# Patient Record
Sex: Female | Born: 1945 | Race: White | Hispanic: No | Marital: Single | Smoking: Never smoker
Health system: Southern US, Community
[De-identification: ages and names within clinical notes are randomized; demographics above are authoritative.]

## PROBLEM LIST (undated history)

## (undated) DIAGNOSIS — Z806 Family history of leukemia: Secondary | ICD-10-CM

## (undated) DIAGNOSIS — N83209 Unspecified ovarian cyst, unspecified side: Secondary | ICD-10-CM

## (undated) DIAGNOSIS — Z860100 Personal history of colon polyps, unspecified: Secondary | ICD-10-CM

## (undated) DIAGNOSIS — N952 Postmenopausal atrophic vaginitis: Secondary | ICD-10-CM

## (undated) DIAGNOSIS — K219 Gastro-esophageal reflux disease without esophagitis: Secondary | ICD-10-CM

## (undated) DIAGNOSIS — Z8719 Personal history of other diseases of the digestive system: Secondary | ICD-10-CM

## (undated) DIAGNOSIS — Z803 Family history of malignant neoplasm of breast: Secondary | ICD-10-CM

## (undated) DIAGNOSIS — M1990 Unspecified osteoarthritis, unspecified site: Secondary | ICD-10-CM

## (undated) DIAGNOSIS — C569 Malignant neoplasm of unspecified ovary: Secondary | ICD-10-CM

## (undated) DIAGNOSIS — I10 Essential (primary) hypertension: Secondary | ICD-10-CM

## (undated) DIAGNOSIS — K589 Irritable bowel syndrome without diarrhea: Secondary | ICD-10-CM

## (undated) DIAGNOSIS — F419 Anxiety disorder, unspecified: Secondary | ICD-10-CM

## (undated) DIAGNOSIS — K5792 Diverticulitis of intestine, part unspecified, without perforation or abscess without bleeding: Secondary | ICD-10-CM

## (undated) HISTORY — DX: Personal history of colonic polyps: Z86.010

## (undated) HISTORY — DX: Personal history of other diseases of the digestive system: Z87.19

## (undated) HISTORY — PX: ABDOMINAL HYSTERECTOMY: SHX81

## (undated) HISTORY — DX: Unspecified ovarian cyst, unspecified side: N83.209

## (undated) HISTORY — PX: LEEP: SHX91

## (undated) HISTORY — DX: Irritable bowel syndrome, unspecified: K58.9

## (undated) HISTORY — DX: Postmenopausal atrophic vaginitis: N95.2

## (undated) HISTORY — DX: Unspecified osteoarthritis, unspecified site: M19.90

## (undated) HISTORY — PX: OTHER SURGICAL HISTORY: SHX169

## (undated) HISTORY — DX: Family history of malignant neoplasm of breast: Z80.3

## (undated) HISTORY — PX: CHOLECYSTECTOMY: SHX55

## (undated) HISTORY — DX: Personal history of colon polyps, unspecified: Z86.0100

## (undated) HISTORY — PX: OOPHORECTOMY: SHX86

## (undated) HISTORY — DX: Family history of leukemia: Z80.6

## (undated) HISTORY — PX: BREAST BIOPSY: SHX20

## (undated) HISTORY — DX: Diverticulitis of intestine, part unspecified, without perforation or abscess without bleeding: K57.92

## (undated) HISTORY — PX: CERVICAL BIOPSY  W/ LOOP ELECTRODE EXCISION: SUR135

---

## 1997-07-23 HISTORY — PX: TUBAL LIGATION: SHX77

## 2015-08-16 DIAGNOSIS — Z1211 Encounter for screening for malignant neoplasm of colon: Secondary | ICD-10-CM | POA: Diagnosis not present

## 2015-08-16 DIAGNOSIS — Z1212 Encounter for screening for malignant neoplasm of rectum: Secondary | ICD-10-CM | POA: Diagnosis not present

## 2015-08-16 DIAGNOSIS — Z780 Asymptomatic menopausal state: Secondary | ICD-10-CM | POA: Diagnosis not present

## 2015-08-16 DIAGNOSIS — Z01419 Encounter for gynecological examination (general) (routine) without abnormal findings: Secondary | ICD-10-CM | POA: Diagnosis not present

## 2015-08-25 DIAGNOSIS — M8588 Other specified disorders of bone density and structure, other site: Secondary | ICD-10-CM | POA: Diagnosis not present

## 2015-08-25 DIAGNOSIS — Z780 Asymptomatic menopausal state: Secondary | ICD-10-CM | POA: Diagnosis not present

## 2015-08-25 DIAGNOSIS — M859 Disorder of bone density and structure, unspecified: Secondary | ICD-10-CM | POA: Diagnosis not present

## 2015-08-25 DIAGNOSIS — Z1231 Encounter for screening mammogram for malignant neoplasm of breast: Secondary | ICD-10-CM | POA: Diagnosis not present

## 2015-09-01 DIAGNOSIS — Z9889 Other specified postprocedural states: Secondary | ICD-10-CM | POA: Diagnosis not present

## 2015-09-01 DIAGNOSIS — R921 Mammographic calcification found on diagnostic imaging of breast: Secondary | ICD-10-CM | POA: Diagnosis not present

## 2015-09-01 DIAGNOSIS — N6489 Other specified disorders of breast: Secondary | ICD-10-CM | POA: Diagnosis not present

## 2015-11-23 DIAGNOSIS — R928 Other abnormal and inconclusive findings on diagnostic imaging of breast: Secondary | ICD-10-CM | POA: Diagnosis not present

## 2015-11-23 DIAGNOSIS — N6002 Solitary cyst of left breast: Secondary | ICD-10-CM | POA: Diagnosis not present

## 2015-11-23 DIAGNOSIS — N63 Unspecified lump in breast: Secondary | ICD-10-CM | POA: Diagnosis not present

## 2016-02-29 DIAGNOSIS — L309 Dermatitis, unspecified: Secondary | ICD-10-CM | POA: Diagnosis not present

## 2016-04-09 DIAGNOSIS — Z6833 Body mass index (BMI) 33.0-33.9, adult: Secondary | ICD-10-CM | POA: Diagnosis not present

## 2016-04-09 DIAGNOSIS — F411 Generalized anxiety disorder: Secondary | ICD-10-CM | POA: Diagnosis not present

## 2016-04-09 DIAGNOSIS — Z636 Dependent relative needing care at home: Secondary | ICD-10-CM | POA: Diagnosis not present

## 2016-04-09 DIAGNOSIS — I10 Essential (primary) hypertension: Secondary | ICD-10-CM | POA: Diagnosis not present

## 2016-04-09 DIAGNOSIS — E669 Obesity, unspecified: Secondary | ICD-10-CM | POA: Diagnosis not present

## 2016-05-01 DIAGNOSIS — Z23 Encounter for immunization: Secondary | ICD-10-CM | POA: Diagnosis not present

## 2016-06-27 DIAGNOSIS — Z1211 Encounter for screening for malignant neoplasm of colon: Secondary | ICD-10-CM | POA: Diagnosis not present

## 2016-06-27 DIAGNOSIS — N761 Subacute and chronic vaginitis: Secondary | ICD-10-CM | POA: Diagnosis not present

## 2016-06-27 DIAGNOSIS — N760 Acute vaginitis: Secondary | ICD-10-CM | POA: Diagnosis not present

## 2016-07-05 DIAGNOSIS — R8279 Other abnormal findings on microbiological examination of urine: Secondary | ICD-10-CM | POA: Diagnosis not present

## 2016-07-05 DIAGNOSIS — Z79899 Other long term (current) drug therapy: Secondary | ICD-10-CM | POA: Diagnosis not present

## 2016-07-05 DIAGNOSIS — B9629 Other Escherichia coli [E. coli] as the cause of diseases classified elsewhere: Secondary | ICD-10-CM | POA: Diagnosis not present

## 2016-07-05 DIAGNOSIS — N390 Urinary tract infection, site not specified: Secondary | ICD-10-CM | POA: Diagnosis not present

## 2016-07-05 DIAGNOSIS — R309 Painful micturition, unspecified: Secondary | ICD-10-CM | POA: Diagnosis not present

## 2016-08-24 DIAGNOSIS — N6002 Solitary cyst of left breast: Secondary | ICD-10-CM | POA: Diagnosis not present

## 2016-08-24 DIAGNOSIS — N6321 Unspecified lump in the left breast, upper outer quadrant: Secondary | ICD-10-CM | POA: Diagnosis not present

## 2016-08-24 DIAGNOSIS — R928 Other abnormal and inconclusive findings on diagnostic imaging of breast: Secondary | ICD-10-CM | POA: Diagnosis not present

## 2016-08-28 DIAGNOSIS — R300 Dysuria: Secondary | ICD-10-CM | POA: Diagnosis not present

## 2016-08-28 DIAGNOSIS — Z1212 Encounter for screening for malignant neoplasm of rectum: Secondary | ICD-10-CM | POA: Diagnosis not present

## 2016-08-28 DIAGNOSIS — Z01419 Encounter for gynecological examination (general) (routine) without abnormal findings: Secondary | ICD-10-CM | POA: Diagnosis not present

## 2016-08-28 DIAGNOSIS — Z1211 Encounter for screening for malignant neoplasm of colon: Secondary | ICD-10-CM | POA: Diagnosis not present

## 2016-10-15 DIAGNOSIS — K219 Gastro-esophageal reflux disease without esophagitis: Secondary | ICD-10-CM | POA: Diagnosis not present

## 2016-10-15 DIAGNOSIS — Z6833 Body mass index (BMI) 33.0-33.9, adult: Secondary | ICD-10-CM | POA: Diagnosis not present

## 2016-10-15 DIAGNOSIS — Z1159 Encounter for screening for other viral diseases: Secondary | ICD-10-CM | POA: Diagnosis not present

## 2016-10-15 DIAGNOSIS — I10 Essential (primary) hypertension: Secondary | ICD-10-CM | POA: Diagnosis not present

## 2016-10-15 DIAGNOSIS — E785 Hyperlipidemia, unspecified: Secondary | ICD-10-CM | POA: Diagnosis not present

## 2016-10-15 DIAGNOSIS — F411 Generalized anxiety disorder: Secondary | ICD-10-CM | POA: Diagnosis not present

## 2016-10-18 DIAGNOSIS — L304 Erythema intertrigo: Secondary | ICD-10-CM | POA: Diagnosis not present

## 2016-11-16 DIAGNOSIS — F411 Generalized anxiety disorder: Secondary | ICD-10-CM | POA: Diagnosis not present

## 2016-11-16 DIAGNOSIS — L309 Dermatitis, unspecified: Secondary | ICD-10-CM | POA: Diagnosis not present

## 2016-11-16 DIAGNOSIS — R21 Rash and other nonspecific skin eruption: Secondary | ICD-10-CM | POA: Diagnosis not present

## 2017-03-06 DIAGNOSIS — N9089 Other specified noninflammatory disorders of vulva and perineum: Secondary | ICD-10-CM | POA: Diagnosis not present

## 2017-03-21 DIAGNOSIS — K219 Gastro-esophageal reflux disease without esophagitis: Secondary | ICD-10-CM | POA: Diagnosis not present

## 2017-03-21 DIAGNOSIS — I10 Essential (primary) hypertension: Secondary | ICD-10-CM | POA: Diagnosis not present

## 2017-03-21 DIAGNOSIS — L309 Dermatitis, unspecified: Secondary | ICD-10-CM | POA: Diagnosis not present

## 2017-03-31 DIAGNOSIS — N390 Urinary tract infection, site not specified: Secondary | ICD-10-CM | POA: Diagnosis not present

## 2017-03-31 DIAGNOSIS — I10 Essential (primary) hypertension: Secondary | ICD-10-CM | POA: Diagnosis not present

## 2017-04-06 DIAGNOSIS — I10 Essential (primary) hypertension: Secondary | ICD-10-CM | POA: Diagnosis not present

## 2017-04-06 DIAGNOSIS — R309 Painful micturition, unspecified: Secondary | ICD-10-CM | POA: Diagnosis not present

## 2017-04-06 DIAGNOSIS — N390 Urinary tract infection, site not specified: Secondary | ICD-10-CM | POA: Diagnosis not present

## 2017-04-06 DIAGNOSIS — R109 Unspecified abdominal pain: Secondary | ICD-10-CM | POA: Diagnosis not present

## 2017-04-16 DIAGNOSIS — R3915 Urgency of urination: Secondary | ICD-10-CM | POA: Diagnosis not present

## 2017-04-16 DIAGNOSIS — R3911 Hesitancy of micturition: Secondary | ICD-10-CM | POA: Diagnosis not present

## 2017-04-16 DIAGNOSIS — N390 Urinary tract infection, site not specified: Secondary | ICD-10-CM | POA: Diagnosis not present

## 2017-06-11 ENCOUNTER — Encounter: Payer: Self-pay | Admitting: Emergency Medicine

## 2017-06-11 ENCOUNTER — Other Ambulatory Visit: Payer: Self-pay

## 2017-06-11 ENCOUNTER — Ambulatory Visit
Admission: EM | Admit: 2017-06-11 | Discharge: 2017-06-11 | Disposition: A | Discharge status: Home / Self Care | Payer: Medicare Other | Attending: Family Medicine | Admitting: Family Medicine

## 2017-06-11 DIAGNOSIS — R05 Cough: Secondary | ICD-10-CM | POA: Diagnosis not present

## 2017-06-11 DIAGNOSIS — J019 Acute sinusitis, unspecified: Secondary | ICD-10-CM | POA: Diagnosis not present

## 2017-06-11 HISTORY — DX: Essential (primary) hypertension: I10

## 2017-06-11 MED ORDER — DOXYCYCLINE HYCLATE 100 MG PO CAPS: 100.0000 mg | ORAL_CAPSULE | Freq: Two times a day (BID) | ORAL | 0 refills | Status: DC

## 2017-06-11 MED ORDER — HYDROCOD POLST-CPM POLST ER 10-8 MG/5ML PO SUER: 5.0000 mL | Freq: Two times a day (BID) | ORAL | 0 refills | Status: DC | PRN

## 2017-06-11 NOTE — ED Provider Notes (Signed)
[facility]    CSN: [id number] Arrival date & time: 06/11/17  1033     History   Chief Complaint Chief Complaint  Patient presents with  . Cough    congestion   HPI  71 year old female presents with the above complaints.  Patient states that she been seen for the past week.  She has had congestion, sinus pain and pressure and associated productive cough.  Her cough is severe.  She is taken several over-the-counter medications without resolution.  She states that her symptoms are worsening.  No associated fever.  No known exacerbating factors.  No other associated symptoms.  No other complaints at this time.  Past Medical History:  Diagnosis Date  . Hypertension   GERD.  Past Surgical History:  Procedure Laterality Date  . CHOLECYSTECTOMY    . LEEP    . TUBAL LIGATION  1999   OB History    No data available     Home Medications    Prior to Admission medications   Medication Sig Start Date End Date Taking? Authorizing Provider  ALPRAZolam Duanne Moron) 0.5 MG tablet Take 0.5 mg by mouth 2 (two) times daily as needed for anxiety.   Yes [provider]  cetirizine (ZYRTEC) 10 MG tablet Take 10 mg by mouth at bedtime.   Yes [provider]  diltiazem (CARDIZEM CD) 180 MG 24 hr capsule Take 180 mg by mouth daily.   Yes [provider]  nabumetone (RELAFEN) 500 MG tablet Take 500 mg by mouth 2 (two) times daily.   Yes [provider]  omeprazole (PRILOSEC) 40 MG capsule Take 40 mg by mouth daily as needed.   Yes [provider]  chlorpheniramine-HYDROcodone (TUSSIONEX PENNKINETIC ER) 10-8 MG/5ML SUER Take 5 mLs by mouth every 12 (twelve) hours as needed. 06/11/17   Coral Spikes, DO  ciclopirox (LOPROX) 0.77 % SUSP Apply 1 Applicatorful topically 2 (two) times daily.    [provider]  doxycycline (VIBRAMYCIN) 100 MG capsule Take 1 capsule (100 mg total) by mouth 2 (two) times daily. 06/11/17   Coral Spikes, DO    Family History Family History  Problem Relation Age of Onset  . Heart failure Mother   . Atrial fibrillation Mother   . Hypertension Mother   . Diabetes Father   . Heart attack Father    Social History Social History   Tobacco Use  . Smoking status: Never Smoker  . Smokeless tobacco: Never Used  Substance Use Topics  . Alcohol use: Yes    Frequency: Never    Comment: rarely  . Drug use: No   Allergies   Ivp dye [iodinated diagnostic agents]; Meloxicam; Amlodipine; Ampicillin; Cefoxitin; Estradiol; Estropipate; Olmesartan medoxomil-hctz; Sertraline; Sulfa antibiotics; and Sulfamethoxazole   Review of Systems Review of Systems  Constitutional: Negative for fever.  HENT: Positive for congestion, sinus pressure and sinus pain.   Respiratory: Positive for cough. Negative for shortness of breath.   All other systems reviewed and are negative.  Physical Exam Triage Vital Signs ED Triage Vitals [06/11/17 1049]  Enc Vitals Group     BP (!) 146/66     Pulse Rate 76     Resp 16     Temp 98.6 F (37 C)     Temp Source Oral     SpO2 100 %     Weight 192 lb (87.1 kg)     Height 5\' 3"  (1.6 m)     Head Circumference  Peak Flow      Pain Score 0     Pain Loc      Pain Edu?      Excl. in [place]?    No data found.  Updated Vital Signs BP (!) 146/66 (BP Location: Right Arm)   Pulse 76   Temp 98.6 F (37 C) (Oral)   Resp 16   Ht 5\' 3"  (1.6 m)   Wt 192 lb (87.1 kg)   SpO2 100%   BMI 34.01 kg/m   Physical Exam  Constitutional: She is oriented to person, place, and time. She appears well-developed. No distress.  HENT:  Head: Normocephalic and atraumatic.  Mild oropharyngeal erythema. Mild left maxillary sinus tenderness to palpation.  Eyes: Conjunctivae are normal. Right eye exhibits no discharge. Left eye exhibits no discharge.  Neck: Neck supple.  Cardiovascular: Normal rate and regular rhythm.  No murmur heard. Pulmonary/Chest: Effort normal and breath  sounds normal. No respiratory distress. She has no wheezes. She has no rales.  Abdominal: Soft. She exhibits no distension. There is no tenderness.  Musculoskeletal: Normal range of motion.  Lymphadenopathy:    She has no cervical adenopathy.  Neurological: She is alert and oriented to person, place, and time. She exhibits normal muscle tone.  Skin: Skin is warm. No rash noted.  Psychiatric: She has a normal mood and affect. Her behavior is normal.  Vitals reviewed.  UC Treatments / Results  Labs (all labs ordered are listed, but only abnormal results are displayed) Labs Reviewed - No data to display  EKG  EKG Interpretation None      Radiology No results found.  Procedures Procedures (including critical care time)  Medications Ordered in UC Medications - No data to display  Initial Impression / Assessment and Plan / UC Course  I have reviewed the triage vital signs and the nursing notes.  Pertinent labs & imaging results that were available during my care of the patient were reviewed by me and considered in my medical decision making (see chart for details).    71 year old female presents with rash to infection.  Appears to have sinusitis.  Also experiencing severe cough.  Treating with doxycycline and tussionex.  Final Clinical Impressions(s) / UC Diagnoses   Final diagnoses:  Acute non-recurrent sinusitis, unspecified location   ED Discharge Orders        Ordered    doxycycline (VIBRAMYCIN) 100 MG capsule  2 times daily     06/11/17 1127    chlorpheniramine-HYDROcodone (TUSSIONEX PENNKINETIC ER) 10-8 MG/5ML SUER  Every 12 hours PRN     06/11/17 1127     Controlled Substance Prescriptions [place] Controlled Substance Registry consulted? Not Applicable   Coral Spikes, DO 06/11/17 1148

## 2017-06-11 NOTE — ED Triage Notes (Signed)
Patient in today c/o 1 week history of productive cough and head congestion. Patient denies fever. Patient has tried OTC Tylenol severe sinus, Tussin cough syrup and Robitussin DM all without relief. Patient states cough is just getting worse.

## 2017-07-04 DIAGNOSIS — Z23 Encounter for immunization: Secondary | ICD-10-CM | POA: Diagnosis not present

## 2017-09-02 ENCOUNTER — Other Ambulatory Visit
Admission: RE | Admit: 2017-09-02 | Discharge: 2017-09-02 | Disposition: A | Discharge status: Home / Self Care | Payer: Medicare Other | Source: Ambulatory Visit | Attending: Family Medicine | Admitting: Family Medicine

## 2017-09-02 ENCOUNTER — Ambulatory Visit (INDEPENDENT_AMBULATORY_CARE_PROVIDER_SITE_OTHER): Payer: Medicare Other | Admitting: Family Medicine

## 2017-09-02 ENCOUNTER — Encounter: Payer: Self-pay | Admitting: Family Medicine

## 2017-09-02 VITALS — BP 134/82 | HR 77 | Resp 16 | Ht 63.0 in | Wt 175.8 lb

## 2017-09-02 DIAGNOSIS — K219 Gastro-esophageal reflux disease without esophagitis: Secondary | ICD-10-CM | POA: Diagnosis not present

## 2017-09-02 DIAGNOSIS — Z8719 Personal history of other diseases of the digestive system: Secondary | ICD-10-CM | POA: Diagnosis not present

## 2017-09-02 DIAGNOSIS — I10 Essential (primary) hypertension: Secondary | ICD-10-CM | POA: Insufficient documentation

## 2017-09-02 DIAGNOSIS — D229 Melanocytic nevi, unspecified: Secondary | ICD-10-CM | POA: Diagnosis not present

## 2017-09-02 DIAGNOSIS — M5134 Other intervertebral disc degeneration, thoracic region: Secondary | ICD-10-CM

## 2017-09-02 DIAGNOSIS — N952 Postmenopausal atrophic vaginitis: Secondary | ICD-10-CM

## 2017-09-02 DIAGNOSIS — E66811 Obesity, class 1: Secondary | ICD-10-CM

## 2017-09-02 DIAGNOSIS — K589 Irritable bowel syndrome without diarrhea: Secondary | ICD-10-CM | POA: Insufficient documentation

## 2017-09-02 DIAGNOSIS — K582 Mixed irritable bowel syndrome: Secondary | ICD-10-CM

## 2017-09-02 DIAGNOSIS — F419 Anxiety disorder, unspecified: Secondary | ICD-10-CM | POA: Insufficient documentation

## 2017-09-02 DIAGNOSIS — Z23 Encounter for immunization: Secondary | ICD-10-CM

## 2017-09-02 DIAGNOSIS — Z833 Family history of diabetes mellitus: Secondary | ICD-10-CM

## 2017-09-02 DIAGNOSIS — L501 Idiopathic urticaria: Secondary | ICD-10-CM

## 2017-09-02 DIAGNOSIS — E669 Obesity, unspecified: Secondary | ICD-10-CM | POA: Diagnosis not present

## 2017-09-02 HISTORY — DX: Personal history of other diseases of the digestive system: Z87.19

## 2017-09-02 LAB — CBC
HCT: 41.4 % (ref 35.0–47.0)
Hemoglobin: 13.7 g/dL (ref 12.0–16.0)
MCH: 27.6 pg (ref 26.0–34.0)
MCHC: 33.1 g/dL (ref 32.0–36.0)
MCV: 83.3 fL (ref 80.0–100.0)
PLATELETS: 250 10*3/uL (ref 150–440)
RBC: 4.97 MIL/uL (ref 3.80–5.20)
RDW: 16.4 % — ABNORMAL HIGH (ref 11.5–14.5)
WBC: 3.7 10*3/uL (ref 3.6–11.0)

## 2017-09-02 LAB — COMPREHENSIVE METABOLIC PANEL
ALK PHOS: 88 U/L (ref 38–126)
ALT: 19 U/L (ref 14–54)
AST: 20 U/L (ref 15–41)
Albumin: 4 g/dL (ref 3.5–5.0)
Anion gap: 7 (ref 5–15)
BILIRUBIN TOTAL: 0.9 mg/dL (ref 0.3–1.2)
BUN: 13 mg/dL (ref 6–20)
CALCIUM: 10.2 mg/dL (ref 8.9–10.3)
CO2: 26 mmol/L (ref 22–32)
CREATININE: 0.68 mg/dL (ref 0.44–1.00)
Chloride: 105 mmol/L (ref 101–111)
GFR calc non Af Amer: >60 mL/min (ref >60)
Glucose, Bld: 96 mg/dL (ref 65–99)
Potassium: 4.1 mmol/L (ref 3.5–5.1)
Sodium: 138 mmol/L (ref 135–145)
TOTAL PROTEIN: 7.2 g/dL (ref 6.5–8.1)

## 2017-09-02 LAB — LIPID PANEL
CHOLESTEROL: 188 mg/dL (ref 0–200)
HDL: 53 mg/dL (ref >40)
LDL Cholesterol: 109 mg/dL — ABNORMAL HIGH (ref 0–99)
Total CHOL/HDL Ratio: 3.5 RATIO
Triglycerides: 131 mg/dL (ref <150)
VLDL: 26 mg/dL (ref 0–40)

## 2017-09-02 LAB — TSH: TSH: 0.856 u[IU]/mL (ref 0.350–4.500)

## 2017-09-02 MED ORDER — CITALOPRAM HYDROBROMIDE 20 MG PO TABS: 20.0000 mg | ORAL_TABLET | Freq: Every day | ORAL | 3 refills | Status: DC

## 2017-09-02 MED ORDER — TETANUS-DIPHTH-ACELL PERTUSSIS 5-2.5-18.5 LF-MCG/0.5 IM SUSP: 0.5000 mL | Freq: Once | INTRAMUSCULAR | 0 refills | Status: AC

## 2017-09-02 NOTE — Progress Notes (Addendum)
Date:  09/02/2017   Name:  Carrie Roberson   DOB:  1946/06/22   MRN:  [id number]  PCP:  Adline Potter, MD    Chief Complaint: Establish Care (From [facility] ); Vaginal Atrophy (needs referral to [place] if possible ); and Diarrhea (Has bad diet as she has not cared for herself due to caregiving for dying mother. She admits that she has not eaten well. Having loose stools that are odd shapes and color. She said it is different.Denies abdominal pain and denies Nausea or Vomiting. Does have some constipation that leads to softer stools at the end. This change started back 3 months ago worsening in past 3 weeks. Denies blood or black stools. )   History of Present Illness:  This is a 72 y.o. female seen for initial visit. Moved here few months ago from [place]. Hx ischemic colitis and IBS, previously on Fibercon bid but off lately, more stress re: family illness, having more diarrhea and elongated stools lately. Had colonoscopy 2007/2008/2010, last showed small benign polyp only. EGD in 2000 showed GERD, on Prilosec since, now taking only about weekly. Hx thoracic DDD, on Relafen in past, off since Nov and doing ok, takes Tylenol prn. HTN on Cardizem CD in past, off since Nov., had lost 15# past year. Takes Zyrtec daily for itching, well controlled. Takes Xanax 0.25 mg every 2-3 days for anxiety, willing to try maintenance med, intolerant Zoloft in past. Hx atrophic vaginitis, intolerant estrogen cream in past. Lesion L wrist getting bigger. Father died 82 MI/DM, mother died 97 CHF, brother died 1 drowning, sister with OAHTN. Tet status unknown, has had pneumo imms x 2 and zoster imm 5 yrs ago.   Review of Systems:  Review of Systems  Constitutional: Negative for chills and fever.  HENT: Negative for ear pain, sinus pain and trouble swallowing.   Eyes: Negative for pain.  Respiratory: Negative for cough and shortness of breath.   Cardiovascular: Negative for chest pain and leg swelling.   Gastrointestinal: Negative for abdominal pain and blood in stool.  Genitourinary: Negative for difficulty urinating and pelvic pain.  Musculoskeletal: Negative for joint swelling.  Neurological: Negative for syncope and light-headedness.    Patient Active Problem List   Diagnosis Date Noted  . Hypertension 09/02/2017  . Anxiety 09/02/2017  . 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 FH: diabetes mellitus 09/02/2017  . 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 Atypical nevus 09/02/2017  . History of ischemic colitis 09/02/2017  . IBS (irritable bowel syndrome) 09/02/2017  . Atrophic vaginitis 09/02/2017    Prior to Admission medications   Medication Sig Start Date End Date Taking? Authorizing Provider  ALPRAZolam Duanne Moron) 0.5 MG tablet Take 0.5 mg by mouth 2 (two) times daily as needed for anxiety.   Yes [provider]  cetirizine (ZYRTEC) 10 MG tablet Take 10 mg by mouth at bedtime.   Yes [provider]  omeprazole (PRILOSEC) 40 MG capsule Take 40 mg by mouth daily as needed.   Yes [provider]  citalopram (CELEXA) 20 MG tablet Take 1 tablet (20 mg total) by mouth daily. 09/02/17   Carrie Roberson, Carrie Saxon, MD  Tdap Durwin Reges) 5-2.5-18.5 LF-MCG/0.5 injection Inject 0.5 mLs into the muscle once for 1 dose. 09/02/17 09/02/17  Adline Potter, MD    Allergies  Allergen Reactions  . Ivp Dye [Iodinated Diagnostic Agents] Swelling    hives  . Meloxicam     unknown  . Amlodipine  Rash  . Ampicillin Rash  . Cefoxitin Rash  . Estradiol Rash  . Estropipate Rash  . Olmesartan Medoxomil-Hctz Rash  . Sertraline Rash       . Sulfa Antibiotics Rash  . Sulfamethoxazole Rash    Past Surgical History:  Procedure Laterality Date  . CHOLECYSTECTOMY    . LEEP    . TUBAL LIGATION  1999    Social History   Tobacco Use  . Smoking status: Never Smoker  . Smokeless tobacco: Never Used  Substance Use Topics  . Alcohol use: Yes     Frequency: Never    Comment: rarely  . Drug use: No    Family History  Problem Relation Age of Onset  . Heart failure Mother   . Atrial fibrillation Mother   . Hypertension Mother   . Diabetes Father   . Heart attack Father     Medication list has been reviewed and updated.  Physical Examination: BP 134/82   Pulse 77   Resp 16   Ht 5\' 3"  (1.6 m)   Wt 175 lb 12.8 oz (79.7 kg)   SpO2 98%   BMI 31.14 kg/m   Physical Exam  Constitutional: She is oriented to person, place, and time. She appears well-developed and well-nourished.  HENT:  Head: Normocephalic and atraumatic.  Right Ear: External ear normal.  Left Ear: External ear normal.  Nose: Nose normal.  Mouth/Throat: Oropharynx is clear and moist.  TMs clear  Eyes: Conjunctivae and EOM are normal. Pupils are equal, round, and reactive to light.  Neck: Neck supple. No thyromegaly present.  Cardiovascular: Normal rate, regular rhythm and normal heart sounds.  Pulmonary/Chest: Effort normal and breath sounds normal.  Abdominal: Soft. She exhibits no distension and no mass. There is no tenderness.  Musculoskeletal: She exhibits no edema.  Romberg negative, gait normal  Lymphadenopathy:    She has no cervical adenopathy.  Neurological: She is alert and oriented to person, place, and time. Coordination normal.  Skin: Skin is warm and dry.  Irregular heterogenous nevus L dorsal wrist  Psychiatric: She has a normal mood and affect. Her behavior is normal.  Nursing note and vitals reviewed.   Assessment and Plan:  1. Essential hypertension Adequate control off Cardizem CD likely due to weight loss, monitor - Comprehensive Metabolic Panel (CMET) - CBC - Lipid Profile  2. Anxiety Begin Celexa 20 mg daily, use Xanax sparingly for now - TSH  3. Irritable bowel syndrome with both constipation and diarrhea Worse lately and due for colonoscopy - Ambulatory referral to Gastroenterology  4. DDD (degenerative disc  disease), thoracic Continue Tylenol prn  5. Obesity, Class I, BMI 30.0-34.9 (see actual BMI) Weight down 15# past year, exercise/further weight loss discussed  6. Atrophic vaginitis - Ambulatory referral to Gynecology  7. Atypical nevus left wrist - Ambulatory referral to Dermatology  8. Chronic idiopathic urticaria Cont daily Zyrtec  9. Gastroesophageal reflux disease, esophagitis presence not specified Improved with weight loss, cont Prilosec prn   10. History of ischemic colitis  11. FH: diabetes mellitus  12. Need for diphtheria-tetanus-pertussis (Tdap) vaccine - Tdap (BOOSTRIX) 5-2.5-18.5 LF-MCG/0.5 injection; Inject 0.5 mLs into the muscle once for 1 dose.  Dispense: 0.5 mL; Refill: 0  Return in about 4 weeks (around 09/30/2017).   One hour spent with patient over half in counseling.  Satira Anis. [facility]  09/02/2017

## 2017-09-09 ENCOUNTER — Encounter: Payer: Self-pay | Admitting: Gastroenterology

## 2017-09-09 ENCOUNTER — Other Ambulatory Visit: Payer: Self-pay

## 2017-09-09 ENCOUNTER — Ambulatory Visit (INDEPENDENT_AMBULATORY_CARE_PROVIDER_SITE_OTHER): Payer: Medicare Other | Admitting: Gastroenterology

## 2017-09-09 VITALS — BP 144/95 | HR 85 | Temp 97.7°F | Ht 63.0 in | Wt 174.8 lb

## 2017-09-09 DIAGNOSIS — K580 Irritable bowel syndrome with diarrhea: Secondary | ICD-10-CM

## 2017-09-09 MED ORDER — AMITRIPTYLINE HCL 10 MG PO TABS: 10.0000 mg | ORAL_TABLET | Freq: Every day | ORAL | 1 refills | Status: DC

## 2017-09-09 MED ORDER — DICYCLOMINE HCL 10 MG PO CAPS: 10.0000 mg | ORAL_CAPSULE | Freq: Three times a day (TID) | ORAL | 0 refills | Status: DC

## 2017-09-09 NOTE — Patient Instructions (Signed)
1. Avoid Sodas 2. Avoid artificial sweeteners, processed meats, red meat, greasy foods  Please call our office to speak with my nurse Orvilla Fus at [phone] during business hours from 8am to 4pm if you have any questions/concerns. During after hours, you will be redirected to on call GI physician. For any emergency please call 911 or go the nearest emergency room.    [name], MD [address]  [place]  [place], [place] [address]  Main: [phone]  Fax: [phone]

## 2017-09-09 NOTE — Progress Notes (Signed)
[name], MD [address]  [place]  [place], [place] [address]  Main: [phone]  Fax: [phone]    Gastroenterology Consultation  Referring Provider:     Adline Potter, MD Primary Care Physician:  Adline Potter, MD Primary Gastroenterologist:  Dr. [name] Reason for Consultation:   Altered bowel habits        HPI:   Carrie Roberson is a 72 y.o. female referred by Dr. Adline Potter, MD  for consultation & management of alternating episodes of constipation and diarrhea, variable stool caliber from formed to loose/pasty, non bloody. No BM for 2-3 days followed by 2-3 times/day. Abdominal cramps during episodes of loose stools which get better after BM. She denies n/v/bloating/abdominal pain. Her weight has been stable. Symptoms ongoing past 2 years, was taking care of 38 y/o mother, she passed away recently. Moved with her sister to [facility] from [facility] about 3 months ago. Her symptoms gotten worse since then. She attributes most of her symptoms are secondary to diet, she reports that she hasn't been eating quite good, not having 3 regular meals per day. She drinks sodas up to 2 cans per day. She in fact had to go back to [place] for about a month as she has been having hard time adjusting in a new place with her sister. She doesn't sleep well either. She denies any recent use of antibiotics.  Ischemic colitis 2007, unsure of what segment, Had repeat colonoscopy a year later, reportedly normal Last colonoscopy in 2010-2011, a small polyp was removed and she was told she is not due until 10years   NSAIDs: none  Antiplts/Anticoagulants/Anti thrombotics: none  GI Procedures: In [place], reports not available Ischemic colitis 2007 based on a colonoscopy, unsure of what segment, Had repeat colonoscopy a year later, reportedly normal Last colonoscopy in 2010-2011, a small polyp was removed and she was told she is not due until 10years  Past Medical History:    Diagnosis Date  . Hypertension   . Ovarian cyst   . Vaginal atrophy     Past Surgical History:  Procedure Laterality Date  . CHOLECYSTECTOMY    . LEEP    . TUBAL LIGATION  1999    Prior to Admission medications   Medication Sig Start Date End Date Taking? Authorizing Provider  omeprazole (PRILOSEC) 40 MG capsule Take 40 mg by mouth daily as needed.   Yes [provider]  acetaminophen (TYLENOL) 500 MG tablet Take 1,000 mg by mouth every 8 (eight) hours as needed.    [provider]  ALPRAZolam Duanne Moron) 0.5 MG tablet Take 0.5 mg by mouth 2 (two) times daily as needed for anxiety.    [provider]  cetirizine (ZYRTEC) 10 MG tablet Take 10 mg by mouth at bedtime.    [provider]  citalopram (CELEXA) 20 MG tablet Take 1 tablet (20 mg total) by mouth daily. Patient not taking: Reported on 09/09/2017 09/02/17   Adline Potter, MD    Family History  Problem Relation Age of Onset  . Heart failure Mother   . Atrial fibrillation Mother   . Hypertension Mother   . Diabetes Father   . Heart attack Father      Social History   Tobacco Use  . Smoking status: Never Smoker  . Smokeless tobacco: Never Used  Substance Use Topics  . Alcohol use: Yes    Frequency: Never    Comment: rarely  . Drug use: No  Allergies as of 09/09/2017 - Review Complete 09/09/2017  Allergen Reaction Noted  . Ivp dye [iodinated diagnostic agents] Swelling 06/11/2017  . Meloxicam  06/11/2017  . Amlodipine Rash 06/11/2017  . Ampicillin Rash 06/11/2017  . Cefoxitin Rash 06/11/2017  . Estradiol Rash 06/11/2017  . Estropipate Rash 06/11/2017  . Olmesartan medoxomil-hctz Rash 06/11/2017  . Sertraline Rash 06/11/2017  . Sulfa antibiotics Rash 06/11/2017  . Sulfamethoxazole Rash 06/11/2017    Review of Systems:    All systems reviewed and negative except where noted in HPI.   Physical Exam:  BP (!) 144/95   Pulse 85   Temp 97.7 F (36.5 C) (Oral)   Ht 5\' 3"   (1.6 m)   Wt 174 lb 12.8 oz (79.3 kg)   BMI 30.96 kg/m  No LMP recorded. Patient is postmenopausal.  General:   Alert,  Well-developed, well-nourished, pleasant and cooperative in NAD Head:  Normocephalic and atraumatic. Eyes:  Sclera clear, no icterus.   Conjunctiva pink. Ears:  Normal auditory acuity. Nose:  No deformity, discharge, or lesions. Mouth:  No deformity or lesions,oropharynx pink & moist. Neck:  Supple; no masses or thyromegaly. Lungs:  Respirations even and unlabored.  Clear throughout to auscultation.   No wheezes, crackles, or rhonchi. No acute distress. Heart:  Regular rate and rhythm; no murmurs, clicks, rubs, or gallops. Abdomen:  Normal bowel sounds. Soft, non-tender and non-distended without masses, hepatosplenomegaly or hernias noted.  No guarding or rebound tenderness.   Rectal: Not performed Msk:  Symmetrical without gross deformities. Good, equal movement & strength bilaterally. Pulses:  Normal pulses noted. Extremities:  No clubbing or edema.  No cyanosis. Neurologic:  Alert and oriented x3;  grossly normal neurologically. Skin:  Intact without significant lesions or rashes. No jaundice. Psych:  Alert and cooperative. Normal mood and affect.  Imaging Studies: No abdominal imaging done  Assessment and Plan:   Carrie Roberson is a 72 y.o. Caucasian female with no significant past medical history, presents with 2 years history of altered bowel habits, predominantly increased bowel frequency with abdominal cramps, and 3 month history of worsening of the symptoms. Patient does not have alarm signs or symptoms. Her CBC, CMP and TSH are normal. Her history is highly consistent with irritable bowel syndrome, diarrhea predominant in the setting of stress/adjustment to new place. History of ischemic colitis is a risk factor for irritable bowel syndrome. She had 3 colonoscopies reportedly the last 15 years which is reassuring and it is less likely that we are dealing with  malignancy here. Other differentials include microscopic colitis or inflammatory bowel disease or chronic parasitic infections.  - Stool studies to rule out C. Difficile and her last GI pathogens - Check CRP, stool lactoferrin, TTG IgA - Start amitriptyline 10 mg at bedtime, increase to 20 mg in 1-2 weeks if able to tolerate - dicyclomine 10 mg every 8 hours as needed - Trial of IB guard - Discussed with her about low FODMAPs - Avoid carbonated beverages and artificial sweeteners - if her symptoms persist despite the above or if her labs come back abnormal concerning for inflammation, I will perform colonoscopy with biopsies   Follow up in 4 weeks   [name], MD

## 2017-09-16 ENCOUNTER — Encounter: Payer: Self-pay | Admitting: Obstetrics and Gynecology

## 2017-09-20 ENCOUNTER — Other Ambulatory Visit: Payer: Self-pay | Admitting: Gastroenterology

## 2017-09-20 DIAGNOSIS — Q828 Other specified congenital malformations of skin: Secondary | ICD-10-CM | POA: Diagnosis not present

## 2017-09-20 DIAGNOSIS — K580 Irritable bowel syndrome with diarrhea: Secondary | ICD-10-CM | POA: Diagnosis not present

## 2017-09-20 DIAGNOSIS — D485 Neoplasm of uncertain behavior of skin: Secondary | ICD-10-CM | POA: Diagnosis not present

## 2017-09-22 LAB — C-REACTIVE PROTEIN: CRP: 2.5 mg/L (ref 0.0–4.9)

## 2017-09-22 LAB — TISSUE TRANSGLUTAMINASE, IGA

## 2017-09-23 ENCOUNTER — Encounter: Payer: Self-pay | Admitting: Obstetrics and Gynecology

## 2017-09-23 ENCOUNTER — Ambulatory Visit (INDEPENDENT_AMBULATORY_CARE_PROVIDER_SITE_OTHER): Payer: Medicare Other | Admitting: Obstetrics and Gynecology

## 2017-09-23 VITALS — BP 142/88 | HR 101 | Ht 63.0 in | Wt 177.0 lb

## 2017-09-23 DIAGNOSIS — B373 Candidiasis of vulva and vagina: Secondary | ICD-10-CM | POA: Diagnosis not present

## 2017-09-23 DIAGNOSIS — N83209 Unspecified ovarian cyst, unspecified side: Secondary | ICD-10-CM

## 2017-09-23 DIAGNOSIS — N895 Stricture and atresia of vagina: Secondary | ICD-10-CM | POA: Diagnosis not present

## 2017-09-23 DIAGNOSIS — Z1231 Encounter for screening mammogram for malignant neoplasm of breast: Secondary | ICD-10-CM | POA: Diagnosis not present

## 2017-09-23 DIAGNOSIS — B354 Tinea corporis: Secondary | ICD-10-CM | POA: Diagnosis not present

## 2017-09-23 DIAGNOSIS — N952 Postmenopausal atrophic vaginitis: Secondary | ICD-10-CM

## 2017-09-23 DIAGNOSIS — Z1239 Encounter for other screening for malignant neoplasm of breast: Secondary | ICD-10-CM

## 2017-09-23 DIAGNOSIS — B3731 Acute candidiasis of vulva and vagina: Secondary | ICD-10-CM

## 2017-09-23 MED ORDER — PRASTERONE 6.5 MG VA INST: 6.5000 mg | VAGINAL_INSERT | Freq: Every day | VAGINAL | 11 refills | Status: DC

## 2017-09-23 MED ORDER — NYSTATIN 100000 UNIT/GM EX CREA: 1.0000 "application " | TOPICAL_CREAM | Freq: Two times a day (BID) | CUTANEOUS | 1 refills | Status: DC

## 2017-09-23 MED ORDER — FLUCONAZOLE 150 MG PO TABS: 150.0000 mg | ORAL_TABLET | Freq: Once | ORAL | 0 refills | Status: AC

## 2017-09-23 NOTE — Patient Instructions (Signed)

## 2017-09-23 NOTE — Progress Notes (Signed)
Obstetrics & Gynecology Office Visit   Chief Complaint:  Chief Complaint  Patient presents with  . Gynecologic Exam  . Vaginal Atrophy    History of Present Illness: Ms. Carrie Roberson is a 72 y.o. No obstetric history on file. who LMP was No LMP recorded. Patient is postmenopausal., presents today for a problem visit.   Patient presents with a prior diagnosis of postmenopausal hypoestrogenic vulvovaginal atrophy. She had previously tried premarin cream but discontinued secondary to allergic reactions (application site burning).  She has also had problems with recurrent vaginal candida.  The patient is not sexually active.  Most recently she was prescribed clobetasol for her symptoms but she has not started this as of today.  Vaginal symptoms include local irritation.   Other associated symptoms: local irritation.Menstrual pattern: She is postmenopausal, no postmenopausal bleeding.  She denies recent antibiotic exposure, denies changes in soaps, detergents coinciding with the onset of her symptoms.    The patient did undergo CT scan in [place] and was told that she had an ovarian cyst and to mention this next time she went to her OB-GYN.  CT was obtained in October 2018.  No mention was made of the size or appearance of the cyst or laterality.  Review of Systems: Review of Systems  Constitutional: Negative for chills, fever and weight loss.  Gastrointestinal: Negative for abdominal pain.  Genitourinary: Negative for dysuria, frequency and urgency.  Skin: Positive for itching. Negative for rash.     Past Medical History:  Past Medical History:  Diagnosis Date  . Hypertension   . IBS (irritable bowel syndrome)   . Ovarian cyst   . Vaginal atrophy     Past Surgical History:  Past Surgical History:  Procedure Laterality Date  . CERVICAL BIOPSY  W/ LOOP ELECTRODE EXCISION    . CHOLECYSTECTOMY    . Colonoscopoy  2007, 2008, 2011  . LEEP    . TUBAL LIGATION  1999     Gynecologic History: No LMP recorded. Patient is postmenopausal.  Family History:  Family History  Problem Relation Age of Onset  . Heart failure Mother   . Atrial fibrillation Mother   . Hypertension Mother   . Diabetes Father   . Heart attack Father     Social History:  Social History   Socioeconomic History  . Marital status: Single    Spouse name: Not on file  . Number of children: Not on file  . Years of education: Not on file  . Highest education level: Not on file  Social Needs  . Financial resource strain: Patient refused  . Food insecurity - worry: Patient refused  . Food insecurity - inability: Patient refused  . Transportation needs - medical: Patient refused  . Transportation needs - non-medical: Patient refused  Occupational History  . Not on file  Tobacco Use  . Smoking status: Never Smoker  . Smokeless tobacco: Never Used  Substance and Sexual Activity  . Alcohol use: Yes    Frequency: Never    Comment: rarely  . Drug use: No  . Sexual activity: No    Birth control/protection: Post-menopausal  Other Topics Concern  . Not on file  Social History Narrative  . Not on file    Allergies:  Allergies  Allergen Reactions  . Ivp Dye [Iodinated Diagnostic Agents] Swelling    hives  . Meloxicam     unknown  . Amlodipine Rash  . Ampicillin Rash  . Cefoxitin Rash  .  Estradiol Rash  . Estropipate Rash  . Olmesartan Medoxomil-Hctz Rash  . Sertraline Rash       . Sulfa Antibiotics Rash  . Sulfamethoxazole Rash    Medications: Prior to Admission medications   Medication Sig Start Date End Date Taking? Authorizing Provider  acetaminophen (TYLENOL) 500 MG tablet Take 1,000 mg by mouth every 8 (eight) hours as needed.    [provider]  ALPRAZolam Duanne Moron) 0.5 MG tablet Take 0.5 mg by mouth 2 (two) times daily as needed for anxiety.    [provider]  amitriptyline (ELAVIL) 10 MG tablet Take 1 tablet (10 mg total) by mouth at  bedtime. 09/09/17 10/09/17  Lin Landsman, MD  cetirizine (ZYRTEC) 10 MG tablet Take 10 mg by mouth at bedtime.    [provider]  citalopram (CELEXA) 20 MG tablet Take 1 tablet (20 mg total) by mouth daily. Patient not taking: Reported on 09/09/2017 09/02/17   Adline Potter, MD  dicyclomine (BENTYL) 10 MG capsule Take 1 capsule (10 mg total) by mouth 3 (three) times daily before meals for 30 doses. 09/09/17 09/19/17  Lin Landsman, MD  omeprazole (PRILOSEC) 40 MG capsule Take 40 mg by mouth daily as needed.    [provider]    Physical Exam Blood pressure (!) 142/88, pulse (!) 101, height 5\' 3"  (1.6 m), weight 177 lb (80.3 kg).  No LMP recorded. Patient is postmenopausal.  General: NAD HEENT: normocephalic, anicteric Pulmonary: No increased work of breathing Genitourinary:  External: Normal external female genitalia.  Normal urethral meatus, normal Bartholin's and Skene's glands.    Vagina: atrophic vaginal mucosa, no evidence of prolapse, significant stenosis of the introitus (reports prior exams had to be done with pediatric speculum)    Adnexa: ovaries non-enlarged, no adnexal masses  Rectal: deferred  Lymphatic: no evidence of inguinal lymphadenopathy Extremities: no edema, erythema, or tenderness Neurologic: Grossly intact Psychiatric: mood appropriate, affect full  Female chaperone present for pelvic portions of the physical exam  Wet Prep: PH:  N/A Clue Cells: Negative Fungal elements: Positive Trichomonas: Negative  No results found for this or any previous visit (from the past 58 hour(s)).  Assessment: 72 y.o. with vulvovaginal atrophy as well as vaginal candida  Plan: Problem List Items Addressed This Visit      Genitourinary   Atrophic vaginitis - Primary    Other Visit Diagnoses    Vaginal candida       Relevant Medications   nystatin cream (MYCOSTATIN)   Tinea corporis       Relevant Medications   nystatin cream (MYCOSTATIN)     Cyst of ovary, unspecified laterality       Relevant Orders   US PELVIS (TRANSABDOMINAL ONLY)   Vaginal stenosis       Breast screening       Relevant Orders   MM DIGITAL SCREENING BILATERAL     1) Risk factors for bacterial vaginosis and candida infections discussed.  We discussed normal vaginal flora/microbiome.  Any factors that may alter the microbiome increase the risk of these opportunistic infections.  These include changes in pH, antibiotic exposures, diabetes, wet bathing suits etc.  We discussed that treatment is aimed at eradicating abnormal bacterial overgrowth and or yeast.  There may be some role for vaginal probiotics in restoring normal vaginal flora.   - Rx diflucan - nystatin cream for external symptoms  2) Vulvovaginal atrophy - was intolerant to Premarin cream secondary to local application site burning.  Will  trial of Intarosa.  We discussed osphenia as an alternative as well.  Will have the patient follow up in 6-8 week to assess treatment response  3) Ovarian cyst - release signed to obtain prior records.  Will obtain utlrasound at time of follow up to assess if lesion still visible and determine if smaller, stable, or increased in size.  4) A total of 30 minutes were spent in face-to-face contact with the patient during this encounter with over half of that time devoted to counseling and coordination of care.  5) Due for mammogram which was ordered at todays visit.  6) ASCCP guidelines patient over 65 does not require any further pap smears.  7)  Return in about 8 weeks (around 11/18/2017) for Medication follow up and ultrasound (sign release for [place]).    [name], MD, [facility], [facility] 09/24/2017, 5:11 PM

## 2017-09-26 ENCOUNTER — Telehealth: Payer: Self-pay

## 2017-09-26 ENCOUNTER — Other Ambulatory Visit: Payer: Self-pay

## 2017-09-26 MED ORDER — CIPROFLOXACIN HCL 500 MG PO TABS: 500.0000 mg | ORAL_TABLET | Freq: Two times a day (BID) | ORAL | 0 refills | Status: AC

## 2017-09-26 MED ORDER — FLUCONAZOLE 150 MG PO TABS: ORAL_TABLET | ORAL | 0 refills | Status: DC

## 2017-09-26 NOTE — Progress Notes (Signed)
Tati will contact patient to reschedule. Thanks [employer]

## 2017-09-26 NOTE — Telephone Encounter (Signed)
Patient has been notified of results.  rxs called to pharmacy. She will see you tomorrow for her appt.  Thanks [employer]

## 2017-09-26 NOTE — Telephone Encounter (Signed)
Tatti rescheduling appt now.  Thanks Mavrick Mcquigg

## 2017-09-27 ENCOUNTER — Ambulatory Visit: Payer: Medicare Other | Admitting: Gastroenterology

## 2017-09-30 ENCOUNTER — Ambulatory Visit (INDEPENDENT_AMBULATORY_CARE_PROVIDER_SITE_OTHER): Payer: Medicare Other | Admitting: Family Medicine

## 2017-09-30 ENCOUNTER — Encounter: Payer: Self-pay | Admitting: Family Medicine

## 2017-09-30 VITALS — BP 122/80 | HR 78 | Resp 16 | Ht 63.0 in | Wt 172.5 lb

## 2017-09-30 DIAGNOSIS — L501 Idiopathic urticaria: Secondary | ICD-10-CM | POA: Diagnosis not present

## 2017-09-30 DIAGNOSIS — F419 Anxiety disorder, unspecified: Secondary | ICD-10-CM

## 2017-09-30 DIAGNOSIS — K219 Gastro-esophageal reflux disease without esophagitis: Secondary | ICD-10-CM | POA: Diagnosis not present

## 2017-09-30 DIAGNOSIS — N952 Postmenopausal atrophic vaginitis: Secondary | ICD-10-CM

## 2017-09-30 DIAGNOSIS — E669 Obesity, unspecified: Secondary | ICD-10-CM

## 2017-09-30 DIAGNOSIS — M5134 Other intervertebral disc degeneration, thoracic region: Secondary | ICD-10-CM

## 2017-09-30 DIAGNOSIS — D229 Melanocytic nevi, unspecified: Secondary | ICD-10-CM

## 2017-09-30 DIAGNOSIS — K582 Mixed irritable bowel syndrome: Secondary | ICD-10-CM

## 2017-09-30 LAB — GI PROFILE, STOOL, PCR
Adenovirus F 40/41: NOT DETECTED
Astrovirus: NOT DETECTED
C DIFFICILE TOXIN A/B: NOT DETECTED
CAMPYLOBACTER: NOT DETECTED
Cryptosporidium: NOT DETECTED
Cyclospora cayetanensis: NOT DETECTED
ENTEROTOXIGENIC E COLI: NOT DETECTED
Entamoeba histolytica: NOT DETECTED
Enteroaggregative E coli: NOT DETECTED
Enteropathogenic E coli: DETECTED — AB
Giardia lamblia: NOT DETECTED
NOROVIRUS GI/GII: NOT DETECTED
Plesiomonas shigelloides: NOT DETECTED
ROTAVIRUS A: NOT DETECTED
SAPOVIRUS: NOT DETECTED
Salmonella: NOT DETECTED
Shiga-toxin-producing E coli: NOT DETECTED
Shigella/Enteroinvasive E coli: NOT DETECTED
Vibrio cholerae: NOT DETECTED
Vibrio: NOT DETECTED
Yersinia enterocolitica: NOT DETECTED

## 2017-09-30 LAB — C DIFFICILE, CYTOTOXIN B

## 2017-09-30 LAB — FECAL LACTOFERRIN, QUANT: LACTOFERRIN, FECAL, QUANT.: 1.96 ug/mL (ref 0.00–7.24)

## 2017-09-30 LAB — C DIFFICILE TOXINS A+B W/RFLX: C DIFFICILE TOXINS A+B, EIA: NEGATIVE

## 2017-09-30 MED ORDER — NORTRIPTYLINE HCL 10 MG PO CAPS: 10.0000 mg | ORAL_CAPSULE | Freq: Every day | ORAL | 2 refills | Status: DC

## 2017-09-30 NOTE — Patient Instructions (Signed)
Change amitriptyline to nortriptyline.

## 2017-09-30 NOTE — Progress Notes (Signed)
Date:  09/30/2017   Name:  Carrie Roberson   DOB:  1946/05/09   MRN:  [id number]  PCP:  Adline Potter, MD    Chief Complaint: Hypertension   History of Present Illness:  This is a 72 y.o. female seen for one month f/u from initial visit. Intolerant Celexa (caused n/v) but has not needed Xanax since last visit. Saw GI for IBS, started on Elavil qhs, stool studies showed enteropathic E. Coli, on Cipro, f/u next month. Saw GYN for atrophic vaginitis, mammo ordered. DDD ok on prn Tylenol, urticaria ok on daily Zyrtec, GERD on Prilosec prn, uses about every 2 weeks.  Review of Systems:  Review of Systems  Constitutional: Negative for chills and fever.  Respiratory: Negative for cough and shortness of breath.   Cardiovascular: Negative for chest pain and leg swelling.  Genitourinary: Negative for difficulty urinating.  Neurological: Negative for syncope and light-headedness.    Patient Active Problem List   Diagnosis Date Noted  . Hypertension 09/02/2017  . Anxiety 09/02/2017  . 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 FH: diabetes mellitus 09/02/2017  . 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 Atypical nevus 09/02/2017  . History of ischemic colitis 09/02/2017  . IBS (irritable bowel syndrome) 09/02/2017  . Atrophic vaginitis 09/02/2017  . Chronic idiopathic urticaria 09/02/2017  . GERD (gastroesophageal reflux disease) 09/02/2017    Prior to Admission medications   Medication Sig Start Date End Date Taking? Authorizing Provider  acetaminophen (TYLENOL) 500 MG tablet Take 1,000 mg by mouth every 8 (eight) hours as needed.   Yes [provider]  ALPRAZolam Duanne Moron) 0.5 MG tablet Take 0.5 mg by mouth 2 (two) times daily as needed for anxiety.   Yes [provider]  cetirizine (ZYRTEC) 10 MG tablet Take 10 mg by mouth at bedtime.   Yes [provider]  ciprofloxacin (CIPRO) 500 MG tablet Take 1 tablet (500 mg total) by mouth  2 (two) times daily for 7 days. 09/26/17 10/03/17 Yes Vanga, Tally Due, MD  fluconazole (DIFLUCAN) 150 MG tablet Take one now, then one 3 days later 09/26/17  Yes Vanga, Tally Due, MD  nystatin cream (MYCOSTATIN) Apply 1 application topically 2 (two) times daily. 09/23/17  Yes [name], MD  omeprazole (PRILOSEC) 40 MG capsule Take 40 mg by mouth daily as needed.   Yes [provider]  Prasterone (INTRAROSA) 6.5 MG INST Place 6.5 mg vaginally at bedtime. 09/23/17  Yes [name], MD  dicyclomine (BENTYL) 10 MG capsule Take 1 capsule (10 mg total) by mouth 3 (three) times daily before meals for 30 doses. 09/09/17 09/19/17  Lin Landsman, MD  nortriptyline (PAMELOR) 10 MG capsule Take 1 capsule (10 mg total) by mouth at bedtime. 09/30/17   Adline Potter, MD    Allergies  Allergen Reactions  . Citalopram Nausea And Vomiting  . Ivp Dye [Iodinated Diagnostic Agents] Swelling    hives  . Meloxicam     unknown  . Amlodipine Rash  . Ampicillin Rash  . Cefoxitin Rash  . Estradiol Rash  . Estropipate Rash  . Olmesartan Medoxomil-Hctz Rash  . Sertraline Rash       . Sulfa Antibiotics Rash  . Sulfamethoxazole Rash    Past Surgical History:  Procedure Laterality Date  . CERVICAL BIOPSY  W/ LOOP ELECTRODE EXCISION    . CHOLECYSTECTOMY    . Colonoscopoy  2007, 2008, 2011  . LEEP    . TUBAL LIGATION  1999    Social History   Tobacco Use  . Smoking status: Never Smoker  . Smokeless tobacco: Never Used  Substance Use Topics  . Alcohol use: Yes    Frequency: Never    Comment: rarely  . Drug use: No    Family History  Problem Relation Age of Onset  . Heart failure Mother   . Atrial fibrillation Mother   . Hypertension Mother   . Diabetes Father   . Heart attack Father     Medication list has been reviewed and updated.  Physical Examination: BP 122/80   Pulse 78   Resp 16   Ht 5\' 3"  (1.6 m)   Wt 175 lb (79.4 kg)   SpO2 98%   BMI 31.00 kg/m    Physical Exam  Constitutional: She appears well-developed and well-nourished.  Cardiovascular: Normal rate, regular rhythm and normal heart sounds.  Pulmonary/Chest: Effort normal and breath sounds normal.  Musculoskeletal: She exhibits no edema.  Neurological: She is alert.  Skin: Skin is warm and dry.  Psychiatric: She has a normal mood and affect. Her behavior is normal.  Nursing note and vitals reviewed.   Assessment and Plan:  1. Irritable bowel syndrome with both constipation and diarrhea Change Elavil to Pamelor to limit side effects over age 33, GI following  2. Gastroesophageal reflux disease, esophagitis presence not specified On Prilosec prn, consider change to H2B next visit  3. DDD (degenerative disc disease), thoracic Well controlled on Tylenol prn  4. Atypical nevus Bx by derm negative  5. Chronic idiopathic urticaria Well controlled on daily Zyrtec  6. Anxiety Intolerant SSRI, may improve on Pamelor, use Xanax sparingly  7. Atrophic vaginitis GYN following  8. Obesity, Class I, BMI 30.0-34.9 (see actual BMI) Weight stable, exercise/weight loss discussed  Return in about 3 months (around 12/31/2017).  Satira Anis. [facility]  09/30/2017

## 2017-10-02 ENCOUNTER — Telehealth: Payer: Self-pay

## 2017-10-02 NOTE — Telephone Encounter (Signed)
Called pt to sched AWV w/ [facility]. Pt is currently walking the dog and requested to call back to schedule.

## 2017-10-11 ENCOUNTER — Ambulatory Visit: Payer: Medicare Other | Admitting: Gastroenterology

## 2017-10-22 ENCOUNTER — Encounter: Payer: Self-pay | Admitting: Obstetrics and Gynecology

## 2017-10-30 ENCOUNTER — Encounter: Payer: Self-pay | Admitting: Gastroenterology

## 2017-10-30 ENCOUNTER — Ambulatory Visit (INDEPENDENT_AMBULATORY_CARE_PROVIDER_SITE_OTHER): Payer: Medicare Other | Admitting: Gastroenterology

## 2017-10-30 VITALS — BP 138/85 | HR 80 | Resp 16 | Ht 63.0 in | Wt 175.6 lb

## 2017-10-30 DIAGNOSIS — K580 Irritable bowel syndrome with diarrhea: Secondary | ICD-10-CM

## 2017-10-30 MED ORDER — NORTRIPTYLINE HCL 10 MG PO CAPS: 10.0000 mg | ORAL_CAPSULE | Freq: Every day | ORAL | 1 refills | Status: DC

## 2017-10-30 NOTE — Progress Notes (Signed)
Carrie Darby, MD [address]  [place]  [place], [place] [address]  Main: [phone]  Fax: [phone]    Gastroenterology Consultation  Referring Provider:     Adline Potter, MD Primary Care Physician:  Adline Potter, MD Primary Gastroenterologist:  Dr. Cephas Roberson Reason for Consultation:  IBS diarrhea        HPI:   Carrie Roberson is a 72 y.o. female referred by Dr. Adline Potter, MD  for consultation & management of alternating episodes of constipation and diarrhea, variable stool caliber from formed to loose/pasty, non bloody. No BM for 2-3 days followed by 2-3 times/day. Abdominal cramps during episodes of loose stools which get better after BM. She denies n/v/bloating/abdominal pain. Her weight has been stable. Symptoms ongoing past 2 years, was taking care of 96 y/o mother, she passed away recently. Moved with her sister to [facility] from [facility] about 3 months ago. Her symptoms gotten worse since then. She attributes most of her symptoms are secondary to diet, she reports that she hasn't been eating quite good, not having 3 regular meals per day. She drinks sodas up to 2 cans per day. She in fact had to go back to [place] for about a month as she has been having hard time adjusting in a new place with her sister. She doesn't sleep well either. She denies any recent use of antibiotics.  Ischemic colitis 2007, unsure of what segment, Had repeat colonoscopy a year later, reportedly normal Last colonoscopy in 2010-2011, a small polyp was removed and she was told she is not due until 10years  Follow-up visit 10/30/2017 Patient reports feeling significantly better with regards to her GI symptoms. Her workup revealed that she had Escherichia coli which was treated with Cipro for 5 days. I also started her on nortriptyline 10 mg at bedtime. She tried IB guard with no significant benefit. She currently reports that her symptoms of IBS have significantly improved. Currently having  bowel movements once every 1-2 days without diarrhea or constipation. She reports that her quality of life has significantly improved.    NSAIDs: none  Antiplts/Anticoagulants/Anti thrombotics: none  GI Procedures: In [place], reports not available Ischemic colitis 2007 based on a colonoscopy, unsure of what segment, Had repeat colonoscopy a year later, reportedly normal Last colonoscopy in 2010-2011, a small polyp was removed and she was told she is not due until 10years  Past Medical History:  Diagnosis Date  . Hypertension   . IBS (irritable bowel syndrome)   . Ovarian cyst   . Vaginal atrophy     Past Surgical History:  Procedure Laterality Date  . CERVICAL BIOPSY  W/ LOOP ELECTRODE EXCISION    . CHOLECYSTECTOMY    . Colonoscopoy  2007, 2008, 2011  . LEEP    . TUBAL LIGATION  1999    Prior to Admission medications   Medication Sig Start Date End Date Taking? Authorizing Provider  omeprazole (PRILOSEC) 40 MG capsule Take 40 mg by mouth daily as needed.   Yes [provider]  acetaminophen (TYLENOL) 500 MG tablet Take 1,000 mg by mouth every 8 (eight) hours as needed.    [provider]  ALPRAZolam Duanne Moron) 0.5 MG tablet Take 0.5 mg by mouth 2 (two) times daily as needed for anxiety.    [provider]  cetirizine (ZYRTEC) 10 MG tablet Take 10 mg by mouth at bedtime.    [provider]  citalopram (CELEXA) 20 MG tablet Take 1  tablet (20 mg total) by mouth daily. Patient not taking: Reported on 09/09/2017 09/02/17   Adline Potter, MD    Family History  Problem Relation Age of Onset  . Heart failure Mother   . Atrial fibrillation Mother   . Hypertension Mother   . Diabetes Father   . Heart attack Father      Social History   Tobacco Use  . Smoking status: Never Smoker  . Smokeless tobacco: Never Used  Substance Use Topics  . Alcohol use: Yes    Frequency: Never    Comment: rarely  . Drug use: No    Allergies as of  10/30/2017 - Review Complete 10/30/2017  Allergen Reaction Noted  . Citalopram Nausea And Vomiting 09/30/2017  . Ivp dye [iodinated diagnostic agents] Swelling 06/11/2017  . Meloxicam  06/11/2017  . Amlodipine Rash 06/11/2017  . Ampicillin Rash 06/11/2017  . Cefoxitin Rash 06/11/2017  . Estradiol Rash 06/11/2017  . Estropipate Rash 06/11/2017  . Olmesartan medoxomil-hctz Rash 06/11/2017  . Sertraline Rash 06/11/2017  . Sulfa antibiotics Rash 06/11/2017  . Sulfamethoxazole Rash 06/11/2017    Review of Systems:    All systems reviewed and negative except where noted in HPI.   Physical Exam:  BP 138/85   Pulse 80   Resp 16   Ht 5\' 3"  (1.6 m)   Wt 175 lb 9.6 oz (79.7 kg)   BMI 31.11 kg/m  No LMP recorded. Patient is postmenopausal.  General:   Alert,  Well-developed, well-nourished, pleasant and cooperative in NAD Head:  Normocephalic and atraumatic. Eyes:  Sclera clear, no icterus.   Conjunctiva pink. Ears:  Normal auditory acuity. Nose:  No deformity, discharge, or lesions. Mouth:  No deformity or lesions,oropharynx pink & moist. Neck:  Supple; no masses or thyromegaly. Lungs:  Respirations even and unlabored.  Clear throughout to auscultation.   No wheezes, crackles, or rhonchi. No acute distress. Heart:  Regular rate and rhythm; no murmurs, clicks, rubs, or gallops. Abdomen:  Normal bowel sounds. Soft, non-tender and non-distended without masses, hepatosplenomegaly or hernias noted.  No guarding or rebound tenderness.   Rectal: Not performed Msk:  Symmetrical without gross deformities. Good, equal movement & strength bilaterally. Pulses:  Normal pulses noted. Extremities:  No clubbing or edema.  No cyanosis. Neurologic:  Alert and oriented x3;  grossly normal neurologically. Skin:  Intact without significant lesions or rashes. No jaundice. Psych:  Alert and cooperative. Normal mood and affect.  Imaging Studies: No abdominal imaging done  Assessment and Plan:   Carrie Roberson is a 72 y.o. Caucasian female with no significant past medical history, presents with 2 years history of altered bowel habits, predominantly increased bowel frequency with abdominal cramps, and 3 month history of worsening of the symptoms. Patient does not have alarm signs or symptoms. Her CBC, CMP and TSH are normal. Her history is highly consistent with irritable bowel syndrome, diarrhea predominant in the setting of stress/adjustment to new place. History of ischemic colitis is a risk factor for irritable bowel syndrome. She had 3 colonoscopies in last 15 years which is reassuring and it is less likely that we are dealing with malignancy here. Workup revealed Escherichia coli infection and underwent treatment with ciprofloxacin. Fecal lactoferrin negative TTG IgA normal, CRP normal IBS symptoms have currently resolved  - continue nortriptyline 10 mg at bedtime, prescriptions filled - dicyclomine 10 mg every 8 hours as needed - continue to avoid carbonated beverages and artificial sweeteners - defer endoscopic evaluation at this time  Follow up in 3 months  Carrie Darby, MD

## 2017-11-03 ENCOUNTER — Other Ambulatory Visit: Payer: Self-pay | Admitting: Gastroenterology

## 2017-11-03 DIAGNOSIS — K580 Irritable bowel syndrome with diarrhea: Secondary | ICD-10-CM

## 2017-11-11 ENCOUNTER — Ambulatory Visit
Admission: RE | Admit: 2017-11-11 | Discharge: 2017-11-11 | Disposition: A | Discharge status: Home / Self Care | Payer: Medicare Other | Source: Ambulatory Visit | Attending: Obstetrics and Gynecology | Admitting: Obstetrics and Gynecology

## 2017-11-11 ENCOUNTER — Other Ambulatory Visit: Payer: Self-pay | Admitting: Obstetrics and Gynecology

## 2017-11-11 DIAGNOSIS — Z1239 Encounter for other screening for malignant neoplasm of breast: Secondary | ICD-10-CM

## 2017-11-11 DIAGNOSIS — Z1231 Encounter for screening mammogram for malignant neoplasm of breast: Secondary | ICD-10-CM | POA: Insufficient documentation

## 2017-11-18 ENCOUNTER — Ambulatory Visit (INDEPENDENT_AMBULATORY_CARE_PROVIDER_SITE_OTHER): Payer: Medicare Other

## 2017-11-18 ENCOUNTER — Ambulatory Visit (INDEPENDENT_AMBULATORY_CARE_PROVIDER_SITE_OTHER): Payer: Medicare Other | Admitting: Obstetrics and Gynecology

## 2017-11-18 ENCOUNTER — Encounter: Payer: Self-pay | Admitting: Obstetrics and Gynecology

## 2017-11-18 ENCOUNTER — Inpatient Hospital Stay
Admission: RE | Admit: 2017-11-18 | Discharge: 2017-11-18 | Disposition: A | Discharge status: Home / Self Care | Payer: Self-pay | Source: Ambulatory Visit | Attending: *Deleted | Admitting: *Deleted

## 2017-11-18 ENCOUNTER — Other Ambulatory Visit: Payer: Self-pay | Admitting: *Deleted

## 2017-11-18 VITALS — BP 160/86 | HR 69 | Ht 63.0 in | Wt 176.0 lb

## 2017-11-18 DIAGNOSIS — N83209 Unspecified ovarian cyst, unspecified side: Secondary | ICD-10-CM | POA: Diagnosis not present

## 2017-11-18 DIAGNOSIS — Z9289 Personal history of other medical treatment: Secondary | ICD-10-CM

## 2017-11-18 DIAGNOSIS — N83202 Unspecified ovarian cyst, left side: Secondary | ICD-10-CM

## 2017-11-18 MED ORDER — ESTROGENS, CONJUGATED 0.625 MG/GM VA CREA: 1.0000 | TOPICAL_CREAM | VAGINAL | 3 refills | Status: DC

## 2017-11-18 NOTE — Progress Notes (Signed)
Gynecology Ultrasound Follow Up  Chief Complaint:  Chief Complaint  Patient presents with  . Medication follow up  . GYN U/S     History of Present Illness: Patient is a 72 y.o. female who presents today for ultrasound evaluation of ovarian cyst noted at outside imaging per patient .  Ultrasound demonstrates the following findgins Adnexa: cyst seen 1.53cm on left ovary Uterus: non-enlarged with thin endometrial stripe <74mm with no bleeding concerns Additional: no free fluid  Review of Systems: Review of Systems  Constitutional: Negative.   HENT: Negative.   Eyes: Negative.   Respiratory: Negative.   Cardiovascular: Negative.   Gastrointestinal: Positive for constipation.       IBS symptoms  Genitourinary: Negative.   Musculoskeletal: Negative.   Skin: Negative.   Neurological: Negative.   Endo/Heme/Allergies: Negative.   Psychiatric/Behavioral: Negative.     Past Medical History:  Past Medical History:  Diagnosis Date  . Hypertension   . IBS (irritable bowel syndrome)   . Ovarian cyst   . Vaginal atrophy     Past Surgical History:  Past Surgical History:  Procedure Laterality Date  . BREAST BIOPSY Left    needle bx/clip-neg  . CERVICAL BIOPSY  W/ LOOP ELECTRODE EXCISION    . CHOLECYSTECTOMY    . Colonoscopoy  2007, 2008, 2011  . LEEP    . TUBAL LIGATION  1999    Gynecologic History:  No LMP recorded. Patient is postmenopausal. Contraception: post menopausal status Last Pap: 07/07/2016  Family History:  Family History  Problem Relation Age of Onset  . Heart failure Mother   . Atrial fibrillation Mother   . Hypertension Mother   . Diabetes Father   . Heart attack Father   . Breast cancer Maternal Aunt 70  . Breast cancer Maternal Grandmother 80  . Breast cancer Cousin        2 mat cousins    Social History:  Social History   Socioeconomic History  . Marital status: Single    Spouse name: Not on file  . Number of children: Not on file    . Years of education: Not on file  . Highest education level: Not on file  Occupational History  . Not on file  Social Needs  . Financial resource strain: Patient refused  . Food insecurity:    Worry: Patient refused    Inability: Patient refused  . Transportation needs:    Medical: Patient refused    Non-medical: Patient refused  Tobacco Use  . Smoking status: Never Smoker  . Smokeless tobacco: Never Used  Substance and Sexual Activity  . Alcohol use: Yes    Frequency: Never    Comment: rarely  . Drug use: No  . Sexual activity: Never    Birth control/protection: Post-menopausal  Lifestyle  . Physical activity:    Days per week: Patient refused    Minutes per session: Patient refused  . Stress: Not on file  Relationships  . Social connections:    Talks on phone: Patient refused    Gets together: Patient refused    Attends religious service: Patient refused    Active member of club or organization: Patient refused    Attends meetings of clubs or organizations: Patient refused    Relationship status: Patient refused  . Intimate partner violence:    Fear of current or ex partner: Patient refused    Emotionally abused: Patient refused    Physically abused: Patient refused    Forced sexual  activity: Patient refused  Other Topics Concern  . Not on file  Social History Narrative  . Not on file    Allergies:  Allergies  Allergen Reactions  . Citalopram Nausea And Vomiting  . Ivp Dye [Iodinated Diagnostic Agents] Swelling    hives  . Meloxicam     unknown  . Amlodipine Rash  . Ampicillin Rash  . Cefoxitin Rash  . Estradiol Rash  . Estropipate Rash  . Olmesartan Medoxomil-Hctz Rash  . Sertraline Rash       . Sulfa Antibiotics Rash  . Sulfamethoxazole Rash    Medications: Prior to Admission medications   Medication Sig Start Date End Date Taking? Authorizing Provider  acetaminophen (TYLENOL) 500 MG tablet Take 1,000 mg by mouth every 8 (eight) hours as  needed.   Yes [provider]  ALPRAZolam Duanne Moron) 0.5 MG tablet Take 0.5 mg by mouth 2 (two) times daily as needed for anxiety.   Yes [provider]  amitriptyline (ELAVIL) 10 MG tablet TAKE 1 TABLET BY MOUTH EVERYDAY AT BEDTIME 11/04/17  Yes Vanga, Tally Due, MD  cetirizine (ZYRTEC) 10 MG tablet Take 10 mg by mouth at bedtime.   Yes [provider]  nortriptyline (PAMELOR) 10 MG capsule Take 1 capsule (10 mg total) by mouth at bedtime. 10/30/17 04/28/18 Yes Vanga, Tally Due, MD  nystatin cream (MYCOSTATIN) Apply 1 application topically 2 (two) times daily. 09/23/17  Yes [name], MD  omeprazole (PRILOSEC) 40 MG capsule Take 40 mg by mouth daily as needed.   Yes [provider]  Prasterone (INTRAROSA) 6.5 MG INST Place 6.5 mg vaginally at bedtime. 09/23/17  Yes [name], MD  conjugated estrogens (PREMARIN) vaginal cream Place 1 Applicatorful vaginally 2 (two) times a week. 11/18/17   [name], MD  dicyclomine (BENTYL) 10 MG capsule Take 1 capsule (10 mg total) by mouth 3 (three) times daily before meals for 30 doses. 09/09/17 09/19/17  Lin Landsman, MD    Physical Exam Vitals: Blood pressure (!) 160/86, pulse 69, height 5\' 3"  (1.6 m), weight 176 lb (79.8 kg).  General: NAD HEENT: normocephalic, anicteric Pulmonary: No increased work of breathing Extremities: no edema, erythema, or tenderness Neurologic: Grossly intact, normal gait Psychiatric: mood appropriate, affect full   Assessment: 72 y.o. G0P0000 No problem-specific Assessment & Plan notes found for this encounter.   Plan: Problem List Items Addressed This Visit    None    Visit Diagnoses    Left ovarian cyst    -  Primary   Relevant Orders   US Transvaginal Non-OB      1) Simple left ovarian cyst - discussed that while not a normal finding per say, it is a common finding even in patient of postmenopausal age.  The majority of simple cysts imaged in  postmenopausal women will be cystadenomas.  Given relatively small size, low doppler flow, no ascites, septations, or excrescences the risk of malignancy is low.  Given that prior imaging is not available for review will obtain follow up ultrasound in 5 months to document stability of lesion.    2) LR1 caluclator risk of malignancy 1%  3) Mammogram done 11/11/2017 not read as of today will send message to radiology regarding read.  4) A total of 15 minutes were spent in face-to-face contact with the patient during this encounter with over half of that time devoted to counseling and coordination of care.  5) Return in about 6 months (around 05/20/2018) for f/u ultrasound.  [name], MD, [facility], [facility] 11/18/2017, 5:38 PM          Check mammogram results

## 2017-11-19 ENCOUNTER — Other Ambulatory Visit: Payer: Self-pay

## 2017-11-21 ENCOUNTER — Encounter: Payer: Self-pay | Admitting: Gastroenterology

## 2017-11-21 ENCOUNTER — Encounter: Payer: Self-pay | Admitting: Obstetrics and Gynecology

## 2017-11-21 ENCOUNTER — Other Ambulatory Visit: Payer: Self-pay | Admitting: Obstetrics and Gynecology

## 2017-11-21 MED ORDER — PRASTERONE 6.5 MG VA INST: 6.5000 mg | VAGINAL_INSERT | Freq: Every day | VAGINAL | 11 refills | Status: DC

## 2017-11-21 NOTE — Telephone Encounter (Signed)
Please advise 

## 2018-01-03 ENCOUNTER — Encounter: Payer: Self-pay | Admitting: Family Medicine

## 2018-01-03 ENCOUNTER — Ambulatory Visit: Payer: Medicare Other | Admitting: Family Medicine

## 2018-01-03 ENCOUNTER — Ambulatory Visit (INDEPENDENT_AMBULATORY_CARE_PROVIDER_SITE_OTHER): Payer: Medicare Other | Admitting: Family Medicine

## 2018-01-03 VITALS — BP 138/80 | HR 80 | Ht 63.0 in | Wt 177.0 lb

## 2018-01-03 DIAGNOSIS — I10 Essential (primary) hypertension: Secondary | ICD-10-CM | POA: Diagnosis not present

## 2018-01-03 DIAGNOSIS — M1990 Unspecified osteoarthritis, unspecified site: Secondary | ICD-10-CM | POA: Diagnosis not present

## 2018-01-03 DIAGNOSIS — K589 Irritable bowel syndrome without diarrhea: Secondary | ICD-10-CM

## 2018-01-03 NOTE — Progress Notes (Signed)
Name: Carrie Roberson   MRN: [id number]    DOB: 10/10/1945   Date:01/03/2018       Progress Note  Subjective  Chief Complaint  Chief Complaint  Patient presents with  . Irritable Bowel Syndrome    refill nortriptyline  . Arthritis    wants something for arthritis- Plonk took her off Nabumetone and told her to take Tylenol    Arthritis  Presents for follow-up visit. She reports no pain, stiffness, joint swelling or joint warmth. Affected locations include the neck (rash). Her pain is at a severity of 3/10. Pertinent negatives include no diarrhea, dry eyes, dry mouth, dysuria, fatigue, fever, pain at night, pain while resting, rash, Raynaud's syndrome, uveitis or weight loss. Compliance problems: potential.     Hypertension Currently controlled by sodiun limitation.  IBS (irritable bowel syndrome) Controlled by nortryptyline   Past Medical History:  Diagnosis Date  . Hypertension   . IBS (irritable bowel syndrome)   . Ovarian cyst   . Vaginal atrophy     Past Surgical History:  Procedure Laterality Date  . BREAST BIOPSY Left    needle bx/clip-neg  . CERVICAL BIOPSY  W/ LOOP ELECTRODE EXCISION    . CHOLECYSTECTOMY    . Colonoscopoy  2007, 2008, 2011  . LEEP    . TUBAL LIGATION  1999    Family History  Problem Relation Age of Onset  . Heart failure Mother   . Atrial fibrillation Mother   . Hypertension Mother   . Diabetes Father   . Heart attack Father   . Breast cancer Maternal Aunt 70  . Breast cancer Maternal Grandmother 80  . Breast cancer Cousin        2 mat cousins    Social History   Socioeconomic History  . Marital status: Single    Spouse name: Not on file  . Number of children: Not on file  . Years of education: Not on file  . Highest education level: Not on file  Occupational History  . Not on file  Social Needs  . Financial resource strain: Patient refused  . Food insecurity:    Worry: Patient refused    Inability: Patient refused  .  Transportation needs:    Medical: Patient refused    Non-medical: Patient refused  Tobacco Use  . Smoking status: Never Smoker  . Smokeless tobacco: Never Used  Substance and Sexual Activity  . Alcohol use: Yes    Frequency: Never    Comment: rarely  . Drug use: No  . Sexual activity: Never    Birth control/protection: Post-menopausal  Lifestyle  . Physical activity:    Days per week: Patient refused    Minutes per session: Patient refused  . Stress: Not on file  Relationships  . Social connections:    Talks on phone: Patient refused    Gets together: Patient refused    Attends religious service: Patient refused    Active member of club or organization: Patient refused    Attends meetings of clubs or organizations: Patient refused    Relationship status: Patient refused  . Intimate partner violence:    Fear of current or ex partner: Patient refused    Emotionally abused: Patient refused    Physically abused: Patient refused    Forced sexual activity: Patient refused  Other Topics Concern  . Not on file  Social History Narrative  . Not on file    Allergies  Allergen Reactions  . Citalopram Nausea And Vomiting  .  Ivp Dye [Iodinated Diagnostic Agents] Swelling    hives  . Meloxicam     unknown  . Amlodipine Rash  . Ampicillin Rash  . Cefoxitin Rash  . Estradiol Rash  . Estropipate Rash  . Olmesartan Medoxomil-Hctz Rash  . Sertraline Rash       . Sulfa Antibiotics Rash  . Sulfamethoxazole Rash    Outpatient Medications Prior to Visit  Medication Sig Dispense Refill  . acetaminophen (TYLENOL) 500 MG tablet Take 1,000 mg by mouth every 8 (eight) hours as needed.    . cetirizine (ZYRTEC) 10 MG tablet Take 10 mg by mouth at bedtime.    . conjugated estrogens (PREMARIN) vaginal cream Place 1 Applicatorful vaginally 2 (two) times a week. [place]    . nortriptyline (PAMELOR) 10 MG capsule Take 1 capsule (10 mg total) by mouth at bedtime. (Patient taking  differently: Take 10 mg by mouth at bedtime. Dr Vashti Hey) 90 capsule 1  . nystatin cream (MYCOSTATIN) Apply 1 application topically 2 (two) times daily. 30 g 1  . omeprazole (PRILOSEC) 40 MG capsule Take 40 mg by mouth daily as needed.    . ALPRAZolam (XANAX) 0.5 MG tablet Take 0.5 mg by mouth 2 (two) times daily as needed for anxiety.    Marland Kitchen amitriptyline (ELAVIL) 10 MG tablet TAKE 1 TABLET BY MOUTH EVERYDAY AT BEDTIME 30 tablet 1  . dicyclomine (BENTYL) 10 MG capsule Take 1 capsule (10 mg total) by mouth 3 (three) times daily before meals for 30 doses. 30 capsule 0  . Prasterone (INTRAROSA) 6.5 MG INST Place 6.5 mg vaginally at bedtime. 30 each 11   No facility-administered medications prior to visit.     Review of Systems  Constitutional: Negative for chills, fatigue, fever, malaise/fatigue and weight loss.  HENT: Negative for ear discharge, ear pain and sore throat.   Eyes: Negative for blurred vision.  Respiratory: Negative for cough, sputum production, shortness of breath and wheezing.   Cardiovascular: Negative for chest pain, palpitations and leg swelling.  Gastrointestinal: Negative for abdominal pain, blood in stool, constipation, diarrhea, heartburn, melena and nausea.  Genitourinary: Negative for dysuria, frequency, hematuria and urgency.  Musculoskeletal: Positive for arthritis. Negative for back pain, joint pain, joint swelling, myalgias, neck pain and stiffness.  Skin: Negative for rash.  Neurological: Negative for dizziness, tingling, sensory change, focal weakness and headaches.  Endo/Heme/Allergies: Negative for environmental allergies and polydipsia. Does not bruise/bleed easily.  Psychiatric/Behavioral: Negative for depression and suicidal ideas. The patient is not nervous/anxious and does not have insomnia.      Objective  Vitals:   01/03/18 1052  BP: 138/80  Pulse: 80  Weight: 177 lb (80.3 kg)  Height: 5\' 3"  (1.6 m)    Physical Exam  Constitutional: No  distress.  HENT:  Head: Normocephalic and atraumatic.  Right Ear: External ear normal.  Left Ear: External ear normal.  Nose: Nose normal.  Mouth/Throat: Oropharynx is clear and moist.  Eyes: Pupils are equal, round, and reactive to light. Conjunctivae and EOM are normal. Right eye exhibits no discharge. Left eye exhibits no discharge.  Neck: Normal range of motion. Neck supple. No JVD present. No thyromegaly present.  Cardiovascular: Normal rate, regular rhythm, normal heart sounds and intact distal pulses. Exam reveals no gallop and no friction rub.  No murmur heard. Pulmonary/Chest: Effort normal and breath sounds normal.  Abdominal: Soft. Bowel sounds are normal. She exhibits no mass. There is no tenderness. There is no guarding.  Musculoskeletal: Normal range of motion.  She exhibits no edema.  Lymphadenopathy:    She has no cervical adenopathy.  Neurological: She is alert. She has normal reflexes.  Skin: Skin is warm and dry. She is not diaphoretic.  Nursing note and vitals reviewed.     Assessment & Plan  Problem List Items Addressed This Visit      Cardiovascular and Mediastinum   RESOLVED: Hypertension    Currently controlled by sodiun limitation.        Digestive   IBS (irritable bowel syndrome)    Controlled by nortryptyline       Other Visit Diagnoses    Arthritis    -  Primary   contolled somewhat by tylenol      No orders of the defined types were placed in this encounter.     Dr. [facility]  01/03/18

## 2018-01-03 NOTE — Assessment & Plan Note (Signed)
Controlled by nortryptyline

## 2018-01-03 NOTE — Assessment & Plan Note (Signed)
Currently controlled by sodiun limitation.

## 2018-01-06 ENCOUNTER — Ambulatory Visit (INDEPENDENT_AMBULATORY_CARE_PROVIDER_SITE_OTHER): Payer: Medicare Other

## 2018-01-06 VITALS — BP 136/70 | HR 70 | Temp 98.4°F | Resp 12 | Ht 63.0 in | Wt 176.4 lb

## 2018-01-06 DIAGNOSIS — Z0000 Encounter for general adult medical examination without abnormal findings: Secondary | ICD-10-CM

## 2018-01-06 NOTE — Progress Notes (Signed)
Subjective:   Carrie Roberson is a 72 y.o. female who presents for an Initial Medicare Annual Wellness Visit.  Review of Systems    N/A  Cardiac Risk Factors include: advanced age (>27men, >43 women);hypertension;obesity (BMI >30kg/m2);sedentary lifestyle;family history of premature cardiovascular disease     Objective:    Today's Vitals   01/06/18 1026  BP: 136/70  Pulse: 70  Resp: 12  Temp: 98.4 F (36.9 C)  TempSrc: Oral  SpO2: 96%  Weight: 176 lb 6.4 oz (80 kg)  Height: 5\' 3"  (1.6 m)   Body mass index is 31.25 kg/m.  Advanced Directives 01/06/2018  Does Patient Have a Medical Advance Directive? No  Would patient like information on creating a medical advance directive? Yes (MAU/Ambulatory/Procedural Areas - Information given)    Current Medications (verified) Outpatient Encounter Medications as of 01/06/2018  Medication Sig  . acetaminophen (TYLENOL) 500 MG tablet Take 1,000 mg by mouth every 8 (eight) hours as needed.  . cetirizine (ZYRTEC) 10 MG tablet Take 10 mg by mouth at bedtime.  . conjugated estrogens (PREMARIN) vaginal cream Place 1 Applicatorful vaginally 2 (two) times a week. [place]  . nortriptyline (PAMELOR) 10 MG capsule Take 1 capsule (10 mg total) by mouth at bedtime. (Patient taking differently: Take 10 mg by mouth at bedtime. Dr Vashti Hey)  . nystatin cream (MYCOSTATIN) Apply 1 application topically 2 (two) times daily.  Marland Kitchen omeprazole (PRILOSEC) 40 MG capsule Take 40 mg by mouth daily as needed.   No facility-administered encounter medications on file as of 01/06/2018.     Allergies (verified) Citalopram; Ivp dye [iodinated diagnostic agents]; Meloxicam; Amlodipine; Ampicillin; Cefoxitin; Estradiol; Estropipate; Olmesartan medoxomil-hctz; Sertraline; Sulfa antibiotics; and Sulfamethoxazole   History: Past Medical History:  Diagnosis Date  . Hypertension   . IBS (irritable bowel syndrome)   . Ovarian cyst   . Vaginal atrophy    Past Surgical  History:  Procedure Laterality Date  . BREAST BIOPSY Left    needle bx/clip-neg  . CERVICAL BIOPSY  W/ LOOP ELECTRODE EXCISION    . CHOLECYSTECTOMY    . Colonoscopoy  2007, 2008, 2011  . LEEP    . TUBAL LIGATION  1999   Family History  Problem Relation Age of Onset  . Heart failure Mother   . Atrial fibrillation Mother   . Hypertension Mother   . Diabetes Father   . Heart attack Father   . Breast cancer Maternal Aunt 70  . Breast cancer Maternal Grandmother 80  . Breast cancer Cousin        2 mat cousins  . Hypertension Sister   . Hypertension Sister   . Arthritis Sister    Social History   Socioeconomic History  . Marital status: Single    Spouse name: Not on file  . Number of children: 0  . Years of education: Not on file  . Highest education level: Associate degree: academic program  Occupational History  . Occupation: Retired  Scientific laboratory technician  . Financial resource strain: Not hard at all  . Food insecurity:    Worry: Never true    Inability: Never true  . Transportation needs:    Medical: No    Non-medical: No  Tobacco Use  . Smoking status: Never Smoker  . Smokeless tobacco: Never Used  . Tobacco comment: smoking cessation materials not required  Substance and Sexual Activity  . Alcohol use: Yes    Frequency: Never    Comment: rarely  . Drug use: No  .  Sexual activity: Never    Birth control/protection: Post-menopausal  Lifestyle  . Physical activity:    Days per week: 0 days    Minutes per session: 0 min  . Stress: Not at all  Relationships  . Social connections:    Talks on phone: Patient refused    Gets together: Patient refused    Attends religious service: Patient refused    Active member of club or organization: Patient refused    Attends meetings of clubs or organizations: Patient refused    Relationship status: Patient refused  Other Topics Concern  . Not on file  Social History Narrative  . Not on file    Tobacco  Counseling Counseling given: No Comment: smoking cessation materials not required  Clinical Intake:  Pre-visit preparation completed: Yes  Pain : No/denies pain   BMI - recorded: 31.25 Nutritional Status: BMI > 30  Obese Nutritional Risks: None Diabetes: No  How often do you need to have someone help you when you read instructions, pamphlets, or other written materials from your doctor or pharmacy?: 1 - Never  Interpreter Needed?: No  Information entered by :: AEversole, LPN   Activities of Daily Living In your present state of health, do you have any difficulty performing the following activities: 01/06/2018 09/02/2017  Hearing? N N  Comment denies hearing aids -  Vision? N N  Comment wears eyeglasses; B cataracts -  Difficulty concentrating or making decisions? N N  Walking or climbing stairs? Y N  Comment L knee pain -  Dressing or bathing? N N  Doing errands, shopping? N N  Preparing Food and eating ? N -  Comment denies dentures -  Using the Toilet? N -  In the past six months, have you accidently leaked urine? N -  Do you have problems with loss of bowel control? Y -  Comment IBS -  Managing your Medications? N -  Managing your Finances? N -  Housekeeping or managing your Housekeeping? N -  Some recent data might be hidden     Immunizations and Health Maintenance Immunization History  Administered Date(s) Administered  . Influenza-Unspecified 06/22/2017  . Pneumococcal Conjugate-13 06/22/2017  . Pneumococcal-Unspecified 07/23/2013   Health Maintenance Due  Topic Date Due  . COLONOSCOPY  07/24/2015    Patient Care Team: Juline Patch, MD as PCP - General (Family Medicine) Colvin Caroli, MD as Consulting Physician (Family Medicine) Lin Landsman, MD as Consulting Physician (Gastroenterology)  Indicate any recent Medical Services you may have received from other than [facility] providers in the past year (date may be approximate).      Assessment:   This is a routine wellness examination for [facility].  Hearing/Vision screen Vision Screening Comments: Not established with a provider for annual eye exams. Declined to be referred to a provider at this time  Dietary issues and exercise activities discussed: Current Exercise Habits: The patient does not participate in regular exercise at present, Exercise limited by: None identified  Goals    . DIET - INCREASE WATER INTAKE     Recommend to drink at least 6-8 8oz glasses of water per day.      Depression Screen PHQ 2/9 Scores 01/06/2018 01/03/2018 09/02/2017  PHQ - 2 Score 0 1 2  PHQ- 9 Score 0 3 4    Fall Risk Fall Risk  01/06/2018 09/02/2017  Falls in the past year? No No  Risk for fall due to : Impaired vision;Impaired mobility -   FALL RISK  PREVENTION PERTAINING TO HOME: Is your home free of loose throw rugs in walkways, pet beds, electrical cords, etc? Yes Is there adequate lighting in your home to reduce risk of falls?  Yes Are there stairs in or around your home WITH handrails? Yes  ASSISTIVE DEVICES UTILIZED TO PREVENT FALLS: Use of a cane, walker or w/c? No Grab bars in the bathroom? No  Shower chair or a place to sit while bathing? No An elevated toilet seat or a handicapped toilet? No  Timed Get Up and Go Performed: Yes. Pt ambulated 10 feet within 10 sec. Gait stead-fast and without the use of an assistive device. No intervention required at this time. Fall risk prevention has been discussed.  Community Resource Referral:  Pt declined my offer to send [employer] Referral to Care Guide for installation of grab bars in the shower, shower chair or an elevated toilet seat.  Cognitive Function:     6CIT Screen 01/06/2018  What Year? 0 points  What month? 0 points  What time? 0 points  Count back from 20 0 points  Months in reverse 0 points  Repeat phrase 0 points  Total Score 0    Screening Tests Health Maintenance  Topic Date Due  .  COLONOSCOPY  07/24/2015  . TETANUS/TDAP  09/02/2018 (Originally 02/07/1965)  . INFLUENZA VACCINE  02/20/2018  . MAMMOGRAM  11/12/2018  . DEXA SCAN  Completed  . Hepatitis C Screening  Completed  . PNA vac Low Risk Adult  Completed    Qualifies for Shingles Vaccine? Yes. Due for Shingrix. Education has been provided regarding the importance of this vaccine. Pt has been advised to call her insurance company to determine her out of pocket expense. Advised she may also receive this vaccine at her local pharmacy or Health Dept. Verbalized acceptance and understanding.  Due for Tdap vaccine. Education has been provided regarding the importance of this vaccine. Pt has been advised she may receive this vaccine at her local pharmacy or Health Dept. Also advised to provide a copy of her vaccination record if she chooses to receive this vaccine at her local pharmacy. Verbalized acceptance and understanding.  Cancer Screenings: Lung: Low Dose CT Chest recommended if Age 70-80 years, 30 pack-year currently smoking OR have quit w/in 15years. Patient does not qualify. Breast: Up to date on Mammogram? Yes. Completed 11/11/17. Repeat every year.   Up to date of Bone Density/Dexa? Yes. Completed 08/25/15. Osteoporotic screenings no longer required Colorectal: Completed 07/23/05. Repeat every 10 years. States she is scheduled to be seen by Dr. Vashti Hey 02/2018 to further discuss need for repeat colonoscopy.  Additional Screenings: Hepatitis C Screening: Declined   Plan:  I have personally reviewed and addressed the Medicare Annual Wellness questionnaire and have noted the following in the patient's chart:  A. Medical and social history B. Use of alcohol, tobacco or illicit drugs  C. Current medications and supplements D. Functional ability and status E.  Nutritional status F.  Physical activity G. Advance directives H. List of other physicians I.  Hospitalizations, surgeries, and ER visits in previous 12  months J.  [place] such as hearing and vision if needed, cognitive and depression L. Referrals and appointments  In addition, I have reviewed and discussed with patient certain preventive protocols, quality metrics, and best practice recommendations. A written personalized care plan for preventive services as well as general preventive health recommendations were provided to patient.  Signed,  Aleatha Borer, LPN Nurse Health Advisor  MD Recommendations: Due for Shingrix. Education has been provided regarding the importance of this vaccine. Pt has been advised to call her insurance company to determine her out of pocket expense. Advised she may also receive this vaccine at her local pharmacy or Health Dept. Verbalized acceptance and understanding.  Due for Tdap vaccine. Education has been provided regarding the importance of this vaccine. Pt has been advised she may receive this vaccine at her local pharmacy or Health Dept. Also advised to provide a copy of her vaccination record if she chooses to receive this vaccine at her local pharmacy. Verbalized acceptance and understanding.

## 2018-01-06 NOTE — Patient Instructions (Signed)
Ms. Carrie Roberson , Thank you for taking time to come for your Medicare Wellness Visit. I appreciate your ongoing commitment to your health goals. Please review the following plan we discussed and let me know if I can assist you in the future.   Screening recommendations/referrals: Colorectal Screening: Please keep your appointment as scheduled with Dr. Vashti Hey Mammogram: Up to date Bone Density: Up to date  Vision and Dental Exams: Recommended annual ophthalmology exams for early detection of glaucoma and other disorders of the eye Recommended annual dental exams for proper oral hygiene  Vaccinations: Influenza vaccine: Up to date Pneumococcal vaccine: Up to date Tdap vaccine: Declined. Please call your insurance company to determine your out of pocket expense. You may also receive this vaccine at your local pharmacy or Health Dept. Shingles vaccine: Please call your insurance company to determine your out of pocket expense for the Shingrix vaccine. You may also receive this vaccine at your local pharmacy or Health Dept.  Advanced directives: Advance directive discussed with you today. I have provided a copy for you to complete at home and have notarized. Once this is complete please bring a copy in to our office so we can scan it into your chart.  Goals: Recommend to drink at least 6-8 8oz glasses of water per day.  Next appointment: Please schedule your Annual Wellness Visit with your Nurse Health Advisor in one year.  Preventive Care 74 Years and Older, Female Preventive care refers to lifestyle choices and visits with your health care provider that can promote health and wellness. What does preventive care include?  A yearly physical exam. This is also called an annual well check.  Dental exams once or twice a year.  Routine eye exams. Ask your health care provider how often you should have your eyes checked.  Personal lifestyle choices, including:  Daily care of your teeth and  gums.  Regular physical activity.  Eating a healthy diet.  Avoiding tobacco and drug use.  Limiting alcohol use.  Practicing safe sex.  Taking low-dose aspirin every day.  Taking vitamin and mineral supplements as recommended by your health care provider. What happens during an annual well check? The services and screenings done by your health care provider during your annual well check will depend on your age, overall health, lifestyle risk factors, and family history of disease. Counseling  Your health care provider may ask you questions about your:  Alcohol use.  Tobacco use.  Drug use.  Emotional well-being.  Home and relationship well-being.  Sexual activity.  Eating habits.  History of falls.  Memory and ability to understand (cognition).  Work and work Statistician.  Reproductive health. Screening  You may have the following tests or measurements:  Height, weight, and BMI.  Blood pressure.  Lipid and cholesterol levels. These may be checked every 5 years, or more frequently if you are over 26 years old.  Skin check.  Lung cancer screening. You may have this screening every year starting at age 74 if you have a 30-pack-year history of smoking and currently smoke or have quit within the past 15 years.  Fecal occult blood test (FOBT) of the stool. You may have this test every year starting at age 64.  Flexible sigmoidoscopy or colonoscopy. You may have a sigmoidoscopy every 5 years or a colonoscopy every 10 years starting at age 67.  Hepatitis C blood test.  Hepatitis B blood test.  Sexually transmitted disease (STD) testing.  Diabetes screening. This is done by  checking your blood sugar (glucose) after you have not eaten for a while (fasting). You may have this done every 1-3 years.  Bone density scan. This is done to screen for osteoporosis. You may have this done starting at age 68.  Mammogram. This may be done every 1-2 years. Talk to your  health care provider about how often you should have regular mammograms. Talk with your health care provider about your test results, treatment options, and if necessary, the need for more tests. Vaccines  Your health care provider may recommend certain vaccines, such as:  Influenza vaccine. This is recommended every year.  Tetanus, diphtheria, and acellular pertussis (Tdap, Td) vaccine. You may need a Td booster every 10 years.  Zoster vaccine. You may need this after age 51.  Pneumococcal 13-valent conjugate (PCV13) vaccine. One dose is recommended after age 28.  Pneumococcal polysaccharide (PPSV23) vaccine. One dose is recommended after age 65. Talk to your health care provider about which screenings and vaccines you need and how often you need them. This information is not intended to replace advice given to you by your health care provider. Make sure you discuss any questions you have with your health care provider. Document Released: 08/05/2015 Document Revised: 03/28/2016 Document Reviewed: 05/10/2015 Elsevier Interactive Patient Education  2017 [place] Prevention in the Home Falls can cause injuries. They can happen to people of all ages. There are many things you can do to make your home safe and to help prevent falls. What can I do on the outside of my home?  Regularly fix the edges of walkways and driveways and fix any cracks.  Remove anything that might make you trip as you walk through a door, such as a raised step or threshold.  Trim any bushes or trees on the path to your home.  Use bright outdoor lighting.  Clear any walking paths of anything that might make someone trip, such as rocks or tools.  Regularly check to see if handrails are loose or broken. Make sure that both sides of any steps have handrails.  Any raised decks and porches should have guardrails on the edges.  Have any leaves, snow, or ice cleared regularly.  Use sand or salt on walking  paths during winter.  Clean up any spills in your garage right away. This includes oil or grease spills. What can I do in the bathroom?  Use night lights.  Install grab bars by the toilet and in the tub and shower. Do not use towel bars as grab bars.  Use non-skid mats or decals in the tub or shower.  If you need to sit down in the shower, use a plastic, non-slip stool.  Keep the floor dry. Clean up any water that spills on the floor as soon as it happens.  Remove soap buildup in the tub or shower regularly.  Attach bath mats securely with double-sided non-slip rug tape.  Do not have throw rugs and other things on the floor that can make you trip. What can I do in the bedroom?  Use night lights.  Make sure that you have a light by your bed that is easy to reach.  Do not use any sheets or blankets that are too big for your bed. They should not hang down onto the floor.  Have a firm chair that has side arms. You can use this for support while you get dressed.  Do not have throw rugs and other things on the  floor that can make you trip. What can I do in the kitchen?  Clean up any spills right away.  Avoid walking on wet floors.  Keep items that you use a lot in easy-to-reach places.  If you need to reach something above you, use a strong step stool that has a grab bar.  Keep electrical cords out of the way.  Do not use floor polish or wax that makes floors slippery. If you must use wax, use non-skid floor wax.  Do not have throw rugs and other things on the floor that can make you trip. What can I do with my stairs?  Do not leave any items on the stairs.  Make sure that there are handrails on both sides of the stairs and use them. Fix handrails that are broken or loose. Make sure that handrails are as long as the stairways.  Check any carpeting to make sure that it is firmly attached to the stairs. Fix any carpet that is loose or worn.  Avoid having throw rugs at  the top or bottom of the stairs. If you do have throw rugs, attach them to the floor with carpet tape.  Make sure that you have a light switch at the top of the stairs and the bottom of the stairs. If you do not have them, ask someone to add them for you. What else can I do to help prevent falls?  Wear shoes that:  Do not have high heels.  Have rubber bottoms.  Are comfortable and fit you well.  Are closed at the toe. Do not wear sandals.  If you use a stepladder:  Make sure that it is fully opened. Do not climb a closed stepladder.  Make sure that both sides of the stepladder are locked into place.  Ask someone to hold it for you, if possible.  Clearly mark and make sure that you can see:  Any grab bars or handrails.  First and last steps.  Where the edge of each step is.  Use tools that help you move around (mobility aids) if they are needed. These include:  Canes.  Walkers.  Scooters.  Crutches.  Turn on the lights when you go into a dark area. Replace any light bulbs as soon as they burn out.  Set up your furniture so you have a clear path. Avoid moving your furniture around.  If any of your floors are uneven, fix them.  If there are any pets around you, be aware of where they are.  Review your medicines with your doctor. Some medicines can make you feel dizzy. This can increase your chance of falling. Ask your doctor what other things that you can do to help prevent falls. This information is not intended to replace advice given to you by your health care provider. Make sure you discuss any questions you have with your health care provider. Document Released: 05/05/2009 Document Revised: 12/15/2015 Document Reviewed: 08/13/2014 Elsevier Interactive Patient Education  2017 [employer].

## 2018-01-13 ENCOUNTER — Encounter: Payer: Self-pay | Admitting: Obstetrics and Gynecology

## 2018-01-14 ENCOUNTER — Other Ambulatory Visit: Payer: Self-pay | Admitting: Obstetrics and Gynecology

## 2018-01-14 MED ORDER — PRASTERONE 6.5 MG VA INST: 6.5000 mg | VAGINAL_INSERT | Freq: Every day | VAGINAL | 11 refills | Status: DC

## 2018-01-14 NOTE — Telephone Encounter (Signed)
Patient requested in the intrarosa do we have any saving's cards by chance its a continuation I believe

## 2018-01-14 NOTE — Telephone Encounter (Signed)
Advise

## 2018-03-03 ENCOUNTER — Encounter: Payer: Self-pay | Admitting: Gastroenterology

## 2018-03-03 ENCOUNTER — Ambulatory Visit (INDEPENDENT_AMBULATORY_CARE_PROVIDER_SITE_OTHER): Payer: Medicare Other | Admitting: Gastroenterology

## 2018-03-03 VITALS — BP 137/82 | HR 76 | Resp 17 | Ht 63.0 in | Wt 183.8 lb

## 2018-03-03 DIAGNOSIS — K582 Mixed irritable bowel syndrome: Secondary | ICD-10-CM

## 2018-03-03 NOTE — Progress Notes (Signed)
[name], MD [address]  [place]  [place], [place] [address]  Main: [phone]  Fax: [phone]    Gastroenterology Consultation  Referring Provider:     Adline Potter, MD Primary Care Physician:  Juline Patch, MD Primary Gastroenterologist:  Dr. [name] Reason for Consultation:  IBS diarrhea        HPI:   Carrie Roberson is a 72 y.o. female referred by Dr. Juline Patch, MD  for consultation & management of alternating episodes of constipation and diarrhea, variable stool caliber from formed to loose/pasty, non bloody. No BM for 2-3 days followed by 2-3 times/day. Abdominal cramps during episodes of loose stools which get better after BM. She denies n/v/bloating/abdominal pain. Her weight has been stable. Symptoms ongoing past 2 years, was taking care of 89 y/o mother, she passed away recently. Moved with her sister to [facility] from [facility] about 3 months ago. Her symptoms gotten worse since then. She attributes most of her symptoms are secondary to diet, she reports that she hasn't been eating quite good, not having 3 regular meals per day. She drinks sodas up to 2 cans per day. She in fact had to go back to [place] for about a month as she has been having hard time adjusting in a new place with her sister. She doesn't sleep well either. She denies any recent use of antibiotics.  Ischemic colitis 2007, unsure of what segment, Had repeat colonoscopy a year later, reportedly normal Last colonoscopy in 2010-2011, a small polyp was removed and she was told she is not due until 10years  Follow-up visit 10/30/2017 Patient reports feeling significantly better with regards to her GI symptoms. Her workup revealed that she had Escherichia coli which was treated with Cipro for 5 days. I also started her on nortriptyline 10 mg at bedtime. She tried IB guard with no significant benefit. She currently reports that her symptoms of IBS have significantly improved. Currently having  bowel movements once every 1-2 days without diarrhea or constipation. She reports that her quality of life has significantly improved.  Follow up visit 03/03/18 She has been dealing with alternating episodes of diarrhea and constipation, influenced mostly by her diet. High carb, high lactose. She gained few pounds this summer. She wants to know if she can get a colonoscopy as she is worried if she is at high risk for colon cancer due to IBS.She is taking elavil 10mg  at bedtime    NSAIDs: none  Antiplts/Anticoagulants/Anti thrombotics: none  GI Procedures: In [place], reports not available Ischemic colitis 2007 based on a colonoscopy, unsure of what segment, Had repeat colonoscopy a year later, reportedly normal Last colonoscopy in 2010-2011, a small polyp was removed and she was told she is not due until 10years  Past Medical History:  Diagnosis Date  . Hypertension   . IBS (irritable bowel syndrome)   . Ovarian cyst   . Vaginal atrophy     Past Surgical History:  Procedure Laterality Date  . BREAST BIOPSY Left    needle bx/clip-neg  . CERVICAL BIOPSY  W/ LOOP ELECTRODE EXCISION    . CHOLECYSTECTOMY    . Colonoscopoy  2007, 2008, 2011  . LEEP    . TUBAL LIGATION  1999    Current Outpatient Medications:  .  acetaminophen (TYLENOL) 500 MG tablet, Take 1,000 mg by mouth every 8 (eight) hours as needed., Disp: , Rfl:  .  cetirizine (ZYRTEC) 10 MG tablet, Take 10  mg by mouth at bedtime., Disp: , Rfl:  .  nortriptyline (PAMELOR) 10 MG capsule, Take 1 capsule (10 mg total) by mouth at bedtime. (Patient taking differently: Take 10 mg by mouth at bedtime. Dr Vashti Hey), Disp: 90 capsule, Rfl: 1 .  nystatin cream (MYCOSTATIN), Apply 1 application topically 2 (two) times daily., Disp: 30 g, Rfl: 1 .  omeprazole (PRILOSEC) 40 MG capsule, Take 40 mg by mouth daily as needed., Disp: , Rfl:  .  Prasterone (INTRAROSA) 6.5 MG INST, Place 6.5 mg vaginally at bedtime., Disp: 30 each, Rfl:  11    Family History  Problem Relation Age of Onset  . Heart failure Mother   . Atrial fibrillation Mother   . Hypertension Mother   . Diabetes Father   . Heart attack Father   . Breast cancer Maternal Aunt 70  . Breast cancer Maternal Grandmother 80  . Breast cancer Cousin        2 mat cousins  . Hypertension Sister   . Hypertension Sister   . Arthritis Sister      Social History   Tobacco Use  . Smoking status: Never Smoker  . Smokeless tobacco: Never Used  . Tobacco comment: smoking cessation materials not required  Substance Use Topics  . Alcohol use: Yes    Frequency: Never    Comment: rarely  . Drug use: No    Allergies as of 03/03/2018 - Review Complete 03/03/2018  Allergen Reaction Noted  . Citalopram Nausea And Vomiting 09/30/2017  . Ivp dye [iodinated diagnostic agents] Swelling 06/11/2017  . Meloxicam  06/11/2017  . Amlodipine Rash 06/11/2017  . Ampicillin Rash 06/11/2017  . Cefoxitin Rash 06/11/2017  . Estradiol Rash 06/11/2017  . Estropipate Rash 06/11/2017  . Olmesartan medoxomil-hctz Rash 06/11/2017  . Sertraline Rash 06/11/2017  . Sulfa antibiotics Rash 06/11/2017  . Sulfamethoxazole Rash 06/11/2017    Review of Systems:    All systems reviewed and negative except where noted in HPI.   Physical Exam:  BP 137/82 (BP Location: Left Arm, Patient Position: Sitting, Cuff Size: Large)   Pulse 76   Resp 17   Ht 5\' 3"  (1.6 m)   Wt 183 lb 12.8 oz (83.4 kg)   BMI 32.56 kg/m  No LMP recorded. Patient is postmenopausal.  General:   Alert,  Well-developed, well-nourished, pleasant and cooperative in NAD Head:  Normocephalic and atraumatic. Eyes:  Sclera clear, no icterus.   Conjunctiva pink. Ears:  Normal auditory acuity. Nose:  No deformity, discharge, or lesions. Mouth:  No deformity or lesions,oropharynx pink & moist. Neck:  Supple; no masses or thyromegaly. Lungs:  Respirations even and unlabored.  Clear throughout to auscultation.   No  wheezes, crackles, or rhonchi. No acute distress. Heart:  Regular rate and rhythm; no murmurs, clicks, rubs, or gallops. Abdomen:  Normal bowel sounds. Soft, non-tender and non-distended without masses, hepatosplenomegaly or hernias noted.  No guarding or rebound tenderness.   Rectal: Not performed Msk:  Symmetrical without gross deformities. Good, equal movement & strength bilaterally. Pulses:  Normal pulses noted. Extremities:  No clubbing or edema.  No cyanosis. Neurologic:  Alert and oriented x3;  grossly normal neurologically. Skin:  Intact without significant lesions or rashes. No jaundice. Psych:  Alert and cooperative. Normal mood and affect.  Imaging Studies: No abdominal imaging done  Assessment and Plan:   Novaleigh Kohlman is a 72 y.o. Caucasian female with no significant past medical history, presents with 2 years history of altered bowel habits, predominantly  increased bowel frequency with abdominal cramps, and 3 month history of worsening of the symptoms. Patient does not have alarm signs or symptoms. Her CBC, CMP and TSH are normal. Her history is highly consistent with irritable bowel syndrome, diarrhea predominant in the setting of stress/adjustment to new place. History of ischemic colitis is a risk factor for irritable bowel syndrome. She had 3 colonoscopies in last 15 years which is reassuring and it is less likely that we are dealing with malignancy here. Workup revealed Escherichia coli infection and underwent treatment with ciprofloxacin. Fecal lactoferrin negative TTG IgA normal, CRP normal IBS symptoms are mixed type at present, mostly influenced by her diet and symptoms are manageable with diet adjustment   - continue nortriptyline 10 mg at bedtime - continue to avoid carbonated beverages and artificial sweeteners - Trial of IB guard  Colon cancer surveillance: - Reassured her that IBS does not increase the risk for colon cancer or polyps - Will try to obtain her last  colonoscopy report - Tentatively plan for colonoscopy end of 2019   Follow up in 3 months  [name], MD

## 2018-03-25 ENCOUNTER — Other Ambulatory Visit: Payer: Self-pay | Admitting: Obstetrics and Gynecology

## 2018-03-25 NOTE — Telephone Encounter (Signed)
Pt has Medicare, please advise

## 2018-04-08 ENCOUNTER — Other Ambulatory Visit: Payer: Self-pay

## 2018-04-08 ENCOUNTER — Other Ambulatory Visit: Payer: Self-pay | Admitting: Obstetrics and Gynecology

## 2018-04-08 ENCOUNTER — Encounter: Payer: Self-pay | Admitting: Gastroenterology

## 2018-04-08 DIAGNOSIS — Z86010 Personal history of colonic polyps: Secondary | ICD-10-CM

## 2018-04-08 MED ORDER — FLUCONAZOLE 150 MG PO TABS: 150.0000 mg | ORAL_TABLET | Freq: Once | ORAL | 0 refills | Status: AC

## 2018-05-05 ENCOUNTER — Ambulatory Visit: Payer: Medicare Other | Admitting: Anesthesiology

## 2018-05-05 ENCOUNTER — Encounter: Payer: Self-pay | Admitting: Gastroenterology

## 2018-05-05 ENCOUNTER — Ambulatory Visit
Admission: RE | Admit: 2018-05-05 | Discharge: 2018-05-05 | Disposition: A | Discharge status: Home / Self Care | Payer: Medicare Other | Source: Ambulatory Visit | Attending: Gastroenterology | Admitting: Gastroenterology

## 2018-05-05 ENCOUNTER — Other Ambulatory Visit: Payer: Self-pay

## 2018-05-05 ENCOUNTER — Encounter
Admission: RE | Disposition: A | Discharge status: Home / Self Care | Payer: Self-pay | Source: Ambulatory Visit | Attending: Gastroenterology

## 2018-05-05 DIAGNOSIS — D124 Benign neoplasm of descending colon: Secondary | ICD-10-CM | POA: Diagnosis not present

## 2018-05-05 DIAGNOSIS — Z79899 Other long term (current) drug therapy: Secondary | ICD-10-CM | POA: Diagnosis not present

## 2018-05-05 DIAGNOSIS — K589 Irritable bowel syndrome without diarrhea: Secondary | ICD-10-CM | POA: Insufficient documentation

## 2018-05-05 DIAGNOSIS — D120 Benign neoplasm of cecum: Secondary | ICD-10-CM | POA: Diagnosis not present

## 2018-05-05 DIAGNOSIS — K219 Gastro-esophageal reflux disease without esophagitis: Secondary | ICD-10-CM | POA: Diagnosis not present

## 2018-05-05 DIAGNOSIS — D122 Benign neoplasm of ascending colon: Secondary | ICD-10-CM | POA: Diagnosis not present

## 2018-05-05 DIAGNOSIS — I10 Essential (primary) hypertension: Secondary | ICD-10-CM | POA: Diagnosis not present

## 2018-05-05 DIAGNOSIS — K635 Polyp of colon: Secondary | ICD-10-CM | POA: Diagnosis not present

## 2018-05-05 DIAGNOSIS — Z1211 Encounter for screening for malignant neoplasm of colon: Secondary | ICD-10-CM | POA: Diagnosis not present

## 2018-05-05 DIAGNOSIS — Z86010 Personal history of colonic polyps: Secondary | ICD-10-CM

## 2018-05-05 HISTORY — PX: COLONOSCOPY WITH PROPOFOL: SHX5780

## 2018-05-05 SURGERY — COLONOSCOPY WITH PROPOFOL
Anesthesia: General

## 2018-05-05 MED ORDER — LIDOCAINE HCL (PF) 2 % IJ SOLN
INTRAMUSCULAR | Status: AC
  Filled 2018-05-05: qty 10

## 2018-05-05 MED ORDER — PROPOFOL 500 MG/50ML IV EMUL
INTRAVENOUS | Status: DC | PRN
  Administered 2018-05-05: 120 ug/kg/min via INTRAVENOUS

## 2018-05-05 MED ORDER — SODIUM CHLORIDE 0.9 % IV SOLN
INTRAVENOUS | Status: DC
  Administered 2018-05-05: 08:00:00 via INTRAVENOUS

## 2018-05-05 MED ORDER — PROPOFOL 10 MG/ML IV BOLUS
INTRAVENOUS | Status: DC | PRN
  Administered 2018-05-05 (×3): 41.5 mg via INTRAVENOUS

## 2018-05-05 MED ORDER — PROPOFOL 500 MG/50ML IV EMUL
INTRAVENOUS | Status: AC
  Filled 2018-05-05: qty 50

## 2018-05-05 NOTE — H&P (Signed)
[name], MD [address]  [place]  [place], [place] [address]  Main: [phone]  Fax: [phone] Pager: [phone]  Primary Care Physician:  Juline Patch, MD Primary Gastroenterologist:  Dr. [name]  Pre-Procedure History & Physical: HPI:  Carrie Roberson is a 72 y.o. female is here for an colonoscopy.   Past Medical History:  Diagnosis Date  . Hypertension   . IBS (irritable bowel syndrome)   . Ovarian cyst   . Vaginal atrophy     Past Surgical History:  Procedure Laterality Date  . BREAST BIOPSY Left    needle bx/clip-neg  . CERVICAL BIOPSY  W/ LOOP ELECTRODE EXCISION    . CHOLECYSTECTOMY    . Colonoscopoy  2007, 2008, 2011  . LEEP    . TUBAL LIGATION  1999    Prior to Admission medications   Medication Sig Start Date End Date Taking? Authorizing Provider  acetaminophen (TYLENOL) 500 MG tablet Take 1,000 mg by mouth every 8 (eight) hours as needed.   Yes [provider]  cetirizine (ZYRTEC) 10 MG tablet Take 10 mg by mouth at bedtime.   Yes [provider]  nystatin cream (MYCOSTATIN) Apply 1 application topically 2 (two) times daily. 09/23/17  Yes [name], MD  omeprazole (PRILOSEC) 40 MG capsule Take 40 mg by mouth daily as needed.   Yes [provider]  Prasterone (INTRAROSA) 6.5 MG INST Place 6.5 mg vaginally at bedtime. 01/14/18  Yes [name], MD  nortriptyline (PAMELOR) 10 MG capsule Take 1 capsule (10 mg total) by mouth at bedtime. Patient taking differently: Take 10 mg by mouth at bedtime. Dr Vashti Hey 10/30/17 04/28/18  Lin Landsman, MD    Allergies as of 04/08/2018 - Review Complete 03/03/2018  Allergen Reaction Noted  . Citalopram Nausea And Vomiting 09/30/2017  . Ivp dye [iodinated diagnostic agents] Swelling 06/11/2017  . Meloxicam  06/11/2017  . Amlodipine Rash 06/11/2017  . Ampicillin Rash 06/11/2017  . Cefoxitin Rash 06/11/2017  . Estradiol Rash 06/11/2017  . Estropipate Rash  06/11/2017  . Olmesartan medoxomil-hctz Rash 06/11/2017  . Sertraline Rash 06/11/2017  . Sulfa antibiotics Rash 06/11/2017  . Sulfamethoxazole Rash 06/11/2017    Family History  Problem Relation Age of Onset  . Heart failure Mother   . Atrial fibrillation Mother   . Hypertension Mother   . Diabetes Father   . Heart attack Father   . Breast cancer Maternal Aunt 70  . Breast cancer Maternal Grandmother 80  . Breast cancer Cousin        2 mat cousins  . Hypertension Sister   . Hypertension Sister   . Arthritis Sister     Social History   Socioeconomic History  . Marital status: Single    Spouse name: Not on file  . Number of children: 0  . Years of education: Not on file  . Highest education level: Associate degree: academic program  Occupational History  . Occupation: Retired  Scientific laboratory technician  . Financial resource strain: Not hard at all  . Food insecurity:    Worry: Never true    Inability: Never true  . Transportation needs:    Medical: No    Non-medical: No  Tobacco Use  . Smoking status: Never Smoker  . Smokeless tobacco: Never Used  . Tobacco comment: smoking cessation materials not required  Substance and Sexual Activity  . Alcohol use: Yes    Frequency: Never    Comment: rarely  . Drug  use: No  . Sexual activity: Never    Birth control/protection: Post-menopausal  Lifestyle  . Physical activity:    Days per week: 0 days    Minutes per session: 0 min  . Stress: Not at all  Relationships  . Social connections:    Talks on phone: Patient refused    Gets together: Patient refused    Attends religious service: Patient refused    Active member of club or organization: Patient refused    Attends meetings of clubs or organizations: Patient refused    Relationship status: Patient refused  . Intimate partner violence:    Fear of current or ex partner: No    Emotionally abused: No    Physically abused: No    Forced sexual activity: No  Other Topics Concern   . Not on file  Social History Narrative  . Not on file    Review of Systems: See HPI, otherwise negative ROS  Physical Exam: BP (!) 163/81   Pulse 82   Temp 98.5 F (36.9 C) (Tympanic)   Resp 16   Ht 5\' 3"  (1.6 m)   Wt 83 kg   SpO2 97%   BMI 32.42 kg/m  General:   Alert,  pleasant and cooperative in NAD Head:  Normocephalic and atraumatic. Neck:  Supple; no masses or thyromegaly. Lungs:  Clear throughout to auscultation.    Heart:  Regular rate and rhythm. Abdomen:  Soft, nontender and nondistended. Normal bowel sounds, without guarding, and without rebound.   Neurologic:  Alert and  oriented x4;  grossly normal neurologically.  Impression/Plan: Carrie Roberson is here for an colonoscopy to be performed for colon cancer surveillance   Risks, benefits, limitations, and alternatives regarding  colonoscopy have been reviewed with the patient.  Questions have been answered.  All parties agreeable.   Sherri Sear, MD  05/05/2018, 8:33 AM

## 2018-05-05 NOTE — Anesthesia Post-op Follow-up Note (Signed)
Anesthesia QCDR form completed.        

## 2018-05-05 NOTE — Op Note (Signed)
[facility] Gastroenterology Patient Name: Carrie Roberson Procedure Date: 05/05/2018 7:14 AM MRN: [id number] Account #: [id number] Date of Birth: 1945/09/07 Admit Type: Outpatient Age: 72 Room: [facility] ENDO ROOM 2 Gender: Female Note Status: Finalized Procedure:            Colonoscopy Indications:          Screening for colorectal malignant neoplasm Providers:            Lin Landsman MD, MD Referring MD:         Juline Patch, MD (Referring MD) Medicines:            Monitored Anesthesia Care Complications:        No immediate complications. Estimated blood loss:                        Minimal. Procedure:            Pre-Anesthesia Assessment:                       - Prior to the procedure, a History and Physical was                        performed, and patient medications and allergies were                        reviewed. The patient is competent. The risks and                        benefits of the procedure and the sedation options and                        risks were discussed with the patient. All questions                        were answered and informed consent was obtained.                        Patient identification and proposed procedure were                        verified by the physician, the nurse, the                        anesthesiologist, the anesthetist and the technician in                        the pre-procedure area in the procedure room in the                        endoscopy suite. Mental Status Examination: alert and                        oriented. Airway Examination: normal oropharyngeal                        airway and neck mobility. Respiratory Examination:                        clear to auscultation. CV Examination: normal.  Prophylactic Antibiotics: The patient does not require                        prophylactic antibiotics. Prior Anticoagulants: The                        patient has taken no previous  anticoagulant or                        antiplatelet agents. ASA Grade Assessment: II - A                        patient with mild systemic disease. After reviewing the                        risks and benefits, the patient was deemed in                        satisfactory condition to undergo the procedure. The                        anesthesia plan was to use monitored anesthesia care                        (MAC). Immediately prior to administration of                        medications, the patient was re-assessed for adequacy                        to receive sedatives. The heart rate, respiratory rate,                        oxygen saturations, blood pressure, adequacy of                        pulmonary ventilation, and response to care were                        monitored throughout the procedure. The physical status                        of the patient was re-assessed after the procedure.                       After obtaining informed consent, the colonoscope was                        passed under direct vision. Throughout the procedure,                        the patient's blood pressure, pulse, and oxygen                        saturations were monitored continuously. The                        Colonoscope was introduced through the anus and  advanced to the the cecum, identified by appendiceal                        orifice and ileocecal valve. The colonoscopy was                        performed without difficulty. The patient tolerated the                        procedure well. The quality of the bowel preparation                        was evaluated using the BBPS [facility] Bowel Preparation                        Scale) with scores of: Right Colon = 3, Transverse                        Colon = 3 and Left Colon = 3 (entire mucosa seen well                        with no residual staining, small fragments of stool or                        opaque liquid).  The total BBPS score equals 9. Findings:      The perianal and digital rectal examinations were normal. Pertinent       negatives include normal sphincter tone and no palpable rectal lesions.      Four sessile polyps were found in the descending colon, ascending colon       and cecum. The polyps were 3 to 5 mm in size. These polyps were removed       with a cold snare. Resection and retrieval were complete.      Normal mucosa was found in the entire colon. Biopsies for histology were       taken with a cold forceps from the entire colon for evaluation of       microscopic colitis.      The retroflexed view of the distal rectum and anal verge was normal and       showed no anal or rectal abnormalities. Impression:           - Four 3 to 5 mm polyps in the descending colon, in the                        ascending colon and in the cecum, removed with a cold                        snare. Resected and retrieved.                       - Normal mucosa in the entire examined colon. Biopsied.                       - The distal rectum and anal verge are normal on                        retroflexion view. Recommendation:       - Discharge patient to  home (with escort).                       - Resume previous diet today.                       - Continue present medications.                       - Await pathology results.                       - Repeat colonoscopy in 3 - 5 years for surveillance of                        multiple polyps.                       - Return to my office as previously scheduled. Procedure Code(s):    --- Professional ---                       [phone], Colonoscopy, flexible; with removal of tumor(s),                        polyp(s), or other lesion(s) by snare technique                       [id number], 95, Colonoscopy, flexible; with biopsy, single                        or multiple Diagnosis Code(s):    --- Professional ---                       Z12.11, Encounter for screening  for malignant neoplasm                        of colon                       D12.4, Benign neoplasm of descending colon                       D12.2, Benign neoplasm of ascending colon                       D12.0, Benign neoplasm of cecum CPT copyright 2018 American Medical Association. All rights reserved. The codes documented in this report are preliminary and upon coder review may  be revised to meet current compliance requirements. Dr. Ulyess Mort Lin Landsman MD, MD 05/05/2018 9:09:07 AM This report has been signed electronically. Number of Addenda: 0 Note Initiated On: 05/05/2018 7:14 AM Scope Withdrawal Time: 0 hours 16 minutes 22 seconds  Total Procedure Duration: 0 hours 21 minutes 35 seconds       [facility]

## 2018-05-05 NOTE — Anesthesia Preprocedure Evaluation (Addendum)
Anesthesia Evaluation  Patient identified by MRN, date of birth, ID band Patient awake    Reviewed: Allergy & Precautions, NPO status , Patient's Chart, lab work & pertinent test results  History of Anesthesia Complications Negative for: history of anesthetic complications  Airway Mallampati: III       Dental   Pulmonary neg sleep apnea, neg COPD,           Cardiovascular (-) hypertension (previous hx off meds now)(-) Past MI and (-) CHF (-) dysrhythmias (-) Valvular Problems/Murmurs     Neuro/Psych neg Seizures Anxiety    GI/Hepatic Neg liver ROS, GERD  Medicated and Controlled,  Endo/Other  neg diabetes  Renal/GU negative Renal ROS     Musculoskeletal   Abdominal   Peds  Hematology   Anesthesia Other Findings   Reproductive/Obstetrics                            Anesthesia Physical Anesthesia Plan  ASA: II  Anesthesia Plan: General   Post-op Pain Management:    Induction:   PONV Risk Score and Plan: 3 and TIVA, Propofol infusion and Treatment may vary due to age or medical condition  Airway Management Planned: Nasal Cannula  Additional Equipment:   Intra-op Plan:   Post-operative Plan:   Informed Consent: I have reviewed the patients History and Physical, chart, labs and discussed the procedure including the risks, benefits and alternatives for the proposed anesthesia with the patient or authorized representative who has indicated his/her understanding and acceptance.     Plan Discussed with:   Anesthesia Plan Comments:         Anesthesia Quick Evaluation

## 2018-05-05 NOTE — Transfer of Care (Signed)
Immediate Anesthesia Transfer of Care Note  Patient: [name]  Procedure(s) Performed: COLONOSCOPY WITH PROPOFOL (N/A )  Patient Location: PACU and Endoscopy Unit  Anesthesia Type:General  Level of Consciousness: sedated  Airway & Oxygen Therapy: Patient Spontanous Breathing  Post-op Assessment: Report given to RN and Post -op Vital signs reviewed and stable  Post vital signs: stable  Last Vitals:  Vitals Value Taken Time  BP    Temp    Pulse    Resp    SpO2      Last Pain:  Vitals:   05/05/18 0748  TempSrc: Tympanic  PainSc: 0-No pain         Complications: No apparent anesthesia complications

## 2018-05-05 NOTE — Anesthesia Postprocedure Evaluation (Signed)
Anesthesia Post Note  Patient: Carrie Roberson  Procedure(s) Performed: COLONOSCOPY WITH PROPOFOL (N/A )  Patient location during evaluation: Endoscopy Anesthesia Type: General Level of consciousness: awake and alert Pain management: pain level controlled Vital Signs Assessment: post-procedure vital signs reviewed and stable Respiratory status: spontaneous breathing and respiratory function stable Cardiovascular status: stable Anesthetic complications: no     Last Vitals:  Vitals:   05/05/18 0748 05/05/18 0912  BP: (!) 163/81 123/89  Pulse: 82 79  Resp: 16 18  Temp: 36.9 C (!) 36.1 C  SpO2: 97% 94%    Last Pain:  Vitals:   05/05/18 0912  TempSrc: Tympanic  PainSc: 0-No pain                 Lacole Komorowski K

## 2018-05-06 LAB — SURGICAL PATHOLOGY

## 2018-05-08 ENCOUNTER — Encounter: Payer: Self-pay | Admitting: Gastroenterology

## 2018-05-12 DIAGNOSIS — H2513 Age-related nuclear cataract, bilateral: Secondary | ICD-10-CM | POA: Diagnosis not present

## 2018-05-12 DIAGNOSIS — H2589 Other age-related cataract: Secondary | ICD-10-CM | POA: Diagnosis not present

## 2018-05-23 ENCOUNTER — Ambulatory Visit: Payer: Self-pay | Admitting: Obstetrics and Gynecology

## 2018-05-23 ENCOUNTER — Other Ambulatory Visit: Payer: Medicare Other

## 2018-05-25 DIAGNOSIS — Z23 Encounter for immunization: Secondary | ICD-10-CM | POA: Diagnosis not present

## 2018-06-02 ENCOUNTER — Ambulatory Visit: Payer: Medicare Other | Admitting: Gastroenterology

## 2018-06-03 ENCOUNTER — Ambulatory Visit: Payer: Medicare Other | Admitting: Gastroenterology

## 2018-06-03 ENCOUNTER — Other Ambulatory Visit: Payer: Self-pay

## 2018-06-03 ENCOUNTER — Ambulatory Visit (INDEPENDENT_AMBULATORY_CARE_PROVIDER_SITE_OTHER): Payer: Medicare Other | Admitting: Gastroenterology

## 2018-06-03 ENCOUNTER — Encounter: Payer: Self-pay | Admitting: Gastroenterology

## 2018-06-03 VITALS — BP 166/77 | HR 74 | Resp 16 | Ht 63.0 in | Wt 185.2 lb

## 2018-06-03 DIAGNOSIS — K580 Irritable bowel syndrome with diarrhea: Secondary | ICD-10-CM

## 2018-06-03 DIAGNOSIS — K582 Mixed irritable bowel syndrome: Secondary | ICD-10-CM

## 2018-06-03 MED ORDER — AMITRIPTYLINE HCL 25 MG PO TABS: 25.0000 mg | ORAL_TABLET | Freq: Every day | ORAL | 0 refills | Status: DC

## 2018-06-03 NOTE — Progress Notes (Signed)
[name], MD [address]  [place], [place] [address]  Main: [phone]  Fax: [phone]    Gastroenterology Consultation  Referring Provider:     Juline Patch, MD Primary Care Physician:  Juline Patch, MD Primary Gastroenterologist:  Dr. [name] Reason for Consultation:  IBS diarrhea        HPI:   Carrie Roberson is a 72 y.o. female referred by Dr. Juline Patch, MD  for consultation & management of alternating episodes of constipation and diarrhea, variable stool caliber from formed to loose/pasty, non bloody. No BM for 2-3 days followed by 2-3 times/day. Abdominal cramps during episodes of loose stools which get better after BM. She denies n/v/bloating/abdominal pain. Her weight has been stable. Symptoms ongoing past 2 years, was taking care of 38 y/o mother, she passed away recently. Moved with her sister to [facility] from [facility] about 3 months ago. Her symptoms gotten worse since then. She attributes most of her symptoms are secondary to diet, she reports that she hasn't been eating quite good, not having 3 regular meals per day. She drinks sodas up to 2 cans per day. She in fact had to go back to [place] for about a month as she has been having hard time adjusting in a new place with her sister. She doesn't sleep well either. She denies any recent use of antibiotics.  Ischemic colitis 2007, unsure of what segment, Had repeat colonoscopy a year later, reportedly normal Last colonoscopy in 2010-2011, a small polyp was removed and she was told she is not due until 10years  Follow-up visit 10/30/2017 Patient reports feeling significantly better with regards to her GI symptoms. Her workup revealed that she had Escherichia coli which was treated with Cipro for 5 days. I also started her on nortriptyline 10 mg at bedtime. She tried IB guard with no significant benefit. She currently reports that her symptoms of IBS have significantly improved. Currently  having bowel movements once every 1-2 days without diarrhea or constipation. She reports that her quality of life has significantly improved.  Follow up visit 03/03/18 She has been dealing with alternating episodes of diarrhea and constipation, influenced mostly by her diet. High carb, high lactose. She gained few pounds this summer. She wants to know if she can get a colonoscopy as she is worried if she is at high risk for colon cancer due to IBS.She is taking elavil 10mg  at bedtime  Follow-up visit 06/03/2018 Patient reports that her IBS symptoms are fairly under control.  She denies mixed episodes of diarrhea and constipation.  Currently, having bowel movements every 2 days with complete emptying.  Her days are normal in between these.  She is not taking nortriptyline and asking if she could switch to amitriptyline.  She has been gaining weight. NSAIDs: none history of Antiplts/Anticoagulants/Anti thrombotics: none  GI Procedures: In [place], reports not available Ischemic colitis 2007 based on a colonoscopy, unsure of what segment, Had repeat colonoscopy a year later, reportedly normal Last colonoscopy in 2010-2011, a small polyp was removed and she was told she is not due until 10years Colonoscopy 05/05/2018 - Four 3 to 5 mm polyps in the descending colon, in the ascending colon and in the cecum, removed with a cold snare. Resected and retrieved. - Normal mucosa in the entire examined colon. Biopsied. -Normal retroflexion in rectum DIAGNOSIS:  A. COLON POLYP, CECUM; COLD SNARE:  - TUBULAR ADENOMA, 2 FRAGMENTS.  - NEGATIVE  FOR HIGH-GRADE DYSPLASIA AND MALIGNANCY.   B. COLON POLYP, ASCENDING; COLD SNARE:  - TUBULAR ADENOMA.  - NEGATIVE FOR HIGH-GRADE DYSPLASIA AND MALIGNANCY.   C. COLON; RANDOM COLD BIOPSY:  - COLONIC MUCOSA WITH INTACT CRYPT ARCHITECTURE.  - NEGATIVE FOR MICROSCOPIC COLITIS AND DYSPLASIA.   D. COLON POLYP X 2, DESCENDING; COLD SNARE:  - TUBULAR ADENOMAS,  2 FRAGMENTS.  - NEGATIVE FOR HIGH-GRADE DYSPLASIA AND MALIGNANCY.   Past Medical History:  Diagnosis Date  . Hypertension   . IBS (irritable bowel syndrome)   . Ovarian cyst   . Vaginal atrophy     Past Surgical History:  Procedure Laterality Date  . BREAST BIOPSY Left    needle bx/clip-neg  . CERVICAL BIOPSY  W/ LOOP ELECTRODE EXCISION    . CHOLECYSTECTOMY    . Colonoscopoy  2007, 2008, 2011  . COLONOSCOPY WITH PROPOFOL N/A 05/05/2018   Procedure: COLONOSCOPY WITH PROPOFOL;  Surgeon: Lin Landsman, MD;  Location: [facility] ENDOSCOPY;  Service: Gastroenterology;  Laterality: N/A;  . LEEP    . TUBAL LIGATION  1999    Current Outpatient Medications:  .  acetaminophen (TYLENOL) 500 MG tablet, Take 1,000 mg by mouth every 8 (eight) hours as needed., Disp: , Rfl:  .  cetirizine (ZYRTEC) 10 MG tablet, Take 10 mg by mouth at bedtime., Disp: , Rfl:  .  nystatin cream (MYCOSTATIN), Apply 1 application topically 2 (two) times daily., Disp: 30 g, Rfl: 1 .  omeprazole (PRILOSEC) 40 MG capsule, Take 40 mg by mouth daily as needed., Disp: , Rfl:  .  amitriptyline (ELAVIL) 25 MG tablet, Take 1 tablet (25 mg total) by mouth at bedtime., Disp: 30 tablet, Rfl: 0 .  Prasterone (INTRAROSA) 6.5 MG INST, Place 6.5 mg vaginally at bedtime. (Patient not taking: Reported on 06/03/2018), Disp: 30 each, Rfl: 11    Family History  Problem Relation Age of Onset  . Heart failure Mother   . Atrial fibrillation Mother   . Hypertension Mother   . Diabetes Father   . Heart attack Father   . Breast cancer Maternal Aunt 70  . Breast cancer Maternal Grandmother 80  . Breast cancer Cousin        2 mat cousins  . Hypertension Sister   . Hypertension Sister   . Arthritis Sister      Social History   Tobacco Use  . Smoking status: Never Smoker  . Smokeless tobacco: Never Used  . Tobacco comment: smoking cessation materials not required  Substance Use Topics  . Alcohol use: Yes    Frequency: Never      Comment: rarely  . Drug use: No    Allergies as of 06/03/2018 - Review Complete 06/03/2018  Allergen Reaction Noted  . Citalopram Nausea And Vomiting 09/30/2017  . Ivp dye [iodinated diagnostic agents] Swelling 06/11/2017  . Meloxicam  06/11/2017  . Amlodipine Rash 06/11/2017  . Ampicillin Rash 06/11/2017  . Cefoxitin Rash 06/11/2017  . Estradiol Rash 06/11/2017  . Estropipate Rash 06/11/2017  . Olmesartan medoxomil-hctz Rash 06/11/2017  . Sertraline Rash 06/11/2017  . Sulfa antibiotics Rash 06/11/2017  . Sulfamethoxazole Rash 06/11/2017    Review of Systems:    All systems reviewed and negative except where noted in HPI.   Physical Exam:  BP (!) 166/77 (BP Location: Left Arm, Patient Position: Sitting, Cuff Size: Large)   Pulse 74   Resp 16   Ht 5\' 3"  (1.6 m)   Wt 185 lb 3.2 oz (84 kg)  BMI 32.81 kg/m  No LMP recorded. Patient is postmenopausal.  General:   Alert,  Well-developed, well-nourished, pleasant and cooperative in NAD Head:  Normocephalic and atraumatic. Eyes:  Sclera clear, no icterus.   Conjunctiva pink. Ears:  Normal auditory acuity. Nose:  No deformity, discharge, or lesions. Mouth:  No deformity or lesions,oropharynx pink & moist. Neck:  Supple; no masses or thyromegaly. Lungs:  Respirations even and unlabored.  Clear throughout to auscultation.   No wheezes, crackles, or rhonchi. No acute distress. Heart:  Regular rate and rhythm; no murmurs, clicks, rubs, or gallops. Abdomen:  Normal bowel sounds. Soft, non-tender and non-distended without masses, hepatosplenomegaly or hernias noted.  No guarding or rebound tenderness.   Rectal: Not performed Msk:  Symmetrical without gross deformities. Good, equal movement & strength bilaterally. Pulses:  Normal pulses noted. Extremities:  No clubbing or edema.  No cyanosis. Neurologic:  Alert and oriented x3;  grossly normal neurologically. Skin:  Intact without significant lesions or rashes. No  jaundice. Psych:  Alert and cooperative. Normal mood and affect.  Imaging Studies: No abdominal imaging done  Assessment and Plan:   Carrie Roberson is a 72 y.o. Caucasian female with no significant past medical history, presents with 2 years history of altered bowel habits, predominantly increased bowel frequency with abdominal cramps, and 3 month history of worsening of the symptoms. Patient does not have alarm signs or symptoms. Her CBC, CMP and TSH are normal. Her history is highly consistent with irritable bowel syndrome, diarrhea predominant in the setting of stress/adjustment to new place. History of ischemic colitis is a risk factor for irritable bowel syndrome. She had 3 colonoscopies in last 15 years which is reassuring and it is less likely that we are dealing with malignancy here. Workup revealed Escherichia coli infection and underwent treatment with ciprofloxacin. Fecal lactoferrin negative TTG IgA normal, CRP normal.  There was no evidence of microscopic colitis IBS symptoms are mixed type at present, mostly influenced by her diet and symptoms are manageable with diet adjustment  IBS, mixed type -Switch to amitriptyline 25 mg at bedtime, increase to 50 mg if able to tolerate -Continue to avoid carbonated beverages and artificial sweeteners  Colon cancer surveillance: - Reassured her that IBS does not increase the risk for colon cancer or polyps -Colonoscopy 04/2013 revealed multiple tubular adenomas, less than 1 cm.  Recommend surveillance colonoscopy in 04/2021   Follow up in 4-6 months  [name], MD

## 2018-06-06 ENCOUNTER — Encounter: Payer: Self-pay | Admitting: Obstetrics and Gynecology

## 2018-06-06 ENCOUNTER — Ambulatory Visit (INDEPENDENT_AMBULATORY_CARE_PROVIDER_SITE_OTHER): Payer: Medicare Other | Admitting: Obstetrics and Gynecology

## 2018-06-06 ENCOUNTER — Ambulatory Visit (INDEPENDENT_AMBULATORY_CARE_PROVIDER_SITE_OTHER): Payer: Medicare Other

## 2018-06-06 VITALS — BP 150/90 | HR 90 | Wt 186.0 lb

## 2018-06-06 DIAGNOSIS — N83202 Unspecified ovarian cyst, left side: Secondary | ICD-10-CM | POA: Diagnosis not present

## 2018-06-06 DIAGNOSIS — N895 Stricture and atresia of vagina: Secondary | ICD-10-CM

## 2018-06-06 DIAGNOSIS — N952 Postmenopausal atrophic vaginitis: Secondary | ICD-10-CM | POA: Diagnosis not present

## 2018-06-06 NOTE — Progress Notes (Signed)
Gynecology Ultrasound Follow Up  Chief Complaint:  Chief Complaint  Patient presents with  . Follow-up    GYN Ultrasound     History of Present Illness: Patient is a 72 y.o. female who presents today for ultrasound evaluation of left adnexal cyst.  Ultrasound demonstrates the following findgins Adnexa: Stable simple left adnexal cyst Uterus: Non-enlarged, normal endometrial strip Additional: No free fluid  Has had some minimal improvement in vaginal symptoms attributed to vulvovaginal atrophy following use of Intarosa.  She is not sexually active.  States majority of her symptoms are actually external.  Review of Systems: Review of Systems  Constitutional: Negative.   Gastrointestinal: Negative.   Genitourinary: Positive for dysuria. Negative for frequency, hematuria and urgency.  Neurological: Positive for weakness.    Past Medical History:  Past Medical History:  Diagnosis Date  . History of ischemic colitis 09/02/2017  . Hx of colonic polyp   . Hypertension   . IBS (irritable bowel syndrome)   . Ovarian cyst   . Vaginal atrophy     Past Surgical History:  Past Surgical History:  Procedure Laterality Date  . BREAST BIOPSY Left    needle bx/clip-neg  . CERVICAL BIOPSY  W/ LOOP ELECTRODE EXCISION    . CHOLECYSTECTOMY    . Colonoscopoy  2007, 2008, 2011  . COLONOSCOPY WITH PROPOFOL N/A 05/05/2018   Procedure: COLONOSCOPY WITH PROPOFOL;  Surgeon: Lin Landsman, MD;  Location: [facility] ENDOSCOPY;  Service: Gastroenterology;  Laterality: N/A;  . LEEP    . TUBAL LIGATION  1999    Gynecologic History:  No LMP recorded. Patient is postmenopausal.  Family History:  Family History  Problem Relation Age of Onset  . Heart failure Mother   . Atrial fibrillation Mother   . Hypertension Mother   . Diabetes Father   . Heart attack Father   . Breast cancer Maternal Aunt 70  . Breast cancer Maternal Grandmother 80  . Breast cancer Cousin        2 mat cousins  .  Hypertension Sister   . Hypertension Sister   . Arthritis Sister     Social History:  Social History   Socioeconomic History  . Marital status: Single    Spouse name: Not on file  . Number of children: 0  . Years of education: Not on file  . Highest education level: Associate degree: academic program  Occupational History  . Occupation: Retired  Scientific laboratory technician  . Financial resource strain: Not hard at all  . Food insecurity:    Worry: Never true    Inability: Never true  . Transportation needs:    Medical: No    Non-medical: No  Tobacco Use  . Smoking status: Never Smoker  . Smokeless tobacco: Never Used  . Tobacco comment: smoking cessation materials not required  Substance and Sexual Activity  . Alcohol use: Yes    Frequency: Never    Comment: rarely  . Drug use: No  . Sexual activity: Never    Birth control/protection: Post-menopausal  Lifestyle  . Physical activity:    Days per week: 0 days    Minutes per session: 0 min  . Stress: Not at all  Relationships  . Social connections:    Talks on phone: Patient refused    Gets together: Patient refused    Attends religious service: Patient refused    Active member of club or organization: Patient refused    Attends meetings of clubs or organizations: Patient refused  Relationship status: Patient refused  . Intimate partner violence:    Fear of current or ex partner: No    Emotionally abused: No    Physically abused: No    Forced sexual activity: No  Other Topics Concern  . Not on file  Social History Narrative  . Not on file    Allergies:  Allergies  Allergen Reactions  . Citalopram Nausea And Vomiting  . Ivp Dye [Iodinated Diagnostic Agents] Swelling    hives  . Meloxicam     unknown  . Amlodipine Rash  . Ampicillin Rash  . Cefoxitin Rash  . Estradiol Rash  . Estropipate Rash  . Olmesartan Medoxomil-Hctz Rash  . Sertraline Rash       . Sulfa Antibiotics Rash  . Sulfamethoxazole Rash     Medications: Prior to Admission medications   Medication Sig Start Date End Date Taking? Authorizing Provider  acetaminophen (TYLENOL) 500 MG tablet Take 1,000 mg by mouth every 8 (eight) hours as needed.   Yes [provider]  cetirizine (ZYRTEC) 10 MG tablet Take 10 mg by mouth at bedtime.   Yes [provider]  nystatin cream (MYCOSTATIN) Apply 1 application topically 2 (two) times daily. 09/23/17  Yes [name], MD  omeprazole (PRILOSEC) 40 MG capsule Take 40 mg by mouth daily as needed.   Yes [provider]  Prasterone (INTRAROSA) 6.5 MG INST Place 6.5 mg vaginally at bedtime. Patient not taking: Reported on 06/03/2018 01/14/18   [name], MD    Physical Exam Vitals: Blood pressure (!) 150/90, pulse 90, weight 186 lb (84.4 kg).  General: NAD HEENT: normocephalic, anicteric Pulmonary: No increased work of breathing Extremities: no edema, erythema, or tenderness Neurologic: Grossly intact, normal gait Psychiatric: mood appropriate, affect full   Assessment: 72 y.o. G0P0000 No problem-specific Assessment & Plan notes found for this encounter.   Plan: Problem List Items Addressed This Visit    None      1) Left ovarian cyst - stability demonstrated on imaging today with no concerning changes in overall appearance.  Significant discomfort with TVUS consider abdominal ultrasound should future imaging be warranted.   1. Cysts ?1 cm: Are clinically inconsequential; at the discretion of the interpreting physician whether or not to describe them in the imaging report; do not need follow-up. 2. Cysts >1 and ?7 cm: Should be described in the imaging report with statement that they are almost certainly benign; yearly follow-up, at least initially, with [place] recommended. Some practices may opt to increase the lower size threshold for follow-up from 1 cm to as high as 3 cm. One may opt to continue follow-up annually or to decrease the frequency of  follow-up once stability or decrease in size has been confirmed. Cysts in the larger end of this range should still generally be followed on a regular basis. 3. Cysts >7 cm: Since these may be difficult to assess completely with [place], further imaging with MR or surgical evaluation should be considered.  Gordy Levan et al. Management of Asymptomatic Ovarian and Other Adnexal Cysts Imaged at [place]: Society of Radiologists in [place] Statement 2010. Radiology 256 (Sept 2010): 673-419.).  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  4) Return in about 8 years (around 06/06/2026) for medication follow up.   [name],  MD, [facility], [place]

## 2018-06-08 ENCOUNTER — Ambulatory Visit
Admission: EM | Admit: 2018-06-08 | Discharge: 2018-06-08 | Disposition: A | Discharge status: Home / Self Care | Payer: Medicare Other | Attending: Family Medicine | Admitting: Family Medicine

## 2018-06-08 ENCOUNTER — Other Ambulatory Visit: Payer: Self-pay

## 2018-06-08 DIAGNOSIS — N3001 Acute cystitis with hematuria: Secondary | ICD-10-CM

## 2018-06-08 DIAGNOSIS — Z888 Allergy status to other drugs, medicaments and biological substances status: Secondary | ICD-10-CM | POA: Insufficient documentation

## 2018-06-08 DIAGNOSIS — Z79899 Other long term (current) drug therapy: Secondary | ICD-10-CM | POA: Diagnosis not present

## 2018-06-08 DIAGNOSIS — I10 Essential (primary) hypertension: Secondary | ICD-10-CM | POA: Diagnosis not present

## 2018-06-08 DIAGNOSIS — R319 Hematuria, unspecified: Secondary | ICD-10-CM | POA: Diagnosis present

## 2018-06-08 DIAGNOSIS — F419 Anxiety disorder, unspecified: Secondary | ICD-10-CM | POA: Diagnosis not present

## 2018-06-08 DIAGNOSIS — Z880 Allergy status to penicillin: Secondary | ICD-10-CM | POA: Insufficient documentation

## 2018-06-08 DIAGNOSIS — Z882 Allergy status to sulfonamides status: Secondary | ICD-10-CM | POA: Insufficient documentation

## 2018-06-08 DIAGNOSIS — R300 Dysuria: Secondary | ICD-10-CM | POA: Diagnosis present

## 2018-06-08 LAB — URINALYSIS, COMPLETE (UACMP) WITH MICROSCOPIC
Bilirubin Urine: NEGATIVE
Glucose, UA: NEGATIVE mg/dL
Nitrite: POSITIVE — AB
PROTEIN: 30 mg/dL — AB
RBC / HPF: >50 RBC/hpf (ref 0–5)
Specific Gravity, Urine: 1.02 (ref 1.005–1.030)
pH: 5.5 (ref 5.0–8.0)

## 2018-06-08 MED ORDER — NITROFURANTOIN MONOHYD MACRO 100 MG PO CAPS: 100.0000 mg | ORAL_CAPSULE | Freq: Two times a day (BID) | ORAL | 0 refills | Status: AC

## 2018-06-08 NOTE — ED Provider Notes (Signed)
[facility]    CSN: [id number] Arrival date & time: 06/08/18  1336  History   Chief Complaint Chief Complaint  Patient presents with  . Hematuria  . Dysuria   HPI  72 year old female presents with concerns for UTI.  Patient reports that she has had symptoms for the past few days.  Patient states that it started after she had a transvaginal ultrasound on Friday.  She reports blood in her urine.  She is unsure whether this is coming from her vagina or with her to strictly hematuria.  Urinary urgency.  Has history of UTIs.  Burns with urination.  No abdominal pain.  No fever.  No chills.  Patient has a known history of atrophic vaginitis which makes her prone to UTIs.  No medications or interventions tried.   PMH, Surgical Hx, Family Hx, Social History reviewed and updated as below. Past Medical History:  Diagnosis Date  . History of ischemic colitis 09/02/2017  . Hx of colonic polyp   . Hypertension   . IBS (irritable bowel syndrome)   . Ovarian cyst   . Vaginal atrophy     Patient Active Problem List   Diagnosis Date Noted  . Anxiety 09/02/2017  . 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 FH: diabetes mellitus 09/02/2017  . 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 Atypical nevus 09/02/2017  . IBS (irritable bowel syndrome) 09/02/2017  . Atrophic vaginitis 09/02/2017  . Chronic idiopathic urticaria 09/02/2017  . GERD (gastroesophageal reflux disease) 09/02/2017    Past Surgical History:  Procedure Laterality Date  . BREAST BIOPSY Left    needle bx/clip-neg  . CERVICAL BIOPSY  W/ LOOP ELECTRODE EXCISION    . CHOLECYSTECTOMY    . Colonoscopoy  2007, 2008, 2011  . COLONOSCOPY WITH PROPOFOL N/A 05/05/2018   Procedure: COLONOSCOPY WITH PROPOFOL;  Surgeon: Lin Landsman, MD;  Location: [facility] ENDOSCOPY;  Service: Gastroenterology;  Laterality: N/A;  . LEEP    . TUBAL LIGATION  1999    OB History    Gravida  0   Para  0   Term  0   Preterm  0   AB  0   Living  0     SAB  0   TAB  0   Ectopic  0   Multiple  0   Live Births  0            Home Medications    Prior to Admission medications   Medication Sig Start Date End Date Taking? Authorizing Provider  acetaminophen (TYLENOL) 500 MG tablet Take 1,000 mg by mouth every 8 (eight) hours as needed.    [provider]  cetirizine (ZYRTEC) 10 MG tablet Take 10 mg by mouth at bedtime.    [provider]  nitrofurantoin, macrocrystal-monohydrate, (MACROBID) 100 MG capsule Take 1 capsule (100 mg total) by mouth 2 (two) times daily for 7 days. 06/08/18 06/15/18  Coral Spikes, DO  nystatin cream (MYCOSTATIN) Apply 1 application topically 2 (two) times daily. 09/23/17   [name], MD  omeprazole (PRILOSEC) 40 MG capsule Take 40 mg by mouth daily as needed.    [provider]    Family History Family History  Problem Relation Age of Onset  . Heart failure Mother   . Atrial fibrillation Mother   . Hypertension Mother   . Diabetes Father   . Heart attack Father   . Breast cancer Maternal Aunt 70  .  Breast cancer Maternal Grandmother 80  . Breast cancer Cousin        2 mat cousins  . Hypertension Sister   . Hypertension Sister   . Arthritis Sister     Social History Social History   Tobacco Use  . Smoking status: Never Smoker  . Smokeless tobacco: Never Used  . Tobacco comment: smoking cessation materials not required  Substance Use Topics  . Alcohol use: Yes    Frequency: Never    Comment: rarely  . Drug use: No     Allergies   Citalopram; Ivp dye [iodinated diagnostic agents]; Meloxicam; Amlodipine; Ampicillin; Cefoxitin; Estradiol; Estropipate; Olmesartan medoxomil-hctz; Sertraline; Sulfa antibiotics; and Sulfamethoxazole   Review of Systems Review of Systems  Constitutional: Negative for fever.  Genitourinary: Positive for hematuria and urgency.   Physical Exam Triage Vital  Signs ED Triage Vitals  Enc Vitals Group     BP 06/08/18 1352 (!) 190/88     Pulse Rate 06/08/18 1352 89     Resp 06/08/18 1352 16     Temp 06/08/18 1352 98.3 F (36.8 C)     Temp Source 06/08/18 1352 Oral     SpO2 06/08/18 1352 100 %     Weight 06/08/18 1355 182 lb (82.6 kg)     Height 06/08/18 1355 5\' 3"  (1.6 m)     Head Circumference --      Peak Flow --      Pain Score 06/08/18 1355 4     Pain Loc --      Pain Edu? --      Excl. in [place]? --    No data found.  Updated Vital Signs BP (!) 190/88 (BP Location: Right Arm)   Pulse 89   Temp 98.3 F (36.8 C) (Oral)   Resp 16   Ht 5\' 3"  (1.6 m)   Wt 82.6 kg   SpO2 100%   BMI 32.24 kg/m   Visual Acuity Right Eye Distance:   Left Eye Distance:   Bilateral Distance:    Right Eye Near:   Left Eye Near:    Bilateral Near:     Physical Exam  Constitutional: She appears well-developed. No distress.  HENT:  Head: Normocephalic and atraumatic.  Cardiovascular: Normal rate and regular rhythm.  Pulmonary/Chest: Effort normal and breath sounds normal. She has no wheezes. She has no rales.  Abdominal: Soft. She exhibits no distension. There is no tenderness.  Neurological: She is alert.  Psychiatric: She has a normal mood and affect. Her behavior is normal.  Nursing note and vitals reviewed.  UC Treatments / Results  Labs (all labs ordered are listed, but only abnormal results are displayed) Labs Reviewed  URINALYSIS, COMPLETE (UACMP) WITH MICROSCOPIC - Abnormal; Notable for the following components:      Result Value   APPearance CLOUDY (*)    Hgb urine dipstick LARGE (*)    Ketones, ur TRACE (*)    Protein, ur 30 (*)    Nitrite POSITIVE (*)    Leukocytes, UA LARGE (*)    Bacteria, UA MANY (*)    All other components within normal limits  URINE CULTURE    EKG None  Radiology No results found.  Procedures Procedures (including critical care time)  Medications Ordered in UC Medications - No data to  display  Initial Impression / Assessment and Plan / UC Course  I have reviewed the triage vital signs and the nursing notes.  Pertinent labs & imaging results that were  available during my care of the patient were reviewed by me and considered in my medical decision making (see chart for details).    72 year old female presents with UTI.  Send culture.  Patient is allergic to penicillins, cephalosporins, Bactrim.  I have chosen to put her on Macrobid as I feel that this is a better option then placing her on ciprofloxacin.   Final Clinical Impressions(s) / UC Diagnoses   Final diagnoses:  Acute cystitis with hematuria   Discharge Instructions   None    ED Prescriptions    Medication Sig Dispense Auth. Provider   nitrofurantoin, macrocrystal-monohydrate, (MACROBID) 100 MG capsule Take 1 capsule (100 mg total) by mouth 2 (two) times daily for 7 days. 14 capsule Coral Spikes, DO     Controlled Substance Prescriptions [place] Controlled Substance Registry consulted? Not Applicable   Coral Spikes, DO 06/08/18 1655

## 2018-06-08 NOTE — ED Triage Notes (Signed)
Pt had transvaginal ultrasound on Friday. Today she had bleeding when she urinated but unsure if it's coming from urine or vagina. Has urinary urgency. Burning with urination

## 2018-06-11 ENCOUNTER — Telehealth (HOSPITAL_COMMUNITY): Payer: Self-pay

## 2018-06-11 LAB — URINE CULTURE

## 2018-06-11 NOTE — Telephone Encounter (Signed)
urine culture positive for e.coli. This was treated with Macrobid at ucc visit. Pt called and made aware.

## 2018-06-13 ENCOUNTER — Other Ambulatory Visit: Payer: Self-pay | Admitting: Advanced Practice Midwife

## 2018-06-13 DIAGNOSIS — B379 Candidiasis, unspecified: Secondary | ICD-10-CM

## 2018-06-13 MED ORDER — FLUCONAZOLE 150 MG PO TABS: 150.0000 mg | ORAL_TABLET | Freq: Once | ORAL | 1 refills | Status: AC

## 2018-06-13 NOTE — Progress Notes (Signed)
Rx diflucan sent to patient pharmacy

## 2018-06-30 ENCOUNTER — Other Ambulatory Visit: Payer: Self-pay

## 2018-06-30 ENCOUNTER — Encounter: Payer: Self-pay | Admitting: Emergency Medicine

## 2018-06-30 ENCOUNTER — Ambulatory Visit
Admission: EM | Admit: 2018-06-30 | Discharge: 2018-06-30 | Disposition: A | Discharge status: Home / Self Care | Payer: Medicare Other | Attending: Family Medicine | Admitting: Family Medicine

## 2018-06-30 DIAGNOSIS — B9789 Other viral agents as the cause of diseases classified elsewhere: Secondary | ICD-10-CM | POA: Diagnosis not present

## 2018-06-30 DIAGNOSIS — J069 Acute upper respiratory infection, unspecified: Secondary | ICD-10-CM

## 2018-06-30 MED ORDER — FLUCONAZOLE 150 MG PO TABS: ORAL_TABLET | ORAL | 0 refills | Status: DC

## 2018-06-30 MED ORDER — FLUTICASONE PROPIONATE 50 MCG/ACT NA SUSP: 2.0000 | Freq: Every day | NASAL | 0 refills | Status: DC

## 2018-06-30 MED ORDER — BENZONATATE 200 MG PO CAPS: ORAL_CAPSULE | ORAL | 0 refills | Status: DC

## 2018-06-30 MED ORDER — AZITHROMYCIN 250 MG PO TABS: 250.0000 mg | ORAL_TABLET | Freq: Every day | ORAL | 0 refills | Status: DC

## 2018-06-30 MED ORDER — HYDROCOD POLST-CPM POLST ER 10-8 MG/5ML PO SUER: 5.0000 mL | Freq: Two times a day (BID) | ORAL | 0 refills | Status: DC

## 2018-06-30 NOTE — Discharge Instructions (Addendum)
Plenty of fluids and rest.

## 2018-06-30 NOTE — ED Provider Notes (Signed)
[facility]    CSN: [id number] Arrival date & time: 06/30/18  1029     History   Chief Complaint Chief Complaint  Patient presents with  . Cough    HPI Carrie Roberson is a 72 y.o. female.   HPI  72 year old female presents with cough nasal congestion postnasal drip and drainage.  States that it started about 2 weeks ago.  Cough is especially worse at night and keeping her awake.  She is able to sleep only 2 hours a night.  Her sister with whom she lives with also has had this same symptoms for over 6 weeks.  Patient does not complain of fever or chills.  O2 sats are 96% on room air she is afebrile today.         Past Medical History:  Diagnosis Date  . History of ischemic colitis 09/02/2017  . Hx of colonic polyp   . Hypertension   . IBS (irritable bowel syndrome)   . Ovarian cyst   . Vaginal atrophy     Patient Active Problem List   Diagnosis Date Noted  . Anxiety 09/02/2017  . 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 FH: diabetes mellitus 09/02/2017  . 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 Atypical nevus 09/02/2017  . IBS (irritable bowel syndrome) 09/02/2017  . Atrophic vaginitis 09/02/2017  . Chronic idiopathic urticaria 09/02/2017  . GERD (gastroesophageal reflux disease) 09/02/2017    Past Surgical History:  Procedure Laterality Date  . BREAST BIOPSY Left    needle bx/clip-neg  . CERVICAL BIOPSY  W/ LOOP ELECTRODE EXCISION    . CHOLECYSTECTOMY    . Colonoscopoy  2007, 2008, 2011  . COLONOSCOPY WITH PROPOFOL N/A 05/05/2018   Procedure: COLONOSCOPY WITH PROPOFOL;  Surgeon: Lin Landsman, MD;  Location: [facility] ENDOSCOPY;  Service: Gastroenterology;  Laterality: N/A;  . LEEP    . TUBAL LIGATION  1999    OB History    Gravida  0   Para  0   Term  0   Preterm  0   AB  0   Living  0     SAB  0   TAB  0   Ectopic  0   Multiple  0   Live Births  0            Home Medications     Prior to Admission medications   Medication Sig Start Date End Date Taking? Authorizing Provider  acetaminophen (TYLENOL) 500 MG tablet Take 1,000 mg by mouth every 8 (eight) hours as needed.   Yes [provider]  amitriptyline (ELAVIL) 25 MG tablet Take 25 mg by mouth at bedtime.   Yes [provider]  cetirizine (ZYRTEC) 10 MG tablet Take 10 mg by mouth at bedtime.   Yes [provider]  omeprazole (PRILOSEC) 40 MG capsule Take 40 mg by mouth daily as needed.   Yes [provider]  azithromycin (ZITHROMAX) 250 MG tablet Take 1 tablet (250 mg total) by mouth daily. Take first 2 tablets together, then 1 every day until finished. 06/30/18   Lorin Picket, PA-C  benzonatate (TESSALON) 200 MG capsule Take one cap TID PRN cough 06/30/18   Lorin Picket, PA-C  chlorpheniramine-HYDROcodone [facility]) 10-8 MG/5ML SUER Take 5 mLs by mouth 2 (two) times daily. 06/30/18   Lorin Picket, PA-C  fluconazole (DIFLUCAN) 150 MG tablet Take one tab for symptoms of yeast infection. Repeat  x 1 in 72 hours. 06/30/18   Lorin Picket, PA-C  fluticasone (FLONASE) 50 MCG/ACT nasal spray Place 2 sprays into both nostrils daily. 06/30/18   Lorin Picket, PA-C    Family History Family History  Problem Relation Age of Onset  . Heart failure Mother   . Atrial fibrillation Mother   . Hypertension Mother   . Diabetes Father   . Heart attack Father   . Breast cancer Maternal Aunt 70  . Breast cancer Maternal Grandmother 80  . Breast cancer Cousin        2 mat cousins  . Hypertension Sister   . Hypertension Sister   . Arthritis Sister     Social History Social History   Tobacco Use  . Smoking status: Never Smoker  . Smokeless tobacco: Never Used  . Tobacco comment: smoking cessation materials not required  Substance Use Topics  . Alcohol use: Yes    Frequency: Never    Comment: rarely  . Drug use: No     Allergies   Citalopram; Ivp  dye [iodinated diagnostic agents]; Meloxicam; Amlodipine; Ampicillin; Cefoxitin; Estradiol; Estropipate; Olmesartan medoxomil-hctz; Sertraline; Sulfa antibiotics; and Sulfamethoxazole   Review of Systems Review of Systems  Constitutional: Positive for activity change. Negative for appetite change, chills, fatigue and fever.  HENT: Positive for congestion, postnasal drip, sinus pressure and sinus pain.   Respiratory: Positive for cough.   All other systems reviewed and are negative.    Physical Exam Triage Vital Signs ED Triage Vitals  Enc Vitals Group     BP 06/30/18 1050 (!) 161/84     Pulse Rate 06/30/18 1050 70     Resp 06/30/18 1050 18     Temp 06/30/18 1050 98.2 F (36.8 C)     Temp Source 06/30/18 1050 Oral     SpO2 06/30/18 1050 96 %     Weight 06/30/18 1046 182 lb (82.6 kg)     Height 06/30/18 1046 5\' 3"  (1.6 m)     Head Circumference --      Peak Flow --      Pain Score 06/30/18 1046 0     Pain Loc --      Pain Edu? --      Excl. in [place]? --    No data found.  Updated Vital Signs BP (!) 161/84 (BP Location: Left Arm)   Pulse 70   Temp 98.2 F (36.8 C) (Oral)   Resp 18   Ht 5\' 3"  (1.6 m)   Wt 182 lb (82.6 kg)   SpO2 96%   BMI 32.24 kg/m   Visual Acuity Right Eye Distance:   Left Eye Distance:   Bilateral Distance:    Right Eye Near:   Left Eye Near:    Bilateral Near:     Physical Exam  Constitutional: She is oriented to person, place, and time. She appears well-developed and well-nourished. No distress.  HENT:  Head: Normocephalic.  Right Ear: External ear normal.  Left Ear: External ear normal.  Nose: Nose normal.  Mouth/Throat: Oropharynx is clear and moist. No oropharyngeal exudate.  Eyes: Pupils are equal, round, and reactive to light. Right eye exhibits no discharge. Left eye exhibits no discharge.  Neck: Normal range of motion.  Pulmonary/Chest: Effort normal and breath sounds normal.  Musculoskeletal: Normal range of motion.    Lymphadenopathy:    She has no cervical adenopathy.  Neurological: She is alert and oriented to person, place, and time.  Skin: Skin  is warm and dry. She is not diaphoretic.  Psychiatric: She has a normal mood and affect. Her behavior is normal. Judgment and thought content normal.  Nursing note and vitals reviewed.    UC Treatments / Results  Labs (all labs ordered are listed, but only abnormal results are displayed) Labs Reviewed - No data to display  EKG None  Radiology No results found.  Procedures Procedures (including critical care time)  Medications Ordered in UC Medications - No data to display  Initial Impression / Assessment and Plan / UC Course  I have reviewed the triage vital signs and the nursing notes.  Pertinent labs & imaging results that were available during my care of the patient were reviewed by me and considered in my medical decision making (see chart for details).   She has had 2 weeks of upper respiratory symptoms which have not been responding sufficiently at home with over-the-counter medications. Place Her on azithromycin.  We will give her cough suppressants with appropriate precautions.  She has asked for Diflucan since starting antibiotics always seems to cause her vaginal yeast infection.  If she has high fevers persistent coughing that is not improving or worsening she should go to the emergency room, return to our clinic or see her primary care physician   Final Clinical Impressions(s) / UC Diagnoses   Final diagnoses:  Upper respiratory tract infection, unspecified type     Discharge Instructions     Plenty of fluids and rest.    ED Prescriptions    Medication Sig Dispense Auth. Provider   azithromycin (ZITHROMAX) 250 MG tablet Take 1 tablet (250 mg total) by mouth daily. Take first 2 tablets together, then 1 every day until finished. 6 tablet Lorin Picket, PA-C   benzonatate (TESSALON) 200 MG capsule Take one cap TID PRN  cough 30 capsule Lorin Picket, PA-C   chlorpheniramine-HYDROcodone (TUSSIONEX PENNKINETIC ER) 10-8 MG/5ML SUER Take 5 mLs by mouth 2 (two) times daily. 115 mL Crecencio Mc P, PA-C   fluticasone (FLONASE) 50 MCG/ACT nasal spray Place 2 sprays into both nostrils daily. 16 g Crecencio Mc P, PA-C   fluconazole (DIFLUCAN) 150 MG tablet Take one tab for symptoms of yeast infection. Repeat x 1 in 72 hours. 2 tablet Lorin Picket, PA-C     Controlled Substance Prescriptions [place] Controlled Substance Registry consulted? Not Applicable   Lorin Picket, PA-C 06/30/18 1201

## 2018-06-30 NOTE — ED Triage Notes (Signed)
Pt c/o cough, nasal congestion, post nasal and drainage. Started about 2 weeks ago. Cough is keeping her up at night.

## 2018-07-05 ENCOUNTER — Other Ambulatory Visit: Payer: Self-pay | Admitting: Gastroenterology

## 2018-08-04 ENCOUNTER — Ambulatory Visit: Payer: Medicare Other | Admitting: Obstetrics and Gynecology

## 2018-08-07 ENCOUNTER — Encounter: Payer: Self-pay | Admitting: Gastroenterology

## 2018-08-07 ENCOUNTER — Other Ambulatory Visit: Payer: Self-pay | Admitting: Gastroenterology

## 2018-08-07 DIAGNOSIS — K580 Irritable bowel syndrome with diarrhea: Secondary | ICD-10-CM

## 2018-08-07 MED ORDER — AMITRIPTYLINE HCL 25 MG PO TABS: 50.0000 mg | ORAL_TABLET | Freq: Every day | ORAL | 0 refills | Status: DC

## 2018-10-02 DIAGNOSIS — Z23 Encounter for immunization: Secondary | ICD-10-CM | POA: Diagnosis not present

## 2018-12-06 ENCOUNTER — Other Ambulatory Visit: Payer: Self-pay | Admitting: Gastroenterology

## 2018-12-06 DIAGNOSIS — K580 Irritable bowel syndrome with diarrhea: Secondary | ICD-10-CM

## 2019-01-07 ENCOUNTER — Ambulatory Visit: Payer: Medicare Other

## 2019-02-20 ENCOUNTER — Other Ambulatory Visit: Payer: Self-pay

## 2019-03-21 ENCOUNTER — Telehealth: Payer: Medicare Other | Admitting: Family

## 2019-03-21 DIAGNOSIS — R300 Dysuria: Secondary | ICD-10-CM

## 2019-03-21 MED ORDER — NITROFURANTOIN MONOHYD MACRO 100 MG PO CAPS: 100.0000 mg | ORAL_CAPSULE | Freq: Two times a day (BID) | ORAL | 0 refills | Status: DC

## 2019-03-21 MED ORDER — FLUCONAZOLE 150 MG PO TABS: 150.0000 mg | ORAL_TABLET | Freq: Once | ORAL | 0 refills | Status: AC

## 2019-03-21 NOTE — Progress Notes (Signed)
We are sorry that you are not feeling well.  Here is how we plan to help!  Based on what you shared with me it looks like you most likely have a simple urinary tract infection.  A UTI (Urinary Tract Infection) is a bacterial infection of the bladder.  Most cases of urinary tract infections are simple to treat but a key part of your care is to encourage you to drink plenty of fluids and watch your symptoms carefully.  I have prescribed MacroBid 100 mg twice a day for 5 days.  Your symptoms should gradually improve. Call us if the burning in your urine worsens, you develop worsening fever, back pain or pelvic pain or if your symptoms do not resolve after completing the antibiotic. Diflucan sent as well.  Urinary tract infections can be prevented by drinking plenty of water to keep your body hydrated.  Also be sure when you wipe, wipe from front to back and don't hold it in!  If possible, empty your bladder every 4 hours.  Your e-visit answers were reviewed by a board certified advanced clinical practitioner to complete your personal care plan.  Depending on the condition, your plan could have included both over the counter or prescription medications.  If there is a problem please reply  once you have received a response from your provider.  Your safety is important to [place].  If you have drug allergies check your prescription carefully.    You can use MyChart to ask questions about today's visit, request a non-urgent call back, or ask for a work or school excuse for 24 hours related to this e-Visit. If it has been greater than 24 hours you will need to follow up with your provider, or enter a new e-Visit to address those concerns.   You will get an e-mail in the next two days asking about your experience.  I hope that your e-visit has been valuable and will speed your recovery. Thank you for using e-visits.   Greater than 5 minutes, yet less than 10 minutes of time have been spent researching,  coordinating, and implementing care for this patient today.  Thank you for the details you included in the comment boxes. Those details are very helpful in determining the best course of treatment for you and help [place] to provide the best care.

## 2019-03-23 ENCOUNTER — Ambulatory Visit (INDEPENDENT_AMBULATORY_CARE_PROVIDER_SITE_OTHER): Payer: Medicare Other | Admitting: Family Medicine

## 2019-03-23 ENCOUNTER — Other Ambulatory Visit: Payer: Self-pay

## 2019-03-23 ENCOUNTER — Encounter: Payer: Self-pay | Admitting: Family Medicine

## 2019-03-23 VITALS — BP 120/60 | HR 80 | Ht 63.0 in | Wt 191.0 lb

## 2019-03-23 DIAGNOSIS — K589 Irritable bowel syndrome without diarrhea: Secondary | ICD-10-CM

## 2019-03-23 DIAGNOSIS — N3000 Acute cystitis without hematuria: Secondary | ICD-10-CM | POA: Diagnosis not present

## 2019-03-23 MED ORDER — AMITRIPTYLINE HCL 25 MG PO TABS: 25.0000 mg | ORAL_TABLET | Freq: Every day | ORAL | 1 refills | Status: DC

## 2019-03-23 MED ORDER — NITROFURANTOIN MACROCRYSTAL 50 MG PO CAPS: 50.0000 mg | ORAL_CAPSULE | Freq: Four times a day (QID) | ORAL | 0 refills | Status: DC

## 2019-03-23 NOTE — Progress Notes (Signed)
Date:  03/23/2019   Name:  Carrie Roberson   DOB:  [birth year]   MRN:  [id number]   Chief Complaint: Urinary Tract Infection (started with frequency, burning and pressure- started macrobid and vomitted. Going to give her cephalexin- but allergic to penicillin. ) and Depression (PHQ9=9 and GAD7=4)  Urinary Tract Infection  This is a recurrent problem. The current episode started in the past 7 days. The problem occurs every urination. The problem has been gradually improving. The quality of the pain is described as burning. The pain is mild. There has been no fever. Associated symptoms include frequency and urgency. Pertinent negatives include no chills, discharge, flank pain, hematuria, hesitancy, nausea, sweats or vomiting. The treatment provided mild relief.  Depression        This is a new problem.  The current episode started more than 1 year ago.   The onset quality is sudden.   The problem has been resolved since onset.  Associated symptoms include helplessness and insomnia.  Associated symptoms include no decreased concentration, no fatigue, no hopelessness, not irritable, no restlessness, no decreased interest, no appetite change, no myalgias, no headaches and not sad.( Panic attack)  Past treatments include SNRIs - Serotonin and norepinephrine reuptake inhibitors.  Compliance with treatment is variable.   Review of Systems  Constitutional: Negative.  Negative for appetite change, chills, fatigue, fever and unexpected weight change.  HENT: Negative for congestion, ear discharge, ear pain, rhinorrhea, sinus pressure, sneezing and sore throat.   Eyes: Negative for photophobia, pain, discharge, redness and itching.  Respiratory: Negative for cough, shortness of breath, wheezing and stridor.   Gastrointestinal: Negative for abdominal pain, blood in stool, constipation, diarrhea, nausea and vomiting.  Endocrine: Negative for cold intolerance, heat intolerance, polydipsia, polyphagia and polyuria.   Genitourinary: Positive for frequency and urgency. Negative for dysuria, flank pain, hematuria, hesitancy, menstrual problem, pelvic pain, vaginal bleeding and vaginal discharge.  Musculoskeletal: Negative for arthralgias, back pain and myalgias.  Skin: Negative for rash.  Allergic/Immunologic: Negative for environmental allergies and food allergies.  Neurological: Negative for dizziness, weakness, light-headedness, numbness and headaches.  Hematological: Negative for adenopathy. Does not bruise/bleed easily.  Psychiatric/Behavioral: Positive for depression. Negative for decreased concentration and dysphoric mood. The patient is nervous/anxious and has insomnia.     Patient Active Problem List   Diagnosis Date Noted  . Anxiety 09/02/2017  . 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 FH: diabetes mellitus 09/02/2017  . 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 Atypical nevus 09/02/2017  . IBS (irritable bowel syndrome) 09/02/2017  . Atrophic vaginitis 09/02/2017  . Chronic idiopathic urticaria 09/02/2017  . GERD (gastroesophageal reflux disease) 09/02/2017    Allergies  Allergen Reactions  . Citalopram Nausea And Vomiting  . Ivp Dye [Iodinated Diagnostic Agents] Swelling    hives  . Meloxicam     unknown  . Amlodipine Rash  . Ampicillin Rash  . Cefoxitin Rash  . Estradiol Rash  . Estropipate Rash  . Olmesartan Medoxomil-Hctz Rash  . Sertraline Rash       . Sulfa Antibiotics Rash  . Sulfamethoxazole Rash    Past Surgical History:  Procedure Laterality Date  . BREAST BIOPSY Left    needle bx/clip-neg  . CERVICAL BIOPSY  W/ LOOP ELECTRODE EXCISION    . CHOLECYSTECTOMY    . Colonoscopoy  2007, 2008, 2011  . COLONOSCOPY WITH PROPOFOL N/A 05/05/2018   Procedure: COLONOSCOPY WITH PROPOFOL;  Surgeon: Lin Landsman, MD;  Location: [facility] ENDOSCOPY;  Service: Gastroenterology;  Laterality: N/A;  . LEEP    . TUBAL LIGATION  1999     Social History   Tobacco Use  . Smoking status: Never Smoker  . Smokeless tobacco: Never Used  . Tobacco comment: smoking cessation materials not required  Substance Use Topics  . Alcohol use: Yes    Frequency: Never    Comment: rarely  . Drug use: No     Medication list has been reviewed and updated.  Current Meds  Medication Sig  . acetaminophen (TYLENOL) 500 MG tablet Take 1,000 mg by mouth every 8 (eight) hours as needed.  . cetirizine (ZYRTEC) 10 MG tablet Take 10 mg by mouth at bedtime.  . fluticasone (FLONASE) 50 MCG/ACT nasal spray Place 2 sprays into both nostrils daily.  Marland Kitchen omeprazole (PRILOSEC) 40 MG capsule Take 40 mg by mouth daily as needed.    PHQ 2/9 Scores 03/23/2019 01/06/2018 01/03/2018 09/02/2017  PHQ - 2 Score 3 0 1 2  PHQ- 9 Score 9 0 3 4    BP Readings from Last 3 Encounters:  03/23/19 120/60  06/30/18 (!) 161/84  06/08/18 (!) 190/88    Physical Exam Vitals signs and nursing note reviewed.  Constitutional:      General: She is not irritable.She is not in acute distress.    Appearance: She is not diaphoretic.  HENT:     Head: Normocephalic and atraumatic.     Right Ear: Tympanic membrane, ear canal and external ear normal.     Left Ear: Tympanic membrane, ear canal and external ear normal.     Nose: Nose normal. No congestion or rhinorrhea.     Mouth/Throat:     Mouth: Mucous membranes are moist.     Pharynx: Oropharynx is clear.  Eyes:     General:        Right eye: No discharge.        Left eye: No discharge.     Conjunctiva/sclera: Conjunctivae normal.     Pupils: Pupils are equal, round, and reactive to light.  Neck:     Musculoskeletal: Normal range of motion and neck supple.     Thyroid: No thyromegaly.     Vascular: No JVD.  Cardiovascular:     Rate and Rhythm: Normal rate and regular rhythm.     Pulses: Normal pulses.     Heart sounds: Normal heart sounds. No murmur. No friction rub. No gallop.   Pulmonary:     Effort: Pulmonary  effort is normal.     Breath sounds: Normal breath sounds. No wheezing, rhonchi or rales.  Chest:     Chest wall: No tenderness.  Abdominal:     General: Bowel sounds are normal.     Palpations: Abdomen is soft. There is no mass.     Tenderness: There is no abdominal tenderness. There is no guarding.     Hernia: No hernia is present.  Musculoskeletal: Normal range of motion.  Lymphadenopathy:     Cervical: No cervical adenopathy.  Skin:    General: Skin is warm and dry.     Capillary Refill: Capillary refill takes less than 2 seconds.  Neurological:     Mental Status: She is alert.     Deep Tendon Reflexes: Reflexes are normal and symmetric.     Wt Readings from Last 3 Encounters:  03/23/19 191 lb (86.6 kg)  06/30/18 182 lb (82.6 kg)  06/08/18 182 lb (82.6 kg)    BP  120/60   Pulse 80   Ht 5\' 3"  (1.6 m)   Wt 191 lb (86.6 kg)   BMI 33.83 kg/m   Assessment and Plan: 1. Acute cystitis without hematuria This is partially treated and unable to evaluate due to the patient's use of Azo.  On recheck of a urine culture earlier in the year noted that the organism was sensitive to almost everything however patient is allergic to several medications.  We would like to avoid Cipro at this time and we will reinstate nitrofurantoin at a smaller dosing of 50 mg 3-4 times a day over a 3-day.  Hopefully patient will be able to tolerate this and will call if this is unable to do so - nitrofurantoin (MACRODANTIN) 50 MG capsule; Take 1 capsule (50 mg total) by mouth 4 (four) times daily.  Dispense: 12 capsule; Refill: 0  2. Irritable bowel syndrome, unspecified type Patient has an irritable bowel syndrome that is followed by Dr. Nigel Bridgeman.  She was started on amitriptyline 50 mg but is been unable to take this at the current dosing we will resume at 25 mg at bedtime to see how to tolerate because the patient was able to tolerate at the tapering dosing. - amitriptyline (ELAVIL) 25 MG tablet; Take 1  tablet (25 mg total) by mouth at bedtime.  Dispense: 30 tablet; Refill: 1

## 2019-03-23 NOTE — Progress Notes (Signed)
Consulting regarding pt ca

## 2019-03-23 NOTE — Progress Notes (Signed)
As per our phone converstaion,  I recommend that you have a face to face evaluation of your symptoms to rule out complicated infections. Records reviewed show that you were treated for UTI 9 month ago with Nitrofurantoin, which you stated that you tolerated well with no side effects. Concern is that nausea and vomiting are due to complicated UTI. Also you stated that you have a history of vaginal atrophy and were told by your doctor in the past that you are more susceptible to kidney infection.

## 2019-04-17 ENCOUNTER — Telehealth: Payer: Self-pay | Admitting: Family Medicine

## 2019-04-17 ENCOUNTER — Other Ambulatory Visit: Payer: Self-pay

## 2019-04-17 DIAGNOSIS — N3000 Acute cystitis without hematuria: Secondary | ICD-10-CM

## 2019-04-17 NOTE — Telephone Encounter (Signed)
Patient was seen on 8/31 for uti, she finished her antibiotic  but still having some urgency.

## 2019-04-17 NOTE — Progress Notes (Unsigned)
Referral to urology

## 2019-04-17 NOTE — Telephone Encounter (Signed)
Due to pt being allergic to most medications and this being a reoccuring problem- we will be sending her to urology. I will put in the referral now. She may need to go to the urgent care to get through the weekend.

## 2019-04-21 ENCOUNTER — Ambulatory Visit
Admission: EM | Admit: 2019-04-21 | Discharge: 2019-04-21 | Disposition: A | Discharge status: Home / Self Care | Payer: Medicare Other | Attending: Urgent Care | Admitting: Urgent Care

## 2019-04-21 ENCOUNTER — Encounter: Payer: Self-pay | Admitting: Emergency Medicine

## 2019-04-21 ENCOUNTER — Other Ambulatory Visit: Payer: Self-pay

## 2019-04-21 DIAGNOSIS — N3000 Acute cystitis without hematuria: Secondary | ICD-10-CM | POA: Insufficient documentation

## 2019-04-21 DIAGNOSIS — N390 Urinary tract infection, site not specified: Secondary | ICD-10-CM | POA: Insufficient documentation

## 2019-04-21 LAB — URINALYSIS, COMPLETE (UACMP) WITH MICROSCOPIC
Bacteria, UA: NONE SEEN
Bilirubin Urine: NEGATIVE
Glucose, UA: NEGATIVE mg/dL
Ketones, ur: NEGATIVE mg/dL
Nitrite: NEGATIVE
Protein, ur: NEGATIVE mg/dL
Specific Gravity, Urine: 1.015 (ref 1.005–1.030)
pH: 6.5 (ref 5.0–8.0)

## 2019-04-21 MED ORDER — NITROFURANTOIN MACROCRYSTAL 50 MG PO CAPS: 50.0000 mg | ORAL_CAPSULE | Freq: Four times a day (QID) | ORAL | 0 refills | Status: DC

## 2019-04-21 MED ORDER — FLUCONAZOLE 150 MG PO TABS: ORAL_TABLET | ORAL | 0 refills | Status: DC

## 2019-04-21 NOTE — Discharge Instructions (Addendum)
It was very nice seeing you today in clinic. Thank you for entrusting me with your care.   As discussed, your urine is POSITIVE for infection. Will approach treatment as follows:  Prescription has been sent to your pharmacy for antibiotics.  Please pick up and take as directed. FINISH the entire course of medication even if you are feeling better.  A culture will be sent on your provided sample. If it comes back resistant to what I have prescribed you, someone will call you and let you know that we will need to change antibiotics. Increase fluid intake as much as possible to flush your urinary tract.  Water is always the best.  Avoid caffeine until your infection clears up, as it can contribute to painful bladder spasms.  May use Tylenol and/or Ibuprofen as needed for pain/fever. May use Azo products (over the counter) to help with pain/discomfort.   Make arrangements to follow up with your regular doctor in 1 week for re-evaluation. If your symptoms/condition worsens, please seek follow up care either here or in the ER. Please remember, our [place] providers are "right here with you" when you need us.   Again, it was my pleasure to take care of you today. Thank you for choosing our clinic. I hope that you start to feel better quickly.   Lezley Bedgood, MSN, APRN, FNP-C, CEN Advanced Practice Provider  [facility] 

## 2019-04-21 NOTE — ED Triage Notes (Signed)
Patient c/o urinary urgency that started 1 week ago. Denies other symptoms. Was just treated for a UTI about 1 month ago.

## 2019-04-21 NOTE — ED Provider Notes (Signed)
[place], [place]   Name: Carrie Roberson DOB: [birth year] MRN: [id number] CSN: [id number] PCP: Juline Patch, MD  Arrival date and time:  04/21/19 1019  Chief Complaint:  Urinary Urgency   NOTE: Prior to seeing the patient today, I have reviewed the triage nursing documentation and vital signs. Clinical staff has updated patient's PMH/PSHx, current medication list, and drug allergies/intolerances to ensure comprehensive history available to assist in medical decision making.   History:   HPI: Carrie Roberson is a 73 y.o. female who presents today with complaints of urinary symptoms that began with approximately 1 week ago. She complains of frequency, urgency, and mild dysuria. She has not appreciated any gross hematuria, nor has she noticed her urine being malodorous. Patient denies any associated nausea, vomiting, fever, and chills. She has not experienced any pain in her lower back or flank area. She notes suprapubic tenderness and pressure. She states, "it feels like it always does when I have a urinary tract infection. I get them a lot so I know what they feel like". 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 Patient was diagnosed with a UTI on 08/29/20220 via a tele-health visit. She was started on nitrofurantoin 100 mg BID, however she was unable to tolerate the dose. She was seen by her PCP Ronnald Ramp, MD) on 03/23/2019, at which time her antibiotic dose was reduced to 50 mg QID, which she advises that she tolerated well with no recurrent nausea or vomiting. Patient has been referred to urology. She will be seeing Dr. Diamantina Providence next week.   Past Medical History:  Diagnosis Date  . History of ischemic colitis 09/02/2017  . Hx of colonic polyp   . Hypertension   . IBS (irritable bowel syndrome)   . Ovarian cyst   . Vaginal atrophy     Past Surgical History:  Procedure Laterality Date  . BREAST  BIOPSY Left    needle bx/clip-neg  . CERVICAL BIOPSY  W/ LOOP ELECTRODE EXCISION    . CHOLECYSTECTOMY    . Colonoscopoy  2007, 2008, 2011  . COLONOSCOPY WITH PROPOFOL N/A 05/05/2018   Procedure: COLONOSCOPY WITH PROPOFOL;  Surgeon: Lin Landsman, MD;  Location: [facility] END[facility];  Service: Gastroenterology;  Laterality: N/A;  . LEEP    . TUBAL LIGATION  1999    Family History  Problem Relation Age of Onset  . Heart failure Mother   . Atrial fibrillation Mother   . Hypertension Mother   . Diabetes Father   . Heart attack Father   . Breast cancer Maternal Aunt 70  . Breast cancer Maternal Grandmother 80  . Breast cancer Cousin        2 mat cousins  . Hypertension Sister   . Hypertension Sister   . Arthritis Sister     Social History   Tobacco Use  . Smoking status: Never Smoker  . Smokeless tobacco: Never Used  . Tobacco comment: smoking cessation materials not required  Substance Use Topics  . Alcohol use: Yes    Frequency: Never    Comment: rarely  . Drug use: No    Patient Active Problem List   Diagnosis Date Noted  . Anxiety 09/02/2017  . 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 FH: diabetes mellitus 09/02/2017  . 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 Atypical nevus 09/02/2017  . IBS (irritable bowel syndrome) 09/02/2017  .  Atrophic vaginitis 09/02/2017  . Chronic idiopathic urticaria 09/02/2017  . GERD (gastroesophageal reflux disease) 09/02/2017    Home Medications:    Current Meds  Medication Sig  . acetaminophen (TYLENOL) 500 MG tablet Take 1,000 mg by mouth every 8 (eight) hours as needed.  Marland Kitchen amitriptyline (ELAVIL) 25 MG tablet Take 1 tablet (25 mg total) by mouth at bedtime.  . cetirizine (ZYRTEC) 10 MG tablet Take 10 mg by mouth at bedtime.  . fluticasone (FLONASE) 50 MCG/ACT nasal spray Place 2 sprays into both nostrils daily.  Marland Kitchen omeprazole (PRILOSEC) 40 MG capsule Take 40 mg by mouth daily as needed.     Allergies:   Citalopram, Ivp dye [iodinated diagnostic agents], Meloxicam, Amlodipine, Ampicillin, Cefoxitin, Estradiol, Estropipate, Olmesartan medoxomil-hctz, Sertraline, Sulfa antibiotics, and Sulfamethoxazole  Review of Systems (ROS): Review of Systems  Constitutional: Negative for chills and fever.  Respiratory: Negative for cough and shortness of breath.   Cardiovascular: Negative for chest pain and palpitations.  Gastrointestinal: Positive for abdominal pain (pressure). Negative for nausea and vomiting.  Genitourinary: Positive for dysuria (mild), frequency and urgency. Negative for hematuria.  Musculoskeletal: Negative for back pain.  Neurological: Negative for dizziness, weakness and headaches.  All other systems reviewed and are negative.    Vital Signs: Today's Vitals   04/21/19 1046 04/21/19 1049 04/21/19 1137  BP:  (!) 164/97   Pulse:  (!) 101   Resp:  18   Temp:  98.4 F (36.9 C)   TempSrc:  Oral   SpO2:  100%   Weight: 190 lb (86.2 kg)    Height: 5\' 3"  (1.6 m)    PainSc: 0-No pain  0-No pain    Physical Exam: Physical Exam  Constitutional: She is oriented to person, place, and time and well-developed, well-nourished, and in no distress.  HENT:  Head: Normocephalic and atraumatic.  Mouth/Throat: Mucous membranes are normal.  Eyes: Pupils are equal, round, and reactive to light. EOM are normal.  Neck: Normal range of motion. Neck supple.  Cardiovascular: Normal rate, regular rhythm, normal heart sounds and intact distal pulses. Exam reveals no gallop and no friction rub.  No murmur heard. Pulmonary/Chest: Effort normal and breath sounds normal. No respiratory distress. She has no wheezes. She has no rales.  Abdominal: Soft. Normal appearance and bowel sounds are normal. There is abdominal tenderness in the suprapubic area. There is no CVA tenderness.  Neurological: She is alert and oriented to person, place, and time. Gait normal.  Skin: Skin is warm and  dry. No rash noted.  Psychiatric: Mood, memory, affect and judgment normal.  Nursing note and vitals reviewed.   Urgent Care Treatments / Results:   LABS: PLEASE NOTE: all labs that were ordered this encounter are listed, however only abnormal results are displayed. Labs Reviewed  URINALYSIS, COMPLETE (UACMP) WITH MICROSCOPIC - Abnormal; Notable for the following components:      Result Value   Color, Urine STRAW (*)    Hgb urine dipstick SMALL (*)    Leukocytes,Ua SMALL (*)    All other components within normal limits  URINE CULTURE    EKG: -None  RADIOLOGY: No results found.  PROCEDURES: Procedures  MEDICATIONS RECEIVED THIS VISIT: Medications - No data to display  PERTINENT CLINICAL COURSE NOTES/UPDATES:   Initial Impression / Assessment and Plan / Urgent Care Course:  Pertinent labs & imaging results that were available during my care of the patient were personally reviewed by me and considered in my medical decision making (see lab/imaging  section of note for values and interpretations).  Doniesha Rhines is a 73 y.o. female who presents to [facility] today with complaints of urinary symptoms.   Patient is well appearing overall in clinic today. She does not appear to be in any acute distress. Presenting symptoms (see HPI) and exam as documented above. UA was weakly (+) for infection; reflex culture sent. Given her symptoms and history of recurrent infection, will proceed with treatment with a 5 day course of nitrofurantoin (50 mg QID). Patient encouraged to complete the entire course of antibiotics even if she begins to feel better. She was advised that if culture demonstrates resistance to the prescribed antibiotic, she will be contacted and advised of the need to change the antibiotic being used to treat her infection. Patient encouraged to increase her fluid intake as much as possible. Discussed that water is always best to flush the urinary tract. She was advised to  avoid caffeine containing fluids until her infections clears, as caffeine can cause her to experience painful bladder spasms. May use Tylenol and/or Ibuprofen as needed for pain/fever. May also use over the counter phenazopyridine to help relieve her current urinary pain. Patient reports that she frequently gets vulvovaginal candidiasis when on antibiotics. Will send in prophylactic fluconazole for PRN use.   Discussed follow up with urology Diamantina Providence, MD) as already scheduled next week. I have reviewed the follow up and strict return precautions for any new or worsening symptoms. Patient is aware of symptoms that would be deemed urgent/emergent, and would thus require further evaluation either here or in the emergency department. At the time of discharge, she verbalized understanding and consent with the discharge plan as it was reviewed with her. All questions were fielded by provider and/or clinic staff prior to patient discharge.    Final Clinical Impressions / Urgent Care Diagnoses:   Final diagnoses:  Urinary tract infection without hematuria, site unspecified    New Prescriptions:  [place] Controlled Substance Registry consulted? Not Applicable  Meds ordered this encounter  Medications  . nitrofurantoin (MACRODANTIN) 50 MG capsule    Sig: Take 1 capsule (50 mg total) by mouth 4 (four) times daily.    Dispense:  20 capsule    Refill:  0  . fluconazole (DIFLUCAN) 150 MG tablet    Sig: Take 1 tablet (150 mg) PO x 1 dose. May repeat 150 mg dose in 3 days if still symptomatic.    Dispense:  2 tablet    Refill:  0    Recommended Follow up Care:  Patient encouraged to follow up with the following provider within the specified time frame, or sooner as dictated by the severity of her symptoms. As always, she was instructed that for any urgent/emergent care needs, she should seek care either here or in the emergency department for more immediate evaluation.  Follow-up Information    Billey Co, MD.   Specialty: Urology Why: As already scheduled for evaluation of recurrent UTI. Contact information: [place] [address] [phone]         NOTE: This note was prepared using Dragon dictation software along with smaller phrase technology. Despite my best ability to proofread, there is the potential that transcriptional errors may still occur from this process, and are completely unintentional.    Karen Kitchens, NP 04/21/19 2246

## 2019-04-23 LAB — URINE CULTURE: Culture: 50000 — AB

## 2019-04-24 ENCOUNTER — Telehealth (HOSPITAL_COMMUNITY): Payer: Self-pay | Admitting: Emergency Medicine

## 2019-04-24 NOTE — Telephone Encounter (Signed)
Urine culture was positive for e coli and was given nitrofuratoin  at urgent care visit. Pt contacted and made aware, educated on completing antibiotic and to follow up if symptoms are persistent. Verbalized understanding.  She is following up with urology

## 2019-04-27 ENCOUNTER — Other Ambulatory Visit: Payer: Self-pay | Admitting: Family Medicine

## 2019-04-27 DIAGNOSIS — N3000 Acute cystitis without hematuria: Secondary | ICD-10-CM

## 2019-04-28 ENCOUNTER — Ambulatory Visit (INDEPENDENT_AMBULATORY_CARE_PROVIDER_SITE_OTHER): Payer: Medicare Other | Admitting: Urology

## 2019-04-28 ENCOUNTER — Other Ambulatory Visit: Payer: Self-pay

## 2019-04-28 ENCOUNTER — Encounter: Payer: Self-pay | Admitting: Urology

## 2019-04-28 ENCOUNTER — Other Ambulatory Visit
Admission: RE | Admit: 2019-04-28 | Discharge: 2019-04-28 | Disposition: A | Discharge status: Home / Self Care | Payer: Medicare Other | Attending: Urology | Admitting: Urology

## 2019-04-28 VITALS — BP 150/79 | HR 101 | Ht 63.0 in | Wt 190.0 lb

## 2019-04-28 DIAGNOSIS — N390 Urinary tract infection, site not specified: Secondary | ICD-10-CM | POA: Diagnosis not present

## 2019-04-28 DIAGNOSIS — N3000 Acute cystitis without hematuria: Secondary | ICD-10-CM | POA: Diagnosis not present

## 2019-04-28 LAB — URINALYSIS, COMPLETE (UACMP) WITH MICROSCOPIC
Bilirubin Urine: NEGATIVE
Glucose, UA: NEGATIVE mg/dL
Ketones, ur: NEGATIVE mg/dL
Leukocytes,Ua: NEGATIVE
Nitrite: NEGATIVE
Protein, ur: NEGATIVE mg/dL
Specific Gravity, Urine: 1.01 (ref 1.005–1.030)
pH: 6 (ref 5.0–8.0)

## 2019-04-28 NOTE — Patient Instructions (Addendum)
1. Start cranberry tablets twice daily  Urinary Tract Infection, Adult  A urinary tract infection (UTI) is an infection of any part of the urinary tract. The urinary tract includes the kidneys, ureters, bladder, and urethra. These organs make, store, and get rid of urine in the body. Your health care provider may use other names to describe the infection. An upper UTI affects the ureters and kidneys (pyelonephritis). A lower UTI affects the bladder (cystitis) and urethra (urethritis). What are the causes? Most urinary tract infections are caused by bacteria in your genital area, around the entrance to your urinary tract (urethra). These bacteria grow and cause inflammation of your urinary tract. What increases the risk? You are more likely to develop this condition if:  You have a urinary catheter that stays in place (indwelling).  You are not able to control when you urinate or have a bowel movement (you have incontinence).  You are female and you: ? Use a spermicide or diaphragm for birth control. ? Have low estrogen levels. ? Are pregnant.  You have certain genes that increase your risk (genetics).  You are sexually active.  You take antibiotic medicines.  You have a condition that causes your flow of urine to slow down, such as: ? An enlarged prostate, if you are female. ? Blockage in your urethra (stricture). ? A kidney stone. ? A nerve condition that affects your bladder control (neurogenic bladder). ? Not getting enough to drink, or not urinating often.  You have certain medical conditions, such as: ? Diabetes. ? A weak disease-fighting system (immunesystem). ? Sickle cell disease. ? Gout. ? Spinal cord injury. What are the signs or symptoms? Symptoms of this condition include:  Needing to urinate right away (urgently).  Frequent urination or passing small amounts of urine frequently.  Pain or burning with urination.  Blood in the urine.  Urine that smells bad  or unusual.  Trouble urinating.  Cloudy urine.  Vaginal discharge, if you are female.  Pain in the abdomen or the lower back. You may also have:  Vomiting or a decreased appetite.  Confusion.  Irritability or tiredness.  A fever.  Diarrhea. The first symptom in older adults may be confusion. In some cases, they may not have any symptoms until the infection has worsened. How is this diagnosed? This condition is diagnosed based on your medical history and a physical exam. You may also have other tests, including:  Urine tests.  Blood tests.  Tests for sexually transmitted infections (STIs). If you have had more than one UTI, a cystoscopy or imaging studies may be done to determine the cause of the infections. How is this treated? Treatment for this condition includes:  Antibiotic medicine.  Over-the-counter medicines to treat discomfort.  Drinking enough water to stay hydrated. If you have frequent infections or have other conditions such as a kidney stone, you may need to see a health care provider who specializes in the urinary tract (urologist). In rare cases, urinary tract infections can cause sepsis. Sepsis is a life-threatening condition that occurs when the body responds to an infection. Sepsis is treated in the hospital with IV antibiotics, fluids, and other medicines. Follow these instructions at home:  Medicines  Take over-the-counter and prescription medicines only as told by your health care provider.  If you were prescribed an antibiotic medicine, take it as told by your health care provider. Do not stop using the antibiotic even if you start to feel better. General instructions  Make sure  you: ? Empty your bladder often and completely. Do not hold urine for long periods of time. ? Empty your bladder after sex. ? Wipe from front to back after a bowel movement if you are female. Use each tissue one time when you wipe.  Drink enough fluid to keep your  urine pale yellow.  Keep all follow-up visits as told by your health care provider. This is important. Contact a health care provider if:  Your symptoms do not get better after 1-2 days.  Your symptoms go away and then return. Get help right away if you have:  Severe pain in your back or your lower abdomen.  A fever.  Nausea or vomiting. Summary  A urinary tract infection (UTI) is an infection of any part of the urinary tract, which includes the kidneys, ureters, bladder, and urethra.  Most urinary tract infections are caused by bacteria in your genital area, around the entrance to your urinary tract (urethra).  Treatment for this condition often includes antibiotic medicines.  If you were prescribed an antibiotic medicine, take it as told by your health care provider. Do not stop using the antibiotic even if you start to feel better.  Keep all follow-up visits as told by your health care provider. This is important. This information is not intended to replace advice given to you by your health care provider. Make sure you discuss any questions you have with your health care provider. Document Released: 04/18/2005 Document Revised: 06/26/2018 Document Reviewed: 01/16/2018 Elsevier Patient Education  2020 [employer].

## 2019-04-28 NOTE — Progress Notes (Signed)
04/28/19 1:35 PM   Carrie Roberson [birth year] [id number]  Referring provider: Juline Patch, MD [address] [place],  [place] [address]  CC: rUTIs  HPI: I saw Carrie Roberson in neurology clinic in consultation for urinary tract infections from Dr. Ronnald Ramp.  She is a 73 year old female with past medical history notable for IBS who has had 3 UTIs over the last 3 years.  She denies any UTIs prior to this.  She is not sexually active.  Her symptoms of UTI are typically dysuria, urgency, frequency, and pelvic pain.  Her most recent UTI was E. coli on 04/21/2019 which was treated with culture appropriate nitrofurantoin.  She has a number of allergies to antibiotics.  She is still having some mild pelvic pressure, however her urinalysis is benign today with 6-10 squamous cells, nitrite negative, 0 WBCs, 0 RBCs, and few bacteria.  She has known vaginal atrophy, however has an allergy to topical estrogen.  She denies any gross hematuria, pneumaturia, or urinary incontinence.  She denies any flank pain or history of nephrolithiasis.  She denies any current constipation, but is having some problems with diarrhea from her IBS at this time.  There are no aggravating or alleviating factors.  Severity is mild.   PMH: Past Medical History:  Diagnosis Date  . History of ischemic colitis 09/02/2017  . Hx of colonic polyp   . Hypertension   . IBS (irritable bowel syndrome)   . Ovarian cyst   . Vaginal atrophy     Surgical History: Past Surgical History:  Procedure Laterality Date  . BREAST BIOPSY Left    needle bx/clip-neg  . CERVICAL BIOPSY  W/ LOOP ELECTRODE EXCISION    . CHOLECYSTECTOMY    . Colonoscopoy  2007, 2008, 2011  . COLONOSCOPY WITH PROPOFOL N/A 05/05/2018   Procedure: COLONOSCOPY WITH PROPOFOL;  Surgeon: Lin Landsman, MD;  Location: [facility] ENDOSCOPY;  Service: Gastroenterology;  Laterality: N/A;  . LEEP    . TUBAL LIGATION  1999    Allergies:  Allergies  Allergen Reactions   . Citalopram Nausea And Vomiting  . Ivp Dye [Iodinated Diagnostic Agents] Swelling    hives  . Meloxicam     unknown  . Amlodipine Rash  . Ampicillin Rash  . Cefoxitin Rash  . Estradiol Rash  . Estropipate Rash  . Olmesartan Medoxomil-Hctz Rash  . Sertraline Rash       . Sulfa Antibiotics Rash  . Sulfamethoxazole Rash    Family History: Family History  Problem Relation Age of Onset  . Heart failure Mother   . Atrial fibrillation Mother   . Hypertension Mother   . Diabetes Father   . Heart attack Father   . Breast cancer Maternal Aunt 70  . Breast cancer Maternal Grandmother 80  . Breast cancer Cousin        2 mat cousins  . Hypertension Sister   . Hypertension Sister   . Arthritis Sister     Social History:  reports that she has never smoked. She has never used smokeless tobacco. She reports current alcohol use. She reports that she does not use drugs.  ROS: Please see flowsheet from today's date for complete review of systems.  Physical Exam: BP (!) 150/79   Pulse (!) 101   Ht 5\' 3"  (1.6 m)   Wt 190 lb (86.2 kg)   BMI 33.66 kg/m    Constitutional:  Alert and oriented, No acute distress. Cardiovascular: No clubbing, cyanosis, or edema. Respiratory:  Normal respiratory effort, no increased work of breathing. GI: Abdomen is soft, nontender, nondistended, no abdominal masses Lymph: No cervical or inguinal lymphadenopathy. Skin: No rashes, bruises or suspicious lesions. Neurologic: Grossly intact, no focal deficits, moving all 4 extremities. Psychiatric: Normal mood and affect.  Laboratory Data: E Coli UTIs 04/21/19, 06/08/18  Pertinent Imaging: None to review  Assessment & Plan:   In summary, the patient is a 73 year old female with 3 UTIs over the last year, including 2 E. coli UTIs in the last 11 months.  We discussed the evaluation and treatment of patients with recurrent UTIs at length.  We specifically discussed the differences between asymptomatic  bacteriuria and true urinary tract infection.  We discussed the AUA definition of recurrent UTI of at least 2 culture proven symptomatic acute cystitis episodes in a 36-month period, or 3 within a 1 year period.  We discussed the importance of culture directed antibiotic treatment, and antibiotic stewardship.  First-line therapy includes nitrofurantoin(5 days), Bactrim(3 days), or fosfomycin(3 g single dose).  Possible etiologies of recurrent infection include periurethral tissue atrophy in postmenopausal woman, constipation, sexual activity, incomplete emptying, anatomic abnormalities, and even genetic predisposition.  Finally, we discussed the role of perineal hygiene, timed voiding, adequate hydration, topical vaginal estrogen, cranberry prophylaxis, and low-dose antibiotic prophylaxis.  -Start cranberry tablet prophylaxis twice daily -Would recommend Cipro for antibiotic for any UTI in the future as has much better tissue penetration compared to nitrofurantoin -Follow-up as needed  Billey Co, MD  [place] [address] [place], [place] [address] [phone]

## 2019-05-07 ENCOUNTER — Telehealth: Payer: Self-pay | Admitting: Urology

## 2019-05-07 NOTE — Telephone Encounter (Signed)
Pt saw [facility] in [place] on 10/6.  She has been taking Aleve to relieve pain and pressure.  She said she's still in pain and thinks she needs a follow up w/Sn[facility] to do something different.

## 2019-05-07 NOTE — Telephone Encounter (Signed)
Agree, thanks  Nickolas Madrid, MD 05/07/2019

## 2019-05-07 NOTE — Progress Notes (Signed)
05/09/19 1:28 PM   Carrie Roberson 05/24/1946 [id number]  Referring provider: Juline Patch, MD [address] [place],  [place] [address]  CC: rUTIs  HPI: Carrie Roberson is a 73 year old female with rUTI's who presents today for complaints of urinary pressure and pain.    Background history She is a 73 year old female with past medical history notable for IBS who has had 3 UTIs over the last 3 years.  She denies any UTIs prior to this.  She is not sexually active.  Her symptoms of UTI are typically dysuria, urgency, frequency, and pelvic pain.  Her most recent UTI was E. coli on 04/21/2019 which was treated with culture appropriate nitrofurantoin.  She has a number of allergies to antibiotics.  She has known vaginal atrophy, however has an allergy to topical estrogen.  She denies any gross hematuria, pneumaturia, or urinary incontinence.  She denies any flank pain or history of nephrolithiasis.  She denies any current constipation, but is having some problems with diarrhea from her IBS at this time.  There are no aggravating or alleviating factors.  Severity is mild.  Today, she is experiencing frequency, urgency and getting up at night to urinate.  She has increased pelvic pressure.  She does not have incontinence.  Patient denies any gross hematuria, dysuria or suprapubic/flank pain.  Patient denies any fevers, chills, nausea or vomiting.  Her UA noted many bacteria, but otherwise benign.  Her PVR was 219.    PMH: Past Medical History:  Diagnosis Date  . History of ischemic colitis 09/02/2017  . Hx of colonic polyp   . Hypertension   . IBS (irritable bowel syndrome)   . Ovarian cyst   . Vaginal atrophy     Surgical History: Past Surgical History:  Procedure Laterality Date  . BREAST BIOPSY Left    needle bx/clip-neg  . CERVICAL BIOPSY  W/ LOOP ELECTRODE EXCISION    . CHOLECYSTECTOMY    . Colonoscopoy  2007, 2008, 2011  . COLONOSCOPY WITH PROPOFOL N/A 05/05/2018   Procedure:  COLONOSCOPY WITH PROPOFOL;  Surgeon: Lin Landsman, MD;  Location: [facility] ENDOSCOPY;  Service: Gastroenterology;  Laterality: N/A;  . LEEP    . TUBAL LIGATION  1999    Allergies:  Allergies  Allergen Reactions  . Citalopram Nausea And Vomiting  . Ivp Dye [Iodinated Diagnostic Agents] Swelling    hives  . Meloxicam     unknown  . Amlodipine Rash  . Ampicillin Rash  . Cefoxitin Rash  . Estradiol Rash  . Estropipate Rash  . Olmesartan Medoxomil-Hctz Rash  . Sertraline Rash       . Sulfa Antibiotics Rash  . Sulfamethoxazole Rash    Family History: Family History  Problem Relation Age of Onset  . Heart failure Mother   . Atrial fibrillation Mother   . Hypertension Mother   . Diabetes Father   . Heart attack Father   . Breast cancer Maternal Aunt 70  . Breast cancer Maternal Grandmother 80  . Breast cancer Cousin        2 mat cousins  . Hypertension Sister   . Hypertension Sister   . Arthritis Sister   . Prostate cancer Neg Hx   . Kidney cancer Neg Hx     Social History:  reports that she has never smoked. She has never used smokeless tobacco. She reports current alcohol use. She reports that she does not use drugs.  ROS: Please see flowsheet from today's  date for complete review of systems.  Physical Exam: BP (!) 153/80   Pulse (!) 114   Ht 5\' 3"  (1.6 m)   Wt 191 lb (86.6 kg)   BMI 33.83 kg/m   Constitutional:  Well nourished. Alert and oriented, No acute distress. HEENT: [place] AT, moist mucus membranes.  Trachea midline, no masses. Cardiovascular: No clubbing, cyanosis, or edema. Respiratory: Normal respiratory effort, no increased work of breathing. GI: Abdomen is soft, non tender, non distended, no abdominal masses. Liver and spleen not palpable.  No hernias appreciated.  Stool sample for occult testing is not indicated.   GU: No CVA tenderness.  No bladder fullness or masses.  Atrophic external genitalia, sparse pubic hair distribution, no lesions.   Normal urethral meatus, no lesions, no prolapse, no discharge.   No urethral masses, tenderness and/or tenderness. No bladder fullness, tenderness or masses. pale vagina mucosa, poor estrogen effect, no discharge, no lesions, fair pelvic support, no cystocele and no rectocele noted.  No cervical motion tenderness.  Uterus is freely mobile and non-fixed.  No adnexal/parametria masses or tenderness noted.  Anus and perineum are without rashes or lesions.   Pelvic exam was very uncomfortable for her.  Skin: No rashes, bruises or suspicious lesions. Lymph: No inguinal adenopathy. Neurologic: Grossly intact, no focal deficits, moving all 4 extremities. Psychiatric: Normal mood and affect.   Laboratory Data: E Coli UTIs 04/21/19, 06/08/18  Pertinent Imaging: None to review  Assessment & Plan:    1. Pelvic pressure UA many bacteria - will send for culture to rule out indolent infection   2. Incomplete emptying Will start Flomax 0.4 mg daily She will likely not be able to self cath due to her vaginal atrophy and discomfort during pelvic exam RTC in 2 weeks for symptoms recheck and PVR  Zara Council, PA-C  [facility] [address] [place], [place] [address] [phone]

## 2019-05-07 NOTE — Telephone Encounter (Signed)
Spoke with patient and offered an appointment with La[facility] for tomorrow for further evaluation per Dr. Diamantina Providence

## 2019-05-08 ENCOUNTER — Other Ambulatory Visit: Payer: Self-pay

## 2019-05-08 ENCOUNTER — Ambulatory Visit (INDEPENDENT_AMBULATORY_CARE_PROVIDER_SITE_OTHER): Payer: Medicare Other | Admitting: Urology

## 2019-05-08 ENCOUNTER — Encounter: Payer: Self-pay | Admitting: Urology

## 2019-05-08 VITALS — BP 153/80 | HR 114 | Ht 63.0 in | Wt 191.0 lb

## 2019-05-08 DIAGNOSIS — R102 Pelvic and perineal pain: Secondary | ICD-10-CM | POA: Diagnosis not present

## 2019-05-08 DIAGNOSIS — R309 Painful micturition, unspecified: Secondary | ICD-10-CM | POA: Diagnosis not present

## 2019-05-08 DIAGNOSIS — R339 Retention of urine, unspecified: Secondary | ICD-10-CM | POA: Diagnosis not present

## 2019-05-08 LAB — URINALYSIS, COMPLETE
Bilirubin, UA: NEGATIVE
Glucose, UA: NEGATIVE
Ketones, UA: NEGATIVE
Nitrite, UA: NEGATIVE
Specific Gravity, UA: 1.02 (ref 1.005–1.030)
Urobilinogen, Ur: 0.2 mg/dL (ref 0.2–1.0)
pH, UA: 6 (ref 5.0–7.5)

## 2019-05-08 LAB — MICROSCOPIC EXAMINATION
Epithelial Cells (non renal): >10 /hpf — AB (ref 0–10)
RBC, Urine: NONE SEEN /hpf (ref 0–2)

## 2019-05-08 LAB — BLADDER SCAN AMB NON-IMAGING

## 2019-05-08 MED ORDER — TAMSULOSIN HCL 0.4 MG PO CAPS: 0.4000 mg | ORAL_CAPSULE | Freq: Every day | ORAL | 3 refills | Status: DC

## 2019-05-11 LAB — CULTURE, URINE COMPREHENSIVE

## 2019-05-15 ENCOUNTER — Other Ambulatory Visit: Payer: Self-pay | Admitting: Family Medicine

## 2019-05-15 DIAGNOSIS — K589 Irritable bowel syndrome without diarrhea: Secondary | ICD-10-CM

## 2019-05-19 ENCOUNTER — Telehealth: Payer: Self-pay | Admitting: Urology

## 2019-05-19 NOTE — Telephone Encounter (Signed)
Patient cx her app and wanted you to know that she is no longer having the problem and it cleared up without taking the medication    Carrie Roberson

## 2019-05-22 ENCOUNTER — Ambulatory Visit: Payer: Medicare Other | Admitting: Urology

## 2019-06-02 ENCOUNTER — Encounter: Payer: Self-pay | Admitting: Emergency Medicine

## 2019-06-02 ENCOUNTER — Other Ambulatory Visit: Payer: Self-pay

## 2019-06-02 ENCOUNTER — Ambulatory Visit
Admission: EM | Admit: 2019-06-02 | Discharge: 2019-06-02 | Disposition: A | Discharge status: Home / Self Care | Payer: Medicare Other | Attending: Emergency Medicine | Admitting: Emergency Medicine

## 2019-06-02 DIAGNOSIS — R319 Hematuria, unspecified: Secondary | ICD-10-CM

## 2019-06-02 DIAGNOSIS — N760 Acute vaginitis: Secondary | ICD-10-CM | POA: Insufficient documentation

## 2019-06-02 DIAGNOSIS — N390 Urinary tract infection, site not specified: Secondary | ICD-10-CM | POA: Insufficient documentation

## 2019-06-02 LAB — URINALYSIS, COMPLETE (UACMP) WITH MICROSCOPIC
Bilirubin Urine: NEGATIVE
Glucose, UA: NEGATIVE mg/dL
Ketones, ur: NEGATIVE mg/dL
Nitrite: POSITIVE — AB
Specific Gravity, Urine: 1.02 (ref 1.005–1.030)
WBC, UA: >50 WBC/hpf (ref 0–5)
pH: 6 (ref 5.0–8.0)

## 2019-06-02 MED ORDER — FLUCONAZOLE 150 MG PO TABS: 150.0000 mg | ORAL_TABLET | Freq: Every day | ORAL | 0 refills | Status: DC

## 2019-06-02 MED ORDER — CIPROFLOXACIN HCL 500 MG PO TABS: 500.0000 mg | ORAL_TABLET | Freq: Two times a day (BID) | ORAL | 0 refills | Status: AC

## 2019-06-02 NOTE — ED Triage Notes (Signed)
Pt c/o urinary frequency, pressure in the bladder area. Started about 2 days ago. She has h/o UTI's.

## 2019-06-02 NOTE — Discharge Instructions (Addendum)
Take medication as prescribed. Rest. Drink plenty of fluids.   Follow up with your urologist.   Follow up with your primary care physician this week as needed. Return to Urgent care for new or worsening concerns.

## 2019-06-02 NOTE — ED Provider Notes (Signed)
[facility] ____________________________________________  Time seen: Approximately 10:16 AM  I have reviewed the triage vital signs and the nursing notes.   HISTORY  Chief Complaint Urinary Frequency   HPI Aveanna Orrick is a 73 y.o. female presenting for evaluation of urinary frequency, urgency and pressure present for last 2 days.  States pressure is low midline.  States this feels consistent with her previous urinary infections.  Denies other abdominal discomfort, back pain, fevers, vomiting or diarrhea.  Continues to eat and drink well.  Denies recent cough, sickness or fevers.  Denies aggravating or alleviating factors.  Also reports occasional vaginal irritation consistent with previous yeast infections.  Denies vaginal discharge or pain.  Juline Patch, MD: PCP   Past Medical History:  Diagnosis Date  . History of ischemic colitis 09/02/2017  . Hx of colonic polyp   . Hypertension   . IBS (irritable bowel syndrome)   . Ovarian cyst   . Vaginal atrophy     Patient Active Problem List   Diagnosis Date Noted  . Anxiety 09/02/2017  . 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 FH: diabetes mellitus 09/02/2017  . 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 Atypical nevus 09/02/2017  . IBS (irritable bowel syndrome) 09/02/2017  . Atrophic vaginitis 09/02/2017  . Chronic idiopathic urticaria 09/02/2017  . GERD (gastroesophageal reflux disease) 09/02/2017    Past Surgical History:  Procedure Laterality Date  . BREAST BIOPSY Left    needle bx/clip-neg  . CERVICAL BIOPSY  W/ LOOP ELECTRODE EXCISION    . CHOLECYSTECTOMY    . Colonoscopoy  2007, 2008, 2011  . COLONOSCOPY WITH PROPOFOL N/A 05/05/2018   Procedure: COLONOSCOPY WITH PROPOFOL;  Surgeon: Lin Landsman, MD;  Location: [facility] ENDOSCOPY;  Service: Gastroenterology;  Laterality: N/A;  . LEEP    . TUBAL LIGATION  1999     No current facility-administered  medications for this encounter.   Current Outpatient Medications:  .  acetaminophen (TYLENOL) 500 MG tablet, Take 1,000 mg by mouth every 8 (eight) hours as needed., Disp: , Rfl:  .  amitriptyline (ELAVIL) 25 MG tablet, TAKE 1 TABLET BY MOUTH EVERYDAY AT BEDTIME, Disp: 30 tablet, Rfl: 1 .  cetirizine (ZYRTEC) 10 MG tablet, Take 10 mg by mouth at bedtime., Disp: , Rfl:  .  fluticasone (FLONASE) 50 MCG/ACT nasal spray, Place 2 sprays into both nostrils daily., Disp: 16 g, Rfl: 0 .  omeprazole (PRILOSEC) 40 MG capsule, Take 40 mg by mouth daily as needed., Disp: , Rfl:  .  ciprofloxacin (CIPRO) 500 MG tablet, Take 1 tablet (500 mg total) by mouth every 12 (twelve) hours for 7 days., Disp: 14 tablet, Rfl: 0 .  fluconazole (DIFLUCAN) 150 MG tablet, Take 1 tablet (150 mg total) by mouth daily. Take one pill orally, then Repeat in one week as needed., Disp: 2 tablet, Rfl: 0 .  tamsulosin (FLOMAX) 0.4 MG CAPS capsule, Take 1 capsule (0.4 mg total) by mouth daily., Disp: 30 capsule, Rfl: 3  Allergies Citalopram, Ivp dye [iodinated diagnostic agents], Meloxicam, Amlodipine, Ampicillin, Cefoxitin, Estradiol, Estropipate, Olmesartan medoxomil-hctz, Sertraline, Sulfa antibiotics, and Sulfamethoxazole  Family History  Problem Relation Age of Onset  . Heart failure Mother   . Atrial fibrillation Mother   . Hypertension Mother   . Diabetes Father   . Heart attack Father   . Breast cancer Maternal Aunt 70  . Breast cancer Maternal Grandmother 80  . Breast cancer Cousin  2 mat cousins  . Hypertension Sister   . Hypertension Sister   . Arthritis Sister   . Prostate cancer Neg Hx   . Kidney cancer Neg Hx     Social History Social History   Tobacco Use  . Smoking status: Never Smoker  . Smokeless tobacco: Never Used  . Tobacco comment: smoking cessation materials not required  Substance Use Topics  . Alcohol use: Yes    Frequency: Never    Comment: rarely  . Drug use: No    Review of  Systems Constitutional: No fever Cardiovascular: Denies chest pain. Respiratory: Denies shortness of breath. Gastrointestinal: No abdominal pain.  No nausea, no vomiting. Genitourinary: Positive for dysuria. Musculoskeletal: Negative for back pain. Skin: Negative for rash.  ____________________________________________   PHYSICAL EXAM:  VITAL SIGNS: ED Triage Vitals  Enc Vitals Group     BP 06/02/19 0957 (!) 153/95     Pulse Rate 06/02/19 0957 83     Resp 06/02/19 0957 18     Temp 06/02/19 0957 98.1 F (36.7 C)     Temp Source 06/02/19 0957 Oral     SpO2 06/02/19 0957 98 %     Weight 06/02/19 0954 190 lb (86.2 kg)     Height 06/02/19 0954 5\' 3"  (1.6 m)     Head Circumference --      Peak Flow --      Pain Score 06/02/19 0954 7     Pain Loc --      Pain Edu? --      Excl. in [place]? --     Constitutional: Alert and oriented. Well appearing and in no acute distress. Eyes: Conjunctivae are normal.  ENT      Head: Normocephalic and atraumatic. Cardiovascular: Normal rate, regular rhythm. Grossly normal heart sounds.  Good peripheral circulation. Respiratory: Normal respiratory effort without tachypnea nor retractions. Breath sounds are clear and equal bilaterally. No wheezes, rales, rhonchi. GastrointestinalNo CVA tenderness.  Mild midline suprapubic pressure, abdomen otherwise soft and nontender. Musculoskeletal:  Steady gait.  Neurologic:  Normal speech and language. Speech is normal. Skin:  Skin is warm, dry and intact. No rash noted. Psychiatric: Mood and affect are normal. Speech and behavior are normal. Patient exhibits appropriate insight and judgment   ___________________________________________   LABS (all labs ordered are listed, but only abnormal results are displayed)  Labs Reviewed  URINALYSIS, COMPLETE (UACMP) WITH MICROSCOPIC - Abnormal; Notable for the following components:      Result Value   APPearance CLOUDY (*)    Hgb urine dipstick MODERATE (*)     Protein, ur TRACE (*)    Nitrite POSITIVE (*)    Leukocytes,Ua LARGE (*)    Bacteria, UA MANY (*)    All other components within normal limits  URINE CULTURE     PROCEDURES Procedures    INITIAL IMPRESSION / ASSESSMENT AND PLAN / ED COURSE  Pertinent labs & imaging results that were available during my care of the patient were reviewed by me and considered in my medical decision making (see chart for details).  Well-appearing patient.  Presenting for evaluation of dysuria.  Of note patient has had recurrent UTIs with multiple antibiotic allergies.  Patient previously treated with outpatient Macrobid with full resolution of symptoms and sensitivity on cultures.  Reviewed most recent urological note stating recommended Cipro moving forward for urinary infections.  Last antibiotic use was macrobid, will treat with 7-day course of oral Cipro.  Diflucan sent.  Encourage rest, fluids,  supportive care, follow-up with her urologist.Discussed indication, risks and benefits of medications with patient including labeling with Cipro.  Discussed follow up with Primary care physician this week. Discussed follow up and return parameters including no resolution or any worsening concerns. Patient verbalized understanding and agreed to plan.   ____________________________________________   FINAL CLINICAL IMPRESSION(S) / ED DIAGNOSES  Final diagnoses:  Urinary tract infection with hematuria, site unspecified  Acute vaginitis     ED Discharge Orders         Ordered    ciprofloxacin (CIPRO) 500 MG tablet  Every 12 hours     06/02/19 1046    fluconazole (DIFLUCAN) 150 MG tablet  Daily     06/02/19 1046           Note: This dictation was prepared with Dragon dictation along with smaller phrase technology. Any transcriptional errors that result from this process are unintentional.         Marylene Land, NP 06/02/19 1105

## 2019-06-05 ENCOUNTER — Telehealth: Payer: Self-pay | Admitting: Urology

## 2019-06-05 ENCOUNTER — Telehealth: Payer: Self-pay | Admitting: Emergency Medicine

## 2019-06-05 LAB — URINE CULTURE: Culture: >=100000 — AB

## 2019-06-05 NOTE — Telephone Encounter (Signed)
Patient called today and now wants to know if she should start taking to flomax? She went to the urgent care because she has a UTI and they gave her cipro. Now she is thinking she should start taking the flomax? She had decided not to start it when she last saw you because she stopped having symptoms? Now because of the UTI she is rethinking this. Please advise    Sharyn Lull

## 2019-06-05 NOTE — Telephone Encounter (Signed)
Patient notified and appointment has been scheduled 

## 2019-06-05 NOTE — Telephone Encounter (Signed)
That's fine to start the Flomax, but I would like to see her in 2 weeks for PVR and office visit

## 2019-06-05 NOTE — Telephone Encounter (Signed)
Urine culture was positive for  ESCHERICHIA COLI  and was given cipro  at urgent care visit. Pt contacted and made aware, educated on completing antibiotic and to follow up if symptoms are persistent. Verbalized understanding.

## 2019-06-13 ENCOUNTER — Other Ambulatory Visit: Payer: Self-pay | Admitting: Family Medicine

## 2019-06-13 DIAGNOSIS — K589 Irritable bowel syndrome without diarrhea: Secondary | ICD-10-CM

## 2019-06-16 ENCOUNTER — Encounter: Payer: Self-pay | Admitting: Emergency Medicine

## 2019-06-16 ENCOUNTER — Ambulatory Visit
Admission: EM | Admit: 2019-06-16 | Discharge: 2019-06-16 | Disposition: A | Discharge status: Home / Self Care | Payer: Medicare Other | Attending: Family Medicine | Admitting: Family Medicine

## 2019-06-16 ENCOUNTER — Telehealth: Payer: Self-pay | Admitting: Urology

## 2019-06-16 ENCOUNTER — Other Ambulatory Visit: Payer: Self-pay

## 2019-06-16 DIAGNOSIS — N952 Postmenopausal atrophic vaginitis: Secondary | ICD-10-CM

## 2019-06-16 DIAGNOSIS — R350 Frequency of micturition: Secondary | ICD-10-CM

## 2019-06-16 DIAGNOSIS — R319 Hematuria, unspecified: Secondary | ICD-10-CM | POA: Insufficient documentation

## 2019-06-16 LAB — URINALYSIS, COMPLETE (UACMP) WITH MICROSCOPIC
Bilirubin Urine: NEGATIVE
Glucose, UA: NEGATIVE mg/dL
Ketones, ur: NEGATIVE mg/dL
Leukocytes,Ua: NEGATIVE
Nitrite: NEGATIVE
Protein, ur: NEGATIVE mg/dL
Specific Gravity, Urine: 1.02 (ref 1.005–1.030)
WBC, UA: NONE SEEN WBC/hpf (ref 0–5)
pH: 5.5 (ref 5.0–8.0)

## 2019-06-16 LAB — WET PREP, GENITAL
Clue Cells Wet Prep HPF POC: NONE SEEN
Sperm: NONE SEEN
Trich, Wet Prep: NONE SEEN
WBC, Wet Prep HPF POC: NONE SEEN
Yeast Wet Prep HPF POC: NONE SEEN

## 2019-06-16 MED ORDER — FLUCONAZOLE 150 MG PO TABS: ORAL_TABLET | ORAL | 0 refills | Status: DC

## 2019-06-16 MED ORDER — NITROFURANTOIN MONOHYD MACRO 100 MG PO CAPS: 100.0000 mg | ORAL_CAPSULE | Freq: Two times a day (BID) | ORAL | 0 refills | Status: AC

## 2019-06-16 NOTE — Discharge Instructions (Addendum)
URINARY SYMPTOMS: Your urinalysis shows only trace blood. There is no clear sign of infection, but we will send the urine for culture at this time. For now, rest, increase fluids and take Tylenol for discomfort. If you begin to have pain or worsening symptoms,  begin the antibiotic I have sent to the pharmacy for you. I will also send diflucan in case you need it if you begin the antibiotics. Urine has been sent for culture. We will call if treatment needs to be started or amended. Keep follow up appointment with urologist. Return or go to ER for fever, back pain, worsening urinary pain, discharge, increased blood in urine.

## 2019-06-16 NOTE — ED Triage Notes (Addendum)
Pt c/o urinary urgency, urinary frequency. Started yesterday. She was seen on 06/02/19 for a UTI. She took all the antibiotics and felt better. She made an appt with her urologist but can not get in until December 3rd. She is unsure if her symptoms are from Flomax or from a UTI. She also states that she has vaginal itching and burning. She took the diflucan when she was here before but did not resolve.

## 2019-06-16 NOTE — Telephone Encounter (Signed)
Unable to reach patient no voice mail

## 2019-06-16 NOTE — Telephone Encounter (Signed)
Pt called and would like a call back to discuss side effects of Flomax.

## 2019-06-16 NOTE — ED Provider Notes (Signed)
[facility]    CSN: [id number] Arrival date & time: 06/16/19  0830      History   Chief Complaint Chief Complaint  Patient presents with  . Urinary Urgency    HPI Carrie Roberson is a 73 y.o. female who presents with complaints of urinary frequency, urgency and bladder pressure x 2 days. She says she is taking Flomax for bladder obstruction off and on as prescribed by her urologist and is unsure if she has another UTI or the symptoms are normal when taking Flomax. She denies pain with urination. Denies fever, fatigue, chills/sweats, weakness, back pain, abdominal pain, abnormal vaginal bleeding, hematuria. Patient has a follow up with her urologist on 06/25/19, but says she wants to be checked for another possible UTI at this time. Last UTI was 2 weeks ago. She took the full course of Cipro for 7 days. She says symptoms did resolve. She took Macrobid for the UTI prior to that. She has had 2-3 UTIs in the past 6 months. Patient also report vaginal irritation and itching. She says she is very prone to yeast infections and she started to have these symptoms a few days after finishing the Cipro medication despite taking Diflucan twice while on the medication. Denies vaginal discharge or pain. She has no other concerns/complaints today.  HPI  Past Medical History:  Diagnosis Date  . History of ischemic colitis 09/02/2017  . Hx of colonic polyp   . Hypertension   . IBS (irritable bowel syndrome)   . Ovarian cyst   . Vaginal atrophy     Patient Active Problem List   Diagnosis Date Noted  . Anxiety 09/02/2017  . 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 FH: diabetes mellitus 09/02/2017  . 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 Atypical nevus 09/02/2017  . IBS (irritable bowel syndrome) 09/02/2017  . Atrophic vaginitis 09/02/2017  . Chronic idiopathic urticaria 09/02/2017  . GERD (gastroesophageal reflux disease) 09/02/2017    Past  Surgical History:  Procedure Laterality Date  . BREAST BIOPSY Left    needle bx/clip-neg  . CERVICAL BIOPSY  W/ LOOP ELECTRODE EXCISION    . CHOLECYSTECTOMY    . Colonoscopoy  2007, 2008, 2011  . COLONOSCOPY WITH PROPOFOL N/A 05/05/2018   Procedure: COLONOSCOPY WITH PROPOFOL;  Surgeon: Lin Landsman, MD;  Location: [facility] ENDOSCOPY;  Service: Gastroenterology;  Laterality: N/A;  . LEEP    . TUBAL LIGATION  1999    OB History    Gravida  0   Para  0   Term  0   Preterm  0   AB  0   Living  0     SAB  0   TAB  0   Ectopic  0   Multiple  0   Live Births  0            Home Medications    Prior to Admission medications   Medication Sig Start Date End Date Taking? Authorizing Provider  acetaminophen (TYLENOL) 500 MG tablet Take 1,000 mg by mouth every 8 (eight) hours as needed.   Yes [provider]  amitriptyline (ELAVIL) 25 MG tablet TAKE 1 TABLET BY MOUTH EVERYDAY AT BEDTIME 06/15/19  Yes Juline Patch, MD  cetirizine (ZYRTEC) 10 MG tablet Take 10 mg by mouth at bedtime.   Yes [provider]  fluticasone (FLONASE) 50 MCG/ACT nasal spray Place 2 sprays into both nostrils daily. 06/30/18  Yes Lynwood Dawley,  Malachy Chamber, PA-C  omeprazole (PRILOSEC) 40 MG capsule Take 40 mg by mouth daily as needed.   Yes [provider]  tamsulosin (FLOMAX) 0.4 MG CAPS capsule Take 1 capsule (0.4 mg total) by mouth daily. 05/08/19  Yes McGowan, Larene Beach A, PA-C  fluconazole (DIFLUCAN) 150 MG tablet Take 1 tab PO q72 hr as needed for yeast infection 06/16/19   Danton Clap, PA-C  nitrofurantoin, macrocrystal-monohydrate, (MACROBID) 100 MG capsule Take 1 capsule (100 mg total) by mouth 2 (two) times daily for 5 days. 06/16/19 06/21/19  Danton Clap, PA-C    Family History Family History  Problem Relation Age of Onset  . Heart failure Mother   . Atrial fibrillation Mother   . Hypertension Mother   . Diabetes Father   . Heart attack Father   . Breast  cancer Maternal Aunt 70  . Breast cancer Maternal Grandmother 80  . Breast cancer Cousin        2 mat cousins  . Hypertension Sister   . Hypertension Sister   . Arthritis Sister   . Prostate cancer Neg Hx   . Kidney cancer Neg Hx     Social History Social History   Tobacco Use  . Smoking status: Never Smoker  . Smokeless tobacco: Never Used  . Tobacco comment: smoking cessation materials not required  Substance Use Topics  . Alcohol use: Yes    Frequency: Never    Comment: rarely  . Drug use: No     Allergies   Citalopram, Ivp dye [iodinated diagnostic agents], Meloxicam, Amlodipine, Ampicillin, Cefoxitin, Estradiol, Estropipate, Olmesartan medoxomil-hctz, Sertraline, Sulfa antibiotics, and Sulfamethoxazole   Review of Systems Review of Systems  Constitutional: Negative for fatigue and fever.  Gastrointestinal: Negative for abdominal pain, nausea and vomiting.  Endocrine: Positive for polyuria.  Genitourinary: Positive for frequency and urgency. Negative for difficulty urinating, dysuria, flank pain, hematuria, pelvic pain, vaginal discharge and vaginal pain.  Musculoskeletal: Negative for back pain.  Skin: Negative for rash.  Neurological: Negative for weakness.     Physical Exam Triage Vital Signs ED Triage Vitals  Enc Vitals Group     BP 06/16/19 0851 (!) 165/95     Pulse Rate 06/16/19 0851 99     Resp 06/16/19 0851 18     Temp 06/16/19 0851 98 F (36.7 C)     Temp Source 06/16/19 0851 Oral     SpO2 06/16/19 0851 97 %     Weight 06/16/19 0849 190 lb (86.2 kg)     Height 06/16/19 0849 5\' 3"  (1.6 m)     Head Circumference --      Peak Flow --      Pain Score 06/16/19 0849 0     Pain Loc --      Pain Edu? --      Excl. in [place]? --    No data found.  Updated Vital Signs BP (!) 165/95 (BP Location: Left Arm)   Pulse 99   Temp 98 F (36.7 C) (Oral)   Resp 18   Ht 5\' 3"  (1.6 m)   Wt 190 lb (86.2 kg)   SpO2 97%   BMI 33.66 kg/m       Physical Exam  Vitals signs and nursing note reviewed.  Constitutional:      General: She is not in acute distress.    Appearance: She is normal weight. She is not ill-appearing or toxic-appearing.  HENT:     Head: Normocephalic and atraumatic.  Eyes:     General: No scleral icterus.       Right eye: No discharge.        Left eye: No discharge.     Conjunctiva/sclera: Conjunctivae normal.  Cardiovascular:     Rate and Rhythm: Normal rate and regular rhythm.     Heart sounds: Normal heart sounds. No murmur.  Pulmonary:     Effort: Pulmonary effort is normal. No respiratory distress.     Breath sounds: Normal breath sounds.  Abdominal:     Palpations: Abdomen is soft.     Tenderness: There is no abdominal tenderness. There is no right CVA tenderness, left CVA tenderness, guarding or rebound.  Skin:    General: Skin is warm and dry.     Findings: No rash.  Neurological:     General: No focal deficit present.     Mental Status: She is alert. Mental status is at baseline.     Motor: No weakness.     Gait: Gait normal.  Psychiatric:        Mood and Affect: Mood normal.        Behavior: Behavior normal.        Thought Content: Thought content normal.      UC Treatments / Results  Labs (all labs ordered are listed, but only abnormal results are displayed) Labs Reviewed  URINALYSIS, COMPLETE (UACMP) WITH MICROSCOPIC - Abnormal; Notable for the following components:      Result Value   Hgb urine dipstick TRACE (*)    Bacteria, UA FEW (*)    All other components within normal limits  WET PREP, GENITAL  URINE CULTURE    EKG   Radiology No results found.  Procedures Procedures (including critical care time)  Medications Ordered in UC Medications - No data to display  Initial Impression / Assessment and Plan / UC Course  I have reviewed the triage vital signs and the nursing notes.  Pertinent labs & imaging results that were available during my care of the patient were reviewed by  me and considered in my medical decision making (see chart for details).   Discussed results of urinalysis with patient. No clear sign of UTI, but given history I have sent Macrobid for her in case she develops pain over holidays or culture is positive. Patient reports she cannot take Bactrim DS or PCN type drugs. Advised her to keep follow up with urologist. Vaginal itching likely due to her underlying atrophic vaginitis. Follow up with gynecologist about this if needed.   Final Clinical Impressions(s) / UC Diagnoses   Final diagnoses:  Urinary frequency  Hematuria, unspecified type  Atrophic vaginitis     Discharge Instructions     URINARY SYMPTOMS: Your urinalysis shows only trace blood. There is no clear sign of infection, but we will send the urine for culture at this time. For now, rest, increase fluids and take Tylenol for discomfort. If you begin to have pain or worsening symptoms,  begin the antibiotic I have sent to the pharmacy for you. I will also send diflucan in case you need it if you begin the antibiotics. Urine has been sent for culture. We will call if treatment needs to be started or amended. Keep follow up appointment with urologist. Return or go to ER for fever, back pain, worsening urinary pain, discharge, increased blood in urine.       ED Prescriptions    Medication Sig Dispense Auth. Provider   nitrofurantoin, macrocrystal-monohydrate, (MACROBID) 100  MG capsule Take 1 capsule (100 mg total) by mouth 2 (two) times daily for 5 days. 10 capsule Laurene Footman B, PA-C   fluconazole (DIFLUCAN) 150 MG tablet Take 1 tab PO q72 hr as needed for yeast infection 3 tablet Danton Clap, PA-C     PDMP not reviewed this encounter.   Danton Clap, PA-C 06/16/19 [phone]

## 2019-06-18 LAB — URINE CULTURE: Culture: 40000 — AB

## 2019-06-22 ENCOUNTER — Telehealth (HOSPITAL_COMMUNITY): Payer: Self-pay | Admitting: Emergency Medicine

## 2019-06-22 NOTE — Telephone Encounter (Signed)
2nd attempt unable to leave message, no voicemail set up.

## 2019-06-22 NOTE — Telephone Encounter (Signed)
Urine culture was positive for STAPHYLOCOCCUS EPIDERMIDIS and was given amcrobid  at urgent care visit. Pt contacted and made aware, educated on completing antibiotic and to follow up if symptoms are persistent. Verbalized understanding. She has follow up with urology this week.

## 2019-06-24 NOTE — Progress Notes (Signed)
06/25/19 4:59 PM   [name] [birth year] [id number]  Referring provider: Juline Patch, MD [address] [place],  [place] [address]  CC: rUTIs  HPI: Mrs. Rambert is a 73 year old female with rUTI's who presents today for complaints of urinary pressure and pain.    Background history She is a 73 year old female with past medical history notable for IBS who has had 3 UTIs over the last 3 years.  She denies any UTIs prior to this.  She is not sexually active.  Her symptoms of UTI are typically dysuria, urgency, frequency, and pelvic pain.  Her most recent UTI was E. coli on 04/21/2019 which was treated with culture appropriate nitrofurantoin.  She has a number of allergies to antibiotics.  She has known vaginal atrophy, however has an allergy to topical estrogen.  She denies any gross hematuria, pneumaturia, or urinary incontinence.  She denies any flank pain or history of nephrolithiasis.  She denies any current constipation, but is having some problems with diarrhea from her IBS at this time.  There are no aggravating or alleviating factors.  Severity is mild.  At her last visit, she is experiencing frequency, urgency and getting up at night to urinate.  She has increased pelvic pressure.  She does not have incontinence.  Patient denies any gross hematuria, dysuria or suprapubic/flank pain.  Patient denies any fevers, chills, nausea or vomiting.  Her UA noted many bacteria, but otherwise benign.  Her PVR was 219.    Today, she states she voiding well all night with a good flow.  She has not urinated much today.  Patient denies any gross hematuria, dysuria or suprapubic/flank pain.  Patient denies any fevers, chills, nausea or vomiting.    PVR is 428 mL     PMH: Past Medical History:  Diagnosis Date  . History of ischemic colitis 09/02/2017  . Hx of colonic polyp   . Hypertension   . IBS (irritable bowel syndrome)   . Ovarian cyst   . Vaginal atrophy     Surgical History:  Past Surgical History:  Procedure Laterality Date  . BREAST BIOPSY Left    needle bx/clip-neg  . CERVICAL BIOPSY  W/ LOOP ELECTRODE EXCISION    . CHOLECYSTECTOMY    . Colonoscopoy  2007, 2008, 2011  . COLONOSCOPY WITH PROPOFOL N/A 05/05/2018   Procedure: COLONOSCOPY WITH PROPOFOL;  Surgeon: Lin Landsman, MD;  Location: [facility] ENDOSCOPY;  Service: Gastroenterology;  Laterality: N/A;  . LEEP    . TUBAL LIGATION  1999    Allergies:  Allergies  Allergen Reactions  . Citalopram Nausea And Vomiting  . Ivp Dye [Iodinated Diagnostic Agents] Swelling    hives  . Meloxicam     unknown  . Amlodipine Rash  . Ampicillin Rash  . Cefoxitin Rash  . Estradiol Rash  . Estropipate Rash  . Olmesartan Medoxomil-Hctz Rash  . Sertraline Rash       . Sulfa Antibiotics Rash  . Sulfamethoxazole Rash    Family History: Family History  Problem Relation Age of Onset  . Heart failure Mother   . Atrial fibrillation Mother   . Hypertension Mother   . Diabetes Father   . Heart attack Father   . Breast cancer Maternal Aunt 70  . Breast cancer Maternal Grandmother 80  . Breast cancer Cousin        2 mat cousins  . Hypertension Sister   . Hypertension Sister   . Arthritis Sister   .  Prostate cancer Neg Hx   . Kidney cancer Neg Hx     Social History:  reports that she has never smoked. She has never used smokeless tobacco. She reports current alcohol use. She reports that she does not use drugs.  ROS: Please see flowsheet from today's date for complete review of systems.  Physical Exam: BP (!) 148/83   Pulse (!) 105   Ht 5\' 3"  (1.6 m)   Wt 190 lb (86.2 kg)   BMI 33.66 kg/m   Constitutional:  Well nourished. Alert and oriented, No acute distress. HEENT: [place] AT, moist mucus membranes.  Trachea midline, no masses. Cardiovascular: No clubbing, cyanosis, or edema. Respiratory: Normal respiratory effort, no increased work of breathing. Neurologic: Grossly intact, no focal deficits,  moving all 4 extremities. Psychiatric: Normal mood and affect.  Laboratory Data: E Coli UTIs 04/21/19, 06/08/18  Pertinent Imaging: Results for HANORA, SCRUTON (MRN [id number]) as of 06/25/2019 16:53  Ref. Range 06/25/2019 15:31  Scan Result Unknown 428    Simple Catheter Placement Due to urinary retention patient is present today for a foley cath placement.  Patient was cleaned and prepped in a sterile fashion with betadine and lidocaine jelly 2% was instilled into the urethra.  A 16 FR foley catheter was inserted, urine return was noted   30 ml, urine was yellow in color.  The catheter was irrigated easily.  The balloon was drained of the 10cc of sterile water and catheter removed.   Performed by: Zara Council, PA-C and Elberta Leatherwood, CMA  Assessment & Plan:    1. ? Ascites Patient will return in the morning so that we can order a STAT abdominal/pelvic US for further evaluation as the bladder scanners in our office noted a reading of 428 mL and placing a Foley noted no residual    Zara Council, PA-C  [place] [address] [place], [place] [address] [phone]

## 2019-06-25 ENCOUNTER — Encounter: Payer: Self-pay | Admitting: Urology

## 2019-06-25 ENCOUNTER — Other Ambulatory Visit: Payer: Self-pay

## 2019-06-25 ENCOUNTER — Ambulatory Visit (INDEPENDENT_AMBULATORY_CARE_PROVIDER_SITE_OTHER): Payer: Medicare Other | Admitting: Urology

## 2019-06-25 VITALS — BP 148/83 | HR 105 | Ht 63.0 in | Wt 190.0 lb

## 2019-06-25 DIAGNOSIS — R339 Retention of urine, unspecified: Secondary | ICD-10-CM | POA: Diagnosis not present

## 2019-06-25 LAB — BLADDER SCAN AMB NON-IMAGING: Scan Result: 428

## 2019-06-25 NOTE — Telephone Encounter (Signed)
Called 3 times to discuss Flomax. Unable to leave message no voicemail set up.

## 2019-06-26 ENCOUNTER — Other Ambulatory Visit: Payer: Self-pay

## 2019-06-26 ENCOUNTER — Other Ambulatory Visit: Payer: Self-pay | Admitting: Urology

## 2019-06-26 ENCOUNTER — Ambulatory Visit (INDEPENDENT_AMBULATORY_CARE_PROVIDER_SITE_OTHER): Payer: Medicare Other | Admitting: Urology

## 2019-06-26 ENCOUNTER — Encounter: Payer: Self-pay | Admitting: Urology

## 2019-06-26 ENCOUNTER — Ambulatory Visit
Admission: RE | Admit: 2019-06-26 | Discharge: 2019-06-26 | Disposition: A | Discharge status: Home / Self Care | Payer: Medicare Other | Source: Ambulatory Visit | Attending: Urology | Admitting: Urology

## 2019-06-26 VITALS — BP 125/85 | HR 99 | Ht 63.0 in | Wt 190.0 lb

## 2019-06-26 DIAGNOSIS — R188 Other ascites: Secondary | ICD-10-CM

## 2019-06-26 DIAGNOSIS — N83209 Unspecified ovarian cyst, unspecified side: Secondary | ICD-10-CM | POA: Diagnosis not present

## 2019-06-26 NOTE — Progress Notes (Signed)
06/26/19 12:05 PM   [name] 1946/01/18 [id number]  Referring provider: Juline Patch, MD [address] [place],  [place] [address]  CC: rUTIs  HPI: [name] is a 73 year old female with rUTI's who presents today for complaints of urinary pressure and pain.    Background history She is a 73 year old female with past medical history notable for IBS who has had 3 UTIs over the last 3 years.  She denies any UTIs prior to this.  She is not sexually active.  Her symptoms of UTI are typically dysuria, urgency, frequency, and pelvic pain.  Her most recent UTI was E. coli on 04/21/2019 which was treated with culture appropriate nitrofurantoin.  She has a number of allergies to antibiotics.  She has known vaginal atrophy, however has an allergy to topical estrogen.  She denies any gross hematuria, pneumaturia, or urinary incontinence.  She denies any flank pain or history of nephrolithiasis.  She denies any current constipation, but is having some problems with diarrhea from her IBS at this time.  There are no aggravating or alleviating factors.  Severity is mild.  At her last visit, she is experiencing frequency, urgency and getting up at night to urinate.  She has increased pelvic pressure.  She does not have incontinence.  Patient denies any gross hematuria, dysuria or suprapubic/flank pain.  Patient denies any fevers, chills, nausea or vomiting.  Her UA noted many bacteria, but otherwise benign.  Her PVR was 219.    Yesterday, she states she voiding well all night with a good flow.  She has not urinated much today.  Patient denies any gross hematuria, dysuria or suprapubic/flank pain.  Patient denies any fevers, chills, nausea or vomiting.    PVR is 428 mL.  Today, she states that she has been voiding well at night.  She does has a little irritation and soreness which is likely due to the catheterization yesterday.   In office ultrasound notes a large fluid collection.  She is  straight cathed with minimal return of urine.   PMH: Past Medical History:  Diagnosis Date  . History of ischemic colitis 09/02/2017  . Hx of colonic polyp   . Hypertension   . IBS (irritable bowel syndrome)   . Ovarian cyst   . Vaginal atrophy     Surgical History: Past Surgical History:  Procedure Laterality Date  . BREAST BIOPSY Left    needle bx/clip-neg  . CERVICAL BIOPSY  W/ LOOP ELECTRODE EXCISION    . CHOLECYSTECTOMY    . Colonoscopoy  2007, 2008, 2011  . COLONOSCOPY WITH PROPOFOL N/A 05/05/2018   Procedure: COLONOSCOPY WITH PROPOFOL;  Surgeon: Lin Landsman, MD;  [facility] ENDOSCOPY;  Service: Gastroenterology;  Laterality: N/A;  . LEEP    . TUBAL LIGATION  1999    Allergies:  Allergies  Allergen Reactions  . Citalopram Nausea And Vomiting  . Ivp Dye [Iodinated Diagnostic Agents] Swelling    hives  . Meloxicam     unknown  . Amlodipine Rash  . Ampicillin Rash  . Cefoxitin Rash  . Estradiol Rash  . Estropipate Rash  . Olmesartan Medoxomil-Hctz Rash  . Sertraline Rash       . Sulfa Antibiotics Rash  . Sulfamethoxazole Rash    Family History: Family History  Problem Relation Age of Onset  . Heart failure Mother   . Atrial fibrillation Mother   . Hypertension Mother   . Diabetes Father   . Heart  attack Father   . Breast cancer Maternal Aunt 70  . Breast cancer Maternal Grandmother 80  . Breast cancer Cousin        2 mat cousins  . Hypertension Sister   . Hypertension Sister   . Arthritis Sister   . Prostate cancer Neg Hx   . Kidney cancer Neg Hx     Social History:  reports that she has never smoked. She has never used smokeless tobacco. She reports current alcohol use. She reports that she does not use drugs.  ROS: Please see flowsheet from today's date for complete review of systems.  Physical Exam: BP 125/85   Pulse 99   Ht 5\' 3"  (1.6 m)   Wt 190 lb (86.2 kg)   BMI 33.66 kg/m   Constitutional:  Well nourished. Alert and  oriented, No acute distress. HEENT: [place] AT, moist mucus membranes.  Trachea midline, no masses. Cardiovascular: No clubbing, cyanosis, or edema. Respiratory: Normal respiratory effort, no increased work of breathing. GI: Abdomen is soft, non tender, non distended, no abdominal masses. Liver and spleen not palpable.  No hernias appreciated.  Stool sample for occult testing is not indicated.   GU: No CVA tenderness.  No bladder fullness or masses.  Atrophic external genitalia, Neurologic: Grossly intact, no focal deficits, moving all 4 extremities. Psychiatric: Normal mood and affect.   Laboratory Data: E Coli UTIs 04/21/19, 06/08/18  Pertinent Imaging: CLINICAL DATA:  Ascites.  EXAM: LIMITED ABDOMEN ULTRASOUND FOR ASCITES  TECHNIQUE: Limited ultrasound survey for ascites was performed in all four abdominal quadrants.  COMPARISON:  Office pelvic ultrasounds dated 06/06/2018 and 11/18/2017.  FINDINGS: No ascites is demonstrated. There is a cystic midline pelvic mass which lies superior to the bladder, measuring 10.2 x 9.2 x 6.3 cm.  IMPRESSION: 1. No ascites. 2. Large cystic mass in the pelvis, not demonstrated on previous ultrasounds. This is suspicious for ovarian cystic neoplasm. Further evaluation recommended with dedicated pelvic ultrasound and/or abdominopelvic CT. 3. These results will be called to the ordering clinician or representative by the Radiologist Assistant, and communication documented in the PACS or zVision Dashboard.   Electronically Signed   By: Richardean Sale M.D.   On: 06/26/2019 13:43          In and Out Catheterization Patient is present today for a I & O catheterization due to rule out retention. Patient was cleaned and prepped in a sterile fashion with betadine and Lidocaine 2% jelly was instilled into the urethra.  A 69 FR female straight cath was inserted no complications were noted , 30 ml of urine return was noted, urine was yellow  in color.   Performed by: Zara Council, PA-C   Assessment & Plan:    1. ? Ascites STAT abdominal ultrasound noted a large cystic mass in the pelvis concerning for an ovarian neoplasm She will be seeing Dr. Georgianne Fick on Monday, 06/29/2019 for further evaluation of this finding   Zara Council, [place] [address] [place], [place] [address] [phone]

## 2019-06-29 ENCOUNTER — Other Ambulatory Visit: Payer: Self-pay

## 2019-06-29 ENCOUNTER — Encounter: Payer: Self-pay | Admitting: Obstetrics and Gynecology

## 2019-06-29 ENCOUNTER — Ambulatory Visit (INDEPENDENT_AMBULATORY_CARE_PROVIDER_SITE_OTHER): Payer: Medicare Other | Admitting: Obstetrics and Gynecology

## 2019-06-29 VITALS — BP 180/100 | HR 106 | Ht 63.0 in | Wt 189.0 lb

## 2019-06-29 DIAGNOSIS — R1900 Intra-abdominal and pelvic swelling, mass and lump, unspecified site: Secondary | ICD-10-CM

## 2019-06-29 DIAGNOSIS — N949 Unspecified condition associated with female genital organs and menstrual cycle: Secondary | ICD-10-CM | POA: Diagnosis not present

## 2019-06-29 NOTE — Progress Notes (Signed)
Obstetrics & Gynecology Office Visit   Chief Complaint:  Chief Complaint  Patient presents with  . Follow-up    Right ovarian cyst    History of Present Illness: The patient is a 73 y.o. female presenting for consultation at the request of Zara Council, PA at [place] concerning a recently imaged midline pelvic mass.  Initial presentation was prompted by frequent urination and bladder pessure. PVR was checked and noted to be normal but bladder scan showed residual fluid collection.  TVUS was ordered and revealed a 10.2 x 9.2 x 6.3 cm midline cystic pelvic mass. Symptoms initially started in August of 2020. She had previously been followed asymptomatic 1.42 x 1.58cm simple left ovarian cyst on 11/18/2017.    Appearance on 06/26/2019 ultrasound was notable simple cyst, normal doppler flow and no free fluid. The patient endorses associated symptoms of pelvic pressure.  The patient denies associated symptoms of  early satiety, weight gain, weight loss, night sweats, vaginal bleeding and nausea.  There is not a notable family history of ovarian cancer, uterine cancer, breast cancer, or colon cancer.  Review of Systems: 10 point review of systems negative unless otherwise noted in HPI  Past Medical History:  Past Medical History:  Diagnosis Date  . History of ischemic colitis 09/02/2017  . Hx of colonic polyp   . Hypertension   . IBS (irritable bowel syndrome)   . Ovarian cyst   . Vaginal atrophy     Past Surgical History:  Past Surgical History:  Procedure Laterality Date  . BREAST BIOPSY Left    needle bx/clip-neg  . CERVICAL BIOPSY  W/ LOOP ELECTRODE EXCISION    . CHOLECYSTECTOMY    . Colonoscopoy  2007, 2008, 2011  . COLONOSCOPY WITH PROPOFOL N/A 05/05/2018   Procedure: COLONOSCOPY WITH PROPOFOL;  Surgeon: Lin Landsman, MD;  Location: [facility] ENDOSCOPY;  Service: Gastroenterology;  Laterality: N/A;  . LEEP    . TUBAL LIGATION  1999    Gynecologic History:  No LMP recorded. Patient is postmenopausal.  Obstetric History: G0P0000  Family History:  Family History  Problem Relation Age of Onset  . Heart failure Mother   . Atrial fibrillation Mother   . Hypertension Mother   . Diabetes Father   . Heart attack Father   . Breast cancer Maternal Aunt 70  . Breast cancer Maternal Grandmother 80  . Breast cancer Cousin        2 mat cousins  . Hypertension Sister   . Hypertension Sister   . Arthritis Sister   . Prostate cancer Neg Hx   . Kidney cancer Neg Hx     Social History:  Social History   Socioeconomic History  . Marital status: Single    Spouse name: Not on file  . Number of children: 0  . Years of education: Not on file  . Highest education level: Associate degree: academic program  Occupational History  . Occupation: Retired  Scientific laboratory technician  . Financial resource strain: Not hard at all  . Food insecurity    Worry: Never true    Inability: Never true  . Transportation needs    Medical: No    Non-medical: No  Tobacco Use  . Smoking status: Never Smoker  . Smokeless tobacco: Never Used  . Tobacco comment: smoking cessation materials not required  Substance and Sexual Activity  . Alcohol use: Yes    Frequency: Never    Comment: rarely  . Drug use: No  . Sexual  activity: Never    Birth control/protection: Post-menopausal  Lifestyle  . Physical activity    Days per week: 0 days    Minutes per session: 0 min  . Stress: Not at all  Relationships  . Social Herbalist on phone: Patient refused    Gets together: Patient refused    Attends religious service: Patient refused    Active member of club or organization: Patient refused    Attends meetings of clubs or organizations: Patient refused    Relationship status: Patient refused  . Intimate partner violence    Fear of current or ex partner: No    Emotionally abused: No    Physically abused: No    Forced sexual activity: No  Other Topics Concern  .  Not on file  Social History Narrative  . Not on file    Allergies:  Allergies  Allergen Reactions  . Citalopram Nausea And Vomiting  . Ivp Dye [Iodinated Diagnostic Agents] Swelling    hives  . Meloxicam     unknown  . Amlodipine Rash  . Ampicillin Rash  . Cefoxitin Rash  . Estradiol Rash  . Estropipate Rash  . Olmesartan Medoxomil-Hctz Rash  . Sertraline Rash       . Sulfa Antibiotics Rash  . Sulfamethoxazole Rash    Medications: Prior to Admission medications   Medication Sig Start Date End Date Taking? Authorizing Provider  acetaminophen (TYLENOL) 500 MG tablet Take 1,000 mg by mouth every 8 (eight) hours as needed.   Yes [provider]  amitriptyline (ELAVIL) 25 MG tablet TAKE 1 TABLET BY MOUTH EVERYDAY AT BEDTIME 06/15/19  Yes Juline Patch, MD  cetirizine (ZYRTEC) 10 MG tablet Take 10 mg by mouth at bedtime.   Yes [provider]  fluconazole (DIFLUCAN) 150 MG tablet Take 1 tab PO q72 hr as needed for yeast infection 06/16/19  Yes Laurene Footman B, PA-C  fluticasone (FLONASE) 50 MCG/ACT nasal spray Place 2 sprays into both nostrils daily. 06/30/18  Yes Lorin Picket, PA-C  omeprazole (PRILOSEC) 40 MG capsule Take 40 mg by mouth daily as needed.   Yes [provider]  tamsulosin (FLOMAX) 0.4 MG CAPS capsule Take 1 capsule (0.4 mg total) by mouth daily. 05/08/19  Yes McGowan, Hunt Oris, PA-C    Physical Exam Vitals:  Vitals:   06/29/19 1318  BP: (!) 180/100  Pulse: (!) 106   No LMP recorded. Patient is postmenopausal.  General: NAD HEENT: normocephalic, anicteric Thyroid: no enlargement, no palpable nodules Pulmonary: No increased work of breathing Cardiovascular: RRR, distal pulses 2+ Abdomen: NABS, soft, non-tender, non-distended.  Umbilicus without lesions.  No hepatomegaly, splenomegaly or masses palpable. No evidence of hernia  Genitourinary:  External: Normal external female genitalia.  Normal urethral meatus, normal  Bartholin's and Skene's glands.    Vagina: Normal vaginal mucosa, no evidence of prolapse.    Cervix: Grossly normal in appearance, no bleeding  Uterus: Non-enlarged, mobile, normal contour.  No CMT  Adnexa: ovaries non-enlarged, no adnexal masses  Rectal: deferred  Lymphatic: no evidence of inguinal lymphadenopathy Extremities: no edema, erythema, or tenderness Neurologic: Grossly intact Psychiatric: mood appropriate, affect full  Female chaperone present for pelvic and breast  portions of the physical exam  US Abdomen Limited  Result Date: 06/26/2019 CLINICAL DATA:  Ascites. EXAM: LIMITED ABDOMEN ULTRASOUND FOR ASCITES TECHNIQUE: Limited ultrasound survey for ascites was performed in all four abdominal quadrants. COMPARISON:  Office pelvic ultrasounds dated 06/06/2018 and 11/18/2017. FINDINGS:  No ascites is demonstrated. There is a cystic midline pelvic mass which lies superior to the bladder, measuring 10.2 x 9.2 x 6.3 cm. IMPRESSION: 1. No ascites. 2. Large cystic mass in the pelvis, not demonstrated on previous ultrasounds. This is suspicious for ovarian cystic neoplasm. Further evaluation recommended with dedicated pelvic ultrasound and/or abdominopelvic CT. 3. These results will be called to the ordering clinician or representative by the Radiologist Assistant, and communication documented in the PACS or zVision Dashboard. Electronically Signed   By: Richardean Sale M.D.   On: 06/26/2019 13:43    Images reviewed independently: simple cyst measuring 9.22cm in greatest dimension, no excrescences, septations, or solid components visualized.  Normal doppler flow  Assessment: 73 y.o. G0P0000 presenting for pelvic cyst  Plan: Problem List Items Addressed This Visit    None    Visit Diagnoses    Pelvic mass    -  Primary   Relevant Orders   MR PELVIS W WO CONTRAST   Ovarian Malignancy Risk-ROMA   Unspecified condition associated with female genital organs and menstrual cycle        Relevant Orders   MR ABDOMEN WWO CONTRAST   MR PELVIS W WO CONTRAST   Ovarian Malignancy Risk-ROMA      1)  The incidence and implication of adnexal masses and ovarian cysts were discussed with the patient in detail.  Prior imaging if available was reviewed at today's visit.  While adnexal masses and cysts are a less common imaging finding in postmenopausal women as compared to premenopausal women, the vast majority of these lesions are still benign.  Follow up imaging to determine stability in size and appearance is reasonable in order to provide additional reassurance.  In some cases symptoms, family history, or indeterminate or concerning findings may warrant surgical evaluation and referral to a Gynecology-Oncologist., or serum tumor markers.    2) ROMA tumor markers were ordered  3) IOTA LR2 score showing a LR of 6.2%  4) Favor benign ovarian neoplasm, but given symptoms and size would recommend removal.  Will obtain MRI abdomen and pelvis to further characterize.    5) A total of 15 minutes were spent in face-to-face contact with the patient during this encounter with over half of that time devoted to counseling and coordination of care. Prior imaging independently reviewed  6)  No follow-ups on file.   [name], MD, [facility], [facility] 06/29/2019, 2:31 PM

## 2019-06-30 ENCOUNTER — Ambulatory Visit: Payer: Medicare Other | Admitting: Urology

## 2019-06-30 LAB — OVARIAN MALIGNANCY RISK-ROMA
Cancer Antigen (CA) 125: 10.3 U/mL (ref 0.0–38.1)
HE4: 68.8 pmol/L (ref 0.0–96.9)
Postmenopausal ROMA: 1.21
Premenopausal ROMA: 1.44 — ABNORMAL HIGH

## 2019-06-30 LAB — POSTMENOPAUSAL INTERP: LOW

## 2019-06-30 LAB — PREMENOPAUSAL INTERP: HIGH

## 2019-07-01 ENCOUNTER — Other Ambulatory Visit: Payer: Self-pay | Admitting: Obstetrics and Gynecology

## 2019-07-01 ENCOUNTER — Ambulatory Visit
Admission: RE | Admit: 2019-07-01 | Discharge: 2019-07-01 | Disposition: A | Discharge status: Home / Self Care | Payer: Medicare Other | Source: Ambulatory Visit | Attending: Obstetrics and Gynecology | Admitting: Obstetrics and Gynecology

## 2019-07-01 ENCOUNTER — Other Ambulatory Visit: Payer: Self-pay

## 2019-07-01 DIAGNOSIS — R1900 Intra-abdominal and pelvic swelling, mass and lump, unspecified site: Secondary | ICD-10-CM

## 2019-07-01 DIAGNOSIS — N949 Unspecified condition associated with female genital organs and menstrual cycle: Secondary | ICD-10-CM

## 2019-07-01 DIAGNOSIS — N838 Other noninflammatory disorders of ovary, fallopian tube and broad ligament: Secondary | ICD-10-CM

## 2019-07-01 MED ORDER — GADOBUTROL 1 MMOL/ML IV SOLN
9.0000 mL | Freq: Once | INTRAVENOUS | Status: AC | PRN
  Administered 2019-07-01: 9 mL via INTRAVENOUS

## 2019-07-08 ENCOUNTER — Ambulatory Visit: Payer: Medicare Other | Admitting: Obstetrics and Gynecology

## 2019-07-13 ENCOUNTER — Ambulatory Visit: Payer: Medicare Other

## 2019-07-13 ENCOUNTER — Other Ambulatory Visit: Payer: Medicare Other

## 2019-07-14 ENCOUNTER — Other Ambulatory Visit: Payer: Self-pay

## 2019-07-14 NOTE — Progress Notes (Signed)
Gynecologic Oncology Consult Visit   Referring Provider:  Dr. [name]   Chief Concern: cystic pelvic mass  Subjective:  Carrie Roberson is a 73 y.o. G0P0 female who is seen in consultation from Dr. Georgianne Fick for symptomatic cystic pelvic mass.  [place] concerning a recently imaged midline pelvic mass.  Initial presentation was prompted by frequent urination and bladder pessure. PVR was checked and noted to be normal but bladder scan showed residual fluid collection.  TVUS was ordered and revealed a 10.2 x 9.2 x 6.3 cm midline cystic pelvic mass. Symptoms initially started in August of 2020. She had previously been followed asymptomatic 1.42 x 1.58cm simple left ovarian cyst on 11/18/2017.    Appearance on 06/26/2019 ultrasound was notable simple cyst, normal doppler flow and no free fluid. The patient endorses associated symptoms of pelvic pressure.  The patient denies associated symptoms of  early satiety, weight gain, weight loss, night sweats, vaginal bleeding and nausea.  There is not a notable family history of ovarian cancer, uterine cancer, breast cancer, or colon cancer.   06/07/2018 Pelvic US Findings:  The uterus is anteverted and measures 6.8x3x2.7cm. Echo texture is homogenous without evidence of focal masses. The Endometrium measures 4.19 mm.  Right Ovary measures 4.7 cm3. It is normal in appearance. Left Ovary measures 5.2 cm3. It has a hypoechoic cyst = 1.4x1.85mm Survey of the adnexa demonstrates no adnexal masses. There is no free fluid in the cul de sac.  Impression: 1. Left Ovary measures 5.2 cm3. It has a hypoechoic cyst = 1.4x1.71mm (possibility of hemorrhagic cyst vs others) 2. Cervical canal appears irregular d/t LEEP procedure   06/26/2019 Abdominal US  FINDINGS: No ascites is demonstrated. There is a cystic midline pelvic mass which lies superior to the bladder, measuring 10.2 x 9.2 x 6.3 cm.  IMPRESSION: 1. No ascites. 2. Large cystic  mass in the pelvis, not demonstrated on previous ultrasounds. This is suspicious for ovarian cystic neoplasm. Further evaluation recommended with dedicated pelvic ultrasound and/or abdominopelvic CT.  07/01/2019 MRI Reproductive:  Uterus: Measures 4.3 x 2.0 by 5.9 cm. Scattered nabothian cysts identified. No mass.  Right ovary measures the 10.5 x 6.8 by 9.0 cm (volume = 340 cm^3). There is a large cystic mass arising from the right ovary which has a maximum dimension of 10.8 cm. An eccentric, enhancing mural nodule is identified measuring 2.6 x 1.2 cm, image 50/10. No internal septation.  Left ovary measures 2.9 x 2.1 by 1.8 cm. (Volume = 5.7 cm^3). This contains several small cyst which measure up to 1.4 cm.  06/29/2019 labs  Cancer Antigen (CA) 125 0.0 - 38.1 U/mL 10.3   Comment: [employer] Electrochemiluminescence Immunoassay (ECLIA)  Values obtained with different assay methods or kits cannot be  used interchangeably. Results cannot be interpreted as absolute  evidence of the presence or absence of malignant disease.   HE4 0.0 - 96.9 pmol/L 68.8   Comment: [employer] Electrochemiluminescence Immunoassay (ECLIA)  Values obtained with different assay methods or kits cannot be  used interchangeably. Results cannot be interpreted as absolute  evidence of the presence or absence of malignant disease.       Postmenopausal ROMA See below 1.21    ROMA calculator 12%  Of note prior LEEP 2002 (negative pathology per patient) last pap 07/26/2014 NILM.   She presents today for evaluation.   Problem List: Patient Active Problem List   Diagnosis Date Noted  . Cyst of ovary 07/15/2019  . Anxiety 09/02/2017  .  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 FH: diabetes mellitus 09/02/2017  . 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 Atypical nevus 09/02/2017  . IBS (irritable bowel syndrome) 09/02/2017  . Atrophic vaginitis  09/02/2017  . Chronic idiopathic urticaria 09/02/2017  . GERD (gastroesophageal reflux disease) 09/02/2017    Past Medical History:  Past Medical History:  Diagnosis Date  . Arthritis   . History of ischemic colitis 09/02/2017  . Hx of colonic polyp   . Hypertension   . IBS (irritable bowel syndrome)   . Ovarian cyst   . Vaginal atrophy     Past Surgical History: Past Surgical History:  Procedure Laterality Date  . BREAST BIOPSY Left    needle bx/clip-neg  . CERVICAL BIOPSY  W/ LOOP ELECTRODE EXCISION    . CHOLECYSTECTOMY    . Colonoscopoy  2007, 2008, 2011  . COLONOSCOPY WITH PROPOFOL N/A 05/05/2018   Procedure: COLONOSCOPY WITH PROPOFOL;  Surgeon: Lin Landsman, MD;  Location: [facility] ENDOSCOPY;  Service: Gastroenterology;  Laterality: N/A;  . LEEP    . TUBAL LIGATION  1999    Past Gynecologic History:  Menarche: 9 Last Menstrual Period: menopausal History of Abnormal pap: No Contraception: bilateral tubal ligation  OB History:  OB History  Gravida Para Term Preterm AB Living  0 0 0 0 0 0  SAB TAB Ectopic Multiple Live Births  0 0 0 0 0    Family History: Family History  Problem Relation Age of Onset  . Heart failure Mother   . Atrial fibrillation Mother   . Hypertension Mother   . Diabetes Father   . Heart attack Father   . Breast cancer Maternal Aunt 70  . Breast cancer Maternal Grandmother 80  . Breast cancer Cousin        2 mat cousins  . Hypertension Sister   . Hypertension Sister   . Arthritis Sister   . Leukemia Paternal Aunt   . Leukemia Paternal Uncle   . Brain cancer Paternal Aunt   . Prostate cancer Neg Hx   . Kidney cancer Neg Hx     Social History: Retired  Social History   Socioeconomic History  . Marital status: Single    Spouse name: Not on file  . Number of children: 0  . Years of education: Not on file  . Highest education level: Associate degree: academic program  Occupational History  . Occupation: Retired  Tobacco  Use  . Smoking status: Never Smoker  . Smokeless tobacco: Never Used  . Tobacco comment: smoking cessation materials not required  Substance and Sexual Activity  . Alcohol use: Yes    Comment: rarely  . Drug use: No  . Sexual activity: Never    Birth control/protection: Post-menopausal  Other Topics Concern  . Not on file  Social History Narrative  . Not on file   Social Determinants of Health   Financial Resource Strain:   . Difficulty of Paying Living Expenses: Not on file  Food Insecurity:   . Worried About Charity fundraiser in the Last Year: Not on file  . Ran Out of Food in the Last Year: Not on file  Transportation Needs:   . Lack of Transportation (Medical): Not on file  . Lack of Transportation (Non-Medical): Not on file  Physical Activity:   . Days of Exercise per Week: Not on file  . Minutes of Exercise per Session: Not on file  Stress:   .  Feeling of Stress : Not on file  Social Connections:   . Frequency of Communication with Friends and Family: Not on file  . Frequency of Social Gatherings with Friends and Family: Not on file  . Attends Religious Services: Not on file  . Active Member of Clubs or Organizations: Not on file  . Attends Archivist Meetings: Not on file  . Marital Status: Not on file  Intimate Partner Violence:   . Fear of Current or Ex-Partner: Not on file  . Emotionally Abused: Not on file  . Physically Abused: Not on file  . Sexually Abused: Not on file    Allergies: Allergies  Allergen Reactions  . Citalopram Nausea And Vomiting  . Ivp Dye [Iodinated Diagnostic Agents] Swelling    hives  . Meloxicam     unknown  . Amlodipine Rash  . Ampicillin Rash  . Cefoxitin Rash  . Estradiol Rash  . Estropipate Rash  . Olmesartan Medoxomil-Hctz Rash  . Sertraline Rash       . Sulfa Antibiotics Rash  . Sulfamethoxazole Rash    Current Medications: Current Outpatient Medications  Medication Sig Dispense Refill  .  acetaminophen (TYLENOL) 500 MG tablet Take 1,000 mg by mouth every 8 (eight) hours as needed.    Marland Kitchen amitriptyline (ELAVIL) 25 MG tablet TAKE 1 TABLET BY MOUTH EVERYDAY AT BEDTIME 90 tablet 0  . cetirizine (ZYRTEC) 10 MG tablet Take 10 mg by mouth at bedtime.    . fluticasone (FLONASE) 50 MCG/ACT nasal spray Place 2 sprays into both nostrils daily. 16 g 0  . omeprazole (PRILOSEC) 40 MG capsule Take 40 mg by mouth daily as needed.    . fluconazole (DIFLUCAN) 150 MG tablet Take 1 tab PO q72 hr as needed for yeast infection (Patient not taking: Reported on 07/15/2019) 3 tablet 0   No current facility-administered medications for this visit.    Review of Systems General: negative for fevers, changes in weight or night sweats Skin: negative for changes in moles or sores or rash Eyes: negative for changes in vision HEENT: negative for change in hearing, tinnitus, voice changes Pulmonary: negative for dyspnea, orthopnea, productive cough, wheezing Cardiac: negative for palpitations, pain Gastrointestinal: alternatingconstipation, diarrhea due to IBS; negative for nausea, vomiting, hematemesis, hematochezia Genitourinary/Sexual: urinary frequency; negative for dysuria, retention, hematuria, incontinence Ob/Gyn:  Pelvic pressure but negative for abnormal bleeding, or pain Musculoskeletal: negative for pain, joint pain, back pain Hematology: negative for easy bruising, abnormal bleeding Neurologic/Psych: negative for headaches, seizures, paralysis, weakness, numbness  Objective:  Physical Examination:  BP (!) 154/99 (BP Location: Right Arm, Patient Position: Sitting)   Pulse 76   Temp 98.1 F (36.7 C) (Tympanic)   Resp 16   Ht 5\' 3"  (1.6 m)   Wt 188 lb 8 oz (85.5 kg)   BMI 33.39 kg/m    Repeat BP 154/99  ECOG Performance Status: 1 - Symptomatic but completely ambulatory  General appearance: alert, cooperative and appears stated age HEENT:PERRLA, extra ocular movement intact and sclera  clear, anicteric Lymph node survey: non-palpable, axillary, inguinal, supraclavicular Cardiovascular: regular rate and rhythm Respiratory: normal air entry, lungs clear to auscultation Abdomen: soft, non-tender, without masses or organomegaly, distended, no hepatosplenomegaly, no hernias and well healed incision. No ascites Back: inspection of back is normal Extremities: extremities normal, atraumatic, no cyanosis or edema Skin exam - normal coloration and turgor, no rashes, no suspicious skin lesions noted. Neurological exam reveals alert, oriented, normal speech, no focal findings or movement disorder  noted.  Pelvic: exam chaperoned by nurse;  Vulva: normal appearing vulva with no masses, tenderness or lesions; Vagina: normal vagina other than atrophy; narrow caliber; Adnexa:unable to exam due to patient discomfort; Uterus: unable to determine size but nontender; Cervix: no lesions; Rectal: not indicated and normal rectal, no masses    Lab Review Labs on site today: n/a  Radiologic Imaging: MRI/ultrasound images personally reviewed     Assessment:  Carrie Roberson is a 73 y.o. female diagnosed with symptomatic complex adnexal mass with normal tumor markers and low risk ROMA score. Differential diagnosis still includes malignancy (50% early stage ovarian cancers may have normal CA125, but normal tumor markers and low risk ROMA are reassuring), benign ovarian cyst, or LMP.   History of LEEP with subsequent normal Pap.   HTN - asymptomatic  Medical co-morbidities complicating care: HTN, obesity and prior abdominal surgery (BTL and cholecystectomy). Body mass index is 33.39 kg/m.   Plan:   Problem List Items Addressed This Visit      Endocrine   Cyst of ovary - Primary      We discussed options for management and based on symptoms and size/appearance of ovarian cyst I recommend definitive surgical evaluation. She can have her surgery under the direction of Dr. Georgianne Fick. I defer to  Dr. Georgianne Fick the extent of the surgery but I recommended at least BSO. I did discuss minimally invasive approach versus laparotomy and need to drainage of the cyst in order for removal. Drainage could result in spillage of cancer cells if malignancy and then chemotherapy would be needed If intraoperative pathology is consistent with malignancy even if she has early stage disease. After this discussion she wished to have the most conservative approach performed which would be laparotomy and understands that the recovery time will be increased.  I have consented her for staging with peritoneal biopsies, omentectomy, and pelvic/para-aortic node dissection.   Risks for surgical staging were discussed in detail. These include infection, anesthesia, bleeding, transfusion, wound separation, vaginal cuff dehiscence if she has a hysterectomy, medical issues (blood clots, stroke, heart attack, fluid in the lungs, pneumonia, abnormal heart rhythm, death), possible exploratory surgery with larger incision, lymphedema, lymphocyst, allergic reaction, injury to adjacent organs (bowel, bladder, blood vessels, nerves, ureters, uterus).   We contacted Dr. Danielle Rankin office and left a message for them to contact us so we can share our recommendations and they can follow up with the patient.   HTN precautions given. She has no symptoms of high blood pressure and I provided information regarding symptoms and signs to watch for such as headache, blurry vision, chest pain/pressure, pain radiating to left arm and s/s of stroke. If she has these symptoms to contact 911. Per her report she often has high BP at physicians' offices so this may be "white coat" hypertension. She understands the concerns.   Suggested return to clinic in  2-3 weeks if she has malignancy.    The patient's diagnosis, an outline of the further diagnostic and laboratory studies which will be required, the recommendation, and alternatives were discussed.  All  questions were answered to the patient's satisfaction.  A total of 80 minutes were spent with the patient/family today; >50% was spent in education, counseling and coordination of care for symptomatic complex adnexal mass.   Dari Carpenito Gaetana Michaelis, MD    CC:  [name], MD [address] [place],  [place] [address] [phone]

## 2019-07-15 ENCOUNTER — Encounter: Payer: Self-pay | Admitting: Obstetrics and Gynecology

## 2019-07-15 ENCOUNTER — Other Ambulatory Visit: Payer: Self-pay

## 2019-07-15 ENCOUNTER — Inpatient Hospital Stay: Payer: Medicare Other | Attending: Obstetrics and Gynecology | Admitting: Obstetrics and Gynecology

## 2019-07-15 VITALS — BP 154/99 | HR 76 | Temp 98.1°F | Resp 16 | Ht 63.0 in | Wt 188.5 lb

## 2019-07-15 DIAGNOSIS — F419 Anxiety disorder, unspecified: Secondary | ICD-10-CM | POA: Insufficient documentation

## 2019-07-15 DIAGNOSIS — E669 Obesity, unspecified: Secondary | ICD-10-CM | POA: Insufficient documentation

## 2019-07-15 DIAGNOSIS — K219 Gastro-esophageal reflux disease without esophagitis: Secondary | ICD-10-CM | POA: Insufficient documentation

## 2019-07-15 DIAGNOSIS — N83209 Unspecified ovarian cyst, unspecified side: Secondary | ICD-10-CM | POA: Insufficient documentation

## 2019-07-15 DIAGNOSIS — Z9049 Acquired absence of other specified parts of digestive tract: Secondary | ICD-10-CM | POA: Insufficient documentation

## 2019-07-15 DIAGNOSIS — M5134 Other intervertebral disc degeneration, thoracic region: Secondary | ICD-10-CM | POA: Insufficient documentation

## 2019-07-15 DIAGNOSIS — K589 Irritable bowel syndrome without diarrhea: Secondary | ICD-10-CM | POA: Insufficient documentation

## 2019-07-15 DIAGNOSIS — Z6833 Body mass index (BMI) 33.0-33.9, adult: Secondary | ICD-10-CM | POA: Insufficient documentation

## 2019-07-15 DIAGNOSIS — I10 Essential (primary) hypertension: Secondary | ICD-10-CM | POA: Insufficient documentation

## 2019-07-15 DIAGNOSIS — Z833 Family history of diabetes mellitus: Secondary | ICD-10-CM | POA: Insufficient documentation

## 2019-07-15 NOTE — Progress Notes (Signed)
Pt anxious, she has had previous UTI and then through time it was deicided that she  Has ovarian mass and that is why she is here today.

## 2019-07-16 ENCOUNTER — Telehealth: Payer: Self-pay

## 2019-07-16 NOTE — Telephone Encounter (Signed)
I have spoken with Carrie Roberson at Dr. Danielle Rankin office. He will return on Monday. Copy of consent sent to HIM for scanning. They will contact Ms. [place] regarding surgery date. I have called and updated Ms. [place] regarding plan.

## 2019-07-22 ENCOUNTER — Other Ambulatory Visit: Payer: Self-pay | Admitting: Obstetrics and Gynecology

## 2019-07-22 DIAGNOSIS — N83201 Unspecified ovarian cyst, right side: Secondary | ICD-10-CM

## 2019-07-24 DIAGNOSIS — Z9221 Personal history of antineoplastic chemotherapy: Secondary | ICD-10-CM

## 2019-07-24 HISTORY — DX: Personal history of antineoplastic chemotherapy: Z92.21

## 2019-07-30 ENCOUNTER — Other Ambulatory Visit: Payer: Self-pay | Admitting: Urology

## 2019-07-30 DIAGNOSIS — R339 Retention of urine, unspecified: Secondary | ICD-10-CM

## 2019-08-03 ENCOUNTER — Other Ambulatory Visit: Payer: Self-pay

## 2019-08-03 ENCOUNTER — Ambulatory Visit (INDEPENDENT_AMBULATORY_CARE_PROVIDER_SITE_OTHER): Payer: Medicare Other

## 2019-08-03 ENCOUNTER — Ambulatory Visit (INDEPENDENT_AMBULATORY_CARE_PROVIDER_SITE_OTHER): Payer: Medicare Other | Admitting: Obstetrics and Gynecology

## 2019-08-03 ENCOUNTER — Other Ambulatory Visit: Payer: Self-pay | Admitting: Obstetrics and Gynecology

## 2019-08-03 ENCOUNTER — Encounter: Payer: Self-pay | Admitting: Obstetrics and Gynecology

## 2019-08-03 VITALS — BP 178/100 | HR 99 | Wt 191.0 lb

## 2019-08-03 DIAGNOSIS — N83201 Unspecified ovarian cyst, right side: Secondary | ICD-10-CM | POA: Diagnosis not present

## 2019-08-03 DIAGNOSIS — N83202 Unspecified ovarian cyst, left side: Secondary | ICD-10-CM | POA: Diagnosis not present

## 2019-08-03 NOTE — Progress Notes (Signed)
Gynecology Ultrasound Follow Up  Chief Complaint:  Chief Complaint  Patient presents with  . Follow-up    GYN Ultrasound     History of Present Illness: Patient is a 74 y.o. female who presents today for ultrasound evaluation of right ovarian cyst.  Ultrasound demonstrates the following findgins Adnexa: Stable 10cm complex right ovarian cyst Uterus: Non-enlarged  Additional: no free fluid  Review of Systems: Review of Systems  Constitutional: Negative.   Gastrointestinal: Negative.   Genitourinary: Positive for frequency. Negative for dysuria and urgency.    Past Medical History:  Past Medical History:  Diagnosis Date  . Arthritis   . History of ischemic colitis 09/02/2017  . Hx of colonic polyp   . Hypertension   . IBS (irritable bowel syndrome)   . Ovarian cyst   . Vaginal atrophy     Past Surgical History:  Past Surgical History:  Procedure Laterality Date  . BREAST BIOPSY Left    needle bx/clip-neg  . CERVICAL BIOPSY  W/ LOOP ELECTRODE EXCISION    . CHOLECYSTECTOMY    . Colonoscopoy  2007, 2008, 2011  . COLONOSCOPY WITH PROPOFOL N/A 05/05/2018   Procedure: COLONOSCOPY WITH PROPOFOL;  Surgeon: Lin Landsman, MD;  Location: [facility] ENDOSCOPY;  Service: Gastroenterology;  Laterality: N/A;  . LEEP    . TUBAL LIGATION  1999    Gynecologic History:  No LMP recorded. Patient is postmenopausal.  Family History:  Family History  Problem Relation Age of Onset  . Heart failure Mother   . Atrial fibrillation Mother   . Hypertension Mother   . Diabetes Father   . Heart attack Father   . Breast cancer Maternal Aunt 70  . Breast cancer Maternal Grandmother 80  . Breast cancer Cousin        2 mat cousins  . Hypertension Sister   . Hypertension Sister   . Arthritis Sister   . Leukemia Paternal Aunt   . Leukemia Paternal Uncle   . Brain cancer Paternal Aunt   . Prostate cancer Neg Hx   . Kidney cancer Neg Hx     Social History:  Social History    Socioeconomic History  . Marital status: Single    Spouse name: Not on file  . Number of children: 0  . Years of education: Not on file  . Highest education level: Associate degree: academic program  Occupational History  . Occupation: Retired  Tobacco Use  . Smoking status: Never Smoker  . Smokeless tobacco: Never Used  . Tobacco comment: smoking cessation materials not required  Substance and Sexual Activity  . Alcohol use: Yes    Comment: rarely  . Drug use: No  . Sexual activity: Never    Birth control/protection: Post-menopausal  Other Topics Concern  . Not on file  Social History Narrative  . Not on file   Social Determinants of Health   Financial Resource Strain:   . Difficulty of Paying Living Expenses: Not on file  Food Insecurity:   . Worried About Charity fundraiser in the Last Year: Not on file  . Ran Out of Food in the Last Year: Not on file  Transportation Needs:   . Lack of Transportation (Medical): Not on file  . Lack of Transportation (Non-Medical): Not on file  Physical Activity:   . Days of Exercise per Week: Not on file  . Minutes of Exercise per Session: Not on file  Stress:   . Feeling of Stress : Not  on file  Social Connections:   . Frequency of Communication with Friends and Family: Not on file  . Frequency of Social Gatherings with Friends and Family: Not on file  . Attends Religious Services: Not on file  . Active Member of Clubs or Organizations: Not on file  . Attends Archivist Meetings: Not on file  . Marital Status: Not on file  Intimate Partner Violence:   . Fear of Current or Ex-Partner: Not on file  . Emotionally Abused: Not on file  . Physically Abused: Not on file  . Sexually Abused: Not on file    Allergies:  Allergies  Allergen Reactions  . Citalopram Nausea And Vomiting  . Ivp Dye [Iodinated Diagnostic Agents] Swelling    hives  . Meloxicam     unknown  . Amlodipine Rash  . Ampicillin Rash  . Cefoxitin  Rash  . Estradiol Rash  . Estropipate Rash  . Olmesartan Medoxomil-Hctz Rash  . Sertraline Rash       . Sulfa Antibiotics Rash  . Sulfamethoxazole Rash    Medications: Prior to Admission medications   Medication Sig Start Date End Date Taking? Authorizing Provider  acetaminophen (TYLENOL) 500 MG tablet Take 1,000 mg by mouth every 8 (eight) hours as needed.   Yes [provider]  amitriptyline (ELAVIL) 25 MG tablet TAKE 1 TABLET BY MOUTH EVERYDAY AT BEDTIME 06/15/19  Yes Juline Patch, MD  cetirizine (ZYRTEC) 10 MG tablet Take 10 mg by mouth at bedtime.   Yes [provider]  fluconazole (DIFLUCAN) 150 MG tablet Take 1 tab PO q72 hr as needed for yeast infection 06/16/19  Yes Laurene Footman B, PA-C  fluticasone (FLONASE) 50 MCG/ACT nasal spray Place 2 sprays into both nostrils daily. 06/30/18  Yes Lorin Picket, PA-C  omeprazole (PRILOSEC) 40 MG capsule Take 40 mg by mouth daily as needed.   Yes [provider]    Physical Exam Vitals: Blood pressure (!) 178/100, pulse 99, weight 191 lb (86.6 kg).  General: NAD HEENT: normocephalic, anicteric Pulmonary: No increased work of breathing Extremities: no edema, erythema, or tenderness Neurologic: Grossly intact, normal gait Psychiatric: mood appropriate, affect full   Assessment: 74 y.o. G0P0000 follow up right ovarian cyst Plan: Problem List Items Addressed This Visit    None    Visit Diagnoses    Right ovarian cyst    -  Primary      1) Stable size and appearance on ultrasound, will plan on combined case with gyn onc  20 - 27th of January.  Given negative tumor markers reassured, as well as stability.  However, malignancy can not be entirely ruled out given size and complexity.  Patient has met with gyn/onc and desires laparotomy for removal to minimize risk of spill should this be an actual early malignancy.  Will try and get approval for inpatient stay. - consents signed today for BSO via  laparotomy with possible TAH and staging  2) A total of 15 minutes were spent in face-to-face contact with the patient during this encounter with over half of that time devoted to counseling and coordination of care.  3) Return if symptoms worsen or fail to improve.    [name], MD, [facility], [facility] 08/03/2019, 9:13 AM

## 2019-08-14 ENCOUNTER — Encounter
Admission: RE | Admit: 2019-08-14 | Discharge: 2019-08-14 | Disposition: A | Discharge status: Home / Self Care | Payer: Medicare Other | Source: Ambulatory Visit | Attending: Obstetrics and Gynecology | Admitting: Obstetrics and Gynecology

## 2019-08-14 ENCOUNTER — Telehealth: Payer: Self-pay | Admitting: Obstetrics and Gynecology

## 2019-08-14 DIAGNOSIS — Z01818 Encounter for other preprocedural examination: Secondary | ICD-10-CM | POA: Insufficient documentation

## 2019-08-14 HISTORY — DX: Gastro-esophageal reflux disease without esophagitis: K21.9

## 2019-08-14 HISTORY — DX: Anxiety disorder, unspecified: F41.9

## 2019-08-14 NOTE — Patient Instructions (Signed)
Your procedure is scheduled on: 08-19-19 [facility] Report to Same Day Surgery 2nd floor medical mall [facility] Entrance-take elevator on left to 2nd floor.  Check in with surgery information desk.) To find out your arrival time please call [phone] between 1PM - 3PM on 08-18-19 TUESDAY  Remember: Instructions that are not followed completely may result in serious medical risk, up to and including death, or upon the discretion of your surgeon and anesthesiologist your surgery may need to be rescheduled.    _x___ 1. Do not eat food after midnight the night before your procedure. NO GUM OR CANDY AFTER MIDNIGHT. You may drink clear liquids up to 2 hours before you are scheduled to arrive at the hospital for your procedure.  Do not drink clear liquids within 2 hours of your scheduled arrival to the hospital.  Clear liquids include  --Water or Apple juice without pulp  --Gatorade  --Black Coffee or Clear Tea (No milk, no creamers, do not add anything to the coffee or Tea   ____Ensure clear carbohydrate drink on the way to the hospital for bariatric patients  ____Ensure clear carbohydrate drink 3 hours before surgery.    __x__ 2. No Alcohol for 24 hours before or after surgery.   __x__3. No Smoking or e-cigarettes for 24 prior to surgery.  Do not use any chewable tobacco products for at least 6 hour prior to surgery   ____  4. Bring all medications with you on the day of surgery if instructed.    __x__ 5. Notify your doctor if there is any change in your medical condition     (cold, fever, infections).    x___6. On the morning of surgery brush your teeth with toothpaste and water.  You may rinse your mouth with mouth wash if you wish.  Do not swallow any toothpaste or mouthwash.   Do not wear jewelry, make-up, hairpins, clips or nail polish.  Do not wear lotions, powders, or perfumes  Do not shave 48 hours prior to surgery. Men may shave face and neck.  Do not bring valuables to  the hospital.    [facility] is not responsible for any belongings or valuables.               Contacts, dentures or bridgework may not be worn into surgery.  Leave your suitcase in the car. After surgery it may be brought to your room.  For patients admitted to the hospital, discharge time is determined by your  treatment team.  _  Patients discharged the day of surgery will not be allowed to drive home.  You will need someone to drive you home and stay with you the night of your procedure.    Please read over the following fact sheets that you were given:   [facility] Preparing for Surgery   _x___ TAKE THE FOLLOWING MEDICATION THE MORNING OF YOUR SURGERY WITH SMALL SIP OF WATER. These include:  1. PRILOSEC (OMEPRAZOLE)  2. TAKE A PRILOSEC THE NIGHT BEFORE YOUR SURGERY  3.  4.  5.  6.  ____Fleets enema or Magnesium Citrate as directed.   _x___ Use CHG Soap or sage wipes as directed on instruction sheet   ____ Use inhalers on the day of surgery and bring to hospital day of surgery  ____ Stop Metformin and Janumet 2 days prior to surgery.    ____ Take 1/2 of usual insulin dose the night before surgery and none on the morning surgery.   ____  Follow recommendations from Cardiologist, Pulmonologist or PCP regarding stopping Aspirin, Coumadin, Plavix ,Eliquis, Effient, or Pradaxa, and Pletal.  X____Stop Anti-inflammatories such as Advil, Aleve, Ibuprofen, Motrin, Naproxen, Naprosyn, Goodies powders or aspirin products NOW-OK to take Tylenol    ____ Stop supplements until after surgery   ____ Bring C-Pap to the hospital.

## 2019-08-14 NOTE — Telephone Encounter (Signed)
Patient is aware of 08/19/19 OR date w/ Dr. Georgianne Fick and Gyn Onc. Patient is aware of Pre-admit Testing phone interview this morning, and COVID testing Monday morning. Directions were given. Patient said her sister will drive her to appt. Patient has received both notifications on MyChart. Patient has already received a call from the [facility] and is aware she may receive a call from the [facility]. Patient said her understanding is the procedure would not be laparoscopic, and to let her know if that has changed.

## 2019-08-14 NOTE — Telephone Encounter (Signed)
-----   Message from [name], MD sent at 08/03/2019  8:49 AM EST ----- Regarding: Surgery Surgery Date: 08/10/2018 or 08/18/2018  LOS: surgery admit  Surgery Booking Request Patient Full Name: Carrie Roberson MRN: [id number]  DOB: 1945/08/11  Surgeon: [name], MD  Requested Surgery Date and Time: 08/10/2018 or 08/18/2018 Combined case gyn oncology Primary Diagnosis and Code: Right ovarian cyst N83.201 Secondary Diagnosis and Code:  Surgical Procedure: Bilateral salpingo-oophorectomy for complex right ovarian cyst, possible hysterectomy and staging L&D Notification:no Admission Status: surgery admit Length of Surgery: 2hrs Special Case Needs: none H&P: Done today (date) Phone Interview or Office Pre-Admit: pre-admit Interpreter: No Language: English Medical Clearance: No Special Scheduling Instructions: Combined case gyn onc  Diabetes, sleep apnea, latex allergy, defibrillator/pacemaker?: No Acuity: P2   (P1 highest, P2 delay may cause harm, P3 low, elective gyn, P4 lowest) At least some concern for possible ovarian malignancy

## 2019-08-17 ENCOUNTER — Other Ambulatory Visit: Payer: Medicare Other

## 2019-08-17 ENCOUNTER — Ambulatory Visit
Admission: RE | Admit: 2019-08-17 | Discharge: 2019-08-17 | Disposition: A | Discharge status: Home / Self Care | Payer: Medicare Other | Source: Ambulatory Visit | Attending: Obstetrics and Gynecology | Admitting: Obstetrics and Gynecology

## 2019-08-17 ENCOUNTER — Other Ambulatory Visit: Payer: Self-pay

## 2019-08-17 DIAGNOSIS — Z01810 Encounter for preprocedural cardiovascular examination: Secondary | ICD-10-CM | POA: Diagnosis not present

## 2019-08-17 LAB — CBC
HCT: 42.8 % (ref 36.0–46.0)
Hemoglobin: 13.8 g/dL (ref 12.0–15.0)
MCH: 27.4 pg (ref 26.0–34.0)
MCHC: 32.2 g/dL (ref 30.0–36.0)
MCV: 84.9 fL (ref 80.0–100.0)
Platelets: 295 10*3/uL (ref 150–400)
RBC: 5.04 MIL/uL (ref 3.87–5.11)
RDW: 14.4 % (ref 11.5–15.5)
WBC: 3.5 10*3/uL — ABNORMAL LOW (ref 4.0–10.5)
nRBC: 0 % (ref 0.0–0.2)

## 2019-08-17 LAB — TYPE AND SCREEN
ABO/RH(D): A POS
Antibody Screen: NEGATIVE

## 2019-08-17 LAB — SARS CORONAVIRUS 2 (TAT 6-24 HRS): SARS Coronavirus 2: NEGATIVE

## 2019-08-18 MED ORDER — CLINDAMYCIN PHOSPHATE 900 MG/50ML IV SOLN
900.0000 mg | INTRAVENOUS | Status: AC
  Administered 2019-08-19: 900 mg via INTRAVENOUS

## 2019-08-18 MED ORDER — GENTAMICIN SULFATE 40 MG/ML IJ SOLN
5.0000 mg/kg | INTRAVENOUS | Status: DC
  Filled 2019-08-18: qty 10.75

## 2019-08-19 ENCOUNTER — Inpatient Hospital Stay: Payer: Medicare Other | Admitting: Registered Nurse

## 2019-08-19 ENCOUNTER — Inpatient Hospital Stay
Admission: RE | Admit: 2019-08-19 | Discharge: 2019-08-24 | DRG: 737 | Disposition: A | Discharge status: Home / Self Care | Payer: Medicare Other | Attending: Obstetrics and Gynecology | Admitting: Obstetrics and Gynecology

## 2019-08-19 ENCOUNTER — Encounter: Payer: Self-pay | Admitting: Obstetrics and Gynecology

## 2019-08-19 ENCOUNTER — Encounter
Admission: RE | Disposition: A | Discharge status: Home / Self Care | Payer: Self-pay | Source: Home / Self Care | Attending: Obstetrics and Gynecology

## 2019-08-19 ENCOUNTER — Other Ambulatory Visit: Payer: Self-pay

## 2019-08-19 DIAGNOSIS — N83291 Other ovarian cyst, right side: Secondary | ICD-10-CM | POA: Diagnosis not present

## 2019-08-19 DIAGNOSIS — Z8543 Personal history of malignant neoplasm of ovary: Secondary | ICD-10-CM

## 2019-08-19 DIAGNOSIS — D251 Intramural leiomyoma of uterus: Secondary | ICD-10-CM | POA: Diagnosis not present

## 2019-08-19 DIAGNOSIS — C561 Malignant neoplasm of right ovary: Principal | ICD-10-CM | POA: Diagnosis present

## 2019-08-19 DIAGNOSIS — D62 Acute posthemorrhagic anemia: Secondary | ICD-10-CM | POA: Diagnosis not present

## 2019-08-19 DIAGNOSIS — K5900 Constipation, unspecified: Secondary | ICD-10-CM | POA: Diagnosis not present

## 2019-08-19 DIAGNOSIS — Z90710 Acquired absence of both cervix and uterus: Secondary | ICD-10-CM

## 2019-08-19 DIAGNOSIS — I973 Postprocedural hypertension: Secondary | ICD-10-CM | POA: Diagnosis not present

## 2019-08-19 DIAGNOSIS — N800 Endometriosis of uterus: Secondary | ICD-10-CM | POA: Diagnosis not present

## 2019-08-19 DIAGNOSIS — N838 Other noninflammatory disorders of ovary, fallopian tube and broad ligament: Secondary | ICD-10-CM | POA: Diagnosis present

## 2019-08-19 DIAGNOSIS — Z20822 Contact with and (suspected) exposure to covid-19: Secondary | ICD-10-CM | POA: Diagnosis present

## 2019-08-19 DIAGNOSIS — N83201 Unspecified ovarian cyst, right side: Secondary | ICD-10-CM | POA: Diagnosis not present

## 2019-08-19 DIAGNOSIS — N840 Polyp of corpus uteri: Secondary | ICD-10-CM | POA: Diagnosis not present

## 2019-08-19 HISTORY — PX: OMENTECTOMY: SHX5985

## 2019-08-19 HISTORY — PX: HYSTERECTOMY ABDOMINAL WITH SALPINGO-OOPHORECTOMY: SHX6792

## 2019-08-19 LAB — ABO/RH: ABO/RH(D): A POS

## 2019-08-19 SURGERY — HYSTERECTOMY, ABDOMINAL, WITH SALPINGO-OOPHORECTOMY
Anesthesia: General | Site: Abdomen

## 2019-08-19 MED ORDER — KETOROLAC TROMETHAMINE 30 MG/ML IJ SOLN: 30.0000 mg | Freq: Once | INTRAMUSCULAR | Status: DC

## 2019-08-19 MED ORDER — KETOROLAC TROMETHAMINE 15 MG/ML IJ SOLN
15.0000 mg | Freq: Once | INTRAMUSCULAR | Status: AC
  Administered 2019-08-19: 15 mg via INTRAVENOUS

## 2019-08-19 MED ORDER — METHYLENE BLUE 0.5 % INJ SOLN
INTRAVENOUS | Status: AC
  Filled 2019-08-19: qty 10

## 2019-08-19 MED ORDER — PROPOFOL 10 MG/ML IV BOLUS
INTRAVENOUS | Status: AC
  Filled 2019-08-19: qty 20

## 2019-08-19 MED ORDER — SUGAMMADEX SODIUM 200 MG/2ML IV SOLN
INTRAVENOUS | Status: DC | PRN
  Administered 2019-08-19: 173.2 mg via INTRAVENOUS

## 2019-08-19 MED ORDER — FENTANYL CITRATE (PF) 100 MCG/2ML IJ SOLN
INTRAMUSCULAR | Status: DC | PRN
  Administered 2019-08-19 (×2): 25 ug via INTRAVENOUS
  Administered 2019-08-19 (×2): 50 ug via INTRAVENOUS
  Administered 2019-08-19: 25 ug via INTRAVENOUS

## 2019-08-19 MED ORDER — FENTANYL CITRATE (PF) 100 MCG/2ML IJ SOLN
INTRAMUSCULAR | Status: AC
  Administered 2019-08-19: 25 ug via INTRAVENOUS
  Filled 2019-08-19: qty 2

## 2019-08-19 MED ORDER — PHENYLEPHRINE HCL (PRESSORS) 10 MG/ML IV SOLN
INTRAVENOUS | Status: AC
  Filled 2019-08-19: qty 1

## 2019-08-19 MED ORDER — ROCURONIUM BROMIDE 50 MG/5ML IV SOLN
INTRAVENOUS | Status: AC
  Filled 2019-08-19: qty 1

## 2019-08-19 MED ORDER — FENTANYL CITRATE (PF) 100 MCG/2ML IJ SOLN
25.0000 ug | INTRAMUSCULAR | Status: AC | PRN
  Administered 2019-08-19 (×6): 25 ug via INTRAVENOUS

## 2019-08-19 MED ORDER — ACETAMINOPHEN 10 MG/ML IV SOLN
INTRAVENOUS | Status: AC
  Filled 2019-08-19: qty 100

## 2019-08-19 MED ORDER — SENNOSIDES-DOCUSATE SODIUM 8.6-50 MG PO TABS
1.0000 | ORAL_TABLET | Freq: Every evening | ORAL | Status: DC | PRN
  Administered 2019-08-22: 1 via ORAL
  Filled 2019-08-19: qty 1

## 2019-08-19 MED ORDER — KETOROLAC TROMETHAMINE 30 MG/ML IJ SOLN
INTRAMUSCULAR | Status: AC
  Filled 2019-08-19: qty 1

## 2019-08-19 MED ORDER — LACTATED RINGERS IV SOLN: INTRAVENOUS | Status: DC

## 2019-08-19 MED ORDER — SUGAMMADEX SODIUM 500 MG/5ML IV SOLN: INTRAVENOUS | Status: DC | PRN

## 2019-08-19 MED ORDER — CLINDAMYCIN PHOSPHATE 900 MG/50ML IV SOLN
INTRAVENOUS | Status: AC
  Filled 2019-08-19: qty 50

## 2019-08-19 MED ORDER — MIDAZOLAM HCL 2 MG/2ML IJ SOLN
INTRAMUSCULAR | Status: AC
  Filled 2019-08-19: qty 2

## 2019-08-19 MED ORDER — SIMETHICONE 80 MG PO CHEW
80.0000 mg | CHEWABLE_TABLET | Freq: Four times a day (QID) | ORAL | Status: DC | PRN
  Administered 2019-08-19 – 2019-08-22 (×5): 80 mg via ORAL
  Filled 2019-08-19 (×6): qty 1

## 2019-08-19 MED ORDER — PROPOFOL 10 MG/ML IV BOLUS
INTRAVENOUS | Status: DC | PRN
  Administered 2019-08-19: 150 mg via INTRAVENOUS

## 2019-08-19 MED ORDER — MIDAZOLAM HCL 2 MG/2ML IJ SOLN
INTRAMUSCULAR | Status: DC | PRN
  Administered 2019-08-19 (×2): 1 mg via INTRAVENOUS

## 2019-08-19 MED ORDER — FENTANYL CITRATE (PF) 100 MCG/2ML IJ SOLN
INTRAMUSCULAR | Status: AC
  Filled 2019-08-19: qty 2

## 2019-08-19 MED ORDER — ONDANSETRON HCL 4 MG/2ML IJ SOLN
INTRAMUSCULAR | Status: AC
  Filled 2019-08-19: qty 2

## 2019-08-19 MED ORDER — ONDANSETRON HCL 4 MG/2ML IJ SOLN
INTRAMUSCULAR | Status: DC | PRN
  Administered 2019-08-19: 4 mg via INTRAVENOUS

## 2019-08-19 MED ORDER — ONDANSETRON HCL 4 MG/2ML IJ SOLN
4.0000 mg | Freq: Four times a day (QID) | INTRAMUSCULAR | Status: DC | PRN
  Administered 2019-08-21: 4 mg via INTRAVENOUS
  Filled 2019-08-19: qty 2

## 2019-08-19 MED ORDER — AMITRIPTYLINE HCL 25 MG PO TABS
25.0000 mg | ORAL_TABLET | Freq: Every day | ORAL | Status: DC
  Administered 2019-08-22 – 2019-08-23 (×2): 25 mg via ORAL
  Filled 2019-08-19 (×6): qty 1

## 2019-08-19 MED ORDER — MENTHOL 3 MG MT LOZG
1.0000 | LOZENGE | OROMUCOSAL | Status: DC | PRN
  Filled 2019-08-19: qty 9

## 2019-08-19 MED ORDER — DEXTROSE-NACL 5-0.45 % IV SOLN: INTRAVENOUS | Status: DC

## 2019-08-19 MED ORDER — KETOROLAC TROMETHAMINE 30 MG/ML IJ SOLN
INTRAMUSCULAR | Status: DC | PRN
  Administered 2019-08-19: 15 mg via INTRAVENOUS

## 2019-08-19 MED ORDER — GENTAMICIN SULFATE 40 MG/ML IJ SOLN
INTRAVENOUS | Status: DC | PRN
  Administered 2019-08-19: 430 mg via INTRAVENOUS

## 2019-08-19 MED ORDER — ONDANSETRON HCL 4 MG/2ML IJ SOLN: 4.0000 mg | Freq: Once | INTRAMUSCULAR | Status: DC | PRN

## 2019-08-19 MED ORDER — MORPHINE SULFATE (PF) 2 MG/ML IV SOLN
1.0000 mg | INTRAVENOUS | Status: DC | PRN
  Administered 2019-08-19 – 2019-08-22 (×5): 2 mg via INTRAVENOUS
  Filled 2019-08-19 (×5): qty 1

## 2019-08-19 MED ORDER — ONDANSETRON HCL 4 MG PO TABS: 4.0000 mg | ORAL_TABLET | Freq: Four times a day (QID) | ORAL | Status: DC | PRN

## 2019-08-19 MED ORDER — DEXAMETHASONE SODIUM PHOSPHATE 10 MG/ML IJ SOLN
INTRAMUSCULAR | Status: DC | PRN
  Administered 2019-08-19: 10 mg via INTRAVENOUS

## 2019-08-19 MED ORDER — PANTOPRAZOLE SODIUM 40 MG PO TBEC
40.0000 mg | DELAYED_RELEASE_TABLET | Freq: Every day | ORAL | Status: DC
  Administered 2019-08-20 – 2019-08-24 (×5): 40 mg via ORAL
  Filled 2019-08-19 (×5): qty 1

## 2019-08-19 MED ORDER — ACETAMINOPHEN 10 MG/ML IV SOLN
INTRAVENOUS | Status: DC | PRN
  Administered 2019-08-19: 1000 mg via INTRAVENOUS

## 2019-08-19 MED ORDER — KETOROLAC TROMETHAMINE 15 MG/ML IJ SOLN
INTRAMUSCULAR | Status: AC
  Filled 2019-08-19: qty 1

## 2019-08-19 MED ORDER — DEXAMETHASONE SODIUM PHOSPHATE 10 MG/ML IJ SOLN
INTRAMUSCULAR | Status: AC
  Filled 2019-08-19: qty 1

## 2019-08-19 MED ORDER — SUGAMMADEX SODIUM 200 MG/2ML IV SOLN
INTRAVENOUS | Status: AC
  Filled 2019-08-19: qty 2

## 2019-08-19 MED ORDER — ROCURONIUM BROMIDE 100 MG/10ML IV SOLN
INTRAVENOUS | Status: DC | PRN
  Administered 2019-08-19: 50 mg via INTRAVENOUS
  Administered 2019-08-19: 10 mg via INTRAVENOUS

## 2019-08-19 MED ORDER — LIDOCAINE HCL (PF) 2 % IJ SOLN
INTRAMUSCULAR | Status: AC
  Filled 2019-08-19: qty 5

## 2019-08-19 MED ORDER — OXYCODONE-ACETAMINOPHEN 5-325 MG PO TABS
1.0000 | ORAL_TABLET | ORAL | Status: DC | PRN
  Administered 2019-08-19 – 2019-08-21 (×8): 2 via ORAL
  Administered 2019-08-21 (×2): 1 via ORAL
  Administered 2019-08-21 – 2019-08-22 (×2): 2 via ORAL
  Administered 2019-08-24 (×2): 1 via ORAL
  Filled 2019-08-19 (×3): qty 2
  Filled 2019-08-19: qty 1
  Filled 2019-08-19 (×3): qty 2
  Filled 2019-08-19 (×2): qty 1
  Filled 2019-08-19: qty 2
  Filled 2019-08-19: qty 1
  Filled 2019-08-19 (×5): qty 2

## 2019-08-19 SURGICAL SUPPLY — 51 items
CANISTER SUCT 1200ML W/VALVE (MISCELLANEOUS) ×3 IMPLANT
COVER WAND RF STERILE (DRAPES) ×3 IMPLANT
DRAPE 3/4 80X56 (DRAPES) ×3 IMPLANT
DRAPE LAPAROTOMY 100X77 ABD (DRAPES) ×3 IMPLANT
DRAPE LAPAROTOMY TRNSV 106X77 (MISCELLANEOUS) IMPLANT
DRSG OPSITE POSTOP 4X14 (GAUZE/BANDAGES/DRESSINGS) ×3 IMPLANT
DRSG TELFA 3X8 NADH (GAUZE/BANDAGES/DRESSINGS) ×3 IMPLANT
ELECT BLADE 6 FLAT ULTRCLN (ELECTRODE) ×3 IMPLANT
ELECT CAUTERY BLADE 6.4 (BLADE) ×3 IMPLANT
ELECT REM PT RETURN 9FT ADLT (ELECTROSURGICAL) ×3
ELECTRODE REM PT RTRN 9FT ADLT (ELECTROSURGICAL) ×2 IMPLANT
GAUZE SPONGE 4X4 12PLY STRL (GAUZE/BANDAGES/DRESSINGS) ×3 IMPLANT
GLOVE BIO SURGEON STRL SZ7 (GLOVE) ×3 IMPLANT
GLOVE BIO SURGEON STRL SZ8 (GLOVE) ×6 IMPLANT
GLOVE INDICATOR 7.5 STRL GRN (GLOVE) ×3 IMPLANT
GLOVE INDICATOR 8.0 STRL GRN (GLOVE) ×6 IMPLANT
GOWN STRL REUS W/ TWL LRG LVL3 (GOWN DISPOSABLE) ×4 IMPLANT
GOWN STRL REUS W/ TWL XL LVL3 (GOWN DISPOSABLE) ×2 IMPLANT
GOWN STRL REUS W/TWL LRG LVL3 (GOWN DISPOSABLE) ×2
GOWN STRL REUS W/TWL XL LVL3 (GOWN DISPOSABLE) ×1
HOLDER FOLEY CATH W/STRAP (MISCELLANEOUS) ×3 IMPLANT
LIGASURE IMPACT 36 18CM CVD LR (INSTRUMENTS) ×3 IMPLANT
NDL HPO THNWL 1X22GA REG BVL (NEEDLE) ×2 IMPLANT
NEEDLE SAFETY 22GX1 (NEEDLE) ×1
NS IRRIG 1000ML POUR BTL (IV SOLUTION) ×3 IMPLANT
PACK BASIN MAJOR ARMC (MISCELLANEOUS) ×3 IMPLANT
PAD OB MATERNITY 4.3X12.25 (PERSONAL CARE ITEMS) ×3 IMPLANT
PENCIL ELECTRO HAND CTR (MISCELLANEOUS) ×3 IMPLANT
SET CYSTO W/LG BORE CLAMP LF (SET/KITS/TRAYS/PACK) ×3 IMPLANT
SPONGE LAP 18X18 RF (DISPOSABLE) ×9 IMPLANT
STAPLER SKIN PROX 35W (STAPLE) ×3 IMPLANT
STRAP SAFETY 5IN WIDE (MISCELLANEOUS) ×3 IMPLANT
STRIP CLOSURE SKIN 1/2X4 (GAUZE/BANDAGES/DRESSINGS) ×3 IMPLANT
SUT PDS AB 1 TP1 96 (SUTURE) ×6 IMPLANT
SUT PLAIN 2 0 XLH (SUTURE) ×9 IMPLANT
SUT VIC AB 0 CT1 27 (SUTURE) ×3
SUT VIC AB 0 CT1 27XCR 8 STRN (SUTURE) ×6 IMPLANT
SUT VIC AB 0 CT1 36 (SUTURE) ×6 IMPLANT
SUT VIC AB 0 CT2 27 (SUTURE) ×6 IMPLANT
SUT VIC AB 2-0 SH 27 (SUTURE) ×6
SUT VIC AB 2-0 SH 27XBRD (SUTURE) ×12 IMPLANT
SUT VIC AB 4-0 PS2 18 (SUTURE) ×3 IMPLANT
SUT VICRYL 0 27 CT2 27 ABS (SUTURE) ×9 IMPLANT
SUT VICRYL PLUS ABS 0 54 (SUTURE) ×6 IMPLANT
SYR BULB IRRIG 60ML STRL (SYRINGE) ×6 IMPLANT
SYR CONTROL 10ML LL (SYRINGE) ×3 IMPLANT
SYR TOOMEY IRRIG 70ML (MISCELLANEOUS) ×9
SYRINGE TOOMEY IRRIG 70ML (MISCELLANEOUS) ×6 IMPLANT
TOWEL OR 17X26 4PK STRL BLUE (TOWEL DISPOSABLE) ×3 IMPLANT
TRAY FOLEY MTR SLVR 16FR STAT (SET/KITS/TRAYS/PACK) ×3 IMPLANT
TRAY PREP VAG/GEN (MISCELLANEOUS) ×3 IMPLANT

## 2019-08-19 NOTE — Transfer of Care (Signed)
Immediate Anesthesia Transfer of Care Note  Patient: [name]  Procedure(s) Performed: HYSTERECTOMY ABDOMINAL WITH SALPINGO-OOPHORECTOMY WITH PELVIC WASHINGS PERITONEAL BIOPSIES AND PARA AORTIC LYMPH NODE DISSECTION (N/A Abdomen) OMENTECTOMY (N/A Abdomen)  Patient Location: PACU  Anesthesia Type:General  Level of Consciousness: drowsy  Airway & Oxygen Therapy: Patient Spontanous Breathing and Patient connected to face mask oxygen  Post-op Assessment: Report given to RN and Post -op Vital signs reviewed and stable  Post vital signs: Reviewed and stable  Last Vitals:  Vitals Value Taken Time  BP 137/62 08/19/19 1107  Temp    Pulse 79 08/19/19 1109  Resp 35 08/19/19 1109  SpO2 98 % 08/19/19 1109  Vitals shown include unvalidated device data.  Last Pain:  Vitals:   08/19/19 0634  TempSrc: Tympanic  PainSc: 0-No pain         Complications: No apparent anesthesia complications

## 2019-08-19 NOTE — Anesthesia Preprocedure Evaluation (Addendum)
Anesthesia Evaluation  Patient identified by MRN, date of birth, ID band Patient awake    Reviewed: Allergy & Precautions, NPO status , Patient's Chart, lab work & pertinent test results  History of Anesthesia Complications Negative for: history of anesthetic complications  Airway Mallampati: III       Dental  (+) Chipped, Dental Advisory Given,  Missing crown couple days ago:   Pulmonary neg pulmonary ROS, neg sleep apnea, neg COPD, Not current smoker,    Pulmonary exam normal        Cardiovascular hypertension, (-) Past MI and (-) CHF Normal cardiovascular exam(-) dysrhythmias (-) Valvular Problems/Murmurs     Neuro/Psych neg Seizures Anxiety negative neurological ROS     GI/Hepatic Neg liver ROS, GERD  Medicated and Controlled,  Endo/Other  negative endocrine ROSneg diabetes  Renal/GU negative Renal ROS  Female GU complaint     Musculoskeletal  (+) Arthritis ,   Abdominal Normal abdominal exam  (+)   Peds negative pediatric ROS (+)  Hematology   Anesthesia Other Findings Past Medical History: No date: Anxiety No date: Arthritis No date: GERD (gastroesophageal reflux disease) 09/02/2017: History of ischemic colitis No date: Hx of colonic polyp No date: Hypertension     Comment:  H/O-PT TAKEN OFF BY PCP AS OF 2019 No date: IBS (irritable bowel syndrome) No date: Ovarian cyst No date: Vaginal atrophy  Reproductive/Obstetrics                            Anesthesia Physical  Anesthesia Plan  ASA: II  Anesthesia Plan: General   Post-op Pain Management:    Induction: Intravenous  PONV Risk Score and Plan: 3 and Treatment may vary due to age or medical condition  Airway Management Planned: Oral ETT  Additional Equipment:   Intra-op Plan:   Post-operative Plan: Extubation in OR  Informed Consent: I have reviewed the patients History and Physical, chart, labs and discussed  the procedure including the risks, benefits and alternatives for the proposed anesthesia with the patient or authorized representative who has indicated his/her understanding and acceptance.     Dental advisory given  Plan Discussed with: CRNA and Surgeon  Anesthesia Plan Comments:         Anesthesia Quick Evaluation

## 2019-08-19 NOTE — Op Note (Addendum)
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  CONDITION: stable  INDICATIONS: 10 cm right ovarian mass  FINDINGS: Thin walled cyst on the surface of the right ovary.  Left adnexa, uterus and remained of abdominal structures normal.  The uterus was very small.  Frozen section on ovarian mas showed high grade adenocarcinoma.   PROCEDURE IN DETAIL: After informed consent was obtained, the patient was taken to the operating room where general endotracheal anesthesia was performed without difficulty. The patient was positioned in the supine position and the usual precautions taken with her arms in arm boards bilaterally. She was prepped and draped in normal sterile fashion. Time-out was performed and a Foley catheter was placed into the bladder.  A vertical midline lower abdominal incision was made in the lower abdomen and carried down to the underlying fascia. The fascia was incised in the midline, and the peritoneum was then entered. After the incision was extended the operative findings noted above were identified and washings performed. The bowel was freed up and placed up into the upper abdomen, packed away and a Bookwalter abdominal retractor was then placed. The right IP ligament was skeletonized after  identification of the ureter and then clamped, transected and ligated with a 2-0 Vicryl tie and suture.  The adnexa was sent for frozen section. The same procedure was then done on the left.  When the frozen section came back showing cancer the incision was extended to allow for further dissection including hysterectomy and staging.    Round ligaments were then divided on each side with electrocautery and the retroperitoneal space was opened bilaterally. The ureters were identified and preserved. The bladder flap was created with electrocautery and the bladder was dissected down off the lower uterine segment and cervix. The uterine arteries were then skeletonized bilaterally and transected with the Ligasure impact.  The cardinal ligaments were then clamped cut and divided and suture ligated with 2-0 Vicryl in several bites as well. The vaginal angles were then clamped with hysterectomy clamps, cut and suture ligated and tagged. The specimen was then amputated from the vagina using Mayo scissors.  The bladder was fairly sticky anteriorly and further sharp dissection was done to expose the anterior  vaginal wall. The vaginal cuff was then closed with 0 Vicryl suture in a figure-of-eight fashion with careful attention to include the vaginal cuff angles and the vaginal mucosa within the closure and avoid the bladder. A small amount of blood was seen in the urine, so the bladder was backfilled with 300 ml of sailne and was noted to be intact.   At this point attention was paid to the omentum were an infracolic omentectomy was performed using cautery to skeletonise the vascular pedicels and then transected with the Ligasure impact.  All the pedicles were hemostatic and the omentum was sent to pathology for permanent section. Peritoneal  biopsies were done of the right gutter and anterior bladder flap peritoneum.    A right pelvic and para-aortic lymph node sampling was performed after further developing the  perirectal and paravesicle spaces and identifying the ureter.  Visible ymph nodes were removed from the external iliac, internal iliac, common iliac and para-aortic area.    All pedicles were felt to be hemostatic so the laparotomy packs were removed and the fascia was then reapproximated in a running mass abdominal wall closure using 1 loop PDS. The subcutaneous space was then irrigated, and then closed with interrupted deep 3-0 plain sutures and then subdermal 2-0 Vicryls, followed by staples. The patient tolerated the procedure well. Sponge, lap, needle and instrument counts were correct x2 at the end of the procedure.   Antibiotics: Gentamycin and Clindamycin  VTE prophylaxis: was ordered perioperatively with IPCs and will start 28 days of Lovonox since cancer found.  Mellody Drown, MD

## 2019-08-19 NOTE — H&P (Addendum)
Date of Initial H&P: 08/03/2019  Update physical exam Respiratory CTAB, no increased work of breathing CV: RRR  History reviewed, patient examined, no change in status, stable for surgery.   [name], MD, [facility], [facility] 08/19/2019, 7:24 AM

## 2019-08-19 NOTE — Anesthesia Procedure Notes (Signed)
Procedure Name: Intubation Performed by: Alvin Critchley, MD Pre-anesthesia Checklist: Patient identified, Emergency Drugs available, Suction available, Patient being monitored and Timeout performed Patient Re-evaluated:Patient Re-evaluated prior to induction Oxygen Delivery Method: Circle system utilized Preoxygenation: Pre-oxygenation with 100% oxygen Induction Type: IV induction Ventilation: Mask ventilation without difficulty Laryngoscope Size: McGraph Grade View: Grade II Tube type: Oral Tube size: 7.0 mm Number of attempts: 1 Airway Equipment and Method: Video-laryngoscopy Placement Confirmation: ETT inserted through vocal cords under direct vision and positive ETCO2 Secured at: 21 cm Dental Injury: Teeth and Oropharynx as per pre-operative assessment

## 2019-08-20 LAB — BASIC METABOLIC PANEL
Anion gap: 9 (ref 5–15)
BUN: 9 mg/dL (ref 8–23)
CO2: 22 mmol/L (ref 22–32)
Calcium: 9.2 mg/dL (ref 8.9–10.3)
Chloride: 102 mmol/L (ref 98–111)
Creatinine, Ser: 0.66 mg/dL (ref 0.44–1.00)
GFR calc Af Amer: >60 mL/min (ref >60)
GFR calc non Af Amer: >60 mL/min (ref >60)
Glucose, Bld: 148 mg/dL — ABNORMAL HIGH (ref 70–99)
Potassium: 3.8 mmol/L (ref 3.5–5.1)
Sodium: 133 mmol/L — ABNORMAL LOW (ref 135–145)

## 2019-08-20 LAB — CBC
HCT: 33.3 % — ABNORMAL LOW (ref 36.0–46.0)
Hemoglobin: 10.9 g/dL — ABNORMAL LOW (ref 12.0–15.0)
MCH: 27.9 pg (ref 26.0–34.0)
MCHC: 32.7 g/dL (ref 30.0–36.0)
MCV: 85.2 fL (ref 80.0–100.0)
Platelets: 236 10*3/uL (ref 150–400)
RBC: 3.91 MIL/uL (ref 3.87–5.11)
RDW: 14.8 % (ref 11.5–15.5)
WBC: 10.3 10*3/uL (ref 4.0–10.5)
nRBC: 0 % (ref 0.0–0.2)

## 2019-08-20 MED ORDER — ENOXAPARIN SODIUM 40 MG/0.4ML ~~LOC~~ SOLN
40.0000 mg | SUBCUTANEOUS | Status: DC
  Administered 2019-08-20 – 2019-08-23 (×4): 40 mg via SUBCUTANEOUS
  Filled 2019-08-20 (×5): qty 0.4

## 2019-08-20 MED ORDER — IBUPROFEN 600 MG PO TABS
600.0000 mg | ORAL_TABLET | Freq: Four times a day (QID) | ORAL | Status: DC | PRN
  Administered 2019-08-20 – 2019-08-24 (×12): 600 mg via ORAL
  Filled 2019-08-20 (×15): qty 1

## 2019-08-20 NOTE — Anesthesia Postprocedure Evaluation (Signed)
Anesthesia Post Note  Patient: Carrie Roberson  Procedure(s) Performed: HYSTERECTOMY ABDOMINAL WITH SALPINGO-OOPHORECTOMY WITH PELVIC WASHINGS PERITONEAL BIOPSIES AND PARA AORTIC LYMPH NODE DISSECTION (N/A Abdomen) OMENTECTOMY (N/A Abdomen)  Patient location during evaluation: PACU Anesthesia Type: General Level of consciousness: awake and alert and oriented Pain management: pain level controlled Vital Signs Assessment: post-procedure vital signs reviewed and stable Respiratory status: spontaneous breathing Cardiovascular status: blood pressure returned to baseline Anesthetic complications: no     Last Vitals:  Vitals:   08/20/19 0324 08/20/19 0827  BP: 135/70 123/68  Pulse: 82 74  Resp: 20 18  Temp: 36.9 C 36.7 C  SpO2: 93% 96%    Last Pain:  Vitals:   08/20/19 0827  TempSrc: Oral  PainSc:                  Carrie Roberson

## 2019-08-20 NOTE — Plan of Care (Signed)
Vs stable; tolerating liquids and ready to try regular diet; taking morphine and percocet for pain control; did ambulate in hallway last night with RN; foley removed at 0630

## 2019-08-20 NOTE — Progress Notes (Signed)
   Subjective: Patient reports incisional pain.  Pain is well-controlled on current  analgesic regimen..  Tolerating po: Yes, cracker and liquids.  Has not passed flatus.  She did ambulate some last night  Objective: Vital signs in last 24 hours: Temp:  [98 F (36.7 C)-98.9 F (37.2 C)] 98.5 F (36.9 C) (01/28 0324) Pulse Rate:  [79-105] 82 (01/28 0324) Resp:  [13-26] 20 (01/28 0324) BP: (108-150)/(53-90) 135/70 (01/28 0324) SpO2:  [91 %-99 %] 93 % (01/28 0324)    Intake/Output from previous day: 01/27 0701 - 01/28 0700 In: 2389.9 [I.V.:2289.9; IV Piggyback:100] Out: 2520 [Urine:2420; Blood:100]  Physical Examination: Gen: NAD Pulm: no increased work of breathing Abdomen: hypoactive BS, soft, mildly distended and tympanic to percussion, appropriately tender around incision site.  Incision D/C/I staple line Ext: no edema, SCD's in place  Labs: Results for orders placed or performed during the hospital encounter of 08/19/19 (from the past 72 hour(s))  ABO/Rh     Status: None   Collection Time: 08/19/19  6:45 AM  Result Value Ref Range   ABO/RH(D)      A POS Performed at [facility], [address] [place], [place] [address]   CBC     Status: Abnormal   Collection Time: 08/20/19  6:20 AM  Result Value Ref Range   WBC 10.3 4.0 - 10.5 K/uL   RBC 3.91 3.87 - 5.11 MIL/uL   Hemoglobin 10.9 (L) 12.0 - 15.0 g/dL   HCT 33.3 (L) 36.0 - 46.0 %   MCV 85.2 80.0 - 100.0 fL   MCH 27.9 26.0 - 34.0 pg   MCHC 32.7 30.0 - 36.0 g/dL   RDW 14.8 11.5 - 15.5 %   Platelets 236 150 - 400 K/uL   nRBC 0.0 0.0 - 0.2 %    Comment: Performed at [facility], [address] [place], [place] [id number]  Basic metabolic panel     Status: Abnormal   Collection Time: 08/20/19  6:20 AM  Result Value Ref Range   Sodium 133 (L) 135 - 145 mmol/L   Potassium 3.8 3.5 - 5.1 mmol/L   Chloride 102 98 - 111 mmol/L   CO2 22 22 - 32 mmol/L   Glucose, Bld 148 (H) 70 - 99 mg/dL   BUN 9 8  - 23 mg/dL   Creatinine, Ser 0.66 0.44 - 1.00 mg/dL   Calcium 9.2 8.9 - 10.3 mg/dL   GFR calc non Af Amer >60 >60 mL/min   GFR calc Af Amer >60 >60 mL/min   Anion gap 9 5 - 15    Comment: Performed at [facility], [address], [place], [place] [address]     Assessment:  74 y.o. s/p 1 Day Post-Op Procedure(s) (LRB): HYSTERECTOMY ABDOMINAL WITH SALPINGO-OOPHORECTOMY WITH PELVIC WASHINGS PERITONEAL BIOPSIES AND PARA AORTIC LYMPH NODE DISSECTION (N/A) OMENTECTOMY (N/A) : stable  Plan: 1) Pain: continue percocet po, add ibuprofen.  Will trial abdominal binder  2) Heme: acute blood loss anemia: appropriate for procedure, hemodynamically stable  3) FEN: Advance diet, continue IV fluid, decrease to 56mL/hr as po intake increases, discontinue foley catheter  4) Prophylaxis: intermittent pneumatic compression boots.  Will start lovenox 40mg  SQ today, continue for 28 days  5) Disposition: The patient is to be discharged to pending return of bowl function.  [name], MD, [facility], [facility] 08/20/2019, 7:26 AM

## 2019-08-21 MED ORDER — SODIUM CHLORIDE 0.9 % IV SOLN: INTRAVENOUS | Status: DC

## 2019-08-21 NOTE — Plan of Care (Signed)
Vs stable; up with assistance but moving really well; voiding well; taking percocet and motrin for pain control

## 2019-08-21 NOTE — Progress Notes (Signed)
   Subjective: Patient reports incisional pain.  Pain is well-controlled on current  analgesic regimen..  Tolerating po: Yes.  No flatus  Objective: Vital signs in last 24 hours: Temp:  [97.8 F (36.6 C)-98.5 F (36.9 C)] 98.1 F (36.7 C) (01/29 0846) Pulse Rate:  [71-86] 71 (01/29 0846) Resp:  [18-20] 18 (01/29 0846) BP: (95-141)/(59-67) 129/59 (01/29 0846) SpO2:  [95 %-99 %] 98 % (01/29 0846)    Intake/Output from previous day: 01/28 0701 - 01/29 0700 In: 2177.6 [I.V.:2177.6] Out: 2675 [Urine:2675]  Physical Examination: General: NAD Pulmonary: no increased work of breathing Abdomen: BS remain hypoactive but increased from yesterday, soft, non-tender, non-distended, incision D/C/I Extremities: no edema Neurologic: normal gait   Labs: Results for orders placed or performed during the hospital encounter of 08/19/19 (from the past 72 hour(s))  ABO/Rh     Status: None   Collection Time: 08/19/19  6:45 AM  Result Value Ref Range   ABO/RH(D)      A POS Performed at [facility], [address] [place], [place] [address]   CBC     Status: Abnormal   Collection Time: 08/20/19  6:20 AM  Result Value Ref Range   WBC 10.3 4.0 - 10.5 K/uL   RBC 3.91 3.87 - 5.11 MIL/uL   Hemoglobin 10.9 (L) 12.0 - 15.0 g/dL   HCT 33.3 (L) 36.0 - 46.0 %   MCV 85.2 80.0 - 100.0 fL   MCH 27.9 26.0 - 34.0 pg   MCHC 32.7 30.0 - 36.0 g/dL   RDW 14.8 11.5 - 15.5 %   Platelets 236 150 - 400 K/uL   nRBC 0.0 0.0 - 0.2 %    Comment: Performed at [facility], [address] [place], [place] [id number]  Basic metabolic panel     Status: Abnormal   Collection Time: 08/20/19  6:20 AM  Result Value Ref Range   Sodium 133 (L) 135 - 145 mmol/L   Potassium 3.8 3.5 - 5.1 mmol/L   Chloride 102 98 - 111 mmol/L   CO2 22 22 - 32 mmol/L   Glucose, Bld 148 (H) 70 - 99 mg/dL   BUN 9 8 - 23 mg/dL   Creatinine, Ser 0.66 0.44 - 1.00 mg/dL   Calcium 9.2 8.9 - 10.3 mg/dL   GFR calc non Af  Amer >60 >60 mL/min   GFR calc Af Amer >60 >60 mL/min   Anion gap 9 5 - 15    Comment: Performed at [facility], [address] [place], [place] [address]     Assessment:  74 y.o. s/p 2 Days Post-Op Procedure(s) (LRB): HYSTERECTOMY ABDOMINAL WITH SALPINGO-OOPHORECTOMY WITH PELVIC WASHINGS PERITONEAL BIOPSIES AND PARA AORTIC LYMPH NODE DISSECTION (N/A) OMENTECTOMY (N/A) : stable  Plan: 1) Pain: well controlled on current regime  2) Heme: hemodynamically stable  3) FEN: general diet, switch from D5 1/2 NS at 67mL/hr to NS KVO  4) Prophylaxis: intermittent pneumatic compression boots and lovenox 40mg  SQ daily.  5) Disposition: pending return of bowl function  [name], MD, [place], [facility] 08/21/2019, 12:54 PM

## 2019-08-21 NOTE — Progress Notes (Signed)
At 2130 RN called into room by pt. Pt stated she felt really nauseous and felt like she had to have a BM. Helped pt ambulate to restroom.  Pt had x1 small emesis and per patient, "it was the soup I ate for lunch". Pt sat on toilet for about 40 minutes and passed lots of gas. No BM.   Nurse gave dose of zofran IV at this time.

## 2019-08-21 NOTE — Care Management Important Message (Signed)
Important Message  Patient Details  Name: Carrie Roberson MRN: [id number] Date of Birth: August 24, 1945   Medicare Important Message Given:  N/A - LOS <3 / Initial given by admissions     Dannette Barbara 08/21/2019, 8:46 AM

## 2019-08-22 LAB — COMPREHENSIVE METABOLIC PANEL
ALT: 140 U/L — ABNORMAL HIGH (ref 0–44)
AST: 59 U/L — ABNORMAL HIGH (ref 15–41)
Albumin: 3 g/dL — ABNORMAL LOW (ref 3.5–5.0)
Alkaline Phosphatase: 141 U/L — ABNORMAL HIGH (ref 38–126)
Anion gap: 9 (ref 5–15)
BUN: 7 mg/dL — ABNORMAL LOW (ref 8–23)
CO2: 23 mmol/L (ref 22–32)
Calcium: 9.5 mg/dL (ref 8.9–10.3)
Chloride: 105 mmol/L (ref 98–111)
Creatinine, Ser: 0.5 mg/dL (ref 0.44–1.00)
GFR calc Af Amer: >60 mL/min (ref >60)
GFR calc non Af Amer: >60 mL/min (ref >60)
Glucose, Bld: 92 mg/dL (ref 70–99)
Potassium: 4 mmol/L (ref 3.5–5.1)
Sodium: 137 mmol/L (ref 135–145)
Total Bilirubin: 1.1 mg/dL (ref 0.3–1.2)
Total Protein: 6.1 g/dL — ABNORMAL LOW (ref 6.5–8.1)

## 2019-08-22 LAB — CBC
HCT: 32.7 % — ABNORMAL LOW (ref 36.0–46.0)
Hemoglobin: 10.6 g/dL — ABNORMAL LOW (ref 12.0–15.0)
MCH: 28 pg (ref 26.0–34.0)
MCHC: 32.4 g/dL (ref 30.0–36.0)
MCV: 86.5 fL (ref 80.0–100.0)
Platelets: 227 10*3/uL (ref 150–400)
RBC: 3.78 MIL/uL — ABNORMAL LOW (ref 3.87–5.11)
RDW: 14.6 % (ref 11.5–15.5)
WBC: 5 10*3/uL (ref 4.0–10.5)
nRBC: 0 % (ref 0.0–0.2)

## 2019-08-22 LAB — MAGNESIUM: Magnesium: 1.8 mg/dL (ref 1.7–2.4)

## 2019-08-22 MED ORDER — DOCUSATE SODIUM 100 MG PO CAPS
100.0000 mg | ORAL_CAPSULE | Freq: Two times a day (BID) | ORAL | Status: DC
  Administered 2019-08-22 – 2019-08-24 (×5): 100 mg via ORAL
  Filled 2019-08-22 (×5): qty 1

## 2019-08-22 MED ORDER — SODIUM CHLORIDE 0.9 % IV SOLN: 8.0000 mg | Freq: Three times a day (TID) | INTRAVENOUS | Status: DC

## 2019-08-22 MED ORDER — ONDANSETRON HCL 4 MG/2ML IJ SOLN
4.0000 mg | INTRAMUSCULAR | Status: AC
  Administered 2019-08-22 (×2): 4 mg via INTRAVENOUS
  Filled 2019-08-22 (×2): qty 2

## 2019-08-22 MED ORDER — ONDANSETRON HCL 4 MG PO TABS
4.0000 mg | ORAL_TABLET | Freq: Four times a day (QID) | ORAL | Status: DC | PRN
  Administered 2019-08-22 – 2019-08-24 (×6): 4 mg via ORAL
  Filled 2019-08-22 (×6): qty 1

## 2019-08-22 MED ORDER — BISACODYL 10 MG RE SUPP
10.0000 mg | Freq: Every day | RECTAL | Status: DC | PRN
  Administered 2019-08-22: 10 mg via RECTAL
  Filled 2019-08-22: qty 1

## 2019-08-22 MED ORDER — HYDROCHLOROTHIAZIDE 25 MG PO TABS
25.0000 mg | ORAL_TABLET | Freq: Every day | ORAL | Status: DC
  Administered 2019-08-22 – 2019-08-24 (×3): 25 mg via ORAL
  Filled 2019-08-22 (×4): qty 1

## 2019-08-22 MED ORDER — SODIUM CHLORIDE 0.9 % IV SOLN
8.0000 mg | Freq: Four times a day (QID) | INTRAVENOUS | Status: DC | PRN
  Filled 2019-08-22 (×2): qty 4

## 2019-08-22 NOTE — Progress Notes (Signed)
Subjective: Patient reports  incisional pain, tolerating PO and + flatus, one episode of emesis yesterday evening.  Pain is well-controlled on current  analgesic regimen..  Tolerating po: Yes   Objective: Vital signs in last 24 hours: Temp:  [97.8 F (36.6 C)-98.3 F (36.8 C)] 98.3 F (36.8 C) (01/30 0749) Pulse Rate:  [79-94] 92 (01/30 0749) Resp:  [18-19] 18 (01/30 0749) BP: (134-175)/(59-81) 156/81 (01/30 0749) SpO2:  [96 %-99 %] 96 % (01/30 0749)    Intake/Output from previous day: 01/29 0701 - 01/30 0700 In: 240 [P.O.:240] Out: 900 [Urine:900]  Physical Examination: General: NAD Pulmonary: no increased work of breathing Abdomen: NABS, soft, non-tender, non-distended, incision D/C/I Extremities: no edema Neurologic: normal gait   Labs: Results for orders placed or performed during the hospital encounter of 08/19/19 (from the past 72 hour(s))  CBC     Status: Abnormal   Collection Time: 08/20/19  6:20 AM  Result Value Ref Range   WBC 10.3 4.0 - 10.5 K/uL   RBC 3.91 3.87 - 5.11 MIL/uL   Hemoglobin 10.9 (L) 12.0 - 15.0 g/dL   HCT 33.3 (L) 36.0 - 46.0 %   MCV 85.2 80.0 - 100.0 fL   MCH 27.9 26.0 - 34.0 pg   MCHC 32.7 30.0 - 36.0 g/dL   RDW 14.8 11.5 - 15.5 %   Platelets 236 150 - 400 K/uL   nRBC 0.0 0.0 - 0.2 %    Comment: Performed at [facility], [address] [place], [place] [id number]  Basic metabolic panel     Status: Abnormal   Collection Time: 08/20/19  6:20 AM  Result Value Ref Range   Sodium 133 (L) 135 - 145 mmol/L   Potassium 3.8 3.5 - 5.1 mmol/L   Chloride 102 98 - 111 mmol/L   CO2 22 22 - 32 mmol/L   Glucose, Bld 148 (H) 70 - 99 mg/dL   BUN 9 8 - 23 mg/dL   Creatinine, Ser 0.66 0.44 - 1.00 mg/dL   Calcium 9.2 8.9 - 10.3 mg/dL   GFR calc non Af Amer >60 >60 mL/min   GFR calc Af Amer >60 >60 mL/min   Anion gap 9 5 - 15    Comment: Performed at [facility], [address] [place], [place] [address]  Comprehensive  metabolic panel     Status: Abnormal   Collection Time: 08/22/19  6:59 AM  Result Value Ref Range   Sodium 137 135 - 145 mmol/L   Potassium 4.0 3.5 - 5.1 mmol/L   Chloride 105 98 - 111 mmol/L   CO2 23 22 - 32 mmol/L   Glucose, Bld 92 70 - 99 mg/dL   BUN 7 (L) 8 - 23 mg/dL   Creatinine, Ser 0.50 0.44 - 1.00 mg/dL   Calcium 9.5 8.9 - 10.3 mg/dL   Total Protein 6.1 (L) 6.5 - 8.1 g/dL   Albumin 3.0 (L) 3.5 - 5.0 g/dL   AST 59 (H) 15 - 41 U/L   ALT 140 (H) 0 - 44 U/L   Alkaline Phosphatase 141 (H) 38 - 126 U/L   Total Bilirubin 1.1 0.3 - 1.2 mg/dL   GFR calc non Af Amer >60 >60 mL/min   GFR calc Af Amer >60 >60 mL/min   Anion gap 9 5 - 15    Comment: Performed at [facility], [address], [place], [place] [address]  CBC     Status: Abnormal   Collection Time: 08/22/19  6:59 AM  Result Value Ref Range   WBC 5.0 4.0 - 10.5 K/uL   RBC 3.78 (L) 3.87 - 5.11 MIL/uL   Hemoglobin 10.6 (L) 12.0 - 15.0 g/dL   HCT 32.7 (L) 36.0 - 46.0 %   MCV 86.5 80.0 - 100.0 fL   MCH 28.0 26.0 - 34.0 pg   MCHC 32.4 30.0 - 36.0 g/dL   RDW 14.6 11.5 - 15.5 %   Platelets 227 150 - 400 K/uL   nRBC 0.0 0.0 - 0.2 %    Comment: Performed at [facility], [address], [place], [place] [address]  Magnesium     Status: None   Collection Time: 08/22/19  6:59 AM  Result Value Ref Range   Magnesium 1.8 1.7 - 2.4 mg/dL    Comment: Performed at [facility], [address] [place], [place] [address]     Assessment:  74 y.o. s/p 3 Days Post-Op Procedure(s) (LRB): HYSTERECTOMY ABDOMINAL WITH SALPINGO-OOPHORECTOMY WITH PELVIC WASHINGS PERITONEAL BIOPSIES AND PARA AORTIC LYMPH NODE DISSECTION (N/A) OMENTECTOMY (N/A) : stable  Plan: 1) Pain: continue percocet, ibuprofen  2) Heme: hemodynamically stable  3) FEN: Discontinue IV fluids  4) Prophylaxis: pharmacologic prophylaxis (with any of the following: enoxaparin (Lovenox) 40mg  SQ 2 hours prior to surgery then every  day).  5) Disposition: The patient is to be discharged to home likely tomorrow.  [name], MD, [facility], [facility] 08/22/2019, 9:08 AM

## 2019-08-22 NOTE — Progress Notes (Signed)
Pt gave own Levonox injection with good technique.  Will be able to manage at home.

## 2019-08-22 NOTE — Progress Notes (Signed)
Vital signs have remained stable throughout the night, despite an inaccurate reading for BP around midnight with her sweater sleeve.   Pt started feeling bad (nauseous and a lot of pressure to have a BM) around 2130 last night. The uncomfortable feeling has remained through the morning.   Pain has been controlled with motrin, percocet, and morphine (only when she could not take the PO meds, she requested this).   Pt only vomited once. Nurse gave Zofran IV at time nauseous feeling started and gave again at next time it was due.   Pt is burping and passing gas frequently now. Still no BM. Still feeling under the weather due to the episode last night but pt did get some rest for about 3 hours early this morning.   Will continue to monitor.

## 2019-08-22 NOTE — Progress Notes (Signed)
Dr. Georgianne Fick notified of Pt. B/P of 161/65 and Pt. C/o right medial thigh having intermittent "stinging" Pain. Pedal pulses equal and strong and cap. Refill < 3 seconds. No swelling or warmth noted in legs. Dr. Georgianne Fick stated pain most likely due to right side Pelvic surgery. Dr. Georgianne Fick also stated he will order B/P medication.

## 2019-08-23 MED ORDER — FLEET ENEMA 7-19 GM/118ML RE ENEM
1.0000 | ENEMA | Freq: Once | RECTAL | Status: AC
  Administered 2019-08-23: 1 via RECTAL

## 2019-08-23 NOTE — Progress Notes (Signed)
Subjective: Patient reports incisional pain, + flatus, + BM and no problems voiding.  Had two small bowl movement yesterday but did not feel these were very large and is still feeling constipated.  Pain is well-controlled on current  analgesic regimen..  Tolerating po: Yes  She has also noted some discomfort and burning sensation medial thigh.  Objective: Vital signs in last 24 hours: Temp:  [98 F (36.7 C)-98.4 F (36.9 C)] 98 F (36.7 C) (01/31 0926) Pulse Rate:  [77-86] 77 (01/31 0926) Resp:  [16-20] 18 (01/31 0926) BP: (117-173)/(59-77) 152/77 (01/31 0926) SpO2:  [93 %-99 %] 98 % (01/31 0926) Last BM Date: 08/19/19(Per Pt.)  Intake/Output from previous day: 01/30 0701 - 01/31 0700 In: 600 [P.O.:600] Out: 1 [Stool:1]  Physical Examination: General: NAD Pulmonary: no increased work of breathing Abdomen: NABS, soft, non-tender, non-distended, incision D/C/I Extremities: no edema Neurologic: normal gait   Labs: Results for orders placed or performed during the hospital encounter of 08/19/19 (from the past 72 hour(s))  Comprehensive metabolic panel     Status: Abnormal   Collection Time: 08/22/19  6:59 AM  Result Value Ref Range   Sodium 137 135 - 145 mmol/L   Potassium 4.0 3.5 - 5.1 mmol/L   Chloride 105 98 - 111 mmol/L   CO2 23 22 - 32 mmol/L   Glucose, Bld 92 70 - 99 mg/dL   BUN 7 (L) 8 - 23 mg/dL   Creatinine, Ser 0.50 0.44 - 1.00 mg/dL   Calcium 9.5 8.9 - 10.3 mg/dL   Total Protein 6.1 (L) 6.5 - 8.1 g/dL   Albumin 3.0 (L) 3.5 - 5.0 g/dL   AST 59 (H) 15 - 41 U/L   ALT 140 (H) 0 - 44 U/L   Alkaline Phosphatase 141 (H) 38 - 126 U/L   Total Bilirubin 1.1 0.3 - 1.2 mg/dL   GFR calc non Af Amer >60 >60 mL/min   GFR calc Af Amer >60 >60 mL/min   Anion gap 9 5 - 15    Comment: Performed at [facility], [address] [place], [place] [address]  CBC     Status: Abnormal   Collection Time: 08/22/19  6:59 AM  Result Value Ref Range   WBC 5.0 4.0 - 10.5  K/uL   RBC 3.78 (L) 3.87 - 5.11 MIL/uL   Hemoglobin 10.6 (L) 12.0 - 15.0 g/dL   HCT 32.7 (L) 36.0 - 46.0 %   MCV 86.5 80.0 - 100.0 fL   MCH 28.0 26.0 - 34.0 pg   MCHC 32.4 30.0 - 36.0 g/dL   RDW 14.6 11.5 - 15.5 %   Platelets 227 150 - 400 K/uL   nRBC 0.0 0.0 - 0.2 %    Comment: Performed at [facility], [address] [place], [place] [address]  Magnesium     Status: None   Collection Time: 08/22/19  6:59 AM  Result Value Ref Range   Magnesium 1.8 1.7 - 2.4 mg/dL    Comment: Performed at [facility], [address] [place], [place] [address]     Assessment:  74 y.o. s/p 4 Days Post-Op Procedure(s) (LRB): HYSTERECTOMY ABDOMINAL WITH SALPINGO-OOPHORECTOMY WITH PELVIC WASHINGS PERITONEAL BIOPSIES AND PARA AORTIC LYMPH NODE DISSECTION (N/A) OMENTECTOMY (N/A) : stable  Plan: 1) Pain: continue ibuprofen and percocet  2) Heme/CV: - acute blood loss anemia, hemodynamically stable and asymptomatic, no po iron until regulation in bowl movements - started on HCTZ 25mg  yesterday for HTN  3) FEN: continue general diet, IV discontinued.  Still reports poor intake despite passing flatus and good bowl sound.  Problems with constipation at baseline so concerned about that getting worse.  Will add fleet enema today  4) Prophylaxis: SCD and Lovenox 40mg  SQ daily.  5) Paresthesia - suspect transient inflammation aroudn nerve root to thigh from lymph node dissection as patient was not in stirups during case  6Disposition: Anticipate discharge tomorrow.  [name], MD, [facility], [facility] 08/23/2019, 10:01 AM

## 2019-08-23 NOTE — Progress Notes (Addendum)
Pt complains of "feeling like I need to go"but has not produced a BM this shift. Previous shift reported 2 occurances, one small one normal amt BM. Pt denies that she is having adequate BM's, requested intervention during rounds this AM. MD ordered Fleets enema, given at 1600 with no results seen at this point. Pt reports going to BR and trying but nothing is produced. Pt is voiding normally, fluids encouraged and PO intake is minimal, pt only ate 25% of fruit plate (grapes, pineapple) around noon, with 2 graham crackers and 50% of yogurt cup. Reports feeling bloated. Passing gas and burping frequently, has ambulated several laps around nurses station on 3 separate occurances. BP's elevated but stable, within parameters. Pt complains of mild cough, non-productive, lung sounds clear, encouraged use of incentive spirometer. Pt reports MD told her not to use it. Advised pt it is recommended post-op to decrease risk of PNA. Pt agreeable. Oncoming shift updated, will continue to monitor.

## 2019-08-23 NOTE — Progress Notes (Signed)
Patient states she "only had 3/4 cup of water all day". PO fluids and ambulation encouraged; patient verbalized understanding of same. Patient states she has "not had a bowel movement since I got here" on 08/19/2019. Patient states "nothing" related to results from fleets enema given around 1600 today.

## 2019-08-23 NOTE — Progress Notes (Signed)
Alert and oriented with pleasant affect. Color good, skin w&d. BBS clear. Abdomen soft and non-tender to mild palpation. Positive Bowel sounds all four quadrants. Mid-line incision with Honeycomb dressing in place;minimum amount of dried drainage. C/O pain at 0411 and was medicated with Ibuprofen as per PRN order; relief as evidenced by sleep. Dulcolax Suppository at 2021 and had small, loose brown BM at 0400. Passing a large amount of Flatus and Burping frequently. Zofran for c/o Nausea at 2020 and 0411. B/P prior to Hydralazine was 161/65-157/68. Hydralazine was administered at 2207 as per order. B/P at 2353 was 155/77. B/P was taken immediatly after ambulation to BR at 0407 and B/P was 176/62. B/P repeated at 0409 and was 150/66. She has abulated in hall with slow, steady gait and tolerated well. She sat up in the chair for appx. 30 minutes after ambulation. She tolerated cracker and Ginger Ale at 0410. Pt. Appears to have rested well and appears comfortable and in NAD.

## 2019-08-23 NOTE — Progress Notes (Addendum)
Dr Kenton Kingfisher paged and called back. Nurse updated MD on patient concerned about not having a bowel movement since 08/19/2019.MD notified of patient given a suppository last night and patient reported results as "only tiny amount and mostly mucus". MD notified of patient given fleets enema this afternoon around 1600 and patient states "no results". Dr. Kenton Kingfisher states "Dr. Star Age is her attending and she can talk with him about this in the morning".

## 2019-08-24 LAB — SURGICAL PATHOLOGY

## 2019-08-24 LAB — CYTOLOGY - NON PAP

## 2019-08-24 MED ORDER — HYDROCHLOROTHIAZIDE 25 MG PO TABS: 25.0000 mg | ORAL_TABLET | Freq: Every day | ORAL | 0 refills | Status: DC

## 2019-08-24 MED ORDER — OXYCODONE-ACETAMINOPHEN 5-325 MG PO TABS: 1.0000 | ORAL_TABLET | ORAL | 0 refills | Status: DC | PRN

## 2019-08-24 MED ORDER — IBUPROFEN 600 MG PO TABS: 600.0000 mg | ORAL_TABLET | Freq: Four times a day (QID) | ORAL | 0 refills | Status: DC | PRN

## 2019-08-24 MED ORDER — DOCUSATE SODIUM 100 MG PO CAPS: 100.0000 mg | ORAL_CAPSULE | Freq: Two times a day (BID) | ORAL | 0 refills | Status: DC

## 2019-08-24 MED ORDER — ENOXAPARIN SODIUM 40 MG/0.4ML ~~LOC~~ SOLN: 40.0000 mg | SUBCUTANEOUS | 0 refills | Status: DC

## 2019-08-24 NOTE — Care Management Important Message (Signed)
Important Message  Patient Details  Name: Carrie Roberson MRN: [id number] Date of Birth: 1945/11/13   Medicare Important Message Given:  Yes     Dannette Barbara 08/24/2019, 11:41 AM

## 2019-08-24 NOTE — Discharge Summary (Signed)
Physician Discharge Summary  Patient ID: Carrie Roberson MRN: [id number] DOB/AGE: August 30, 1945 74 y.o.  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  The patient postoperative course was benign.  She had a slow return of bowl function but was meeting all postoperative goals by postoperative day 5 including remaining hemodynamically stable, tolerating po, passing flatus, voiding spontaneously, ambulating, and reporting good pain control on current po analgesic regimen.  Shewas noted to be slightly hypertensive postoperatively and was started on HCTZ 25mg  po daily.  Final pathology pending additional stains.  Plan is for follow up with gyn-onc in 2 weeks myself in 10 days for staple removal.  She will be maintained on ppx Lovenox for a total of 28 days postop.  Consults: None  Significant Diagnostic Studies:  Results for orders placed or performed during the hospital encounter of 08/19/19 (from the past 72 hour(s))  Comprehensive metabolic panel     Status: Abnormal   Collection Time: 08/22/19  6:59 AM  Result Value Ref Range   Sodium 137 135 - 145 mmol/L   Potassium 4.0 3.5 - 5.1 mmol/L   Chloride 105 98 - 111 mmol/L   CO2 23 22 - 32 mmol/L   Glucose, Bld 92 70 - 99 mg/dL   BUN 7 (L) 8 - 23 mg/dL   Creatinine, Ser 0.50 0.44 - 1.00 mg/dL   Calcium 9.5 8.9 - 10.3 mg/dL   Total Protein 6.1 (L) 6.5 - 8.1 g/dL   Albumin 3.0 (L) 3.5 - 5.0 g/dL   AST 59 (H) 15 - 41 U/L   ALT 140 (H) 0 - 44  U/L   Alkaline Phosphatase 141 (H) 38 - 126 U/L   Total Bilirubin 1.1 0.3 - 1.2 mg/dL   GFR calc non Af Amer >60 >60 mL/min   GFR calc Af Amer >60 >60 mL/min   Anion gap 9 5 - 15    Comment: Performed at [facility], [address] [place], [place] [address]  CBC     Status: Abnormal   Collection Time: 08/22/19  6:59 AM  Result Value Ref Range   WBC 5.0 4.0 - 10.5 K/uL   RBC 3.78 (L) 3.87 - 5.11 MIL/uL   Hemoglobin 10.6 (L) 12.0 - 15.0 g/dL   HCT 32.7 (L) 36.0 - 46.0 %   MCV 86.5 80.0 - 100.0 fL   MCH 28.0 26.0 - 34.0 pg   MCHC 32.4 30.0 - 36.0 g/dL   RDW 14.6 11.5 - 15.5 %   Platelets 227 150 - 400 K/uL   nRBC 0.0 0.0 - 0.2 %    Comment: Performed at [facility], [address] [place], [place] [address]  Magnesium     Status: None   Collection Time: 08/22/19  6:59 AM  Result Value Ref Range   Magnesium 1.8 1.7 - 2.4 mg/dL    Comment: Performed at [facility], [address] [place], [place] [address]     Treatments: IV hydration and surgery: TAH, BSO, omentectomy, right pelvic and paraortic lymph node dissection  Discharge Exam: Blood pressure 126/65, pulse 72, temperature 97.8 F (36.6 C), temperature source Oral, resp. rate 18, height 5\' 3"  (1.6 m), weight 86.6 kg, SpO2 93 %. General appearance: alert, appears stated age and no distress Resp: no increased work of breathing GI: soft, non-tender; bowel sounds normal; no masses,  no organomegaly Extremities: extremities normal, atraumatic, no cyanosis or edema  Incision: D/C/I staple line  Disposition: Discharge disposition: 01-Home or Self Care       Discharge Instructions    Call MD for:   Complete by: As directed    Heavy vaginal bleeding greater than 1 pad an hour   Call MD for:  difficulty breathing, headache or visual disturbances   Complete by: As directed    Call MD for:  extreme fatigue   Complete by: As directed    Call MD for:  hives   Complete by: As directed     Call MD for:  persistant dizziness or light-headedness   Complete by: As directed    Call MD for:  persistant nausea and vomiting   Complete by: As directed    Call MD for:  redness, tenderness, or signs of infection (pain, swelling, redness, odor or green/yellow discharge around incision site)   Complete by: As directed    Call MD for:  severe uncontrolled pain   Complete by: As directed    Call MD for:  temperature >100.4   Complete by: As directed    Diet general   Complete by: As directed    Discharge wound care:   Complete by: As directed    You may apply a light dressing for minor discharge from the incision or to keep waistbands of clothing from rubbing.  You may also have been discharge with a clear dressing in which case this will be removed at your postoperative clinic visit.  You may shower, use soap on your incision.  Avoid baths or soaking the incision in the first 6 weeks following your surgery..   Driving restriction   Complete by: As directed    Avoid driving for at least 2 weeks or while taking prescription narcotics.   Lifting restrictions   Complete by: As directed    Weight restriction of 10lbs for 6 weeks.     Allergies as of 08/24/2019      Reactions   Citalopram Nausea And Vomiting   Ivp Dye [iodinated Diagnostic Agents] Hives, Swelling   hives   Meloxicam    unknown   Adhesive [tape] Rash   Amlodipine Rash   Ampicillin Rash   Did it involve swelling of the face/tongue/throat, SOB, or low BP? No Did it involve sudden or severe rash/hives, skin peeling, or any reaction on the inside of your mouth or nose? Yes Did you need to seek medical attention at a hospital or doctor's office? Yes When did it last happen? 10 + years If all above answers are "NO", may proceed with cephalosporin use.   Cefoxitin Rash   Estradiol Rash   Estropipate Rash   Olmesartan Medoxomil-hctz Rash   Sertraline Rash      Sulfa Antibiotics Rash      Medication List     STOP taking these medications   acetaminophen 500 MG tablet Commonly known as: TYLENOL   fluconazole 150 MG tablet Commonly known as: Diflucan     TAKE these medications   amitriptyline 25 MG tablet Commonly known as: ELAVIL TAKE 1 TABLET BY MOUTH EVERYDAY AT BEDTIME What changed: See  the new instructions.   benzocaine-resorcinol 5-2 % vaginal cream Commonly known as: VAGISIL Place 1 application vaginally daily as needed for itching or irritation.   cetirizine 10 MG tablet Commonly known as: ZYRTEC Take 10 mg by mouth at bedtime.   diphenhydrAMINE 25 MG tablet Commonly known as: BENADRYL Take 25 mg by mouth daily as needed for itching or allergies.   docusate sodium 100 MG capsule Commonly known as: COLACE Take 1 capsule (100 mg total) by mouth 2 (two) times daily.   enoxaparin 40 MG/0.4ML injection Commonly known as: LOVENOX Inject 0.4 mLs (40 mg total) into the skin daily.   fluticasone 50 MCG/ACT nasal spray Commonly known as: FLONASE Place 2 sprays into both nostrils daily. What changed: when to take this   guaiFENesin 600 MG 12 hr tablet Commonly known as: MUCINEX Take 600 mg by mouth at bedtime as needed (congestion).   hydrochlorothiazide 25 MG tablet Commonly known as: HYDRODIURIL Take 1 tablet (25 mg total) by mouth daily.   hydrocortisone cream 1 % Apply 1 application topically daily as needed for itching.   ibuprofen 600 MG tablet Commonly known as: ADVIL Take 1 tablet (600 mg total) by mouth every 6 (six) hours as needed for fever or headache.   omeprazole 20 MG capsule Commonly known as: PRILOSEC Take 20 mg by mouth daily as needed (acid reflux).   oxyCODONE-acetaminophen 5-325 MG tablet Commonly known as: PERCOCET/ROXICET Take 1-2 tablets by mouth every 4 (four) hours as needed for moderate pain.            Discharge Care Instructions  (From admission, onward)         Start     Ordered   08/24/19 0000  Discharge wound care:     Comments: You may apply a light dressing for minor discharge from the incision or to keep waistbands of clothing from rubbing.  You may also have been discharge with a clear dressing in which case this will be removed at your postoperative clinic visit.  You may shower, use soap on your incision.  Avoid baths or soaking the incision in the first 6 weeks following your surgery.Marland Kitchen   08/24/19 0754         Follow-up Information    [name], MD Follow up on 08/31/2019.   Specialty: Obstetrics and Gynecology Why: wound check staple removal Contact information: [address] [place] [address] [phone]           Signed: [name] 08/24/2019, 7:55 AM

## 2019-08-24 NOTE — Discharge Instructions (Signed)
  COMPLAINT                                        MEDICINE                                                 Constipation Add fiber supplements such as Metamucil, Fibercon, or Citrucel.  Increasing fiber intake via bran cereals, oatmeal, leafy greens, and prunes is another alternative.  Increase your fluid intake.  You may also add Colace 100mg  by mouth twice daily or Senakot.  Fleets enema, glycerin suppositories, and Miralax are also options

## 2019-08-24 NOTE — Progress Notes (Signed)
Pt discharged home. Discharge instructions, prescriptions, and follow up appointments given to and reviewed with pt. Pt verbalized understanding. To be escorted by axillary.  

## 2019-08-26 ENCOUNTER — Telehealth: Payer: Self-pay

## 2019-08-26 NOTE — Telephone Encounter (Signed)
Please advise 

## 2019-08-26 NOTE — Telephone Encounter (Signed)
Called and notified Carrie Roberson with her post op appointment details for Dr. Fransisca Connors. Cancelled appointment that was made at [facility] with Dr. Fransisca Connors.

## 2019-08-26 NOTE — Telephone Encounter (Signed)
Sch appt AMS Thurs

## 2019-08-26 NOTE — Telephone Encounter (Signed)
Pt's niece calling; pt is very constipated; has tried everything the instructions from the hops suggested; has decreased pain medication hoping that would help; is not eating much d/t nausea; is miserable.  [phone], [phone], or [phone]

## 2019-08-26 NOTE — Telephone Encounter (Signed)
Patient is schedule for 08/27/19 with AMS at 3;30

## 2019-08-27 ENCOUNTER — Other Ambulatory Visit: Payer: Self-pay

## 2019-08-27 ENCOUNTER — Encounter: Payer: Self-pay | Admitting: Obstetrics and Gynecology

## 2019-08-27 ENCOUNTER — Ambulatory Visit (INDEPENDENT_AMBULATORY_CARE_PROVIDER_SITE_OTHER): Payer: Medicare Other | Admitting: Obstetrics and Gynecology

## 2019-08-27 VITALS — BP 125/65 | HR 88 | Wt 185.0 lb

## 2019-08-27 DIAGNOSIS — Z4889 Encounter for other specified surgical aftercare: Secondary | ICD-10-CM

## 2019-08-27 NOTE — Progress Notes (Signed)
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  Subjective: Patient reports some improvement in her preop symptoms. Eating a regular diet with difficulty, still poor apetite. well controlled on current analgesic regimen  She is passing flatus.  Has not had a bowl movement which greatly concerns her as she has had problems with constipation in the past  Objective: Blood pressure 125/65, pulse 88, weight 185 lb (83.9 kg).  General: NAD Pulmonary: no increased work of breathing Abdomen: NABS, soft, non-tender, non-distended, incision D/C/I with intact staple line Extremities: no edema Neurologic: normal gait    Admission on 08/19/2019, Discharged on 08/24/2019  Component Date Value Ref Range Status  . ABO/RH(D) 08/19/2019    Final                   Value:A POS [facility] [address], [place]  [address]  . SURGICAL PATHOLOGY 08/19/2019    Final-Edited                   Value:SURGICAL PATHOLOGY CASE: [id number] PATIENT: Carrie CourseSurgical Pathology Report     Specimen Submitted: A. Fallopian tube and ovary, right B. Fallopian tube and ovary, left C. Peritoneal stone D. Uterus with cervix E. Right gutter F. Bladder flap peritoneum G. Lymph node, right common iliac H. Lymph node, right pelvic I. Omentum J. Lymph node, right aortic  Clinical History: Right ovarian cyst N83.201      DIAGNOSIS: A.  FALLOPIAN TUBE AND OVARY, RIGHT; RIGHT SALPINGO-OOPHORECTOMY: - HIGH-GRADE SEROUS CARCINOMA OF THE OVARY. - ANGIOLYMPHATIC INVASION IS PRESENT. - SEE CANCER SUMMARY BELOW. - FALLOPIAN TUBE WITH BENIGN PARATUBAL CYSTS.  B.  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 - FALLOPIAN TUBE WITH BENIGN PARATUBAL CYSTS. - NEGATIVE FOR ATYPIA AND MALIGNANCY.  C.  PERITONEAL STONE; EXCISION: - STONE, 1.7 CM (GROSS ONLY).  D. UTERUS WITH CERVIX; TOTA                         L HYSTERECTOMY: - CERVIX WITH ENDOCERVICAL POLYP MEASURING 0.5 CM, NABOTHIAN CYSTS, AND TUNNEL CLUSTERS. - INACTIVE ENDOMETRIUM WITH SMALL ENDOMETRIAL POLYP. 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 - NEGATIVE FOR ATYPIA AND MALIGNANCY.  E.  RIGHT GUTTER; BIOPSY: - BENIGN MESOTHELIAL LINED FIBROADIPOSE TISSUE. - NEGATIVE FOR MALIGNANCY.  F.  BLADDER FLAP PERITONEUM; BIOPSY: - BENIGN FIBROADIPOSE TISSUE. - NEGATIVE FOR MALIGNANCY.  G.  LYMPH NODE, RIGHT COMMON ILIAC; EXCISION: - THREE LYMPH NODES NEGATIVE FOR MALIGNANCY (0/3).  H.  LYMPH NODE, RIGHT PELVIC; EXCISION: - ONE LYMPH NODE NEGATIVE FOR MALIGNANCY (0/1).  I.  OMENTUM; OMENTECTOMY: - BENIGN FIBROADIPOSE TISSUE. - NEGATIVE FOR MALIGNANCY.  J.  LYMPH NODE, RIGHT AORTIC; EXCISION: - ONE LYMPH NODE NEGATIVE FOR MALIGNANCY (0/1).  See concurrent case [id number].  CANCER CASE SUMMARY: OVARY or FALLOPIAN TUBE or PRIMARY PERITONEUM Procedure: Total hysterectomy, bilateral sal                         pingo-oophorectomy, omentectomy, peritoneal biopsies, and lymph node sampling Specimen Integrity: Capsule intact Tumor Site: Right ovary Ovarian Surface Involvement: Present, right ovary Fallopian Tube Surface Involvement: Absent Tumor Size: Greatest  dimension 2.5 cm Histologic Type: Serous carcinoma Histologic Grade: High-grade Implants: Not applicable Other Tissue/ Organ Involvement: Not identified Peritoneal/Ascitic Fluid: Negative for Malignancy ([id number]) Treatment Effect: No known presurgical therapy Regional Lymph Nodes: All lymph nodes negative for tumor cells      Number of Lymph Nodes Examined: 5 Pathologic Stage Classification (pTNM, AJCC 8th Edition): pT1c2 pN0 / FIGO  1C2  Comment: A limited panel of immunohistochemical stains was performed.  The high-grade ovarian carcinoma is positive for PAX 8 and WT-1.  p53 demonstrates an aberrant pattern of immunoreactivity with complete lack of nuclear staining in the carcinoma (null phenotype). This pattern of                          immunoreactivity in conjunction with the morphologic findings supports the diagnosis of high-grade serous carcinoma of the ovary.  There is sufficient material for ancillary molecular testing.  Preliminary findings were communicated to Dr. Georgianne Fick on 08/20/2019 via [facility] secure text.  IHC slides were prepared by [facility], [place], [place]. All controls stained appropriately.  This test was developed and its performance characteristics determined by [facility]. It has not been cleared or approved by the [place] Food and Drug Administration. The FDA does not require this test to go through premarket FDA review. This test is used for clinical purposes. It should not be regarded as investigational or for research. This laboratory is certified under the Clinical Laboratory Improvement Amendments (CLIA) as qualified to perform high complexity clinical laboratory testing.  GROSS DESCRIPTION: A.  Intraoperative Consultation:     Labeled: Right fall                         opian tube and ovary     Received: Fresh at 8:33 AM on 08/19/2019     Specimen: Right side fallopian tube and ovarian mass     Pathologic evaluation performed: 1 frozen section and 2 slides     Diagnosis: Right ovary: Representative section solid area. High-grade carcinoma favor Mullerian origin     Communicated to: Dr. Georgianne Fick by Dr. Reuel Derby at 8:48 AM on 08/19/2019     Tissue submitted: Cassette A1-frozen section  Fallopian tube:    Integrity: Intact, complete with fimbriated end    Size: 2.5 cm in length and 0.6 cm in diameter    Serosa: Pink-tan smooth and shiny.  There is a single  peritubular cyst 0.8 cm in greatest dimension which contains clear fluid    On sections: Grossly unremarkable lumen present    Perforation: Not present Ovary: Integrity: Intact Size: 2.5 x 2.0 x 1.5 cm Weight: 17 g (after opening cyst) Outer surface: Smooth and shiny On sectioning: White solid cut surface. Additional abnormalities: The ovary adjacent with large cyst measuring 7 x 5                          x 5 cm with smooth and shiny outer and inner surfaces.  Cystic wall is 0.1 cm in thickness.  Cyst contain clear serous fluid. Additional tissue: Not present Summary of Sections: 1-frozen section 2-entirely submitted fallopian tube 3-10-multiple representative 11-cyst wall  B. Labeled: Left fallopian tube and ovary. Received: In formalin Fallopian tube:    Integrity: Intact, complete with fimbriated end    Size: 3.0 cm in length and 0.7 cm in diameter    Serosa: Pink-tan smooth  and shiny.  There is a single peritubular cyst measuring up to 1.0 cm in greatest dimension which contain clear fluid    On sections: Grossly unremarkable lumen present    Perforation: Not present Ovary: Integrity: Intact Size: 2.0 x 1.3 x 1.0 cm Weight: 2.5 g Outer surface: Partially granular On sectioning: Nodular cut surface with single empty cyst measuring up to 1.5 cm in greatest dimension Additional abnormalities: Not identified Additional tissue: Not present Summary of Sections:                          1-6-entirely submitted specimen   C. Labeled: Peritoneal stone Received: In formalin Tissue fragment(s): 1 Size: 1.7 x 1.4 x 1.2 cm Description: [employer] Shark firm stone Specimen is photographed The specimen is for gross description only.  D. Labeled: Uterus with cervix Received: In formalin Weight: 40 grams Dimensions:      Fundus -4.5 x 3.5 x 3.3 cm      Cervix -2.0 x 1.8 cm Serosa: Pink smooth and shiny Cervix: Slightly eroded and focally hemorrhagic Endocervix: Endocervical canal  is dilated up to 0.7 cm in diameter and contains white mucoid material.  Next to the cervical os there is a soft polypoid lesion measuring 0.5 x 0.4 x 0.3 cm. Endometrial cavity:      Dimensions -2.0 cm in length and 1.7 cm in transverse      Thickness -less than 0.1 cm      Other findings -endometrial surface is slightly hemorrhagic covered by mucoid material Myometrium:     Thickness -1.2 cm in maximal thickness     Other findings -serial sections demonstrate fibrotic cut surface w                         ith single fibrotic nodule measuring 0.7 cm in greatest dimension  Block summary: 1-anterior cervix 2-posterior cervix with endocervical polypoid lesion 3-anterior lower uterine segment (green ink indicates cervical site) 4-posterior lower uterine segment (green ink indicates cervical site) 5-6-entirely submitted anterior endometrium 7-9-entirely submitted posterior endometrium 10-right parametrium 11-left parametrium  E. Labeled: Right gutter Received: In formalin Tissue fragment(s): 1 Size: 3.5 x 3.0 x 0.8 cm Description: Irregular unoriented entire fatty tissue fragment partially covered by smooth shiny capsule.  There is no visible or palpable gross abnormality. Entirely submitted in 5 cassettes.  F. Labeled: Bladder flap peritoneal Received: In formalin Tissue fragment(s): 1 Size: 1.0 x 0.8 x 0.3 cm Description: Unoriented fibrofatty soft tissue fragment Entirely submitted in 1 cassette.  G labeled: Right common iliac Received: In formalin Tissue frag                         ment(s): 3 Size: Ranging from 0.8-1.2 cm in greatest dimension Description: three candidates for lymph nodes Entirely submitted in 1 cassette.  H. Labeled: Right pelvic lymph node Received: In formalin Size: 1.2 x 1.0 x 0.4 cm Description: Single rubbery soft lymph node surrounded by fatty tissue Entirely submitted in 1 cassette bisected lymph node.  I. Labeled: Omentum Received: In  formalin Tissue fragment(s): 2 Size: 20 x 20 x 1.5 cm in aggregate Description: Omental tissue with focal areas of hemorrhage.  Serial sections demonstrate highly vascular lobulated soft cut surface without visible or palpable abnormalities Representatively submitted in 6 cassettes.  J. Labeled: Right aortic lymph node Received: In formalin Tissue fragment(s): 1 Size: 0.8 x 0.4 x 0.4 cm  Description: Single soft lymph node Entirely submitted in 1 cassette bisected lymph node.    Final Diagnosis performed by Quay Burow, MD.   Electronically signed 08/24/2019 3:25:51PM The electr                         onic signature indicates that the named Attending Pathologist has evaluated the specimen Technical component performed at [facility], [address] [place], [place] [address] Lab: [phone] Dir: Rush Farmer, MD, MMM  Professional component performed at [facility], [facility], [place], [place], [place] [address] Lab: [phone] Dir: Dellia Nims. Rubinas, MD   . CYTOLOGY - NON GYN 08/19/2019    Final-Edited                   Value:CYTOLOGY - NON PAP CASE: [id number] PATIENT: Carrie Roberson Non-Gynecological Cytology Report     Specimen Submitted: A. Pelvic washings  Clinical History: Right ovarian cyst N83.201      DIAGNOSIS: A. PELVIC WASHINGS: - NEGATIVE FOR MALIGNANCY. - ERYTHROCYTES AND REACTIVE MESOTHELIAL CELLS.  See concurrent case [id number].  GROSS DESCRIPTION: A. Labeled: Pelvic washings Received: Fresh Volume: 50 mL Description of fluid and container in which it is received: Hemorrhagic fluid Cytospin slide(s) received: Yes #1  Specimen material submitted for: Cell block and ThinPrep   Final Diagnosis performed by Quay Burow, MD.   Electronically signed 08/24/2019 3:12:31PM The electronic signature indicates that the named Attending Pathologist has evaluated the specimen Technical component performed at [facility], [address], [place], [place] [address] Lab: [phone] Dir: Rush Farmer, MD, MMM  Professional component performed at [facility], [facility], [place], [place], [place] [address] Lab: [phone] Dir: Dellia Nims. Rubinas, MD   . WBC 08/20/2019 10.3  4.0 - 10.5 K/uL Final  . RBC 08/20/2019 3.91  3.87 - 5.11 MIL/uL Final  . Hemoglobin 08/20/2019 10.9* 12.0 - 15.0 g/dL Final  . HCT 08/20/2019 33.3* 36.0 - 46.0 % Final  . MCV 08/20/2019 85.2  80.0 - 100.0 fL Final  . MCH 08/20/2019 27.9  26.0 - 34.0 pg Final  . MCHC 08/20/2019 32.7  30.0 - 36.0 g/dL Final  . RDW 08/20/2019 14.8  11.5 - 15.5 % Final  . Platelets 08/20/2019 236  150 - 400 K/uL Final  . nRBC 08/20/2019 0.0  0.0 - 0.2 % Final   Performed at [facility], [address], [place], [place] [address]  . Sodium 08/20/2019 133* 135 - 145 mmol/L Final  . Potassium 08/20/2019 3.8  3.5 - 5.1 mmol/L Final  . Chloride 08/20/2019 102  98 - 111 mmol/L Final  . CO2 08/20/2019 22  22 - 32 mmol/L Final  . Glucose, Bld 08/20/2019 148* 70 - 99 mg/dL Final  . BUN 08/20/2019 9  8 - 23 mg/dL Final  . Creatinine, Ser 08/20/2019 0.66  0.44 - 1.00 mg/dL Final  . Calcium 08/20/2019 9.2  8.9 - 10.3 mg/dL Final  . GFR calc non Af Amer 08/20/2019 >60  >60 mL/min Final  . GFR calc Af Amer 08/20/2019 >60  >60 mL/min Final  . Anion gap 08/20/2019 9  5 - 15 Final   Performed at [facility], [address], [place],  [address]  . Sodium 08/22/2019 137  135 - 145 mmol/L Final  . Potassium  08/22/2019 4.0  3.5 - 5.1 mmol/L Final  . Chloride 08/22/2019 105  98 - 111 mmol/L Final  . CO2 08/22/2019 23  22 - 32 mmol/L Final  . Glucose, Bld 08/22/2019 92  70 - 99 mg/dL Final  . BUN 08/22/2019 7* 8 - 23 mg/dL Final  . Creatinine, Ser 08/22/2019 0.50  0.44 - 1.00 mg/dL Final  . Calcium 08/22/2019 9.5  8.9 - 10.3 mg/dL Final  . Total Protein 08/22/2019 6.1* 6.5 - 8.1 g/dL Final  . Albumin  08/22/2019 3.0* 3.5 - 5.0 g/dL Final  . AST 08/22/2019 59* 15 - 41 U/L Final  . ALT 08/22/2019 140* 0 - 44 U/L Final  . Alkaline Phosphatase 08/22/2019 141* 38 - 126 U/L Final  . Total Bilirubin 08/22/2019 1.1  0.3 - 1.2 mg/dL Final  . GFR calc non Af Amer 08/22/2019 >60  >60 mL/min Final  . GFR calc Af Amer 08/22/2019 >60  >60 mL/min Final  . Anion gap 08/22/2019 9  5 - 15 Final   Performed at [facility], [address], [place], [place] [address]  . WBC 08/22/2019 5.0  4.0 - 10.5 K/uL Final  . RBC 08/22/2019 3.78* 3.87 - 5.11 MIL/uL Final  . Hemoglobin 08/22/2019 10.6* 12.0 - 15.0 g/dL Final  . HCT 08/22/2019 32.7* 36.0 - 46.0 % Final  . MCV 08/22/2019 86.5  80.0 - 100.0 fL Final  . MCH 08/22/2019 28.0  26.0 - 34.0 pg Final  . MCHC 08/22/2019 32.4  30.0 - 36.0 g/dL Final  . RDW 08/22/2019 14.6  11.5 - 15.5 % Final  . Platelets 08/22/2019 227  150 - 400 K/uL Final  . nRBC 08/22/2019 0.0  0.0 - 0.2 % Final   Performed at [facility], [address] [place], [place] [address]  . Magnesium 08/22/2019 1.8  1.7 - 2.4 mg/dL Final   Performed at [facility], [address] [place], [place] [address]    Assessment: 74 y.o. s/p TAH, BSO, omentectomy, right pelvic and paraaortic lymph node disection stable  Plan: Patient has done well after surgery with no apparent complications.  I have discussed the post-operative Roberson to date, and the expected progress moving forward.  The patient understands what complications to be concerned about.  I will see the patient in routine follow up, or sooner if needed.    Activity plan: No heavy lifting.  Return in about 4 days (around 08/31/2019) for 2/8 or 2/9 for staple removal.    [name], MD, [place], [place]

## 2019-08-28 ENCOUNTER — Other Ambulatory Visit: Payer: Self-pay

## 2019-08-31 ENCOUNTER — Encounter: Payer: Self-pay | Admitting: Obstetrics and Gynecology

## 2019-08-31 ENCOUNTER — Ambulatory Visit: Payer: Medicare Other | Admitting: Obstetrics and Gynecology

## 2019-08-31 ENCOUNTER — Other Ambulatory Visit: Payer: Self-pay

## 2019-08-31 ENCOUNTER — Ambulatory Visit (INDEPENDENT_AMBULATORY_CARE_PROVIDER_SITE_OTHER): Payer: Medicare Other | Admitting: Obstetrics and Gynecology

## 2019-08-31 VITALS — BP 108/58 | HR 68 | Wt 183.0 lb

## 2019-08-31 DIAGNOSIS — Z48816 Encounter for surgical aftercare following surgery on the genitourinary system: Secondary | ICD-10-CM

## 2019-08-31 DIAGNOSIS — C561 Malignant neoplasm of right ovary: Secondary | ICD-10-CM

## 2019-08-31 DIAGNOSIS — D251 Intramural leiomyoma of uterus: Secondary | ICD-10-CM

## 2019-08-31 DIAGNOSIS — Z4889 Encounter for other specified surgical aftercare: Secondary | ICD-10-CM

## 2019-08-31 NOTE — Progress Notes (Addendum)
Postoperative Follow-up Patient presents post op from TAH, BSO, omentectomy, right paraaortic and pelvic lymph node dissection 2weeks ago for ovarian cancer.  Subjective: Patient reports some improvement in her preop symptoms. Eating a regular diet without difficulty. Pain is controlled without any medications.  Activity: normal activities of daily living.  Still has not had a bowl movement  Objective: Blood pressure (!) 108/58, pulse 68, weight 183 lb (83 kg).  General: NAD Pulmonary: no increased work of breathing Abdomen: soft, non-tender, non-distended, incision D/C/I.  Staples removed, reinforced with steri strips Extremities: no edema Neurologic: normal gait    Admission on 08/19/2019, Discharged on 08/24/2019  Component Date Value Ref Range Status  . ABO/RH(D) 08/19/2019    Final                   Value:A POS Performed at [facility], [address], [place], [place] [address]   . SURGICAL PATHOLOGY 08/19/2019    Final-Edited                   Value:SURGICAL PATHOLOGY CASE: [id number] PATIENT: [name] Surgical Pathology Report     Specimen Submitted: A. Fallopian tube and ovary, right B. Fallopian tube and ovary, left C. Peritoneal stone D. Uterus with cervix E. Right gutter F. Bladder flap peritoneum G. Lymph node, right common iliac H. Lymph node, right pelvic I. Omentum J. Lymph node, right aortic  Clinical History: Right ovarian cyst N83.201      DIAGNOSIS: A.  FALLOPIAN TUBE AND OVARY, RIGHT; RIGHT SALPINGO-OOPHORECTOMY: - HIGH-GRADE SEROUS CARCINOMA OF THE OVARY. - ANGIOLYMPHATIC INVASION IS PRESENT. - SEE CANCER SUMMARY BELOW. - FALLOPIAN TUBE WITH BENIGN PARATUBAL CYSTS.  B.  FALLOPIAN TUBE AND OVARY, LEFT; LEFT SALPINGO-OOPHORECTOMY: - OVARY WITH SEROUS CYSTADENOMA MEASURING 1.5 CM AND CYSTIC FOLLICLES. - FALLOPIAN TUBE WITH BENIGN PARATUBAL CYSTS. - NEGATIVE FOR ATYPIA AND MALIGNANCY.  C.  PERITONEAL STONE;  EXCISION: - STONE, 1.7 CM (GROSS ONLY).  D. UTERUS WITH CERVIX; TOTA                         L HYSTERECTOMY: - CERVIX WITH ENDOCERVICAL POLYP MEASURING 0.5 CM, NABOTHIAN CYSTS, AND TUNNEL CLUSTERS. - INACTIVE ENDOMETRIUM WITH SMALL ENDOMETRIAL POLYP. 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 - NEGATIVE FOR ATYPIA AND MALIGNANCY.  E.  RIGHT GUTTER; BIOPSY: - BENIGN MESOTHELIAL LINED FIBROADIPOSE TISSUE. - NEGATIVE FOR MALIGNANCY.  F.  BLADDER FLAP PERITONEUM; BIOPSY: - BENIGN FIBROADIPOSE TISSUE. - NEGATIVE FOR MALIGNANCY.  G.  LYMPH NODE, RIGHT COMMON ILIAC; EXCISION: - THREE LYMPH NODES NEGATIVE FOR MALIGNANCY (0/3).  H.  LYMPH NODE, RIGHT PELVIC; EXCISION: - ONE LYMPH NODE NEGATIVE FOR MALIGNANCY (0/1).  I.  OMENTUM; OMENTECTOMY: - BENIGN FIBROADIPOSE TISSUE. - NEGATIVE FOR MALIGNANCY.  J.  LYMPH NODE, RIGHT AORTIC; EXCISION: - ONE LYMPH NODE NEGATIVE FOR MALIGNANCY (0/1).  See concurrent case [id number].  CANCER CASE SUMMARY: OVARY or FALLOPIAN TUBE or PRIMARY PERITONEUM Procedure: Total hysterectomy, bilateral sal                         pingo-oophorectomy, omentectomy, peritoneal biopsies, and lymph node sampling Specimen Integrity: Capsule intact Tumor Site: Right ovary Ovarian Surface Involvement: Present, right ovary Fallopian Tube Surface Involvement: Absent Tumor Size: Greatest dimension 2.5 cm Histologic Type: Serous carcinoma Histologic Grade: High-grade Implants: Not applicable Other Tissue/ Organ Involvement: Not identified Peritoneal/Ascitic Fluid: Negative for  Malignancy ([id number]) Treatment Effect: No known presurgical therapy Regional Lymph Nodes: All lymph nodes negative for tumor cells      Number of Lymph Nodes Examined: 5 Pathologic Stage Classification (pTNM, AJCC 8th Edition): pT1c2 pN0 / FIGO 1C2  Comment: A limited panel of immunohistochemical stains was performed.  The high-grade ovarian carcinoma is  positive for PAX 8 and WT-1.  p53 demonstrates an aberrant pattern of immunoreactivity with complete lack of nuclear staining in the carcinoma (null phenotype). This pattern of                          immunoreactivity in conjunction with the morphologic findings supports the diagnosis of high-grade serous carcinoma of the ovary.  There is sufficient material for ancillary molecular testing.  Preliminary findings were communicated to Dr. Georgianne Fick on 08/20/2019 via [facility] secure text.  IHC slides were prepared by [facility], [place], [place]. All controls stained appropriately.  This test was developed and its performance characteristics determined by [facility]. It has not been cleared or approved by the [place] Food and Drug Administration. The FDA does not require this test to go through premarket FDA review. This test is used for clinical purposes. It should not be regarded as investigational or for research. This laboratory is certified under the Clinical Laboratory Improvement Amendments (CLIA) as qualified to perform high complexity clinical laboratory testing.  GROSS DESCRIPTION: A.  Intraoperative Consultation:     Labeled: Right fall                         opian tube and ovary     Received: Fresh at 8:33 AM on 08/19/2019     Specimen: Right side fallopian tube and ovarian mass     Pathologic evaluation performed: 1 frozen section and 2 slides     Diagnosis: Right ovary: Representative section solid area. High-grade carcinoma favor Mullerian origin     Communicated to: Dr. Georgianne Fick by Dr. Reuel Derby at 8:48 AM on 08/19/2019     Tissue submitted: Cassette A1-frozen section  Fallopian tube:    Integrity: Intact, complete with fimbriated end    Size: 2.5 cm in length and 0.6 cm in diameter    Serosa: Pink-tan smooth and shiny.  There is a single peritubular cyst 0.8 cm in greatest dimension which contains clear fluid    On sections: Grossly unremarkable  lumen present    Perforation: Not present Ovary: Integrity: Intact Size: 2.5 x 2.0 x 1.5 cm Weight: 17 g (after opening cyst) Outer surface: Smooth and shiny On sectioning: White solid cut surface. Additional abnormalities: The ovary adjacent with large cyst measuring 7 x 5                          x 5 cm with smooth and shiny outer and inner surfaces.  Cystic wall is 0.1 cm in thickness.  Cyst contain clear serous fluid. Additional tissue: Not present Summary of Sections: 1-frozen section 2-entirely submitted fallopian tube 3-10-multiple representative 11-cyst wall  B. Labeled: Left fallopian tube and ovary. Received: In formalin Fallopian tube:    Integrity: Intact, complete with fimbriated end    Size: 3.0 cm in length and 0.7 cm in diameter    Serosa: Pink-tan smooth and shiny.  There is a single peritubular cyst measuring up to 1.0 cm in greatest dimension which contain clear fluid  On sections: Grossly unremarkable lumen present    Perforation: Not present Ovary: Integrity: Intact Size: 2.0 x 1.3 x 1.0 cm Weight: 2.5 g Outer surface: Partially granular On sectioning: Nodular cut surface with single empty cyst measuring up to 1.5 cm in greatest dimension Additional abnormalities: Not identified Additional tissue: Not present Summary of Sections:                          1-6-entirely submitted specimen   C. Labeled: Peritoneal stone Received: In formalin Tissue fragment(s): 1 Size: 1.7 x 1.4 x 1.2 cm Description: [employer] Shark firm stone Specimen is photographed The specimen is for gross description only.  D. Labeled: Uterus with cervix Received: In formalin Weight: 40 grams Dimensions:      Fundus -4.5 x 3.5 x 3.3 cm      Cervix -2.0 x 1.8 cm Serosa: Pink smooth and shiny Cervix: Slightly eroded and focally hemorrhagic Endocervix: Endocervical canal is dilated up to 0.7 cm in diameter and contains white mucoid material.  Next to the cervical os there is a  soft polypoid lesion measuring 0.5 x 0.4 x 0.3 cm. Endometrial cavity:      Dimensions -2.0 cm in length and 1.7 cm in transverse      Thickness -less than 0.1 cm      Other findings -endometrial surface is slightly hemorrhagic covered by mucoid material Myometrium:     Thickness -1.2 cm in maximal thickness     Other findings -serial sections demonstrate fibrotic cut surface w                         ith single fibrotic nodule measuring 0.7 cm in greatest dimension  Block summary: 1-anterior cervix 2-posterior cervix with endocervical polypoid lesion 3-anterior lower uterine segment (green ink indicates cervical site) 4-posterior lower uterine segment (green ink indicates cervical site) 5-6-entirely submitted anterior endometrium 7-9-entirely submitted posterior endometrium 10-right parametrium 11-left parametrium  E. Labeled: Right gutter Received: In formalin Tissue fragment(s): 1 Size: 3.5 x 3.0 x 0.8 cm Description: Irregular unoriented entire fatty tissue fragment partially covered by smooth shiny capsule.  There is no visible or palpable gross abnormality. Entirely submitted in 5 cassettes.  F. Labeled: Bladder flap peritoneal Received: In formalin Tissue fragment(s): 1 Size: 1.0 x 0.8 x 0.3 cm Description: Unoriented fibrofatty soft tissue fragment Entirely submitted in 1 cassette.  G labeled: Right common iliac Received: In formalin Tissue frag                         ment(s): 3 Size: Ranging from 0.8-1.2 cm in greatest dimension Description: three candidates for lymph nodes Entirely submitted in 1 cassette.  H. Labeled: Right pelvic lymph node Received: In formalin Size: 1.2 x 1.0 x 0.4 cm Description: Single rubbery soft lymph node surrounded by fatty tissue Entirely submitted in 1 cassette bisected lymph node.  I. Labeled: Omentum Received: In formalin Tissue fragment(s): 2 Size: 20 x 20 x 1.5 cm in aggregate Description: Omental tissue with focal  areas of hemorrhage.  Serial sections demonstrate highly vascular lobulated soft cut surface without visible or palpable abnormalities Representatively submitted in 6 cassettes.  J. Labeled: Right aortic lymph node Received: In formalin Tissue fragment(s): 1 Size: 0.8 x 0.4 x 0.4 cm Description: Single soft lymph node Entirely submitted in 1 cassette bisected lymph node.    Final Diagnosis performed by Quay Burow, MD.  Electronically signed 08/24/2019 3:25:51PM The electr                         onic signature indicates that the named Attending Pathologist has evaluated the specimen Technical component performed at [facility], [address], [place], [place] [address] Lab: [phone] Dir: Rush Farmer, MD, MMM  Professional component performed at [facility], [facility], [place], [place], [place] [address] Lab: [phone] Dir: Dellia Nims. Rubinas, MD   . CYTOLOGY - NON GYN 08/19/2019    Final-Edited                   Value:CYTOLOGY - NON PAP CASE: [id number] PATIENT: [name] Non-Gynecological Cytology Report     Specimen Submitted: A. Pelvic washings  Clinical History: Right ovarian cyst N83.201      DIAGNOSIS: A. PELVIC WASHINGS: - NEGATIVE FOR MALIGNANCY. - ERYTHROCYTES AND REACTIVE MESOTHELIAL CELLS.  See concurrent case [id number].  GROSS DESCRIPTION: A. Labeled: Pelvic washings Received: Fresh Volume: 50 mL Description of fluid and container in which it is received: Hemorrhagic fluid Cytospin slide(s) received: Yes #1  Specimen material submitted for: Cell block and ThinPrep   Final Diagnosis performed by Quay Burow, MD.   Electronically signed 08/24/2019 3:12:31PM The electronic signature indicates that the named Attending Pathologist has evaluated the specimen Technical component performed at [facility], [address], [place], [place] [address] Lab: [phone] Dir: Rush Farmer, MD, MMM  Professional component  performed at [facility], [facility], [place], [place], [place] [address] Lab: [phone] Dir: Dellia Nims. Rubinas, MD   . WBC 08/20/2019 10.3  4.0 - 10.5 K/uL Final  . RBC 08/20/2019 3.91  3.87 - 5.11 MIL/uL Final  . Hemoglobin 08/20/2019 10.9* 12.0 - 15.0 g/dL Final  . HCT 08/20/2019 33.3* 36.0 - 46.0 % Final  . MCV 08/20/2019 85.2  80.0 - 100.0 fL Final  . MCH 08/20/2019 27.9  26.0 - 34.0 pg Final  . MCHC 08/20/2019 32.7  30.0 - 36.0 g/dL Final  . RDW 08/20/2019 14.8  11.5 - 15.5 % Final  . Platelets 08/20/2019 236  150 - 400 K/uL Final  . nRBC 08/20/2019 0.0  0.0 - 0.2 % Final   Performed at [facility], [address], [place], [place] [address]  . Sodium 08/20/2019 133* 135 - 145 mmol/L Final  . Potassium 08/20/2019 3.8  3.5 - 5.1 mmol/L Final  . Chloride 08/20/2019 102  98 - 111 mmol/L Final  . CO2 08/20/2019 22  22 - 32 mmol/L Final  . Glucose, Bld 08/20/2019 148* 70 - 99 mg/dL Final  . BUN 08/20/2019 9  8 - 23 mg/dL Final  . Creatinine, Ser 08/20/2019 0.66  0.44 - 1.00 mg/dL Final  . Calcium 08/20/2019 9.2  8.9 - 10.3 mg/dL Final  . GFR calc non Af Amer 08/20/2019 >60  >60 mL/min Final  . GFR calc Af Amer 08/20/2019 >60  >60 mL/min Final  . Anion gap 08/20/2019 9  5 - 15 Final   Performed at [facility], [address], [place], [place] [address]  . Sodium 08/22/2019 137  135 - 145 mmol/L Final  . Potassium 08/22/2019 4.0  3.5 - 5.1 mmol/L Final  . Chloride 08/22/2019 105  98 - 111 mmol/L Final  . CO2 08/22/2019 23  22 - 32 mmol/L Final  . Glucose, Bld 08/22/2019 92  70 - 99 mg/dL Final  . BUN 08/22/2019 7* 8 - 23 mg/dL Final  . Creatinine, Ser 08/22/2019 0.50  0.44 - 1.00 mg/dL Final  . Calcium 08/22/2019 9.5  8.9 - 10.3 mg/dL Final  . Total Protein 08/22/2019 6.1* 6.5 - 8.1 g/dL Final  . Albumin 08/22/2019 3.0* 3.5 - 5.0 g/dL Final  . AST 08/22/2019 59* 15 - 41 U/L Final  . ALT 08/22/2019 140* 0 -  44 U/L Final  . Alkaline Phosphatase 08/22/2019 141* 38 - 126 U/L Final  . Total Bilirubin 08/22/2019 1.1  0.3 - 1.2 mg/dL Final  . GFR calc non Af Amer 08/22/2019 >60  >60 mL/min Final  . GFR calc Af Amer 08/22/2019 >60  >60 mL/min Final  . Anion gap 08/22/2019 9  5 - 15 Final   Performed at [facility], [address], [place], [place] [address]  . WBC 08/22/2019 5.0  4.0 - 10.5 K/uL Final  . RBC 08/22/2019 3.78* 3.87 - 5.11 MIL/uL Final  . Hemoglobin 08/22/2019 10.6* 12.0 - 15.0 g/dL Final  . HCT 08/22/2019 32.7* 36.0 - 46.0 % Final  . MCV 08/22/2019 86.5  80.0 - 100.0 fL Final  . MCH 08/22/2019 28.0  26.0 - 34.0 pg Final  . MCHC 08/22/2019 32.4  30.0 - 36.0 g/dL Final  . RDW 08/22/2019 14.6  11.5 - 15.5 % Final  . Platelets 08/22/2019 227  150 - 400 K/uL Final  . nRBC 08/22/2019 0.0  0.0 - 0.2 % Final   Performed at [facility], [address], [place], [place] [address]  . Magnesium 08/22/2019 1.8  1.7 - 2.4 mg/dL Final   Performed at [facility], [address] [place], [place] [address]    Assessment: 74 y.o. s/p TAH, BSO, omentectomy, right paraaortic and pelvic lymph node dissection stable  Plan: Patient has done well after surgery with no apparent complications.  I have discussed the post-operative course to date, and the expected progress moving forward.  The patient understands what complications to be concerned about.  I will see the patient in routine follow up, or sooner if needed.    Activity plan: No heavy lifting.  Constipation - has tried miralax, stool softeners, fleets.  Discussed trial of magnesium citrate.  This is a pre-existing problem for her  Continue Lovenox for full 28 day course  HTN - actually slightly hypotensive today.  Continue HCTZ 61m as significantly hypertensive preop and in postop period  Has received 2nd dose of COVID vaccine last week   AMalachy Mood MD, [facility] 08/31/2019, 1:16 PM

## 2019-09-02 ENCOUNTER — Inpatient Hospital Stay: Payer: Medicare Other | Attending: Obstetrics and Gynecology | Admitting: Obstetrics and Gynecology

## 2019-09-02 ENCOUNTER — Other Ambulatory Visit: Payer: Self-pay

## 2019-09-02 ENCOUNTER — Other Ambulatory Visit: Payer: Medicare Other

## 2019-09-02 VITALS — BP 127/52 | HR 61 | Temp 96.7°F | Resp 18 | Wt 183.5 lb

## 2019-09-02 DIAGNOSIS — Z90722 Acquired absence of ovaries, bilateral: Secondary | ICD-10-CM | POA: Insufficient documentation

## 2019-09-02 DIAGNOSIS — Z7901 Long term (current) use of anticoagulants: Secondary | ICD-10-CM | POA: Insufficient documentation

## 2019-09-02 DIAGNOSIS — D649 Anemia, unspecified: Secondary | ICD-10-CM | POA: Insufficient documentation

## 2019-09-02 DIAGNOSIS — F419 Anxiety disorder, unspecified: Secondary | ICD-10-CM | POA: Insufficient documentation

## 2019-09-02 DIAGNOSIS — M1990 Unspecified osteoarthritis, unspecified site: Secondary | ICD-10-CM | POA: Insufficient documentation

## 2019-09-02 DIAGNOSIS — I10 Essential (primary) hypertension: Secondary | ICD-10-CM | POA: Insufficient documentation

## 2019-09-02 DIAGNOSIS — Z90710 Acquired absence of both cervix and uterus: Secondary | ICD-10-CM

## 2019-09-02 DIAGNOSIS — Z79899 Other long term (current) drug therapy: Secondary | ICD-10-CM | POA: Insufficient documentation

## 2019-09-02 DIAGNOSIS — K589 Irritable bowel syndrome without diarrhea: Secondary | ICD-10-CM | POA: Insufficient documentation

## 2019-09-02 DIAGNOSIS — K219 Gastro-esophageal reflux disease without esophagitis: Secondary | ICD-10-CM | POA: Insufficient documentation

## 2019-09-02 DIAGNOSIS — Z803 Family history of malignant neoplasm of breast: Secondary | ICD-10-CM | POA: Insufficient documentation

## 2019-09-02 DIAGNOSIS — C561 Malignant neoplasm of right ovary: Secondary | ICD-10-CM

## 2019-09-02 NOTE — Progress Notes (Signed)
Pt 2 weeks post surgery, reports having some pain in right side of abdomen and front of right leg.  Reports some spotting this morning, light pink in color.,

## 2019-09-02 NOTE — Progress Notes (Signed)
Gynecologic Oncology Interval Visit   Referring Provider:  Dr. 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 G0P0 female, initially seen in consultation from Dr. Georgianne Fick, for right ovarian cyst returns to clinic s/p TAH-BSO for discussion of pathology results and treatment planning.   On 08/19/19 she under TAH-BSO with with peritoneal biopsies, pelvic/aortic lymph node dissection, pelvic washings, and omentectomy with Dr. Fransisca Connors and Dr. Georgianne Fick at [facility].  Uneventful recovery.    Pathology:  DIAGNOSIS:  A. FALLOPIAN TUBE AND OVARY, RIGHT; RIGHT SALPINGO-OOPHORECTOMY:  - HIGH-GRADE SEROUS CARCINOMA OF THE OVARY.  - ANGIOLYMPHATIC INVASION IS PRESENT.  - SEE CANCER SUMMARY BELOW.  - FALLOPIAN TUBE WITH BENIGN PARATUBAL CYSTS.   B. FALLOPIAN TUBE AND OVARY, LEFT; LEFT SALPINGO-OOPHORECTOMY:  - OVARY WITH SEROUS CYSTADENOMA MEASURING 1.5 CM AND CYSTIC FOLLICLES.  - FALLOPIAN TUBE WITH BENIGN PARATUBAL CYSTS.  - NEGATIVE FOR ATYPIA AND MALIGNANCY.   C. PERITONEAL STONE; EXCISION:  - STONE, 1.7 CM (GROSS ONLY).   D. UTERUS WITH CERVIX; TOTAL HYSTERECTOMY:  - CERVIX WITH ENDOCERVICAL POLYP MEASURING 0.5 CM, NABOTHIAN CYSTS, AND  TUNNEL CLUSTERS.  - INACTIVE ENDOMETRIUM WITH SMALL ENDOMETRIAL POLYP.  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  - NEGATIVE FOR ATYPIA AND MALIGNANCY.   E. RIGHT GUTTER; BIOPSY:  - BENIGN MESOTHELIAL LINED FIBROADIPOSE TISSUE.  - NEGATIVE FOR MALIGNANCY.   F. BLADDER FLAP PERITONEUM; BIOPSY:  - BENIGN FIBROADIPOSE TISSUE.  - NEGATIVE FOR MALIGNANCY.   G. LYMPH NODE, RIGHT COMMON ILIAC; EXCISION:  - THREE LYMPH NODES NEGATIVE FOR MALIGNANCY (0/3).   H. LYMPH NODE, RIGHT PELVIC; EXCISION:  - ONE LYMPH NODE NEGATIVE FOR MALIGNANCY (0/1).   I. OMENTUM; OMENTECTOMY:  - BENIGN FIBROADIPOSE TISSUE.  - NEGATIVE FOR MALIGNANCY.   J. LYMPH  NODE, RIGHT AORTIC; EXCISION:  - ONE LYMPH NODE NEGATIVE FOR MALIGNANCY (0/1).  DIAGNOSIS:  A. PELVIC WASHINGS:  - NEGATIVE FOR MALIGNANCY.  - ERYTHROCYTES AND REACTIVE MESOTHELIAL CELLS  Pathologic Staging: FIGO 1C2  She has seen Dr. Georgianne Fick for post-op evaluation. Post op course has been uncomplicated. She has received her second covid 19 vaccine. She received lovenox for 28 day course. Today she reports some slight bleeding this morning described as 'pink'.    Gynecologic Oncology History:   Urological concerning a midline pelvic mass.  Initial presentation was prompted by frequent urination and bladder pessure. PVR was checked and noted to be normal but bladder scan showed residual fluid collection.  TVUS was ordered and revealed a 10.2 x 9.2 x 6.3 cm midline cystic pelvic mass. Symptoms initially started in August of 2020. She had previously been followed asymptomatic 1.42 x 1.58cm simple left ovarian cyst on 11/18/2017.    Appearance on 06/26/2019 ultrasound was notable simple cyst, normal doppler flow and no free fluid. The patient endorses associated symptoms of pelvic pressure.  The patient denies associated symptoms of  early satiety, weight gain, weight loss, night sweats, vaginal bleeding and nausea.  There is not a notable family history of ovarian cancer, uterine cancer, breast cancer, or colon cancer.  06/07/2018 Pelvic US Findings:  The uterus is anteverted and measures 6.8x3x2.7cm. Echo texture is homogenous without evidence of focal masses. The Endometrium measures 4.19 mm.  Right Ovary measures 4.7 cm3. It is normal in appearance. Left Ovary measures 5.2 cm3. It has a hypoechoic cyst = 1.4x1.22mm Survey of the adnexa demonstrates no adnexal masses.  There is no free fluid in the cul de sac.  Impression: 1. Left Ovary measures 5.2 cm3. It has a hypoechoic cyst = 1.4x1.54mm (possibility of hemorrhagic cyst vs others) 2. Cervical canal appears irregular d/t  LEEP procedure  06/26/2019 Abdominal US  FINDINGS: No ascites is demonstrated. There is a cystic midline pelvic mass which lies superior to the bladder, measuring 10.2 x 9.2 x 6.3 cm.  IMPRESSION: 1. No ascites. 2. Large cystic mass in the pelvis, not demonstrated on previous ultrasounds. This is suspicious for ovarian cystic neoplasm. Further evaluation recommended with dedicated pelvic ultrasound and/or abdominopelvic CT.  07/01/2019 MRI Reproductive:  Uterus: Measures 4.3 x 2.0 by 5.9 cm. Scattered nabothian cysts identified. No mass.  Right ovary measures the 10.5 x 6.8 by 9.0 cm (volume = 340 cm^3). There is a large cystic mass arising from the right ovary which has a maximum dimension of 10.8 cm. An eccentric, enhancing mural nodule is identified measuring 2.6 x 1.2 cm, image 50/10. No internal septation.  Left ovary measures 2.9 x 2.1 by 1.8 cm. (Volume = 5.7 cm^3). This contains several small cyst which measure up to 1.4 cm.  06/29/2019 labs  Cancer Antigen (CA) 125 0.0 - 38.1 U/mL 10.3   Comment: [employer] Electrochemiluminescence Immunoassay (ECLIA)  Values obtained with different assay methods or kits cannot be  used interchangeably. Results cannot be interpreted as absolute  evidence of the presence or absence of malignant disease.   HE4 0.0 - 96.9 pmol/L 68.8   Comment: [employer] Electrochemiluminescence Immunoassay (ECLIA)  Values obtained with different assay methods or kits cannot be  used interchangeably. Results cannot be interpreted as absolute  evidence of the presence or absence of malignant disease.       Postmenopausal ROMA See below 1.21    ROMA calculator 12%  Of note prior LEEP 2002 (negative pathology per patient) last pap 07/26/2014 NILM.   She presents today for evaluation.   Problem List: Patient Active Problem List   Diagnosis Date Noted  . Ovarian mass 08/19/2019  . Status post total abdominal hysterectomy and bilateral  salpingo-oophorectomy (TAH-BSO) 08/19/2019  . 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[place])   . Cyst of ovary 07/15/2019  . Anxiety 09/02/2017  . 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 FH: diabetes mellitus 09/02/2017  . 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 Atypical nevus 09/02/2017  . IBS (irritable bowel syndrome) 09/02/2017  . Atrophic vaginitis 09/02/2017  . Chronic idiopathic urticaria 09/02/2017  . GERD (gastroesophageal reflux disease) 09/02/2017    Past Medical History:  Past Medical History:  Diagnosis Date  . Anxiety   . Arthritis   . GERD (gastroesophageal reflux disease)   . History of ischemic colitis 09/02/2017  . Hx of colonic polyp   . Hypertension    H/O-PT TAKEN OFF BY PCP AS OF 2019  . IBS (irritable bowel syndrome)   . Ovarian cyst   . Vaginal atrophy     Past Surgical History: Past Surgical History:  Procedure Laterality Date  . BREAST BIOPSY Left    needle bx/clip-neg  . CERVICAL BIOPSY  W/ LOOP ELECTRODE EXCISION    . CHOLECYSTECTOMY    . Colonoscopoy  2007, 2008, 2011  . COLONOSCOPY WITH PROPOFOL N/A 05/05/2018   Procedure: COLONOSCOPY WITH PROPOFOL;  Surgeon: Lin Landsman, MD;  Location: [facility] ENDOSCOPY;  Service: Gastroenterology;  Laterality: N/A;  . HYSTERECTOMY ABDOMINAL WITH SALPINGO-OOPHORECTOMY N/A 08/19/2019   Procedure: HYSTERECTOMY ABDOMINAL  WITH SALPINGO-OOPHORECTOMY WITH PELVIC WASHINGS PERITONEAL BIOPSIES AND PARA AORTIC LYMPH NODE DISSECTION;  Surgeon: [name], MD;  Location: [facility] ORS;  Service: Gynecology;  Laterality: N/A;  . LEEP    . OMENTECTOMY N/A 08/19/2019   Procedure: OMENTECTOMY;  Surgeon: [name], MD;  Location: [facility] ORS;  Service: Gynecology;  Laterality: N/A;  . TUBAL LIGATION  1999    Past Gynecologic History:  Menarche: 9 Last Menstrual Period: menopausal History of Abnormal pap: No Contraception: bilateral tubal ligation  OB History:  OB History   Gravida Para Term Preterm AB Living  0 0 0 0 0 0  SAB TAB Ectopic Multiple Live Births  0 0 0 0 0    Family History: Family History  Problem Relation Age of Onset  . Heart failure Mother   . Atrial fibrillation Mother   . Hypertension Mother   . Diabetes Father   . Heart attack Father   . Breast cancer Maternal Aunt 70  . Breast cancer Maternal Grandmother 80  . Breast cancer Cousin        2 mat cousins  . Hypertension Sister   . Hypertension Sister   . Arthritis Sister   . Leukemia Paternal Aunt   . Leukemia Paternal Uncle   . Brain cancer Paternal Aunt   . Prostate cancer Neg Hx   . Kidney cancer Neg Hx     Social History: Retired  Social History   Socioeconomic History  . Marital status: Single    Spouse name: Not on file  . Number of children: 0  . Years of education: Not on file  . Highest education level: Associate degree: academic program  Occupational History  . Occupation: Retired  Tobacco Use  . Smoking status: Never Smoker  . Smokeless tobacco: Never Used  . Tobacco comment: smoking cessation materials not required  Substance and Sexual Activity  . Alcohol use: Yes    Comment: rarely  . Drug use: No  . Sexual activity: Never    Birth control/protection: Post-menopausal  Other Topics Concern  . Not on file  Social History Narrative  . Not on file   Social Determinants of Health   Financial Resource Strain:   . Difficulty of Paying Living Expenses: Not on file  Food Insecurity:   . Worried About Charity fundraiser in the Last Year: Not on file  . Ran Out of Food in the Last Year: Not on file  Transportation Needs:   . Lack of Transportation (Medical): Not on file  . Lack of Transportation (Non-Medical): Not on file  Physical Activity:   . Days of Exercise per Week: Not on file  . Minutes of Exercise per Session: Not on file  Stress:   . Feeling of Stress : Not on file  Social Connections:   . Frequency of Communication with Friends and  Family: Not on file  . Frequency of Social Gatherings with Friends and Family: Not on file  . Attends Religious Services: Not on file  . Active Member of Clubs or Organizations: Not on file  . Attends Archivist Meetings: Not on file  . Marital Status: Not on file  Intimate Partner Violence:   . Fear of Current or Ex-Partner: Not on file  . Emotionally Abused: Not on file  . Physically Abused: Not on file  . Sexually Abused: Not on file    Allergies: Allergies  Allergen Reactions  . Citalopram Nausea And Vomiting  . Ivp Dye [Iodinated [employer]] Hives and Swelling    hives  . Meloxicam     unknown  . Adhesive [Tape] Rash  . Amlodipine Rash  . Ampicillin Rash    Did it involve swelling of the face/tongue/throat, SOB, or low BP? No Did it involve sudden or severe rash/hives, skin peeling, or any reaction on the inside of your mouth or nose? Yes Did you need to seek medical attention at a hospital or doctor's office? Yes When did it last happen? 10 + years If all above answers are "NO", may proceed with cephalosporin use.  . Cefoxitin Rash  . Estradiol Rash  . Estropipate Rash  . Olmesartan Medoxomil-Hctz Rash  . Sertraline Rash       . Sulfa Antibiotics Rash    Current Medications: Current Outpatient Medications  Medication Sig Dispense Refill  . benzocaine-resorcinol (VAGISIL) 5-2 % vaginal cream Place 1 application vaginally daily as needed for itching or irritation.    . cetirizine (ZYRTEC) 10 MG tablet Take 10 mg by mouth at bedtime.    . diphenhydrAMINE (BENADRYL) 25 MG tablet Take 25 mg by mouth daily as needed for itching or allergies.    Marland Kitchen docusate sodium (COLACE) 100 MG capsule Take 1 capsule (100 mg total) by mouth 2 (two) times daily. 10 capsule 0  . enoxaparin (LOVENOX) 40 MG/0.4ML injection Inject 0.4 mLs (40 mg total) into the skin daily. 8.8 mL 0  . hydrochlorothiazide (HYDRODIURIL) 25 MG tablet Take 1 tablet (25 mg total) by mouth  daily. 30 tablet 0  . hydrocortisone cream 1 % Apply 1 application topically daily as needed for itching.    Marland Kitchen ibuprofen (ADVIL) 600 MG tablet Take 1 tablet (600 mg total) by mouth every 6 (six) hours as needed for fever or headache. 30 tablet 0  . omeprazole (PRILOSEC) 20 MG capsule Take 20 mg by mouth daily as needed (acid reflux).    Marland Kitchen oxyCODONE-acetaminophen (PERCOCET/ROXICET) 5-325 MG tablet Take 1-2 tablets by mouth every 4 (four) hours as needed for moderate pain. 40 tablet 0  . amitriptyline (ELAVIL) 25 MG tablet TAKE 1 TABLET BY MOUTH EVERYDAY AT BEDTIME (Patient not taking: No sig reported) 90 tablet 0  . fluticasone (FLONASE) 50 MCG/ACT nasal spray Place 2 sprays into both nostrils daily. (Patient not taking: Reported on 09/02/2019) 16 g 0  . guaiFENesin (MUCINEX) 600 MG 12 hr tablet Take 600 mg by mouth at bedtime as needed (congestion).     No current facility-administered medications for this visit.   Review of Systems General:  no complaints Skin: no complaints Eyes: no complaints HEENT: no complaints Breasts: no complaints Pulmonary: no complaints Cardiac: no complaints Gastrointestinal: no complaints Genitourinary/Sexual: no complaints Ob/Gyn: no complaints Musculoskeletal: no complaints Hematology: no complaints Neurologic/Psych: no complaints  Objective:  Physical Examination:  BP (!) 127/52 (BP Location: Left Arm, Patient Position: Sitting)   Pulse 61   Temp (!) 96.7 F (35.9 C) (Tympanic)   Resp 18   Wt 183 lb 8 oz (83.2 kg)   BMI 32.51 kg/m    ECOG Performance Status: 1 - Symptomatic but completely ambulatory  GENERAL: Patient is a well appearing female in no acute distress HEENT:  Sclera clear. Anicteric NODES:  Negative axillary, supraclavicular, inguinal lymph node survery LUNGS:  Clear to auscultation bilaterally.   HEART:  Regular rate and rhythm.  ABDOMEN:  Soft, nontender.  No hernias, incision healing well and intact. No masses or  ascites EXTREMITIES:  No peripheral edema. Atraumatic. No cyanosis SKIN:  Clear with no obvious rashes or skin changes.  NEURO:  Nonfocal. Well oriented.  Appropriate affect.  Pelvic: exam chaperoned by nurse;  Vulva: normal appearing vulva with no masses, tenderness or lesions; Vagina: normal vagina healing well. Bimanual: normal  Lab Review Labs on site today.  Radiologic Imaging: No imaging on site today.     Assessment:  Carrie Roberson is a 74 y.o. female with stage IC2 high grade serous right ovarian cancer and had TAH, BSO and negative surgical staging including pelvic and PA node sampling and omentectomy 08/19/19.      Normal post op exam.   Medical co-morbidities complicating care: HTN, obesity and prior abdominal surgery (BTL and cholecystectomy). There is no height or weight on file to calculate BMI.   Plan:   Problem List Items Addressed This Visit    None    Visit Diagnoses    Ovarian cancer on right [facility])    -  Primary     Discussed the recommendation for 6 cycles of carboplatin/taxol chemotherapy in view of propensity of high grade serous ovarian cancer to recur after surgery.  Estimated risk of recurrence 20-30%.  She is healthy and should do well with chemotherapy.    She needs germline genetic testing for ovarian cancer susceptibility mutations.  Does not need somatic tumor testing, as PARP inhibitor maintenance not approved for stage I disease.   Suggested return to clinic after completing chemotherapy with Medical Oncology.      The patient's diagnosis, an outline of the further diagnostic and laboratory studies which will be required, the recommendation, and alternatives were discussed.  All questions were answered to the patient's satisfaction.  Mellody Drown, MD  CC:  [name], MD [address] [place],  [place] [address] [phone]

## 2019-09-03 NOTE — Progress Notes (Signed)
Tumor Board Documentation  Carrie Roberson was presented by Mariea Clonts RN for high grade serous carcinoma, ovary, at our Tumor Board on 09/02/2019, which included representatives from medical oncology, radiation oncology, surgical oncology, pathology, navigation.  Carrie Roberson currently presents as a current patient with history of the following treatments: surgical intervention(s).  Additionally, we reviewed previous medical and familial history, history of present illness, and recent lab results along with all available histopathologic and imaging studies. The tumor board considered available treatment options and made the following recommendations: Adjuvant chemotherapy Discussed the recommendation for 6 cycles of carboplatin/taxol chemotherapy in view of propensity of high grade serous ovarian cancer to recur after surgery.  Estimated risk of recurrence 20-30%.   She needs germline genetic testing for ovarian cancer susceptibility mutations.  Does not need somatic tumor testing, as PARP inhibitor maintenance not approved for stage I disease.  The following procedures/referrals were also placed: No orders of the defined types were placed in this encounter.   Clinical Trial Status: not discussed   Staging used: Pathologic Stage AJCC Staging: T: 1c2 N: 0    FIGO 1C2  National site-specific guidelines ASCO, NCCN were discussed with respect to the case.  Tumor board is a meeting of clinicians from various specialty areas who evaluate and discuss patients for whom a multidisciplinary approach is being considered. Final determinations in the plan of care are those of the provider(s). The responsibility for follow up of recommendations given during tumor board is that of the provider.   Today's extended care, comprehensive team conference, Carrie Roberson was not present for the discussion and was not examined.   Multidisciplinary Tumor Board is a multidisciplinary case peer review process.  Decisions discussed  in the Multidisciplinary Tumor Board reflect the opinions of the specialists present at the conference without having examined the patient.  Ultimately, treatment and diagnostic decisions rest with the primary provider(s) and the patient.

## 2019-09-06 ENCOUNTER — Other Ambulatory Visit: Payer: Self-pay | Admitting: Family Medicine

## 2019-09-06 DIAGNOSIS — K589 Irritable bowel syndrome without diarrhea: Secondary | ICD-10-CM

## 2019-09-07 ENCOUNTER — Inpatient Hospital Stay (HOSPITAL_BASED_OUTPATIENT_CLINIC_OR_DEPARTMENT_OTHER): Payer: Medicare Other | Admitting: Oncology

## 2019-09-07 ENCOUNTER — Other Ambulatory Visit: Payer: Self-pay

## 2019-09-07 ENCOUNTER — Telehealth: Payer: Self-pay

## 2019-09-07 ENCOUNTER — Telehealth (INDEPENDENT_AMBULATORY_CARE_PROVIDER_SITE_OTHER): Payer: Self-pay

## 2019-09-07 ENCOUNTER — Other Ambulatory Visit: Payer: Self-pay | Admitting: *Deleted

## 2019-09-07 ENCOUNTER — Encounter: Payer: Self-pay | Admitting: Oncology

## 2019-09-07 VITALS — BP 116/72 | HR 71 | Temp 97.7°F | Ht 63.0 in | Wt 183.0 lb

## 2019-09-07 DIAGNOSIS — C561 Malignant neoplasm of right ovary: Secondary | ICD-10-CM

## 2019-09-07 DIAGNOSIS — M1990 Unspecified osteoarthritis, unspecified site: Secondary | ICD-10-CM | POA: Diagnosis not present

## 2019-09-07 DIAGNOSIS — Z90710 Acquired absence of both cervix and uterus: Secondary | ICD-10-CM | POA: Diagnosis not present

## 2019-09-07 DIAGNOSIS — D649 Anemia, unspecified: Secondary | ICD-10-CM

## 2019-09-07 DIAGNOSIS — Z90722 Acquired absence of ovaries, bilateral: Secondary | ICD-10-CM | POA: Diagnosis not present

## 2019-09-07 DIAGNOSIS — K219 Gastro-esophageal reflux disease without esophagitis: Secondary | ICD-10-CM | POA: Diagnosis not present

## 2019-09-07 DIAGNOSIS — Z7901 Long term (current) use of anticoagulants: Secondary | ICD-10-CM | POA: Diagnosis not present

## 2019-09-07 DIAGNOSIS — I10 Essential (primary) hypertension: Secondary | ICD-10-CM | POA: Diagnosis not present

## 2019-09-07 DIAGNOSIS — Z7189 Other specified counseling: Secondary | ICD-10-CM | POA: Diagnosis not present

## 2019-09-07 DIAGNOSIS — K589 Irritable bowel syndrome without diarrhea: Secondary | ICD-10-CM | POA: Diagnosis not present

## 2019-09-07 DIAGNOSIS — F419 Anxiety disorder, unspecified: Secondary | ICD-10-CM | POA: Diagnosis not present

## 2019-09-07 DIAGNOSIS — Z803 Family history of malignant neoplasm of breast: Secondary | ICD-10-CM | POA: Diagnosis not present

## 2019-09-07 DIAGNOSIS — Z79899 Other long term (current) drug therapy: Secondary | ICD-10-CM | POA: Diagnosis not present

## 2019-09-07 MED ORDER — LORAZEPAM 0.5 MG PO TABS: 0.5000 mg | ORAL_TABLET | Freq: Four times a day (QID) | ORAL | 0 refills | Status: DC | PRN

## 2019-09-07 MED ORDER — ONDANSETRON HCL 8 MG PO TABS: 8.0000 mg | ORAL_TABLET | Freq: Two times a day (BID) | ORAL | 1 refills | Status: DC | PRN

## 2019-09-07 MED ORDER — LIDOCAINE-PRILOCAINE 2.5-2.5 % EX CREA: TOPICAL_CREAM | CUTANEOUS | 3 refills | Status: DC

## 2019-09-07 MED ORDER — PROCHLORPERAZINE MALEATE 10 MG PO TABS: 10.0000 mg | ORAL_TABLET | Freq: Four times a day (QID) | ORAL | 1 refills | Status: DC | PRN

## 2019-09-07 MED ORDER — DEXAMETHASONE 4 MG PO TABS: 8.0000 mg | ORAL_TABLET | Freq: Every day | ORAL | 1 refills | Status: DC

## 2019-09-07 NOTE — Patient Instructions (Signed)
Paclitaxel injection What is this medicine? PACLITAXEL (PAK li TAX el) is a chemotherapy drug. It targets fast dividing cells, like cancer cells, and causes these cells to die. This medicine is used to treat ovarian cancer, breast cancer, lung cancer, Kaposi's sarcoma, and other cancers. This medicine may be used for other purposes; ask your health care provider or pharmacist if you have questions. COMMON BRAND NAME(S): Onxol, Taxol What should I tell my health care provider before I take this medicine? They need to know if you have any of these conditions:  history of irregular heartbeat  liver disease  low blood counts, like low white cell, platelet, or red cell counts  lung or breathing disease, like asthma  tingling of the fingers or toes, or other nerve disorder  an unusual or allergic reaction to paclitaxel, alcohol, polyoxyethylated castor oil, other chemotherapy, other medicines, foods, dyes, or preservatives  pregnant or trying to get pregnant  breast-feeding How should I use this medicine? This drug is given as an infusion into a vein. It is administered in a hospital or clinic by a specially trained health care professional. Talk to your pediatrician regarding the use of this medicine in children. Special care may be needed. Overdosage: If you think you have taken too much of this medicine contact a poison control center or emergency room at once. NOTE: This medicine is only for you. Do not share this medicine with others. What if I miss a dose? It is important not to miss your dose. Call your doctor or health care professional if you are unable to keep an appointment. What may interact with this medicine? Do not take this medicine with any of the following medications:  disulfiram  metronidazole This medicine may also interact with the following medications:  antiviral medicines for hepatitis, HIV or AIDS  certain antibiotics like erythromycin and  clarithromycin  certain medicines for fungal infections like ketoconazole and itraconazole  certain medicines for seizures like carbamazepine, phenobarbital, phenytoin  gemfibrozil  nefazodone  rifampin  St. John's wort This list may not describe all possible interactions. Give your health care provider a list of all the medicines, herbs, non-prescription drugs, or dietary supplements you use. Also tell them if you smoke, drink alcohol, or use illegal drugs. Some items may interact with your medicine. What should I watch for while using this medicine? Your condition will be monitored carefully while you are receiving this medicine. You will need important blood work done while you are taking this medicine. This medicine can cause serious allergic reactions. To reduce your risk you will need to take other medicine(s) before treatment with this medicine. If you experience allergic reactions like skin rash, itching or hives, swelling of the face, lips, or tongue, tell your doctor or health care professional right away. In some cases, you may be given additional medicines to help with side effects. Follow all directions for their use. This drug may make you feel generally unwell. This is not uncommon, as chemotherapy can affect healthy cells as well as cancer cells. Report any side effects. Continue your course of treatment even though you feel ill unless your doctor tells you to stop. Call your doctor or health care professional for advice if you get a fever, chills or sore throat, or other symptoms of a cold or flu. Do not treat yourself. This drug decreases your body's ability to fight infections. Try to avoid being around people who are sick. This medicine may increase your risk to bruise   or bleed. Call your doctor or health care professional if you notice any unusual bleeding. Be careful brushing and flossing your teeth or using a toothpick because you may get an infection or bleed more easily.  If you have any dental work done, tell your dentist you are receiving this medicine. Avoid taking products that contain aspirin, acetaminophen, ibuprofen, naproxen, or ketoprofen unless instructed by your doctor. These medicines may hide a fever. Do not become pregnant while taking this medicine. Women should inform their doctor if they wish to become pregnant or think they might be pregnant. There is a potential for serious side effects to an unborn child. Talk to your health care professional or pharmacist for more information. Do not breast-feed an infant while taking this medicine. Men are advised not to father a child while receiving this medicine. This product may contain alcohol. Ask your pharmacist or healthcare provider if this medicine contains alcohol. Be sure to tell all healthcare providers you are taking this medicine. Certain medicines, like metronidazole and disulfiram, can cause an unpleasant reaction when taken with alcohol. The reaction includes flushing, headache, nausea, vomiting, sweating, and increased thirst. The reaction can last from 30 minutes to several hours. What side effects may I notice from receiving this medicine? Side effects that you should report to your doctor or health care professional as soon as possible:  allergic reactions like skin rash, itching or hives, swelling of the face, lips, or tongue  breathing problems  changes in vision  fast, irregular heartbeat  high or low blood pressure  mouth sores  pain, tingling, numbness in the hands or feet  signs of decreased platelets or bleeding - bruising, pinpoint red spots on the skin, black, tarry stools, blood in the urine  signs of decreased red blood cells - unusually weak or tired, feeling faint or lightheaded, falls  signs of infection - fever or chills, cough, sore throat, pain or difficulty passing urine  signs and symptoms of liver injury like dark yellow or brown urine; general ill feeling or  flu-like symptoms; light-colored stools; loss of appetite; nausea; right upper belly pain; unusually weak or tired; yellowing of the eyes or skin  swelling of the ankles, feet, hands  unusually slow heartbeat Side effects that usually do not require medical attention (report to your doctor or health care professional if they continue or are bothersome):  diarrhea  hair loss  loss of appetite  muscle or joint pain  nausea, vomiting  pain, redness, or irritation at site where injected  tiredness This list may not describe all possible side effects. Call your doctor for medical advice about side effects. You may report side effects to FDA at 1-800-FDA-1088. Where should I keep my medicine? This drug is given in a hospital or clinic and will not be stored at home. NOTE: This sheet is a summary. It may not cover all possible information. If you have questions about this medicine, talk to your doctor, pharmacist, or health care provider.  2020 Elsevier/Gold Standard (2017-03-12 13:14:55) Carboplatin injection What is this medicine? CARBOPLATIN (KAR boe pla tin) is a chemotherapy drug. It targets fast dividing cells, like cancer cells, and causes these cells to die. This medicine is used to treat ovarian cancer and many other cancers. This medicine may be used for other purposes; ask your health care provider or pharmacist if you have questions. COMMON BRAND NAME(S): Paraplatin What should I tell my health care provider before I take this medicine? They need to   know if you have any of these conditions:  blood disorders  hearing problems  kidney disease  recent or ongoing radiation therapy  an unusual or allergic reaction to carboplatin, cisplatin, other chemotherapy, other medicines, foods, dyes, or preservatives  pregnant or trying to get pregnant  breast-feeding How should I use this medicine? This drug is usually given as an infusion into a vein. It is administered in a  hospital or clinic by a specially trained health care professional. Talk to your pediatrician regarding the use of this medicine in children. Special care may be needed. Overdosage: If you think you have taken too much of this medicine contact a poison control center or emergency room at once. NOTE: This medicine is only for you. Do not share this medicine with others. What if I miss a dose? It is important not to miss a dose. Call your doctor or health care professional if you are unable to keep an appointment. What may interact with this medicine?  medicines for seizures  medicines to increase blood counts like filgrastim, pegfilgrastim, sargramostim  some antibiotics like amikacin, gentamicin, neomycin, streptomycin, tobramycin  vaccines Talk to your doctor or health care professional before taking any of these medicines:  acetaminophen  aspirin  ibuprofen  ketoprofen  naproxen This list may not describe all possible interactions. Give your health care provider a list of all the medicines, herbs, non-prescription drugs, or dietary supplements you use. Also tell them if you smoke, drink alcohol, or use illegal drugs. Some items may interact with your medicine. What should I watch for while using this medicine? Your condition will be monitored carefully while you are receiving this medicine. You will need important blood work done while you are taking this medicine. This drug may make you feel generally unwell. This is not uncommon, as chemotherapy can affect healthy cells as well as cancer cells. Report any side effects. Continue your course of treatment even though you feel ill unless your doctor tells you to stop. In some cases, you may be given additional medicines to help with side effects. Follow all directions for their use. Call your doctor or health care professional for advice if you get a fever, chills or sore throat, or other symptoms of a cold or flu. Do not treat  yourself. This drug decreases your body's ability to fight infections. Try to avoid being around people who are sick. This medicine may increase your risk to bruise or bleed. Call your doctor or health care professional if you notice any unusual bleeding. Be careful brushing and flossing your teeth or using a toothpick because you may get an infection or bleed more easily. If you have any dental work done, tell your dentist you are receiving this medicine. Avoid taking products that contain aspirin, acetaminophen, ibuprofen, naproxen, or ketoprofen unless instructed by your doctor. These medicines may hide a fever. Do not become pregnant while taking this medicine. Women should inform their doctor if they wish to become pregnant or think they might be pregnant. There is a potential for serious side effects to an unborn child. Talk to your health care professional or pharmacist for more information. Do not breast-feed an infant while taking this medicine. What side effects may I notice from receiving this medicine? Side effects that you should report to your doctor or health care professional as soon as possible:  allergic reactions like skin rash, itching or hives, swelling of the face, lips, or tongue  signs of infection - fever or   chills, cough, sore throat, pain or difficulty passing urine  signs of decreased platelets or bleeding - bruising, pinpoint red spots on the skin, black, tarry stools, nosebleeds  signs of decreased red blood cells - unusually weak or tired, fainting spells, lightheadedness  breathing problems  changes in hearing  changes in vision  chest pain  high blood pressure  low blood counts - This drug may decrease the number of white blood cells, red blood cells and platelets. You may be at increased risk for infections and bleeding.  nausea and vomiting  pain, swelling, redness or irritation at the injection site  pain, tingling, numbness in the hands or  feet  problems with balance, talking, walking  trouble passing urine or change in the amount of urine Side effects that usually do not require medical attention (report to your doctor or health care professional if they continue or are bothersome):  hair loss  loss of appetite  metallic taste in the mouth or changes in taste This list may not describe all possible side effects. Call your doctor for medical advice about side effects. You may report side effects to FDA at 1-800-FDA-1088. Where should I keep my medicine? This drug is given in a hospital or clinic and will not be stored at home. NOTE: This sheet is a summary. It may not cover all possible information. If you have questions about this medicine, talk to your doctor, pharmacist, or health care provider.  2020 Elsevier/Gold Standard (2007-10-14 14:38:05) Pegfilgrastim injection What is this medicine? PEGFILGRASTIM (PEG fil gra stim) is a long-acting granulocyte colony-stimulating factor that stimulates the growth of neutrophils, a type of white blood cell important in the body's fight against infection. It is used to reduce the incidence of fever and infection in patients with certain types of cancer who are receiving chemotherapy that affects the bone marrow, and to increase survival after being exposed to high doses of radiation. This medicine may be used for other purposes; ask your health care provider or pharmacist if you have questions. COMMON BRAND NAME(S): Fulphila, Neulasta, UDENYCA, Ziextenzo What should I tell my health care provider before I take this medicine? They need to know if you have any of these conditions:  kidney disease  latex allergy  ongoing radiation therapy  sickle cell disease  skin reactions to acrylic adhesives (On-Body Injector only)  an unusual or allergic reaction to pegfilgrastim, filgrastim, other medicines, foods, dyes, or preservatives  pregnant or trying to get  pregnant  breast-feeding How should I use this medicine? This medicine is for injection under the skin. If you get this medicine at home, you will be taught how to prepare and give the pre-filled syringe or how to use the On-body Injector. Refer to the patient Instructions for Use for detailed instructions. Use exactly as directed. Tell your healthcare provider immediately if you suspect that the On-body Injector may not have performed as intended or if you suspect the use of the On-body Injector resulted in a missed or partial dose. It is important that you put your used needles and syringes in a special sharps container. Do not put them in a trash can. If you do not have a sharps container, call your pharmacist or healthcare provider to get one. Talk to your pediatrician regarding the use of this medicine in children. While this drug may be prescribed for selected conditions, precautions do apply. Overdosage: If you think you have taken too much of this medicine contact a poison control center   or emergency room at once. NOTE: This medicine is only for you. Do not share this medicine with others. What if I miss a dose? It is important not to miss your dose. Call your doctor or health care professional if you miss your dose. If you miss a dose due to an On-body Injector failure or leakage, a new dose should be administered as soon as possible using a single prefilled syringe for manual use. What may interact with this medicine? Interactions have not been studied. Give your health care provider a list of all the medicines, herbs, non-prescription drugs, or dietary supplements you use. Also tell them if you smoke, drink alcohol, or use illegal drugs. Some items may interact with your medicine. This list may not describe all possible interactions. Give your health care provider a list of all the medicines, herbs, non-prescription drugs, or dietary supplements you use. Also tell them if you smoke, drink  alcohol, or use illegal drugs. Some items may interact with your medicine. What should I watch for while using this medicine? You may need blood work done while you are taking this medicine. If you are going to need a MRI, CT scan, or other procedure, tell your doctor that you are using this medicine (On-Body Injector only). What side effects may I notice from receiving this medicine? Side effects that you should report to your doctor or health care professional as soon as possible:  allergic reactions like skin rash, itching or hives, swelling of the face, lips, or tongue  back pain  dizziness  fever  pain, redness, or irritation at site where injected  pinpoint red spots on the skin  red or dark-brown urine  shortness of breath or breathing problems  stomach or side pain, or pain at the shoulder  swelling  tiredness  trouble passing urine or change in the amount of urine Side effects that usually do not require medical attention (report to your doctor or health care professional if they continue or are bothersome):  bone pain  muscle pain This list may not describe all possible side effects. Call your doctor for medical advice about side effects. You may report side effects to FDA at 1-800-FDA-1088. Where should I keep my medicine? Keep out of the reach of children. If you are using this medicine at home, you will be instructed on how to store it. Throw away any unused medicine after the expiration date on the label. NOTE: This sheet is a summary. It may not cover all possible information. If you have questions about this medicine, talk to your doctor, pharmacist, or health care provider.  2020 Elsevier/Gold Standard (2017-10-14 16:57:08)  

## 2019-09-07 NOTE — Progress Notes (Signed)
Met with Carrie Roberson alongside Dr. Janese Banks. At the end of her consult we reviewed all of her upcoming appointments. Provided all of these appointments in her AVS. She will await call regarding port placement. She is able to see her dentist this afternoon at 1500 to evaluate replacement of crown prior to chemo start. She was encouraged to call for any future needs.

## 2019-09-07 NOTE — Progress Notes (Signed)
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

## 2019-09-07 NOTE — Telephone Encounter (Signed)
Called patient and informed her to let her know about her port-a-cath insertion that will be at the [facility] on 09/11/19 by Dr. Ronalee Belts and needed to be fasting 8 hours prior and and that was able to take her medications with a sip of water. Patient was also informed that she needed to go to the [facility] on 09/09/2019 between 12:30-2:30 PM. Patient agreed and had no further questions.

## 2019-09-07 NOTE — Progress Notes (Signed)
Hematology/Oncology Consult note [facility] Telephone:([phone] [phone]  Patient Care Team: Juline Patch, MD as PCP - General (Family Medicine) Colvin Caroli, MD as Consulting Physician (Family Medicine) Lin Landsman, MD as Consulting Physician (Gastroenterology) Clent Jacks, RN as Oncology Nurse Navigator   Name of the patient: Carrie Roberson  [id number]  1946-05-13    Reason for referral-new diagnosis of ovarian carcinoma   Referring physician-Dr. Fransisca Connors  Date of visit: 09/07/19   History of presenting illness- Patient is a 74 year old female who presented with symptoms of pelvic pressure and recurrent urinary tract infection.  This was followed by an MR pelvis with and without contrast in December 2020 which showed a large cystic mass arising from the right ovary measuring 1.8 cm.  This was followed by TAH/BSO with peritoneal biopsies with pelvic/aortic lymph node dissection pelvic washings and omentectomy with Dr. Fransisca Connors and Dr. Georgianne Fick on 08/19/2019.  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  Angiolymphatic invasion is present.  Bilateral fallopian tubes were negative for atypia and malignancy.  Peritoneal stone excision negative for malignancy.  Uterus with cervix negative for atypia and malignancy.  Bladder, right gutter negative for malignancy.  5 lymph nodes were sampled and were negative for malignancy.  Pelvic washings were also negative for malignancy.  Pathologic staging FIGO 1 C2  Patient was seen by Dr. Fransisca Connors postoperatively and recommendation was to proceed with adjuvant chemotherapy carbotaxol for 6 cycles.  At baseline patient lives with her sister and is independent of her ADLs and IADLs.  She currently reports some burning lower abdominal pain which occasionally radiates to her right thigh.  She has been using oxycodone at night for the same family history significant  for breast cancer in her mother as well as 2 maternal first cousins  ECOG PS- 1  Pain scale- 3   Review of systems- Review of Systems  Constitutional: Positive for malaise/fatigue. Negative for chills, fever and weight loss.  HENT: Negative for congestion, ear discharge and nosebleeds.   Eyes: Negative for blurred vision.  Respiratory: Negative for cough, hemoptysis, sputum production, shortness of breath and wheezing.   Cardiovascular: Negative for chest pain, palpitations, orthopnea and claudication.  Gastrointestinal: Positive for abdominal pain. Negative for blood in stool, constipation, diarrhea, heartburn, melena, nausea and vomiting.  Genitourinary: Negative for dysuria, flank pain, frequency, hematuria and urgency.  Musculoskeletal: Negative for back pain, joint pain and myalgias.  Skin: Negative for rash.  Neurological: Negative for dizziness, tingling, focal weakness, seizures, weakness and headaches.  Endo/Heme/Allergies: Does not bruise/bleed easily.  Psychiatric/Behavioral: Negative for depression and suicidal ideas. The patient does not have insomnia.     Allergies  Allergen Reactions  . Citalopram Nausea And Vomiting  . Ivp Dye [Iodinated Diagnostic Agents] Hives and Swelling    hives  . Meloxicam     unknown  . Adhesive [Tape] Rash  . Amlodipine Rash  . Ampicillin Rash    Did it involve swelling of the face/tongue/throat, SOB, or low BP? No Did it involve sudden or severe rash/hives, skin peeling, or any reaction on the inside of your mouth or nose? Yes Did you need to seek medical attention at a hospital or doctor's office? Yes When did it last happen? 10 + years If all above answers are "NO", may proceed with cephalosporin use.  . Cefoxitin Rash  . Estradiol Rash  . Estropipate Rash  . Olmesartan Medoxomil-Hctz Rash  . Sertraline Rash       .  Sulfa Antibiotics Rash    Patient Active Problem List   Diagnosis Date Noted  . Ovarian mass 08/19/2019   . 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) 08/19/2019  . 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[place])   . Cyst of ovary 07/15/2019  . Anxiety 09/02/2017  . 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 FH: diabetes mellitus 09/02/2017  . 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 Atypical nevus 09/02/2017  . IBS (irritable bowel syndrome) 09/02/2017  . Atrophic vaginitis 09/02/2017  . Chronic idiopathic urticaria 09/02/2017  . GERD (gastroesophageal reflux disease) 09/02/2017     Past Medical History:  Diagnosis Date  . Anxiety   . Arthritis   . GERD (gastroesophageal reflux disease)   . History of ischemic colitis 09/02/2017  . Hx of colonic polyp   . Hypertension    H/O-PT TAKEN OFF BY PCP AS OF 2019  . IBS (irritable bowel syndrome)   . Ovarian cyst   . Vaginal atrophy      Past Surgical History:  Procedure Laterality Date  . BREAST BIOPSY Left    needle bx/clip-neg  . CERVICAL BIOPSY  W/ LOOP ELECTRODE EXCISION    . CHOLECYSTECTOMY    . Colonoscopoy  2007, 2008, 2011  . COLONOSCOPY WITH PROPOFOL N/A 05/05/2018   Procedure: COLONOSCOPY WITH PROPOFOL;  Surgeon: Lin Landsman, MD;  Location: [facility] ENDOSCOPY;  Service: Gastroenterology;  Laterality: N/A;  . HYSTERECTOMY ABDOMINAL WITH SALPINGO-OOPHORECTOMY N/A 08/19/2019   Procedure: HYSTERECTOMY ABDOMINAL WITH SALPINGO-OOPHORECTOMY WITH PELVIC WASHINGS PERITONEAL BIOPSIES AND PARA AORTIC LYMPH NODE DISSECTION;  Surgeon: [name], MD;  Location: [facility] ORS;  Service: Gynecology;  Laterality: N/A;  . LEEP    . OMENTECTOMY N/A 08/19/2019   Procedure: OMENTECTOMY;  Surgeon: [name], MD;  Location: [facility] ORS;  Service: Gynecology;  Laterality: N/A;  . TUBAL LIGATION  1999    Social History   Socioeconomic History  . Marital status: Single    Spouse name: Not on file  . Number of children: 0  . Years of education: Not on file  .  Highest education level: Associate degree: academic program  Occupational History  . Occupation: Retired  Tobacco Use  . Smoking status: Never Smoker  . Smokeless tobacco: Never Used  . Tobacco comment: smoking cessation materials not required  Substance and Sexual Activity  . Alcohol use: Yes    Comment: rarely  . Drug use: No  . Sexual activity: Never    Birth control/protection: Post-menopausal  Other Topics Concern  . Not on file  Social History Narrative  . Not on file   Social Determinants of Health   Financial Resource Strain:   . Difficulty of Paying Living Expenses: Not on file  Food Insecurity:   . Worried About Charity fundraiser in the Last Year: Not on file  . Ran Out of Food in the Last Year: Not on file  Transportation Needs:   . Lack of Transportation (Medical): Not on file  . Lack of Transportation (Non-Medical): Not on file  Physical Activity:   . Days of Exercise per Week: Not on file  . Minutes of Exercise per Session: Not on file  Stress:   . Feeling of Stress : Not on file  Social Connections:   . Frequency of Communication with Friends and Family: Not on file  . Frequency of Social Gatherings with Friends and Family: Not on file  . Attends Religious Services: Not on  file  . Active Member of Clubs or Organizations: Not on file  . Attends Archivist Meetings: Not on file  . Marital Status: Not on file  Intimate Partner Violence:   . Fear of Current or Ex-Partner: Not on file  . Emotionally Abused: Not on file  . Physically Abused: Not on file  . Sexually Abused: Not on file     Family History  Problem Relation Age of Onset  . Heart failure Mother   . Atrial fibrillation Mother   . Hypertension Mother   . Diabetes Father   . Heart attack Father   . Breast cancer Maternal Aunt 70  . Breast cancer Maternal Grandmother 80  . Breast cancer Cousin        2 mat cousins  . Hypertension Sister   . Hypertension Sister   . Arthritis  Sister   . Leukemia Paternal Aunt   . Leukemia Paternal Uncle   . Brain cancer Paternal Aunt   . Prostate cancer Neg Hx   . Kidney cancer Neg Hx      Current Outpatient Medications:  .  benzocaine-resorcinol (VAGISIL) 5-2 % vaginal cream, Place 1 application vaginally daily as needed for itching or irritation., Disp: , Rfl:  .  enoxaparin (LOVENOX) 40 MG/0.4ML injection, Inject 0.4 mLs (40 mg total) into the skin daily., Disp: 8.8 mL, Rfl: 0 .  hydrochlorothiazide (HYDRODIURIL) 25 MG tablet, Take 1 tablet (25 mg total) by mouth daily., Disp: 30 tablet, Rfl: 0 .  hydrocortisone cream 1 %, Apply 1 application topically daily as needed for itching., Disp: , Rfl:  .  omeprazole (PRILOSEC) 20 MG capsule, Take 20 mg by mouth daily as needed (acid reflux)., Disp: , Rfl:  .  oxyCODONE-acetaminophen (PERCOCET/ROXICET) 5-325 MG tablet, Take 1-2 tablets by mouth every 4 (four) hours as needed for moderate pain., Disp: 40 tablet, Rfl: 0 .  amitriptyline (ELAVIL) 25 MG tablet, TAKE 1 TABLET BY MOUTH EVERYDAY AT BEDTIME (Patient not taking: Reported on 09/07/2019), Disp: 90 tablet, Rfl: 0 .  cetirizine (ZYRTEC) 10 MG tablet, Take 10 mg by mouth at bedtime., Disp: , Rfl:  .  diphenhydrAMINE (BENADRYL) 25 MG tablet, Take 25 mg by mouth daily as needed for itching or allergies., Disp: , Rfl:  .  docusate sodium (COLACE) 100 MG capsule, Take 1 capsule (100 mg total) by mouth 2 (two) times daily. (Patient not taking: Reported on 09/07/2019), Disp: 10 capsule, Rfl: 0 .  fluticasone (FLONASE) 50 MCG/ACT nasal spray, Place 2 sprays into both nostrils daily. (Patient not taking: Reported on 09/02/2019), Disp: 16 g, Rfl: 0 .  guaiFENesin (MUCINEX) 600 MG 12 hr tablet, Take 600 mg by mouth at bedtime as needed (congestion)., Disp: , Rfl:  .  ibuprofen (ADVIL) 600 MG tablet, Take 1 tablet (600 mg total) by mouth every 6 (six) hours as needed for fever or headache. (Patient not taking: Reported on 09/07/2019), Disp: 30  tablet, Rfl: 0   Physical exam:  Vitals:   09/07/19 0944  BP: 116/72  Pulse: 71  Temp: 97.7 F (36.5 C)  TempSrc: Tympanic  Weight: 183 lb (83 kg)  Height: 5\' 3"  (1.6 m)   Physical Exam HENT:     Head: Normocephalic and atraumatic.  Eyes:     Pupils: Pupils are equal, round, and reactive to light.  Cardiovascular:     Rate and Rhythm: Normal rate and regular rhythm.     Heart sounds: Normal heart sounds.  Pulmonary:  Effort: Pulmonary effort is normal.     Breath sounds: Normal breath sounds.  Abdominal:     General: Bowel sounds are normal.     Palpations: Abdomen is soft.     Comments: Midline surgical wound from recent surgery is healing well.  No concerning signs of infection  Musculoskeletal:     Cervical back: Normal range of motion.  Skin:    General: Skin is warm and dry.  Neurological:     Mental Status: She is alert and oriented to person, place, and time.        CMP Latest Ref Rng & Units 08/22/2019  Glucose 70 - 99 mg/dL 92  BUN 8 - 23 mg/dL 7(L)  Creatinine 0.44 - 1.00 mg/dL 0.50  Sodium 135 - 145 mmol/L 137  Potassium 3.5 - 5.1 mmol/L 4.0  Chloride 98 - 111 mmol/L 105  CO2 22 - 32 mmol/L 23  Calcium 8.9 - 10.3 mg/dL 9.5  Total Protein 6.5 - 8.1 g/dL 6.1(L)  Total Bilirubin 0.3 - 1.2 mg/dL 1.1  Alkaline Phos 38 - 126 U/L 141(H)  AST 15 - 41 U/L 59(H)  ALT 0 - 44 U/L 140(H)   CBC Latest Ref Rng & Units 08/22/2019  WBC 4.0 - 10.5 K/uL 5.0  Hemoglobin 12.0 - 15.0 g/dL 10.6(L)  Hematocrit 36.0 - 46.0 % 32.7(L)  Platelets 150 - 400 K/uL 227     Assessment and plan- Patient is a 74 y.o. 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  Discussed the results of final pathology in detail which shows patient has stage I C2 high-grade serous carcinoma of the ovary.  This has a recurrence risk of close to 20% after surgery alone and patient would benefit  from adjuvant chemotherapy.I would recommend minimum 3 but ideally 6 treatments of carboplatin AUC 5 along with Taxol 175 mg per metered square IV every 3 weeks.  Discussed risks and benefits of chemotherapy including all but not limited to nausea, vomiting, low blood counts, risk of infections and hospitalizations.  Risk of peripheral neuropathy and infusion reaction associated with Taxol.  Treatment will be given with a curative intent.  Patient understands and agrees to proceed as planned.  She is already received 2 doses of her Covid vaccine.  There have been 2 meta-analysis done in the setting of early-stage ovarian cancer which have shown improvement in both disease-free survival as well as overall survival in patients receiving chemotherapy.  I would like to give her 6 cycles if she can tolerate it as there was a lower risk of recurrence seen for 6 versus 3 cycles in patients with serous ovarian carcinoma but no significant improvement in overall survival.  I will also plan to give her growth factor support with ongoing Neulasta support with each cycle.  2.  I will obtain baseline CT chest abdomen and pelvis with contrast to complete her staging work-up.  She did have MRI pelvis with and without contrast which did not show any disease elsewhere.  3.  Plan for chemo teach and port placement this week.  Patient also needs to see her dentist to get some outstanding dental issues taken care of.  I will tentatively plan to start first cycle of chemotherapy next week.  4.  Given significant family history of breast cancer and personal history of ovarian cancer I would like to refer her to genetic counseling.  We will  send out [employer] testing next week.  She does not have any children of her own but does have other siblings.  She also needs to schedule her screening mammogram as she has not had one since 2019  5.  She will continue nightly oxycodone which she takes for her abdominal pain.  6.  Patient  noted to have some baseline normocytic anemia And her hemoglobin was 10.9 which I suspect is postsurgical.  Prior to that her hemoglobin was 13.8.  I will add iron studies B12 and folate to her next set of labs in 1 week  Cancer Staging Malignant neoplasm of right ovary [facility]) Staging form: Ovary, Fallopian Tube, and Primary Peritoneal Carcinoma, AJCC 8th Edition - Pathologic stage from 09/07/2019: FIGO Stage IC2, calculated as Stage IC (pT1c2, pN0, cM0) - Signed by Sindy Guadeloupe, MD on 09/07/2019   Thank you for this kind referral and the opportunity to participate in the care of this patient   Visit Diagnosis 1. Ovarian cancer on right ([place])   2. Goals of care, counseling/discussion   3. 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[facility])   4. Normocytic anemia     Dr. Randa Evens, MD, MPH [facility] at [facility] [id number] 09/07/2019 4:39 PM

## 2019-09-07 NOTE — Telephone Encounter (Signed)
Wayna Chalet, CMA,  Hello. Patient is needing a port insertion soon since she will start chemo on 09/13/2019. Can you please work your magic! :-)  Me: HI, how about 09/11/19 with Dr. Delana Meyer with a 1:00 pm arrival time to the [facility]. NPO for 8 hours, take all meds with small sips of water, can have one person with her. She needs to do covid testing between 12:30-2:30 pm today at the [facility]. Sorry she will do covid testing on 09/09/19 between 12:30-2:30 pm.   Wayna Chalet, CMA No worries. I totally understand. I will call pt and let her know. Thank you so much!!!

## 2019-09-07 NOTE — Progress Notes (Signed)
Patient is here today to establish care for ovarian cancer. Patient had a complete hysterectomy on 08/19/2019 by Dr. Georgianne Fick. Patient has some pelvic pain radiating to her right leg.

## 2019-09-09 ENCOUNTER — Inpatient Hospital Stay (HOSPITAL_BASED_OUTPATIENT_CLINIC_OR_DEPARTMENT_OTHER): Payer: Medicare Other | Admitting: Oncology

## 2019-09-09 ENCOUNTER — Inpatient Hospital Stay: Payer: Medicare Other

## 2019-09-09 ENCOUNTER — Other Ambulatory Visit
Admission: RE | Admit: 2019-09-09 | Discharge: 2019-09-09 | Disposition: A | Discharge status: Home / Self Care | Payer: Medicare Other | Source: Ambulatory Visit | Attending: Vascular Surgery | Admitting: Vascular Surgery

## 2019-09-09 ENCOUNTER — Other Ambulatory Visit (INDEPENDENT_AMBULATORY_CARE_PROVIDER_SITE_OTHER): Payer: Self-pay | Admitting: Nurse Practitioner

## 2019-09-09 ENCOUNTER — Other Ambulatory Visit: Payer: Self-pay

## 2019-09-09 ENCOUNTER — Encounter: Payer: Self-pay | Admitting: Oncology

## 2019-09-09 VITALS — BP 127/64 | HR 62 | Temp 97.4°F | Resp 24

## 2019-09-09 DIAGNOSIS — C561 Malignant neoplasm of right ovary: Secondary | ICD-10-CM

## 2019-09-09 DIAGNOSIS — Z20822 Contact with and (suspected) exposure to covid-19: Secondary | ICD-10-CM | POA: Diagnosis not present

## 2019-09-09 DIAGNOSIS — D649 Anemia, unspecified: Secondary | ICD-10-CM | POA: Diagnosis not present

## 2019-09-09 DIAGNOSIS — E860 Dehydration: Secondary | ICD-10-CM

## 2019-09-09 DIAGNOSIS — R110 Nausea: Secondary | ICD-10-CM

## 2019-09-09 DIAGNOSIS — Z01812 Encounter for preprocedural laboratory examination: Secondary | ICD-10-CM | POA: Diagnosis not present

## 2019-09-09 DIAGNOSIS — Z90710 Acquired absence of both cervix and uterus: Secondary | ICD-10-CM | POA: Diagnosis not present

## 2019-09-09 DIAGNOSIS — Z803 Family history of malignant neoplasm of breast: Secondary | ICD-10-CM | POA: Diagnosis not present

## 2019-09-09 DIAGNOSIS — K5900 Constipation, unspecified: Secondary | ICD-10-CM

## 2019-09-09 DIAGNOSIS — Z90722 Acquired absence of ovaries, bilateral: Secondary | ICD-10-CM | POA: Diagnosis not present

## 2019-09-09 DIAGNOSIS — F419 Anxiety disorder, unspecified: Secondary | ICD-10-CM | POA: Diagnosis not present

## 2019-09-09 MED ORDER — SODIUM CHLORIDE 0.9 % IV SOLN
Freq: Once | INTRAVENOUS | Status: AC
  Filled 2019-09-09: qty 250

## 2019-09-09 MED ORDER — TRAMADOL HCL 50 MG PO TABS: 50.0000 mg | ORAL_TABLET | Freq: Four times a day (QID) | ORAL | 0 refills | Status: DC | PRN

## 2019-09-09 MED ORDER — ONDANSETRON HCL 4 MG/2ML IJ SOLN
8.0000 mg | Freq: Once | INTRAMUSCULAR | Status: AC
  Administered 2019-09-09: 8 mg via INTRAVENOUS
  Filled 2019-09-09: qty 4

## 2019-09-09 NOTE — H&P (Signed)
[facility] History & Physical Update  The patient was interviewed and re-examined.  The patient's previous History and Physical has been reviewed and is unchanged.  There is no change in the plan of care. We plan to proceed with the scheduled procedure.  Patient has biopsy-proven ovarian carcinoma and requires parenteral chemotherapy for treatment.  Consequently she requires appropriate IV access.  Infuse-a-Port will be placed as has been described to the patient.  Hortencia Pilar, MD  09/09/2019, 10:23 AM

## 2019-09-09 NOTE — Progress Notes (Signed)
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[place])  08/19/2019 Initial Diagnosis   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[place])   09/07/2019 Cancer Staging   Staging form: Ovary, Fallopian Tube, and Primary Peritoneal Carcinoma, AJCC 8th Edition - Pathologic stage from 09/07/2019: FIGO Stage IC2, calculated as Stage IC (pT1c2, pN0, cM0) - Signed by Sindy Guadeloupe, MD on 09/07/2019   09/15/2019 -  Chemotherapy   The patient had palonosetron (ALOXI) injection 0.25 mg, 0.25 mg, Intravenous,  Once, 0 of 6 cycles pegfilgrastim (NEULASTA ONPRO KIT) injection 6 mg, 6 mg, Subcutaneous, Once, 0 of 6 cycles CARBOplatin (PARAPLATIN) 440 mg in sodium chloride 0.9 % 250 mL chemo infusion, 440 mg (original dose ), Intravenous,  Once, 0 of 6 cycles Dose modification:   (Cycle 1) fosaprepitant (EMEND) 150 mg in sodium chloride 0.9 % 145 mL IVPB, 150 mg, Intravenous,  Once, 0 of 6 cycles PACLitaxel (TAXOL) 336 mg in sodium chloride 0.9 % 500 mL chemo infusion (> 42m/m2), 175 mg/m2, Intravenous,  Once, 0 of 6 cycles  for chemotherapy treatment.      Interval history-patient presents to symptom management for complaints of nausea and constipation.  Symptoms have been present for about 2 days.  Patient had a good bowel movement 2 days ago and then has developed some bloating and abdominal discomfort.  She has significant nausea without vomiting.   She denies any pain.  She was started on Percocet for right flank plain that was thought to be due to sciatica but specifically denies abdominal pain.  She mainly takes Percocet at bedtime to help her rest and "take the edge off".  Her appetite has been stable and she has been drinking fluids.  She has not been taking anything for her bowels on a regular basis.  She denies any fevers or recent illness.   She is status post TAH/BSO, laminectomy and right periaortic and pelvic lymph node dissection about 1 month ago.  At follow-up with Dr. SGeorgianne Fickon 08/31/2019 she complained of constipation and was advised to try magnesium citrate.  She also had follow-up with Dr. BFransisca Connorson 09/02/2019 to discuss adjuvant chemotherapy and germline genetic testing.   She is scheduled to begin treatment for ovarian cancer next week and have her port placed on Friday.  Today, she was scheduled for chemo education but unfortunately felt too nauseated to attend.  Her niece is attending in her place.  ECOG FS:1 - Symptomatic but completely ambulatory  Review of systems- Review of Systems  Constitutional: Positive for malaise/fatigue. Negative for chills, fever and weight loss.  HENT: Negative for congestion, ear pain and tinnitus.   Eyes: Negative.  Negative for blurred vision and double vision.  Respiratory: Negative.  Negative for cough, sputum production and shortness of breath.   Cardiovascular: Negative.  Negative for chest pain, palpitations and leg swelling.  Gastrointestinal: Positive for constipation and nausea. Negative for abdominal pain, diarrhea and vomiting.  Genitourinary: Negative for dysuria, frequency and urgency.  Musculoskeletal: Positive for back pain. Negative for falls.  Skin: Negative.  Negative for rash.  Neurological: Negative.  Negative for weakness and headaches.  Endo/Heme/Allergies: Negative.  Does not bruise/bleed easily.  Psychiatric/Behavioral: Negative for depression. The patient is  nervous/anxious and has insomnia.     Current treatment-scheduled to begin carbo/Taxol on 09/15/2019.  Allergies  Allergen Reactions  . Citalopram Nausea And Vomiting  . Ivp Dye [Iodinated Diagnostic Agents] Hives and Swelling    hives  . Meloxicam     unknown  . Adhesive [Tape] Rash  . Amlodipine Rash  . Ampicillin Rash    Did it involve swelling of the face/tongue/throat, SOB, or low BP? No Did it involve sudden or severe rash/hives, skin peeling, or any reaction on the inside of your mouth or nose? Yes Did you need to seek medical attention at a hospital or doctor's office? Yes When did it last happen? 10 + years If all above answers are "NO", may proceed with cephalosporin use.  . Cefoxitin Rash  . Estradiol Rash  . Estropipate Rash  . Olmesartan Medoxomil-Hctz Rash  . Sertraline Rash       . Sulfa Antibiotics Rash     Past Medical History:  Diagnosis Date  . Anxiety   . Arthritis   . GERD (gastroesophageal reflux disease)   . History of ischemic colitis 09/02/2017  . Hx of colonic polyp   . Hypertension    H/O-PT TAKEN OFF BY PCP AS OF 2019  . IBS (irritable bowel syndrome)   . Ovarian cyst   . Vaginal atrophy      Past Surgical History:  Procedure Laterality Date  . BREAST BIOPSY Left    needle bx/clip-neg  . CERVICAL BIOPSY  W/ LOOP ELECTRODE EXCISION    . CHOLECYSTECTOMY    . Colonoscopoy  2007, 2008, 2011  . COLONOSCOPY WITH PROPOFOL N/A 05/05/2018   Procedure: COLONOSCOPY WITH PROPOFOL;  Surgeon: Lin Landsman, MD;  Location: [facility];  Service: Gastroenterology;  Laterality: N/A;  . HYSTERECTOMY ABDOMINAL WITH SALPINGO-OOPHORECTOMY N/A 08/19/2019   Procedure: HYSTERECTOMY ABDOMINAL WITH SALPINGO-OOPHORECTOMY WITH PELVIC WASHINGS PERITONEAL BIOPSIES AND PARA AORTIC LYMPH NODE DISSECTION;  Surgeon: [name], MD;  Location: [facility] ORS;  Service: Gynecology;  Laterality: N/A;  . LEEP    . OMENTECTOMY N/A 08/19/2019   Procedure:  OMENTECTOMY;  Surgeon: [name], MD;  Location: [facility] ORS;  Service: Gynecology;  Laterality: N/A;  . TUBAL LIGATION  1999    Social History   Socioeconomic History  . Marital status: Single    Spouse name: Not on file  . Number of children: 0  . Years of education: Not on file  . Highest education level: Associate degree: academic program  Occupational History  . Occupation: Retired  Tobacco Use  . Smoking status: Never Smoker  . Smokeless tobacco: Never Used  . Tobacco comment: smoking cessation materials not required  Substance and Sexual Activity  . Alcohol use: Yes    Comment: rarely  . Drug use: No  . Sexual activity: Never    Birth control/protection: Post-menopausal  Other Topics Concern  . Not on file  Social History Narrative  . Not on file   Social Determinants of Health   Financial Resource Strain:   . Difficulty of Paying Living Expenses: Not on file  Food Insecurity:   . Worried About Charity fundraiser in the Last Year: Not on file  . Ran Out of Food in the Last Year: Not on  file  Transportation Needs:   . Film/video editor (Medical): Not on file  . Lack of Transportation (Non-Medical): Not on file  Physical Activity:   . Days of Exercise per Week: Not on file  . Minutes of Exercise per Session: Not on file  Stress:   . Feeling of Stress : Not on file  Social Connections:   . Frequency of Communication with Friends and Family: Not on file  . Frequency of Social Gatherings with Friends and Family: Not on file  . Attends Religious Services: Not on file  . Active Member of Clubs or Organizations: Not on file  . Attends Archivist Meetings: Not on file  . Marital Status: Not on file  Intimate Partner Violence:   . Fear of Current or Ex-Partner: Not on file  . Emotionally Abused: Not on file  . Physically Abused: Not on file  . Sexually Abused: Not on file    Family History  Problem Relation Age of Onset  . Heart failure  Mother   . Atrial fibrillation Mother   . Hypertension Mother   . Diabetes Father   . Heart attack Father   . Breast cancer Maternal Aunt 70  . Breast cancer Maternal Grandmother 80  . Breast cancer Cousin        2 mat cousins  . Hypertension Sister   . Hypertension Sister   . Arthritis Sister   . Leukemia Paternal Aunt   . Leukemia Paternal Uncle   . Brain cancer Paternal Aunt   . Prostate cancer Neg Hx   . Kidney cancer Neg Hx      Current Outpatient Medications:  .  enoxaparin (LOVENOX) 40 MG/0.4ML injection, Inject 0.4 mLs (40 mg total) into the skin daily. (Patient taking differently: Inject 40 mg into the skin daily at 12 noon. ), Disp: 8.8 mL, Rfl: 0 .  omeprazole (PRILOSEC) 20 MG capsule, Take 20 mg by mouth daily as needed (acid reflux)., Disp: , Rfl:  .  oxyCODONE-acetaminophen (PERCOCET/ROXICET) 5-325 MG tablet, Take 1-2 tablets by mouth every 4 (four) hours as needed for moderate pain., Disp: 40 tablet, Rfl: 0 .  acetaminophen (TYLENOL) 500 MG tablet, Take 1,000 mg by mouth every 6 (six) hours as needed (for pain.)., Disp: , Rfl:  .  amitriptyline (ELAVIL) 25 MG tablet, TAKE 1 TABLET BY MOUTH EVERYDAY AT BEDTIME (Patient taking differently: Take 25 mg by mouth at bedtime. ), Disp: 90 tablet, Rfl: 0 .  benzocaine-resorcinol (VAGISIL) 5-2 % vaginal cream, Place 1 application vaginally daily as needed for itching or irritation., Disp: , Rfl:  .  cetirizine (ZYRTEC) 10 MG tablet, Take 10 mg by mouth daily as needed (allergies.). , Disp: , Rfl:  .  dexamethasone (DECADRON) 4 MG tablet, Take 2 tablets (8 mg total) by mouth daily. Start the day after carboplatin chemotherapy for 3 days., Disp: 30 tablet, Rfl: 1 .  diphenhydrAMINE (BENADRYL) 25 MG tablet, Take 25 mg by mouth daily as needed for itching or allergies., Disp: , Rfl:  .  docusate sodium (COLACE) 100 MG capsule, Take 1 capsule (100 mg total) by mouth 2 (two) times daily. (Patient not taking: Reported on 09/07/2019), Disp:  10 capsule, Rfl: 0 .  fluticasone (FLONASE) 50 MCG/ACT nasal spray, Place 2 sprays into both nostrils daily. (Patient taking differently: Place 2 sprays into both nostrils daily as needed (allergies.). ), Disp: 16 g, Rfl: 0 .  guaiFENesin (MUCINEX) 600 MG 12 hr tablet, Take 600 mg by mouth  at bedtime as needed (congestion)., Disp: , Rfl:  .  hydrochlorothiazide (HYDRODIURIL) 25 MG tablet, Take 1 tablet (25 mg total) by mouth daily. (Patient taking differently: Take 25 mg by mouth daily at 12 noon. ), Disp: 30 tablet, Rfl: 0 .  hydrocortisone cream 1 %, Apply 1 application topically daily as needed for itching., Disp: , Rfl:  .  ibuprofen (ADVIL) 600 MG tablet, Take 1 tablet (600 mg total) by mouth every 6 (six) hours as needed for fever or headache. (Patient not taking: Reported on 09/07/2019), Disp: 30 tablet, Rfl: 0 .  lidocaine-prilocaine (EMLA) cream, Apply to affected area once (Patient taking differently: Apply 1 application topically daily as needed (prior to port being accessed.). ), Disp: 30 g, Rfl: 3 .  LORazepam (ATIVAN) 0.5 MG tablet, Take 1 tablet (0.5 mg total) by mouth every 6 (six) hours as needed (Nausea or vomiting). (Patient taking differently: Take 0.5 mg by mouth every 6 (six) hours as needed (Nausea or vomiting). Nausea/vomiting), Disp: 30 tablet, Rfl: 0 .  ondansetron (ZOFRAN) 8 MG tablet, Take 1 tablet (8 mg total) by mouth 2 (two) times daily as needed for refractory nausea / vomiting. Start on day 3 after carboplatin chemo., Disp: 30 tablet, Rfl: 1 .  prochlorperazine (COMPAZINE) 10 MG tablet, Take 1 tablet (10 mg total) by mouth every 6 (six) hours as needed (Nausea or vomiting). (Patient taking differently: Take 10 mg by mouth every 6 (six) hours as needed (Nausea or vomiting). Nausea/vomiting), Disp: 30 tablet, Rfl: 1 .  psyllium (METAMUCIL) 0.52 g capsule, Take 0.52 g by mouth in the morning and at bedtime., Disp: , Rfl:  .  senna (SENOKOT) 8.6 MG tablet, Take 1 tablet by  mouth daily., Disp: , Rfl:  .  traMADol (ULTRAM) 50 MG tablet, Take 1 tablet (50 mg total) by mouth every 6 (six) hours as needed. (Patient taking differently: Take 50 mg by mouth every 6 (six) hours as needed (for pain.). ), Disp: 60 tablet, Rfl: 0 No current facility-administered medications for this visit.  Facility-Administered Medications Ordered in Other Visits:  .  clindamycin (CLEOCIN) IVPB 300 mg, 300 mg, Intravenous, Once, Kris Hartmann, NP  Physical exam:  Vitals:   09/09/19 0923  BP: 127/64  Pulse: 62  Resp: (!) 24  Temp: (!) 97.4 F (36.3 C)  TempSrc: Tympanic  SpO2: 100%   Physical Exam Vitals reviewed.  Constitutional:      Appearance: Normal appearance.  HENT:     Head: Normocephalic and atraumatic.  Eyes:     Pupils: Pupils are equal, round, and reactive to light.  Cardiovascular:     Rate and Rhythm: Normal rate and regular rhythm.     Heart sounds: Normal heart sounds. No murmur.  Pulmonary:     Effort: Pulmonary effort is normal.     Breath sounds: Normal breath sounds. No wheezing.  Abdominal:     General: Bowel sounds are normal. There is distension.     Palpations: Abdomen is soft.     Tenderness: There is no abdominal tenderness.  Musculoskeletal:        General: Tenderness (Right flank pain) present. Normal range of motion.     Cervical back: Normal range of motion.  Skin:    General: Skin is warm and dry.     Findings: No rash.  Neurological:     Mental Status: She is alert and oriented to person, place, and time.  Psychiatric:  Judgment: Judgment normal.      CMP Latest Ref Rng & Units 08/22/2019  Glucose 70 - 99 mg/dL 92  BUN 8 - 23 mg/dL 7(L)  Creatinine 0.44 - 1.00 mg/dL 0.50  Sodium 135 - 145 mmol/L 137  Potassium 3.5 - 5.1 mmol/L 4.0  Chloride 98 - 111 mmol/L 105  CO2 22 - 32 mmol/L 23  Calcium 8.9 - 10.3 mg/dL 9.5  Total Protein 6.5 - 8.1 g/dL 6.1(L)  Total Bilirubin 0.3 - 1.2 mg/dL 1.1  Alkaline Phos 38 - 126 U/L  141(H)  AST 15 - 41 U/L 59(H)  ALT 0 - 44 U/L 140(H)   CBC Latest Ref Rng & Units 08/22/2019  WBC 4.0 - 10.5 K/uL 5.0  Hemoglobin 12.0 - 15.0 g/dL 10.6(L)  Hematocrit 36.0 - 46.0 % 32.7(L)  Platelets 150 - 400 K/uL 227    No images are attached to the encounter.  No results found.  Assessment and plan- Patient is a 74 y.o. female who presents to symptom management for complaints of constipation with nausea for approximately 2 days.  She was recently diagnosed with ovarian cancer and scheduled to attend chemo education today.  Unfortunately, she feels too ill to attend.  Ovarian cancer: Patient initially presented with pelvic pressure and recurrent UTI which prompted MRI of pelvis in December 2020 which showed a large cystic mass arising from the right ovary measuring 1.8 cm.  This was followed by a TA H/BSO with peritoneal biopsies with pelvic/aortic lymph node dissection pelvic washings and omentectomy with Dr. Fransisca Connors and Dr. Georgianne Fick on 08/19/2019.  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  Angiolymphatic invasion is present.  Bilateral fallopian tubes were negative for a atypia and malignancy.  Peritoneal stone incision negative with malignancy.  Uterus and cervix negative for atypia and malignancy.  5 lymph nodes were sampled and were negative for malignancy.  She was evaluated by Dr. Fransisca Connors postoperatively and his recommendation was to proceed with adjuvant chemotherapy carbo Taxol for 6 cycles.  She is scheduled to begin next week.  Constipation: Appears to be a chronic problem. She was recently started on Percocet 11/23/2023 1 to 2 tablets by mouth every 4 hours as needed for pain and she unfortunately did not start on bowel prophylaxis at that time.  Her hydration status is not great.  Her abdomen is soft although she feels bloated.  During assessment, I auscultated bowel sounds.  This is unlikely an obstruction and will hydrate her today and start  her on a daily bowel regimen.  Plan: 1 L NaCl. 8 mg Zofran. Start MiraLAX twice daily. Begin taking Senokot once in the morning. Can try magnesium citrate this afternoon to stimulate a bowel movement. I have asked that she pick up her antinausea medicines that were called in by Dr. Janese Banks.  She should have 3 that include Compazine, Zofran and Ativan.  We talked at length about when to take which medication. She would like to stop taking her Percocet and try something milder. Rx tramadol 50 mg every 6 hours as needed for pain was prescribed. Instructed her to stay hydrated. Drink plenty of water.   Disposition: RTC on Friday for port placement. RTC on 09/15/2019 for cycle 1 carbo/Taxol. I will touch base with patient on Thursday or Friday to see how she is feeling and if she has had a bowel movement.  Patient to call clinic with any questions or concerns.  Addendum: Spoke to patient on  09/11/2019 to follow-up.  Patient states that she had to call on-call clinic on Wednesday evening because she developed projectile vomiting after starting her magnesium citrate.  She states she was able to keep down the bottle for approximately 1-1/2 hours and then she had several episodes of projectile vomiting.  She did have a large loose bowel movement that evening.  She was instructed by on-call physician to began new bowel regimen starting yesterday.  Patient took 2 doses of MiraLAX and 1 dose of Senokot and has had 2 additional loose bowel movements.  I recommend she cut back and only take MiraLAX and Senokot once daily to see if this can keep her regular and is tolerable.  She agreed.  She also has tried the tramadol and she states it "does the job but is not as good as the other medication".  She is agreeable to continue tramadol.   Visit Diagnosis 1. Ovarian cancer on right [facility])   2. Dehydration     Patient expressed understanding and was in agreement with this plan. She also understands that She can call  clinic at any time with any questions, concerns, or complaints.   Greater than 50% was spent in counseling and coordination of care with this patient including but not limited to discussion of the relevant topics above (See A&P) including, but not limited to diagnosis and management of acute and chronic medical conditions.   Thank you for allowing me to participate in the care of this very pleasant patient.    Jacquelin Hawking, NP [place] [facility] Cell - [phone] Pager- [phone] 09/11/2019 11:02 AM

## 2019-09-09 NOTE — Progress Notes (Signed)
Met with Ms. Winstanley and her niece following chemo class/[facility] visit. Went over CT scan CAP appointment details as well as Dietitian virtual visit. Provided these in AVS.

## 2019-09-09 NOTE — Patient Instructions (Signed)
I am so sorry you are not feeling well today.  Today while you are in our clinic we gave you 1 L of normal saline to help keep you hydrated and hopefully produce a good bowel movement.  We also gave you some nausea medicine.  The name of it is Zofran.  Dr. Janese Banks has called you in several prescriptions including Zofran, Compazine and Ativan.  The Zofran and Compazine are great medications to help control your nausea.  Ativan can be used for nausea and also for anxiety.  I believe a lot of your constipation is due to the pain medicine that you are taking.  We will start you on a bowel regimen to try to produce normal daily bowel movements.  I know it is tough with your history of IBS and recent surgery but we will play with the medications to see what is best for you.  I would recommend starting with MiraLAX and Senokot every morning.  I would also recommend a MiraLAX at bedtime.  You may not get immediate results so you can try magnesium citrate again to get immediate results.  Of course at any time, back off on medications if you develop diarrhea.  If you do develop diarrhea stop the Senokot first and continue the MiraLAX.   For nausea, you can take the Zofran or Compazine after you pick it up at the pharmacy today.  You can use the Ativan at bedtime to help you sleep and also with anxiety.  Stop taking your current pain medication.  That would be your oxycodone or Percocet.  We can switch you to tramadol which is better for your bowels.  I have sent that prescription to your pharmacy as well.  Make sure that you stay adequately hydrated.  This also can be helpful to produce bowel movements.  I hope you are feeling better.  None of this should affect your port placement on Friday.  Please let me know if you develop any pain in your abdomen or have persistent or worsening nausea and vomiting.  Constipation, Adult Constipation is when a person:  Poops (has a bowel movement) fewer times in a week than  normal.  Has a hard time pooping.  Has poop that is dry, hard, or bigger than normal. Follow these instructions at home: Eating and drinking   Eat foods that have a lot of fiber, such as: ? Fresh fruits and vegetables. ? Whole grains. ? Beans.  Eat less of foods that are high in fat, low in fiber, or overly processed, such as: ? [place] fries. ? Hamburgers. ? Cookies. ? Candy. ? Soda.  Drink enough fluid to keep your pee (urine) clear or pale yellow. General instructions  Exercise regularly or as told by your doctor.  Go to the restroom when you feel like you need to poop. Do not hold it in.  Take over-the-counter and prescription medicines only as told by your doctor. These include any fiber supplements.  Do pelvic floor retraining exercises, such as: ? Doing deep breathing while relaxing your lower belly (abdomen). ? Relaxing your pelvic floor while pooping.  Watch your condition for any changes.  Keep all follow-up visits as told by your doctor. This is important. Contact a doctor if:  You have pain that gets worse.  You have a fever.  You have not pooped for 4 days.  You throw up (vomit).  You are not hungry.  You lose weight.  You are bleeding from the anus.  You have thin, pencil-like poop (stool). Get help right away if:  You have a fever, and your symptoms suddenly get worse.  You leak poop or have blood in your poop.  Your belly feels hard or bigger than normal (is bloated).  You have very bad belly pain.  You feel dizzy or you faint. This information is not intended to replace advice given to you by your health care provider. Make sure you discuss any questions you have with your health care provider. Document Revised: 06/21/2017 Document Reviewed: 12/28/2015 Elsevier Patient Education  2020 [place] can call us at [phone].  Faythe Casa, NP 09/09/2019 11:05 AM

## 2019-09-10 LAB — SARS CORONAVIRUS 2 (TAT 6-24 HRS): SARS Coronavirus 2: NEGATIVE

## 2019-09-10 MED ORDER — CLINDAMYCIN PHOSPHATE 300 MG/50ML IV SOLN: 300.0000 mg | Freq: Once | INTRAVENOUS | Status: AC

## 2019-09-11 ENCOUNTER — Ambulatory Visit
Admission: RE | Admit: 2019-09-11 | Discharge: 2019-09-11 | Disposition: A | Discharge status: Home / Self Care | Payer: Medicare Other | Attending: Vascular Surgery | Admitting: Vascular Surgery

## 2019-09-11 ENCOUNTER — Ambulatory Visit: Payer: Medicare Other | Admitting: Obstetrics and Gynecology

## 2019-09-11 ENCOUNTER — Inpatient Hospital Stay (HOSPITAL_BASED_OUTPATIENT_CLINIC_OR_DEPARTMENT_OTHER): Payer: Medicare Other | Admitting: Oncology

## 2019-09-11 ENCOUNTER — Other Ambulatory Visit: Payer: Self-pay

## 2019-09-11 ENCOUNTER — Encounter: Payer: Self-pay | Admitting: Vascular Surgery

## 2019-09-11 ENCOUNTER — Encounter
Admission: RE | Disposition: A | Discharge status: Home / Self Care | Payer: Self-pay | Source: Home / Self Care | Attending: Vascular Surgery

## 2019-09-11 DIAGNOSIS — Z79899 Other long term (current) drug therapy: Secondary | ICD-10-CM | POA: Diagnosis not present

## 2019-09-11 DIAGNOSIS — I10 Essential (primary) hypertension: Secondary | ICD-10-CM | POA: Diagnosis not present

## 2019-09-11 DIAGNOSIS — C561 Malignant neoplasm of right ovary: Secondary | ICD-10-CM

## 2019-09-11 DIAGNOSIS — K219 Gastro-esophageal reflux disease without esophagitis: Secondary | ICD-10-CM | POA: Insufficient documentation

## 2019-09-11 DIAGNOSIS — E860 Dehydration: Secondary | ICD-10-CM | POA: Insufficient documentation

## 2019-09-11 DIAGNOSIS — K589 Irritable bowel syndrome without diarrhea: Secondary | ICD-10-CM | POA: Diagnosis not present

## 2019-09-11 DIAGNOSIS — C569 Malignant neoplasm of unspecified ovary: Secondary | ICD-10-CM

## 2019-09-11 DIAGNOSIS — D4959 Neoplasm of unspecified behavior of other genitourinary organ: Secondary | ICD-10-CM | POA: Diagnosis not present

## 2019-09-11 DIAGNOSIS — F419 Anxiety disorder, unspecified: Secondary | ICD-10-CM | POA: Diagnosis not present

## 2019-09-11 HISTORY — PX: PORTA CATH INSERTION: CATH118285

## 2019-09-11 HISTORY — DX: Malignant neoplasm of unspecified ovary: C56.9

## 2019-09-11 SURGERY — PORTA CATH INSERTION
Anesthesia: Moderate Sedation

## 2019-09-11 MED ORDER — FENTANYL CITRATE (PF) 100 MCG/2ML IJ SOLN
INTRAMUSCULAR | Status: AC
  Filled 2019-09-11: qty 2

## 2019-09-11 MED ORDER — METHYLPREDNISOLONE SODIUM SUCC 125 MG IJ SOLR: 125.0000 mg | Freq: Once | INTRAMUSCULAR | Status: DC | PRN

## 2019-09-11 MED ORDER — ONDANSETRON HCL 4 MG/2ML IJ SOLN
4.0000 mg | Freq: Four times a day (QID) | INTRAMUSCULAR | Status: DC | PRN
  Administered 2019-09-11: 14:00:00 4 mg via INTRAVENOUS

## 2019-09-11 MED ORDER — CLINDAMYCIN PHOSPHATE 300 MG/50ML IV SOLN
INTRAVENOUS | Status: AC
  Administered 2019-09-11: 300 mg via INTRAVENOUS
  Filled 2019-09-11: qty 50

## 2019-09-11 MED ORDER — FENTANYL CITRATE (PF) 100 MCG/2ML IJ SOLN
INTRAMUSCULAR | Status: DC | PRN
  Administered 2019-09-11: 50 ug via INTRAVENOUS

## 2019-09-11 MED ORDER — SODIUM CHLORIDE 0.9 % IV SOLN
Freq: Once | INTRAVENOUS | Status: DC
  Filled 2019-09-11 (×2): qty 2

## 2019-09-11 MED ORDER — SODIUM CHLORIDE 0.9 % IV SOLN: INTRAVENOUS | Status: DC

## 2019-09-11 MED ORDER — FAMOTIDINE 20 MG PO TABS
ORAL_TABLET | ORAL | Status: AC
  Filled 2019-09-11: qty 2

## 2019-09-11 MED ORDER — MIDAZOLAM HCL 2 MG/2ML IJ SOLN
INTRAMUSCULAR | Status: DC | PRN
  Administered 2019-09-11: 2 mg via INTRAVENOUS

## 2019-09-11 MED ORDER — DIPHENHYDRAMINE HCL 50 MG/ML IJ SOLN
INTRAMUSCULAR | Status: AC
  Filled 2019-09-11: qty 1

## 2019-09-11 MED ORDER — DIPHENHYDRAMINE HCL 50 MG/ML IJ SOLN: 50.0000 mg | Freq: Once | INTRAMUSCULAR | Status: DC | PRN

## 2019-09-11 MED ORDER — ONDANSETRON HCL 4 MG/2ML IJ SOLN
INTRAMUSCULAR | Status: AC
  Filled 2019-09-11: qty 2

## 2019-09-11 MED ORDER — HYDROMORPHONE HCL 1 MG/ML IJ SOLN: 1.0000 mg | Freq: Once | INTRAMUSCULAR | Status: DC | PRN

## 2019-09-11 MED ORDER — LIDOCAINE-EPINEPHRINE (PF) 1 %-1:200000 IJ SOLN
INTRAMUSCULAR | Status: AC
  Filled 2019-09-11: qty 20

## 2019-09-11 MED ORDER — MIDAZOLAM HCL 2 MG/2ML IJ SOLN
INTRAMUSCULAR | Status: AC
  Filled 2019-09-11: qty 2

## 2019-09-11 MED ORDER — FAMOTIDINE 20 MG PO TABS: 40.0000 mg | ORAL_TABLET | Freq: Once | ORAL | Status: DC | PRN

## 2019-09-11 MED ORDER — METHYLPREDNISOLONE SODIUM SUCC 125 MG IJ SOLR
INTRAMUSCULAR | Status: AC
  Filled 2019-09-11: qty 2

## 2019-09-11 MED ORDER — MIDAZOLAM HCL 2 MG/ML PO SYRP: 8.0000 mg | ORAL_SOLUTION | Freq: Once | ORAL | Status: DC | PRN

## 2019-09-11 SURGICAL SUPPLY — 14 items
"PENCIL ELECTRO HAND CTR " (MISCELLANEOUS) ×1 IMPLANT
CANNULA 5F STIFF (CANNULA) ×1 IMPLANT
DERMABOND ADVANCED (GAUZE/BANDAGES/DRESSINGS) ×1
DERMABOND ADVANCED .7 DNX12 (GAUZE/BANDAGES/DRESSINGS) IMPLANT
ELECT REM PT RETURN 9FT ADLT (ELECTROSURGICAL) ×2
ELECTRODE REM PT RTRN 9FT ADLT (ELECTROSURGICAL) IMPLANT
KIT PORT POWER 8FR ISP CVUE (Port) ×1 IMPLANT
PACK ANGIOGRAPHY (CUSTOM PROCEDURE TRAY) ×2 IMPLANT
PENCIL ELECTRO HAND CTR (MISCELLANEOUS) ×4 IMPLANT
SPONGE XRAY 4X4 16PLY STRL (MISCELLANEOUS) ×1 IMPLANT
SUT MNCRL AB 4-0 PS2 18 (SUTURE) ×2 IMPLANT
SUT PROLENE 0 CT 1 30 (SUTURE) ×2 IMPLANT
SUT VIC AB 3-0 SH 27 (SUTURE) ×1
SUT VIC AB 3-0 SH 27X BRD (SUTURE) IMPLANT

## 2019-09-11 NOTE — Op Note (Signed)
      [facility]       Operative Note  Date: 09/11/2019  Preoperative diagnosis:  1. Ovarian cancer  Postoperative diagnosis:  Same as above  Procedures: #1. Ultrasound guidance for vascular access to the right internal jugular vein. #2. Fluoroscopic guidance for placement of catheter. #3. Placement of CT compatible Port-A-Cath, right internal jugular vein.  Surgeon: Leotis Pain, MD.   Anesthesia: Local with moderate conscious sedation for approximately 15  minutes using 2 mg of Versed and 50 mcg of Fentanyl  Fluoroscopy time: less than 1 minute  Contrast used: 0  Estimated blood loss: 3 cc  Indication for the procedure:  The patient is a 74 y.o.female with ovarian cancer.  The patient needs a Port-A-Cath for durable venous access, chemotherapy, lab draws, and CT scans. We are asked to place this. Risks and benefits were discussed and informed consent was obtained.  Description of procedure: The patient was brought to the vascular and interventional radiology suite.  Moderate conscious sedation was administered throughout the procedure during a face to face encounter with the patient with my supervision of the RN administering medicines and monitoring the patient's vital signs, pulse oximetry, telemetry and mental status throughout from the start of the procedure until the patient was taken to the recovery room. The right neck chest and shoulder were sterilely prepped and draped, and a sterile surgical field was created. Ultrasound was used to help visualize a patent right internal jugular vein. This was then accessed under direct ultrasound guidance without difficulty with the Seldinger needle and a permanent image was recorded. A J-wire was placed. After skin nick and dilatation, the peel-away sheath was then placed over the wire. I then anesthetized an area under the clavicle approximately 1-2 fingerbreadths. A transverse incision was created and an inferior pocket  was created with electrocautery and blunt dissection. The port was then brought onto the field, placed into the pocket and secured to the chest wall with 2 Prolene sutures. The catheter was connected to the port and tunneled from the subclavicular incision to the access site. Fluoroscopic guidance was then used to cut the catheter to an appropriate length. The catheter was then placed through the peel-away sheath and the peel-away sheath was removed. The catheter tip was parked in excellent location under fluorocoscopic guidance in the cavoatrial junction. The pocket was then irrigated with antibiotic impregnated saline and the wound was closed with a running 3-0 Vicryl and a 4-0 Monocryl. The access incision was closed with a single 4-0 Monocryl. The Huber needle was used to withdraw blood and flush the port with heparinized saline. Dermabond was then placed as a dressing. The patient tolerated the procedure well and was taken to the recovery room in stable condition.   Leotis Pain 09/11/2019 4:00 PM   This note was created with Dragon Medical transcription system. Any errors in dictation are purely unintentional.

## 2019-09-11 NOTE — H&P (Signed)
[facility] History & Physical Update  The patient was interviewed and re-examined.  The patient's previous History and Physical has been reviewed and is unchanged.  There is no change in the plan of care. We plan to proceed with the scheduled procedure.  Leotis Pain, MD  09/11/2019, 4:00 PM

## 2019-09-11 NOTE — Progress Notes (Addendum)
[facility]  [phone] [phone]  Patient Care Team: Juline Patch, MD as PCP - General (Family Medicine) Colvin Caroli, MD as Consulting Physician (Family Medicine) Lin Landsman, MD as Consulting Physician (Gastroenterology) Clent Jacks, RN as Oncology Nurse Navigator   Name of the patient: Carrie Roberson  [id number]  07/25/1945   Date of visit: 09/11/19  Diagnosis- Ovarian cancer   Chief complaint/Reason for visit- Initial Meeting for [facility], preparing for starting chemotherapy  I connected with [name] on 09/11/19 at 10:30 AM EST by telephone visit and verified that I am speaking with the correct person using two identifiers.   I discussed the limitations, risks, security and privacy concerns of performing an evaluation and management service by telemedicine and the availability of in-person appointments. I also discussed with the patient that there may be a patient responsible charge related to this service. The patient expressed understanding and agreed to proceed.   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[place])  08/19/2019 Initial Diagnosis   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[place])   09/07/2019 Cancer Staging   Staging form: Ovary, Fallopian Tube, and Primary Peritoneal Carcinoma, AJCC 8th Edition - Pathologic stage from 09/07/2019: FIGO Stage IC2, calculated as Stage IC (pT1c2, pN0, cM0) - Signed by Sindy Guadeloupe, MD on 09/07/2019   09/15/2019 -  Chemotherapy   The patient had palonosetron (ALOXI) injection 0.25 mg, 0.25 mg, Intravenous,  Once, 0 of 6 cycles pegfilgrastim (NEULASTA ONPRO KIT) injection 6 mg, 6 mg, Subcutaneous, Once, 0 of 6 cycles CARBOplatin (PARAPLATIN) 440 mg in sodium chloride 0.9 %  250 mL chemo infusion, 440 mg (original dose ), Intravenous,  Once, 0 of 6 cycles Dose modification:   (Cycle 1) fosaprepitant (EMEND) 150 mg in sodium chloride 0.9 % 145 mL IVPB, 150 mg, Intravenous,  Once, 0 of 6 cycles PACLitaxel (TAXOL) 336 mg in sodium chloride 0.9 % 500 mL chemo infusion (> 2m/m2), 175 mg/m2, Intravenous,  Once, 0 of 6 cycles  for chemotherapy treatment.      Interval history-Carrie NSalsberryis a 74year old female who presents to chemo care clinic today for initial meeting in preparation for starting chemotherapy. I introduced the chemo care clinic and we discussed that the role of the clinic is to assist those who are at an increased risk of emergency room visits and/or complications during the course of chemotherapy treatment. We discussed that the increased risk takes into account factors such as age, performance status, and co-morbidities. We also discussed that for some, this might include barriers to care such as not having a primary care provider, lack of insurance/transportation, or not being able to afford medications. We discussed that the goal of the program is to help prevent unplanned ER visits and help reduce complications during chemotherapy. We do this by discussing specific risk factors to each individual and identifying ways that we can help improve these risk factors and reduce barriers to care.   ECOG FS:0 - Asymptomatic  Review of systems- Review of Systems  Constitutional: Negative.  Negative for chills, fever, malaise/fatigue and weight loss.  HENT: Negative for congestion, ear pain and tinnitus.   Eyes: Negative.  Negative for blurred vision and double vision.  Respiratory: Negative.  Negative for cough, sputum production and shortness of breath.  Cardiovascular: Negative.  Negative for chest pain, palpitations and leg swelling.  Gastrointestinal: Negative.  Negative for abdominal pain, constipation, diarrhea, nausea and vomiting.  Genitourinary:  Negative for dysuria, frequency and urgency.  Musculoskeletal: Positive for back pain. Negative for falls.  Skin: Negative.  Negative for rash.  Neurological: Negative.  Negative for weakness and headaches.  Endo/Heme/Allergies: Negative.  Does not bruise/bleed easily.  Psychiatric/Behavioral: Negative for depression. The patient is nervous/anxious. The patient does not have insomnia.      Current treatment-beginning treatment next week   Allergies  Allergen Reactions   Citalopram Nausea And Vomiting   Ivp Dye [Iodinated Diagnostic Agents] Hives and Swelling    hives   Meloxicam     unknown   Adhesive [Tape] Rash   Amlodipine Rash   Ampicillin Rash    Did it involve swelling of the face/tongue/throat, SOB, or low BP? No Did it involve sudden or severe rash/hives, skin peeling, or any reaction on the inside of your mouth or nose? Yes Did you need to seek medical attention at a hospital or doctor's office? Yes When did it last happen? 10 + years If all above answers are NO, may proceed with cephalosporin use.   Cefoxitin Rash   Estradiol Rash   Estropipate Rash   Olmesartan Medoxomil-Hctz Rash   Sertraline Rash        Sulfa Antibiotics Rash    Past Medical History:  Diagnosis Date   Anxiety    Arthritis    GERD (gastroesophageal reflux disease)    History of ischemic colitis 09/02/2017   Hx of colonic polyp    Hypertension    H/O-PT TAKEN OFF BY PCP AS OF 2019   IBS (irritable bowel syndrome)    Ovarian cancer ([place])    Ovarian cyst    Vaginal atrophy     Past Surgical History:  Procedure Laterality Date   BREAST BIOPSY Left    needle bx/clip-neg   CERVICAL BIOPSY  W/ LOOP ELECTRODE EXCISION     CHOLECYSTECTOMY     Colonoscopoy  2007, 2008, 2011   COLONOSCOPY WITH PROPOFOL N/A 05/05/2018   Procedure: COLONOSCOPY WITH PROPOFOL;  Surgeon: Lin Landsman, MD;  Location: [facility] ENDOSCOPY;  Service: Gastroenterology;  Laterality:  N/A;   HYSTERECTOMY ABDOMINAL WITH SALPINGO-OOPHORECTOMY N/A 08/19/2019   Procedure: HYSTERECTOMY ABDOMINAL WITH SALPINGO-OOPHORECTOMY WITH PELVIC WASHINGS PERITONEAL BIOPSIES AND PARA AORTIC LYMPH NODE DISSECTION;  Surgeon: [name], MD;  Location: [facility] ORS;  Service: Gynecology;  Laterality: N/A;   LEEP     OMENTECTOMY N/A 08/19/2019   Procedure: OMENTECTOMY;  Surgeon: [name], MD;  Location: [facility] ORS;  Service: Gynecology;  Laterality: N/A;   TUBAL LIGATION  1999    Social History   Socioeconomic History   Marital status: Single    Spouse name: Not on file   Number of children: 0   Years of education: Not on file   Highest education level: Associate degree: academic program  Occupational History   Occupation: Retired  Tobacco Use   Smoking status: Never Smoker   Smokeless tobacco: Never Used   Tobacco comment: smoking cessation materials not required  Substance and Sexual Activity   Alcohol use: Yes    Comment: rarely   Drug use: No   Sexual activity: Never    Birth control/protection: Post-menopausal  Other Topics Concern   Not on file  Social History Narrative   Not on file   Social Determinants of Health   Financial Resource Strain:  Difficulty of Paying Living Expenses: Not on file  Food Insecurity:    Worried About [place] in the Last Year: Not on file   Ran Out of Food in the Last Year: Not on file  Transportation Needs:    Lack of Transportation (Medical): Not on file   Lack of Transportation (Non-Medical): Not on file  Physical Activity:    Days of Exercise per Week: Not on file   Minutes of Exercise per Session: Not on file  Stress:    Feeling of Stress : Not on file  Social Connections:    Frequency of Communication with Friends and Family: Not on file   Frequency of Social Gatherings with Friends and Family: Not on file   Attends Religious Services: Not on file   Active Member of Clubs or  Organizations: Not on file   Attends Archivist Meetings: Not on file   Marital Status: Not on file  Intimate Partner Violence:    Fear of Current or Ex-Partner: Not on file   Emotionally Abused: Not on file   Physically Abused: Not on file   Sexually Abused: Not on file    Family History  Problem Relation Age of Onset   Heart failure Mother    Atrial fibrillation Mother    Hypertension Mother    Diabetes Father    Heart attack Father    Breast cancer Maternal Aunt 35   Breast cancer Maternal Grandmother 65   Breast cancer Cousin        2 mat cousins   Hypertension Sister    Hypertension Sister    Arthritis Sister    Leukemia Paternal Aunt    Leukemia Paternal Uncle    Brain cancer Paternal Aunt    Prostate cancer Neg Hx    Kidney cancer Neg Hx     No current facility-administered medications for this visit. No current outpatient medications on file.  Facility-Administered Medications Ordered in Other Visits:    0.9 %  sodium chloride infusion, , Intravenous, Continuous, Kris Hartmann, NP, Last Rate: 75 mL/hr at 09/11/19 1352, New Bag at 09/11/19 1352   clindamycin (CLEOCIN) 300 MG/50ML IVPB, , , ,    clindamycin (CLEOCIN) IVPB 300 mg, 300 mg, Intravenous, Once, Kris Hartmann, NP   diphenhydrAMINE (BENADRYL) injection 50 mg, 50 mg, Intravenous, Once PRN, Kris Hartmann, NP   famotidine (PEPCID) tablet 40 mg, 40 mg, Oral, Once PRN, Kris Hartmann, NP   HYDROmorphone (DILAUDID) injection 1 mg, 1 mg, Intravenous, Once PRN, Kris Hartmann, NP   methylPREDNISolone sodium succinate (SOLU-MEDROL) 125 mg/2 mL injection 125 mg, 125 mg, Intravenous, Once PRN, Kris Hartmann, NP   midazolam (VERSED) 2 MG/ML syrup 8 mg, 8 mg, Oral, Once PRN, Kris Hartmann, NP   ondansetron [facility]) 4 MG/2ML injection, , , ,    ondansetron (ZOFRAN) injection 4 mg, 4 mg, Intravenous, Q6H PRN, Kris Hartmann, NP, 4 mg at 09/11/19 1354  Physical exam:  There were no vitals filed for this visit. Limited due to virtual setting  CMP Latest Ref Rng & Units 08/22/2019  Glucose 70 - 99 mg/dL 92  BUN 8 - 23 mg/dL 7(L)  Creatinine 0.44 - 1.00 mg/dL 0.50  Sodium 135 - 145 mmol/L 137  Potassium 3.5 - 5.1 mmol/L 4.0  Chloride 98 - 111 mmol/L 105  CO2 22 - 32 mmol/L 23  Calcium 8.9 - 10.3 mg/dL 9.5  Total Protein 6.5 - 8.1 g/dL  6.1(L)  Total Bilirubin 0.3 - 1.2 mg/dL 1.1  Alkaline Phos 38 - 126 U/L 141(H)  AST 15 - 41 U/L 59(H)  ALT 0 - 44 U/L 140(H)   CBC Latest Ref Rng & Units 08/22/2019  WBC 4.0 - 10.5 K/uL 5.0  Hemoglobin 12.0 - 15.0 g/dL 10.6(L)  Hematocrit 36.0 - 46.0 % 32.7(L)  Platelets 150 - 400 K/uL 227    No images are attached to the encounter.  No results found. Assessment and plan- Patient is a 74 y.o. female who presents to [facility] for initial meeting in preparation for starting chemotherapy for the treatment of ovarian cancer.    1. HPI: Patient initially presented with pelvic pressure and recurrent UTIs which prompted MRI of pelvis in December 2020 which showed a large cystic mass arising from the right ovary.  This was followed by a TAH/BSO with peritoneal biopsy and pelvic aortic lymph node dissection, pelvic washings and omentectomy with Dr. Fransisca Connors.  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  Angiolymphatic invasion is present.  Bilateral fallopian tubes, peritoneal, uterus and cervix were all negative for atypia or malignancy.  5 lymph nodes were sampled and were all negative for malignancy.   She is scheduled to begin treatment next week with cycle 1 carbo Taxol and have her port placed today.  She was recently evaluated in symptom management clinic for constipation and nausea.  She unfortunately has chronic bowel issues but has been able to have a few bowel movements since her visit.  She was started on a prophylactic bowel regimen including MiraLAX and Senokot daily.  Symptoms are  improving.  2. Chemo Care Clinic/High Risk for ER/Hospitalization during chemotherapy- We discussed the role of the chemo care clinic and identified patient specific risk factors. I discussed that patient was identified as high risk primarily based on stage of disease.   Patient has past medical history positive for: Past Medical History:  Diagnosis Date   Anxiety    Arthritis    GERD (gastroesophageal reflux disease)    History of ischemic colitis 09/02/2017   Hx of colonic polyp    Hypertension    H/O-PT TAKEN OFF BY PCP AS OF 2019   IBS (irritable bowel syndrome)    Ovarian cancer ([place])    Ovarian cyst    Vaginal atrophy     Patient has past surgical history positive for: Past Surgical History:  Procedure Laterality Date   BREAST BIOPSY Left    needle bx/clip-neg   CERVICAL BIOPSY  W/ LOOP ELECTRODE EXCISION     CHOLECYSTECTOMY     Colonoscopoy  2007, 2008, 2011   COLONOSCOPY WITH PROPOFOL N/A 05/05/2018   Procedure: COLONOSCOPY WITH PROPOFOL;  Surgeon: Lin Landsman, MD;  Location: [facility] END[facility];  Service: Gastroenterology;  Laterality: N/A;   HYSTERECTOMY ABDOMINAL WITH SALPINGO-OOPHORECTOMY N/A 08/19/2019   Procedure: HYSTERECTOMY ABDOMINAL WITH SALPINGO-OOPHORECTOMY WITH PELVIC WASHINGS PERITONEAL BIOPSIES AND PARA AORTIC LYMPH NODE DISSECTION;  Surgeon: [name], MD;  Location: [facility] ORS;  Service: Gynecology;  Laterality: N/A;   LEEP     OMENTECTOMY N/A 08/19/2019   Procedure: OMENTECTOMY;  Surgeon: [name], MD;  Location: [facility] ORS;  Service: Gynecology;  Laterality: N/A;   TUBAL LIGATION  1999    Based on our high risk symptom management report; this patient has a high risk of ED utilization.  The percentage below indicates how "at risk "  this patient based on the factors in this table within one  year.   General Risk Score: 7  Values used to calculate this score:   Points  Metrics      1        Age: 13      3         Hospital Admissions: 3      3        ED Visits: 3      0        Has Chronic Obstructive Pulmonary Disease: No      0        Has Diabetes: No      0        Has Congestive Heart Failure: No      0        Has liver disease: No      0        Has Depression: No      0        Current PCP: Juline Patch, MD      0        Has Medicaid: No  At risk score- 73%   3. We discussed that social determinants of health may have significant impacts on health and outcomes for cancer patients.  Today we discussed specific social determinants of performance status, alcohol use, depression, financial needs, food insecurity, housing, interpersonal violence, social connections, stress, tobacco use, and transportation.    After lengthy discussion the following were identified as areas of need: None at this time.   Outpatient services: We discussed options including home based and outpatient services, DME and care program. We discusssed that patients who participate in regular physical activity report fewer negative impacts of cancer and treatments and report less fatigue.   Financial Concerns: We discussed that living with cancer can create tremendous financial burden.  We discussed options for assistance. I asked that if assistance is needed in affording medications or paying bills to please let us know so that we can provide assistance. We discussed options for food including social services.  We will also notify [facility] to see if cancer center can provide support.  Referral to Social work: Introduced Education officer, museum Elease Etienne and the services he can provide such as support with [employer], cell phone and gas vouchers.   Support groups: We discussed options for support groups at the cancer center. If interested, please notify nurse navigator to enroll. We discussed options for managing stress including healthy eating, exercise as well as participating in no charge counseling services at the cancer center and  support groups.  If these are of interest, patient can notify either myself or primary nursing team.We discussed options for management including medications and referral to quit Smart program  Transportation: We discussed options for transportation including acta, paratransit, bus routes, link transit, taxi/uber/lyft, and cancer center [place].  I have notified primary oncology team who will help assist with arranging Lucianne Lei transportation for appointments when/if needed. We also discussed options for transportation on short notice/acute visits.   Symptom Management Clinic: Discussed role of [facility] for acute issues such as nausea, vomiting, pain or diarrhea.  We also discussed method of contact either via telephone or my chart. She denies needing specific assistance at this time and She will be followed by Mariea Clonts, RN (Nurse Navigator).   Visit Diagnosis 1. Ovarian cancer on right [facility])     Patient expressed understanding and was in agreement with this plan. She also understands that She can call clinic  at any time with any questions, concerns, or complaints.   I provided 15 minutes of non face-to-face telephone visit time during this encounter, and > 50% was spent counseling as documented under my assessment & plan.  [place] [facility] [place]  CC: Dr. Janese Banks

## 2019-09-14 ENCOUNTER — Other Ambulatory Visit: Payer: Self-pay

## 2019-09-14 ENCOUNTER — Encounter: Payer: Self-pay | Admitting: Cardiology

## 2019-09-14 ENCOUNTER — Telehealth: Payer: Self-pay | Admitting: *Deleted

## 2019-09-14 NOTE — Progress Notes (Signed)
Patient stated that she had been doing well and that her port-a-cath is doing well. Patient is feeling anxious and nervous since she will start chemotherapy.

## 2019-09-14 NOTE — Telephone Encounter (Signed)
T/C made to patient Carrie Roberson this morning at Dr. Elroy Channel request to discuss the SWOG [phone] clinical trial she will be eligible for. Patient is scheduled to receive Carbo/Taxol infusions beginning tomorrow for her newly diagnosed Ovarian cancer, and if interested, she will need to consent for and be enrolled on the protocol tomorrow morning. After confirming her interest, a brief overview of he study was provided to [name]. She was informed of the potential risks and benefits as well as the voluntary nature of the study. Patient agreed to review the consent form today and a copy of the ICF was emailed to her. Plans made to contact her by phone later today to determine further interest and prepare for consent in the morning if the patient is interested. Yolande Jolly, BSN, MHA, OCN 09/14/2019 11:02 AM   T/C made back to [name] this afternoon to determine her interest in the SWOG S1714 neuropathy study. Patient declined participation at this time stating that, although she realizes it will probably benefit others someday, it is just too much for her right now. Patient thanked for considering clinical trial participation. Will approach for the DCP-001 questionnaire in about 3 weeks. Yolande Jolly, BSN, MHA, OCN 09/14/2019 3:21 PM

## 2019-09-15 ENCOUNTER — Other Ambulatory Visit: Payer: Self-pay | Admitting: Obstetrics and Gynecology

## 2019-09-15 ENCOUNTER — Inpatient Hospital Stay (HOSPITAL_BASED_OUTPATIENT_CLINIC_OR_DEPARTMENT_OTHER): Payer: Medicare Other | Admitting: Oncology

## 2019-09-15 ENCOUNTER — Other Ambulatory Visit: Payer: Self-pay

## 2019-09-15 ENCOUNTER — Other Ambulatory Visit: Payer: Self-pay | Admitting: *Deleted

## 2019-09-15 ENCOUNTER — Inpatient Hospital Stay: Payer: Medicare Other

## 2019-09-15 VITALS — BP 130/73 | HR 69 | Temp 98.4°F | Resp 18

## 2019-09-15 VITALS — BP 154/89 | HR 74 | Temp 97.2°F | Wt 179.3 lb

## 2019-09-15 DIAGNOSIS — D649 Anemia, unspecified: Secondary | ICD-10-CM | POA: Diagnosis not present

## 2019-09-15 DIAGNOSIS — Z95828 Presence of other vascular implants and grafts: Secondary | ICD-10-CM

## 2019-09-15 DIAGNOSIS — E538 Deficiency of other specified B group vitamins: Secondary | ICD-10-CM

## 2019-09-15 DIAGNOSIS — Z803 Family history of malignant neoplasm of breast: Secondary | ICD-10-CM | POA: Diagnosis not present

## 2019-09-15 DIAGNOSIS — Z5111 Encounter for antineoplastic chemotherapy: Secondary | ICD-10-CM | POA: Diagnosis not present

## 2019-09-15 DIAGNOSIS — E876 Hypokalemia: Secondary | ICD-10-CM | POA: Diagnosis not present

## 2019-09-15 DIAGNOSIS — E871 Hypo-osmolality and hyponatremia: Secondary | ICD-10-CM | POA: Diagnosis not present

## 2019-09-15 DIAGNOSIS — C561 Malignant neoplasm of right ovary: Secondary | ICD-10-CM

## 2019-09-15 DIAGNOSIS — Z7189 Other specified counseling: Secondary | ICD-10-CM

## 2019-09-15 DIAGNOSIS — F419 Anxiety disorder, unspecified: Secondary | ICD-10-CM | POA: Diagnosis not present

## 2019-09-15 DIAGNOSIS — D709 Neutropenia, unspecified: Secondary | ICD-10-CM

## 2019-09-15 DIAGNOSIS — Z90710 Acquired absence of both cervix and uterus: Secondary | ICD-10-CM | POA: Diagnosis not present

## 2019-09-15 DIAGNOSIS — Z90722 Acquired absence of ovaries, bilateral: Secondary | ICD-10-CM | POA: Diagnosis not present

## 2019-09-15 LAB — MAGNESIUM: Magnesium: 2 mg/dL (ref 1.7–2.4)

## 2019-09-15 LAB — CBC WITH DIFFERENTIAL/PLATELET
Abs Immature Granulocytes: 0.01 10*3/uL (ref 0.00–0.07)
Basophils Absolute: 0 10*3/uL (ref 0.0–0.1)
Basophils Relative: 1 %
Eosinophils Absolute: 0.2 10*3/uL (ref 0.0–0.5)
Eosinophils Relative: 5 %
HCT: 37.9 % (ref 36.0–46.0)
Hemoglobin: 12.2 g/dL (ref 12.0–15.0)
Immature Granulocytes: 0 %
Lymphocytes Relative: 48 %
Lymphs Abs: 1.6 10*3/uL (ref 0.7–4.0)
MCH: 27.5 pg (ref 26.0–34.0)
MCHC: 32.2 g/dL (ref 30.0–36.0)
MCV: 85.4 fL (ref 80.0–100.0)
Monocytes Absolute: 0.6 10*3/uL (ref 0.1–1.0)
Monocytes Relative: 16 %
Neutro Abs: 1 10*3/uL — ABNORMAL LOW (ref 1.7–7.7)
Neutrophils Relative %: 30 %
Platelets: 264 10*3/uL (ref 150–400)
RBC: 4.44 MIL/uL (ref 3.87–5.11)
RDW: 13.2 % (ref 11.5–15.5)
WBC: 3.4 10*3/uL — ABNORMAL LOW (ref 4.0–10.5)
nRBC: 0 % (ref 0.0–0.2)

## 2019-09-15 LAB — COMPREHENSIVE METABOLIC PANEL
ALT: 22 U/L (ref 0–44)
AST: 18 U/L (ref 15–41)
Albumin: 3.8 g/dL (ref 3.5–5.0)
Alkaline Phosphatase: 81 U/L (ref 38–126)
Anion gap: 11 (ref 5–15)
BUN: 10 mg/dL (ref 8–23)
CO2: 26 mmol/L (ref 22–32)
Calcium: 10.4 mg/dL — ABNORMAL HIGH (ref 8.9–10.3)
Chloride: 92 mmol/L — ABNORMAL LOW (ref 98–111)
Creatinine, Ser: 0.86 mg/dL (ref 0.44–1.00)
GFR calc Af Amer: >60 mL/min (ref >60)
GFR calc non Af Amer: >60 mL/min (ref >60)
Glucose, Bld: 112 mg/dL — ABNORMAL HIGH (ref 70–99)
Potassium: 3.2 mmol/L — ABNORMAL LOW (ref 3.5–5.1)
Sodium: 129 mmol/L — ABNORMAL LOW (ref 135–145)
Total Bilirubin: 0.5 mg/dL (ref 0.3–1.2)
Total Protein: 7.1 g/dL (ref 6.5–8.1)

## 2019-09-15 LAB — IRON AND TIBC
Iron: 33 ug/dL (ref 28–170)
Saturation Ratios: 8 % — ABNORMAL LOW (ref 10.4–31.8)
TIBC: 396 ug/dL (ref 250–450)
UIBC: 363 ug/dL

## 2019-09-15 LAB — FOLATE: Folate: 9.5 ng/mL (ref >5.9)

## 2019-09-15 LAB — FERRITIN: Ferritin: 20 ng/mL (ref 11–307)

## 2019-09-15 LAB — VITAMIN B12: Vitamin B-12: 176 pg/mL — ABNORMAL LOW (ref 180–914)

## 2019-09-15 MED ORDER — SODIUM CHLORIDE 0.9% FLUSH
10.0000 mL | Freq: Once | INTRAVENOUS | Status: AC
  Administered 2019-09-15: 09:00:00 10 mL via INTRAVENOUS
  Filled 2019-09-15: qty 10

## 2019-09-15 MED ORDER — SODIUM CHLORIDE 0.9 % IV SOLN
10.0000 mg | Freq: Once | INTRAVENOUS | Status: AC
  Administered 2019-09-15: 10 mg via INTRAVENOUS
  Filled 2019-09-15: qty 10

## 2019-09-15 MED ORDER — SODIUM CHLORIDE 0.9 % IV SOLN
175.0000 mg/m2 | Freq: Once | INTRAVENOUS | Status: AC
  Administered 2019-09-15: 336 mg via INTRAVENOUS
  Filled 2019-09-15: qty 56

## 2019-09-15 MED ORDER — FAMOTIDINE IN NACL 20-0.9 MG/50ML-% IV SOLN
20.0000 mg | Freq: Once | INTRAVENOUS | Status: AC
  Administered 2019-09-15: 12:00:00 20 mg via INTRAVENOUS
  Filled 2019-09-15: qty 50

## 2019-09-15 MED ORDER — PALONOSETRON HCL INJECTION 0.25 MG/5ML
0.2500 mg | Freq: Once | INTRAVENOUS | Status: AC
  Administered 2019-09-15: 0.25 mg via INTRAVENOUS
  Filled 2019-09-15: qty 5

## 2019-09-15 MED ORDER — SODIUM CHLORIDE 0.9 % IV SOLN
Freq: Once | INTRAVENOUS | Status: AC
  Filled 2019-09-15: qty 1000

## 2019-09-15 MED ORDER — PEGFILGRASTIM 6 MG/0.6ML ~~LOC~~ PSKT
6.0000 mg | PREFILLED_SYRINGE | Freq: Once | SUBCUTANEOUS | Status: AC
  Administered 2019-09-15: 6 mg via SUBCUTANEOUS
  Filled 2019-09-15: qty 0.6

## 2019-09-15 MED ORDER — SODIUM CHLORIDE 0.9 % IV SOLN
150.0000 mg | Freq: Once | INTRAVENOUS | Status: AC
  Administered 2019-09-15: 150 mg via INTRAVENOUS
  Filled 2019-09-15: qty 5

## 2019-09-15 MED ORDER — SODIUM CHLORIDE 0.9 % IV SOLN
354.8000 mg | Freq: Once | INTRAVENOUS | Status: AC
  Administered 2019-09-15: 350 mg via INTRAVENOUS
  Filled 2019-09-15: qty 35

## 2019-09-15 MED ORDER — DIPHENHYDRAMINE HCL 50 MG/ML IJ SOLN
50.0000 mg | Freq: Once | INTRAMUSCULAR | Status: AC
  Administered 2019-09-15: 50 mg via INTRAVENOUS
  Filled 2019-09-15: qty 1

## 2019-09-15 MED ORDER — KETOROLAC TROMETHAMINE 15 MG/ML IJ SOLN
15.0000 mg | Freq: Once | INTRAMUSCULAR | Status: AC
  Administered 2019-09-15: 15 mg via INTRAVENOUS
  Filled 2019-09-15: qty 1

## 2019-09-15 MED ORDER — SODIUM CHLORIDE 0.9 % IV SOLN
Freq: Once | INTRAVENOUS | Status: AC
  Filled 2019-09-15: qty 250

## 2019-09-15 MED ORDER — HEPARIN SOD (PORK) LOCK FLUSH 100 UNIT/ML IV SOLN
INTRAVENOUS | Status: AC
  Filled 2019-09-15: qty 5

## 2019-09-15 MED ORDER — HEPARIN SOD (PORK) LOCK FLUSH 100 UNIT/ML IV SOLN
500.0000 [IU] | Freq: Once | INTRAVENOUS | Status: AC | PRN
  Administered 2019-09-15: 500 [IU]
  Filled 2019-09-15: qty 5

## 2019-09-15 NOTE — Progress Notes (Signed)
Pt new chemo today. She states that where the port was threaded in at neck that it was hurting last night and she took a tramadol, 2 tylenol and a benadryl and it is not hurting now. It does have bruising at the insertion of line in her neck.

## 2019-09-15 NOTE — Telephone Encounter (Signed)
Advise

## 2019-09-15 NOTE — Progress Notes (Signed)
ANC 1, ok to proceed per md

## 2019-09-15 NOTE — Progress Notes (Signed)
@   0930 Labs reviewed with MD and treatment team. Per MD to proceed with treatment.   @1230  pt expressed to RN that "her right leg feels heavy, very uncomfortable, and it feels like her sciatic nerve pain is flaring up." RN gave Benadryl 50 mg IV at 1050 per order prior to chemotherapy infusion. Per pt her pain is a 6 our of 10. VSS. MD notified. MD at chairside at 1240 to assess pt. Verbal order to give pt Toradol 15 mg IV once at this time. Toradol 15 mg IV given to pt at 1252 per verbal order. Pain reassessed at 1311, per pt her pain is "much better." RN will continue to monitor pt closely.   [employer]

## 2019-09-16 ENCOUNTER — Telehealth: Payer: Self-pay

## 2019-09-16 ENCOUNTER — Encounter: Payer: Self-pay | Admitting: Genetic Counselor

## 2019-09-16 ENCOUNTER — Inpatient Hospital Stay: Payer: Medicare Other | Attending: Genetic Counselor | Admitting: Genetic Counselor

## 2019-09-16 DIAGNOSIS — Z803 Family history of malignant neoplasm of breast: Secondary | ICD-10-CM | POA: Insufficient documentation

## 2019-09-16 DIAGNOSIS — Z806 Family history of leukemia: Secondary | ICD-10-CM

## 2019-09-16 DIAGNOSIS — Z1379 Encounter for other screening for genetic and chromosomal anomalies: Secondary | ICD-10-CM

## 2019-09-16 DIAGNOSIS — C561 Malignant neoplasm of right ovary: Secondary | ICD-10-CM

## 2019-09-16 LAB — CA 125: Cancer Antigen (CA) 125: 18.1 U/mL (ref 0.0–38.1)

## 2019-09-16 NOTE — Telephone Encounter (Signed)
Telephone call to pt for follow up after receiving first infusion yesterday.  Pt sister answered the phone and states pt not at home.   Sister states pt did really well with her chemo.  States eating good and drinking plenty of fluids.  Encrouage sister to call for any questions or concerns.

## 2019-09-16 NOTE — Progress Notes (Signed)
REFERRING PROVIDER: Sindy Guadeloupe, MD [place],  [place] [address]  PRIMARY PROVIDER:  Juline Patch, MD  PRIMARY REASON FOR VISIT:  1. 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 Family history of breast cancer   3. Family history of leukemia      HISTORY OF PRESENT ILLNESS:  I connected with  Carrie Roberson on 09/16/2019 at 10 AM EDT by [place] video conference and verified that I am speaking with the correct person using two identifiers.   Patient location: Home Provider location: [facility]  Carrie Roberson, a 74 y.o. female, was seen for a [facility] [place] cancer genetics consultation at the request of Dr. Janese Banks due to a personal and family history of cancer.  Carrie Roberson presents to clinic today to discuss the possibility of a hereditary predisposition to cancer, genetic testing, and to further clarify her future cancer risks, as well as potential cancer risks for family members.   In January 2021, at the age of 44, Carrie Roberson was diagnosed with cancer of the right ovary. The treatment plan included bilateral salpingo-oophorectomy and chemotherapy.     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[place])   09/07/2019 Cancer Staging   Staging form: Ovary, Fallopian Tube, and Primary Peritoneal Carcinoma, AJCC 8th Edition - Pathologic stage from 09/07/2019: FIGO Stage IC2, calculated as Stage IC (pT1c2, pN0, cM0) - Signed by Sindy Guadeloupe, MD on 09/07/2019   09/15/2019 -  Chemotherapy   The patient had palonosetron (ALOXI) injection 0.25 mg, 0.25 mg, Intravenous,  Once, 1 of 6 cycles Administration: 0.25 mg (09/15/2019) pegfilgrastim (NEULASTA ONPRO KIT) injection 6 mg, 6 mg, Subcutaneous, Once, 1 of 6 cycles CARBOplatin (PARAPLATIN) 350 mg in sodium chloride 0.9 % 250 mL chemo infusion, 350 mg (100 % of original dose 354.8 mg), Intravenous,  Once, 1 of 6 cycles Dose modification:    (original dose 354.8 mg, Cycle 1) fosaprepitant (EMEND) 150 mg in sodium chloride 0.9 % 145 mL IVPB, 150 mg, Intravenous,  Once, 1 of 6 cycles Administration: 150 mg (09/15/2019) PACLitaxel (TAXOL) 336 mg in sodium chloride 0.9 % 500 mL chemo infusion (> 16m/m2), 175 mg/m2 = 336 mg, Intravenous,  Once, 1 of 6 cycles Administration: 336 mg (09/15/2019)  for chemotherapy treatment.       RISK FACTORS:  Menarche was at age 74  First live birth at age N/A.  OCP use for approximately 0 years.  Ovaries intact: no.  Hysterectomy: yes.  Menopausal status: postmenopausal.  HRT use: 7 years. Colonoscopy: yes; 6-9 polyps. Mammogram within the last year: yes. Number of breast biopsies: 0. Up to date with pelvic exams: no. Any excessive radiation exposure in the past: yes  Past Medical History:  Diagnosis Date  . Anxiety   . Arthritis   . Family history of breast cancer   . Family history of leukemia   . GERD (gastroesophageal reflux disease)   . History of ischemic colitis 09/02/2017  . Hx of colonic polyp   . Hypertension    H/O-PT TAKEN OFF BY PCP AS OF 2019  . IBS (irritable bowel syndrome)   . Ovarian cancer ([place]   . Ovarian cyst   . Vaginal atrophy     Past Surgical History:  Procedure Laterality Date  . BREAST BIOPSY Left    needle bx/clip-neg  . CERVICAL BIOPSY  W/ LOOP ELECTRODE EXCISION    .  CHOLECYSTECTOMY    . Colonoscopoy  2007, 2008, 2011  . COLONOSCOPY WITH PROPOFOL N/A 05/05/2018   Procedure: COLONOSCOPY WITH PROPOFOL;  Surgeon: Lin Landsman, MD;  Location: [facility];  Service: Gastroenterology;  Laterality: N/A;  . HYSTERECTOMY ABDOMINAL WITH SALPINGO-OOPHORECTOMY N/A 08/19/2019   Procedure: HYSTERECTOMY ABDOMINAL WITH SALPINGO-OOPHORECTOMY WITH PELVIC WASHINGS PERITONEAL BIOPSIES AND PARA AORTIC LYMPH NODE DISSECTION;  Surgeon: [name], MD;  Location: [facility];  Service: Gynecology;  Laterality: N/A;  . LEEP    . OMENTECTOMY N/A 08/19/2019    Procedure: OMENTECTOMY;  Surgeon: [name], MD;  Location: [facility];  Service: Gynecology;  Laterality: N/A;  . PORTA CATH INSERTION N/A 09/11/2019   Procedure: PORTA CATH INSERTION;  Surgeon: Algernon Huxley, MD;  Location: [facility];  Service: Cardiovascular;  Laterality: N/A;  . TUBAL LIGATION  1999    Social History   Socioeconomic History  . Marital status: Single    Spouse name: Not on file  . Number of children: 0  . Years of education: Not on file  . Highest education level: Associate degree: academic program  Occupational History  . Occupation: Retired  Tobacco Use  . Smoking status: Never Smoker  . Smokeless tobacco: Never Used  . Tobacco comment: smoking cessation materials not required  Substance and Sexual Activity  . Alcohol use: Yes    Comment: rarely  . Drug use: No  . Sexual activity: Never    Birth control/protection: Post-menopausal  Other Topics Concern  . Not on file  Social History Narrative  . Not on file   Social Determinants of Health   Financial Resource Strain:   . Difficulty of Paying Living Expenses: Not on file  Food Insecurity:   . Worried About Charity fundraiser in the Last Year: Not on file  . Ran Out of Food in the Last Year: Not on file  Transportation Needs:   . Lack of Transportation (Medical): Not on file  . Lack of Transportation (Non-Medical): Not on file  Physical Activity:   . Days of Exercise per Week: Not on file  . Minutes of Exercise per Session: Not on file  Stress:   . Feeling of Stress : Not on file  Social Connections:   . Frequency of Communication with Friends and Family: Not on file  . Frequency of Social Gatherings with Friends and Family: Not on file  . Attends Religious Services: Not on file  . Active Member of Clubs or Organizations: Not on file  . Attends Archivist Meetings: Not on file  . Marital Status: Not on file     FAMILY HISTORY:  We obtained a detailed,  4-generation family history.  Significant diagnoses are listed below: Family History  Problem Relation Age of Onset  . Heart failure Mother   . Atrial fibrillation Mother   . Hypertension Mother   . Diabetes Father   . Heart attack Father   . Breast cancer Maternal Aunt 70  . Breast cancer Maternal Grandmother 80  . Breast cancer Cousin        2 mat cousins  . Hypertension Sister   . Hypertension Sister   . Arthritis Sister   . Leukemia Paternal Aunt   . Leukemia Paternal Uncle   . Brain cancer Paternal Aunt   . Other Brother 13       Drowning accident  . Liver cancer Maternal Uncle   . Alcohol abuse Maternal Uncle   . Prostate cancer  Neg Hx   . Kidney cancer Neg Hx     The patient does not have children.  She has a brother and two sisters, none who had cancer.  Her parents are deceased from non-cancer related issues.    The patient's mother had a brother and sister.  Her brother was an alcoholic and died of liver cancer..  He had two daughters who had breast cancer - they both reportedly had genetic testing that was negative.  Her sister had breast cancer.  The patient's maternal grandparents are deceased.  The grandmother had breast cancer.  The patient's father had two brothers and two sisters.  One brother and one sister had leukemia and the sister had a son with leukemia.  His other sister had a brain cancer.  Ms. Giesler is aware of previous family history of genetic testing for hereditary cancer risks. Patient's maternal ancestors are of [place] and [place] descent, and paternal ancestors are of [place] descent. There is no reported Ashkenazi Jewish ancestry. There is no known consanguinity.  GENETIC COUNSELING ASSESSMENT: Ms. Blanchette is a 74 y.o. female with a personal and family history of cancer which is somewhat suggestive of a hereditary breast and ovarian cancer and predisposition to cancer given her diagnosis of ovarian cancer and a family history of breast cancer. We,  therefore, discussed and recommended the following at today's visit.   DISCUSSION: We discussed that 15 - 20% of ovarian cancer is hereditary, with most cases associated with BRCA mutations.  There are other genes that can be associated with hereditary ovarian cancer syndromes.  These include BRIP1, RAD51C, RAD51D and the Lynch syndrome genes.  We discussed that testing is beneficial for several reasons including knowing how to follow individuals after completing their treatment, identifying whether potential treatment options such as PARP inhibitors would be beneficial, and understand if other family members could be at risk for cancer and allow them to undergo genetic testing.   We reviewed the characteristics, features and inheritance patterns of hereditary cancer syndromes. We also discussed genetic testing, including the appropriate family members to test, the process of testing, insurance coverage and turn-around-time for results. We discussed the implications of a negative, positive, carrier and/or variant of uncertain significant result. We recommended Ms. Prezioso pursue genetic testing for the TumorNext-HRD+CancerNext gene panel. The CancerNext gene panel offered by [employer] includes sequencing and rearrangement analysis for the following 34 genes:   APC, ATM, BARD1, BMPR1A, BRCA1, BRCA2, BRIP1, CDH1, CDK4, CDKN2A, CHEK2, DICER1, HOXB13, EPCAM, GREM1, MLH1, MRE11A, MSH2, MSH6, MUTYH, NBN, NF1, PALB2, PMS2, POLD1, POLE, PTEN, RAD50, RAD51C, RAD51D, SMAD4, SMARCA4, STK11, and TP53.    Based on Ms. Ferreri's personal and family history of cancer, she meets medical criteria for genetic testing. Despite that she meets criteria, she may still have an out of pocket cost. We discussed that if her out of pocket cost for testing is over $100, the laboratory will call and confirm whether she wants to proceed with testing.  If the out of pocket cost of testing is less than $100 she will be billed by the genetic  testing laboratory.   PLAN: After considering the risks, benefits, and limitations, Ms. Tinner provided informed consent to pursue genetic testing and the blood sample was sent to [facility] for analysis of the TumorNext-HRD+CancerNext. Results should be available within approximately 2-3 weeks' time, at which point they will be disclosed by telephone to Ms. Whitelaw, as will any additional recommendations warranted by these results. Ms. Klasen  will receive a summary of her genetic counseling visit and a copy of her results once available. This information will also be available in Epic.   Lastly, we encouraged Ms. Honold to remain in contact with cancer genetics annually so that we can continuously update the family history and inform her of any changes in cancer genetics and testing that may be of benefit for this family.   Ms. Dimitrov questions were answered to her satisfaction today. Our contact information was provided should additional questions or concerns arise. Thank you for the referral and allowing [place] to share in the care of your patient.   Macyn Shropshire P. Florene Glen, [place], [facility] Licensed, Insurance risk surveyor [email or web address] phone: [phone]  The patient was seen for a total of 49 minutes in face-to-face genetic counseling.  This patient was discussed with Drs. Magrinat, Lindi Adie and/or Burr Medico who agrees with the above.    _______________________________________________________________________ For Office Staff:  Number of people involved in session: 1 Was an Intern/ student involved with case: no

## 2019-09-18 ENCOUNTER — Telehealth: Payer: Self-pay | Admitting: *Deleted

## 2019-09-18 ENCOUNTER — Other Ambulatory Visit: Payer: Self-pay | Admitting: *Deleted

## 2019-09-18 DIAGNOSIS — E538 Deficiency of other specified B group vitamins: Secondary | ICD-10-CM | POA: Insufficient documentation

## 2019-09-18 MED ORDER — OXYCODONE-ACETAMINOPHEN 5-325 MG PO TABS: 1.0000 | ORAL_TABLET | ORAL | 0 refills | Status: DC | PRN

## 2019-09-18 MED ORDER — POTASSIUM CHLORIDE CRYS ER 20 MEQ PO TBCR: 20.0000 meq | EXTENDED_RELEASE_TABLET | Freq: Every day | ORAL | 0 refills | Status: DC

## 2019-09-18 NOTE — Telephone Encounter (Signed)
Advised patient to take her Oxycodone, she is trying to find her old bottle and will let us know if she needs a refill of this.

## 2019-09-18 NOTE — Telephone Encounter (Signed)
She can try oxycodone that she has

## 2019-09-18 NOTE — Progress Notes (Signed)
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  This was followed by an MR pelvis with and without contrast in December 2020 which showed a large cystic mass arising from the right ovary measuring 1.8 cm.  This was followed by TAH/BSO with peritoneal biopsies with pelvic/aortic lymph node dissection pelvic washings and omentectomy with Dr. Fransisca Connors and Dr. Georgianne Fick on 08/19/2019.  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  Angiolymphatic invasion is present.  Bilateral fallopian tubes were negative for atypia and malignancy.  Peritoneal stone excision negative for malignancy.  Uterus with cervix negative for atypia and malignancy.  Bladder, right gutter negative for malignancy.  5 lymph nodes were sampled and were negative for malignancy.  Pelvic washings were also negative for malignancy.  Pathologic staging FIGO 1 C2  Patient was seen by Dr. Fransisca Connors postoperatively and recommendation was to proceed with adjuvant chemotherapy carbotaxol for 6 cycles.   Interval history-patient feels anxious about starting chemotherapy today but otherwise feels well.  She reports some soreness at the site of her port.  She has chronic right  hip pain.  ECOG PS- 1 Pain scale- 2 Opioid associated constipation- no  Review of systems- Review of Systems  Constitutional: Negative for chills, fever, malaise/fatigue and weight loss.  HENT: Negative for congestion, ear discharge and nosebleeds.   Eyes: Negative for blurred vision.  Respiratory: Negative for cough, hemoptysis, sputum production, shortness of breath and wheezing.   Cardiovascular: Negative for chest pain, palpitations, orthopnea and claudication.  Gastrointestinal: Negative for abdominal pain, blood in stool, constipation, diarrhea, heartburn, melena, nausea and vomiting.  Genitourinary: Negative for dysuria, flank pain, frequency, hematuria and urgency.  Musculoskeletal: Positive for joint pain. Negative for back pain and myalgias.  Skin: Negative for rash.  Neurological: Negative for dizziness, tingling, focal weakness, seizures, weakness and headaches.  Endo/Heme/Allergies: Does not bruise/bleed easily.  Psychiatric/Behavioral: Negative for depression and suicidal ideas. The patient is nervous/anxious. The patient does not have insomnia.        Allergies  Allergen Reactions  . Citalopram Nausea And Vomiting  . Ivp Dye [Iodinated Diagnostic Agents] Hives and Swelling    hives  . Meloxicam     unknown  . Adhesive [Tape] Rash  . Amlodipine Rash  . Ampicillin Rash    Did it involve swelling of the face/tongue/throat, SOB, or low BP? No Did it involve sudden or severe rash/hives, skin peeling, or any reaction on the inside of your mouth or nose? Yes Did you need to seek medical attention at a hospital or doctor's office? Yes When did it last happen? 10 + years If all above answers are "NO", may proceed with cephalosporin use.  . Cefoxitin Rash  . Estradiol Rash  . Estropipate Rash  . Olmesartan Medoxomil-Hctz Rash  . Sertraline Rash       .  Sulfa Antibiotics Rash     Past Medical History:  Diagnosis Date  . Anxiety   . Arthritis   . Family  history of breast cancer   . Family history of leukemia   . GERD (gastroesophageal reflux disease)   . History of ischemic colitis 09/02/2017  . Hx of colonic polyp   . Hypertension    H/O-PT TAKEN OFF BY PCP AS OF 2019  . IBS (irritable bowel syndrome)   . Ovarian cancer ([place])   . Ovarian cyst   . Vaginal atrophy      Past Surgical History:  Procedure Laterality Date  . BREAST BIOPSY Left    needle bx/clip-neg  . CERVICAL BIOPSY  W/ LOOP ELECTRODE EXCISION    . CHOLECYSTECTOMY    . Colonoscopoy  2007, 2008, 2011  . COLONOSCOPY WITH PROPOFOL N/A 05/05/2018   Procedure: COLONOSCOPY WITH PROPOFOL;  Surgeon: Lin Landsman, MD;  Location: [facility] ENDOSCOPY;  Service: Gastroenterology;  Laterality: N/A;  . HYSTERECTOMY ABDOMINAL WITH SALPINGO-OOPHORECTOMY N/A 08/19/2019   Procedure: HYSTERECTOMY ABDOMINAL WITH SALPINGO-OOPHORECTOMY WITH PELVIC WASHINGS PERITONEAL BIOPSIES AND PARA AORTIC LYMPH NODE DISSECTION;  Surgeon: [name], MD;  Location: [facility] ORS;  Service: Gynecology;  Laterality: N/A;  . LEEP    . OMENTECTOMY N/A 08/19/2019   Procedure: OMENTECTOMY;  Surgeon: [name], MD;  Location: [facility] ORS;  Service: Gynecology;  Laterality: N/A;  . PORTA CATH INSERTION N/A 09/11/2019   Procedure: PORTA CATH INSERTION;  Surgeon: Algernon Huxley, MD;  Location: [facility];  Service: Cardiovascular;  Laterality: N/A;  . TUBAL LIGATION  1999    Social History   Socioeconomic History  . Marital status: Single    Spouse name: Not on file  . Number of children: 0  . Years of education: Not on file  . Highest education level: Associate degree: academic program  Occupational History  . Occupation: Retired  Tobacco Use  . Smoking status: Never Smoker  . Smokeless tobacco: Never Used  . Tobacco comment: smoking cessation materials not required  Substance and Sexual Activity  . Alcohol use: Yes    Comment: rarely  . Drug use: No  . Sexual activity: Never     Birth control/protection: Post-menopausal  Other Topics Concern  . Not on file  Social History Narrative  . Not on file   Social Determinants of Health   Financial Resource Strain:   . Difficulty of Paying Living Expenses: Not on file  Food Insecurity:   . Worried About Charity fundraiser in the Last Year: Not on file  . Ran Out of Food in the Last Year: Not on file  Transportation Needs:   . Lack of Transportation (Medical): Not on file  . Lack of Transportation (Non-Medical): Not on file  Physical Activity:   . Days of Exercise per Week: Not on file  . Minutes of Exercise per Session: Not on file  Stress:   . Feeling of Stress : Not on file  Social Connections:   . Frequency of Communication with Friends and Family: Not on file  . Frequency of Social Gatherings with Friends and Family: Not on file  . Attends Religious Services: Not on file  . Active Member of Clubs or Organizations: Not on file  . Attends Archivist Meetings: Not on file  . Marital Status: Not on file  Intimate Partner Violence:   . Fear of Current or Ex-Partner: Not on file  . Emotionally Abused: Not on file  .  Physically Abused: Not on file  . Sexually Abused: Not on file    Family History  Problem Relation Age of Onset  . Heart failure Mother   . Atrial fibrillation Mother   . Hypertension Mother   . Diabetes Father   . Heart attack Father   . Breast cancer Maternal Aunt 70  . Breast cancer Maternal Grandmother 80  . Breast cancer Cousin        2 mat cousins  . Hypertension Sister   . Hypertension Sister   . Arthritis Sister   . Leukemia Paternal Aunt   . Leukemia Paternal Uncle   . Brain cancer Paternal Aunt   . Other Brother 13       Drowning accident  . Liver cancer Maternal Uncle   . Alcohol abuse Maternal Uncle   . Prostate cancer Neg Hx   . Kidney cancer Neg Hx      Current Outpatient Medications:  .  acetaminophen (TYLENOL) 500 MG tablet, Take 1,000 mg by mouth  every 6 (six) hours as needed (for pain.)., Disp: , Rfl:  .  cetirizine (ZYRTEC) 10 MG tablet, Take 10 mg by mouth daily as needed (allergies.). , Disp: , Rfl:  .  dexamethasone (DECADRON) 4 MG tablet, Take 2 tablets (8 mg total) by mouth daily. Start the day after carboplatin chemotherapy for 3 days., Disp: 30 tablet, Rfl: 1 .  diphenhydrAMINE (BENADRYL) 25 MG tablet, Take 25 mg by mouth daily as needed for itching or allergies., Disp: , Rfl:  .  docusate sodium (COLACE) 100 MG capsule, Take 1 capsule (100 mg total) by mouth 2 (two) times daily., Disp: 10 capsule, Rfl: 0 .  enoxaparin (LOVENOX) 40 MG/0.4ML injection, Inject 0.4 mLs (40 mg total) into the skin daily. (Patient taking differently: Inject 40 mg into the skin daily at 12 noon. ), Disp: 8.8 mL, Rfl: 0 .  fluticasone (FLONASE) 50 MCG/ACT nasal spray, Place 2 sprays into both nostrils daily. (Patient taking differently: Place 2 sprays into both nostrils daily as needed (allergies.). ), Disp: 16 g, Rfl: 0 .  hydrocortisone cream 1 %, Apply 1 application topically daily as needed for itching., Disp: , Rfl:  .  ibuprofen (ADVIL) 600 MG tablet, Take 1 tablet (600 mg total) by mouth every 6 (six) hours as needed for fever or headache., Disp: 30 tablet, Rfl: 0 .  lidocaine-prilocaine (EMLA) cream, Apply to affected area once (Patient taking differently: Apply 1 application topically daily as needed (prior to port being accessed.). ), Disp: 30 g, Rfl: 3 .  LORazepam (ATIVAN) 0.5 MG tablet, Take 1 tablet (0.5 mg total) by mouth every 6 (six) hours as needed (Nausea or vomiting). (Patient taking differently: Take 0.5 mg by mouth every 6 (six) hours as needed (Nausea or vomiting). Nausea/vomiting), Disp: 30 tablet, Rfl: 0 .  omeprazole (PRILOSEC) 20 MG capsule, Take 20 mg by mouth daily as needed (acid reflux)., Disp: , Rfl:  .  ondansetron (ZOFRAN) 8 MG tablet, Take 1 tablet (8 mg total) by mouth 2 (two) times daily as needed for refractory nausea /  vomiting. Start on day 3 after carboplatin chemo., Disp: 30 tablet, Rfl: 1 .  prochlorperazine (COMPAZINE) 10 MG tablet, Take 1 tablet (10 mg total) by mouth every 6 (six) hours as needed (Nausea or vomiting)., Disp: 30 tablet, Rfl: 1 .  senna (SENOKOT) 8.6 MG tablet, Take 1 tablet by mouth daily., Disp: , Rfl:  .  traMADol (ULTRAM) 50 MG tablet, Take 1 tablet (  50 mg total) by mouth every 6 (six) hours as needed. (Patient taking differently: Take 50 mg by mouth every 6 (six) hours as needed (for pain.). ), Disp: 60 tablet, Rfl: 0 .  amitriptyline (ELAVIL) 25 MG tablet, TAKE 1 TABLET BY MOUTH EVERYDAY AT BEDTIME (Patient not taking: No sig reported), Disp: 90 tablet, Rfl: 0 .  benzocaine-resorcinol (VAGISIL) 5-2 % vaginal cream, Place 1 application vaginally daily as needed for itching or irritation., Disp: , Rfl:  .  guaiFENesin (MUCINEX) 600 MG 12 hr tablet, Take 600 mg by mouth at bedtime as needed (congestion)., Disp: , Rfl:  .  hydrochlorothiazide (HYDRODIURIL) 25 MG tablet, TAKE 1 TABLET BY MOUTH EVERY DAY, Disp: 30 tablet, Rfl: 0 .  oxyCODONE-acetaminophen (PERCOCET/ROXICET) 5-325 MG tablet, Take 1-2 tablets by mouth every 4 (four) hours as needed for moderate pain. (Patient not taking: Reported on 09/14/2019), Disp: 40 tablet, Rfl: 0 .  psyllium (METAMUCIL) 0.52 g capsule, Take 0.52 g by mouth in the morning and at bedtime., Disp: , Rfl:   Physical exam:  Vitals:   09/15/19 0852  BP: (!) 154/89  Pulse: 74  Temp: (!) 97.2 F (36.2 C)  TempSrc: Tympanic  Weight: 179 lb 4.8 oz (81.3 kg)   Physical Exam HENT:     Head: Normocephalic and atraumatic.  Eyes:     Pupils: Pupils are equal, round, and reactive to light.  Cardiovascular:     Rate and Rhythm: Normal rate and regular rhythm.     Heart sounds: Normal heart sounds.  Pulmonary:     Effort: Pulmonary effort is normal.     Breath sounds: Normal breath sounds.  Abdominal:     General: Bowel sounds are normal.     Palpations:  Abdomen is soft.  Musculoskeletal:     Cervical back: Normal range of motion.  Skin:    General: Skin is warm and dry.  Neurological:     Mental Status: She is alert and oriented to person, place, and time.      CMP Latest Ref Rng & Units 09/15/2019  Glucose 70 - 99 mg/dL 112(H)  BUN 8 - 23 mg/dL 10  Creatinine 0.44 - 1.00 mg/dL 0.86  Sodium 135 - 145 mmol/L 129(L)  Potassium 3.5 - 5.1 mmol/L 3.2(L)  Chloride 98 - 111 mmol/L 92(L)  CO2 22 - 32 mmol/L 26  Calcium 8.9 - 10.3 mg/dL 10.4(H)  Total Protein 6.5 - 8.1 g/dL 7.1  Total Bilirubin 0.3 - 1.2 mg/dL 0.5  Alkaline Phos 38 - 126 U/L 81  AST 15 - 41 U/L 18  ALT 0 - 44 U/L 22   CBC Latest Ref Rng & Units 09/15/2019  WBC 4.0 - 10.5 K/uL 3.4(L)  Hemoglobin 12.0 - 15.0 g/dL 12.2  Hematocrit 36.0 - 46.0 % 37.9  Platelets 150 - 400 K/uL 264    No images are attached to the encounter.  PERIPHERAL VASCULAR CATHETERIZATION  Result Date: 09/11/2019 See op note    Assessment and plan- Patient is a 74 y.o. 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  She is here for on treatment assessment prior to cycle 1 of adjuvant carbotaxol chemotherapy  Today her white cell count is 3.4 with an Carlyss of 1.  Looking back at her prior counts she has had a white cell count between 3.5-3.7 in the past although differential was not done.  The cause of baseline neutropenia is unclear.  I will proceed  with chemotherapy today but lower her carboplatin dose to AUC 4 given her neutropenia.  She will also be receiving on pro-Neulasta today.  Advised her to take as needed Tylenol or Claritin.  She has Neulasta associated bone pain.  Call us if she has any signs and symptoms of infection.  I will see her back in 1 week to see how she tolerated her chemotherapy and check her CBC and BMP on that day.  Again discussed risks and benefits of chemotherapy including all but not limited to nausea, vomiting, low blood counts,  risk of infections and hospitalizations.  Risk of infusion reaction and peripheral neuropathy associated with Taxol.  Patient understands and agrees to proceed.  Treatment is being given with a curative intent  Hypokalemia: She will start taking 20 mEq of oral potassium daily for the next 2 weeks.  Hyponatremia: Encouraged patient to take plenty of oral fluids over the next few days.  Patient is due for staging CT chest abdomen and pelvis which she will get next week prior to her appointment with me.  B12 deficiency: We will add B12 shots with each visit   Visit Diagnosis 1. Ovarian cancer on right ([place])   2. Neutropenia, unspecified type ([place])   3. Encounter for antineoplastic chemotherapy   4. Hyponatremia   5. Hypokalemia   6. Goals of care, counseling/discussion      Dr. Randa Evens, MD, MPH [facility] [id number] 09/18/2019 7:42 AM

## 2019-09-18 NOTE — Telephone Encounter (Signed)
Patient called reporting that sinc ehjer chemotherapy 2 days ago, she has had RLS and has tried Tylenol and Tramadol for it and is asking what can be taken as she only got 2 hours of sleep last night. Pleas advise

## 2019-09-18 NOTE — Telephone Encounter (Signed)
Patient called reporting that she is out of Oxycodone and needs a refill sent in

## 2019-09-21 ENCOUNTER — Other Ambulatory Visit: Payer: Self-pay | Admitting: Oncology

## 2019-09-21 ENCOUNTER — Telehealth: Payer: Self-pay | Admitting: *Deleted

## 2019-09-21 ENCOUNTER — Ambulatory Visit: Admission: RE | Admit: 2019-09-21 | Payer: Medicare Other | Source: Ambulatory Visit

## 2019-09-21 NOTE — Telephone Encounter (Signed)
Can you please call her and clarify her issues?

## 2019-09-21 NOTE — Telephone Encounter (Signed)
Patient called reporting that she has had a horrible weekend with side effects of her medications and she had to cancel her CT this morning due to nausea. She requests a return call to discuss her medications [phone]

## 2019-09-21 NOTE — Telephone Encounter (Signed)
Spoke to patient. She experienced significant restless leg over the weekend and took oxycodone for symptoms which resulted in constipation and nausea. Advised her to hold off on oxycodone and discussed bowel protocol with senna and miralax. Discussed RLS w/ Dr. Janese Banks who advised ferritin had been low and recommended Feraheme x 2 doses. Discussed with patient who is in agreement. I advised her that in the interim she can try lorazepam which she has on hand for refractory nausea which may help with mild RLS. Asked her to notify clinic if symptoms of constipation don't resolve with bowel medications or if nausea persists.

## 2019-09-22 ENCOUNTER — Ambulatory Visit: Payer: Medicare Other | Admitting: Oncology

## 2019-09-22 ENCOUNTER — Ambulatory Visit: Payer: Medicare Other

## 2019-09-22 ENCOUNTER — Other Ambulatory Visit: Payer: Medicare Other

## 2019-09-23 ENCOUNTER — Encounter: Payer: Self-pay | Admitting: Oncology

## 2019-09-23 ENCOUNTER — Other Ambulatory Visit: Payer: Self-pay

## 2019-09-23 NOTE — Progress Notes (Unsigned)
Patient stated that she had not been able to sleep at nighttime due to her restless leg syndrome. Patient stated that she would like something to help her with her legs and to help her sleep. Patient also wanted to talk about her "iron being low".

## 2019-09-24 ENCOUNTER — Ambulatory Visit: Payer: Medicare Other | Attending: Internal Medicine

## 2019-09-24 ENCOUNTER — Ambulatory Visit: Payer: Medicare Other

## 2019-09-24 ENCOUNTER — Inpatient Hospital Stay: Payer: Medicare Other

## 2019-09-24 ENCOUNTER — Other Ambulatory Visit: Payer: Medicare Other

## 2019-09-24 ENCOUNTER — Ambulatory Visit: Payer: Medicare Other | Admitting: Oncology

## 2019-09-24 ENCOUNTER — Telehealth: Payer: Self-pay | Admitting: Obstetrics and Gynecology

## 2019-09-24 ENCOUNTER — Telehealth: Payer: Self-pay | Admitting: *Deleted

## 2019-09-24 ENCOUNTER — Inpatient Hospital Stay: Payer: Medicare Other | Admitting: Oncology

## 2019-09-24 DIAGNOSIS — Z20822 Contact with and (suspected) exposure to covid-19: Secondary | ICD-10-CM

## 2019-09-24 NOTE — Telephone Encounter (Signed)
I spoke to pt, she is being tested today. Her cancer doc advised she not come to anymore appointments until all involved have been negative. Pt aware the 3/8 appointment will be a phone visit.

## 2019-09-24 NOTE — Telephone Encounter (Signed)
Patient called reporting that several of her family members have been exposed to [place] and she has been around them all week. Patient states she has had both of her COVID vaccines. Per Dr Janese Banks, cancel appts this morning and have her get tested.  Patient informed and multiple questions answered about being around family members who were also exposed. Patient given number to schedule appointment for testing and advised that all family members be tested

## 2019-09-24 NOTE — Telephone Encounter (Signed)
Patient is calling to speak with Dr. Danielle Rankin nurse about her up coming appointment. Patient believes she may have been exposed to poss covid 19. Please call patient. Thank you

## 2019-09-25 ENCOUNTER — Other Ambulatory Visit: Payer: Self-pay | Admitting: *Deleted

## 2019-09-25 LAB — NOVEL CORONAVIRUS, NAA: SARS-CoV-2, NAA: NOT DETECTED

## 2019-09-25 NOTE — Addendum Note (Signed)
Addended by: Faythe Casa E on: 09/25/2019 01:16 PM   Modules accepted: Level of Service

## 2019-09-28 ENCOUNTER — Other Ambulatory Visit: Payer: Self-pay

## 2019-09-28 ENCOUNTER — Ambulatory Visit (INDEPENDENT_AMBULATORY_CARE_PROVIDER_SITE_OTHER): Payer: Medicare Other | Admitting: Obstetrics and Gynecology

## 2019-09-28 DIAGNOSIS — Z4889 Encounter for other specified surgical aftercare: Secondary | ICD-10-CM

## 2019-09-28 NOTE — Progress Notes (Signed)
    I connected with Carrie Roberson on 09/28/19 at 10:10 AM EST by telephone and verified that I am speaking with the correct person using two identifiers.   I discussed the limitations, risks, security and privacy concerns of performing an evaluation and management service by telephone and the availability of in person appointments. I also discussed with the patient that there may be a patient responsible charge related to this service. The patient expressed understanding and agreed to proceed.  The patient was at home I spoke with the patient from my workstation phone The names of people involved in this encounter were: Carrie Roberson , and Carrie Roberson   Postoperative Follow-up Patient presents post op from  TAH, BSO, omentectomy, LND  6weeks ago for ovarian cancer.  Subjective: Patient reports marked improvement in her preop symptoms. Eating a regular diet without difficulty. The patient is not having any pain.  Activity: normal activities of daily living.  Objective: There were no vitals taken for this visit.  No physical exam as this was a remote telephone visit to promote social distancing during the current COVID-19 Pandemic   Lab on 09/24/2019  Component Date Value Ref Range Status  . SARS-CoV-2, NAA 09/24/2019 Not Detected  Not Detected Final   Comment: This nucleic acid amplification test was developed and its performance characteristics determined by [employer]. Nucleic acid amplification tests include RT-PCR and TMA. This test has not been FDA cleared or approved. This test has been authorized by FDA under an Emergency Use Authorization (EUA). This test is only authorized for the duration of time the declaration that circumstances exist justifying the authorization of the emergency use of in vitro diagnostic tests for detection of SARS-CoV-2 virus and/or diagnosis of COVID-19 infection under section 564(b)(1) of the Act, 21 U.S.C. [id number]) (1), unless the  authorization is terminated or revoked sooner. When diagnostic testing is negative, the possibility of a false negative result should be considered in the context of a patient's recent exposures and the presence of clinical signs and symptoms consistent with COVID-19. An individual without symptoms of COVID-19 and who is not shedding SARS-CoV-2 virus wo                          uld expect to have a negative (not detected) result in this assay.     Assessment: 74 y.o. s/p TAH, BSO, omentectomy, LND stable   Plan: Patient has done well after surgery with no apparent complications.  I have discussed the post-operative Roberson to date, and the expected progress moving forward.  The patient understands what complications to be concerned about.  I will see the patient in routine follow up, or sooner if needed.    Activity plan: No restriction.   Carrie Mood, MD, [facility], [facility] 09/28/2019, 10:18 AM

## 2019-09-29 ENCOUNTER — Ambulatory Visit
Admission: RE | Admit: 2019-09-29 | Discharge: 2019-09-29 | Disposition: A | Discharge status: Home / Self Care | Payer: Medicare Other | Source: Ambulatory Visit | Attending: Oncology | Admitting: Oncology

## 2019-09-29 DIAGNOSIS — I700 Atherosclerosis of aorta: Secondary | ICD-10-CM | POA: Diagnosis not present

## 2019-09-29 DIAGNOSIS — R1030 Lower abdominal pain, unspecified: Secondary | ICD-10-CM | POA: Diagnosis not present

## 2019-09-29 DIAGNOSIS — C561 Malignant neoplasm of right ovary: Secondary | ICD-10-CM | POA: Diagnosis not present

## 2019-09-29 DIAGNOSIS — C569 Malignant neoplasm of unspecified ovary: Secondary | ICD-10-CM | POA: Diagnosis not present

## 2019-10-02 ENCOUNTER — Inpatient Hospital Stay (HOSPITAL_BASED_OUTPATIENT_CLINIC_OR_DEPARTMENT_OTHER): Payer: Medicare Other | Admitting: Nurse Practitioner

## 2019-10-02 ENCOUNTER — Other Ambulatory Visit: Payer: Self-pay

## 2019-10-02 ENCOUNTER — Encounter: Payer: Self-pay | Admitting: Nurse Practitioner

## 2019-10-02 ENCOUNTER — Inpatient Hospital Stay: Payer: Medicare Other | Attending: Oncology

## 2019-10-02 ENCOUNTER — Inpatient Hospital Stay: Payer: Medicare Other

## 2019-10-02 VITALS — BP 156/84 | HR 80 | Temp 97.9°F | Resp 18 | Wt 178.2 lb

## 2019-10-02 DIAGNOSIS — Z5189 Encounter for other specified aftercare: Secondary | ICD-10-CM | POA: Insufficient documentation

## 2019-10-02 DIAGNOSIS — D701 Agranulocytosis secondary to cancer chemotherapy: Secondary | ICD-10-CM | POA: Diagnosis not present

## 2019-10-02 DIAGNOSIS — E611 Iron deficiency: Secondary | ICD-10-CM | POA: Insufficient documentation

## 2019-10-02 DIAGNOSIS — E871 Hypo-osmolality and hyponatremia: Secondary | ICD-10-CM | POA: Insufficient documentation

## 2019-10-02 DIAGNOSIS — Z90710 Acquired absence of both cervix and uterus: Secondary | ICD-10-CM | POA: Diagnosis not present

## 2019-10-02 DIAGNOSIS — F418 Other specified anxiety disorders: Secondary | ICD-10-CM | POA: Diagnosis not present

## 2019-10-02 DIAGNOSIS — Z5111 Encounter for antineoplastic chemotherapy: Secondary | ICD-10-CM | POA: Insufficient documentation

## 2019-10-02 DIAGNOSIS — Z79899 Other long term (current) drug therapy: Secondary | ICD-10-CM | POA: Insufficient documentation

## 2019-10-02 DIAGNOSIS — E860 Dehydration: Secondary | ICD-10-CM | POA: Diagnosis not present

## 2019-10-02 DIAGNOSIS — I10 Essential (primary) hypertension: Secondary | ICD-10-CM | POA: Diagnosis not present

## 2019-10-02 DIAGNOSIS — Z7952 Long term (current) use of systemic steroids: Secondary | ICD-10-CM | POA: Insufficient documentation

## 2019-10-02 DIAGNOSIS — Z808 Family history of malignant neoplasm of other organs or systems: Secondary | ICD-10-CM | POA: Insufficient documentation

## 2019-10-02 DIAGNOSIS — Z90722 Acquired absence of ovaries, bilateral: Secondary | ICD-10-CM | POA: Insufficient documentation

## 2019-10-02 DIAGNOSIS — Z8249 Family history of ischemic heart disease and other diseases of the circulatory system: Secondary | ICD-10-CM | POA: Insufficient documentation

## 2019-10-02 DIAGNOSIS — Z9079 Acquired absence of other genital organ(s): Secondary | ICD-10-CM | POA: Insufficient documentation

## 2019-10-02 DIAGNOSIS — C561 Malignant neoplasm of right ovary: Secondary | ICD-10-CM | POA: Diagnosis not present

## 2019-10-02 DIAGNOSIS — C801 Malignant (primary) neoplasm, unspecified: Secondary | ICD-10-CM | POA: Insufficient documentation

## 2019-10-02 DIAGNOSIS — F419 Anxiety disorder, unspecified: Secondary | ICD-10-CM | POA: Insufficient documentation

## 2019-10-02 DIAGNOSIS — R110 Nausea: Secondary | ICD-10-CM | POA: Diagnosis not present

## 2019-10-02 DIAGNOSIS — Z7901 Long term (current) use of anticoagulants: Secondary | ICD-10-CM | POA: Insufficient documentation

## 2019-10-02 DIAGNOSIS — E538 Deficiency of other specified B group vitamins: Secondary | ICD-10-CM | POA: Insufficient documentation

## 2019-10-02 DIAGNOSIS — Z8261 Family history of arthritis: Secondary | ICD-10-CM | POA: Insufficient documentation

## 2019-10-02 DIAGNOSIS — Z833 Family history of diabetes mellitus: Secondary | ICD-10-CM | POA: Insufficient documentation

## 2019-10-02 DIAGNOSIS — D509 Iron deficiency anemia, unspecified: Secondary | ICD-10-CM | POA: Diagnosis not present

## 2019-10-02 DIAGNOSIS — T451X5A Adverse effect of antineoplastic and immunosuppressive drugs, initial encounter: Secondary | ICD-10-CM | POA: Insufficient documentation

## 2019-10-02 DIAGNOSIS — Z791 Long term (current) use of non-steroidal anti-inflammatories (NSAID): Secondary | ICD-10-CM | POA: Diagnosis not present

## 2019-10-02 DIAGNOSIS — Z95828 Presence of other vascular implants and grafts: Secondary | ICD-10-CM

## 2019-10-02 DIAGNOSIS — Z806 Family history of leukemia: Secondary | ICD-10-CM | POA: Insufficient documentation

## 2019-10-02 DIAGNOSIS — G2581 Restless legs syndrome: Secondary | ICD-10-CM | POA: Diagnosis not present

## 2019-10-02 DIAGNOSIS — E876 Hypokalemia: Secondary | ICD-10-CM | POA: Insufficient documentation

## 2019-10-02 DIAGNOSIS — E041 Nontoxic single thyroid nodule: Secondary | ICD-10-CM

## 2019-10-02 DIAGNOSIS — K589 Irritable bowel syndrome without diarrhea: Secondary | ICD-10-CM | POA: Insufficient documentation

## 2019-10-02 DIAGNOSIS — F329 Major depressive disorder, single episode, unspecified: Secondary | ICD-10-CM | POA: Insufficient documentation

## 2019-10-02 DIAGNOSIS — Z803 Family history of malignant neoplasm of breast: Secondary | ICD-10-CM | POA: Insufficient documentation

## 2019-10-02 LAB — COMPREHENSIVE METABOLIC PANEL
ALT: 22 U/L (ref 0–44)
AST: 18 U/L (ref 15–41)
Albumin: 3.8 g/dL (ref 3.5–5.0)
Alkaline Phosphatase: 84 U/L (ref 38–126)
Anion gap: 9 (ref 5–15)
BUN: 12 mg/dL (ref 8–23)
CO2: 24 mmol/L (ref 22–32)
Calcium: 9.9 mg/dL (ref 8.9–10.3)
Chloride: 99 mmol/L (ref 98–111)
Creatinine, Ser: 0.62 mg/dL (ref 0.44–1.00)
GFR calc Af Amer: >60 mL/min (ref >60)
GFR calc non Af Amer: >60 mL/min (ref >60)
Glucose, Bld: 106 mg/dL — ABNORMAL HIGH (ref 70–99)
Potassium: 3.9 mmol/L (ref 3.5–5.1)
Sodium: 132 mmol/L — ABNORMAL LOW (ref 135–145)
Total Bilirubin: 0.5 mg/dL (ref 0.3–1.2)
Total Protein: 6.7 g/dL (ref 6.5–8.1)

## 2019-10-02 LAB — CBC WITH DIFFERENTIAL/PLATELET
Abs Immature Granulocytes: 0.02 10*3/uL (ref 0.00–0.07)
Basophils Absolute: 0 10*3/uL (ref 0.0–0.1)
Basophils Relative: 1 %
Eosinophils Absolute: 0 10*3/uL (ref 0.0–0.5)
Eosinophils Relative: 1 %
HCT: 36.2 % (ref 36.0–46.0)
Hemoglobin: 12 g/dL (ref 12.0–15.0)
Immature Granulocytes: 1 %
Lymphocytes Relative: 47 %
Lymphs Abs: 1.7 10*3/uL (ref 0.7–4.0)
MCH: 28.3 pg (ref 26.0–34.0)
MCHC: 33.1 g/dL (ref 30.0–36.0)
MCV: 85.4 fL (ref 80.0–100.0)
Monocytes Absolute: 0.6 10*3/uL (ref 0.1–1.0)
Monocytes Relative: 19 %
Neutro Abs: 1.1 10*3/uL — ABNORMAL LOW (ref 1.7–7.7)
Neutrophils Relative %: 31 %
Platelets: 224 10*3/uL (ref 150–400)
RBC: 4.24 MIL/uL (ref 3.87–5.11)
RDW: 14.8 % (ref 11.5–15.5)
WBC: 3.5 10*3/uL — ABNORMAL LOW (ref 4.0–10.5)
nRBC: 0 % (ref 0.0–0.2)

## 2019-10-02 MED ORDER — SODIUM CHLORIDE 0.9% FLUSH
10.0000 mL | Freq: Once | INTRAVENOUS | Status: AC
  Administered 2019-10-02: 10 mL via INTRAVENOUS
  Filled 2019-10-02: qty 10

## 2019-10-02 MED ORDER — SODIUM CHLORIDE 0.9 % IV SOLN
Freq: Once | INTRAVENOUS | Status: AC
  Filled 2019-10-02: qty 250

## 2019-10-02 MED ORDER — HEPARIN SOD (PORK) LOCK FLUSH 100 UNIT/ML IV SOLN
INTRAVENOUS | Status: AC
  Filled 2019-10-02: qty 5

## 2019-10-02 MED ORDER — SODIUM CHLORIDE 0.9 % IV SOLN
510.0000 mg | Freq: Once | INTRAVENOUS | Status: AC
  Administered 2019-10-02: 510 mg via INTRAVENOUS
  Filled 2019-10-02: qty 510

## 2019-10-02 MED ORDER — ONDANSETRON HCL 4 MG/2ML IJ SOLN
8.0000 mg | Freq: Once | INTRAMUSCULAR | Status: AC
  Administered 2019-10-02: 8 mg via INTRAVENOUS
  Filled 2019-10-02: qty 4

## 2019-10-02 MED ORDER — CYANOCOBALAMIN 1000 MCG/ML IJ SOLN
1000.0000 ug | INTRAMUSCULAR | Status: DC
  Administered 2019-10-02: 1000 ug via INTRAMUSCULAR

## 2019-10-02 MED ORDER — HEPARIN SOD (PORK) LOCK FLUSH 100 UNIT/ML IV SOLN
500.0000 [IU] | Freq: Once | INTRAVENOUS | Status: AC
  Administered 2019-10-02: 500 [IU] via INTRAVENOUS
  Filled 2019-10-02: qty 5

## 2019-10-02 MED ORDER — LORAZEPAM 0.5 MG PO TABS: 0.5000 mg | ORAL_TABLET | Freq: Four times a day (QID) | ORAL | 0 refills | Status: DC | PRN

## 2019-10-02 MED ORDER — CYANOCOBALAMIN 1000 MCG/ML IJ SOLN
1000.0000 ug | Freq: Once | INTRAMUSCULAR | Status: AC
  Filled 2019-10-02: qty 1

## 2019-10-02 NOTE — Progress Notes (Signed)
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  Chief complaint/ Reason for visit- restless leg, anxiety  Heme/Onc history:  Oncology History  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[place])  08/19/2019 Initial Diagnosis   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[place])   09/07/2019 Cancer Staging   Staging form: Ovary, Fallopian Tube, and Primary Peritoneal Carcinoma, AJCC 8th Edition - Pathologic stage from 09/07/2019: FIGO Stage IC2, calculated as Stage IC (pT1c2, pN0, cM0) - Signed by Sindy Guadeloupe, MD on 09/07/2019   09/15/2019 -  Chemotherapy   The patient had palonosetron (ALOXI) injection 0.25 mg, 0.25 mg, Intravenous,  Once, 1 of 6 cycles Administration: 0.25 mg (09/15/2019) pegfilgrastim (NEULASTA ONPRO KIT) injection 6 mg, 6 mg, Subcutaneous, Once, 1 of 6 cycles CARBOplatin (PARAPLATIN) 350 mg in sodium chloride 0.9 % 250 mL chemo infusion, 350 mg (100 % of original dose 354.8 mg), Intravenous,  Once, 1 of 6 cycles Dose modification:   (original dose 354.8 mg, Cycle 1) fosaprepitant (EMEND) 150 mg in sodium chloride 0.9 % 145 mL IVPB, 150 mg, Intravenous,  Once, 1 of 6 cycles Administration: 150 mg (09/15/2019) PACLitaxel (TAXOL) 336 mg in sodium chloride 0.9 % 500 mL chemo infusion (> 45m/m2), 175 mg/m2 = 336 mg, Intravenous,  Once, 1 of 6 cycles Administration: 336 mg (09/15/2019)  for chemotherapy treatment.      Interval history-Carrie Roberson 74year old female diagnosed with high-grade serous carcinoma of the ovary, currently receiving  adjuvant carbo-Taxol chemotherapy, presents to symptom management clinic for complaints of restless leg and anxiety. Symptoms started after her cycle of chemotherapy and have continued since that time. She hasn't had restless leg in the past. Symptoms interfere with sleep. She feels anxious and has been taking ativan which helps but she is out of her prescription. Also previously took amitriptylline but has been holding that medication. No vomiting. Ongoing vaginal symptoms previously diagnosed as atrophic vaginitis. She tolerated neulasta well without side effects. Has itching and skin dryness which is most bothersome at night. She has been taking benadryl for this with improvement. She has reviewed her ct scan and has questions regarding results. She previously had a possible covid exposure but at time of exposure she was > 2 weeks post second vaccine and had a negative test result. She has continued quarantine and remains asymptomatic as have those in her immediately family/household.   ECOG FS:1 - Symptomatic but completely ambulatory  Review of systems- Review of Systems  Constitutional: Negative for chills, fever, malaise/fatigue and weight loss.  HENT: Negative for hearing loss, nosebleeds, sore throat and tinnitus.        Hair loss  Eyes: Negative for blurred vision and double vision.  Respiratory: Negative for cough, hemoptysis, shortness of breath and wheezing.   Cardiovascular: Negative for chest pain, palpitations and leg swelling.  Gastrointestinal: Negative for abdominal pain, blood in stool, constipation, diarrhea, melena, nausea and vomiting.  Genitourinary: Negative for dysuria and urgency.  Musculoskeletal: Negative for back pain, falls, joint pain and myalgias.  Skin: Negative for itching and rash.  Neurological: Negative for dizziness, tingling, sensory change, loss  of consciousness, weakness and headaches.  Endo/Heme/Allergies: Negative for environmental allergies. Does not  bruise/bleed easily.  Psychiatric/Behavioral: Negative for depression. The patient is nervous/anxious. The patient does not have insomnia (stable with medications).      Current treatment- carbo-taxol chemotherapy s/p cycle 1 on 09/15/19  Allergies  Allergen Reactions  . Citalopram Nausea And Vomiting  . Ivp Dye [Iodinated Diagnostic Agents] Hives and Swelling    hives  . Meloxicam     unknown  . Adhesive [Tape] Rash  . Amlodipine Rash  . Ampicillin Rash    Did it involve swelling of the face/tongue/throat, SOB, or low BP? No Did it involve sudden or severe rash/hives, skin peeling, or any reaction on the inside of your mouth or nose? Yes Did you need to seek medical attention at a hospital or doctor's office? Yes When did it last happen? 10 + years If all above answers are "NO", may proceed with cephalosporin use.  . Cefoxitin Rash  . Estradiol Rash  . Estropipate Rash  . Olmesartan Medoxomil-Hctz Rash  . Sertraline Rash       . Sulfa Antibiotics Rash    Past Medical History:  Diagnosis Date  . Anxiety   . Arthritis   . Family history of breast cancer   . Family history of leukemia   . GERD (gastroesophageal reflux disease)   . History of ischemic colitis 09/02/2017  . Hx of colonic polyp   . Hypertension    H/O-PT TAKEN OFF BY PCP AS OF 2019  . IBS (irritable bowel syndrome)   . Ovarian cancer ([place])   . Ovarian cyst   . Vaginal atrophy     Past Surgical History:  Procedure Laterality Date  . BREAST BIOPSY Left    needle bx/clip-neg  . CERVICAL BIOPSY  W/ LOOP ELECTRODE EXCISION    . CHOLECYSTECTOMY    . Colonoscopoy  2007, 2008, 2011  . COLONOSCOPY WITH PROPOFOL N/A 05/05/2018   Procedure: COLONOSCOPY WITH PROPOFOL;  Surgeon: Lin Landsman, MD;  Location: [facility];  Service: Gastroenterology;  Laterality: N/A;  . HYSTERECTOMY ABDOMINAL WITH SALPINGO-OOPHORECTOMY N/A 08/19/2019   Procedure: HYSTERECTOMY ABDOMINAL WITH SALPINGO-OOPHORECTOMY  WITH PELVIC WASHINGS PERITONEAL BIOPSIES AND PARA AORTIC LYMPH NODE DISSECTION;  Surgeon: [name], MD;  Location: [facility] ORS;  Service: Gynecology;  Laterality: N/A;  . LEEP    . OMENTECTOMY N/A 08/19/2019   Procedure: OMENTECTOMY;  Surgeon: [name], MD;  Location: [facility] ORS;  Service: Gynecology;  Laterality: N/A;  . PORTA CATH INSERTION N/A 09/11/2019   Procedure: PORTA CATH INSERTION;  Surgeon: Algernon Huxley, MD;  Location: [facility];  Service: Cardiovascular;  Laterality: N/A;  . TUBAL LIGATION  1999    Social History   Socioeconomic History  . Marital status: Single    Spouse name: Not on file  . Number of children: 0  . Years of education: Not on file  . Highest education level: Associate degree: academic program  Occupational History  . Occupation: Retired  Tobacco Use  . Smoking status: Never Smoker  . Smokeless tobacco: Never Used  . Tobacco comment: smoking cessation materials not required  Substance and Sexual Activity  . Alcohol use: Yes    Comment: rarely  . Drug use: No  . Sexual activity: Never    Birth control/protection: Post-menopausal  Other Topics Concern  . Not on file  Social History Narrative  . Not on file   Social Determinants of Health   Financial Resource Strain:   .  Difficulty of Paying Living Expenses:   Food Insecurity:   . Worried About Charity fundraiser in the Last Year:   . Arboriculturist in the Last Year:   Transportation Needs:   . Film/video editor (Medical):   Marland Kitchen Lack of Transportation (Non-Medical):   Physical Activity:   . Days of Exercise per Week:   . Minutes of Exercise per Session:   Stress:   . Feeling of Stress :   Social Connections:   . Frequency of Communication with Friends and Family:   . Frequency of Social Gatherings with Friends and Family:   . Attends Religious Services:   . Active Member of Clubs or Organizations:   . Attends Archivist Meetings:   Marland Kitchen Marital Status:    Intimate Partner Violence:   . Fear of Current or Ex-Partner:   . Emotionally Abused:   Marland Kitchen Physically Abused:   . Sexually Abused:     Family History  Problem Relation Age of Onset  . Heart failure Mother   . Atrial fibrillation Mother   . Hypertension Mother   . Diabetes Father   . Heart attack Father   . Breast cancer Maternal Aunt 70  . Breast cancer Maternal Grandmother 80  . Breast cancer Cousin        2 mat cousins  . Hypertension Sister   . Hypertension Sister   . Arthritis Sister   . Leukemia Paternal Aunt   . Leukemia Paternal Uncle   . Brain cancer Paternal Aunt   . Other Brother 13       Drowning accident  . Liver cancer Maternal Uncle   . Alcohol abuse Maternal Uncle   . Prostate cancer Neg Hx   . Kidney cancer Neg Hx      Current Outpatient Medications:  .  acetaminophen (TYLENOL) 500 MG tablet, Take 1,000 mg by mouth every 6 (six) hours as needed (for pain.)., Disp: , Rfl:  .  amitriptyline (ELAVIL) 25 MG tablet, TAKE 1 TABLET BY MOUTH EVERYDAY AT BEDTIME, Disp: 90 tablet, Rfl: 0 .  benzocaine-resorcinol (VAGISIL) 5-2 % vaginal cream, Place 1 application vaginally daily as needed for itching or irritation., Disp: , Rfl:  .  cetirizine (ZYRTEC) 10 MG tablet, Take 10 mg by mouth daily as needed (allergies.). , Disp: , Rfl:  .  dexamethasone (DECADRON) 4 MG tablet, Take 2 tablets (8 mg total) by mouth daily. Start the day after carboplatin chemotherapy for 3 days. (Patient not taking: Reported on 09/23/2019), Disp: 30 tablet, Rfl: 1 .  diphenhydrAMINE (BENADRYL) 25 MG tablet, Take 25 mg by mouth daily as needed for itching or allergies., Disp: , Rfl:  .  docusate sodium (COLACE) 100 MG capsule, Take 1 capsule (100 mg total) by mouth 2 (two) times daily., Disp: 10 capsule, Rfl: 0 .  enoxaparin (LOVENOX) 40 MG/0.4ML injection, Inject 0.4 mLs (40 mg total) into the skin daily. (Patient not taking: Reported on 09/23/2019), Disp: 8.8 mL, Rfl: 0 .  fluticasone (FLONASE) 50  MCG/ACT nasal spray, Place 2 sprays into both nostrils daily. (Patient taking differently: Place 2 sprays into both nostrils daily as needed (allergies.). ), Disp: 16 g, Rfl: 0 .  guaiFENesin (MUCINEX) 600 MG 12 hr tablet, Take 600 mg by mouth at bedtime as needed (congestion)., Disp: , Rfl:  .  hydrochlorothiazide (HYDRODIURIL) 25 MG tablet, TAKE 1 TABLET BY MOUTH EVERY DAY, Disp: 30 tablet, Rfl: 0 .  hydrocortisone cream 1 %, Apply 1  application topically daily as needed for itching., Disp: , Rfl:  .  ibuprofen (ADVIL) 600 MG tablet, Take 1 tablet (600 mg total) by mouth every 6 (six) hours as needed for fever or headache., Disp: 30 tablet, Rfl: 0 .  lidocaine-prilocaine (EMLA) cream, Apply to affected area once (Patient taking differently: Apply 1 application topically daily as needed (prior to port being accessed.). ), Disp: 30 g, Rfl: 3 .  LORazepam (ATIVAN) 0.5 MG tablet, Take 1 tablet (0.5 mg total) by mouth every 6 (six) hours as needed (Nausea or vomiting). (Patient taking differently: Take 0.5 mg by mouth every 6 (six) hours as needed (Nausea or vomiting). Nausea/vomiting), Disp: 30 tablet, Rfl: 0 .  omeprazole (PRILOSEC) 20 MG capsule, Take 20 mg by mouth daily as needed (acid reflux)., Disp: , Rfl:  .  ondansetron (ZOFRAN) 8 MG tablet, Take 1 tablet (8 mg total) by mouth 2 (two) times daily as needed for refractory nausea / vomiting. Start on day 3 after carboplatin chemo., Disp: 30 tablet, Rfl: 1 .  oxyCODONE-acetaminophen (PERCOCET/ROXICET) 5-325 MG tablet, Take 1-2 tablets by mouth every 4 (four) hours as needed for moderate pain. (Patient not taking: Reported on 09/23/2019), Disp: 40 tablet, Rfl: 0 .  potassium chloride SA (KLOR-CON) 20 MEQ tablet, Take 1 tablet (20 mEq total) by mouth daily., Disp: 14 tablet, Rfl: 0 .  prochlorperazine (COMPAZINE) 10 MG tablet, Take 1 tablet (10 mg total) by mouth every 6 (six) hours as needed (Nausea or vomiting)., Disp: 30 tablet, Rfl: 1 .  psyllium  (METAMUCIL) 0.52 g capsule, Take 0.52 g by mouth in the morning and at bedtime., Disp: , Rfl:  .  senna (SENOKOT) 8.6 MG tablet, Take 1 tablet by mouth daily., Disp: , Rfl:  .  traMADol (ULTRAM) 50 MG tablet, Take 1 tablet (50 mg total) by mouth every 6 (six) hours as needed. (Patient not taking: Reported on 09/23/2019), Disp: 60 tablet, Rfl: 0  Physical exam:  Vitals:   10/02/19 1113  BP: (!) 156/84  Pulse: 80  Resp: 18  Temp: 97.9 F (36.6 C)  TempSrc: Tympanic  SpO2: 98%  Weight: 178 lb 3.2 oz (80.8 kg)   Physical Exam Constitutional:      General: She is not in acute distress.    Appearance: She is well-developed. She is not ill-appearing.     Comments: Accompanied by friend. Wearing masks.   HENT:     Head: Atraumatic.     Comments: Hair loss    Mouth/Throat:     Pharynx: No oropharyngeal exudate.  Eyes:     General: No scleral icterus.    Conjunctiva/sclera: Conjunctivae normal.  Pulmonary:     Effort: Pulmonary effort is normal. No respiratory distress.  Abdominal:     General: Bowel sounds are normal. There is no distension.     Palpations: Abdomen is soft.  Musculoskeletal:        General: No deformity.     Comments: Ambulates w/o aids. Not weak appearing.   Skin:    General: Skin is dry.     Coloration: Skin is not pale.  Neurological:     Mental Status: She is alert and oriented to person, place, and time.  Psychiatric:        Mood and Affect: Mood is anxious.        Cognition and Memory: Cognition normal.        Judgment: Judgment normal.      CMP Latest Ref Rng & Units  10/02/2019  Glucose 70 - 99 mg/dL 106(H)  BUN 8 - 23 mg/dL 12  Creatinine 0.44 - 1.00 mg/dL 0.62  Sodium 135 - 145 mmol/L 132(L)  Potassium 3.5 - 5.1 mmol/L 3.9  Chloride 98 - 111 mmol/L 99  CO2 22 - 32 mmol/L 24  Calcium 8.9 - 10.3 mg/dL 9.9  Total Protein 6.5 - 8.1 g/dL 6.7  Total Bilirubin 0.3 - 1.2 mg/dL 0.5  Alkaline Phos 38 - 126 U/L 84  AST 15 - 41 U/L 18  ALT 0 - 44 U/L 22    CBC Latest Ref Rng & Units 10/02/2019  WBC 4.0 - 10.5 K/uL 3.5(L)  Hemoglobin 12.0 - 15.0 g/dL 12.0  Hematocrit 36.0 - 46.0 % 36.2  Platelets 150 - 400 K/uL 224    No images are attached to the encounter.  CT ABDOMEN WO CONTRAST  Result Date: 09/29/2019 CLINICAL DATA:  Ovarian cancer restaging. Lower abdominal pain. EXAM: CT CHEST AND ABDOMEN WITHOUT CONTRAST TECHNIQUE: Multidetector CT imaging of the chest and abdomen was performed following the standard protocol without IV contrast. COMPARISON:  None. FINDINGS: CT CHEST FINDINGS Cardiovascular: Port-A-Cath tip: Lower SVC. Atherosclerotic calcification of the thoracic aorta and branch vessels. Mediastinum/Nodes: Contrast medium in the distal esophagus suggesting reflux or dysmotility. Oval-shaped structure posterior to the upper thoracic esophagus, probably a lymph node, 0.7 cm in short axis on image 8/2. Nodularity along the inferior margin of the thyroid isthmus, contiguous with the thyroid gland, measuring 2.1 by 1.9 by 1.9 cm. Anterior mediastinal nodule with roughly similar density to the thyroid gland but separate from the thyroid, measuring 2.5 by 1.5 by 3.4 cm, with punctate calcifications posteriorly. Right hilar node 1.0 cm in short axis on image 73/4. Lungs/Pleura: Biapical pleuroparenchymal scarring. 0.4 by 0.2 cm nodule in the left lower lobe on image 88/3 mild airway plugging in the left upper lobe on image 43/3. 2 mm subpleural nodule along the left major fissure inferiorly on image 124/3. Linear subsegmental atelectasis or scarring peripherally in the right upper lobe on image 51/3. Musculoskeletal: Incidental Schmorl's nodes in the midthoracic spine. CT ABDOMEN PELVIS FINDINGS Hepatobiliary: Cholecystectomy. Otherwise unremarkable. Pancreas: Unremarkable Spleen: Unremarkable Adrenals/Urinary Tract: Unremarkable Stomach/Bowel: Unremarkable Vascular/Lymphatic: Aortoiliac atherosclerotic vascular disease. No pathologic adenopathy in the  upper abdomen. Other: Prior laparotomy with some faint stranding along the anterior omentum in the immediate vicinity of the laparotomy site. Musculoskeletal: Multilevel lumbar degenerative disc disease. IMPRESSION: 1. Suspected thyroid nodule along the inferior isthmus margin of the thyroid gland along with a separate anterior mediastinal fairly high density mass with punctate calcifications. 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 Thyroid scintigraphy should be considered to further characterize these lesions; if negative on scintigraphy then further workup to exclude the possibility of lymphoma or thymic tumor would be suggested. 2. Contrast medium in the distal esophagus suggesting reflux or dysmotility. 3. Several tiny pulmonary nodules are present, 3 mm in average diameter or less. No follow-up needed if patient is low-risk (and has no known or suspected primary neoplasm). Non-contrast chest CT can be considered in 12 months if patient is high-risk. This recommendation follows the consensus statement: Guidelines for Management of Incidental Pulmonary Nodules Detected on CT Images: From the Fleischner Society 2017; Radiology 2017; 284:228-243. 4. Aortoiliac atherosclerotic vascular disease. 5. Multilevel lumbar degenerative disc disease. 6. Prior laparotomy with some faint stranding along the anterior omentum in the immediate vicinity of the laparotomy site, likely  from benign scarring. Aortic Atherosclerosis (ICD10-I70.0). Electronically Signed   By: Van Clines M.D.   On: 09/29/2019 13:08   CT CHEST WO CONTRAST  Result Date: 09/29/2019 CLINICAL DATA:  Ovarian cancer restaging. Lower abdominal pain. EXAM: CT CHEST AND ABDOMEN WITHOUT CONTRAST TECHNIQUE: Multidetector CT imaging of the chest and abdomen was performed following the standard protocol without IV contrast. COMPARISON:  None.  FINDINGS: CT CHEST FINDINGS Cardiovascular: Port-A-Cath tip: Lower SVC. Atherosclerotic calcification of the thoracic aorta and branch vessels. Mediastinum/Nodes: Contrast medium in the distal esophagus suggesting reflux or dysmotility. Oval-shaped structure posterior to the upper thoracic esophagus, probably a lymph node, 0.7 cm in short axis on image 8/2. Nodularity along the inferior margin of the thyroid isthmus, contiguous with the thyroid gland, measuring 2.1 by 1.9 by 1.9 cm. Anterior mediastinal nodule with roughly similar density to the thyroid gland but separate from the thyroid, measuring 2.5 by 1.5 by 3.4 cm, with punctate calcifications posteriorly. Right hilar node 1.0 cm in short axis on image 73/4. Lungs/Pleura: Biapical pleuroparenchymal scarring. 0.4 by 0.2 cm nodule in the left lower lobe on image 88/3 mild airway plugging in the left upper lobe on image 43/3. 2 mm subpleural nodule along the left major fissure inferiorly on image 124/3. Linear subsegmental atelectasis or scarring peripherally in the right upper lobe on image 51/3. Musculoskeletal: Incidental Schmorl's nodes in the midthoracic spine. CT ABDOMEN PELVIS FINDINGS Hepatobiliary: Cholecystectomy. Otherwise unremarkable. Pancreas: Unremarkable Spleen: Unremarkable Adrenals/Urinary Tract: Unremarkable Stomach/Bowel: Unremarkable Vascular/Lymphatic: Aortoiliac atherosclerotic vascular disease. No pathologic adenopathy in the upper abdomen. Other: Prior laparotomy with some faint stranding along the anterior omentum in the immediate vicinity of the laparotomy site. Musculoskeletal: Multilevel lumbar degenerative disc disease. IMPRESSION: 1. Suspected thyroid nodule along the inferior isthmus margin of the thyroid gland along with a separate anterior mediastinal fairly high density mass with punctate calcifications. 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 Thyroid scintigraphy should be considered to further characterize these lesions; if negative on scintigraphy then further workup to exclude the possibility of lymphoma or thymic tumor would be suggested. 2. Contrast medium in the distal esophagus suggesting reflux or dysmotility. 3. Several tiny pulmonary nodules are present, 3 mm in average diameter or less. No follow-up needed if patient is low-risk (and has no known or suspected primary neoplasm). Non-contrast chest CT can be considered in 12 months if patient is high-risk. This recommendation follows the consensus statement: Guidelines for Management of Incidental Pulmonary Nodules Detected on CT Images: From the Fleischner Society 2017; Radiology 2017; 284:228-243. 4. Aortoiliac atherosclerotic vascular disease. 5. Multilevel lumbar degenerative disc disease. 6. Prior laparotomy with some faint stranding along the anterior omentum in the immediate vicinity of the laparotomy site, likely from benign scarring. Aortic Atherosclerosis (ICD10-I70.0). Electronically Signed   By: Van Clines M.D.   On: 09/29/2019 13:08   PERIPHERAL VASCULAR CATHETERIZATION  Result Date: 09/11/2019 See op note   Assessment and plan- Patient is a 74 y.o. 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   1. Restless Leg- likely symptomatic of iron deficiency. Hemoglobin lower limit of normal today at 12. Hct 36.2. Ferritin 20, saturation 8, TIBC 396. Start Feraheme to assess for symptom improvement. Once resolution of iron deficiency, if symptoms persist, could consider dopamine agonist such as  pramipexole or ropinirole trial.   2. Anxiety- acutely worse secondary to cancer diagnosis and treatments. Ok to restart amitriptyline. Anxiety also likely contributing to nausea (see  below).   3. Chemotherapy induced nausea- likely anxiety also contributing. Will give zofran in clinic today and refill ativan 0.5 mg q8h prn.   4. b12 deficiency- b12 level was low at 176 on 09/15/19. Will plan to give b12 today.   5. Thyroid Nodule- incidental finding on imaging from 09/29/19. Will defer to Dr. Janese Banks for further discussion and management. Patient scheduled to f/u w/ Dr. Janese Banks on 3/16.   6. Hyponatremia- sodium 132 today. Improved from previous. Will give fluids with infusions today. Encouraged oral hydration.   Disposition:  IV fluids, Feraheme, B12, and zofran in clinic today. Follow up with Dr. Janese Banks as scheduled. RTC sooner if symptoms do not improve or worsen.    Visit Diagnosis 1. Iron deficiency   2. 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HCC)   3. Anxiety associated with cancer diagnosis   4. Chemotherapy-induced nausea   5. B12 deficiency   6. Thyroid nodule   7. Hyponatremia     Patient expressed understanding and was in agreement with this plan. She also understands that She can call clinic at any time with any questions, concerns, or complaints.   Thank you for allowing me to participate in the care of this very pleasant patient.   Beckey Rutter, DNP, AGNP-C [facility]  CC: Dr. Janese Banks

## 2019-10-06 ENCOUNTER — Encounter: Payer: Self-pay | Admitting: *Deleted

## 2019-10-06 ENCOUNTER — Inpatient Hospital Stay: Payer: Medicare Other

## 2019-10-06 ENCOUNTER — Encounter: Payer: Self-pay | Admitting: Oncology

## 2019-10-06 ENCOUNTER — Inpatient Hospital Stay (HOSPITAL_BASED_OUTPATIENT_CLINIC_OR_DEPARTMENT_OTHER): Payer: Medicare Other | Admitting: Oncology

## 2019-10-06 ENCOUNTER — Other Ambulatory Visit: Payer: Self-pay

## 2019-10-06 VITALS — BP 103/61 | HR 74 | Temp 97.5°F | Resp 16 | Wt 180.0 lb

## 2019-10-06 DIAGNOSIS — E538 Deficiency of other specified B group vitamins: Secondary | ICD-10-CM | POA: Diagnosis not present

## 2019-10-06 DIAGNOSIS — G2581 Restless legs syndrome: Secondary | ICD-10-CM

## 2019-10-06 DIAGNOSIS — D509 Iron deficiency anemia, unspecified: Secondary | ICD-10-CM | POA: Diagnosis not present

## 2019-10-06 DIAGNOSIS — Z5111 Encounter for antineoplastic chemotherapy: Secondary | ICD-10-CM | POA: Diagnosis not present

## 2019-10-06 DIAGNOSIS — D701 Agranulocytosis secondary to cancer chemotherapy: Secondary | ICD-10-CM | POA: Diagnosis not present

## 2019-10-06 DIAGNOSIS — C561 Malignant neoplasm of right ovary: Secondary | ICD-10-CM | POA: Diagnosis not present

## 2019-10-06 DIAGNOSIS — D709 Neutropenia, unspecified: Secondary | ICD-10-CM | POA: Diagnosis not present

## 2019-10-06 DIAGNOSIS — Z5189 Encounter for other specified aftercare: Secondary | ICD-10-CM | POA: Diagnosis not present

## 2019-10-06 LAB — CBC WITH DIFFERENTIAL/PLATELET
Abs Immature Granulocytes: 0.01 10*3/uL (ref 0.00–0.07)
Basophils Absolute: 0 10*3/uL (ref 0.0–0.1)
Basophils Relative: 0 %
Eosinophils Absolute: 0.1 10*3/uL (ref 0.0–0.5)
Eosinophils Relative: 1 %
HCT: 36.2 % (ref 36.0–46.0)
Hemoglobin: 12 g/dL (ref 12.0–15.0)
Immature Granulocytes: 0 %
Lymphocytes Relative: 42 %
Lymphs Abs: 2.1 10*3/uL (ref 0.7–4.0)
MCH: 28.3 pg (ref 26.0–34.0)
MCHC: 33.1 g/dL (ref 30.0–36.0)
MCV: 85.4 fL (ref 80.0–100.0)
Monocytes Absolute: 0.9 10*3/uL (ref 0.1–1.0)
Monocytes Relative: 18 %
Neutro Abs: 2 10*3/uL (ref 1.7–7.7)
Neutrophils Relative %: 39 %
Platelets: 295 10*3/uL (ref 150–400)
RBC: 4.24 MIL/uL (ref 3.87–5.11)
RDW: 15 % (ref 11.5–15.5)
WBC: 5.1 10*3/uL (ref 4.0–10.5)
nRBC: 0 % (ref 0.0–0.2)

## 2019-10-06 LAB — COMPREHENSIVE METABOLIC PANEL
ALT: 25 U/L (ref 0–44)
AST: 20 U/L (ref 15–41)
Albumin: 3.9 g/dL (ref 3.5–5.0)
Alkaline Phosphatase: 83 U/L (ref 38–126)
Anion gap: 9 (ref 5–15)
BUN: 20 mg/dL (ref 8–23)
CO2: 26 mmol/L (ref 22–32)
Calcium: 10.1 mg/dL (ref 8.9–10.3)
Chloride: 96 mmol/L — ABNORMAL LOW (ref 98–111)
Creatinine, Ser: 0.89 mg/dL (ref 0.44–1.00)
GFR calc Af Amer: >60 mL/min (ref >60)
GFR calc non Af Amer: >60 mL/min (ref >60)
Glucose, Bld: 108 mg/dL — ABNORMAL HIGH (ref 70–99)
Potassium: 3.5 mmol/L (ref 3.5–5.1)
Sodium: 131 mmol/L — ABNORMAL LOW (ref 135–145)
Total Bilirubin: 0.6 mg/dL (ref 0.3–1.2)
Total Protein: 6.5 g/dL (ref 6.5–8.1)

## 2019-10-06 MED ORDER — FAMOTIDINE IN NACL 20-0.9 MG/50ML-% IV SOLN
20.0000 mg | Freq: Once | INTRAVENOUS | Status: AC
  Administered 2019-10-06: 20 mg via INTRAVENOUS
  Filled 2019-10-06: qty 50

## 2019-10-06 MED ORDER — HEPARIN SOD (PORK) LOCK FLUSH 100 UNIT/ML IV SOLN
INTRAVENOUS | Status: AC
  Filled 2019-10-06: qty 5

## 2019-10-06 MED ORDER — SODIUM CHLORIDE 0.9 % IV SOLN
175.0000 mg/m2 | Freq: Once | INTRAVENOUS | Status: AC
  Administered 2019-10-06: 336 mg via INTRAVENOUS
  Filled 2019-10-06: qty 56

## 2019-10-06 MED ORDER — PALONOSETRON HCL INJECTION 0.25 MG/5ML
0.2500 mg | Freq: Once | INTRAVENOUS | Status: AC
  Administered 2019-10-06: 0.25 mg via INTRAVENOUS
  Filled 2019-10-06: qty 5

## 2019-10-06 MED ORDER — HEPARIN SOD (PORK) LOCK FLUSH 100 UNIT/ML IV SOLN
500.0000 [IU] | Freq: Once | INTRAVENOUS | Status: AC | PRN
  Administered 2019-10-06: 500 [IU]
  Filled 2019-10-06: qty 5

## 2019-10-06 MED ORDER — SODIUM CHLORIDE 0.9 % IV SOLN
10.0000 mg | Freq: Once | INTRAVENOUS | Status: AC
  Administered 2019-10-06: 10 mg via INTRAVENOUS
  Filled 2019-10-06: qty 10

## 2019-10-06 MED ORDER — SODIUM CHLORIDE 0.9 % IV SOLN
Freq: Once | INTRAVENOUS | Status: AC
  Filled 2019-10-06: qty 250

## 2019-10-06 MED ORDER — SODIUM CHLORIDE 0.9 % IV SOLN
150.0000 mg | Freq: Once | INTRAVENOUS | Status: AC
  Administered 2019-10-06: 150 mg via INTRAVENOUS
  Filled 2019-10-06: qty 150

## 2019-10-06 MED ORDER — SODIUM CHLORIDE 0.9 % IV SOLN
354.8000 mg | Freq: Once | INTRAVENOUS | Status: AC
  Administered 2019-10-06: 350 mg via INTRAVENOUS
  Filled 2019-10-06: qty 35

## 2019-10-06 MED ORDER — SODIUM CHLORIDE 0.9% FLUSH
10.0000 mL | Freq: Once | INTRAVENOUS | Status: AC
  Administered 2019-10-06: 10 mL via INTRAVENOUS
  Filled 2019-10-06: qty 10

## 2019-10-06 MED ORDER — HEPARIN SOD (PORK) LOCK FLUSH 100 UNIT/ML IV SOLN
500.0000 [IU] | Freq: Once | INTRAVENOUS | Status: DC
  Filled 2019-10-06: qty 5

## 2019-10-06 MED ORDER — PEGFILGRASTIM 6 MG/0.6ML ~~LOC~~ PSKT
6.0000 mg | PREFILLED_SYRINGE | Freq: Once | SUBCUTANEOUS | Status: AC
  Administered 2019-10-06: 6 mg via SUBCUTANEOUS
  Filled 2019-10-06: qty 0.6

## 2019-10-06 MED ORDER — DIPHENHYDRAMINE HCL 50 MG/ML IJ SOLN
50.0000 mg | Freq: Once | INTRAMUSCULAR | Status: AC
  Administered 2019-10-06: 50 mg via INTRAVENOUS
  Filled 2019-10-06: qty 1

## 2019-10-06 NOTE — Progress Notes (Signed)
Pt states that at her surgery area-her right groin is painful and itching at times today 5.

## 2019-10-06 NOTE — Research (Signed)
Patient Carrie Roberson was approached in clinic and greed to participate in the DCP-001 study related to her recent decline for participation in the [id number] [id number] clinical trial. Informed consent form for Protocol Version Date: 01/13/19 of the DCP-001 study was reviewed page-by-page with the patient. She was informed that participation is voluntary and that she can withdraw at any time and for any reason, that she will not be paid for participation, and discussed risks, benefits and how her personal information will be kept private. [name] was by herself during the consent discussion. She provided written consent for the DCP-001 Protocol Version Date: 01/13/19 and corresponding HIPAA consent dated 09/13/2014. Patient completed the DCP-001 worksheet while in clinic as well. Patient was thanked for participating in Oncology research. Yolande Jolly, BSN, MHA, OCN 10/06/2019 9:50 AM

## 2019-10-08 ENCOUNTER — Other Ambulatory Visit: Payer: Self-pay | Admitting: Obstetrics and Gynecology

## 2019-10-08 NOTE — Progress Notes (Signed)
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 This was followed by an MR pelvis with and without contrast in December 2020 which showed a large cystic mass arising from the right ovary measuring 1.8 cm. This was followed by TAH/BSO with peritoneal biopsies with pelvic/aortic lymph node dissection pelvic washings and omentectomy with Dr. Fransisca Connors and Dr. Georgianne Fick on 08/19/2019. 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 Angiolymphatic invasion is present. Bilateral fallopian tubes were negative for atypia and malignancy. Peritoneal stone excision negative for malignancy. Uterus with cervix negative for atypia and malignancy. Bladder, right gutter negative for malignancy. 5 lymph nodes were sampled and were negative for malignancy. Pelvic washings were also negative for malignancy. Pathologic staging FIGO1 C2  Patient was seen by Dr. Fransisca Connors postoperatively and recommendation was to proceed with adjuvant chemotherapy carbotaxol for 6 cycles.   Interval history-patient was exposed to family members who had Covid but patient herself was vaccinated and also had a negative Covid test result.  She reports that her  restless leg syndrome symptoms are somewhat better after receiving first dose of IV iron.  Denies any significant nausea or vomiting or tingling numbness in her extremities.  ECOG PS- 1 Pain scale- 0   Review of systems- Review of Systems  Constitutional: Positive for malaise/fatigue. Negative for chills, fever and weight loss.  HENT: Negative for congestion, ear discharge and nosebleeds.   Eyes: Negative for blurred vision.  Respiratory: Negative for cough, hemoptysis, sputum production, shortness of breath and wheezing.   Cardiovascular: Negative for chest pain, palpitations, orthopnea and claudication.  Gastrointestinal: Negative for abdominal pain, blood in stool, constipation, diarrhea, heartburn, melena, nausea and vomiting.  Genitourinary: Negative for dysuria, flank pain, frequency, hematuria and urgency.  Musculoskeletal: Negative for back pain, joint pain and myalgias.  Skin: Negative for rash.  Neurological: Negative for dizziness, tingling, focal weakness, seizures, weakness and headaches.  Endo/Heme/Allergies: Does not bruise/bleed easily.  Psychiatric/Behavioral: Negative for depression and suicidal ideas. The patient is nervous/anxious. The patient does not have insomnia.       Allergies  Allergen Reactions   Citalopram Nausea And Vomiting   Ivp Dye [Iodinated Diagnostic Agents] Hives and Swelling    hives   Meloxicam     unknown   Adhesive [Tape] Rash   Amlodipine Rash   Ampicillin Rash    Did it involve swelling of the face/tongue/throat, SOB, or low BP? No Did it involve sudden or severe rash/hives, skin peeling, or any reaction on the inside of your mouth or nose? Yes Did you need to seek medical attention at a hospital or doctor's office? Yes When did it last happen? 10 + years If all above answers are NO, may proceed with cephalosporin use.   Cefoxitin Rash   Estradiol Rash   Estropipate Rash  Olmesartan Medoxomil-Hctz Rash    Sertraline Rash        Sulfa Antibiotics Rash     Past Medical History:  Diagnosis Date   Anxiety    Arthritis    Family history of breast cancer    Family history of leukemia    GERD (gastroesophageal reflux disease)    History of ischemic colitis 09/02/2017   Hx of colonic polyp    Hypertension    H/O-PT TAKEN OFF BY PCP AS OF 2019   IBS (irritable bowel syndrome)    Ovarian cancer ([place])    Ovarian cyst    Vaginal atrophy      Past Surgical History:  Procedure Laterality Date   BREAST BIOPSY Left    needle bx/clip-neg   CERVICAL BIOPSY  W/ LOOP ELECTRODE EXCISION     CHOLECYSTECTOMY     Colonoscopoy  2007, 2008, 2011   COLONOSCOPY WITH PROPOFOL N/A 05/05/2018   Procedure: COLONOSCOPY WITH PROPOFOL;  Surgeon: Lin Landsman, MD;  Location: [facility];  Service: Gastroenterology;  Laterality: N/A;   HYSTERECTOMY ABDOMINAL WITH SALPINGO-OOPHORECTOMY N/A 08/19/2019   Procedure: HYSTERECTOMY ABDOMINAL WITH SALPINGO-OOPHORECTOMY WITH PELVIC WASHINGS PERITONEAL BIOPSIES AND PARA AORTIC LYMPH NODE DISSECTION;  Surgeon: [name], MD;  Location: [facility];  Service: Gynecology;  Laterality: N/A;   LEEP     OMENTECTOMY N/A 08/19/2019   Procedure: OMENTECTOMY;  Surgeon: [name], MD;  Location: [facility];  Service: Gynecology;  Laterality: N/A;   PORTA CATH INSERTION N/A 09/11/2019   Procedure: PORTA CATH INSERTION;  Surgeon: Algernon Huxley, MD;  Location: [facility];  Service: Cardiovascular;  Laterality: N/A;   TUBAL LIGATION  1999    Social History   Socioeconomic History   Marital status: Single    Spouse name: Not on file   Number of children: 0   Years of education: Not on file   Highest education level: Associate degree: academic program  Occupational History   Occupation: Retired  Tobacco Use   Smoking status: Never Smoker   Smokeless tobacco: Never Used   Tobacco comment: smoking cessation materials  not required  Substance and Sexual Activity   Alcohol use: Yes    Comment: rarely   Drug use: No   Sexual activity: Never    Birth control/protection: Post-menopausal  Other Topics Concern   Not on file  Social History Narrative   Not on file   Social Determinants of Health   Financial Resource Strain:    Difficulty of Paying Living Expenses:   Food Insecurity:    Worried About Charity fundraiser in the Last Year:    Arboriculturist in the Last Year:   Transportation Needs:    Film/video editor (Medical):    Lack of Transportation (Non-Medical):   Physical Activity:    Days of Exercise per Week:    Minutes of Exercise per Session:   Stress:    Feeling of Stress :   Social Connections:    Frequency of Communication with Friends and Family:    Frequency of Social Gatherings with Friends and Family:    Attends Religious Services:    Active Member of Clubs or Organizations:    Attends Music therapist:    Marital Status:   Intimate Partner Violence:    Fear of Current or Ex-Partner:    Emotionally Abused:    Physically Abused:    Sexually Abused:     Family History  Problem Relation  Age of Onset   Heart failure Mother    Atrial fibrillation Mother    Hypertension Mother    Diabetes Father    Heart attack Father    Breast cancer Maternal Aunt 65   Breast cancer Maternal Grandmother 109   Breast cancer Cousin        2 mat cousins   Hypertension Sister    Hypertension Sister    Arthritis Sister    Leukemia Paternal Aunt    Leukemia Paternal Uncle    Brain cancer Paternal Aunt    Other Brother 68       Drowning accident   Liver cancer Maternal Uncle    Alcohol abuse Maternal Uncle    Prostate cancer Neg Hx    Kidney cancer Neg Hx      Current Outpatient Medications:    acetaminophen (TYLENOL) 500 MG tablet, Take 1,000 mg by mouth every 6 (six) hours as needed (for pain.)., Disp: , Rfl:     amitriptyline (ELAVIL) 25 MG tablet, TAKE 1 TABLET BY MOUTH EVERYDAY AT BEDTIME, Disp: 90 tablet, Rfl: 0   benzocaine-resorcinol (VAGISIL) 5-2 % vaginal cream, Place 1 application vaginally daily as needed for itching or irritation., Disp: , Rfl:    cetirizine (ZYRTEC) 10 MG tablet, Take 10 mg by mouth daily as needed (allergies.). , Disp: , Rfl:    dexamethasone (DECADRON) 4 MG tablet, Take 2 tablets (8 mg total) by mouth daily. Start the day after carboplatin chemotherapy for 3 days., Disp: 30 tablet, Rfl: 1   diphenhydrAMINE (BENADRYL) 25 MG tablet, Take 25 mg by mouth daily as needed for itching or allergies., Disp: , Rfl:    fluticasone (FLONASE) 50 MCG/ACT nasal spray, Place 2 sprays into both nostrils daily. (Patient taking differently: Place 2 sprays into both nostrils daily as needed (allergies.). ), Disp: 16 g, Rfl: 0   guaiFENesin (MUCINEX) 600 MG 12 hr tablet, Take 600 mg by mouth at bedtime as needed (congestion)., Disp: , Rfl:    hydrochlorothiazide (HYDRODIURIL) 25 MG tablet, TAKE 1 TABLET BY MOUTH EVERY DAY, Disp: 30 tablet, Rfl: 0   hydrocortisone cream 1 %, Apply 1 application topically daily as needed for itching., Disp: , Rfl:    lidocaine-prilocaine (EMLA) cream, Apply to affected area once (Patient taking differently: Apply 1 application topically daily as needed (prior to port being accessed.). ), Disp: 30 g, Rfl: 3   LORazepam (ATIVAN) 0.5 MG tablet, Take 1 tablet (0.5 mg total) by mouth every 6 (six) hours as needed (Nausea/vomiting or anxiety)., Disp: 90 tablet, Rfl: 0   omeprazole (PRILOSEC) 20 MG capsule, Take 20 mg by mouth daily as needed (acid reflux)., Disp: , Rfl:    ondansetron (ZOFRAN) 8 MG tablet, Take 1 tablet (8 mg total) by mouth 2 (two) times daily as needed for refractory nausea / vomiting. Start on day 3 after carboplatin chemo., Disp: 30 tablet, Rfl: 1   polyethylene glycol (MIRALAX / GLYCOLAX) 17 g packet, Take 17 g by mouth 2 (two) times daily.,  Disp: , Rfl:    potassium chloride SA (KLOR-CON) 20 MEQ tablet, Take 1 tablet (20 mEq total) by mouth daily., Disp: 14 tablet, Rfl: 0   prochlorperazine (COMPAZINE) 10 MG tablet, Take 1 tablet (10 mg total) by mouth every 6 (six) hours as needed (Nausea or vomiting)., Disp: 30 tablet, Rfl: 1   senna (SENOKOT) 8.6 MG tablet, Take 1 tablet by mouth daily as needed. , Disp: , Rfl:    oxyCODONE-acetaminophen (PERCOCET/ROXICET) 5-325  MG tablet, Take 1-2 tablets by mouth every 4 (four) hours as needed for moderate pain. (Patient not taking: Reported on 09/23/2019), Disp: 40 tablet, Rfl: 0   psyllium (METAMUCIL) 0.52 g capsule, Take 0.52 g by mouth in the morning and at bedtime., Disp: , Rfl:    traMADol (ULTRAM) 50 MG tablet, Take 1 tablet (50 mg total) by mouth every 6 (six) hours as needed. (Patient not taking: Reported on 09/23/2019), Disp: 60 tablet, Rfl: 0  Physical exam:  Vitals:   10/06/19 0850 10/06/19 0852  BP: 103/61   Pulse: 74   Resp: 16   Temp:  (!) 97.5 F (36.4 C)  TempSrc:  Tympanic  Weight: 180 lb (81.6 kg)    Physical Exam Cardiovascular:     Rate and Rhythm: Normal rate and regular rhythm.     Heart sounds: Normal heart sounds.  Pulmonary:     Effort: Pulmonary effort is normal.     Breath sounds: Normal breath sounds.  Abdominal:     General: Bowel sounds are normal.     Palpations: Abdomen is soft.  Skin:    General: Skin is warm and dry.  Neurological:     Mental Status: She is alert and oriented to person, place, and time.      CMP Latest Ref Rng & Units 10/06/2019  Glucose 70 - 99 mg/dL 108(H)  BUN 8 - 23 mg/dL 20  Creatinine 0.44 - 1.00 mg/dL 0.89  Sodium 135 - 145 mmol/L 131(L)  Potassium 3.5 - 5.1 mmol/L 3.5  Chloride 98 - 111 mmol/L 96(L)  CO2 22 - 32 mmol/L 26  Calcium 8.9 - 10.3 mg/dL 10.1  Total Protein 6.5 - 8.1 g/dL 6.5  Total Bilirubin 0.3 - 1.2 mg/dL 0.6  Alkaline Phos 38 - 126 U/L 83  AST 15 - 41 U/L 20  ALT 0 - 44 U/L 25   CBC Latest  Ref Rng & Units 10/06/2019  WBC 4.0 - 10.5 K/uL 5.1  Hemoglobin 12.0 - 15.0 g/dL 12.0  Hematocrit 36.0 - 46.0 % 36.2  Platelets 150 - 400 K/uL 295    No images are attached to the encounter.  CT ABDOMEN WO CONTRAST  Result Date: 09/29/2019 CLINICAL DATA:  Ovarian cancer restaging. Lower abdominal pain. EXAM: CT CHEST AND ABDOMEN WITHOUT CONTRAST TECHNIQUE: Multidetector CT imaging of the chest and abdomen was performed following the standard protocol without IV contrast. COMPARISON:  None. FINDINGS: CT CHEST FINDINGS Cardiovascular: Port-A-Cath tip: Lower SVC. Atherosclerotic calcification of the thoracic aorta and branch vessels. Mediastinum/Nodes: Contrast medium in the distal esophagus suggesting reflux or dysmotility. Oval-shaped structure posterior to the upper thoracic esophagus, probably a lymph node, 0.7 cm in short axis on image 8/2. Nodularity along the inferior margin of the thyroid isthmus, contiguous with the thyroid gland, measuring 2.1 by 1.9 by 1.9 cm. Anterior mediastinal nodule with roughly similar density to the thyroid gland but separate from the thyroid, measuring 2.5 by 1.5 by 3.4 cm, with punctate calcifications posteriorly. Right hilar node 1.0 cm in short axis on image 73/4. Lungs/Pleura: Biapical pleuroparenchymal scarring. 0.4 by 0.2 cm nodule in the left lower lobe on image 88/3 mild airway plugging in the left upper lobe on image 43/3. 2 mm subpleural nodule along the left major fissure inferiorly on image 124/3. Linear subsegmental atelectasis or scarring peripherally in the right upper lobe on image 51/3. Musculoskeletal: Incidental Schmorl's nodes in the midthoracic spine. CT ABDOMEN PELVIS FINDINGS Hepatobiliary: Cholecystectomy. Otherwise unremarkable. Pancreas: Unremarkable Spleen: Unremarkable  Adrenals/Urinary Tract: Unremarkable Stomach/Bowel: Unremarkable Vascular/Lymphatic: Aortoiliac atherosclerotic vascular disease. No pathologic adenopathy in the upper abdomen.  Other: Prior laparotomy with some faint stranding along the anterior omentum in the immediate vicinity of the laparotomy site. Musculoskeletal: Multilevel lumbar degenerative disc disease. IMPRESSION: 1. Suspected thyroid nodule along the inferior isthmus margin of the thyroid gland along with a separate anterior mediastinal fairly high density mass with punctate calcifications. 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 Thyroid scintigraphy should be considered to further characterize these lesions; if negative on scintigraphy then further workup to exclude the possibility of lymphoma or thymic tumor would be suggested. 2. Contrast medium in the distal esophagus suggesting reflux or dysmotility. 3. Several tiny pulmonary nodules are present, 3 mm in average diameter or less. No follow-up needed if patient is low-risk (and has no known or suspected primary neoplasm). Non-contrast chest CT can be considered in 12 months if patient is high-risk. This recommendation follows the consensus statement: Guidelines for Management of Incidental Pulmonary Nodules Detected on CT Images: From the Fleischner Society 2017; Radiology 2017; 284:228-243. 4. Aortoiliac atherosclerotic vascular disease. 5. Multilevel lumbar degenerative disc disease. 6. Prior laparotomy with some faint stranding along the anterior omentum in the immediate vicinity of the laparotomy site, likely from benign scarring. Aortic Atherosclerosis (ICD10-I70.0). Electronically Signed   By: Van Clines M.D.   On: 09/29/2019 13:08   CT CHEST WO CONTRAST  Result Date: 09/29/2019 CLINICAL DATA:  Ovarian cancer restaging. Lower abdominal pain. EXAM: CT CHEST AND ABDOMEN WITHOUT CONTRAST TECHNIQUE: Multidetector CT imaging of the chest and abdomen was performed following the standard protocol without IV contrast. COMPARISON:  None. FINDINGS: CT CHEST  FINDINGS Cardiovascular: Port-A-Cath tip: Lower SVC. Atherosclerotic calcification of the thoracic aorta and branch vessels. Mediastinum/Nodes: Contrast medium in the distal esophagus suggesting reflux or dysmotility. Oval-shaped structure posterior to the upper thoracic esophagus, probably a lymph node, 0.7 cm in short axis on image 8/2. Nodularity along the inferior margin of the thyroid isthmus, contiguous with the thyroid gland, measuring 2.1 by 1.9 by 1.9 cm. Anterior mediastinal nodule with roughly similar density to the thyroid gland but separate from the thyroid, measuring 2.5 by 1.5 by 3.4 cm, with punctate calcifications posteriorly. Right hilar node 1.0 cm in short axis on image 73/4. Lungs/Pleura: Biapical pleuroparenchymal scarring. 0.4 by 0.2 cm nodule in the left lower lobe on image 88/3 mild airway plugging in the left upper lobe on image 43/3. 2 mm subpleural nodule along the left major fissure inferiorly on image 124/3. Linear subsegmental atelectasis or scarring peripherally in the right upper lobe on image 51/3. Musculoskeletal: Incidental Schmorl's nodes in the midthoracic spine. CT ABDOMEN PELVIS FINDINGS Hepatobiliary: Cholecystectomy. Otherwise unremarkable. Pancreas: Unremarkable Spleen: Unremarkable Adrenals/Urinary Tract: Unremarkable Stomach/Bowel: Unremarkable Vascular/Lymphatic: Aortoiliac atherosclerotic vascular disease. No pathologic adenopathy in the upper abdomen. Other: Prior laparotomy with some faint stranding along the anterior omentum in the immediate vicinity of the laparotomy site. Musculoskeletal: Multilevel lumbar degenerative disc disease. IMPRESSION: 1. Suspected thyroid nodule along the inferior isthmus margin of the thyroid gland along with a separate anterior mediastinal fairly high density mass with punctate calcifications. 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 Thyroid scintigraphy should be considered to further characterize these lesions; if negative on scintigraphy then further workup to exclude the possibility of lymphoma or thymic tumor would be suggested. 2. Contrast  medium in the distal esophagus suggesting reflux or dysmotility. 3. Several tiny pulmonary nodules are present, 3 mm in average diameter or less. No follow-up needed if patient is low-risk (and has no known or suspected primary neoplasm). Non-contrast chest CT can be considered in 12 months if patient is high-risk. This recommendation follows the consensus statement: Guidelines for Management of Incidental Pulmonary Nodules Detected on CT Images: From the Fleischner Society 2017; Radiology 2017; 284:228-243. 4. Aortoiliac atherosclerotic vascular disease. 5. Multilevel lumbar degenerative disc disease. 6. Prior laparotomy with some faint stranding along the anterior omentum in the immediate vicinity of the laparotomy site, likely from benign scarring. Aortic Atherosclerosis (ICD10-I70.0). Electronically Signed   By: Van Clines M.D.   On: 09/29/2019 13:08   PERIPHERAL VASCULAR CATHETERIZATION  Result Date: 09/11/2019 See op note    Assessment and plan- Patient is a 74 y.o. 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  She is here for on treatment assessment prior to cycle 2 of adjuvant carbotaxol chemotherapy  Patient's white cell count is 5.1 today with an [place] of 2.  Counts with appropriate to proceed with cycle 2 of carbotaxol chemotherapy today with on pro-Neulasta support.  She will continue to receive carboplatin at a reduced dose of AUC 4.  Restless leg syndrome: She will receive dose 2 Feraheme with next treatment.  I will see her back in 3 weeks time with CBC with differential, CMP for cycle 3 of carbotaxol chemotherapy with on pro-Neulasta support  Patient does have baseline neutropenia of unclear etiology which we  will continue to monitor.   Visit Diagnosis 1. Encounter for antineoplastic chemotherapy   2. Neutropenia, unspecified type ([place])   3. Restless leg syndrome   4. 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[place])      Dr. Randa Evens, MD, MPH [facility] at [facility] [id number] 10/08/2019 7:41 PM

## 2019-10-08 NOTE — Telephone Encounter (Signed)
Advise

## 2019-10-09 ENCOUNTER — Telehealth: Payer: Self-pay | Admitting: *Deleted

## 2019-10-09 MED ORDER — ROPINIROLE HCL 0.25 MG PO TABS: 0.2500 mg | ORAL_TABLET | Freq: Every day | ORAL | 0 refills | Status: DC

## 2019-10-09 NOTE — Telephone Encounter (Signed)
Called pt to ask about bumping up her appt 4/6 so we can add on the last iron treatment. Mover her appt up to 8:15 labs and then see md and start tx. She is agreeable to this. I asked about how chemo going and she said the restless legs is still bothering her. She states it keeps her from resting and she does not move around as good for 5-6 days. She is taking OTC pain relievers but not helping her. I called Janese Banks and she said to give requip .25 mg once day at night call 7 tablets in and if it works she can have more. I let pt know and sent rx in for her. Pt aware

## 2019-10-12 ENCOUNTER — Telehealth: Payer: Self-pay | Admitting: *Deleted

## 2019-10-12 NOTE — Telephone Encounter (Signed)
Spoke to patient. Feel she needs in person evaluation. Doni- please contact patient for labs, [facility], and possible fluids for tomorrow. Thanks!

## 2019-10-12 NOTE — Telephone Encounter (Signed)
Spoke to patient via telephone. She is coming in tomorrow at 9:00 for port labs/smc visit/possible fluids.

## 2019-10-12 NOTE — Telephone Encounter (Signed)
Patient called asking for an order for a walker as she feels very shaky the first week after her chemotherapy. Also reports that she is out of potassium and that she has a hard time swallowing the large tabs "it chokes me" and is asking for it in a different form. Please advise

## 2019-10-12 NOTE — Telephone Encounter (Signed)
Can you call her and see if she wants to come in for ivf? Also check with her pharmacy if liquid potassium will be covered.

## 2019-10-13 ENCOUNTER — Inpatient Hospital Stay (HOSPITAL_BASED_OUTPATIENT_CLINIC_OR_DEPARTMENT_OTHER): Payer: Medicare Other | Admitting: Nurse Practitioner

## 2019-10-13 ENCOUNTER — Inpatient Hospital Stay: Payer: Medicare Other

## 2019-10-13 ENCOUNTER — Encounter: Payer: Self-pay | Admitting: Nurse Practitioner

## 2019-10-13 ENCOUNTER — Other Ambulatory Visit: Payer: Self-pay

## 2019-10-13 VITALS — BP 144/80 | HR 83 | Temp 97.6°F | Wt 178.6 lb

## 2019-10-13 DIAGNOSIS — M791 Myalgia, unspecified site: Secondary | ICD-10-CM

## 2019-10-13 DIAGNOSIS — F418 Other specified anxiety disorders: Secondary | ICD-10-CM

## 2019-10-13 DIAGNOSIS — E871 Hypo-osmolality and hyponatremia: Secondary | ICD-10-CM | POA: Diagnosis not present

## 2019-10-13 DIAGNOSIS — E876 Hypokalemia: Secondary | ICD-10-CM

## 2019-10-13 DIAGNOSIS — K521 Toxic gastroenteritis and colitis: Secondary | ICD-10-CM

## 2019-10-13 DIAGNOSIS — I951 Orthostatic hypotension: Secondary | ICD-10-CM

## 2019-10-13 DIAGNOSIS — G479 Sleep disorder, unspecified: Secondary | ICD-10-CM | POA: Diagnosis not present

## 2019-10-13 DIAGNOSIS — M898X9 Other specified disorders of bone, unspecified site: Secondary | ICD-10-CM

## 2019-10-13 DIAGNOSIS — C561 Malignant neoplasm of right ovary: Secondary | ICD-10-CM

## 2019-10-13 DIAGNOSIS — D701 Agranulocytosis secondary to cancer chemotherapy: Secondary | ICD-10-CM | POA: Diagnosis not present

## 2019-10-13 DIAGNOSIS — C801 Malignant (primary) neoplasm, unspecified: Secondary | ICD-10-CM

## 2019-10-13 DIAGNOSIS — Z5189 Encounter for other specified aftercare: Secondary | ICD-10-CM | POA: Diagnosis not present

## 2019-10-13 DIAGNOSIS — Z5111 Encounter for antineoplastic chemotherapy: Secondary | ICD-10-CM | POA: Diagnosis not present

## 2019-10-13 DIAGNOSIS — F411 Generalized anxiety disorder: Secondary | ICD-10-CM

## 2019-10-13 DIAGNOSIS — T451X5A Adverse effect of antineoplastic and immunosuppressive drugs, initial encounter: Secondary | ICD-10-CM

## 2019-10-13 DIAGNOSIS — D509 Iron deficiency anemia, unspecified: Secondary | ICD-10-CM | POA: Diagnosis not present

## 2019-10-13 DIAGNOSIS — Z95828 Presence of other vascular implants and grafts: Secondary | ICD-10-CM

## 2019-10-13 DIAGNOSIS — G2581 Restless legs syndrome: Secondary | ICD-10-CM | POA: Diagnosis not present

## 2019-10-13 DIAGNOSIS — E538 Deficiency of other specified B group vitamins: Secondary | ICD-10-CM | POA: Diagnosis not present

## 2019-10-13 DIAGNOSIS — R197 Diarrhea, unspecified: Secondary | ICD-10-CM

## 2019-10-13 LAB — CBC WITH DIFFERENTIAL/PLATELET
Abs Immature Granulocytes: 0.18 10*3/uL — ABNORMAL HIGH (ref 0.00–0.07)
Basophils Absolute: 0.1 10*3/uL (ref 0.0–0.1)
Basophils Relative: 1 %
Eosinophils Absolute: 0.5 10*3/uL (ref 0.0–0.5)
Eosinophils Relative: 4 %
HCT: 34.2 % — ABNORMAL LOW (ref 36.0–46.0)
Hemoglobin: 11.8 g/dL — ABNORMAL LOW (ref 12.0–15.0)
Immature Granulocytes: 1 %
Lymphocytes Relative: 20 %
Lymphs Abs: 2.6 10*3/uL (ref 0.7–4.0)
MCH: 29.1 pg (ref 26.0–34.0)
MCHC: 34.5 g/dL (ref 30.0–36.0)
MCV: 84.4 fL (ref 80.0–100.0)
Monocytes Absolute: 0.7 10*3/uL (ref 0.1–1.0)
Monocytes Relative: 6 %
Neutro Abs: 9 10*3/uL — ABNORMAL HIGH (ref 1.7–7.7)
Neutrophils Relative %: 68 %
Platelets: 214 10*3/uL (ref 150–400)
RBC: 4.05 MIL/uL (ref 3.87–5.11)
RDW: 15.3 % (ref 11.5–15.5)
WBC Morphology: INCREASED
WBC: 13.1 10*3/uL — ABNORMAL HIGH (ref 4.0–10.5)
nRBC: 0 % (ref 0.0–0.2)

## 2019-10-13 LAB — COMPREHENSIVE METABOLIC PANEL
ALT: 28 U/L (ref 0–44)
AST: 15 U/L (ref 15–41)
Albumin: 3.9 g/dL (ref 3.5–5.0)
Alkaline Phosphatase: 93 U/L (ref 38–126)
Anion gap: 10 (ref 5–15)
BUN: 17 mg/dL (ref 8–23)
CO2: 27 mmol/L (ref 22–32)
Calcium: 10.1 mg/dL (ref 8.9–10.3)
Chloride: 91 mmol/L — ABNORMAL LOW (ref 98–111)
Creatinine, Ser: 0.64 mg/dL (ref 0.44–1.00)
GFR calc Af Amer: >60 mL/min (ref >60)
GFR calc non Af Amer: >60 mL/min (ref >60)
Glucose, Bld: 120 mg/dL — ABNORMAL HIGH (ref 70–99)
Potassium: 3 mmol/L — ABNORMAL LOW (ref 3.5–5.1)
Sodium: 128 mmol/L — ABNORMAL LOW (ref 135–145)
Total Bilirubin: 0.5 mg/dL (ref 0.3–1.2)
Total Protein: 6.2 g/dL — ABNORMAL LOW (ref 6.5–8.1)

## 2019-10-13 LAB — MAGNESIUM: Magnesium: 1.8 mg/dL (ref 1.7–2.4)

## 2019-10-13 MED ORDER — SODIUM CHLORIDE 0.9% FLUSH
10.0000 mL | Freq: Once | INTRAVENOUS | Status: AC
  Administered 2019-10-13: 10 mL via INTRAVENOUS
  Filled 2019-10-13: qty 10

## 2019-10-13 MED ORDER — ROPINIROLE HCL 0.5 MG PO TABS: ORAL_TABLET | ORAL | 0 refills | Status: DC

## 2019-10-13 MED ORDER — AMITRIPTYLINE HCL 25 MG PO TABS: 50.0000 mg | ORAL_TABLET | Freq: Every day | ORAL | 0 refills | Status: DC

## 2019-10-13 MED ORDER — HEPARIN SOD (PORK) LOCK FLUSH 100 UNIT/ML IV SOLN
500.0000 [IU] | Freq: Once | INTRAVENOUS | Status: AC
  Administered 2019-10-13: 500 [IU] via INTRAVENOUS
  Filled 2019-10-13: qty 5

## 2019-10-13 MED ORDER — SODIUM CHLORIDE 0.9 % IV SOLN
Freq: Once | INTRAVENOUS | Status: AC
  Filled 2019-10-13: qty 250

## 2019-10-13 MED ORDER — SLOW-MAG 71.5-119 MG PO TBEC: 1.0000 | DELAYED_RELEASE_TABLET | Freq: Every day | ORAL | 0 refills | Status: DC

## 2019-10-13 MED ORDER — POTASSIUM CHLORIDE 20 MEQ/100ML IV SOLN
20.0000 meq | Freq: Once | INTRAVENOUS | Status: AC
  Administered 2019-10-13: 20 meq via INTRAVENOUS

## 2019-10-13 MED ORDER — POTASSIUM CHLORIDE CRYS ER 10 MEQ PO TBCR: 10.0000 meq | EXTENDED_RELEASE_TABLET | Freq: Two times a day (BID) | ORAL | 0 refills | Status: DC

## 2019-10-13 NOTE — Progress Notes (Signed)
Symptom Management [place]  [phone] [phone]  Patient Care Team: Juline Patch, MD as PCP - General (Family Medicine) Colvin Caroli, MD as Consulting Physician (Family Medicine) Lin Landsman, MD as Consulting Physician (Gastroenterology) Clent Jacks, RN as Oncology Nurse Navigator   Name of the patient: Carrie Roberson  [id number]  [birth year]   Date of visit: 10/13/19  Diagnosis-high-grade serous ovarian cancer stage IC2   Chief complaint/ Reason for visit-diarrhea, aching legs, muscle weakness  Heme/Onc history:  Oncology History  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[place])  08/19/2019 Initial Diagnosis   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[place])   09/07/2019 Cancer Staging   Staging form: Ovary, Fallopian Tube, and Primary Peritoneal Carcinoma, AJCC 8th Edition - Pathologic stage from 09/07/2019: FIGO Stage IC2, calculated as Stage IC (pT1c2, pN0, cM0) - Signed by Sindy Guadeloupe, MD on 09/07/2019   09/15/2019 -  Chemotherapy   The patient had palonosetron (ALOXI) injection 0.25 mg, 0.25 mg, Intravenous,  Once, 2 of 6 cycles Administration: 0.25 mg (09/15/2019), 0.25 mg (10/06/2019) pegfilgrastim (NEULASTA ONPRO KIT) injection 6 mg, 6 mg, Subcutaneous, Once, 2 of 6 cycles Administration: 6 mg (09/15/2019), 6 mg (10/06/2019) CARBOplatin (PARAPLATIN) 350 mg in sodium chloride 0.9 % 250 mL chemo infusion, 350 mg (100 % of original dose 354.8 mg), Intravenous,  Once, 2 of 6 cycles Dose modification:   (original dose 354.8 mg, Cycle 1) Administration: 350 mg (09/15/2019), 350 mg (10/06/2019) fosaprepitant (EMEND) 150 mg in sodium chloride 0.9 % 145 mL IVPB, 150 mg, Intravenous,  Once, 2 of 6 cycles Administration: 150 mg (09/15/2019), 150 mg (10/06/2019) PACLitaxel (TAXOL) 336 mg in sodium chloride 0.9 % 500 mL chemo infusion (> 20m/m2), 175 mg/m2 = 336 mg, Intravenous,  Once, 2 of 6 cycles Administration: 336 mg  (09/15/2019), 336 mg (10/06/2019)  for chemotherapy treatment.      Interval history-Carrie Roberson 74 year old female, diagnosed with stage I C2 high-grade serous ovarian cancer s/p TAH-BSO and 2 cycles of adjuvant carbo-taxol chemotherapy, last on 10/06/19, presents to symptom management clinic for complaints of diarrhea, restless leg, and myalgias.  She had similar symptoms after cycle 1 which gradually improved prior to cycle 2 and then have recurred since that time.  She has a history of IBS and has been taking MiraLAX to help soften her bowel movements.  Describes multiple bowel movements earlier this week.  Did not take OTC medicines.  Has held MMead  Has been unable to swallow potassium tablets.  Reports poor sleep primarily due to restless leg.  Previously received iron infusion and was started on B12 by medical oncology.  Was also started on Requip but she has not noticed a difference.  Also complains of deep muscle and bone pain which she rates a 3 of 10 currently but can become more severe at times unrelieved by Tylenol.  Does not like to take narcotic pain medicines due to side effects.  His felt like her muscles were weak and her legs give out.  Ordered a walker for home use.  Denies falls.  Has been taking oral magnesium but requests refill.  Says she eats and drinks very little due to generalized malaise.  Denies fevers and chills.  Reports weight loss.  No chest pain or shortness of breath.  No nausea or vomiting.  Denies urinary complaints.  ECOG FS:2 - Symptomatic, <50% confined to bed  Review of systems- Review of Systems  Constitutional: Positive for malaise/fatigue and weight  loss. Negative for chills and fever.  HENT: Negative for hearing loss, nosebleeds, sore throat and tinnitus.   Eyes: Negative for blurred vision and double vision.  Respiratory: Negative for cough, hemoptysis, shortness of breath and wheezing.   Cardiovascular: Negative for chest pain, palpitations and leg  swelling.  Gastrointestinal: Positive for constipation and diarrhea. Negative for abdominal pain, blood in stool, melena, nausea and vomiting.  Genitourinary: Negative for dysuria, frequency and urgency.  Musculoskeletal: Positive for myalgias. Negative for back pain, falls and joint pain.  Skin: Negative for itching and rash.  Neurological: Positive for weakness. Negative for dizziness, tingling, sensory change, loss of consciousness and headaches.  Endo/Heme/Allergies: Negative for environmental allergies. Does not bruise/bleed easily.  Psychiatric/Behavioral: Positive for depression. The patient is nervous/anxious and has insomnia.      Current treatment-adjuvant carbo-Taxol chemotherapy.  Cycle 2 on 10/06/2019  Allergies  Allergen Reactions  . Citalopram Nausea And Vomiting  . Ivp Dye [Iodinated Diagnostic Agents] Hives and Swelling    hives  . Meloxicam     unknown  . Adhesive [Tape] Rash  . Amlodipine Rash  . Ampicillin Rash    Did it involve swelling of the face/tongue/throat, SOB, or low BP? No Did it involve sudden or severe rash/hives, skin peeling, or any reaction on the inside of your mouth or nose? Yes Did you need to seek medical attention at a hospital or doctor's office? Yes When did it last happen? 10 + years If all above answers are "NO", may proceed with cephalosporin use.  . Cefoxitin Rash  . Estradiol Rash  . Estropipate Rash  . Olmesartan Medoxomil-Hctz Rash  . Sertraline Rash       . Sulfa Antibiotics Rash    Past Medical History:  Diagnosis Date  . Anxiety   . Arthritis   . Family history of breast cancer   . Family history of leukemia   . GERD (gastroesophageal reflux disease)   . History of ischemic colitis 09/02/2017  . Hx of colonic polyp   . Hypertension    H/O-PT TAKEN OFF BY PCP AS OF 2019  . IBS (irritable bowel syndrome)   . Ovarian cancer ([place])   . Ovarian cyst   . Vaginal atrophy     Past Surgical History:  Procedure  Laterality Date  . BREAST BIOPSY Left    needle bx/clip-neg  . CERVICAL BIOPSY  W/ LOOP ELECTRODE EXCISION    . CHOLECYSTECTOMY    . Colonoscopoy  2007, 2008, 2011  . COLONOSCOPY WITH PROPOFOL N/A 05/05/2018   Procedure: COLONOSCOPY WITH PROPOFOL;  Surgeon: Lin Landsman, MD;  Location: [facility] ENDOSCOPY;  Service: Gastroenterology;  Laterality: N/A;  . HYSTERECTOMY ABDOMINAL WITH SALPINGO-OOPHORECTOMY N/A 08/19/2019   Procedure: HYSTERECTOMY ABDOMINAL WITH SALPINGO-OOPHORECTOMY WITH PELVIC WASHINGS PERITONEAL BIOPSIES AND PARA AORTIC LYMPH NODE DISSECTION;  Surgeon: [name], MD;  Location: [facility] ORS;  Service: Gynecology;  Laterality: N/A;  . LEEP    . OMENTECTOMY N/A 08/19/2019   Procedure: OMENTECTOMY;  Surgeon: [name], MD;  Location: [facility] ORS;  Service: Gynecology;  Laterality: N/A;  . PORTA CATH INSERTION N/A 09/11/2019   Procedure: PORTA CATH INSERTION;  Surgeon: Algernon Huxley, MD;  Location: [facility];  Service: Cardiovascular;  Laterality: N/A;  . TUBAL LIGATION  1999    Social History   Socioeconomic History  . Marital status: Single    Spouse name: Not on file  . Number of children: 0  . Years of education: Not on file  .  Highest education level: Associate degree: academic program  Occupational History  . Occupation: Retired  Tobacco Use  . Smoking status: Never Smoker  . Smokeless tobacco: Never Used  . Tobacco comment: smoking cessation materials not required  Substance and Sexual Activity  . Alcohol use: Yes    Comment: rarely  . Drug use: No  . Sexual activity: Never    Birth control/protection: Post-menopausal  Other Topics Concern  . Not on file  Social History Narrative  . Not on file   Social Determinants of Health   Financial Resource Strain:   . Difficulty of Paying Living Expenses:   Food Insecurity:   . Worried About Charity fundraiser in the Last Year:   . Arboriculturist in the Last Year:   Transportation Needs:    . Film/video editor (Medical):   Marland Kitchen Lack of Transportation (Non-Medical):   Physical Activity:   . Days of Exercise per Week:   . Minutes of Exercise per Session:   Stress:   . Feeling of Stress :   Social Connections:   . Frequency of Communication with Friends and Family:   . Frequency of Social Gatherings with Friends and Family:   . Attends Religious Services:   . Active Member of Clubs or Organizations:   . Attends Archivist Meetings:   Marland Kitchen Marital Status:   Intimate Partner Violence:   . Fear of Current or Ex-Partner:   . Emotionally Abused:   Marland Kitchen Physically Abused:   . Sexually Abused:     Family History  Problem Relation Age of Onset  . Heart failure Mother   . Atrial fibrillation Mother   . Hypertension Mother   . Diabetes Father   . Heart attack Father   . Breast cancer Maternal Aunt 70  . Breast cancer Maternal Grandmother 80  . Breast cancer Cousin        2 mat cousins  . Hypertension Sister   . Hypertension Sister   . Arthritis Sister   . Leukemia Paternal Aunt   . Leukemia Paternal Uncle   . Brain cancer Paternal Aunt   . Other Brother 13       Drowning accident  . Liver cancer Maternal Uncle   . Alcohol abuse Maternal Uncle   . Prostate cancer Neg Hx   . Kidney cancer Neg Hx      Current Outpatient Medications:  .  acetaminophen (TYLENOL) 500 MG tablet, Take 1,000 mg by mouth every 6 (six) hours as needed (for pain.)., Disp: , Rfl:  .  amitriptyline (ELAVIL) 25 MG tablet, TAKE 1 TABLET BY MOUTH EVERYDAY AT BEDTIME, Disp: 90 tablet, Rfl: 0 .  dexamethasone (DECADRON) 4 MG tablet, Take 2 tablets (8 mg total) by mouth daily. Start the day after carboplatin chemotherapy for 3 days., Disp: 30 tablet, Rfl: 1 .  hydrochlorothiazide (HYDRODIURIL) 25 MG tablet, TAKE 1 TABLET BY MOUTH EVERY DAY, Disp: 30 tablet, Rfl: 0 .  lidocaine-prilocaine (EMLA) cream, Apply to affected area once (Patient taking differently: Apply 1 application topically daily  as needed (prior to port being accessed.). ), Disp: 30 g, Rfl: 3 .  LORazepam (ATIVAN) 0.5 MG tablet, Take 1 tablet (0.5 mg total) by mouth every 6 (six) hours as needed (Nausea/vomiting or anxiety)., Disp: 90 tablet, Rfl: 0 .  ondansetron (ZOFRAN) 8 MG tablet, Take 1 tablet (8 mg total) by mouth 2 (two) times daily as needed for refractory nausea / vomiting. Start on day 3  after carboplatin chemo., Disp: 30 tablet, Rfl: 1 .  prochlorperazine (COMPAZINE) 10 MG tablet, Take 1 tablet (10 mg total) by mouth every 6 (six) hours as needed (Nausea or vomiting)., Disp: 30 tablet, Rfl: 1 .  rOPINIRole (REQUIP) 0.25 MG tablet, Take 1 tablet (0.25 mg total) by mouth at bedtime., Disp: 7 tablet, Rfl: 0 .  benzocaine-resorcinol (VAGISIL) 5-2 % vaginal cream, Place 1 application vaginally daily as needed for itching or irritation., Disp: , Rfl:  .  cetirizine (ZYRTEC) 10 MG tablet, Take 10 mg by mouth daily as needed (allergies.). , Disp: , Rfl:  .  diphenhydrAMINE (BENADRYL) 25 MG tablet, Take 25 mg by mouth daily as needed for itching or allergies., Disp: , Rfl:  .  fluticasone (FLONASE) 50 MCG/ACT nasal spray, Place 2 sprays into both nostrils daily. (Patient not taking: Reported on 10/13/2019), Disp: 16 g, Rfl: 0 .  guaiFENesin (MUCINEX) 600 MG 12 hr tablet, Take 600 mg by mouth at bedtime as needed (congestion)., Disp: , Rfl:  .  hydrocortisone cream 1 %, Apply 1 application topically daily as needed for itching., Disp: , Rfl:  .  omeprazole (PRILOSEC) 20 MG capsule, Take 20 mg by mouth daily as needed (acid reflux)., Disp: , Rfl:  .  oxyCODONE-acetaminophen (PERCOCET/ROXICET) 5-325 MG tablet, Take 1-2 tablets by mouth every 4 (four) hours as needed for moderate pain. (Patient not taking: Reported on 09/23/2019), Disp: 40 tablet, Rfl: 0 .  polyethylene glycol (MIRALAX / GLYCOLAX) 17 g packet, Take 17 g by mouth 2 (two) times daily., Disp: , Rfl:  .  potassium chloride SA (KLOR-CON) 20 MEQ tablet, Take 1 tablet  (20 mEq total) by mouth daily. (Patient not taking: Reported on 10/13/2019), Disp: 14 tablet, Rfl: 0 .  psyllium (METAMUCIL) 0.52 g capsule, Take 0.52 g by mouth in the morning and at bedtime., Disp: , Rfl:  .  senna (SENOKOT) 8.6 MG tablet, Take 1 tablet by mouth daily as needed. , Disp: , Rfl:  .  traMADol (ULTRAM) 50 MG tablet, Take 1 tablet (50 mg total) by mouth every 6 (six) hours as needed. (Patient not taking: Reported on 09/23/2019), Disp: 60 tablet, Rfl: 0  Physical exam:  Vitals:   10/13/19 0922  BP: (!) 144/80  Pulse: 83  Temp: 97.6 F (36.4 C)  TempSrc: Tympanic  Weight: 178 lb 9.6 oz (81 kg)   Physical Exam   CMP Latest Ref Rng & Units 10/13/2019  Glucose 70 - 99 mg/dL 120(H)  BUN 8 - 23 mg/dL 17  Creatinine 0.44 - 1.00 mg/dL 0.64  Sodium 135 - 145 mmol/L 128(L)  Potassium 3.5 - 5.1 mmol/L 3.0(L)  Chloride 98 - 111 mmol/L 91(L)  CO2 22 - 32 mmol/L 27  Calcium 8.9 - 10.3 mg/dL 10.1  Total Protein 6.5 - 8.1 g/dL 6.2(L)  Total Bilirubin 0.3 - 1.2 mg/dL 0.5  Alkaline Phos 38 - 126 U/L 93  AST 15 - 41 U/L 15  ALT 0 - 44 U/L 28   CBC Latest Ref Rng & Units 10/13/2019  WBC 4.0 - 10.5 K/uL 13.1(H)  Hemoglobin 12.0 - 15.0 g/dL 11.8(L)  Hematocrit 36.0 - 46.0 % 34.2(L)  Platelets 150 - 400 K/uL 214    No images are attached to the encounter.  CT ABDOMEN WO CONTRAST  Result Date: 09/29/2019 CLINICAL DATA:  Ovarian cancer restaging. Lower abdominal pain. EXAM: CT CHEST AND ABDOMEN WITHOUT CONTRAST TECHNIQUE: Multidetector CT imaging of the chest and abdomen was performed following the standard  protocol without IV contrast. COMPARISON:  None. FINDINGS: CT CHEST FINDINGS Cardiovascular: Port-A-Cath tip: Lower SVC. Atherosclerotic calcification of the thoracic aorta and branch vessels. Mediastinum/Nodes: Contrast medium in the distal esophagus suggesting reflux or dysmotility. Oval-shaped structure posterior to the upper thoracic esophagus, probably a lymph node, 0.7 cm in short  axis on image 8/2. Nodularity along the inferior margin of the thyroid isthmus, contiguous with the thyroid gland, measuring 2.1 by 1.9 by 1.9 cm. Anterior mediastinal nodule with roughly similar density to the thyroid gland but separate from the thyroid, measuring 2.5 by 1.5 by 3.4 cm, with punctate calcifications posteriorly. Right hilar node 1.0 cm in short axis on image 73/4. Lungs/Pleura: Biapical pleuroparenchymal scarring. 0.4 by 0.2 cm nodule in the left lower lobe on image 88/3 mild airway plugging in the left upper lobe on image 43/3. 2 mm subpleural nodule along the left major fissure inferiorly on image 124/3. Linear subsegmental atelectasis or scarring peripherally in the right upper lobe on image 51/3. Musculoskeletal: Incidental Schmorl's nodes in the midthoracic spine. CT ABDOMEN PELVIS FINDINGS Hepatobiliary: Cholecystectomy. Otherwise unremarkable. Pancreas: Unremarkable Spleen: Unremarkable Adrenals/Urinary Tract: Unremarkable Stomach/Bowel: Unremarkable Vascular/Lymphatic: Aortoiliac atherosclerotic vascular disease. No pathologic adenopathy in the upper abdomen. Other: Prior laparotomy with some faint stranding along the anterior omentum in the immediate vicinity of the laparotomy site. Musculoskeletal: Multilevel lumbar degenerative disc disease. IMPRESSION: 1. Suspected thyroid nodule along the inferior isthmus margin of the thyroid gland along with a separate anterior mediastinal fairly high density mass with punctate calcifications. 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 Thyroid scintigraphy should be considered to further characterize these lesions; if negative on scintigraphy then further workup to exclude the possibility of lymphoma or thymic tumor would be suggested. 2. Contrast medium in the distal esophagus suggesting reflux or dysmotility. 3. Several tiny pulmonary nodules are  present, 3 mm in average diameter or less. No follow-up needed if patient is low-risk (and has no known or suspected primary neoplasm). Non-contrast chest CT can be considered in 12 months if patient is high-risk. This recommendation follows the consensus statement: Guidelines for Management of Incidental Pulmonary Nodules Detected on CT Images: From the Fleischner Society 2017; Radiology 2017; 284:228-243. 4. Aortoiliac atherosclerotic vascular disease. 5. Multilevel lumbar degenerative disc disease. 6. Prior laparotomy with some faint stranding along the anterior omentum in the immediate vicinity of the laparotomy site, likely from benign scarring. Aortic Atherosclerosis (ICD10-I70.0). Electronically Signed   By: Van Clines M.D.   On: 09/29/2019 13:08   CT CHEST WO CONTRAST  Result Date: 09/29/2019 CLINICAL DATA:  Ovarian cancer restaging. Lower abdominal pain. EXAM: CT CHEST AND ABDOMEN WITHOUT CONTRAST TECHNIQUE: Multidetector CT imaging of the chest and abdomen was performed following the standard protocol without IV contrast. COMPARISON:  None. FINDINGS: CT CHEST FINDINGS Cardiovascular: Port-A-Cath tip: Lower SVC. Atherosclerotic calcification of the thoracic aorta and branch vessels. Mediastinum/Nodes: Contrast medium in the distal esophagus suggesting reflux or dysmotility. Oval-shaped structure posterior to the upper thoracic esophagus, probably a lymph node, 0.7 cm in short axis on image 8/2. Nodularity along the inferior margin of the thyroid isthmus, contiguous with the thyroid gland, measuring 2.1 by 1.9 by 1.9 cm. Anterior mediastinal nodule with roughly similar density to the thyroid gland but separate from the thyroid, measuring 2.5 by 1.5 by 3.4 cm, with punctate calcifications posteriorly. Right hilar node 1.0 cm in short axis on image 73/4. Lungs/Pleura: Biapical pleuroparenchymal scarring. 0.4 by  0.2 cm nodule in the left lower lobe on image 88/3 mild airway plugging in the left upper  lobe on image 43/3. 2 mm subpleural nodule along the left major fissure inferiorly on image 124/3. Linear subsegmental atelectasis or scarring peripherally in the right upper lobe on image 51/3. Musculoskeletal: Incidental Schmorl's nodes in the midthoracic spine. CT ABDOMEN PELVIS FINDINGS Hepatobiliary: Cholecystectomy. Otherwise unremarkable. Pancreas: Unremarkable Spleen: Unremarkable Adrenals/Urinary Tract: Unremarkable Stomach/Bowel: Unremarkable Vascular/Lymphatic: Aortoiliac atherosclerotic vascular disease. No pathologic adenopathy in the upper abdomen. Other: Prior laparotomy with some faint stranding along the anterior omentum in the immediate vicinity of the laparotomy site. Musculoskeletal: Multilevel lumbar degenerative disc disease. IMPRESSION: 1. Suspected thyroid nodule along the inferior isthmus margin of the thyroid gland along with a separate anterior mediastinal fairly high density mass with punctate calcifications. 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 Thyroid scintigraphy should be considered to further characterize these lesions; if negative on scintigraphy then further workup to exclude the possibility of lymphoma or thymic tumor would be suggested. 2. Contrast medium in the distal esophagus suggesting reflux or dysmotility. 3. Several tiny pulmonary nodules are present, 3 mm in average diameter or less. No follow-up needed if patient is low-risk (and has no known or suspected primary neoplasm). Non-contrast chest CT can be considered in 12 months if patient is high-risk. This recommendation follows the consensus statement: Guidelines for Management of Incidental Pulmonary Nodules Detected on CT Images: From the Fleischner Society 2017; Radiology 2017; 284:228-243. 4. Aortoiliac atherosclerotic vascular disease. 5. Multilevel lumbar degenerative disc disease. 6. Prior laparotomy  with some faint stranding along the anterior omentum in the immediate vicinity of the laparotomy site, likely from benign scarring. Aortic Atherosclerosis (ICD10-I70.0). Electronically Signed   By: Van Clines M.D.   On: 09/29/2019 13:08    Assessment and plan- Patient is a 74 y.o. female diagnosed with high grade serous ovarian cancer currently s/p cycle 2 of adjuvant carbo-taxol chemotherapy with neulasta support who presents to [facility]om Management Clinic for multiple concerns.   1. Diarrhea- hx of IBS. On fiber supplement and has been taking osmotic laxatives. Likely secondary to chemotherapy. Advised patient to continue fiber. Will increase amitriptyline to IBS dosing she previously took of 50 mg at bedtime per Dr. Marius Ditch. Hold miralax unless no BM in 24 hours then take 1 dose once to twice a day as needed to have BM. Ok to take imodium if > 3 stools in 24 hour period. May also consider stool studies/IBS labs to evaluate in the future.   2. Orthostasis- likely secondary to fluid volume deficit. Change positions slowly. Patient has walker at home. Will give fluids today in clinic. Encouraged oral hydration.   3. Hyponatremia- Na 128 today. Likely secondary to fluid deficit. 1 L NaCl in clinic today. Will plan to repeat labs later this week to re-evaluate.   4. Hypokalemia- K 3.0 today. Plan for 20 meq potassium chloride IV in clinic today. Unable to swallow 20 meq tablets. Will change to 10 meq tablets, ok to split in half, and adjust dosing to BID for total of 20 meq daily. Can consider liquid formulation if not tolerated.   5. Insomnia/Impaired sleep- likely secondary to myalgias, restless leg, and anxiety. Continue ativan at bedtime as needed for anxiety. See below regarding myalgias and RLS.   6. Iron Deficiency Anemia- scheduled for additional dose of feraheme in April per dr. Janese Banks along with b12 for b12  deficiency. Hmg slightly decreased but stable today.   7. Restless Leg- Persistent  symptoms despite feraheme. Increase requip to 0.5 mg at bedtime and if tolerating well but persistent symptoms, plan to increase to 55m at bedtime after 7 days. Can consider uptitrations but would want to re-evaluate symptoms.    8. Depression/Anxiety- on amitriptyline per pcp. Dose increased to 50 mg at bedtime with ativan for breakthrough anxiety.   9. Myalgias & Arthralgias- likely secondary to RLS and GCSF administration. Discussed mechanism of action of medication and rationale for claritin. Continue tylenol for pain. Side effects to percocet and tramadol- ok to hold.   10. Leukocytosis- wbc 13.1, ANC 9.0. Likely secondary to neulasta administration. Afebrile. Continue to monitor.   11. Weight loss- secondary to poor appetite and dehydration. Will send ref to JJennet Maduro RD for monitoring of weights and trends.   12. Hypomagnesemia- patient reports magnesium supplementation chronically. 1.8 today. Continue otc magnesium supplementation. Encouraged slow release formulation in setting of diarrhea.   Disposition:  RTC on 3/26 for labs, re-evaluation, and possible fluids. RTC sooner if symptoms do not improve or worsen in the interim.   Visit Diagnosis 1. Chemotherapy induced diarrhea   2. Orthostasis   3. Hyponatremia   4. Hypokalemia   5. Sleep difficulties   6. Restless leg   7. Anxiety associated with cancer diagnosis   8. 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   9. Myalgia    Patient expressed understanding and was in agreement with this plan. She also understands that She can call clinic at any time with any questions, concerns, or complaints.   Thank you for allowing me to participate in the care of this very pleasant patient.   A total of (50) minutes of face-to-face time was spent with this patient with greater than 50% of that time in counseling and care-coordination.  LBeckey Rutter DNP, AGNP-C CRam[facility]rez[facility] CC: Dr. RJanese Banks

## 2019-10-14 ENCOUNTER — Telehealth: Payer: Medicare Other | Admitting: Genetic Counselor

## 2019-10-14 ENCOUNTER — Telehealth: Payer: Self-pay | Admitting: Nurse Practitioner

## 2019-10-14 NOTE — Telephone Encounter (Signed)
Spoke to patient to f/u regarding her symptoms and treatment yesterday. She is feeling much better and slept better last night. We again reviewed medication changes. She'll call clinic if symptoms worsen in interim.

## 2019-10-16 ENCOUNTER — Encounter: Payer: Self-pay | Admitting: Nurse Practitioner

## 2019-10-16 ENCOUNTER — Inpatient Hospital Stay: Payer: Medicare Other

## 2019-10-16 ENCOUNTER — Inpatient Hospital Stay (HOSPITAL_BASED_OUTPATIENT_CLINIC_OR_DEPARTMENT_OTHER): Payer: Medicare Other | Admitting: Nurse Practitioner

## 2019-10-16 VITALS — BP 139/68 | HR 81 | Temp 97.9°F | Resp 20

## 2019-10-16 DIAGNOSIS — E876 Hypokalemia: Secondary | ICD-10-CM | POA: Diagnosis not present

## 2019-10-16 DIAGNOSIS — T451X5A Adverse effect of antineoplastic and immunosuppressive drugs, initial encounter: Secondary | ICD-10-CM | POA: Diagnosis not present

## 2019-10-16 DIAGNOSIS — E871 Hypo-osmolality and hyponatremia: Secondary | ICD-10-CM

## 2019-10-16 DIAGNOSIS — Z95828 Presence of other vascular implants and grafts: Secondary | ICD-10-CM

## 2019-10-16 DIAGNOSIS — Z5111 Encounter for antineoplastic chemotherapy: Secondary | ICD-10-CM | POA: Diagnosis not present

## 2019-10-16 DIAGNOSIS — D509 Iron deficiency anemia, unspecified: Secondary | ICD-10-CM | POA: Diagnosis not present

## 2019-10-16 DIAGNOSIS — I951 Orthostatic hypotension: Secondary | ICD-10-CM

## 2019-10-16 DIAGNOSIS — D701 Agranulocytosis secondary to cancer chemotherapy: Secondary | ICD-10-CM | POA: Diagnosis not present

## 2019-10-16 DIAGNOSIS — G2581 Restless legs syndrome: Secondary | ICD-10-CM

## 2019-10-16 DIAGNOSIS — C561 Malignant neoplasm of right ovary: Secondary | ICD-10-CM

## 2019-10-16 DIAGNOSIS — R634 Abnormal weight loss: Secondary | ICD-10-CM

## 2019-10-16 DIAGNOSIS — K521 Toxic gastroenteritis and colitis: Secondary | ICD-10-CM

## 2019-10-16 DIAGNOSIS — Z5189 Encounter for other specified aftercare: Secondary | ICD-10-CM | POA: Diagnosis not present

## 2019-10-16 DIAGNOSIS — E538 Deficiency of other specified B group vitamins: Secondary | ICD-10-CM | POA: Diagnosis not present

## 2019-10-16 LAB — COMPREHENSIVE METABOLIC PANEL
ALT: 21 U/L (ref 0–44)
AST: 17 U/L (ref 15–41)
Albumin: 3.7 g/dL (ref 3.5–5.0)
Alkaline Phosphatase: 91 U/L (ref 38–126)
Anion gap: 11 (ref 5–15)
BUN: 17 mg/dL (ref 8–23)
CO2: 24 mmol/L (ref 22–32)
Calcium: 9.9 mg/dL (ref 8.9–10.3)
Chloride: 94 mmol/L — ABNORMAL LOW (ref 98–111)
Creatinine, Ser: 0.88 mg/dL (ref 0.44–1.00)
GFR calc Af Amer: >60 mL/min (ref >60)
GFR calc non Af Amer: >60 mL/min (ref >60)
Glucose, Bld: 115 mg/dL — ABNORMAL HIGH (ref 70–99)
Potassium: 3.2 mmol/L — ABNORMAL LOW (ref 3.5–5.1)
Sodium: 129 mmol/L — ABNORMAL LOW (ref 135–145)
Total Bilirubin: 0.3 mg/dL (ref 0.3–1.2)
Total Protein: 6.1 g/dL — ABNORMAL LOW (ref 6.5–8.1)

## 2019-10-16 LAB — CBC WITH DIFFERENTIAL/PLATELET
Abs Immature Granulocytes: 0.75 10*3/uL — ABNORMAL HIGH (ref 0.00–0.07)
Basophils Absolute: 0.1 10*3/uL (ref 0.0–0.1)
Basophils Relative: 1 %
Eosinophils Absolute: 0.1 10*3/uL (ref 0.0–0.5)
Eosinophils Relative: 1 %
HCT: 35.1 % — ABNORMAL LOW (ref 36.0–46.0)
Hemoglobin: 12 g/dL (ref 12.0–15.0)
Immature Granulocytes: 7 %
Lymphocytes Relative: 21 %
Lymphs Abs: 2.2 10*3/uL (ref 0.7–4.0)
MCH: 29.2 pg (ref 26.0–34.0)
MCHC: 34.2 g/dL (ref 30.0–36.0)
MCV: 85.4 fL (ref 80.0–100.0)
Monocytes Absolute: 1.2 10*3/uL — ABNORMAL HIGH (ref 0.1–1.0)
Monocytes Relative: 12 %
Neutro Abs: 6.2 10*3/uL (ref 1.7–7.7)
Neutrophils Relative %: 58 %
Platelets: 256 10*3/uL (ref 150–400)
RBC: 4.11 MIL/uL (ref 3.87–5.11)
RDW: 16.3 % — ABNORMAL HIGH (ref 11.5–15.5)
WBC: 10.5 10*3/uL (ref 4.0–10.5)
nRBC: 0.3 % — ABNORMAL HIGH (ref 0.0–0.2)

## 2019-10-16 MED ORDER — SODIUM CHLORIDE 0.9 % IV SOLN
Freq: Once | INTRAVENOUS | Status: AC
  Filled 2019-10-16: qty 250

## 2019-10-16 MED ORDER — HEPARIN SOD (PORK) LOCK FLUSH 100 UNIT/ML IV SOLN
500.0000 [IU] | Freq: Once | INTRAVENOUS | Status: AC
  Administered 2019-10-16: 500 [IU] via INTRAVENOUS
  Filled 2019-10-16: qty 5

## 2019-10-16 MED ORDER — SODIUM CHLORIDE 0.9% FLUSH
10.0000 mL | Freq: Once | INTRAVENOUS | Status: AC
  Administered 2019-10-16: 10 mL via INTRAVENOUS
  Filled 2019-10-16: qty 10

## 2019-10-16 MED ORDER — POTASSIUM CHLORIDE 20 MEQ/100ML IV SOLN
20.0000 meq | Freq: Once | INTRAVENOUS | Status: AC
  Administered 2019-10-16: 20 meq via INTRAVENOUS

## 2019-10-16 NOTE — Progress Notes (Signed)
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[place])  08/19/2019 Initial Diagnosis   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[facility])   09/07/2019 Cancer Staging   Staging form: Ovary, Fallopian Tube, and Primary Peritoneal Carcinoma, AJCC 8th Edition - Pathologic stage from 09/07/2019: FIGO Stage IC2, calculated as Stage IC (pT1c2, pN0, cM0) - Signed by Sindy Guadeloupe, MD on 09/07/2019   09/15/2019 -  Chemotherapy   The patient had palonosetron (ALOXI) injection 0.25 mg, 0.25 mg, Intravenous,  Once, 2 of 6 cycles Administration: 0.25 mg (09/15/2019), 0.25 mg (10/06/2019) pegfilgrastim (NEULASTA ONPRO KIT) injection 6 mg, 6 mg, Subcutaneous, Once, 2 of 6 cycles Administration: 6 mg (09/15/2019), 6 mg (10/06/2019) CARBOplatin (PARAPLATIN) 350 mg in sodium chloride 0.9 % 250 mL chemo infusion, 350 mg (100 % of original dose 354.8 mg), Intravenous,  Once, 2 of 6 cycles Dose modification:   (original dose 354.8 mg, Cycle 1) Administration: 350 mg (09/15/2019), 350 mg (10/06/2019) fosaprepitant (EMEND) 150 mg in sodium chloride 0.9 % 145 mL IVPB, 150 mg, Intravenous,  Once, 2 of 6 cycles Administration: 150 mg (09/15/2019), 150 mg (10/06/2019) PACLitaxel (TAXOL) 336 mg in sodium chloride 0.9 % 500 mL chemo infusion (> 58m/m2), 175 mg/m2 = 336 mg, Intravenous,  Once, 2 of 6 cycles Administration: 336 mg (09/15/2019), 336 mg  (10/06/2019)  for chemotherapy treatment.      Interval history- MHolden Roberson 74year old female diagnosed with ovarian cancer, currently receiving adjuvant carbo-Taxol chemotherapy with Neulasta support, presents to symptom management clinic for reevaluation of multiple complaints including diarrhea, dehydration, electrolyte abnormalities, anxiety, poor sleep, anemia, restless leg.  She was seen in symptom management clinic for similar symptoms earlier this week and received IV fluids, IV potassium.  She had previously been able to swallow her potassium tablets.  Rating smaller tablets well with more frequent dosing.  Diarrhea has occurred once but controlled with Imodium.  Appetite reduced but tolerating food and fluids.  Weight downtrending.  Sleep is been somewhat improved.  Restless leg better.  Bone pain and myalgias have improved.  No falls.  Strength of leg seems better.  ECOG FS:1 - Symptomatic but completely ambulatory  Review of systems- Review of Systems  Constitutional: Positive for malaise/fatigue (Improved) and weight loss. Negative for chills and fever.  HENT: Negative for hearing loss, nosebleeds, sore throat and tinnitus.   Eyes: Negative for blurred vision and double vision.  Respiratory: Negative for cough, hemoptysis, shortness of breath and wheezing.   Cardiovascular: Negative for chest pain, palpitations and leg swelling.  Gastrointestinal: Negative for abdominal pain, blood in stool, constipation, diarrhea, melena, nausea and vomiting.  Genitourinary: Negative for dysuria and urgency.  Musculoskeletal: Negative for back pain, falls, joint pain and myalgias.  Skin: Negative for itching and rash.  Neurological: Negative for dizziness, tingling, sensory change, loss of consciousness, weakness and headaches.  Endo/Heme/Allergies: Negative for environmental allergies. Does not bruise/bleed easily.  Psychiatric/Behavioral: Negative for depression. The patient is not  nervous/anxious (improved) and does not have insomnia (  improved).      Current treatment-carbo-Taxol with Neulasta support  Allergies  Allergen Reactions  . Citalopram Nausea And Vomiting  . Ivp Dye [Iodinated Diagnostic Agents] Hives and Swelling    hives  . Meloxicam     unknown  . Adhesive [Tape] Rash  . Amlodipine Rash  . Ampicillin Rash    Did it involve swelling of the face/tongue/throat, SOB, or low BP? No Did it involve sudden or severe rash/hives, skin peeling, or any reaction on the inside of your mouth or nose? Yes Did you need to seek medical attention at a hospital or doctor's office? Yes When did it last happen? 10 + years If all above answers are "NO", may proceed with cephalosporin use.  . Cefoxitin Rash  . Estradiol Rash  . Estropipate Rash  . Olmesartan Medoxomil-Hctz Rash  . Sertraline Rash       . Sulfa Antibiotics Rash    Past Medical History:  Diagnosis Date  . Anxiety   . Arthritis   . Family history of breast cancer   . Family history of leukemia   . GERD (gastroesophageal reflux disease)   . History of ischemic colitis 09/02/2017  . Hx of colonic polyp   . Hypertension    H/O-PT TAKEN OFF BY PCP AS OF 2019  . IBS (irritable bowel syndrome)   . Ovarian cancer ([place])   . Ovarian cyst   . Vaginal atrophy     Past Surgical History:  Procedure Laterality Date  . BREAST BIOPSY Left    needle bx/clip-neg  . CERVICAL BIOPSY  W/ LOOP ELECTRODE EXCISION    . CHOLECYSTECTOMY    . Colonoscopoy  2007, 2008, 2011  . COLONOSCOPY WITH PROPOFOL N/A 05/05/2018   Procedure: COLONOSCOPY WITH PROPOFOL;  Surgeon: Lin Landsman, MD;  Location: [facility] ENDOSCOPY;  Service: Gastroenterology;  Laterality: N/A;  . HYSTERECTOMY ABDOMINAL WITH SALPINGO-OOPHORECTOMY N/A 08/19/2019   Procedure: HYSTERECTOMY ABDOMINAL WITH SALPINGO-OOPHORECTOMY WITH PELVIC WASHINGS PERITONEAL BIOPSIES AND PARA AORTIC LYMPH NODE DISSECTION;  Surgeon: [name], MD;   Location: [facility] ORS;  Service: Gynecology;  Laterality: N/A;  . LEEP    . OMENTECTOMY N/A 08/19/2019   Procedure: OMENTECTOMY;  Surgeon: [name], MD;  Location: [facility] ORS;  Service: Gynecology;  Laterality: N/A;  . PORTA CATH INSERTION N/A 09/11/2019   Procedure: PORTA CATH INSERTION;  Surgeon: Algernon Huxley, MD;  Location: [facility];  Service: Cardiovascular;  Laterality: N/A;  . TUBAL LIGATION  1999    Social History   Socioeconomic History  . Marital status: Single    Spouse name: Not on file  . Number of children: 0  . Years of education: Not on file  . Highest education level: Associate degree: academic program  Occupational History  . Occupation: Retired  Tobacco Use  . Smoking status: Never Smoker  . Smokeless tobacco: Never Used  . Tobacco comment: smoking cessation materials not required  Substance and Sexual Activity  . Alcohol use: Yes    Comment: rarely  . Drug use: No  . Sexual activity: Never    Birth control/protection: Post-menopausal  Other Topics Concern  . Not on file  Social History Narrative  . Not on file   Social Determinants of Health   Financial Resource Strain:   . Difficulty of Paying Living Expenses:   Food Insecurity:   . Worried About Charity fundraiser in the Last Year:   . [place] in the Last Year:  Transportation Needs:   . Film/video editor (Medical):   Marland Kitchen Lack of Transportation (Non-Medical):   Physical Activity:   . Days of Exercise per Week:   . Minutes of Exercise per Session:   Stress:   . Feeling of Stress :   Social Connections:   . Frequency of Communication with Friends and Family:   . Frequency of Social Gatherings with Friends and Family:   . Attends Religious Services:   . Active Member of Clubs or Organizations:   . Attends Archivist Meetings:   Marland Kitchen Marital Status:   Intimate Partner Violence:   . Fear of Current or Ex-Partner:   . Emotionally Abused:   Marland Kitchen Physically Abused:    . Sexually Abused:     Family History  Problem Relation Age of Onset  . Heart failure Mother   . Atrial fibrillation Mother   . Hypertension Mother   . Diabetes Father   . Heart attack Father   . Breast cancer Maternal Aunt 70  . Breast cancer Maternal Grandmother 80  . Breast cancer Cousin        2 mat cousins  . Hypertension Sister   . Hypertension Sister   . Arthritis Sister   . Leukemia Paternal Aunt   . Leukemia Paternal Uncle   . Brain cancer Paternal Aunt   . Other Brother 13       Drowning accident  . Liver cancer Maternal Uncle   . Alcohol abuse Maternal Uncle   . Prostate cancer Neg Hx   . Kidney cancer Neg Hx      Current Outpatient Medications:  .  acetaminophen (TYLENOL) 500 MG tablet, Take 1,000 mg by mouth every 6 (six) hours as needed (for pain.)., Disp: , Rfl:  .  amitriptyline (ELAVIL) 25 MG tablet, Take 2 tablets (50 mg total) by mouth at bedtime., Disp: 90 tablet, Rfl: 0 .  benzocaine-resorcinol (VAGISIL) 5-2 % vaginal cream, Place 1 application vaginally daily as needed for itching or irritation., Disp: , Rfl:  .  cetirizine (ZYRTEC) 10 MG tablet, Take 10 mg by mouth daily as needed (allergies.). , Disp: , Rfl:  .  dexamethasone (DECADRON) 4 MG tablet, Take 2 tablets (8 mg total) by mouth daily. Start the day after carboplatin chemotherapy for 3 days., Disp: 30 tablet, Rfl: 1 .  diphenhydrAMINE (BENADRYL) 25 MG tablet, Take 25 mg by mouth daily as needed for itching or allergies., Disp: , Rfl:  .  guaiFENesin (MUCINEX) 600 MG 12 hr tablet, Take 600 mg by mouth at bedtime as needed (congestion)., Disp: , Rfl:  .  hydrochlorothiazide (HYDRODIURIL) 25 MG tablet, TAKE 1 TABLET BY MOUTH EVERY DAY, Disp: 30 tablet, Rfl: 0 .  hydrocortisone cream 1 %, Apply 1 application topically daily as needed for itching., Disp: , Rfl:  .  lidocaine-prilocaine (EMLA) cream, Apply to affected area once (Patient taking differently: Apply 1 application topically daily as  needed (prior to port being accessed.). ), Disp: 30 g, Rfl: 3 .  LORazepam (ATIVAN) 0.5 MG tablet, Take 1 tablet (0.5 mg total) by mouth every 6 (six) hours as needed (Nausea/vomiting or anxiety)., Disp: 90 tablet, Rfl: 0 .  magnesium chloride (SLOW-MAG) 64 MG TBEC SR tablet, Take 1 tablet (64 mg total) by mouth daily., Disp: 60 tablet, Rfl: 0 .  omeprazole (PRILOSEC) 20 MG capsule, Take 20 mg by mouth daily as needed (acid reflux)., Disp: , Rfl:  .  ondansetron (ZOFRAN) 8 MG tablet, Take 1 tablet (  8 mg total) by mouth 2 (two) times daily as needed for refractory nausea / vomiting. Start on day 3 after carboplatin chemo., Disp: 30 tablet, Rfl: 1 .  polyethylene glycol (MIRALAX / GLYCOLAX) 17 g packet, Take 17 g by mouth 2 (two) times daily., Disp: , Rfl:  .  potassium chloride SA (KLOR-CON) 10 MEQ tablet, Take 1 tablet (10 mEq total) by mouth 2 (two) times daily., Disp: 60 tablet, Rfl: 0 .  prochlorperazine (COMPAZINE) 10 MG tablet, Take 1 tablet (10 mg total) by mouth every 6 (six) hours as needed (Nausea or vomiting)., Disp: 30 tablet, Rfl: 1 .  psyllium (METAMUCIL) 0.52 g capsule, Take 0.52 g by mouth in the morning and at bedtime., Disp: , Rfl:  .  rOPINIRole (REQUIP) 0.5 MG tablet, Take 1 tablet (0.5 mg total) by mouth at bedtime for 7 days, THEN 2 tablets (1 mg total) at bedtime for 7 days., Disp: 21 tablet, Rfl: 0 .  senna (SENOKOT) 8.6 MG tablet, Take 1 tablet by mouth daily as needed. , Disp: , Rfl:  .  fluticasone (FLONASE) 50 MCG/ACT nasal spray, Place 2 sprays into both nostrils daily. (Patient not taking: Reported on 10/13/2019), Disp: 16 g, Rfl: 0 .  oxyCODONE-acetaminophen (PERCOCET/ROXICET) 5-325 MG tablet, Take 1-2 tablets by mouth every 4 (four) hours as needed for moderate pain. (Patient not taking: Reported on 09/23/2019), Disp: 40 tablet, Rfl: 0 No current facility-administered medications for this visit.  Facility-Administered Medications Ordered in Other Visits:  .  potassium  chloride 20 mEq in 100 mL IVPB, 20 mEq, Intravenous, Once, Verlon Au, NP, Last Rate: 100 mL/hr at 10/16/19 0958, 20 mEq at 10/16/19 0958  Physical exam:  Vitals:   10/16/19 0918  BP: 139/68  Pulse: 81  Resp: 20  Temp: 97.9 F (36.6 C)  TempSrc: Tympanic   Physical Exam Constitutional:      General: She is not in acute distress.    Appearance: She is well-developed.     Comments: Unaccompanied. Wearing mask.   HENT:     Head: Atraumatic.     Nose: Nose normal.     Mouth/Throat:     Pharynx: No oropharyngeal exudate.  Eyes:     General: No scleral icterus.    Conjunctiva/sclera: Conjunctivae normal.  Pulmonary:     Effort: Pulmonary effort is normal. No respiratory distress.  Abdominal:     General: There is no distension.     Tenderness: There is no guarding.  Musculoskeletal:        General: No deformity. Normal range of motion.     Cervical back: Neck supple.  Skin:    General: Skin is warm and dry.     Coloration: Skin is not pale.  Neurological:     Mental Status: She is alert and oriented to person, place, and time.     Motor: No weakness.  Psychiatric:        Mood and Affect: Mood normal.        Behavior: Behavior normal.      CMP Latest Ref Rng & Units 10/16/2019  Glucose 70 - 99 mg/dL 115(H)  BUN 8 - 23 mg/dL 17  Creatinine 0.44 - 1.00 mg/dL 0.88  Sodium 135 - 145 mmol/L 129(L)  Potassium 3.5 - 5.1 mmol/L 3.2(L)  Chloride 98 - 111 mmol/L 94(L)  CO2 22 - 32 mmol/L 24  Calcium 8.9 - 10.3 mg/dL 9.9  Total Protein 6.5 - 8.1 g/dL 6.1(L)  Total Bilirubin 0.3 -  1.2 mg/dL 0.3  Alkaline Phos 38 - 126 U/L 91  AST 15 - 41 U/L 17  ALT 0 - 44 U/L 21   CBC Latest Ref Rng & Units 10/16/2019  WBC 4.0 - 10.5 K/uL 10.5  Hemoglobin 12.0 - 15.0 g/dL 12.0  Hematocrit 36.0 - 46.0 % 35.1(L)  Platelets 150 - 400 K/uL 256    No images are attached to the encounter.  CT ABDOMEN WO CONTRAST  Result Date: 09/29/2019 CLINICAL DATA:  Ovarian cancer restaging. Lower  abdominal pain. EXAM: CT CHEST AND ABDOMEN WITHOUT CONTRAST TECHNIQUE: Multidetector CT imaging of the chest and abdomen was performed following the standard protocol without IV contrast. COMPARISON:  None. FINDINGS: CT CHEST FINDINGS Cardiovascular: Port-A-Cath tip: Lower SVC. Atherosclerotic calcification of the thoracic aorta and branch vessels. Mediastinum/Nodes: Contrast medium in the distal esophagus suggesting reflux or dysmotility. Oval-shaped structure posterior to the upper thoracic esophagus, probably a lymph node, 0.7 cm in short axis on image 8/2. Nodularity along the inferior margin of the thyroid isthmus, contiguous with the thyroid gland, measuring 2.1 by 1.9 by 1.9 cm. Anterior mediastinal nodule with roughly similar density to the thyroid gland but separate from the thyroid, measuring 2.5 by 1.5 by 3.4 cm, with punctate calcifications posteriorly. Right hilar node 1.0 cm in short axis on image 73/4. Lungs/Pleura: Biapical pleuroparenchymal scarring. 0.4 by 0.2 cm nodule in the left lower lobe on image 88/3 mild airway plugging in the left upper lobe on image 43/3. 2 mm subpleural nodule along the left major fissure inferiorly on image 124/3. Linear subsegmental atelectasis or scarring peripherally in the right upper lobe on image 51/3. Musculoskeletal: Incidental Schmorl's nodes in the midthoracic spine. CT ABDOMEN PELVIS FINDINGS Hepatobiliary: Cholecystectomy. Otherwise unremarkable. Pancreas: Unremarkable Spleen: Unremarkable Adrenals/Urinary Tract: Unremarkable Stomach/Bowel: Unremarkable Vascular/Lymphatic: Aortoiliac atherosclerotic vascular disease. No pathologic adenopathy in the upper abdomen. Other: Prior laparotomy with some faint stranding along the anterior omentum in the immediate vicinity of the laparotomy site. Musculoskeletal: Multilevel lumbar degenerative disc disease. IMPRESSION: 1. Suspected thyroid nodule along the inferior isthmus margin of the thyroid gland along with a  separate anterior mediastinal fairly high density mass with punctate calcifications. 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 Thyroid scintigraphy should be considered to further characterize these lesions; if negative on scintigraphy then further workup to exclude the possibility of lymphoma or thymic tumor would be suggested. 2. Contrast medium in the distal esophagus suggesting reflux or dysmotility. 3. Several tiny pulmonary nodules are present, 3 mm in average diameter or less. No follow-up needed if patient is low-risk (and has no known or suspected primary neoplasm). Non-contrast chest CT can be considered in 12 months if patient is high-risk. This recommendation follows the consensus statement: Guidelines for Management of Incidental Pulmonary Nodules Detected on CT Images: From the Fleischner Society 2017; Radiology 2017; 284:228-243. 4. Aortoiliac atherosclerotic vascular disease. 5. Multilevel lumbar degenerative disc disease. 6. Prior laparotomy with some faint stranding along the anterior omentum in the immediate vicinity of the laparotomy site, likely from benign scarring. Aortic Atherosclerosis (ICD10-I70.0). Electronically Signed   By: Van Clines M.D.   On: 09/29/2019 13:08   CT CHEST WO CONTRAST  Result Date: 09/29/2019 CLINICAL DATA:  Ovarian cancer restaging. Lower abdominal pain. EXAM: CT CHEST AND ABDOMEN WITHOUT CONTRAST TECHNIQUE: Multidetector CT imaging of the chest and abdomen was performed following the standard protocol without IV contrast. COMPARISON:  None. FINDINGS: CT CHEST  FINDINGS Cardiovascular: Port-A-Cath tip: Lower SVC. Atherosclerotic calcification of the thoracic aorta and branch vessels. Mediastinum/Nodes: Contrast medium in the distal esophagus suggesting reflux or dysmotility. Oval-shaped structure posterior to the upper thoracic esophagus, probably a lymph  node, 0.7 cm in short axis on image 8/2. Nodularity along the inferior margin of the thyroid isthmus, contiguous with the thyroid gland, measuring 2.1 by 1.9 by 1.9 cm. Anterior mediastinal nodule with roughly similar density to the thyroid gland but separate from the thyroid, measuring 2.5 by 1.5 by 3.4 cm, with punctate calcifications posteriorly. Right hilar node 1.0 cm in short axis on image 73/4. Lungs/Pleura: Biapical pleuroparenchymal scarring. 0.4 by 0.2 cm nodule in the left lower lobe on image 88/3 mild airway plugging in the left upper lobe on image 43/3. 2 mm subpleural nodule along the left major fissure inferiorly on image 124/3. Linear subsegmental atelectasis or scarring peripherally in the right upper lobe on image 51/3. Musculoskeletal: Incidental Schmorl's nodes in the midthoracic spine. CT ABDOMEN PELVIS FINDINGS Hepatobiliary: Cholecystectomy. Otherwise unremarkable. Pancreas: Unremarkable Spleen: Unremarkable Adrenals/Urinary Tract: Unremarkable Stomach/Bowel: Unremarkable Vascular/Lymphatic: Aortoiliac atherosclerotic vascular disease. No pathologic adenopathy in the upper abdomen. Other: Prior laparotomy with some faint stranding along the anterior omentum in the immediate vicinity of the laparotomy site. Musculoskeletal: Multilevel lumbar degenerative disc disease. IMPRESSION: 1. Suspected thyroid nodule along the inferior isthmus margin of the thyroid gland along with a separate anterior mediastinal fairly high density mass with punctate calcifications. 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 Thyroid scintigraphy should be considered to further characterize these lesions; if negative on scintigraphy then further workup to exclude the possibility of lymphoma or thymic tumor would be suggested. 2. Contrast medium in the distal esophagus suggesting reflux or dysmotility. 3. Several tiny  pulmonary nodules are present, 3 mm in average diameter or less. No follow-up needed if patient is low-risk (and has no known or suspected primary neoplasm). Non-contrast chest CT can be considered in 12 months if patient is high-risk. This recommendation follows the consensus statement: Guidelines for Management of Incidental Pulmonary Nodules Detected on CT Images: From the Fleischner Society 2017; Radiology 2017; 284:228-243. 4. Aortoiliac atherosclerotic vascular disease. 5. Multilevel lumbar degenerative disc disease. 6. Prior laparotomy with some faint stranding along the anterior omentum in the immediate vicinity of the laparotomy site, likely from benign scarring. Aortic Atherosclerosis (ICD10-I70.0). Electronically Signed   By: Van Clines M.D.   On: 09/29/2019 13:08    Assessment and plan- Patient is a 74 y.o. female diagnosed with high-grade serous ovarian cancer currently status post cycle 2 of adjuvant carbo-Taxol chemotherapy with Neulasta support who presents to symptom management clinic for follow-up for multiple complaints.  1.  Diarrhea-in setting of history of IBS.  Improved.  Continue with increased dose of amitriptyline to 50 mg at bedtime per GI previously.  MiraLAX if no BM in 24 hours and take 1 dose once to twice daily as needed to achieve bowel movement.  Okay to take Imodium if greater than 3 stools in 24-hour..  2.  Orthostasis-secondary to fluid volume deficit given hyponatremia and weight loss.  Fall precautions again reinforced.  Use walker as needed.  IV fluids in clinic today.  3.  Hyponatremia- Na 129 today.  Slightly improved.  Encouraged oral hydration.  IV fluids today.  Samples of Ensure hydration provided.  4.  Hypokalemia- K 3.2 today.  Improved.  20 mEq potassium chloride IV in clinic  today.  Continue 10 mEq tablets twice daily for total of 20 mEq daily.  Tolerating well.  5.  Insomnia-improved.  Continue Ativan at bedtime as needed for anxiety.  6.   Restless leg-improved.  Electrolyte abnormalities likely contributing given improvement in symptoms after IV fluids and potassium supplementation.  Continue plan for second dose of Feraheme given iron deficiency anemia with Dr. Janese Banks.  Continue Requip at 0.5 mg at bedtime for 1 week then increase to 1 mg at bedtime.  Will evaluate for symptom control after.  Can consider up titration as needed based on symptom of suspect resolution of electrolyte abnormalities may yield improvement as well.   7.  Weight loss-secondary to poor appetite and dehydration.  Awaiting appointment with Jennet Maduro, RD. extensive discussion regarding intake of calorie and protein dense meals throughout the day.  Provided samples of Ensure & Boost today.   Disposition: Return to clinic in 1 week for labs and reevaluation.  Return to clinic sooner symptoms do not continue to improve or worsen in the interim.   Visit Diagnosis 1. Chemotherapy induced diarrhea   2. Hyponatremia   3. Hypokalemia   4. Restless leg   5. Weight loss, unintentional     Patient expressed understanding and was in agreement with this plan. She also understands that She can call clinic at any time with any questions, concerns, or complaints.   Thank you for allowing me to participate in the care of this very pleasant patient.   Beckey Rutter, DNP, AGNP-C [facility]  CC: Dr. Janese Banks

## 2019-10-20 NOTE — Progress Notes (Signed)
Pharmacist Chemotherapy Monitoring - Follow Up Assessment    I verify that I have reviewed each item in the below checklist:  . Regimen for the patient is scheduled for the appropriate day and plan matches scheduled date. Marland Kitchen Appropriate non-routine labs are ordered dependent on drug ordered. . If applicable, additional medications reviewed and ordered per protocol based on lifetime cumulative doses and/or treatment regimen.   Plan for follow-up and/or issues identified: No . I-vent associated with next due treatment: No . MD and/or nursing notified: No  Carrie Roberson K 10/20/2019 8:45 AM

## 2019-10-23 ENCOUNTER — Encounter: Payer: Self-pay | Admitting: Nurse Practitioner

## 2019-10-23 ENCOUNTER — Inpatient Hospital Stay: Payer: Medicare Other

## 2019-10-23 ENCOUNTER — Inpatient Hospital Stay (HOSPITAL_BASED_OUTPATIENT_CLINIC_OR_DEPARTMENT_OTHER): Payer: Medicare Other | Admitting: Nurse Practitioner

## 2019-10-23 ENCOUNTER — Other Ambulatory Visit: Payer: Self-pay

## 2019-10-23 ENCOUNTER — Inpatient Hospital Stay: Payer: Medicare Other | Attending: Nurse Practitioner

## 2019-10-23 VITALS — BP 117/77 | HR 79 | Temp 97.8°F | Resp 20

## 2019-10-23 DIAGNOSIS — Z803 Family history of malignant neoplasm of breast: Secondary | ICD-10-CM | POA: Diagnosis not present

## 2019-10-23 DIAGNOSIS — E876 Hypokalemia: Secondary | ICD-10-CM | POA: Insufficient documentation

## 2019-10-23 DIAGNOSIS — K521 Toxic gastroenteritis and colitis: Secondary | ICD-10-CM

## 2019-10-23 DIAGNOSIS — C561 Malignant neoplasm of right ovary: Secondary | ICD-10-CM | POA: Insufficient documentation

## 2019-10-23 DIAGNOSIS — Z806 Family history of leukemia: Secondary | ICD-10-CM | POA: Insufficient documentation

## 2019-10-23 DIAGNOSIS — R945 Abnormal results of liver function studies: Secondary | ICD-10-CM | POA: Diagnosis not present

## 2019-10-23 DIAGNOSIS — Z7952 Long term (current) use of systemic steroids: Secondary | ICD-10-CM | POA: Insufficient documentation

## 2019-10-23 DIAGNOSIS — Z5189 Encounter for other specified aftercare: Secondary | ICD-10-CM | POA: Insufficient documentation

## 2019-10-23 DIAGNOSIS — G2581 Restless legs syndrome: Secondary | ICD-10-CM | POA: Diagnosis not present

## 2019-10-23 DIAGNOSIS — Z79899 Other long term (current) drug therapy: Secondary | ICD-10-CM | POA: Insufficient documentation

## 2019-10-23 DIAGNOSIS — E538 Deficiency of other specified B group vitamins: Secondary | ICD-10-CM | POA: Insufficient documentation

## 2019-10-23 DIAGNOSIS — Z800 Family history of malignant neoplasm of digestive organs: Secondary | ICD-10-CM | POA: Diagnosis not present

## 2019-10-23 DIAGNOSIS — Z811 Family history of alcohol abuse and dependence: Secondary | ICD-10-CM | POA: Insufficient documentation

## 2019-10-23 DIAGNOSIS — Z833 Family history of diabetes mellitus: Secondary | ICD-10-CM | POA: Insufficient documentation

## 2019-10-23 DIAGNOSIS — D701 Agranulocytosis secondary to cancer chemotherapy: Secondary | ICD-10-CM | POA: Insufficient documentation

## 2019-10-23 DIAGNOSIS — E860 Dehydration: Secondary | ICD-10-CM | POA: Insufficient documentation

## 2019-10-23 DIAGNOSIS — K589 Irritable bowel syndrome without diarrhea: Secondary | ICD-10-CM | POA: Diagnosis not present

## 2019-10-23 DIAGNOSIS — Z95828 Presence of other vascular implants and grafts: Secondary | ICD-10-CM

## 2019-10-23 DIAGNOSIS — R634 Abnormal weight loss: Secondary | ICD-10-CM | POA: Insufficient documentation

## 2019-10-23 DIAGNOSIS — Z9079 Acquired absence of other genital organ(s): Secondary | ICD-10-CM | POA: Insufficient documentation

## 2019-10-23 DIAGNOSIS — R630 Anorexia: Secondary | ICD-10-CM | POA: Insufficient documentation

## 2019-10-23 DIAGNOSIS — F419 Anxiety disorder, unspecified: Secondary | ICD-10-CM | POA: Diagnosis not present

## 2019-10-23 DIAGNOSIS — D649 Anemia, unspecified: Secondary | ICD-10-CM | POA: Insufficient documentation

## 2019-10-23 DIAGNOSIS — Z90722 Acquired absence of ovaries, bilateral: Secondary | ICD-10-CM | POA: Insufficient documentation

## 2019-10-23 DIAGNOSIS — E871 Hypo-osmolality and hyponatremia: Secondary | ICD-10-CM

## 2019-10-23 DIAGNOSIS — Z90710 Acquired absence of both cervix and uterus: Secondary | ICD-10-CM | POA: Insufficient documentation

## 2019-10-23 DIAGNOSIS — Z5111 Encounter for antineoplastic chemotherapy: Secondary | ICD-10-CM

## 2019-10-23 DIAGNOSIS — R7989 Other specified abnormal findings of blood chemistry: Secondary | ICD-10-CM

## 2019-10-23 DIAGNOSIS — T451X5A Adverse effect of antineoplastic and immunosuppressive drugs, initial encounter: Secondary | ICD-10-CM | POA: Diagnosis not present

## 2019-10-23 DIAGNOSIS — Z8261 Family history of arthritis: Secondary | ICD-10-CM | POA: Insufficient documentation

## 2019-10-23 DIAGNOSIS — Z8249 Family history of ischemic heart disease and other diseases of the circulatory system: Secondary | ICD-10-CM | POA: Insufficient documentation

## 2019-10-23 DIAGNOSIS — D709 Neutropenia, unspecified: Secondary | ICD-10-CM

## 2019-10-23 DIAGNOSIS — Z808 Family history of malignant neoplasm of other organs or systems: Secondary | ICD-10-CM | POA: Insufficient documentation

## 2019-10-23 LAB — COMPREHENSIVE METABOLIC PANEL
ALT: 258 U/L — ABNORMAL HIGH (ref 0–44)
AST: 118 U/L — ABNORMAL HIGH (ref 15–41)
Albumin: 3.5 g/dL (ref 3.5–5.0)
Alkaline Phosphatase: 140 U/L — ABNORMAL HIGH (ref 38–126)
Anion gap: 9 (ref 5–15)
BUN: 14 mg/dL (ref 8–23)
CO2: 26 mmol/L (ref 22–32)
Calcium: 10 mg/dL (ref 8.9–10.3)
Chloride: 98 mmol/L (ref 98–111)
Creatinine, Ser: 0.73 mg/dL (ref 0.44–1.00)
GFR calc Af Amer: >60 mL/min (ref >60)
GFR calc non Af Amer: >60 mL/min (ref >60)
Glucose, Bld: 120 mg/dL — ABNORMAL HIGH (ref 70–99)
Potassium: 3.4 mmol/L — ABNORMAL LOW (ref 3.5–5.1)
Sodium: 133 mmol/L — ABNORMAL LOW (ref 135–145)
Total Bilirubin: 0.3 mg/dL (ref 0.3–1.2)
Total Protein: 6.1 g/dL — ABNORMAL LOW (ref 6.5–8.1)

## 2019-10-23 LAB — CBC WITH DIFFERENTIAL/PLATELET
Abs Immature Granulocytes: 0.02 10*3/uL (ref 0.00–0.07)
Basophils Absolute: 0 10*3/uL (ref 0.0–0.1)
Basophils Relative: 2 %
Eosinophils Absolute: 0 10*3/uL (ref 0.0–0.5)
Eosinophils Relative: 1 %
HCT: 36.1 % (ref 36.0–46.0)
Hemoglobin: 12.4 g/dL (ref 12.0–15.0)
Immature Granulocytes: 1 %
Lymphocytes Relative: 43 %
Lymphs Abs: 0.9 10*3/uL (ref 0.7–4.0)
MCH: 30 pg (ref 26.0–34.0)
MCHC: 34.3 g/dL (ref 30.0–36.0)
MCV: 87.4 fL (ref 80.0–100.0)
Monocytes Absolute: 0.4 10*3/uL (ref 0.1–1.0)
Monocytes Relative: 19 %
Neutro Abs: 0.7 10*3/uL — ABNORMAL LOW (ref 1.7–7.7)
Neutrophils Relative %: 34 %
Platelets: 209 10*3/uL (ref 150–400)
RBC: 4.13 MIL/uL (ref 3.87–5.11)
RDW: 16.6 % — ABNORMAL HIGH (ref 11.5–15.5)
WBC: 2.1 10*3/uL — ABNORMAL LOW (ref 4.0–10.5)
nRBC: 0 % (ref 0.0–0.2)

## 2019-10-23 MED ORDER — SODIUM CHLORIDE 0.9 % IV SOLN
Freq: Once | INTRAVENOUS | Status: AC
  Filled 2019-10-23: qty 250

## 2019-10-23 MED ORDER — SODIUM CHLORIDE 0.9% FLUSH
10.0000 mL | Freq: Once | INTRAVENOUS | Status: AC
  Administered 2019-10-23: 10 mL via INTRAVENOUS
  Filled 2019-10-23: qty 10

## 2019-10-23 MED ORDER — HEPARIN SOD (PORK) LOCK FLUSH 100 UNIT/ML IV SOLN
500.0000 [IU] | Freq: Once | INTRAVENOUS | Status: AC
  Administered 2019-10-23: 500 [IU] via INTRAVENOUS
  Filled 2019-10-23: qty 5

## 2019-10-23 NOTE — Progress Notes (Signed)
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[place])  08/19/2019 Initial Diagnosis   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[place])   09/07/2019 Cancer Staging   Staging form: Ovary, Fallopian Tube, and Primary Peritoneal Carcinoma, AJCC 8th Edition - Pathologic stage from 09/07/2019: FIGO Stage IC2, calculated as Stage IC (pT1c2, pN0, cM0) - Signed by Sindy Guadeloupe, MD on 09/07/2019   09/15/2019 -  Chemotherapy   The patient had palonosetron (ALOXI) injection 0.25 mg, 0.25 mg, Intravenous,  Once, 2 of 6 cycles Administration: 0.25 mg (09/15/2019), 0.25 mg (10/06/2019) pegfilgrastim (NEULASTA ONPRO KIT) injection 6 mg, 6 mg, Subcutaneous, Once, 2 of 6 cycles Administration: 6 mg (09/15/2019), 6 mg (10/06/2019) CARBOplatin (PARAPLATIN) 350 mg in sodium chloride 0.9 % 250 mL chemo infusion, 350 mg (100 % of original dose 354.8 mg), Intravenous,  Once, 2 of 6 cycles Dose modification:   (original dose 354.8 mg, Cycle 1) Administration: 350 mg (09/15/2019), 350 mg (10/06/2019) fosaprepitant (EMEND) 150 mg in sodium chloride 0.9 % 145 mL IVPB, 150 mg, Intravenous,  Once, 2 of 6 cycles Administration: 150 mg (09/15/2019), 150 mg (10/06/2019) PACLitaxel (TAXOL) 336 mg in sodium chloride 0.9 % 500 mL chemo infusion (> 9m/m2), 175 mg/m2 = 336 mg, Intravenous,  Once, 2 of 6 cycles Administration: 336 mg (09/15/2019), 336 mg  (10/06/2019)  for chemotherapy treatment.      Interval history- Carrie Roberson 74year old female diagnosed with ovarian cancer, currently receiving adjuvant carbo-Taxol chemotherapy with Neulasta support, presents to symptom management clinic for reevaluation of multiple complaints including diarrhea, dehydration, electrolyte abnormalities, anxiety, poor sleep, anemia, restless leg.  She was last seen in symptom management clinic on 10/16/2019 and received IV fluids and IV potassium.  Since that time, she says that she has felt better.  Had a normal bowel movement this morning.  Continues to try to increase oral hydration.  Some nausea with p.m. potassium dose.  Restless leg has resolved.  Bone pain and myalgias have resolved.  No falls. Has generalized fatigue. Sleeping better.   ECOG FS:1 - Symptomatic but completely ambulatory  Review of systems- Review of Systems  Constitutional: Positive for malaise/fatigue. Negative for chills, fever and weight loss.  HENT: Negative for hearing loss, nosebleeds, sore throat and tinnitus.   Eyes: Negative for blurred vision and double vision.  Respiratory: Negative for cough, hemoptysis, shortness of breath and wheezing.   Cardiovascular: Negative for chest pain, palpitations and leg swelling.  Gastrointestinal: Negative for abdominal pain, blood in stool, constipation, diarrhea, melena, nausea and vomiting.  Genitourinary: Negative for dysuria and urgency.  Musculoskeletal: Negative for back pain, falls, joint pain and myalgias.  Skin: Negative for itching and rash.  Neurological: Negative for dizziness, tingling, sensory change, loss of consciousness, weakness and headaches.  Endo/Heme/Allergies: Negative for environmental allergies. Does not bruise/bleed easily.  Psychiatric/Behavioral: Negative for depression. The patient is not nervous/anxious (improved) and does not have insomnia (improved).      Current treatment-carbo-Taxol with Neulasta  support  Allergies  Allergen  Reactions  . Citalopram Nausea And Vomiting  . Ivp Dye [Iodinated Diagnostic Agents] Hives and Swelling    hives  . Meloxicam     unknown  . Adhesive [Tape] Rash  . Amlodipine Rash  . Ampicillin Rash    Did it involve swelling of the face/tongue/throat, SOB, or low BP? No Did it involve sudden or severe rash/hives, skin peeling, or any reaction on the inside of your mouth or nose? Yes Did you need to seek medical attention at a hospital or doctor's office? Yes When did it last happen? 10 + years If all above answers are "NO", may proceed with cephalosporin use.  . Cefoxitin Rash  . Estradiol Rash  . Estropipate Rash  . Olmesartan Medoxomil-Hctz Rash  . Sertraline Rash       . Sulfa Antibiotics Rash    Past Medical History:  Diagnosis Date  . Anxiety   . Arthritis   . Family history of breast cancer   . Family history of leukemia   . GERD (gastroesophageal reflux disease)   . History of ischemic colitis 09/02/2017  . Hx of colonic polyp   . Hypertension    H/O-PT TAKEN OFF BY PCP AS OF 2019  . IBS (irritable bowel syndrome)   . Ovarian cancer ([place])   . Ovarian cyst   . Vaginal atrophy     Past Surgical History:  Procedure Laterality Date  . BREAST BIOPSY Left    needle bx/clip-neg  . CERVICAL BIOPSY  W/ LOOP ELECTRODE EXCISION    . CHOLECYSTECTOMY    . Colonoscopoy  2007, 2008, 2011  . COLONOSCOPY WITH PROPOFOL N/A 05/05/2018   Procedure: COLONOSCOPY WITH PROPOFOL;  Surgeon: Lin Landsman, MD;  Location: [facility];  Service: Gastroenterology;  Laterality: N/A;  . HYSTERECTOMY ABDOMINAL WITH SALPINGO-OOPHORECTOMY N/A 08/19/2019   Procedure: HYSTERECTOMY ABDOMINAL WITH SALPINGO-OOPHORECTOMY WITH PELVIC WASHINGS PERITONEAL BIOPSIES AND PARA AORTIC LYMPH NODE DISSECTION;  Surgeon: [name], MD;  Location: [facility] ORS;  Service: Gynecology;  Laterality: N/A;  . LEEP    . OMENTECTOMY N/A 08/19/2019   Procedure:  OMENTECTOMY;  Surgeon: [name], MD;  Location: [facility] ORS;  Service: Gynecology;  Laterality: N/A;  . PORTA CATH INSERTION N/A 09/11/2019   Procedure: PORTA CATH INSERTION;  Surgeon: Algernon Huxley, MD;  Location: [place] [facility];  Service: Cardiovascular;  Laterality: N/A;  . TUBAL LIGATION  1999    Social History   Socioeconomic History  . Marital status: Single    Spouse name: Not on file  . Number of children: 0  . Years of education: Not on file  . Highest education level: Associate degree: academic program  Occupational History  . Occupation: Retired  Tobacco Use  . Smoking status: Never Smoker  . Smokeless tobacco: Never Used  . Tobacco comment: smoking cessation materials not required  Substance and Sexual Activity  . Alcohol use: Yes    Comment: rarely  . Drug use: No  . Sexual activity: Never    Birth control/protection: Post-menopausal  Other Topics Concern  . Not on file  Social History Narrative  . Not on file   Social Determinants of Health   Financial Resource Strain:   . Difficulty of Paying Living Expenses:   Food Insecurity:   . Worried About Charity fundraiser in the Last Year:   . Arboriculturist in the Last Year:   Transportation Needs:   . Film/video editor (Medical):   Marland Kitchen Lack of  Transportation (Non-Medical):   Physical Activity:   . Days of Exercise per Week:   . Minutes of Exercise per Session:   Stress:   . Feeling of Stress :   Social Connections:   . Frequency of Communication with Friends and Family:   . Frequency of Social Gatherings with Friends and Family:   . Attends Religious Services:   . Active Member of Clubs or Organizations:   . Attends Archivist Meetings:   Marland Kitchen Marital Status:   Intimate Partner Violence:   . Fear of Current or Ex-Partner:   . Emotionally Abused:   Marland Kitchen Physically Abused:   . Sexually Abused:     Family History  Problem Relation Age of Onset  . Heart failure Mother   . Atrial  fibrillation Mother   . Hypertension Mother   . Diabetes Father   . Heart attack Father   . Breast cancer Maternal Aunt 70  . Breast cancer Maternal Grandmother 80  . Breast cancer Cousin        2 mat cousins  . Hypertension Sister   . Hypertension Sister   . Arthritis Sister   . Leukemia Paternal Aunt   . Leukemia Paternal Uncle   . Brain cancer Paternal Aunt   . Other Brother 13       Drowning accident  . Liver cancer Maternal Uncle   . Alcohol abuse Maternal Uncle   . Prostate cancer Neg Hx   . Kidney cancer Neg Hx      Current Outpatient Medications:  .  acetaminophen (TYLENOL) 500 MG tablet, Take 1,000 mg by mouth every 6 (six) hours as needed (for pain.)., Disp: , Rfl:  .  amitriptyline (ELAVIL) 25 MG tablet, Take 2 tablets (50 mg total) by mouth at bedtime., Disp: 90 tablet, Rfl: 0 .  benzocaine-resorcinol (VAGISIL) 5-2 % vaginal cream, Place 1 application vaginally daily as needed for itching or irritation., Disp: , Rfl:  .  cetirizine (ZYRTEC) 10 MG tablet, Take 10 mg by mouth daily as needed (allergies.). , Disp: , Rfl:  .  dexamethasone (DECADRON) 4 MG tablet, Take 2 tablets (8 mg total) by mouth daily. Start the day after carboplatin chemotherapy for 3 days., Disp: 30 tablet, Rfl: 1 .  diphenhydrAMINE (BENADRYL) 25 MG tablet, Take 25 mg by mouth daily as needed for itching or allergies., Disp: , Rfl:  .  fluticasone (FLONASE) 50 MCG/ACT nasal spray, Place 2 sprays into both nostrils daily. (Patient not taking: Reported on 10/13/2019), Disp: 16 g, Rfl: 0 .  guaiFENesin (MUCINEX) 600 MG 12 hr tablet, Take 600 mg by mouth at bedtime as needed (congestion)., Disp: , Rfl:  .  hydrochlorothiazide (HYDRODIURIL) 25 MG tablet, TAKE 1 TABLET BY MOUTH EVERY DAY, Disp: 30 tablet, Rfl: 0 .  hydrocortisone cream 1 %, Apply 1 application topically daily as needed for itching., Disp: , Rfl:  .  lidocaine-prilocaine (EMLA) cream, Apply to affected area once (Patient taking differently:  Apply 1 application topically daily as needed (prior to port being accessed.). ), Disp: 30 g, Rfl: 3 .  LORazepam (ATIVAN) 0.5 MG tablet, Take 1 tablet (0.5 mg total) by mouth every 6 (six) hours as needed (Nausea/vomiting or anxiety)., Disp: 90 tablet, Rfl: 0 .  magnesium chloride (SLOW-MAG) 64 MG TBEC SR tablet, Take 1 tablet (64 mg total) by mouth daily., Disp: 60 tablet, Rfl: 0 .  omeprazole (PRILOSEC) 20 MG capsule, Take 20 mg by mouth daily as needed (acid reflux)., Disp: ,  Rfl:  .  ondansetron (ZOFRAN) 8 MG tablet, Take 1 tablet (8 mg total) by mouth 2 (two) times daily as needed for refractory nausea / vomiting. Start on day 3 after carboplatin chemo., Disp: 30 tablet, Rfl: 1 .  oxyCODONE-acetaminophen (PERCOCET/ROXICET) 5-325 MG tablet, Take 1-2 tablets by mouth every 4 (four) hours as needed for moderate pain. (Patient not taking: Reported on 09/23/2019), Disp: 40 tablet, Rfl: 0 .  polyethylene glycol (MIRALAX / GLYCOLAX) 17 g packet, Take 17 g by mouth 2 (two) times daily., Disp: , Rfl:  .  potassium chloride SA (KLOR-CON) 10 MEQ tablet, Take 1 tablet (10 mEq total) by mouth 2 (two) times daily., Disp: 60 tablet, Rfl: 0 .  prochlorperazine (COMPAZINE) 10 MG tablet, Take 1 tablet (10 mg total) by mouth every 6 (six) hours as needed (Nausea or vomiting)., Disp: 30 tablet, Rfl: 1 .  psyllium (METAMUCIL) 0.52 g capsule, Take 0.52 g by mouth in the morning and at bedtime., Disp: , Rfl:  .  rOPINIRole (REQUIP) 0.5 MG tablet, Take 1 tablet (0.5 mg total) by mouth at bedtime for 7 days, THEN 2 tablets (1 mg total) at bedtime for 7 days., Disp: 21 tablet, Rfl: 0 .  senna (SENOKOT) 8.6 MG tablet, Take 1 tablet by mouth daily as needed. , Disp: , Rfl:   Physical exam:  There were no vitals filed for this visit. Physical Exam Constitutional:      General: She is not in acute distress.    Appearance: She is well-developed.     Comments: Unaccompanied. Wearing mask.   HENT:     Head: Atraumatic.      Nose: Nose normal.     Mouth/Throat:     Pharynx: No oropharyngeal exudate.  Eyes:     General: No scleral icterus.    Conjunctiva/sclera: Conjunctivae normal.  Pulmonary:     Effort: Pulmonary effort is normal. No respiratory distress.  Abdominal:     General: There is no distension.     Tenderness: There is no guarding.  Musculoskeletal:        General: No deformity. Normal range of motion.     Cervical back: Neck supple.  Skin:    General: Skin is warm and dry.     Coloration: Skin is not pale.  Neurological:     Mental Status: She is alert and oriented to person, place, and time.     Motor: No weakness.  Psychiatric:        Mood and Affect: Mood normal.        Behavior: Behavior normal.      CMP Latest Ref Rng & Units 10/23/2019  Glucose 70 - 99 mg/dL 120(H)  BUN 8 - 23 mg/dL 14  Creatinine 0.44 - 1.00 mg/dL 0.73  Sodium 135 - 145 mmol/L 133(L)  Potassium 3.5 - 5.1 mmol/L 3.4(L)  Chloride 98 - 111 mmol/L 98  CO2 22 - 32 mmol/L 26  Calcium 8.9 - 10.3 mg/dL 10.0  Total Protein 6.5 - 8.1 g/dL 6.1(L)  Total Bilirubin 0.3 - 1.2 mg/dL 0.3  Alkaline Phos 38 - 126 U/L 140(H)  AST 15 - 41 U/L 118(H)  ALT 0 - 44 U/L 258(H)   CBC Latest Ref Rng & Units 10/23/2019  WBC 4.0 - 10.5 K/uL 2.1(L)  Hemoglobin 12.0 - 15.0 g/dL 12.4  Hematocrit 36.0 - 46.0 % 36.1  Platelets 150 - 400 K/uL 209    No images are attached to the encounter.  CT ABDOMEN WO  CONTRAST  Result Date: 09/29/2019 CLINICAL DATA:  Ovarian cancer restaging. Lower abdominal pain. EXAM: CT CHEST AND ABDOMEN WITHOUT CONTRAST TECHNIQUE: Multidetector CT imaging of the chest and abdomen was performed following the standard protocol without IV contrast. COMPARISON:  None. FINDINGS: CT CHEST FINDINGS Cardiovascular: Port-A-Cath tip: Lower SVC. Atherosclerotic calcification of the thoracic aorta and branch vessels. Mediastinum/Nodes: Contrast medium in the distal esophagus suggesting reflux or dysmotility. Oval-shaped  structure posterior to the upper thoracic esophagus, probably a lymph node, 0.7 cm in short axis on image 8/2. Nodularity along the inferior margin of the thyroid isthmus, contiguous with the thyroid gland, measuring 2.1 by 1.9 by 1.9 cm. Anterior mediastinal nodule with roughly similar density to the thyroid gland but separate from the thyroid, measuring 2.5 by 1.5 by 3.4 cm, with punctate calcifications posteriorly. Right hilar node 1.0 cm in short axis on image 73/4. Lungs/Pleura: Biapical pleuroparenchymal scarring. 0.4 by 0.2 cm nodule in the left lower lobe on image 88/3 mild airway plugging in the left upper lobe on image 43/3. 2 mm subpleural nodule along the left major fissure inferiorly on image 124/3. Linear subsegmental atelectasis or scarring peripherally in the right upper lobe on image 51/3. Musculoskeletal: Incidental Schmorl's nodes in the midthoracic spine. CT ABDOMEN PELVIS FINDINGS Hepatobiliary: Cholecystectomy. Otherwise unremarkable. Pancreas: Unremarkable Spleen: Unremarkable Adrenals/Urinary Tract: Unremarkable Stomach/Bowel: Unremarkable Vascular/Lymphatic: Aortoiliac atherosclerotic vascular disease. No pathologic adenopathy in the upper abdomen. Other: Prior laparotomy with some faint stranding along the anterior omentum in the immediate vicinity of the laparotomy site. Musculoskeletal: Multilevel lumbar degenerative disc disease. IMPRESSION: 1. Suspected thyroid nodule along the inferior isthmus margin of the thyroid gland along with a separate anterior mediastinal fairly high density mass with punctate calcifications. 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 Thyroid scintigraphy should be considered to further characterize these lesions; if negative on scintigraphy then further workup to exclude the possibility of lymphoma or thymic tumor would be suggested. 2. Contrast medium in  the distal esophagus suggesting reflux or dysmotility. 3. Several tiny pulmonary nodules are present, 3 mm in average diameter or less. No follow-up needed if patient is low-risk (and has no known or suspected primary neoplasm). Non-contrast chest CT can be considered in 12 months if patient is high-risk. This recommendation follows the consensus statement: Guidelines for Management of Incidental Pulmonary Nodules Detected on CT Images: From the Fleischner Society 2017; Radiology 2017; 284:228-243. 4. Aortoiliac atherosclerotic vascular disease. 5. Multilevel lumbar degenerative disc disease. 6. Prior laparotomy with some faint stranding along the anterior omentum in the immediate vicinity of the laparotomy site, likely from benign scarring. Aortic Atherosclerosis (ICD10-I70.0). Electronically Signed   By: Van Clines M.D.   On: 09/29/2019 13:08   CT CHEST WO CONTRAST  Result Date: 09/29/2019 CLINICAL DATA:  Ovarian cancer restaging. Lower abdominal pain. EXAM: CT CHEST AND ABDOMEN WITHOUT CONTRAST TECHNIQUE: Multidetector CT imaging of the chest and abdomen was performed following the standard protocol without IV contrast. COMPARISON:  None. FINDINGS: CT CHEST FINDINGS Cardiovascular: Port-A-Cath tip: Lower SVC. Atherosclerotic calcification of the thoracic aorta and branch vessels. Mediastinum/Nodes: Contrast medium in the distal esophagus suggesting reflux or dysmotility. Oval-shaped structure posterior to the upper thoracic esophagus, probably a lymph node, 0.7 cm in short axis on image 8/2. Nodularity along the inferior margin of the thyroid isthmus, contiguous with the thyroid gland, measuring 2.1 by 1.9 by 1.9 cm. Anterior mediastinal nodule with roughly similar density to the  thyroid gland but separate from the thyroid, measuring 2.5 by 1.5 by 3.4 cm, with punctate calcifications posteriorly. Right hilar node 1.0 cm in short axis on image 73/4. Lungs/Pleura: Biapical pleuroparenchymal scarring. 0.4  by 0.2 cm nodule in the left lower lobe on image 88/3 mild airway plugging in the left upper lobe on image 43/3. 2 mm subpleural nodule along the left major fissure inferiorly on image 124/3. Linear subsegmental atelectasis or scarring peripherally in the right upper lobe on image 51/3. Musculoskeletal: Incidental Schmorl's nodes in the midthoracic spine. CT ABDOMEN PELVIS FINDINGS Hepatobiliary: Cholecystectomy. Otherwise unremarkable. Pancreas: Unremarkable Spleen: Unremarkable Adrenals/Urinary Tract: Unremarkable Stomach/Bowel: Unremarkable Vascular/Lymphatic: Aortoiliac atherosclerotic vascular disease. No pathologic adenopathy in the upper abdomen. Other: Prior laparotomy with some faint stranding along the anterior omentum in the immediate vicinity of the laparotomy site. Musculoskeletal: Multilevel lumbar degenerative disc disease. IMPRESSION: 1. Suspected thyroid nodule along the inferior isthmus margin of the thyroid gland along with a separate anterior mediastinal fairly high density mass with punctate calcifications. 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 Thyroid scintigraphy should be considered to further characterize these lesions; if negative on scintigraphy then further workup to exclude the possibility of lymphoma or thymic tumor would be suggested. 2. Contrast medium in the distal esophagus suggesting reflux or dysmotility. 3. Several tiny pulmonary nodules are present, 3 mm in average diameter or less. No follow-up needed if patient is low-risk (and has no known or suspected primary neoplasm). Non-contrast chest CT can be considered in 12 months if patient is high-risk. This recommendation follows the consensus statement: Guidelines for Management of Incidental Pulmonary Nodules Detected on CT Images: From the Fleischner Society 2017; Radiology 2017; 284:228-243. 4. Aortoiliac  atherosclerotic vascular disease. 5. Multilevel lumbar degenerative disc disease. 6. Prior laparotomy with some faint stranding along the anterior omentum in the immediate vicinity of the laparotomy site, likely from benign scarring. Aortic Atherosclerosis (ICD10-I70.0). Electronically Signed   By: Van Clines M.D.   On: 09/29/2019 13:08    Assessment and plan- Patient is a 74 y.o. female diagnosed with high-grade serous ovarian cancer currently status post cycle 2 of adjuvant carbo-Taxol chemotherapy with Neulasta support who presents to symptom management clinic for follow-up for multiple complaints.  1.  Diarrhea-history of IBS.  Likely chemotherapy-induced.  Continue amitriptyline 50 mg at bedtime per GI.  MiraLAX if no BM within 24 hours.  Okay to increase to twice a day as needed with goal of achieving 1 soft bowel movement every day to every other day without pain or straining.  Okay to use Imodium if greater than 3 stools in 24-hour.  2.  Bonnita Levan to fluid volume deficit in setting of hyponatremia and weight loss.  Improved.  Continue to use walker and encouraged oral hydration.  3.  Hyponatremia-NA 133 today.  Continues to improve and uptrending.  Encouraged oral hydration.  IV fluids in clinic today.  Continue Ensure hydration if tolerated.  4.  Hypokalemia-K3.4 today.  Improved.  Continue 10 mEq tablets twice a day for total of 20 mEq daily.  Encourage potassium rich foods.  Suspect she will need ongoing potassium supplementation in setting of diarrhea secondary to chemotherapy.  5.  Insomnia-improved.  Continue Ativan at bedtime as needed for anxiety.  6.  Restless leg-improved.  Likely secondary to electrolyte abnormalities and iron deficiency anemia.  Continue Requip 1 mg at bedtime; room for up titration if needed in the future.  Plan for second dose of Feraheme with next infusion.  Continue to optimize electrolytes.   7.  Weight loss-secondary to poor appetite and  dehydration.  Improved.  Weight 180 pounds today.  Encourage small frequent calorie and protein dense meals throughout the day.  Ensure and boost and tolerated.  8. Fatigue- likely secondary to stress & treatment. Continue to monitor.  Consider checking TFTs if ongoing.  9.  Abnormal LFTs-likely transient rise in LFTs secondary to chemotherapy.  Bilirubin normal.  Asymptomatic.  Will check labs next week to reevaluate.  Disposition: - IV fluids in clinic today - Add lab encounter for 4/6 visit -Follow-up with Dr. Janese Banks as scheduled -Return to symptom management clinic as needed   Visit Diagnosis 1. Hyponatremia   2. Chemotherapy induced diarrhea   3. Restless leg   4. Hypokalemia   5. Abnormal LFTs     Patient expressed understanding and was in agreement with this plan. She also understands that She can call clinic at any time with any questions, concerns, or complaints.   Thank you for allowing me to participate in the care of this very pleasant patient.   Beckey Rutter, DNP, AGNP-C [facility] [place]  CC: Dr. Janese Banks

## 2019-10-26 ENCOUNTER — Other Ambulatory Visit: Payer: Self-pay | Admitting: Nurse Practitioner

## 2019-10-26 MED ORDER — TRIAMCINOLONE ACETONIDE 0.1 % EX CREA: 1.0000 "application " | TOPICAL_CREAM | Freq: Two times a day (BID) | CUTANEOUS | 0 refills | Status: DC

## 2019-10-26 MED ORDER — FLUCONAZOLE 150 MG PO TABS: 150.0000 mg | ORAL_TABLET | Freq: Every day | ORAL | 1 refills | Status: DC

## 2019-10-26 MED ORDER — BACITRACIN 500 UNIT/GM EX OINT: 1.0000 "application " | TOPICAL_OINTMENT | Freq: Two times a day (BID) | CUTANEOUS | 1 refills | Status: DC

## 2019-10-26 NOTE — Progress Notes (Signed)
Patient is here with Niece, she is doing well but does have a little nausea but taken her meds and feels better

## 2019-10-26 NOTE — Progress Notes (Signed)
Spoke to patient. Rash likely secondary to chemotherapy, grade 1. Will send script for triamcinolone and bacitracin topical. Also complains of vaginal yeast infection like symptoms and requests diflucan. Will send prescription.

## 2019-10-27 ENCOUNTER — Ambulatory Visit: Payer: Medicare Other

## 2019-10-27 ENCOUNTER — Other Ambulatory Visit: Payer: Medicare Other

## 2019-10-27 ENCOUNTER — Inpatient Hospital Stay: Payer: Medicare Other

## 2019-10-27 ENCOUNTER — Inpatient Hospital Stay (HOSPITAL_BASED_OUTPATIENT_CLINIC_OR_DEPARTMENT_OTHER): Payer: Medicare Other | Admitting: Oncology

## 2019-10-27 ENCOUNTER — Other Ambulatory Visit: Payer: Self-pay

## 2019-10-27 ENCOUNTER — Encounter: Payer: Self-pay | Admitting: Oncology

## 2019-10-27 ENCOUNTER — Ambulatory Visit: Payer: Medicare Other | Admitting: Oncology

## 2019-10-27 VITALS — BP 124/81 | HR 95 | Temp 97.4°F | Resp 18 | Wt 178.9 lb

## 2019-10-27 DIAGNOSIS — D701 Agranulocytosis secondary to cancer chemotherapy: Secondary | ICD-10-CM | POA: Diagnosis not present

## 2019-10-27 DIAGNOSIS — C561 Malignant neoplasm of right ovary: Secondary | ICD-10-CM | POA: Diagnosis not present

## 2019-10-27 DIAGNOSIS — E860 Dehydration: Secondary | ICD-10-CM | POA: Diagnosis not present

## 2019-10-27 DIAGNOSIS — Z95828 Presence of other vascular implants and grafts: Secondary | ICD-10-CM

## 2019-10-27 DIAGNOSIS — Z5111 Encounter for antineoplastic chemotherapy: Secondary | ICD-10-CM

## 2019-10-27 DIAGNOSIS — R945 Abnormal results of liver function studies: Secondary | ICD-10-CM | POA: Diagnosis not present

## 2019-10-27 DIAGNOSIS — E538 Deficiency of other specified B group vitamins: Secondary | ICD-10-CM | POA: Diagnosis not present

## 2019-10-27 DIAGNOSIS — E871 Hypo-osmolality and hyponatremia: Secondary | ICD-10-CM | POA: Diagnosis not present

## 2019-10-27 DIAGNOSIS — D649 Anemia, unspecified: Secondary | ICD-10-CM | POA: Diagnosis not present

## 2019-10-27 DIAGNOSIS — E876 Hypokalemia: Secondary | ICD-10-CM | POA: Diagnosis not present

## 2019-10-27 DIAGNOSIS — Z5189 Encounter for other specified aftercare: Secondary | ICD-10-CM | POA: Diagnosis not present

## 2019-10-27 DIAGNOSIS — R7989 Other specified abnormal findings of blood chemistry: Secondary | ICD-10-CM

## 2019-10-27 LAB — COMPREHENSIVE METABOLIC PANEL
ALT: 78 U/L — ABNORMAL HIGH (ref 0–44)
AST: 19 U/L (ref 15–41)
Albumin: 3.8 g/dL (ref 3.5–5.0)
Alkaline Phosphatase: 101 U/L (ref 38–126)
Anion gap: 9 (ref 5–15)
BUN: 19 mg/dL (ref 8–23)
CO2: 24 mmol/L (ref 22–32)
Calcium: 10.3 mg/dL (ref 8.9–10.3)
Chloride: 96 mmol/L — ABNORMAL LOW (ref 98–111)
Creatinine, Ser: 0.83 mg/dL (ref 0.44–1.00)
GFR calc Af Amer: >60 mL/min (ref >60)
GFR calc non Af Amer: >60 mL/min (ref >60)
Glucose, Bld: 125 mg/dL — ABNORMAL HIGH (ref 70–99)
Potassium: 3.7 mmol/L (ref 3.5–5.1)
Sodium: 129 mmol/L — ABNORMAL LOW (ref 135–145)
Total Bilirubin: 0.6 mg/dL (ref 0.3–1.2)
Total Protein: 6.7 g/dL (ref 6.5–8.1)

## 2019-10-27 LAB — CBC WITH DIFFERENTIAL/PLATELET
Abs Immature Granulocytes: 0.01 10*3/uL (ref 0.00–0.07)
Basophils Absolute: 0 10*3/uL (ref 0.0–0.1)
Basophils Relative: 0 %
Eosinophils Absolute: 0 10*3/uL (ref 0.0–0.5)
Eosinophils Relative: 1 %
HCT: 38.8 % (ref 36.0–46.0)
Hemoglobin: 13.1 g/dL (ref 12.0–15.0)
Immature Granulocytes: 0 %
Lymphocytes Relative: 34 %
Lymphs Abs: 1.4 10*3/uL (ref 0.7–4.0)
MCH: 29.8 pg (ref 26.0–34.0)
MCHC: 33.8 g/dL (ref 30.0–36.0)
MCV: 88.2 fL (ref 80.0–100.0)
Monocytes Absolute: 0.5 10*3/uL (ref 0.1–1.0)
Monocytes Relative: 13 %
Neutro Abs: 2.1 10*3/uL (ref 1.7–7.7)
Neutrophils Relative %: 52 %
Platelets: 202 10*3/uL (ref 150–400)
RBC: 4.4 MIL/uL (ref 3.87–5.11)
RDW: 16.7 % — ABNORMAL HIGH (ref 11.5–15.5)
WBC: 4 10*3/uL (ref 4.0–10.5)
nRBC: 0 % (ref 0.0–0.2)

## 2019-10-27 MED ORDER — SODIUM CHLORIDE 0.9% FLUSH
10.0000 mL | Freq: Once | INTRAVENOUS | Status: AC
  Administered 2019-10-27: 10 mL via INTRAVENOUS
  Filled 2019-10-27: qty 10

## 2019-10-27 MED ORDER — SODIUM CHLORIDE 0.9 % IV SOLN
Freq: Once | INTRAVENOUS | Status: AC
  Filled 2019-10-27: qty 250

## 2019-10-27 MED ORDER — SODIUM CHLORIDE 0.9 % IV SOLN
150.0000 mg | Freq: Once | INTRAVENOUS | Status: AC
  Administered 2019-10-27: 150 mg via INTRAVENOUS
  Filled 2019-10-27: qty 5

## 2019-10-27 MED ORDER — PALONOSETRON HCL INJECTION 0.25 MG/5ML
0.2500 mg | Freq: Once | INTRAVENOUS | Status: AC
  Administered 2019-10-27: 0.25 mg via INTRAVENOUS
  Filled 2019-10-27: qty 5

## 2019-10-27 MED ORDER — DIPHENHYDRAMINE HCL 50 MG/ML IJ SOLN
50.0000 mg | Freq: Once | INTRAMUSCULAR | Status: AC
  Administered 2019-10-27: 50 mg via INTRAVENOUS
  Filled 2019-10-27: qty 1

## 2019-10-27 MED ORDER — SODIUM CHLORIDE 0.9 % IV SOLN
Freq: Once | INTRAVENOUS | Status: DC
  Filled 2019-10-27: qty 250

## 2019-10-27 MED ORDER — SODIUM CHLORIDE 0.9 % IV SOLN
354.8000 mg | Freq: Once | INTRAVENOUS | Status: AC
  Administered 2019-10-27: 350 mg via INTRAVENOUS
  Filled 2019-10-27: qty 35

## 2019-10-27 MED ORDER — SODIUM CHLORIDE 0.9 % IV SOLN
10.0000 mg | Freq: Once | INTRAVENOUS | Status: AC
  Administered 2019-10-27: 10 mg via INTRAVENOUS
  Filled 2019-10-27: qty 10

## 2019-10-27 MED ORDER — PEGFILGRASTIM 6 MG/0.6ML ~~LOC~~ PSKT
6.0000 mg | PREFILLED_SYRINGE | Freq: Once | SUBCUTANEOUS | Status: AC
  Administered 2019-10-27: 6 mg via SUBCUTANEOUS
  Filled 2019-10-27: qty 0.6

## 2019-10-27 MED ORDER — FAMOTIDINE IN NACL 20-0.9 MG/50ML-% IV SOLN
20.0000 mg | Freq: Once | INTRAVENOUS | Status: AC
  Administered 2019-10-27: 20 mg via INTRAVENOUS
  Filled 2019-10-27: qty 50

## 2019-10-27 MED ORDER — HEPARIN SOD (PORK) LOCK FLUSH 100 UNIT/ML IV SOLN
500.0000 [IU] | Freq: Once | INTRAVENOUS | Status: AC | PRN
  Administered 2019-10-27: 500 [IU]
  Filled 2019-10-27: qty 5

## 2019-10-27 MED ORDER — HEPARIN SOD (PORK) LOCK FLUSH 100 UNIT/ML IV SOLN
500.0000 [IU] | Freq: Once | INTRAVENOUS | Status: AC
  Administered 2019-10-27: 500 [IU] via INTRAVENOUS
  Filled 2019-10-27: qty 5

## 2019-10-27 MED ORDER — POTASSIUM CHLORIDE IN NACL 20-0.9 MEQ/L-% IV SOLN
Freq: Once | INTRAVENOUS | Status: AC
  Filled 2019-10-27: qty 1000

## 2019-10-27 MED ORDER — SODIUM CHLORIDE 0.9 % IV SOLN
510.0000 mg | Freq: Once | INTRAVENOUS | Status: AC
  Administered 2019-10-27: 510 mg via INTRAVENOUS
  Filled 2019-10-27: qty 510

## 2019-10-27 MED ORDER — CYANOCOBALAMIN 1000 MCG/ML IJ SOLN
1000.0000 ug | INTRAMUSCULAR | Status: DC
  Administered 2019-10-27: 1000 ug via INTRAMUSCULAR
  Filled 2019-10-27: qty 1

## 2019-10-27 MED ORDER — SODIUM CHLORIDE 0.9 % IV SOLN
175.0000 mg/m2 | Freq: Once | INTRAVENOUS | Status: AC
  Administered 2019-10-27: 336 mg via INTRAVENOUS
  Filled 2019-10-27: qty 56

## 2019-10-29 ENCOUNTER — Encounter: Payer: Self-pay | Admitting: Nurse Practitioner

## 2019-10-29 NOTE — Progress Notes (Signed)
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 This was followed by an MR pelvis with and without contrast in December 2020 which showed a large cystic mass arising from the right ovary measuring 1.8 cm. This was followed by TAH/BSO with peritoneal biopsies with pelvic/aortic lymph node dissection pelvic washings and omentectomy with Dr. Fransisca Connors and Dr. Georgianne Fick on 08/19/2019. 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 Angiolymphatic invasion is present. Bilateral fallopian tubes were negative for atypia and malignancy. Peritoneal stone excision negative for malignancy. Uterus with cervix negative for atypia and malignancy. Bladder, right gutter negative for malignancy. 5 lymph nodes were sampled and were negative for malignancy. Pelvic washings were also negative for malignancy. Pathologic staging FIGO1 C2  Patient was seen by Dr. Fransisca Connors postoperatively and recommendation was to proceed with adjuvant chemotherapy carbotaxol for 6 cycles.   Interval history-patient reports feeling better after receiving weekly IV fluids.  Restless leg syndrome symptoms are better with Requip.  Diarrhea has resolved.   She denies any tingling numbness in her extremities ECOG PS- 1 Pain scale- 0 Opioid associated constipation- no  Review of systems- Review of Systems  Constitutional: Positive for malaise/fatigue. Negative for chills, fever and weight loss.  HENT: Negative for congestion, ear discharge and nosebleeds.   Eyes: Negative for blurred vision.  Respiratory: Negative for cough, hemoptysis, sputum production, shortness of breath and wheezing.   Cardiovascular: Negative for chest pain, palpitations, orthopnea and claudication.  Gastrointestinal: Negative for abdominal pain, blood in stool, constipation, diarrhea, heartburn, melena, nausea and vomiting.  Genitourinary: Negative for dysuria, flank pain, frequency, hematuria and urgency.  Musculoskeletal: Negative for back pain, joint pain and myalgias.  Skin: Negative for rash.  Neurological: Negative for dizziness, tingling, focal weakness, seizures, weakness and headaches.  Endo/Heme/Allergies: Does not bruise/bleed easily.  Psychiatric/Behavioral: Negative for depression and suicidal ideas. The patient does not have insomnia.        Allergies  Allergen Reactions  . Citalopram Nausea And Vomiting  . Ivp Dye [Iodinated Diagnostic Agents] Hives and Swelling    hives  . Meloxicam     unknown  . Adhesive [Tape] Rash  . Amlodipine Rash  . Ampicillin Rash    Did it involve swelling of the face/tongue/throat, SOB, or low BP? No Did it involve sudden or severe rash/hives, skin peeling, or any reaction on the inside of your mouth or nose? Yes Did you need to seek medical attention at a hospital or doctor's office? Yes When did it last happen? 10 + years If all above answers are "NO", may proceed with cephalosporin use.  . Cefoxitin Rash  . Estradiol Rash  . Estropipate Rash  . Olmesartan Medoxomil-Hctz Rash  . Sertraline Rash       . Sulfa Antibiotics Rash     Past Medical History:  Diagnosis Date  . Anxiety   . Arthritis   .  Family history of breast cancer   . Family history of leukemia   . GERD (gastroesophageal reflux disease)   . History of ischemic colitis 09/02/2017  . Hx of colonic polyp   . Hypertension    H/O-PT TAKEN OFF BY PCP AS OF 2019  . IBS (irritable bowel syndrome)   . Ovarian cancer ([place])   . Ovarian cyst   . Vaginal atrophy      Past Surgical History:  Procedure Laterality Date  . BREAST BIOPSY Left    needle bx/clip-neg  . CERVICAL BIOPSY  W/ LOOP ELECTRODE EXCISION    . CHOLECYSTECTOMY    . Colonoscopoy  2007, 2008, 2011  . COLONOSCOPY WITH PROPOFOL N/A 05/05/2018   Procedure: COLONOSCOPY WITH PROPOFOL;  Surgeon: Lin Landsman, MD;  Location: [facility] ENDOSCOPY;  Service: Gastroenterology;  Laterality: N/A;  . HYSTERECTOMY ABDOMINAL WITH SALPINGO-OOPHORECTOMY N/A 08/19/2019   Procedure: HYSTERECTOMY ABDOMINAL WITH SALPINGO-OOPHORECTOMY WITH PELVIC WASHINGS PERITONEAL BIOPSIES AND PARA AORTIC LYMPH NODE DISSECTION;  Surgeon: [name], MD;  Location: [facility] ORS;  Service: Gynecology;  Laterality: N/A;  . LEEP    . OMENTECTOMY N/A 08/19/2019   Procedure: OMENTECTOMY;  Surgeon: [name], MD;  Location: [facility] ORS;  Service: Gynecology;  Laterality: N/A;  . PORTA CATH INSERTION N/A 09/11/2019   Procedure: PORTA CATH INSERTION;  Surgeon: Algernon Huxley, MD;  Location: [place];  Service: Cardiovascular;  Laterality: N/A;  . TUBAL LIGATION  1999    Social History   Socioeconomic History  . Marital status: Single    Spouse name: Not on file  . Number of children: 0  . Years of education: Not on file  . Highest education level: Associate degree: academic program  Occupational History  . Occupation: Retired  Tobacco Use  . Smoking status: Never Smoker  . Smokeless tobacco: Never Used  . Tobacco comment: smoking cessation materials not required  Substance and Sexual Activity  . Alcohol use: Yes    Comment: rarely  . Drug use: No  . Sexual activity: Never      Birth control/protection: Post-menopausal  Other Topics Concern  . Not on file  Social History Narrative  . Not on file   Social Determinants of Health   Financial Resource Strain:   . Difficulty of Paying Living Expenses:   Food Insecurity:   . Worried About Charity fundraiser in the Last Year:   . Arboriculturist in the Last Year:   Transportation Needs:   . Film/video editor (Medical):   Marland Kitchen Lack of Transportation (Non-Medical):   Physical Activity:   . Days of Exercise per Week:   . Minutes of Exercise per Session:   Stress:   . Feeling of Stress :   Social Connections:   . Frequency of Communication with Friends and Family:   . Frequency of Social Gatherings with Friends and Family:   . Attends Religious Services:   . Active Member of Clubs or Organizations:   . Attends Archivist Meetings:   Marland Kitchen Marital Status:   Intimate Partner Violence:   . Fear of Current or Ex-Partner:   . Emotionally Abused:   Marland Kitchen Physically Abused:   . Sexually Abused:     Family History  Problem Relation Age of Onset  . Heart failure Mother   . Atrial fibrillation Mother   . Hypertension Mother   . Diabetes Father   .  Heart attack Father   . Breast cancer Maternal Aunt 70  . Breast cancer Maternal Grandmother 80  . Breast cancer Cousin        2 mat cousins  . Hypertension Sister   . Hypertension Sister   . Arthritis Sister   . Leukemia Paternal Aunt   . Leukemia Paternal Uncle   . Brain cancer Paternal Aunt   . Other Brother 13       Drowning accident  . Liver cancer Maternal Uncle   . Alcohol abuse Maternal Uncle   . Prostate cancer Neg Hx   . Kidney cancer Neg Hx      Current Outpatient Medications:  .  acetaminophen (TYLENOL) 500 MG tablet, Take 1,000 mg by mouth every 6 (six) hours as needed (for pain.)., Disp: , Rfl:  .  amitriptyline (ELAVIL) 25 MG tablet, Take 2 tablets (50 mg total) by mouth at bedtime., Disp: 90 tablet, Rfl: 0 .   benzocaine-resorcinol (VAGISIL) 5-2 % vaginal cream, Place 1 application vaginally daily as needed for itching or irritation., Disp: , Rfl:  .  dexamethasone (DECADRON) 4 MG tablet, Take 2 tablets (8 mg total) by mouth daily. Start the day after carboplatin chemotherapy for 3 days., Disp: 30 tablet, Rfl: 1 .  diphenhydrAMINE (BENADRYL) 25 MG tablet, Take 25 mg by mouth daily as needed for itching or allergies., Disp: , Rfl:  .  hydrochlorothiazide (HYDRODIURIL) 25 MG tablet, TAKE 1 TABLET BY MOUTH EVERY DAY, Disp: 30 tablet, Rfl: 0 .  hydrocortisone cream 1 %, Apply 1 application topically daily as needed for itching., Disp: , Rfl:  .  lidocaine-prilocaine (EMLA) cream, Apply to affected area once (Patient taking differently: Apply 1 application topically daily as needed (prior to port being accessed.). ), Disp: 30 g, Rfl: 3 .  LORazepam (ATIVAN) 0.5 MG tablet, Take 1 tablet (0.5 mg total) by mouth every 6 (six) hours as needed (Nausea/vomiting or anxiety)., Disp: 90 tablet, Rfl: 0 .  magnesium chloride (SLOW-MAG) 64 MG TBEC SR tablet, Take 1 tablet (64 mg total) by mouth daily., Disp: 60 tablet, Rfl: 0 .  omeprazole (PRILOSEC) 20 MG capsule, Take 20 mg by mouth daily as needed (acid reflux)., Disp: , Rfl:  .  polyethylene glycol (MIRALAX / GLYCOLAX) 17 g packet, Take 17 g by mouth 2 (two) times daily., Disp: , Rfl:  .  potassium chloride SA (KLOR-CON) 10 MEQ tablet, Take 1 tablet (10 mEq total) by mouth 2 (two) times daily., Disp: 60 tablet, Rfl: 0 .  prochlorperazine (COMPAZINE) 10 MG tablet, Take 1 tablet (10 mg total) by mouth every 6 (six) hours as needed (Nausea or vomiting)., Disp: 30 tablet, Rfl: 1 .  psyllium (METAMUCIL) 0.52 g capsule, Take 0.52 g by mouth in the morning and at bedtime., Disp: , Rfl:  .  rOPINIRole (REQUIP) 0.5 MG tablet, Take 1 tablet (0.5 mg total) by mouth at bedtime for 7 days, THEN 2 tablets (1 mg total) at bedtime for 7 days., Disp: 21 tablet, Rfl: 0 .  senna (SENOKOT)  8.6 MG tablet, Take 1 tablet by mouth daily as needed. , Disp: , Rfl:  .  bacitracin 500 UNIT/GM ointment, Apply 1 application topically 2 (two) times daily. Apply to affected area twice a day as directed. (Patient not taking: Reported on 10/27/2019), Disp: 15 g, Rfl: 1 .  cetirizine (ZYRTEC) 10 MG tablet, Take 10 mg by mouth daily as needed (allergies.). , Disp: , Rfl:  .  fluconazole (DIFLUCAN) 150 MG  tablet, Take 1 tablet (150 mg total) by mouth daily. Take 1 tablet (150 mg) by mouth. Three days later, take second tablet. (Patient not taking: Reported on 10/27/2019), Disp: 2 tablet, Rfl: 1 .  fluticasone (FLONASE) 50 MCG/ACT nasal spray, Place 2 sprays into both nostrils daily. (Patient not taking: Reported on 10/13/2019), Disp: 16 g, Rfl: 0 .  guaiFENesin (MUCINEX) 600 MG 12 hr tablet, Take 600 mg by mouth at bedtime as needed (congestion)., Disp: , Rfl:  .  ondansetron (ZOFRAN) 8 MG tablet, Take 1 tablet (8 mg total) by mouth 2 (two) times daily as needed for refractory nausea / vomiting. Start on day 3 after carboplatin chemo. (Patient not taking: Reported on 10/27/2019), Disp: 30 tablet, Rfl: 1 .  oxyCODONE-acetaminophen (PERCOCET/ROXICET) 5-325 MG tablet, Take 1-2 tablets by mouth every 4 (four) hours as needed for moderate pain. (Patient not taking: Reported on 09/23/2019), Disp: 40 tablet, Rfl: 0 .  triamcinolone cream (KENALOG) 0.1 %, Apply 1 application topically 2 (two) times daily. Apply to affected area twice a day as directed. (Patient not taking: Reported on 10/27/2019), Disp: 80 g, Rfl: 0  Physical exam:  Vitals:   10/27/19 0847  BP: 124/81  Pulse: 95  Resp: 18  Temp: (!) 97.4 F (36.3 C)  TempSrc: Tympanic  SpO2: 99%  Weight: 178 lb 14.4 oz (81.1 kg)   Physical Exam Constitutional:      General: She is not in acute distress. HENT:     Head: Normocephalic and atraumatic.  Eyes:     Pupils: Pupils are equal, round, and reactive to light.  Cardiovascular:     Rate and Rhythm:  Normal rate and regular rhythm.     Heart sounds: Normal heart sounds.  Pulmonary:     Effort: Pulmonary effort is normal.     Breath sounds: Normal breath sounds.  Abdominal:     General: Bowel sounds are normal.     Palpations: Abdomen is soft.  Musculoskeletal:     Cervical back: Normal range of motion.  Skin:    General: Skin is warm and dry.  Neurological:     Mental Status: She is alert and oriented to person, place, and time.      CMP Latest Ref Rng & Units 10/27/2019  Glucose 70 - 99 mg/dL 125(H)  BUN 8 - 23 mg/dL 19  Creatinine 0.44 - 1.00 mg/dL 0.83  Sodium 135 - 145 mmol/L 129(L)  Potassium 3.5 - 5.1 mmol/L 3.7  Chloride 98 - 111 mmol/L 96(L)  CO2 22 - 32 mmol/L 24  Calcium 8.9 - 10.3 mg/dL 10.3  Total Protein 6.5 - 8.1 g/dL 6.7  Total Bilirubin 0.3 - 1.2 mg/dL 0.6  Alkaline Phos 38 - 126 U/L 101  AST 15 - 41 U/L 19  ALT 0 - 44 U/L 78(H)   CBC Latest Ref Rng & Units 10/27/2019  WBC 4.0 - 10.5 K/uL 4.0  Hemoglobin 12.0 - 15.0 g/dL 13.1  Hematocrit 36.0 - 46.0 % 38.8  Platelets 150 - 400 K/uL 202    No images are attached to the encounter.  CT ABDOMEN WO CONTRAST  Result Date: 09/29/2019 CLINICAL DATA:  Ovarian cancer restaging. Lower abdominal pain. EXAM: CT CHEST AND ABDOMEN WITHOUT CONTRAST TECHNIQUE: Multidetector CT imaging of the chest and abdomen was performed following the standard protocol without IV contrast. COMPARISON:  None. FINDINGS: CT CHEST FINDINGS Cardiovascular: Port-A-Cath tip: Lower SVC. Atherosclerotic calcification of the thoracic aorta and branch vessels. Mediastinum/Nodes: Contrast medium in the  distal esophagus suggesting reflux or dysmotility. Oval-shaped structure posterior to the upper thoracic esophagus, probably a lymph node, 0.7 cm in short axis on image 8/2. Nodularity along the inferior margin of the thyroid isthmus, contiguous with the thyroid gland, measuring 2.1 by 1.9 by 1.9 cm. Anterior mediastinal nodule with roughly similar  density to the thyroid gland but separate from the thyroid, measuring 2.5 by 1.5 by 3.4 cm, with punctate calcifications posteriorly. Right hilar node 1.0 cm in short axis on image 73/4. Lungs/Pleura: Biapical pleuroparenchymal scarring. 0.4 by 0.2 cm nodule in the left lower lobe on image 88/3 mild airway plugging in the left upper lobe on image 43/3. 2 mm subpleural nodule along the left major fissure inferiorly on image 124/3. Linear subsegmental atelectasis or scarring peripherally in the right upper lobe on image 51/3. Musculoskeletal: Incidental Schmorl's nodes in the midthoracic spine. CT ABDOMEN PELVIS FINDINGS Hepatobiliary: Cholecystectomy. Otherwise unremarkable. Pancreas: Unremarkable Spleen: Unremarkable Adrenals/Urinary Tract: Unremarkable Stomach/Bowel: Unremarkable Vascular/Lymphatic: Aortoiliac atherosclerotic vascular disease. No pathologic adenopathy in the upper abdomen. Other: Prior laparotomy with some faint stranding along the anterior omentum in the immediate vicinity of the laparotomy site. Musculoskeletal: Multilevel lumbar degenerative disc disease. IMPRESSION: 1. Suspected thyroid nodule along the inferior isthmus margin of the thyroid gland along with a separate anterior mediastinal fairly high density mass with punctate calcifications. 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 Thyroid scintigraphy should be considered to further characterize these lesions; if negative on scintigraphy then further workup to exclude the possibility of lymphoma or thymic tumor would be suggested. 2. Contrast medium in the distal esophagus suggesting reflux or dysmotility. 3. Several tiny pulmonary nodules are present, 3 mm in average diameter or less. No follow-up needed if patient is low-risk (and has no known or suspected primary neoplasm). Non-contrast chest CT can be considered in 12 months if  patient is high-risk. This recommendation follows the consensus statement: Guidelines for Management of Incidental Pulmonary Nodules Detected on CT Images: From the Fleischner Society 2017; Radiology 2017; 284:228-243. 4. Aortoiliac atherosclerotic vascular disease. 5. Multilevel lumbar degenerative disc disease. 6. Prior laparotomy with some faint stranding along the anterior omentum in the immediate vicinity of the laparotomy site, likely from benign scarring. Aortic Atherosclerosis (ICD10-I70.0). Electronically Signed   By: Van Clines M.D.   On: 09/29/2019 13:08   CT CHEST WO CONTRAST  Result Date: 09/29/2019 CLINICAL DATA:  Ovarian cancer restaging. Lower abdominal pain. EXAM: CT CHEST AND ABDOMEN WITHOUT CONTRAST TECHNIQUE: Multidetector CT imaging of the chest and abdomen was performed following the standard protocol without IV contrast. COMPARISON:  None. FINDINGS: CT CHEST FINDINGS Cardiovascular: Port-A-Cath tip: Lower SVC. Atherosclerotic calcification of the thoracic aorta and branch vessels. Mediastinum/Nodes: Contrast medium in the distal esophagus suggesting reflux or dysmotility. Oval-shaped structure posterior to the upper thoracic esophagus, probably a lymph node, 0.7 cm in short axis on image 8/2. Nodularity along the inferior margin of the thyroid isthmus, contiguous with the thyroid gland, measuring 2.1 by 1.9 by 1.9 cm. Anterior mediastinal nodule with roughly similar density to the thyroid gland but separate from the thyroid, measuring 2.5 by 1.5 by 3.4 cm, with punctate calcifications posteriorly. Right hilar node 1.0 cm in short axis on image 73/4. Lungs/Pleura: Biapical pleuroparenchymal scarring. 0.4 by 0.2 cm nodule in the left lower lobe on image 88/3 mild airway plugging in the left upper lobe on image 43/3. 2 mm subpleural nodule along the left major  fissure inferiorly on image 124/3. Linear subsegmental atelectasis or scarring peripherally in the right upper lobe on image  51/3. Musculoskeletal: Incidental Schmorl's nodes in the midthoracic spine. CT ABDOMEN PELVIS FINDINGS Hepatobiliary: Cholecystectomy. Otherwise unremarkable. Pancreas: Unremarkable Spleen: Unremarkable Adrenals/Urinary Tract: Unremarkable Stomach/Bowel: Unremarkable Vascular/Lymphatic: Aortoiliac atherosclerotic vascular disease. No pathologic adenopathy in the upper abdomen. Other: Prior laparotomy with some faint stranding along the anterior omentum in the immediate vicinity of the laparotomy site. Musculoskeletal: Multilevel lumbar degenerative disc disease. IMPRESSION: 1. Suspected thyroid nodule along the inferior isthmus margin of the thyroid gland along with a separate anterior mediastinal fairly high density mass with punctate calcifications. 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 Thyroid scintigraphy should be considered to further characterize these lesions; if negative on scintigraphy then further workup to exclude the possibility of lymphoma or thymic tumor would be suggested. 2. Contrast medium in the distal esophagus suggesting reflux or dysmotility. 3. Several tiny pulmonary nodules are present, 3 mm in average diameter or less. No follow-up needed if patient is low-risk (and has no known or suspected primary neoplasm). Non-contrast chest CT can be considered in 12 months if patient is high-risk. This recommendation follows the consensus statement: Guidelines for Management of Incidental Pulmonary Nodules Detected on CT Images: From the Fleischner Society 2017; Radiology 2017; 284:228-243. 4. Aortoiliac atherosclerotic vascular disease. 5. Multilevel lumbar degenerative disc disease. 6. Prior laparotomy with some faint stranding along the anterior omentum in the immediate vicinity of the laparotomy site, likely from benign scarring. Aortic Atherosclerosis (ICD10-I70.0). Electronically Signed    By: Van Clines M.D.   On: 09/29/2019 13:08     Assessment and plan- Patient is a 74 y.o. 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  She is here for on treatment assessment prior to cycle 3 of adjuvant carbotaxol chemotherapy  Counts okay to proceed with cycle 3 of carbotaxol chemotherapy today with on for Neulasta support.  White cell count hemoglobin and platelets have remained stable.  I will see her back in 3 weeks time for cycle 4.  Restless leg syndrome: Continue Requip.  She also will receive her second dose of Feraheme today  Abnormal LFTs: Significantly improved and her ALT is mildly elevated at 78 which we will continue to monitor  Dehydration: She continues to have hyponatremia and we will give her 1 L of IV fluids today.  She will return on 10/30/2019 to receive IV fluids.  She will see NP Beckey Rutter on 11/03/2019 with labs CBC with differential, BMP and possible fluids  Labs CMP in 2 weeks for possible fluids  B12 deficiency: We will also receive B12 shot today  Hypokalemia: She has been hypokalemic in the recent past and will receive IV potassium today as well  Hyponatremia: Normal saline as above   Visit Diagnosis 1. Encounter for antineoplastic chemotherapy   2. Hypokalemia   3. Dehydration   4. Abnormal LFTs   5. 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HCC)   6. B12 deficiency      Dr. Randa Evens, MD, MPH [facility] [id number] 10/29/2019 8:30 AM

## 2019-10-30 ENCOUNTER — Inpatient Hospital Stay: Payer: Medicare Other

## 2019-10-30 ENCOUNTER — Inpatient Hospital Stay (HOSPITAL_BASED_OUTPATIENT_CLINIC_OR_DEPARTMENT_OTHER): Payer: Medicare Other | Admitting: Nurse Practitioner

## 2019-10-30 ENCOUNTER — Telehealth: Payer: Self-pay | Admitting: Oncology

## 2019-10-30 ENCOUNTER — Other Ambulatory Visit: Payer: Self-pay

## 2019-10-30 ENCOUNTER — Other Ambulatory Visit: Payer: Self-pay | Admitting: Nurse Practitioner

## 2019-10-30 DIAGNOSIS — D649 Anemia, unspecified: Secondary | ICD-10-CM | POA: Diagnosis not present

## 2019-10-30 DIAGNOSIS — Z5111 Encounter for antineoplastic chemotherapy: Secondary | ICD-10-CM | POA: Diagnosis not present

## 2019-10-30 DIAGNOSIS — K5900 Constipation, unspecified: Secondary | ICD-10-CM | POA: Diagnosis not present

## 2019-10-30 DIAGNOSIS — E871 Hypo-osmolality and hyponatremia: Secondary | ICD-10-CM | POA: Diagnosis not present

## 2019-10-30 DIAGNOSIS — C561 Malignant neoplasm of right ovary: Secondary | ICD-10-CM | POA: Diagnosis not present

## 2019-10-30 DIAGNOSIS — T451X5A Adverse effect of antineoplastic and immunosuppressive drugs, initial encounter: Secondary | ICD-10-CM

## 2019-10-30 DIAGNOSIS — K521 Toxic gastroenteritis and colitis: Secondary | ICD-10-CM

## 2019-10-30 DIAGNOSIS — Z95828 Presence of other vascular implants and grafts: Secondary | ICD-10-CM

## 2019-10-30 DIAGNOSIS — Z5189 Encounter for other specified aftercare: Secondary | ICD-10-CM | POA: Diagnosis not present

## 2019-10-30 DIAGNOSIS — D701 Agranulocytosis secondary to cancer chemotherapy: Secondary | ICD-10-CM | POA: Diagnosis not present

## 2019-10-30 DIAGNOSIS — E860 Dehydration: Secondary | ICD-10-CM

## 2019-10-30 MED ORDER — HEPARIN SOD (PORK) LOCK FLUSH 100 UNIT/ML IV SOLN
500.0000 [IU] | Freq: Once | INTRAVENOUS | Status: AC
  Administered 2019-10-30: 500 [IU] via INTRAVENOUS
  Filled 2019-10-30: qty 5

## 2019-10-30 MED ORDER — SODIUM CHLORIDE 0.9% FLUSH
10.0000 mL | INTRAVENOUS | Status: DC | PRN
  Administered 2019-10-30: 14:00:00 10 mL via INTRAVENOUS
  Filled 2019-10-30: qty 10

## 2019-10-30 MED ORDER — SODIUM CHLORIDE 0.9 % IV SOLN
Freq: Once | INTRAVENOUS | Status: AC
  Filled 2019-10-30: qty 250

## 2019-10-30 NOTE — Telephone Encounter (Signed)
Patient phoned this AM and left voicemail stating that she would not be able to attend her lab and infusion today and that instead, she would like to speak with the NP, Beckey Rutter. Appts for today have been cancelled per patient's request and message has been sent to Beckey Rutter that patient would like to speak with her.

## 2019-10-31 ENCOUNTER — Other Ambulatory Visit: Payer: Self-pay | Admitting: Obstetrics and Gynecology

## 2019-11-02 ENCOUNTER — Encounter: Payer: Self-pay | Admitting: Genetic Counselor

## 2019-11-02 ENCOUNTER — Ambulatory Visit: Payer: Self-pay | Admitting: Genetic Counselor

## 2019-11-02 ENCOUNTER — Telehealth: Payer: Self-pay | Admitting: Genetic Counselor

## 2019-11-02 DIAGNOSIS — Z1379 Encounter for other screening for genetic and chromosomal anomalies: Secondary | ICD-10-CM

## 2019-11-02 NOTE — Telephone Encounter (Signed)
Revealed negative genetic testing.  Discussed that we do not know why she has ovarian cancer or why there is cancer in the family. It could be due to a different gene that we are not testing, or maybe our current technology may not be able to pick something up.  It will be important for her to keep in contact with genetics to keep up with whether additional testing may be needed.  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  This will not change medical management.  HRD is negative.  This is suggestive that her tumor is not more sensitive to PARP inhibitor.

## 2019-11-02 NOTE — Progress Notes (Signed)
HPI:  Ms. Milone was previously seen in the [place] clinic due to a personal and family history of cancer and concerns regarding a hereditary predisposition to cancer. Please refer to our prior cancer genetics clinic note for more information regarding our discussion, assessment and recommendations, at the time. Ms. Krogh recent genetic test results were disclosed to her, as were recommendations warranted by these results. These results and recommendations are discussed in more detail below.  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[place])   09/07/2019 Cancer Staging   Staging form: Ovary, Fallopian Tube, and Primary Peritoneal Carcinoma, AJCC 8th Edition - Pathologic stage from 09/07/2019: FIGO Stage IC2, calculated as Stage IC (pT1c2, pN0, cM0) - Signed by Sindy Guadeloupe, MD on 09/07/2019   09/15/2019 -  Chemotherapy   The patient had palonosetron (ALOXI) injection 0.25 mg, 0.25 mg, Intravenous,  Once, 3 of 6 cycles Administration: 0.25 mg (09/15/2019), 0.25 mg (10/06/2019), 0.25 mg (10/27/2019) pegfilgrastim (NEULASTA ONPRO KIT) injection 6 mg, 6 mg, Subcutaneous, Once, 3 of 6 cycles Administration: 6 mg (09/15/2019), 6 mg (10/06/2019), 6 mg (10/27/2019) CARBOplatin (PARAPLATIN) 350 mg in sodium chloride 0.9 % 250 mL chemo infusion, 350 mg (100 % of original dose 354.8 mg), Intravenous,  Once, 3 of 6 cycles Dose modification:   (original dose 354.8 mg, Cycle 1) Administration: 350 mg (09/15/2019), 350 mg (10/06/2019), 350 mg (10/27/2019) fosaprepitant (EMEND) 150 mg in sodium chloride 0.9 % 145 mL IVPB, 150 mg, Intravenous,  Once, 3 of 6 cycles Administration: 150 mg (09/15/2019), 150 mg (10/06/2019), 150 mg (10/27/2019) PACLitaxel (TAXOL) 336 mg in sodium chloride 0.9 % 500 mL chemo infusion (> 49m/m2), 175 mg/m2 = 336 mg, Intravenous,  Once, 3 of 6 cycles Administration: 336 mg (09/15/2019),  336 mg (10/06/2019), 336 mg (10/27/2019)  for chemotherapy treatment.    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  The CancerNext gene panel offered by [employer] sequencing and rearrangement analysis for the following 34 genes:   APC, ATM, BARD1, BMPR1A, BRCA1, BRCA2, BRIP1, CDH1, CDK4, CDKN2A, CHEK2, DICER1, HOXB13, EPCAM, GREM1, MLH1, MRE11A, MSH2, MSH6, MUTYH, NBN, NF1, PALB2, PMS2, POLD1, POLE, PTEN, RAD50, RAD51C, RAD51D, SMAD4, SMARCA4, STK11, and TP53.  The report date is 10/29/2019.  TumorHRD testing was negative.  This suggests that the patient's tumor is not more susceptible to PARP inhibitor.     FAMILY HISTORY:  We obtained a detailed, 4-generation family history.  Significant diagnoses are listed below: Family History  Problem Relation Age of Onset  . Heart failure Mother   . Atrial fibrillation Mother   . Hypertension Mother   . Diabetes Father   . Heart attack Father   . Breast cancer Maternal Aunt 70  . Breast cancer Maternal Grandmother 80  . Breast cancer Cousin        2 mat cousins  . Hypertension Sister   . Hypertension Sister   . Arthritis Sister   . Leukemia Paternal Aunt   . Leukemia Paternal Uncle   . Brain cancer Paternal Aunt   . Other Brother 13       Drowning accident  . Liver cancer Maternal Uncle   . Alcohol abuse Maternal Uncle   . Prostate cancer Neg Hx   . Kidney cancer Neg Hx     The patient does not have children.  She has a  brother and two sisters, none who had cancer.  Her parents are deceased from non-cancer related issues.    The patient's mother had a brother and sister.  Her brother was an alcoholic and died of liver cancer..  He had two daughters who had breast cancer - they both reportedly had genetic testing that was negative.  Her sister had breast cancer.  The patient's maternal grandparents are deceased.  The grandmother had breast cancer.  The patient's father had two  brothers and two sisters.  One brother and one sister had leukemia and the sister had a son with leukemia.  His other sister had a brain cancer.  Ms. Giacobbe is aware of previous family history of genetic testing for hereditary cancer risks. Patient's maternal ancestors are of [place] and [place] descent, and paternal ancestors are of [place] descent. There is no reported Ashkenazi Jewish ancestry. There is no known consanguinity.    GENETIC TEST RESULTS: Genetic testing reported out on October 29, 2019 through the TumorNext HRD+CancerNext cancer panel found no pathogenic mutations. The CancerNext gene panel offered by [employer] includes sequencing and rearrangement analysis for the following 34 genes:   APC, ATM, BARD1, BMPR1A, BRCA1, BRCA2, BRIP1, CDH1, CDK4, CDKN2A, CHEK2, DICER1, HOXB13, EPCAM, GREM1, MLH1, MRE11A, MSH2, MSH6, MUTYH, NBN, NF1, PALB2, PMS2, POLD1, POLE, PTEN, RAD50, RAD51C, RAD51D, SMAD4, SMARCA4, STK11, and TP53. The test report has been scanned into EPIC and is located under the Molecular Pathology section of the Results Review tab. HRD testing was also negative which suggests that her ovarian tumor would not be more sensitive to PARP inhibitor. A portion of the result report is included below for reference.     We discussed with Ms. Rightmyer that because current genetic testing is not perfect, it is possible there may be a gene mutation in one of these genes that current testing cannot detect, but that chance is small.  We also discussed, that there could be another gene that has not yet been discovered, or that we have not yet tested, that is responsible for the cancer diagnoses in the family. It is also possible there is a hereditary cause for the cancer in the family that Ms. Costanza did not inherit and therefore was not identified in her testing.  Therefore, it is important to remain in touch with cancer genetics in the future so that we can continue to offer Ms. Keetch the most up to  date genetic testing.   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  At this time, it is unknown if this variant is associated with increased cancer risk or if this is a normal finding, but most variants such as this get reclassified to being inconsequential. It should not be used to make medical management decisions. With time, we suspect the lab will determine the significance of this variant, if any. If we do learn more about it, we will try to contact Ms. Vultaggio to discuss it further. However, it is important to stay in touch with [place] periodically and keep the address and phone number up to date.  ADDITIONAL GENETIC TESTING: We discussed with Ms. Mccleave that there are other genes that are associated with increased cancer risk that can be analyzed. Should Ms. Maves wish to pursue additional genetic testing, we are happy to discuss and coordinate this testing, at any time.    CANCER SCREENING RECOMMENDATIONS: Ms. Kirschbaum test result is considered negative (normal).  This means that we have not identified a hereditary cause for her personal and family history of cancer at this time. Most cancers happen by chance and this negative test suggests that her cancer may fall into this category.    While reassuring, this does not definitively rule out a hereditary predisposition to cancer. It is still possible that there could be genetic mutations that are undetectable by current technology. There could be genetic mutations in genes that have not been tested or identified to increase cancer risk.  Therefore, it is recommended she continue to follow the cancer management and screening guidelines provided by her oncology and primary healthcare provider.   An individual's cancer risk and medical management are not determined by genetic test results alone. Overall cancer risk assessment incorporates additional factors, including personal  medical history, family history, and any available genetic information that may result in a personalized plan for cancer prevention and surveillance   RECOMMENDATIONS FOR FAMILY MEMBERS:  Individuals in this family might be at some increased risk of developing cancer, over the general population risk, simply due to the family history of cancer.  We recommended women in this family have a yearly mammogram beginning at age 34, or 16 years younger than the earliest onset of cancer, an annual clinical breast exam, and perform monthly breast self-exams. Women in this family should also have a gynecological exam as recommended by their primary provider. All family members should have a colonoscopy by age 24.  It is also possible there is a hereditary cause for the cancer in Ms. Royce's family that she did not inherit and therefore was not identified in her.  Based on Ms. Durrett's family history, we recommended her maternal cousins, who were diagnosed with breast cancer, have genetic counseling and testing.She reports that both cousins have had genetic testing in the past that are reportedly negative. Ms. Vitiello will let us know if we can be of any assistance in coordinating genetic counseling and/or testing for this family member.   FOLLOW-UP: Lastly, we discussed with Ms. Clavel that cancer genetics is a rapidly advancing field and it is possible that new genetic tests will be appropriate for her and/or her family members in the future. We encouraged her to remain in contact with cancer genetics on an annual basis so we can update her personal and family histories and let her know of advances in cancer genetics that may benefit this family.   Our contact number was provided. Ms. Hedstrom questions were answered to her satisfaction, and she knows she is welcome to call us at anytime with additional questions or concerns.   Roma Kayser, [place], [facility] Licensed, Certified Genetic Counselor [email or web address]

## 2019-11-02 NOTE — Telephone Encounter (Signed)
Contacted patient, but it was not a good time to talk about results.  I will try to CB this afternoon.

## 2019-11-03 ENCOUNTER — Other Ambulatory Visit: Payer: Self-pay

## 2019-11-03 ENCOUNTER — Inpatient Hospital Stay: Payer: Medicare Other

## 2019-11-03 ENCOUNTER — Inpatient Hospital Stay (HOSPITAL_BASED_OUTPATIENT_CLINIC_OR_DEPARTMENT_OTHER): Payer: Medicare Other | Admitting: Nurse Practitioner

## 2019-11-03 VITALS — BP 133/71 | HR 89 | Temp 97.5°F | Wt 176.5 lb

## 2019-11-03 DIAGNOSIS — E871 Hypo-osmolality and hyponatremia: Secondary | ICD-10-CM | POA: Diagnosis not present

## 2019-11-03 DIAGNOSIS — C561 Malignant neoplasm of right ovary: Secondary | ICD-10-CM

## 2019-11-03 DIAGNOSIS — M898X9 Other specified disorders of bone, unspecified site: Secondary | ICD-10-CM | POA: Diagnosis not present

## 2019-11-03 DIAGNOSIS — D649 Anemia, unspecified: Secondary | ICD-10-CM | POA: Diagnosis not present

## 2019-11-03 DIAGNOSIS — D701 Agranulocytosis secondary to cancer chemotherapy: Secondary | ICD-10-CM | POA: Diagnosis not present

## 2019-11-03 DIAGNOSIS — E860 Dehydration: Secondary | ICD-10-CM

## 2019-11-03 DIAGNOSIS — Z95828 Presence of other vascular implants and grafts: Secondary | ICD-10-CM

## 2019-11-03 DIAGNOSIS — R198 Other specified symptoms and signs involving the digestive system and abdomen: Secondary | ICD-10-CM | POA: Diagnosis not present

## 2019-11-03 DIAGNOSIS — Z5189 Encounter for other specified aftercare: Secondary | ICD-10-CM | POA: Diagnosis not present

## 2019-11-03 DIAGNOSIS — Z5111 Encounter for antineoplastic chemotherapy: Secondary | ICD-10-CM | POA: Diagnosis not present

## 2019-11-03 LAB — COMPREHENSIVE METABOLIC PANEL
ALT: 30 U/L (ref 0–44)
AST: 16 U/L (ref 15–41)
Albumin: 3.7 g/dL (ref 3.5–5.0)
Alkaline Phosphatase: 108 U/L (ref 38–126)
Anion gap: 11 (ref 5–15)
BUN: 18 mg/dL (ref 8–23)
CO2: 25 mmol/L (ref 22–32)
Calcium: 10.3 mg/dL (ref 8.9–10.3)
Chloride: 93 mmol/L — ABNORMAL LOW (ref 98–111)
Creatinine, Ser: 0.84 mg/dL (ref 0.44–1.00)
GFR calc Af Amer: >60 mL/min (ref >60)
GFR calc non Af Amer: >60 mL/min (ref >60)
Glucose, Bld: 130 mg/dL — ABNORMAL HIGH (ref 70–99)
Potassium: 4 mmol/L (ref 3.5–5.1)
Sodium: 129 mmol/L — ABNORMAL LOW (ref 135–145)
Total Bilirubin: 0.4 mg/dL (ref 0.3–1.2)
Total Protein: 6.2 g/dL — ABNORMAL LOW (ref 6.5–8.1)

## 2019-11-03 LAB — CBC WITH DIFFERENTIAL/PLATELET
Abs Immature Granulocytes: 0.9 10*3/uL — ABNORMAL HIGH (ref 0.00–0.07)
Basophils Absolute: 0 10*3/uL (ref 0.0–0.1)
Basophils Relative: 0 %
Eosinophils Absolute: 0.4 10*3/uL (ref 0.0–0.5)
Eosinophils Relative: 3 %
HCT: 38.6 % (ref 36.0–46.0)
Hemoglobin: 13.3 g/dL (ref 12.0–15.0)
Immature Granulocytes: 6 %
Lymphocytes Relative: 20 %
Lymphs Abs: 3.1 10*3/uL (ref 0.7–4.0)
MCH: 30.3 pg (ref 26.0–34.0)
MCHC: 34.5 g/dL (ref 30.0–36.0)
MCV: 87.9 fL (ref 80.0–100.0)
Monocytes Absolute: 1 10*3/uL (ref 0.1–1.0)
Monocytes Relative: 7 %
Neutro Abs: 10.1 10*3/uL — ABNORMAL HIGH (ref 1.7–7.7)
Neutrophils Relative %: 64 %
Platelets: 205 10*3/uL (ref 150–400)
RBC: 4.39 MIL/uL (ref 3.87–5.11)
RDW: 17.6 % — ABNORMAL HIGH (ref 11.5–15.5)
WBC: 15.6 10*3/uL — ABNORMAL HIGH (ref 4.0–10.5)
nRBC: 0 % (ref 0.0–0.2)

## 2019-11-03 MED ORDER — SODIUM CHLORIDE 0.9 % IV SOLN
Freq: Once | INTRAVENOUS | Status: AC
  Filled 2019-11-03: qty 250

## 2019-11-03 MED ORDER — HEPARIN SOD (PORK) LOCK FLUSH 100 UNIT/ML IV SOLN
500.0000 [IU] | Freq: Once | INTRAVENOUS | Status: AC
  Administered 2019-11-03: 500 [IU] via INTRAVENOUS
  Filled 2019-11-03: qty 5

## 2019-11-03 MED ORDER — SODIUM CHLORIDE 0.9% FLUSH
10.0000 mL | Freq: Once | INTRAVENOUS | Status: AC
  Administered 2019-11-03: 10 mL via INTRAVENOUS
  Filled 2019-11-03: qty 10

## 2019-11-03 MED ORDER — LISINOPRIL 5 MG PO TABS: 5.0000 mg | ORAL_TABLET | Freq: Every day | ORAL | 0 refills | Status: DC

## 2019-11-03 NOTE — Progress Notes (Signed)
Virtual Visit Progress Note  Symptom Management Clinic [facility]  [phone] [phone]  I connected with Tambra Muller on 11/03/19 at  9:30 AM EDT by telephone visit and verified that I am speaking with the correct person using two identifiers.   I discussed the limitations, risks, security and privacy concerns of performing an evaluation and management service by telemedicine and the availability of in-person appointments. I also discussed with the patient that there may be a patient responsible charge related to this service. The patient expressed understanding and agreed to proceed.   Other persons participating in the visit and their role in the encounter: none  Patient's location: home Provider's location: clinic  Chief Complaint: constipation    Patient Care Team: Juline Patch, MD as PCP - General (Family Medicine) Lin Landsman, MD as Consulting Physician (Gastroenterology) Clent Jacks, RN as Oncology Nurse Navigator [name], MD as Consulting Physician (Obstetrics and Gynecology)   Name of the patient: Carrie Roberson  [id number]  09-Jan-1946   Date of visit: 11/03/19  Diagnosis- Ovarian Cancer Chief complaint/ Reason for visit- Constipation  Heme/Onc history:  Oncology History  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[place])  08/19/2019 Initial Diagnosis   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[place])   09/07/2019 Cancer Staging   Staging form: Ovary, Fallopian Tube, and Primary Peritoneal Carcinoma, AJCC 8th Edition - Pathologic stage from 09/07/2019: FIGO Stage IC2, calculated as Stage IC (pT1c2, pN0, cM0) - Signed by Sindy Guadeloupe, MD on 09/07/2019   09/15/2019 -  Chemotherapy   The patient had palonosetron (ALOXI) injection 0.25 mg, 0.25 mg, Intravenous,  Once, 3 of 6 cycles Administration: 0.25 mg (09/15/2019), 0.25 mg (10/06/2019), 0.25 mg (10/27/2019) pegfilgrastim (NEULASTA ONPRO KIT) injection 6 mg, 6 mg,  Subcutaneous, Once, 3 of 6 cycles Administration: 6 mg (09/15/2019), 6 mg (10/06/2019), 6 mg (10/27/2019) CARBOplatin (PARAPLATIN) 350 mg in sodium chloride 0.9 % 250 mL chemo infusion, 350 mg (100 % of original dose 354.8 mg), Intravenous,  Once, 3 of 6 cycles Dose modification:   (original dose 354.8 mg, Cycle 1) Administration: 350 mg (09/15/2019), 350 mg (10/06/2019), 350 mg (10/27/2019) fosaprepitant (EMEND) 150 mg in sodium chloride 0.9 % 145 mL IVPB, 150 mg, Intravenous,  Once, 3 of 6 cycles Administration: 150 mg (09/15/2019), 150 mg (10/06/2019), 150 mg (10/27/2019) PACLitaxel (TAXOL) 336 mg in sodium chloride 0.9 % 500 mL chemo infusion (> 66m/m2), 175 mg/m2 = 336 mg, Intravenous,  Once, 3 of 6 cycles Administration: 336 mg (09/15/2019), 336 mg (10/06/2019), 336 mg (10/27/2019)  for chemotherapy treatment.    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  The CancerNext gene panel offered by [employer] sequencing and rearrangement analysis for the following 34 genes:   APC, ATM, BARD1, BMPR1A, BRCA1, BRCA2, BRIP1, CDH1, CDK4, CDKN2A, CHEK2, DICER1, HOXB13, EPCAM, GREM1, MLH1, MRE11A, MSH2, MSH6, MUTYH, NBN, NF1, PALB2, PMS2, POLD1, POLE, PTEN, RAD50, RAD51C, RAD51D, SMAD4, SMARCA4, STK11, and TP53.  The report date is 10/29/2019.  TumorHRD testing was negative.  This suggests that the patient's tumor is not more susceptible to PARP inhibitor.     Interval history-Tarra NGillentine74year old female diagnosed with ovarian cancer currently receiving carbo-Taxol chemotherapy presents to symptom management clinic with complaints of constipation.  She has a history of IBS with constipation.  Previously she experienced diarrhea secondary to chemotherapy.  Last bowel movement 2 days ago.  Has been taking MiraLAX and senna.  Denies abdominal pain nausea or vomiting.  Concerns of becoming impacted.  Not passing liquid stool.  Feeling at baseline otherwise.  ECOG  FS:1 - Symptomatic but completely ambulatory  Review of systems- Review of Systems  Constitutional: Negative for chills, fever, malaise/fatigue and weight loss.  HENT: Negative for hearing loss, nosebleeds, sore throat and tinnitus.   Eyes: Negative for blurred vision and double vision.  Respiratory: Negative for cough, hemoptysis, shortness of breath and wheezing.   Cardiovascular: Negative for chest pain, palpitations and leg swelling.  Gastrointestinal: Positive for constipation. Negative for abdominal pain, blood in stool, diarrhea, melena, nausea and vomiting.  Genitourinary: Negative for dysuria and urgency.  Musculoskeletal: Negative for back pain, falls, joint pain and myalgias.  Skin: Negative for itching and rash.  Neurological: Negative for dizziness, tingling, sensory change, loss of consciousness, weakness and headaches.  Endo/Heme/Allergies: Negative for environmental allergies. Does not bruise/bleed easily.  Psychiatric/Behavioral: Negative for depression. The patient is not nervous/anxious and does not have insomnia.       Allergies  Allergen Reactions  . Citalopram Nausea And Vomiting  . Ivp Dye [Iodinated Diagnostic Agents] Hives and Swelling    hives  . Meloxicam     unknown  . Adhesive [Tape] Rash  . Amlodipine Rash  . Ampicillin Rash    Did it involve swelling of the face/tongue/throat, SOB, or low BP? No Did it involve sudden or severe rash/hives, skin peeling, or any reaction on the inside of your mouth or nose? Yes Did you need to seek medical attention at a hospital or doctor's office? Yes When did it last happen? 10 + years If all above answers are "NO", may proceed with cephalosporin use.  . Cefoxitin Rash  . Estradiol Rash  . Estropipate Rash  . Olmesartan Medoxomil-Hctz Rash  . Sertraline Rash       . Sulfa Antibiotics Rash    Past Medical History:  Diagnosis Date  . Anxiety   . Arthritis   . Family history of breast cancer   . Family  history of leukemia   . GERD (gastroesophageal reflux disease)   . History of ischemic colitis 09/02/2017  . Hx of colonic polyp   . Hypertension    H/O-PT TAKEN OFF BY PCP AS OF 2019  . IBS (irritable bowel syndrome)   . Ovarian cancer ([place])   . Ovarian cyst   . Vaginal atrophy     Past Surgical History:  Procedure Laterality Date  . BREAST BIOPSY Left    needle bx/clip-neg  . CERVICAL BIOPSY  W/ LOOP ELECTRODE EXCISION    . CHOLECYSTECTOMY    . Colonoscopoy  2007, 2008, 2011  . COLONOSCOPY WITH PROPOFOL N/A 05/05/2018   Procedure: COLONOSCOPY WITH PROPOFOL;  Surgeon: Lin Landsman, MD;  Location[facility] ENDOSCOPY;  Service: Gastroenterology;  Laterality: N/A;  . HYSTERECTOMY ABDOMINAL WITH SALPINGO-OOPHORECTOMY N/A 08/19/2019   Procedure: HYSTERECTOMY ABDOMINAL WITH SALPINGO-OOPHORECTOMY WITH PELVIC WASHINGS PERITONEAL BIOPSIES AND PARA AORTIC LYMPH NODE DISSECTION;  Surgeon: [name], MD;  Location: [facility] ORS;  Service: Gynecology;  Laterality: N/A;  . LEEP    . OMENTECTOMY N/A 08/19/2019   Procedure: OMENTECTOMY;  Surgeon: [name], MD;  Location: [facility] ORS;  Service: Gynecology;  Laterality: N/A;  . PORTA CATH INSERTION N/A 09/11/2019   Procedure: PORTA CATH INSERTION;  Surgeon: Algernon Huxley, MD;  Location: [facility];  Service: Cardiovascular;  Laterality: N/A;  . TUBAL LIGATION  1999    Social History   Socioeconomic  History  . Marital status: Single    Spouse name: Not on file  . Number of children: 0  . Years of education: Not on file  . Highest education level: Associate degree: academic program  Occupational History  . Occupation: Retired  Tobacco Use  . Smoking status: Never Smoker  . Smokeless tobacco: Never Used  . Tobacco comment: smoking cessation materials not required  Substance and Sexual Activity  . Alcohol use: Yes    Comment: rarely  . Drug use: No  . Sexual activity: Never    Birth control/protection: Post-menopausal    Other Topics Concern  . Not on file  Social History Narrative  . Not on file   Social Determinants of Health   Financial Resource Strain:   . Difficulty of Paying Living Expenses:   Food Insecurity:   . Worried About Charity fundraiser in the Last Year:   . Arboriculturist in the Last Year:   Transportation Needs:   . Film/video editor (Medical):   Marland Kitchen Lack of Transportation (Non-Medical):   Physical Activity:   . Days of Exercise per Week:   . Minutes of Exercise per Session:   Stress:   . Feeling of Stress :   Social Connections:   . Frequency of Communication with Friends and Family:   . Frequency of Social Gatherings with Friends and Family:   . Attends Religious Services:   . Active Member of Clubs or Organizations:   . Attends Archivist Meetings:   Marland Kitchen Marital Status:   Intimate Partner Violence:   . Fear of Current or Ex-Partner:   . Emotionally Abused:   Marland Kitchen Physically Abused:   . Sexually Abused:     Family History  Problem Relation Age of Onset  . Heart failure Mother   . Atrial fibrillation Mother   . Hypertension Mother   . Diabetes Father   . Heart attack Father   . Breast cancer Maternal Aunt 70  . Breast cancer Maternal Grandmother 80  . Breast cancer Cousin        2 mat cousins  . Hypertension Sister   . Hypertension Sister   . Arthritis Sister   . Leukemia Paternal Aunt   . Leukemia Paternal Uncle   . Brain cancer Paternal Aunt   . Other Brother 13       Drowning accident  . Liver cancer Maternal Uncle   . Alcohol abuse Maternal Uncle   . Prostate cancer Neg Hx   . Kidney cancer Neg Hx      Current Outpatient Medications:  .  acetaminophen (TYLENOL) 500 MG tablet, Take 1,000 mg by mouth every 6 (six) hours as needed (for pain.)., Disp: , Rfl:  .  amitriptyline (ELAVIL) 25 MG tablet, Take 2 tablets (50 mg total) by mouth at bedtime., Disp: 90 tablet, Rfl: 0 .  bacitracin 500 UNIT/GM ointment, Apply 1 application topically 2  (two) times daily. Apply to affected area twice a day as directed. (Patient not taking: Reported on 10/27/2019), Disp: 15 g, Rfl: 1 .  benzocaine-resorcinol (VAGISIL) 5-2 % vaginal cream, Place 1 application vaginally daily as needed for itching or irritation., Disp: , Rfl:  .  cetirizine (ZYRTEC) 10 MG tablet, Take 10 mg by mouth daily as needed (allergies.). , Disp: , Rfl:  .  dexamethasone (DECADRON) 4 MG tablet, Take 2 tablets (8 mg total) by mouth daily. Start the day after carboplatin chemotherapy for 3 days., Disp: 30 tablet,  Rfl: 1 .  diphenhydrAMINE (BENADRYL) 25 MG tablet, Take 25 mg by mouth daily as needed for itching or allergies., Disp: , Rfl:  .  fluconazole (DIFLUCAN) 150 MG tablet, Take 1 tablet (150 mg total) by mouth daily. Take 1 tablet (150 mg) by mouth. Three days later, take second tablet. (Patient not taking: Reported on 10/27/2019), Disp: 2 tablet, Rfl: 1 .  fluticasone (FLONASE) 50 MCG/ACT nasal spray, Place 2 sprays into both nostrils daily. (Patient not taking: Reported on 10/13/2019), Disp: 16 g, Rfl: 0 .  guaiFENesin (MUCINEX) 600 MG 12 hr tablet, Take 600 mg by mouth at bedtime as needed (congestion)., Disp: , Rfl:  .  hydrocortisone cream 1 %, Apply 1 application topically daily as needed for itching., Disp: , Rfl:  .  lidocaine-prilocaine (EMLA) cream, Apply to affected area once (Patient taking differently: Apply 1 application topically daily as needed (prior to port being accessed.). ), Disp: 30 g, Rfl: 3 .  lisinopril (ZESTRIL) 5 MG tablet, Take 1 tablet (5 mg total) by mouth daily., Disp: 30 tablet, Rfl: 0 .  LORazepam (ATIVAN) 0.5 MG tablet, Take 1 tablet (0.5 mg total) by mouth every 6 (six) hours as needed (Nausea/vomiting or anxiety)., Disp: 90 tablet, Rfl: 0 .  magnesium chloride (SLOW-MAG) 64 MG TBEC SR tablet, Take 1 tablet (64 mg total) by mouth daily., Disp: 60 tablet, Rfl: 0 .  omeprazole (PRILOSEC) 20 MG capsule, Take 20 mg by mouth daily as needed (acid  reflux)., Disp: , Rfl:  .  ondansetron (ZOFRAN) 8 MG tablet, Take 1 tablet (8 mg total) by mouth 2 (two) times daily as needed for refractory nausea / vomiting. Start on day 3 after carboplatin chemo. (Patient not taking: Reported on 10/27/2019), Disp: 30 tablet, Rfl: 1 .  oxyCODONE-acetaminophen (PERCOCET/ROXICET) 5-325 MG tablet, Take 1-2 tablets by mouth every 4 (four) hours as needed for moderate pain. (Patient not taking: Reported on 09/23/2019), Disp: 40 tablet, Rfl: 0 .  polyethylene glycol (MIRALAX / GLYCOLAX) 17 g packet, Take 17 g by mouth 2 (two) times daily., Disp: , Rfl:  .  potassium chloride SA (KLOR-CON) 10 MEQ tablet, Take 1 tablet (10 mEq total) by mouth 2 (two) times daily., Disp: 60 tablet, Rfl: 0 .  prochlorperazine (COMPAZINE) 10 MG tablet, Take 1 tablet (10 mg total) by mouth every 6 (six) hours as needed (Nausea or vomiting)., Disp: 30 tablet, Rfl: 1 .  psyllium (METAMUCIL) 0.52 g capsule, Take 0.52 g by mouth in the morning and at bedtime., Disp: , Rfl:  .  rOPINIRole (REQUIP) 1 MG tablet, Take 1 tablet (1 mg total) by mouth at bedtime., Disp: 30 tablet, Rfl: 2 .  senna (SENOKOT) 8.6 MG tablet, Take 1 tablet by mouth daily as needed. , Disp: , Rfl:  .  triamcinolone cream (KENALOG) 0.1 %, Apply 1 application topically 2 (two) times daily. Apply to affected area twice a day as directed. (Patient not taking: Reported on 10/27/2019), Disp: 80 g, Rfl: 0  Physical exam: Exam limited due to telemedicine   CMP Latest Ref Rng & Units 11/03/2019  Glucose 70 - 99 mg/dL 130(H)  BUN 8 - 23 mg/dL 18  Creatinine 0.44 - 1.00 mg/dL 0.84  Sodium 135 - 145 mmol/L 129(L)  Potassium 3.5 - 5.1 mmol/L 4.0  Chloride 98 - 111 mmol/L 93(L)  CO2 22 - 32 mmol/L 25  Calcium 8.9 - 10.3 mg/dL 10.3  Total Protein 6.5 - 8.1 g/dL 6.2(L)  Total Bilirubin 0.3 - 1.2  mg/dL 0.4  Alkaline Phos 38 - 126 U/L 108  AST 15 - 41 U/L 16  ALT 0 - 44 U/L 30   CBC Latest Ref Rng & Units 11/03/2019  WBC 4.0 - 10.5 K/uL  15.6(H)  Hemoglobin 12.0 - 15.0 g/dL 13.3  Hematocrit 36.0 - 46.0 % 38.6  Platelets 150 - 400 K/uL 205    No images are attached to the encounter.  No results found.  Assessment and plan- Patient is a 74 y.o. female diagnosed with ovarian cancer currently receiving chemotherapy who presents to symptom management clinic for complaints of constipation in setting of history of IBS with constipation.  1.  Constipation-likely secondary to low fiber intake, reduced fluid intake, and reduced physical activity.  Anticholinergics as premeds for chemotherapy may also be contributing.  History of IBS with constipation as well as chemotherapy-induced diarrhea.  Encouraged use of MiraLAX 1-3 times a day. If no bowel movement by evening add senna 8.6 mg tablet, 1-2 divided once to twice a day. If no bowel movement after 48 hours, use of magnesium citrate indicated.  Fluids as able, exercise as able, fiber as able.  Stop laxatives if diarrhea, nausea or vomiting, concerns for obstruction.   Disposition:  Return to clinic as scheduled or sooner if symptoms do not improve or worsen in the interim.  Visit Diagnosis 1. Constipation, unspecified constipation type    Patient expressed understanding and was in agreement with this plan. She also understands that She can call clinic at any time with any questions, concerns, or complaints.   I discussed the assessment and treatment plan with the patient. The patient was provided an opportunity to ask questions and all were answered. The patient agreed with the plan and demonstrated an understanding of the instructions.   The patient was advised to call back or seek an in-person evaluation if the symptoms worsen or if the condition fails to improve as anticipated.   I provided 10 minutes of non face-to-face telephone visit time during this encounter, and > 50% was spent counseling as documented under my assessment & plan.  Thank you for allowing me to participate  in the care of this very pleasant patient.   Beckey Rutter, DNP, AGNP-C [facility]

## 2019-11-03 NOTE — Progress Notes (Signed)
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[place])  08/19/2019 Initial Diagnosis   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[place])   09/07/2019 Cancer Staging   Staging form: Ovary, Fallopian Tube, and Primary Peritoneal Carcinoma, AJCC 8th Edition - Pathologic stage from 09/07/2019: FIGO Stage IC2, calculated as Stage IC (pT1c2, pN0, cM0) - Signed by Sindy Guadeloupe, MD on 09/07/2019   09/15/2019 -  Chemotherapy   The patient had palonosetron (ALOXI) injection 0.25 mg, 0.25 mg, Intravenous,  Once, 3 of 6 cycles Administration: 0.25 mg (09/15/2019), 0.25 mg (10/06/2019), 0.25 mg (10/27/2019) pegfilgrastim (NEULASTA ONPRO KIT) injection 6 mg, 6 mg, Subcutaneous, Once, 3 of 6 cycles Administration: 6 mg (09/15/2019), 6 mg (10/06/2019), 6 mg (10/27/2019) CARBOplatin (PARAPLATIN) 350 mg in sodium chloride 0.9 % 250 mL chemo infusion, 350 mg (100 % of original dose 354.8 mg), Intravenous,  Once, 3 of 6 cycles Dose modification:   (original dose 354.8 mg, Cycle 1) Administration: 350 mg (09/15/2019), 350 mg (10/06/2019), 350 mg (10/27/2019) fosaprepitant (EMEND) 150 mg in sodium chloride 0.9 % 145 mL IVPB, 150 mg, Intravenous,  Once, 3 of 6 cycles Administration: 150 mg (09/15/2019), 150 mg (10/06/2019), 150 mg (10/27/2019) PACLitaxel (TAXOL) 336 mg in sodium chloride 0.9 % 500 mL chemo infusion (> 3m/m2), 175 mg/m2 = 336 mg, Intravenous,   Once, 3 of 6 cycles Administration: 336 mg (09/15/2019), 336 mg (10/06/2019), 336 mg (10/27/2019)  for chemotherapy treatment.    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  The CancerNext gene panel offered by [employer] sequencing and rearrangement analysis for the following 34 genes:   APC, ATM, BARD1, BMPR1A, BRCA1, BRCA2, BRIP1, CDH1, CDK4, CDKN2A, CHEK2, DICER1, HOXB13, EPCAM, GREM1, MLH1, MRE11A, MSH2, MSH6, MUTYH, NBN, NF1, PALB2, PMS2, POLD1, POLE, PTEN, RAD50, RAD51C, RAD51D, SMAD4, SMARCA4, STK11, and TP53.  The report date is 10/29/2019.  TumorHRD testing was negative.  This suggests that the patient's tumor is not more susceptible to PARP inhibitor.     Interval history-Carrie Roberson 74year old female diagnosed with ovarian cancer currently receiving carbotaxol chemotherapy presents to symptom management clinic for complaints of alternating constipation and diarrhea.  She was seen virtually on Friday of last week with complaints of constipation.  She began bowel regimen with MiraLAX and senna.  Over the weekend she developed stool urgency.  Describes stool is formed and soft but had approximately 6 bowel movements per day.  She was concerned of dehydration and took Imodium which resolved symptoms.  No bowel movement today.  She has a history of IBS with constipation. She's had some aches and pains. Restless leg improved. Experiences restlessness & anxiety with steroids. Endorses taste changes. Weight down 2 lbs. She had a failure of her neulasta with last administration and is unsure if she   ECOG FS:1 - Symptomatic but completely ambulatory  Review of systems- Review of Systems  Constitutional: Positive for weight loss (2 lbs). Negative for chills, fever and malaise/fatigue.  HENT:  Negative for hearing loss, nosebleeds, sore throat and tinnitus.        Taste changes  Eyes: Negative for blurred vision and double vision.  Respiratory:  Negative for cough, hemoptysis, shortness of breath and wheezing.   Cardiovascular: Negative for chest pain, palpitations and leg swelling.  Gastrointestinal: Positive for constipation. Negative for abdominal pain, blood in stool, diarrhea (frequency but formed stools), melena, nausea and vomiting.  Genitourinary: Negative for dysuria and urgency.  Musculoskeletal: Positive for joint pain and myalgias. Negative for back pain and falls.  Skin: Negative for itching and rash.  Neurological: Negative for dizziness, tingling, sensory change, loss of consciousness, weakness and headaches.  Endo/Heme/Allergies: Negative for environmental allergies. Does not bruise/bleed easily.  Psychiatric/Behavioral: Negative for depression. The patient is not nervous/anxious and does not have insomnia.      Current treatment- carbo-taxol  Allergies  Allergen Reactions  . Citalopram Nausea And Vomiting  . Ivp Dye [Iodinated Diagnostic Agents] Hives and Swelling    hives  . Meloxicam     unknown  . Adhesive [Tape] Rash  . Amlodipine Rash  . Ampicillin Rash    Did it involve swelling of the face/tongue/throat, SOB, or low BP? No Did it involve sudden or severe rash/hives, skin peeling, or any reaction on the inside of your mouth or nose? Yes Did you need to seek medical attention at a hospital or doctor's office? Yes When did it last happen? 10 + years If all above answers are "NO", may proceed with cephalosporin use.  . Cefoxitin Rash  . Estradiol Rash  . Estropipate Rash  . Olmesartan Medoxomil-Hctz Rash  . Sertraline Rash       . Sulfa Antibiotics Rash    Past Medical History:  Diagnosis Date  . Anxiety   . Arthritis   . Family history of breast cancer   . Family history of leukemia   . GERD (gastroesophageal reflux disease)   . History of ischemic colitis 09/02/2017  . Hx of colonic polyp   . Hypertension    H/O-PT TAKEN OFF BY PCP AS OF 2019  . IBS (irritable bowel syndrome)   .  Ovarian cancer ([place])   . Ovarian cyst   . Vaginal atrophy     Past Surgical History:  Procedure Laterality Date  . BREAST BIOPSY Left    needle bx/clip-neg  . CERVICAL BIOPSY  W/ LOOP ELECTRODE EXCISION    . CHOLECYSTECTOMY    . Colonoscopoy  2007, 2008, 2011  . COLONOSCOPY WITH PROPOFOL N/A 05/05/2018   Procedure: COLONOSCOPY WITH PROPOFOL;  Surgeon: Lin Landsman, MD;  Location: [facility] ENDOSCOPY;  Service: Gastroenterology;  Laterality: N/A;  . HYSTERECTOMY ABDOMINAL WITH SALPINGO-OOPHORECTOMY N/A 08/19/2019   Procedure: HYSTERECTOMY ABDOMINAL WITH SALPINGO-OOPHORECTOMY WITH PELVIC WASHINGS PERITONEAL BIOPSIES AND PARA AORTIC LYMPH NODE DISSECTION;  Surgeon: [name], MD;  Location: [facility] ORS;  Service: Gynecology;  Laterality: N/A;  . LEEP    . OMENTECTOMY N/A 08/19/2019   Procedure: OMENTECTOMY;  Surgeon: [name], MD;  Location: [facility] ORS;  Service: Gynecology;  Laterality: N/A;  . PORTA CATH INSERTION N/A 09/11/2019   Procedure: PORTA CATH INSERTION;  Surgeon: Algernon Huxley, MD;  Location: [place] CV LAB;  Service: Cardiovascular;  Laterality: N/A;  . TUBAL LIGATION  1999    Social History   Socioeconomic History  . Marital status: Single    Spouse name: Not on file  . Number of children: 0  . Years of education: Not on file  .  Highest education level: Associate degree: academic program  Occupational History  . Occupation: Retired  Tobacco Use  . Smoking status: Never Smoker  . Smokeless tobacco: Never Used  . Tobacco comment: smoking cessation materials not required  Substance and Sexual Activity  . Alcohol use: Yes    Comment: rarely  . Drug use: No  . Sexual activity: Never    Birth control/protection: Post-menopausal  Other Topics Concern  . Not on file  Social History Narrative  . Not on file   Social Determinants of Health   Financial Resource Strain:   . Difficulty of Paying Living Expenses:   Food Insecurity:   . Worried About  Charity fundraiser in the Last Year:   . Arboriculturist in the Last Year:   Transportation Needs:   . Film/video editor (Medical):   Marland Kitchen Lack of Transportation (Non-Medical):   Physical Activity:   . Days of Exercise per Week:   . Minutes of Exercise per Session:   Stress:   . Feeling of Stress :   Social Connections:   . Frequency of Communication with Friends and Family:   . Frequency of Social Gatherings with Friends and Family:   . Attends Religious Services:   . Active Member of Clubs or Organizations:   . Attends Archivist Meetings:   Marland Kitchen Marital Status:   Intimate Partner Violence:   . Fear of Current or Ex-Partner:   . Emotionally Abused:   Marland Kitchen Physically Abused:   . Sexually Abused:     Family History  Problem Relation Age of Onset  . Heart failure Mother   . Atrial fibrillation Mother   . Hypertension Mother   . Diabetes Father   . Heart attack Father   . Breast cancer Maternal Aunt 70  . Breast cancer Maternal Grandmother 80  . Breast cancer Cousin        2 mat cousins  . Hypertension Sister   . Hypertension Sister   . Arthritis Sister   . Leukemia Paternal Aunt   . Leukemia Paternal Uncle   . Brain cancer Paternal Aunt   . Other Brother 13       Drowning accident  . Liver cancer Maternal Uncle   . Alcohol abuse Maternal Uncle   . Prostate cancer Neg Hx   . Kidney cancer Neg Hx      Current Outpatient Medications:  .  acetaminophen (TYLENOL) 500 MG tablet, Take 1,000 mg by mouth every 6 (six) hours as needed (for pain.)., Disp: , Rfl:  .  amitriptyline (ELAVIL) 25 MG tablet, Take 2 tablets (50 mg total) by mouth at bedtime., Disp: 90 tablet, Rfl: 0 .  benzocaine-resorcinol (VAGISIL) 5-2 % vaginal cream, Place 1 application vaginally daily as needed for itching or irritation., Disp: , Rfl:  .  cetirizine (ZYRTEC) 10 MG tablet, Take 10 mg by mouth daily as needed (allergies.). , Disp: , Rfl:  .  dexamethasone (DECADRON) 4 MG tablet, Take 2  tablets (8 mg total) by mouth daily. Start the day after carboplatin chemotherapy for 3 days., Disp: 30 tablet, Rfl: 1 .  diphenhydrAMINE (BENADRYL) 25 MG tablet, Take 25 mg by mouth daily as needed for itching or allergies., Disp: , Rfl:  .  guaiFENesin (MUCINEX) 600 MG 12 hr tablet, Take 600 mg by mouth at bedtime as needed (congestion)., Disp: , Rfl:  .  hydrocortisone cream 1 %, Apply 1 application topically daily as needed for itching., Disp: ,  Rfl:  .  lidocaine-prilocaine (EMLA) cream, Apply to affected area once (Patient taking differently: Apply 1 application topically daily as needed (prior to port being accessed.). ), Disp: 30 g, Rfl: 3 .  LORazepam (ATIVAN) 0.5 MG tablet, Take 1 tablet (0.5 mg total) by mouth every 6 (six) hours as needed (Nausea/vomiting or anxiety)., Disp: 90 tablet, Rfl: 0 .  magnesium chloride (SLOW-MAG) 64 MG TBEC SR tablet, Take 1 tablet (64 mg total) by mouth daily., Disp: 60 tablet, Rfl: 0 .  omeprazole (PRILOSEC) 20 MG capsule, Take 20 mg by mouth daily as needed (acid reflux)., Disp: , Rfl:  .  polyethylene glycol (MIRALAX / GLYCOLAX) 17 g packet, Take 17 g by mouth 2 (two) times daily., Disp: , Rfl:  .  potassium chloride SA (KLOR-CON) 10 MEQ tablet, Take 1 tablet (10 mEq total) by mouth 2 (two) times daily., Disp: 60 tablet, Rfl: 0 .  prochlorperazine (COMPAZINE) 10 MG tablet, Take 1 tablet (10 mg total) by mouth every 6 (six) hours as needed (Nausea or vomiting)., Disp: 30 tablet, Rfl: 1 .  psyllium (METAMUCIL) 0.52 g capsule, Take 0.52 g by mouth in the morning and at bedtime., Disp: , Rfl:  .  rOPINIRole (REQUIP) 1 MG tablet, Take 1 tablet (1 mg total) by mouth at bedtime., Disp: 30 tablet, Rfl: 2 .  senna (SENOKOT) 8.6 MG tablet, Take 1 tablet by mouth daily as needed. , Disp: , Rfl:  .  bacitracin 500 UNIT/GM ointment, Apply 1 application topically 2 (two) times daily. Apply to affected area twice a day as directed. (Patient not taking: Reported on  10/27/2019), Disp: 15 g, Rfl: 1 .  fluconazole (DIFLUCAN) 150 MG tablet, Take 1 tablet (150 mg total) by mouth daily. Take 1 tablet (150 mg) by mouth. Three days later, take second tablet. (Patient not taking: Reported on 10/27/2019), Disp: 2 tablet, Rfl: 1 .  fluticasone (FLONASE) 50 MCG/ACT nasal spray, Place 2 sprays into both nostrils daily. (Patient not taking: Reported on 10/13/2019), Disp: 16 g, Rfl: 0 .  lisinopril (ZESTRIL) 5 MG tablet, Take 1 tablet (5 mg total) by mouth daily., Disp: 30 tablet, Rfl: 0 .  ondansetron (ZOFRAN) 8 MG tablet, Take 1 tablet (8 mg total) by mouth 2 (two) times daily as needed for refractory nausea / vomiting. Start on day 3 after carboplatin chemo. (Patient not taking: Reported on 10/27/2019), Disp: 30 tablet, Rfl: 1 .  oxyCODONE-acetaminophen (PERCOCET/ROXICET) 5-325 MG tablet, Take 1-2 tablets by mouth every 4 (four) hours as needed for moderate pain. (Patient not taking: Reported on 09/23/2019), Disp: 40 tablet, Rfl: 0 .  triamcinolone cream (KENALOG) 0.1 %, Apply 1 application topically 2 (two) times daily. Apply to affected area twice a day as directed. (Patient not taking: Reported on 10/27/2019), Disp: 80 g, Rfl: 0  Physical exam:  Vitals:   11/03/19 0925  BP: 133/71  Pulse: 89  Temp: (!) 97.5 F (36.4 C)  TempSrc: Tympanic  Weight: 176 lb 8 oz (80.1 kg)   Physical Exam Constitutional:      Appearance: She is well-developed.  HENT:     Head: Atraumatic.     Mouth/Throat:     Pharynx: No oropharyngeal exudate.  Eyes:     General: No scleral icterus. Pulmonary:     Effort: Pulmonary effort is normal.  Abdominal:     General: There is no distension.  Musculoskeletal:        General: Normal range of motion.  Skin:  General: Skin is warm and dry.  Neurological:     Mental Status: She is alert and oriented to person, place, and time.     Motor: No weakness.     Comments: Ambulates w/o aids  Psychiatric:        Mood and Affect: Mood normal.         Behavior: Behavior normal.      CMP Latest Ref Rng & Units 11/03/2019  Glucose 70 - 99 mg/dL 130(H)  BUN 8 - 23 mg/dL 18  Creatinine 0.44 - 1.00 mg/dL 0.84  Sodium 135 - 145 mmol/L 129(L)  Potassium 3.5 - 5.1 mmol/L 4.0  Chloride 98 - 111 mmol/L 93(L)  CO2 22 - 32 mmol/L 25  Calcium 8.9 - 10.3 mg/dL 10.3  Total Protein 6.5 - 8.1 g/dL 6.2(L)  Total Bilirubin 0.3 - 1.2 mg/dL 0.4  Alkaline Phos 38 - 126 U/L 108  AST 15 - 41 U/L 16  ALT 0 - 44 U/L 30   CBC Latest Ref Rng & Units 11/03/2019  WBC 4.0 - 10.5 K/uL 15.6(H)  Hemoglobin 12.0 - 15.0 g/dL 13.3  Hematocrit 36.0 - 46.0 % 38.6  Platelets 150 - 400 K/uL 205    No images are attached to the encounter.  No results found.  Assessment and plan- Patient is a 74 y.o. female diagnosed with ovarian cancer, currently receiving carbo-taxol chemotherapy who presents to symptom management clinic for electrolyte abnormalities and alternations in bowel movements.   1. Hyponatremia- likely secondary to thiazide diuretic and fluid volume deficit. IV fluids in clinic today. Stop hctz. Start lisinopril 5 mg daily. Monitor blood pressure at home.   2. Alternating constipation and diarrhea- secondary to chemo, alterations in diet & physical activity, and hx of ibs-constipation with recent stool softeners. Continue stool softeners & laxatives as needed with goal of 1-2 bowel movements every day to every other day without pain or straining. If > 2 bowel movements per day or transition to watery diarrhea, consider anti-diarrheal agents. Encourage fiber, activity, and fluids.   3. Arthalgias and Myalgias- likely secondary to GCSF administration. Wbcs today consistent with administration of neulasta. Suspect she received dose based on her symptoms and bloodwork today. Encouraged use of claritin and tylenol of symptoms.   Disposition:  Follow up with Dr. Janese Banks as scheduled. RTC if symptoms do not improve or worsen in the interim.    Visit Diagnosis 1.  Hyponatremia   2. Alternating constipation and diarrhea   3. 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     Patient expressed understanding and was in agreement with this plan. She also understands that She can call clinic at any time with any questions, concerns, or complaints.   Thank you for allowing me to participate in the care of this very pleasant patient.   Beckey Rutter, DNP, AGNP-[facility] [phone]  CC: Dr. Janese Banks, Faythe Casa, NP

## 2019-11-04 ENCOUNTER — Encounter: Payer: Self-pay | Admitting: Oncology

## 2019-11-05 ENCOUNTER — Other Ambulatory Visit: Payer: Self-pay | Admitting: *Deleted

## 2019-11-05 ENCOUNTER — Other Ambulatory Visit: Payer: Self-pay

## 2019-11-05 ENCOUNTER — Other Ambulatory Visit: Payer: Self-pay | Admitting: Nurse Practitioner

## 2019-11-05 ENCOUNTER — Telehealth: Payer: Self-pay | Admitting: *Deleted

## 2019-11-05 ENCOUNTER — Inpatient Hospital Stay: Payer: Medicare Other

## 2019-11-05 DIAGNOSIS — Z5189 Encounter for other specified aftercare: Secondary | ICD-10-CM | POA: Diagnosis not present

## 2019-11-05 DIAGNOSIS — C561 Malignant neoplasm of right ovary: Secondary | ICD-10-CM

## 2019-11-05 DIAGNOSIS — D649 Anemia, unspecified: Secondary | ICD-10-CM | POA: Diagnosis not present

## 2019-11-05 DIAGNOSIS — R300 Dysuria: Secondary | ICD-10-CM

## 2019-11-05 DIAGNOSIS — R3915 Urgency of urination: Secondary | ICD-10-CM

## 2019-11-05 DIAGNOSIS — K521 Toxic gastroenteritis and colitis: Secondary | ICD-10-CM

## 2019-11-05 DIAGNOSIS — E871 Hypo-osmolality and hyponatremia: Secondary | ICD-10-CM | POA: Diagnosis not present

## 2019-11-05 DIAGNOSIS — D701 Agranulocytosis secondary to cancer chemotherapy: Secondary | ICD-10-CM | POA: Diagnosis not present

## 2019-11-05 DIAGNOSIS — Z5111 Encounter for antineoplastic chemotherapy: Secondary | ICD-10-CM | POA: Diagnosis not present

## 2019-11-05 DIAGNOSIS — T451X5A Adverse effect of antineoplastic and immunosuppressive drugs, initial encounter: Secondary | ICD-10-CM

## 2019-11-05 LAB — CBC WITH DIFFERENTIAL/PLATELET
Abs Immature Granulocytes: 2.37 10*3/uL — ABNORMAL HIGH (ref 0.00–0.07)
Basophils Absolute: 0 10*3/uL (ref 0.0–0.1)
Basophils Relative: 0 %
Eosinophils Absolute: 0.1 10*3/uL (ref 0.0–0.5)
Eosinophils Relative: 0 %
HCT: 38 % (ref 36.0–46.0)
Hemoglobin: 13.1 g/dL (ref 12.0–15.0)
Immature Granulocytes: 9 %
Lymphocytes Relative: 12 %
Lymphs Abs: 3 10*3/uL (ref 0.7–4.0)
MCH: 30.4 pg (ref 26.0–34.0)
MCHC: 34.5 g/dL (ref 30.0–36.0)
MCV: 88.2 fL (ref 80.0–100.0)
Monocytes Absolute: 2.3 10*3/uL — ABNORMAL HIGH (ref 0.1–1.0)
Monocytes Relative: 9 %
Neutro Abs: 17.3 10*3/uL — ABNORMAL HIGH (ref 1.7–7.7)
Neutrophils Relative %: 70 %
Platelets: 276 10*3/uL (ref 150–400)
RBC: 4.31 MIL/uL (ref 3.87–5.11)
RDW: 18.2 % — ABNORMAL HIGH (ref 11.5–15.5)
WBC: 25.1 10*3/uL — ABNORMAL HIGH (ref 4.0–10.5)
nRBC: 0.1 % (ref 0.0–0.2)

## 2019-11-05 LAB — COMPREHENSIVE METABOLIC PANEL
ALT: 27 U/L (ref 0–44)
AST: 17 U/L (ref 15–41)
Albumin: 3.8 g/dL (ref 3.5–5.0)
Alkaline Phosphatase: 131 U/L — ABNORMAL HIGH (ref 38–126)
Anion gap: 10 (ref 5–15)
BUN: 11 mg/dL (ref 8–23)
CO2: 21 mmol/L — ABNORMAL LOW (ref 22–32)
Calcium: 10 mg/dL (ref 8.9–10.3)
Chloride: 98 mmol/L (ref 98–111)
Creatinine, Ser: 0.72 mg/dL (ref 0.44–1.00)
GFR calc Af Amer: >60 mL/min (ref >60)
GFR calc non Af Amer: >60 mL/min (ref >60)
Glucose, Bld: 110 mg/dL — ABNORMAL HIGH (ref 70–99)
Potassium: 4.1 mmol/L (ref 3.5–5.1)
Sodium: 129 mmol/L — ABNORMAL LOW (ref 135–145)
Total Bilirubin: 0.4 mg/dL (ref 0.3–1.2)
Total Protein: 6.6 g/dL (ref 6.5–8.1)

## 2019-11-05 LAB — URINALYSIS, COMPLETE (UACMP) WITH MICROSCOPIC
Bacteria, UA: NONE SEEN
Bilirubin Urine: NEGATIVE
Glucose, UA: NEGATIVE mg/dL
Ketones, ur: NEGATIVE mg/dL
Nitrite: POSITIVE — AB
Protein, ur: 30 mg/dL — AB
Specific Gravity, Urine: 1.016 (ref 1.005–1.030)
WBC, UA: >50 WBC/hpf — ABNORMAL HIGH (ref 0–5)
pH: 5 (ref 5.0–8.0)

## 2019-11-05 NOTE — Telephone Encounter (Signed)
Patient called reporting that she has developed urgency, burning, and a lot of pressure. She thinks she has a UTI and would like to be checked for it and speak with a provider about this.

## 2019-11-05 NOTE — Telephone Encounter (Signed)
Can we check UA/ urine culture today? She is coming for fluids today

## 2019-11-05 NOTE — Telephone Encounter (Signed)
I called pt and she states that she has been told in the past that her taking AZO can do something to the urine test. I checked with Janese Banks and we agreed to do urine in afternoon. Dr. Janese Banks does not feel like she can give atb without urine test. Pt will come in 3:30 today and give specimen

## 2019-11-06 ENCOUNTER — Other Ambulatory Visit: Payer: Self-pay | Admitting: Oncology

## 2019-11-06 MED ORDER — NITROFURANTOIN MONOHYD MACRO 100 MG PO CAPS: 100.0000 mg | ORAL_CAPSULE | Freq: Two times a day (BID) | ORAL | 0 refills | Status: DC

## 2019-11-07 LAB — URINE CULTURE: Culture: 40000 — AB

## 2019-11-10 ENCOUNTER — Inpatient Hospital Stay: Payer: Medicare Other

## 2019-11-10 ENCOUNTER — Other Ambulatory Visit: Payer: Self-pay

## 2019-11-10 DIAGNOSIS — D649 Anemia, unspecified: Secondary | ICD-10-CM | POA: Diagnosis not present

## 2019-11-10 DIAGNOSIS — C561 Malignant neoplasm of right ovary: Secondary | ICD-10-CM

## 2019-11-10 DIAGNOSIS — Z5189 Encounter for other specified aftercare: Secondary | ICD-10-CM | POA: Diagnosis not present

## 2019-11-10 DIAGNOSIS — E871 Hypo-osmolality and hyponatremia: Secondary | ICD-10-CM | POA: Diagnosis not present

## 2019-11-10 DIAGNOSIS — D701 Agranulocytosis secondary to cancer chemotherapy: Secondary | ICD-10-CM | POA: Diagnosis not present

## 2019-11-10 DIAGNOSIS — Z5111 Encounter for antineoplastic chemotherapy: Secondary | ICD-10-CM | POA: Diagnosis not present

## 2019-11-10 LAB — CBC WITH DIFFERENTIAL/PLATELET
Abs Immature Granulocytes: 0.25 10*3/uL — ABNORMAL HIGH (ref 0.00–0.07)
Basophils Absolute: 0.1 10*3/uL (ref 0.0–0.1)
Basophils Relative: 1 %
Eosinophils Absolute: 0.1 10*3/uL (ref 0.0–0.5)
Eosinophils Relative: 1 %
HCT: 37.3 % (ref 36.0–46.0)
Hemoglobin: 12.5 g/dL (ref 12.0–15.0)
Immature Granulocytes: 4 %
Lymphocytes Relative: 20 %
Lymphs Abs: 1.3 10*3/uL (ref 0.7–4.0)
MCH: 30.2 pg (ref 26.0–34.0)
MCHC: 33.5 g/dL (ref 30.0–36.0)
MCV: 90.1 fL (ref 80.0–100.0)
Monocytes Absolute: 0.7 10*3/uL (ref 0.1–1.0)
Monocytes Relative: 11 %
Neutro Abs: 4.2 10*3/uL (ref 1.7–7.7)
Neutrophils Relative %: 63 %
Platelets: 238 10*3/uL (ref 150–400)
RBC: 4.14 MIL/uL (ref 3.87–5.11)
RDW: 18.2 % — ABNORMAL HIGH (ref 11.5–15.5)
WBC: 6.6 10*3/uL (ref 4.0–10.5)
nRBC: 0 % (ref 0.0–0.2)

## 2019-11-10 LAB — COMPREHENSIVE METABOLIC PANEL
ALT: 26 U/L (ref 0–44)
AST: 18 U/L (ref 15–41)
Albumin: 3.6 g/dL (ref 3.5–5.0)
Alkaline Phosphatase: 96 U/L (ref 38–126)
Anion gap: 8 (ref 5–15)
BUN: 13 mg/dL (ref 8–23)
CO2: 24 mmol/L (ref 22–32)
Calcium: 10.2 mg/dL (ref 8.9–10.3)
Chloride: 102 mmol/L (ref 98–111)
Creatinine, Ser: 0.59 mg/dL (ref 0.44–1.00)
GFR calc Af Amer: >60 mL/min (ref >60)
GFR calc non Af Amer: >60 mL/min (ref >60)
Glucose, Bld: 110 mg/dL — ABNORMAL HIGH (ref 70–99)
Potassium: 4.3 mmol/L (ref 3.5–5.1)
Sodium: 134 mmol/L — ABNORMAL LOW (ref 135–145)
Total Bilirubin: 0.4 mg/dL (ref 0.3–1.2)
Total Protein: 6.3 g/dL — ABNORMAL LOW (ref 6.5–8.1)

## 2019-11-10 MED ORDER — SODIUM CHLORIDE 0.9% FLUSH
10.0000 mL | Freq: Once | INTRAVENOUS | Status: AC
  Administered 2019-11-10: 11:00:00 10 mL via INTRAVENOUS
  Filled 2019-11-10: qty 10

## 2019-11-10 MED ORDER — HEPARIN SOD (PORK) LOCK FLUSH 100 UNIT/ML IV SOLN
500.0000 [IU] | Freq: Once | INTRAVENOUS | Status: AC
  Administered 2019-11-10: 500 [IU] via INTRAVENOUS
  Filled 2019-11-10: qty 5

## 2019-11-10 MED ORDER — SODIUM CHLORIDE 0.9 % IV SOLN
Freq: Once | INTRAVENOUS | Status: AC
  Filled 2019-11-10: qty 250

## 2019-11-10 NOTE — Progress Notes (Signed)

## 2019-11-13 ENCOUNTER — Telehealth: Payer: Self-pay | Admitting: *Deleted

## 2019-11-13 NOTE — Telephone Encounter (Signed)
Please ask her to come for repeat UA. If positive, I will give her a different antibiotic. If negative- we can try pyridium

## 2019-11-13 NOTE — Telephone Encounter (Signed)
Called patient and asked her to come in to the office and leave [place] a urine sample. Patient stated that it was too late for her to come in but that she will see how she feels over the weekend and if she continues to have an urgency, then she will call us back on Monday morning to let us know if she still had the same symptoms and then come in to leave [place] a urine sample.

## 2019-11-13 NOTE — Telephone Encounter (Signed)
Patient called to report that she has completed medication for UTI but is still symptomatic. Please advise.

## 2019-11-16 ENCOUNTER — Telehealth: Payer: Self-pay

## 2019-11-16 MED ORDER — DEXAMETHASONE 4 MG PO TABS: 8.0000 mg | ORAL_TABLET | Freq: Every day | ORAL | 1 refills | Status: DC

## 2019-11-16 NOTE — Progress Notes (Signed)
Patient called to review chart for oncology follow-up appointment,  complaints of pain in legs, she states it feels like she has restless leg syndrome, which is not new. Pt also asks for refill on her decadron.

## 2019-11-17 ENCOUNTER — Inpatient Hospital Stay: Payer: Medicare Other

## 2019-11-17 ENCOUNTER — Inpatient Hospital Stay (HOSPITAL_BASED_OUTPATIENT_CLINIC_OR_DEPARTMENT_OTHER): Payer: Medicare Other | Admitting: Oncology

## 2019-11-17 ENCOUNTER — Other Ambulatory Visit: Payer: Self-pay

## 2019-11-17 VITALS — BP 153/85 | HR 83 | Temp 96.9°F | Resp 16 | Wt 180.8 lb

## 2019-11-17 DIAGNOSIS — E871 Hypo-osmolality and hyponatremia: Secondary | ICD-10-CM | POA: Diagnosis not present

## 2019-11-17 DIAGNOSIS — D709 Neutropenia, unspecified: Secondary | ICD-10-CM

## 2019-11-17 DIAGNOSIS — D701 Agranulocytosis secondary to cancer chemotherapy: Secondary | ICD-10-CM | POA: Diagnosis not present

## 2019-11-17 DIAGNOSIS — C561 Malignant neoplasm of right ovary: Secondary | ICD-10-CM

## 2019-11-17 DIAGNOSIS — Z5111 Encounter for antineoplastic chemotherapy: Secondary | ICD-10-CM | POA: Diagnosis not present

## 2019-11-17 DIAGNOSIS — Z95828 Presence of other vascular implants and grafts: Secondary | ICD-10-CM

## 2019-11-17 DIAGNOSIS — G2581 Restless legs syndrome: Secondary | ICD-10-CM | POA: Diagnosis not present

## 2019-11-17 DIAGNOSIS — D649 Anemia, unspecified: Secondary | ICD-10-CM | POA: Diagnosis not present

## 2019-11-17 DIAGNOSIS — Z5189 Encounter for other specified aftercare: Secondary | ICD-10-CM | POA: Diagnosis not present

## 2019-11-17 LAB — CBC WITH DIFFERENTIAL/PLATELET
Abs Immature Granulocytes: 0 10*3/uL (ref 0.00–0.07)
Basophils Absolute: 0.1 10*3/uL (ref 0.0–0.1)
Basophils Relative: 2 %
Eosinophils Absolute: 0 10*3/uL (ref 0.0–0.5)
Eosinophils Relative: 1 %
HCT: 37.6 % (ref 36.0–46.0)
Hemoglobin: 12.6 g/dL (ref 12.0–15.0)
Lymphocytes Relative: 46 %
Lymphs Abs: 1.5 10*3/uL (ref 0.7–4.0)
MCH: 30.7 pg (ref 26.0–34.0)
MCHC: 33.5 g/dL (ref 30.0–36.0)
MCV: 91.7 fL (ref 80.0–100.0)
Monocytes Absolute: 0.4 10*3/uL (ref 0.1–1.0)
Monocytes Relative: 13 %
Neutro Abs: 1.3 10*3/uL — ABNORMAL LOW (ref 1.7–7.7)
Neutrophils Relative %: 38 %
Platelets: 221 10*3/uL (ref 150–400)
RBC: 4.1 MIL/uL (ref 3.87–5.11)
RDW: 18.3 % — ABNORMAL HIGH (ref 11.5–15.5)
Smear Review: NORMAL
WBC: 3.3 10*3/uL — ABNORMAL LOW (ref 4.0–10.5)
nRBC: 0 % (ref 0.0–0.2)

## 2019-11-17 LAB — COMPREHENSIVE METABOLIC PANEL
ALT: 27 U/L (ref 0–44)
AST: 16 U/L (ref 15–41)
Albumin: 3.6 g/dL (ref 3.5–5.0)
Alkaline Phosphatase: 83 U/L (ref 38–126)
Anion gap: 6 (ref 5–15)
BUN: 10 mg/dL (ref 8–23)
CO2: 26 mmol/L (ref 22–32)
Calcium: 10.4 mg/dL — ABNORMAL HIGH (ref 8.9–10.3)
Chloride: 102 mmol/L (ref 98–111)
Creatinine, Ser: 0.59 mg/dL (ref 0.44–1.00)
GFR calc Af Amer: >60 mL/min (ref >60)
GFR calc non Af Amer: >60 mL/min (ref >60)
Glucose, Bld: 97 mg/dL (ref 70–99)
Potassium: 4.3 mmol/L (ref 3.5–5.1)
Sodium: 134 mmol/L — ABNORMAL LOW (ref 135–145)
Total Bilirubin: 0.4 mg/dL (ref 0.3–1.2)
Total Protein: 6.2 g/dL — ABNORMAL LOW (ref 6.5–8.1)

## 2019-11-17 MED ORDER — PALONOSETRON HCL INJECTION 0.25 MG/5ML
0.2500 mg | Freq: Once | INTRAVENOUS | Status: AC
  Administered 2019-11-17: 09:00:00 0.25 mg via INTRAVENOUS
  Filled 2019-11-17: qty 5

## 2019-11-17 MED ORDER — DIPHENHYDRAMINE HCL 50 MG/ML IJ SOLN
50.0000 mg | Freq: Once | INTRAMUSCULAR | Status: AC
  Administered 2019-11-17: 50 mg via INTRAVENOUS
  Filled 2019-11-17: qty 1

## 2019-11-17 MED ORDER — SODIUM CHLORIDE 0.9 % IV SOLN
Freq: Once | INTRAVENOUS | Status: AC
  Filled 2019-11-17: qty 250

## 2019-11-17 MED ORDER — SODIUM CHLORIDE 0.9% FLUSH
10.0000 mL | Freq: Once | INTRAVENOUS | Status: AC
  Administered 2019-11-17: 08:00:00 10 mL via INTRAVENOUS
  Filled 2019-11-17: qty 10

## 2019-11-17 MED ORDER — PEGFILGRASTIM 6 MG/0.6ML ~~LOC~~ PSKT
6.0000 mg | PREFILLED_SYRINGE | Freq: Once | SUBCUTANEOUS | Status: AC
  Administered 2019-11-17: 6 mg via SUBCUTANEOUS
  Filled 2019-11-17: qty 0.6

## 2019-11-17 MED ORDER — HEPARIN SOD (PORK) LOCK FLUSH 100 UNIT/ML IV SOLN
500.0000 [IU] | Freq: Once | INTRAVENOUS | Status: DC | PRN
  Filled 2019-11-17: qty 5

## 2019-11-17 MED ORDER — FAMOTIDINE IN NACL 20-0.9 MG/50ML-% IV SOLN
20.0000 mg | Freq: Once | INTRAVENOUS | Status: AC
  Administered 2019-11-17: 09:00:00 20 mg via INTRAVENOUS
  Filled 2019-11-17: qty 50

## 2019-11-17 MED ORDER — SODIUM CHLORIDE 0.9 % IV SOLN
10.0000 mg | Freq: Once | INTRAVENOUS | Status: AC
  Administered 2019-11-17: 10 mg via INTRAVENOUS
  Filled 2019-11-17: qty 10

## 2019-11-17 MED ORDER — SODIUM CHLORIDE 0.9 % IV SOLN
175.0000 mg/m2 | Freq: Once | INTRAVENOUS | Status: AC
  Administered 2019-11-17: 11:00:00 336 mg via INTRAVENOUS
  Filled 2019-11-17: qty 56

## 2019-11-17 MED ORDER — SODIUM CHLORIDE 0.9 % IV SOLN
150.0000 mg | Freq: Once | INTRAVENOUS | Status: AC
  Administered 2019-11-17: 150 mg via INTRAVENOUS
  Filled 2019-11-17: qty 150

## 2019-11-17 MED ORDER — HEPARIN SOD (PORK) LOCK FLUSH 100 UNIT/ML IV SOLN
INTRAVENOUS | Status: AC
  Filled 2019-11-17: qty 5

## 2019-11-17 MED ORDER — SODIUM CHLORIDE 0.9 % IV SOLN
354.8000 mg | Freq: Once | INTRAVENOUS | Status: AC
  Administered 2019-11-17: 350 mg via INTRAVENOUS
  Filled 2019-11-17: qty 35

## 2019-11-17 MED ORDER — HEPARIN SOD (PORK) LOCK FLUSH 100 UNIT/ML IV SOLN
500.0000 [IU] | Freq: Once | INTRAVENOUS | Status: AC
  Administered 2019-11-17: 15:00:00 500 [IU] via INTRAVENOUS
  Filled 2019-11-17: qty 5

## 2019-11-17 NOTE — Progress Notes (Signed)
Labs reviewed with MD and treatment team. Per MD to continue with treatment today. Pt and treatment team updated.   Rosalyn Archambault [employer]

## 2019-11-20 ENCOUNTER — Encounter: Payer: Self-pay | Admitting: Oncology

## 2019-11-20 NOTE — Progress Notes (Signed)
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 This was followed by an MR pelvis with and without contrast in December 2020 which showed a large cystic mass arising from the right ovary measuring 1.8 cm. This was followed by TAH/BSO with peritoneal biopsies with pelvic/aortic lymph node dissection pelvic washings and omentectomy with Dr. Fransisca Connors and Dr. Georgianne Fick on 08/19/2019. 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 Angiolymphatic invasion is present. Bilateral fallopian tubes were negative for atypia and malignancy. Peritoneal stone excision negative for malignancy. Uterus with cervix negative for atypia and malignancy. Bladder, right gutter negative for malignancy. 5 lymph nodes were sampled and were negative for malignancy. Pelvic washings were also negative for malignancy. Pathologic staging FIGO1 C2  Patient was seen by Dr. Fransisca Connors postoperatively and recommendation was to proceed with adjuvant chemotherapy carbotaxol for 6 cycles.    Interval history-restless leg symptoms are currently well controlled with Requip.  She has mild tingling numbness in her extremities which is intermittent and  does not affect her quality of life.  Symptoms of burning urination have resolved  ECOG PS- 1 Pain scale- 0   Review of systems- Review of Systems  Constitutional: Positive for malaise/fatigue. Negative for chills, fever and weight loss.  HENT: Negative for congestion, ear discharge and nosebleeds.   Eyes: Negative for blurred vision.  Respiratory: Negative for cough, hemoptysis, sputum production, shortness of breath and wheezing.   Cardiovascular: Negative for chest pain, palpitations, orthopnea and claudication.  Gastrointestinal: Negative for abdominal pain, blood in stool, constipation, diarrhea, heartburn, melena, nausea and vomiting.  Genitourinary: Negative for dysuria, flank pain, frequency, hematuria and urgency.  Musculoskeletal: Negative for back pain, joint pain and myalgias.  Skin: Negative for rash.  Neurological: Positive for sensory change (Peripheral neuropathy). Negative for dizziness, tingling, focal weakness, seizures, weakness and headaches.  Endo/Heme/Allergies: Does not bruise/bleed easily.  Psychiatric/Behavioral: Negative for depression and suicidal ideas. The patient does not have insomnia.        Allergies  Allergen Reactions  . Citalopram Nausea And Vomiting  . Ivp Dye [Iodinated Diagnostic Agents] Hives and Swelling    hives  . Meloxicam     unknown  . Adhesive [Tape] Rash  . Amlodipine Rash  . Ampicillin Rash    Did it involve swelling of the face/tongue/throat, SOB, or low BP? No Did it involve sudden or severe rash/hives, skin peeling, or any reaction on the inside of your mouth or nose? Yes Did you need to seek medical attention at a hospital or doctor's office? Yes When did it last happen? 10 + years If all above answers are "NO", may proceed with cephalosporin use.  . Cefoxitin Rash  . Estradiol Rash  . Estropipate Rash  . Olmesartan Medoxomil-Hctz Rash  . Sertraline Rash       . Sulfa  Antibiotics Rash     Past Medical  History:  Diagnosis Date  . Anxiety   . Arthritis   . Family history of breast cancer   . Family history of leukemia   . GERD (gastroesophageal reflux disease)   . History of ischemic colitis 09/02/2017  . Hx of colonic polyp   . Hypertension    H/O-PT TAKEN OFF BY PCP AS OF 2019  . IBS (irritable bowel syndrome)   . Ovarian cancer ([place])   . Ovarian cyst   . Vaginal atrophy      Past Surgical History:  Procedure Laterality Date  . BREAST BIOPSY Left    needle bx/clip-neg  . CERVICAL BIOPSY  W/ LOOP ELECTRODE EXCISION    . CHOLECYSTECTOMY    . Colonoscopoy  2007, 2008, 2011  . COLONOSCOPY WITH PROPOFOL N/A 05/05/2018   Procedure: COLONOSCOPY WITH PROPOFOL;  Surgeon: Lin Landsman, MD;  Location: [facility] ENDOSCOPY;  Service: Gastroenterology;  Laterality: N/A;  . HYSTERECTOMY ABDOMINAL WITH SALPINGO-OOPHORECTOMY N/A 08/19/2019   Procedure: HYSTERECTOMY ABDOMINAL WITH SALPINGO-OOPHORECTOMY WITH PELVIC WASHINGS PERITONEAL BIOPSIES AND PARA AORTIC LYMPH NODE DISSECTION;  Surgeon: [name], MD;  Location: [facility] ORS;  Service: Gynecology;  Laterality: N/A;  . LEEP    . OMENTECTOMY N/A 08/19/2019   Procedure: OMENTECTOMY;  Surgeon: [name], MD;  Location: [facility] ORS;  Service: Gynecology;  Laterality: N/A;  . PORTA CATH INSERTION N/A 09/11/2019   Procedure: PORTA CATH INSERTION;  Surgeon: Algernon Huxley, MD;  Location: [place] [facility];  Service: Cardiovascular;  Laterality: N/A;  . TUBAL LIGATION  1999    Social History   Socioeconomic History  . Marital status: Single    Spouse name: Not on file  . Number of children: 0  . Years of education: Not on file  . Highest education level: Associate degree: academic program  Occupational History  . Occupation: Retired  Tobacco Use  . Smoking status: Never Smoker  . Smokeless tobacco: Never Used  . Tobacco comment: smoking cessation materials not required  Substance and Sexual Activity  . Alcohol use: Yes     Comment: rarely  . Drug use: No  . Sexual activity: Never    Birth control/protection: Post-menopausal  Other Topics Concern  . Not on file  Social History Narrative  . Not on file   Social Determinants of Health   Financial Resource Strain:   . Difficulty of Paying Living Expenses:   Food Insecurity:   . Worried About Charity fundraiser in the Last Year:   . Arboriculturist in the Last Year:   Transportation Needs:   . Film/video editor (Medical):   Marland Kitchen Lack of Transportation (Non-Medical):   Physical Activity:   . Days of Exercise per Week:   . Minutes of Exercise per Session:   Stress:   . Feeling of Stress :   Social Connections:   . Frequency of Communication with Friends and Family:   . Frequency of Social Gatherings with Friends and Family:   . Attends Religious Services:   . Active Member of Clubs or Organizations:   . Attends Archivist Meetings:   Marland Kitchen Marital Status:   Intimate Partner Violence:   . Fear of Current or Ex-Partner:   . Emotionally Abused:   Marland Kitchen Physically Abused:   . Sexually Abused:     Family History  Problem Relation Age of Onset  . Heart failure Mother   . Atrial fibrillation Mother   .  Hypertension Mother   . Diabetes Father   . Heart attack Father   . Breast cancer Maternal Aunt 70  . Breast cancer Maternal Grandmother 80  . Breast cancer Cousin        2 mat cousins  . Hypertension Sister   . Hypertension Sister   . Arthritis Sister   . Leukemia Paternal Aunt   . Leukemia Paternal Uncle   . Brain cancer Paternal Aunt   . Other Brother 13       Drowning accident  . Liver cancer Maternal Uncle   . Alcohol abuse Maternal Uncle   . Prostate cancer Neg Hx   . Kidney cancer Neg Hx      Current Outpatient Medications:  .  acetaminophen (TYLENOL) 500 MG tablet, Take 1,000 mg by mouth every 6 (six) hours as needed (for pain.)., Disp: , Rfl:  .  amitriptyline (ELAVIL) 25 MG tablet, Take 2 tablets (50 mg total) by mouth  at bedtime., Disp: 90 tablet, Rfl: 0 .  bacitracin 500 UNIT/GM ointment, Apply 1 application topically 2 (two) times daily. Apply to affected area twice a day as directed., Disp: 15 g, Rfl: 1 .  benzocaine-resorcinol (VAGISIL) 5-2 % vaginal cream, Place 1 application vaginally daily as needed for itching or irritation., Disp: , Rfl:  .  cetirizine (ZYRTEC) 10 MG tablet, Take 10 mg by mouth daily as needed (allergies.). , Disp: , Rfl:  .  dexamethasone (DECADRON) 4 MG tablet, Take 2 tablets (8 mg total) by mouth daily. Start the day after carboplatin chemotherapy for 3 days., Disp: 30 tablet, Rfl: 1 .  diphenhydrAMINE (BENADRYL) 25 MG tablet, Take 25 mg by mouth daily as needed for itching or allergies., Disp: , Rfl:  .  fluconazole (DIFLUCAN) 150 MG tablet, Take 1 tablet (150 mg total) by mouth daily. Take 1 tablet (150 mg) by mouth. Three days later, take second tablet., Disp: 2 tablet, Rfl: 1 .  fluticasone (FLONASE) 50 MCG/ACT nasal spray, Place 2 sprays into both nostrils daily., Disp: 16 g, Rfl: 0 .  guaiFENesin (MUCINEX) 600 MG 12 hr tablet, Take 600 mg by mouth at bedtime as needed (congestion)., Disp: , Rfl:  .  hydrocortisone cream 1 %, Apply 1 application topically daily as needed for itching., Disp: , Rfl:  .  KLOR-CON M10 10 MEQ tablet, TAKE 1 TABLET (10 MEQ TOTAL) BY MOUTH 2 (TWO) TIMES DAILY., Disp: 60 tablet, Rfl: 0 .  lidocaine-prilocaine (EMLA) cream, Apply to affected area once (Patient taking differently: Apply 1 application topically daily as needed (prior to port being accessed.). ), Disp: 30 g, Rfl: 3 .  lisinopril (ZESTRIL) 5 MG tablet, Take 1 tablet (5 mg total) by mouth daily., Disp: 30 tablet, Rfl: 0 .  LORazepam (ATIVAN) 0.5 MG tablet, Take 1 tablet (0.5 mg total) by mouth every 6 (six) hours as needed (Nausea/vomiting or anxiety)., Disp: 90 tablet, Rfl: 0 .  magnesium chloride (SLOW-MAG) 64 MG TBEC SR tablet, Take 1 tablet (64 mg total) by mouth daily., Disp: 60 tablet, Rfl:  0 .  nitrofurantoin, macrocrystal-monohydrate, (MACROBID) 100 MG capsule, Take 1 capsule (100 mg total) by mouth 2 (two) times daily., Disp: 14 capsule, Rfl: 0 .  omeprazole (PRILOSEC) 20 MG capsule, Take 20 mg by mouth daily as needed (acid reflux)., Disp: , Rfl:  .  ondansetron (ZOFRAN) 8 MG tablet, Take 1 tablet (8 mg total) by mouth 2 (two) times daily as needed for refractory nausea / vomiting. Start on day 3  after carboplatin chemo., Disp: 30 tablet, Rfl: 1 .  oxyCODONE-acetaminophen (PERCOCET/ROXICET) 5-325 MG tablet, Take 1-2 tablets by mouth every 4 (four) hours as needed for moderate pain., Disp: 40 tablet, Rfl: 0 .  polyethylene glycol (MIRALAX / GLYCOLAX) 17 g packet, Take 17 g by mouth 2 (two) times daily., Disp: , Rfl:  .  prochlorperazine (COMPAZINE) 10 MG tablet, Take 1 tablet (10 mg total) by mouth every 6 (six) hours as needed (Nausea or vomiting)., Disp: 30 tablet, Rfl: 1 .  psyllium (METAMUCIL) 0.52 g capsule, Take 0.52 g by mouth in the morning and at bedtime., Disp: , Rfl:  .  rOPINIRole (REQUIP) 1 MG tablet, Take 1 tablet (1 mg total) by mouth at bedtime., Disp: 30 tablet, Rfl: 2 .  senna (SENOKOT) 8.6 MG tablet, Take 1 tablet by mouth daily as needed. , Disp: , Rfl:  .  triamcinolone cream (KENALOG) 0.1 %, Apply 1 application topically 2 (two) times daily. Apply to affected area twice a day as directed., Disp: 80 g, Rfl: 0  Physical exam:  Vitals:   11/17/19 0832  BP: (!) 153/85  Pulse: 83  Resp: 16  Temp: (!) 96.9 F (36.1 C)  SpO2: 100%  Weight: 180 lb 12.8 oz (82 kg)   Physical Exam Constitutional:      General: She is not in acute distress. Cardiovascular:     Rate and Rhythm: Normal rate and regular rhythm.     Heart sounds: Normal heart sounds.  Pulmonary:     Effort: Pulmonary effort is normal.     Breath sounds: Normal breath sounds.  Abdominal:     General: Bowel sounds are normal.     Palpations: Abdomen is soft.  Skin:    General: Skin is warm  and dry.  Neurological:     Mental Status: She is alert and oriented to person, place, and time.      CMP Latest Ref Rng & Units 11/17/2019  Glucose 70 - 99 mg/dL 97  BUN 8 - 23 mg/dL 10  Creatinine 0.44 - 1.00 mg/dL 0.59  Sodium 135 - 145 mmol/L 134(L)  Potassium 3.5 - 5.1 mmol/L 4.3  Chloride 98 - 111 mmol/L 102  CO2 22 - 32 mmol/L 26  Calcium 8.9 - 10.3 mg/dL 10.4(H)  Total Protein 6.5 - 8.1 g/dL 6.2(L)  Total Bilirubin 0.3 - 1.2 mg/dL 0.4  Alkaline Phos 38 - 126 U/L 83  AST 15 - 41 U/L 16  ALT 0 - 44 U/L 27   CBC Latest Ref Rng & Units 11/17/2019  WBC 4.0 - 10.5 K/uL 3.3(L)  Hemoglobin 12.0 - 15.0 g/dL 12.6  Hematocrit 36.0 - 46.0 % 37.6  Platelets 150 - 400 K/uL 221      Assessment and plan- Patient is a 74 y.o. 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   She is here for on treatment assessment prior to cycle 4 of adjuvant carbotaxol chemotherapy  Counts okay to proceed with adjuvant carbotaxol chemotherapy today with on for Neulasta support.I will see her back in 3 weeks time for cycle 3.  Patient reports mild tingling numbness in her extremities which is intermittent.  I will continue to observe the symptoms carefully.  She is still receiving Taxol at 175 mg per metered square.  Patient does report symptomatic improvement after receiving IV fluids once a week.  She will therefore come for IV fluidsIn 1 week and 2 weeks.  Neutropenia: Chronic even  prior to starting chemotherapy.  Her counts seem to be stable with on pro-Neulasta.  Continue to monitor  Restless leg syndrome: Continue Requip   Visit Diagnosis 1. Encounter for antineoplastic chemotherapy   2. 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HCC)   3. Chronic neutropenia (HCC)   4. Restless leg syndrome      Dr. Randa Evens, MD, MPH [facility] at [facility] [id number] 11/20/2019 7:48 AM

## 2019-11-24 ENCOUNTER — Inpatient Hospital Stay: Payer: Medicare Other | Attending: Oncology

## 2019-11-24 ENCOUNTER — Other Ambulatory Visit: Payer: Self-pay

## 2019-11-24 VITALS — BP 120/65 | HR 99 | Temp 97.7°F | Resp 17

## 2019-11-24 DIAGNOSIS — Z90710 Acquired absence of both cervix and uterus: Secondary | ICD-10-CM | POA: Diagnosis not present

## 2019-11-24 DIAGNOSIS — C561 Malignant neoplasm of right ovary: Secondary | ICD-10-CM

## 2019-11-24 DIAGNOSIS — Z5111 Encounter for antineoplastic chemotherapy: Secondary | ICD-10-CM | POA: Insufficient documentation

## 2019-11-24 DIAGNOSIS — Z90722 Acquired absence of ovaries, bilateral: Secondary | ICD-10-CM | POA: Insufficient documentation

## 2019-11-24 DIAGNOSIS — G620 Drug-induced polyneuropathy: Secondary | ICD-10-CM | POA: Insufficient documentation

## 2019-11-24 DIAGNOSIS — D701 Agranulocytosis secondary to cancer chemotherapy: Secondary | ICD-10-CM | POA: Diagnosis not present

## 2019-11-24 DIAGNOSIS — T451X5A Adverse effect of antineoplastic and immunosuppressive drugs, initial encounter: Secondary | ICD-10-CM | POA: Diagnosis not present

## 2019-11-24 MED ORDER — HEPARIN SOD (PORK) LOCK FLUSH 100 UNIT/ML IV SOLN
500.0000 [IU] | Freq: Once | INTRAVENOUS | Status: AC
  Administered 2019-11-24: 13:00:00 500 [IU] via INTRAVENOUS
  Filled 2019-11-24: qty 5

## 2019-11-24 MED ORDER — SODIUM CHLORIDE 0.9 % IV SOLN
Freq: Once | INTRAVENOUS | Status: AC
  Filled 2019-11-24: qty 250

## 2019-11-27 ENCOUNTER — Other Ambulatory Visit: Payer: Self-pay | Admitting: Nurse Practitioner

## 2019-11-28 ENCOUNTER — Other Ambulatory Visit: Payer: Self-pay | Admitting: Nurse Practitioner

## 2019-11-28 DIAGNOSIS — K521 Toxic gastroenteritis and colitis: Secondary | ICD-10-CM

## 2019-11-30 ENCOUNTER — Other Ambulatory Visit: Payer: Self-pay | Admitting: Family Medicine

## 2019-11-30 DIAGNOSIS — K521 Toxic gastroenteritis and colitis: Secondary | ICD-10-CM

## 2019-12-01 ENCOUNTER — Other Ambulatory Visit: Payer: Self-pay

## 2019-12-01 ENCOUNTER — Inpatient Hospital Stay: Payer: Medicare Other

## 2019-12-01 VITALS — BP 144/72 | HR 84 | Temp 96.8°F | Resp 18

## 2019-12-01 DIAGNOSIS — T451X5A Adverse effect of antineoplastic and immunosuppressive drugs, initial encounter: Secondary | ICD-10-CM | POA: Diagnosis not present

## 2019-12-01 DIAGNOSIS — Z90710 Acquired absence of both cervix and uterus: Secondary | ICD-10-CM | POA: Diagnosis not present

## 2019-12-01 DIAGNOSIS — G620 Drug-induced polyneuropathy: Secondary | ICD-10-CM | POA: Diagnosis not present

## 2019-12-01 DIAGNOSIS — C561 Malignant neoplasm of right ovary: Secondary | ICD-10-CM | POA: Diagnosis not present

## 2019-12-01 DIAGNOSIS — D701 Agranulocytosis secondary to cancer chemotherapy: Secondary | ICD-10-CM | POA: Diagnosis not present

## 2019-12-01 DIAGNOSIS — Z95828 Presence of other vascular implants and grafts: Secondary | ICD-10-CM

## 2019-12-01 DIAGNOSIS — Z5111 Encounter for antineoplastic chemotherapy: Secondary | ICD-10-CM | POA: Diagnosis not present

## 2019-12-01 MED ORDER — HEPARIN SOD (PORK) LOCK FLUSH 100 UNIT/ML IV SOLN
500.0000 [IU] | Freq: Once | INTRAVENOUS | Status: AC
  Administered 2019-12-01: 500 [IU] via INTRAVENOUS
  Filled 2019-12-01: qty 5

## 2019-12-01 MED ORDER — HEPARIN SOD (PORK) LOCK FLUSH 100 UNIT/ML IV SOLN
INTRAVENOUS | Status: AC
  Filled 2019-12-01: qty 5

## 2019-12-01 MED ORDER — SODIUM CHLORIDE 0.9 % IV SOLN
Freq: Once | INTRAVENOUS | Status: AC
  Filled 2019-12-01: qty 250

## 2019-12-01 NOTE — Progress Notes (Signed)
Pharmacist Chemotherapy Monitoring - Follow Up Assessment    I verify that I have reviewed each item in the below checklist:  . Regimen for the patient is scheduled for the appropriate day and plan matches scheduled date. Marland Kitchen Appropriate non-routine labs are ordered dependent on drug ordered. . If applicable, additional medications reviewed and ordered per protocol based on lifetime cumulative doses and/or treatment regimen.   Plan for follow-up and/or issues identified: No . I-vent associated with next due treatment: No . MD and/or nursing notified: No  Terrius Gentile K 12/01/2019 8:26 AM

## 2019-12-05 ENCOUNTER — Other Ambulatory Visit: Payer: Self-pay | Admitting: Nurse Practitioner

## 2019-12-05 DIAGNOSIS — K521 Toxic gastroenteritis and colitis: Secondary | ICD-10-CM

## 2019-12-07 ENCOUNTER — Other Ambulatory Visit: Payer: Self-pay | Admitting: Nurse Practitioner

## 2019-12-07 DIAGNOSIS — T451X5A Adverse effect of antineoplastic and immunosuppressive drugs, initial encounter: Secondary | ICD-10-CM

## 2019-12-07 DIAGNOSIS — K521 Toxic gastroenteritis and colitis: Secondary | ICD-10-CM

## 2019-12-07 MED ORDER — AMITRIPTYLINE HCL 25 MG PO TABS: 50.0000 mg | ORAL_TABLET | Freq: Every day | ORAL | 0 refills | Status: DC

## 2019-12-07 MED ORDER — SLOW-MAG 71.5-119 MG PO TBEC: 1.0000 | DELAYED_RELEASE_TABLET | Freq: Every day | ORAL | 0 refills | Status: DC

## 2019-12-07 MED ORDER — LISINOPRIL 5 MG PO TABS: 5.0000 mg | ORAL_TABLET | Freq: Every day | ORAL | 0 refills | Status: DC

## 2019-12-07 MED ORDER — POTASSIUM CHLORIDE CRYS ER 10 MEQ PO TBCR: 10.0000 meq | EXTENDED_RELEASE_TABLET | Freq: Two times a day (BID) | ORAL | 0 refills | Status: DC

## 2019-12-08 ENCOUNTER — Inpatient Hospital Stay: Payer: Medicare Other

## 2019-12-08 ENCOUNTER — Encounter: Payer: Self-pay | Admitting: Oncology

## 2019-12-08 ENCOUNTER — Other Ambulatory Visit: Payer: Self-pay

## 2019-12-08 ENCOUNTER — Inpatient Hospital Stay (HOSPITAL_BASED_OUTPATIENT_CLINIC_OR_DEPARTMENT_OTHER): Payer: Medicare Other | Admitting: Oncology

## 2019-12-08 VITALS — BP 101/69 | HR 81 | Temp 98.2°F | Resp 16 | Wt 177.3 lb

## 2019-12-08 DIAGNOSIS — C561 Malignant neoplasm of right ovary: Secondary | ICD-10-CM

## 2019-12-08 DIAGNOSIS — Z5111 Encounter for antineoplastic chemotherapy: Secondary | ICD-10-CM

## 2019-12-08 DIAGNOSIS — D701 Agranulocytosis secondary to cancer chemotherapy: Secondary | ICD-10-CM

## 2019-12-08 DIAGNOSIS — T451X5A Adverse effect of antineoplastic and immunosuppressive drugs, initial encounter: Secondary | ICD-10-CM | POA: Diagnosis not present

## 2019-12-08 DIAGNOSIS — Z90710 Acquired absence of both cervix and uterus: Secondary | ICD-10-CM | POA: Diagnosis not present

## 2019-12-08 DIAGNOSIS — G620 Drug-induced polyneuropathy: Secondary | ICD-10-CM | POA: Diagnosis not present

## 2019-12-08 DIAGNOSIS — Z95828 Presence of other vascular implants and grafts: Secondary | ICD-10-CM

## 2019-12-08 LAB — CBC WITH DIFFERENTIAL/PLATELET
Abs Immature Granulocytes: 0.02 10*3/uL (ref 0.00–0.07)
Basophils Absolute: 0 10*3/uL (ref 0.0–0.1)
Basophils Relative: 0 %
Eosinophils Absolute: 0.1 10*3/uL (ref 0.0–0.5)
Eosinophils Relative: 2 %
HCT: 37.7 % (ref 36.0–46.0)
Hemoglobin: 12.9 g/dL (ref 12.0–15.0)
Immature Granulocytes: 1 %
Lymphocytes Relative: 46 %
Lymphs Abs: 1.6 10*3/uL (ref 0.7–4.0)
MCH: 31.7 pg (ref 26.0–34.0)
MCHC: 34.2 g/dL (ref 30.0–36.0)
MCV: 92.6 fL (ref 80.0–100.0)
Monocytes Absolute: 0.6 10*3/uL (ref 0.1–1.0)
Monocytes Relative: 18 %
Neutro Abs: 1.1 10*3/uL — ABNORMAL LOW (ref 1.7–7.7)
Neutrophils Relative %: 33 %
Platelets: 237 10*3/uL (ref 150–400)
RBC: 4.07 MIL/uL (ref 3.87–5.11)
RDW: 17 % — ABNORMAL HIGH (ref 11.5–15.5)
Smear Review: NORMAL
WBC: 3.4 10*3/uL — ABNORMAL LOW (ref 4.0–10.5)
nRBC: 0 % (ref 0.0–0.2)

## 2019-12-08 LAB — COMPREHENSIVE METABOLIC PANEL
ALT: 25 U/L (ref 0–44)
AST: 17 U/L (ref 15–41)
Albumin: 3.8 g/dL (ref 3.5–5.0)
Alkaline Phosphatase: 99 U/L (ref 38–126)
Anion gap: 7 (ref 5–15)
BUN: 13 mg/dL (ref 8–23)
CO2: 24 mmol/L (ref 22–32)
Calcium: 10.6 mg/dL — ABNORMAL HIGH (ref 8.9–10.3)
Chloride: 103 mmol/L (ref 98–111)
Creatinine, Ser: 0.74 mg/dL (ref 0.44–1.00)
GFR calc Af Amer: >60 mL/min (ref >60)
GFR calc non Af Amer: >60 mL/min (ref >60)
Glucose, Bld: 101 mg/dL — ABNORMAL HIGH (ref 70–99)
Potassium: 4.4 mmol/L (ref 3.5–5.1)
Sodium: 134 mmol/L — ABNORMAL LOW (ref 135–145)
Total Bilirubin: 0.7 mg/dL (ref 0.3–1.2)
Total Protein: 6.5 g/dL (ref 6.5–8.1)

## 2019-12-08 MED ORDER — SODIUM CHLORIDE 0.9 % IV SOLN
354.8000 mg | Freq: Once | INTRAVENOUS | Status: AC
  Administered 2019-12-08: 350 mg via INTRAVENOUS
  Filled 2019-12-08: qty 35

## 2019-12-08 MED ORDER — SODIUM CHLORIDE 0.9 % IV SOLN
150.0000 mg | Freq: Once | INTRAVENOUS | Status: AC
  Administered 2019-12-08: 150 mg via INTRAVENOUS
  Filled 2019-12-08: qty 150

## 2019-12-08 MED ORDER — SODIUM CHLORIDE 0.9 % IV SOLN
10.0000 mg | Freq: Once | INTRAVENOUS | Status: AC
  Administered 2019-12-08: 10 mg via INTRAVENOUS
  Filled 2019-12-08: qty 10

## 2019-12-08 MED ORDER — PALONOSETRON HCL INJECTION 0.25 MG/5ML
0.2500 mg | Freq: Once | INTRAVENOUS | Status: AC
  Administered 2019-12-08: 0.25 mg via INTRAVENOUS
  Filled 2019-12-08: qty 5

## 2019-12-08 MED ORDER — SODIUM CHLORIDE 0.9 % IV SOLN
Freq: Once | INTRAVENOUS | Status: AC
  Filled 2019-12-08: qty 250

## 2019-12-08 MED ORDER — DIPHENHYDRAMINE HCL 50 MG/ML IJ SOLN
50.0000 mg | Freq: Once | INTRAMUSCULAR | Status: AC
  Administered 2019-12-08: 50 mg via INTRAVENOUS
  Filled 2019-12-08: qty 1

## 2019-12-08 MED ORDER — PEGFILGRASTIM 6 MG/0.6ML ~~LOC~~ PSKT
6.0000 mg | PREFILLED_SYRINGE | Freq: Once | SUBCUTANEOUS | Status: AC
  Administered 2019-12-08: 6 mg via SUBCUTANEOUS
  Filled 2019-12-08: qty 0.6

## 2019-12-08 MED ORDER — SODIUM CHLORIDE 0.9% FLUSH
10.0000 mL | INTRAVENOUS | Status: DC | PRN
  Administered 2019-12-08: 10 mL via INTRAVENOUS
  Filled 2019-12-08: qty 10

## 2019-12-08 MED ORDER — HEPARIN SOD (PORK) LOCK FLUSH 100 UNIT/ML IV SOLN
500.0000 [IU] | Freq: Once | INTRAVENOUS | Status: AC | PRN
  Administered 2019-12-08: 500 [IU]
  Filled 2019-12-08: qty 5

## 2019-12-08 MED ORDER — HEPARIN SOD (PORK) LOCK FLUSH 100 UNIT/ML IV SOLN
INTRAVENOUS | Status: AC
  Filled 2019-12-08: qty 5

## 2019-12-08 MED ORDER — SODIUM CHLORIDE 0.9 % IV SOLN
175.0000 mg/m2 | Freq: Once | INTRAVENOUS | Status: AC
  Administered 2019-12-08: 336 mg via INTRAVENOUS
  Filled 2019-12-08: qty 56

## 2019-12-08 MED ORDER — FAMOTIDINE IN NACL 20-0.9 MG/50ML-% IV SOLN
20.0000 mg | Freq: Once | INTRAVENOUS | Status: AC
  Administered 2019-12-08: 20 mg via INTRAVENOUS
  Filled 2019-12-08: qty 50

## 2019-12-08 NOTE — Progress Notes (Signed)
Patient here for oncology follow-up appointment, complaints of port irritation/ discomfort and numbness in fingers. MD notified.

## 2019-12-08 NOTE — Progress Notes (Signed)
ANC 1.1, per Dr. Janese Banks okay to proceed with scheduled treatment at this time.

## 2019-12-10 NOTE — Progress Notes (Signed)
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 This was followed by an MR pelvis with and without contrast in December 2020 which showed a large cystic mass arising from the right ovary measuring 1.8 cm. This was followed by TAH/BSO with peritoneal biopsies with pelvic/aortic lymph node dissection pelvic washings and omentectomy with Dr. Fransisca Connors and Dr. Georgianne Fick on 08/19/2019. 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 Angiolymphatic invasion is present. Bilateral fallopian tubes were negative for atypia and malignancy. Peritoneal stone excision negative for malignancy. Uterus with cervix negative for atypia and malignancy. Bladder, right gutter negative for malignancy. 5 lymph nodes were sampled and were negative for malignancy. Pelvic washings were also negative for malignancy. Pathologic staging FIGO1 C2  Patient was seen by Dr. Fransisca Connors postoperatively and recommendation was to proceed with adjuvant chemotherapy carbotaxol for 6 cycles.   Interval history-patient reports mild tingling numbness in a few fingers which is intermittent.  Restless leg symptoms are currently well controlled.  Reports  ongoing fatigue.  Denies other complaints  ECOG PS- 1 Pain scale- 0  Review of systems- Review of Systems  Constitutional: Positive for malaise/fatigue. Negative for chills, fever and weight loss.  HENT: Negative for congestion, ear discharge and nosebleeds.   Eyes: Negative for blurred vision.  Respiratory: Negative for cough, hemoptysis, sputum production, shortness of breath and wheezing.   Cardiovascular: Negative for chest pain, palpitations, orthopnea and claudication.  Gastrointestinal: Negative for abdominal pain, blood in stool, constipation, diarrhea, heartburn, melena, nausea and vomiting.  Genitourinary: Negative for dysuria, flank pain, frequency, hematuria and urgency.  Musculoskeletal: Negative for back pain, joint pain and myalgias.  Skin: Negative for rash.  Neurological: Positive for sensory change (Peripheral neuropathy). Negative for dizziness, tingling, focal weakness, seizures, weakness and headaches.  Endo/Heme/Allergies: Does not bruise/bleed easily.  Psychiatric/Behavioral: Negative for depression and suicidal ideas. The patient does not have insomnia.       Allergies  Allergen Reactions  . Citalopram Nausea And Vomiting  . Ivp Dye [Iodinated Diagnostic Agents] Hives and Swelling    hives  . Meloxicam     unknown  . Adhesive [Tape] Rash  . Amlodipine Rash  . Ampicillin Rash    Did it involve swelling of the face/tongue/throat, SOB, or low BP? No Did it involve sudden or severe rash/hives, skin peeling, or any reaction on the inside of your mouth or nose? Yes Did you need to seek medical attention at a hospital or doctor's office? Yes When did it last happen? 10 + years If all above answers are "NO", may proceed with cephalosporin use.  . Cefoxitin Rash  . Estradiol Rash  . Estropipate Rash  . Olmesartan Medoxomil-Hctz Rash  . Sertraline Rash       . Sulfa Antibiotics Rash     Past Medical History:  Diagnosis Date  .  Anxiety   .  Arthritis   . Family history of breast cancer   . Family history of leukemia   . GERD (gastroesophageal reflux disease)   . History of ischemic colitis 09/02/2017  . Hx of colonic polyp   . Hypertension    H/O-PT TAKEN OFF BY PCP AS OF 2019  . IBS (irritable bowel syndrome)   . Ovarian cancer ([place])   . Ovarian cyst   . Vaginal atrophy      Past Surgical History:  Procedure Laterality Date  . BREAST BIOPSY Left    needle bx/clip-neg  . CERVICAL BIOPSY  W/ LOOP ELECTRODE EXCISION    . CHOLECYSTECTOMY    . Colonoscopoy  2007, 2008, 2011  . COLONOSCOPY WITH PROPOFOL N/A 05/05/2018   Procedure: COLONOSCOPY WITH PROPOFOL;  Surgeon: Lin Landsman, MD;  Location: [facility] ENDOSCOPY;  Service: Gastroenterology;  Laterality: N/A;  . HYSTERECTOMY ABDOMINAL WITH SALPINGO-OOPHORECTOMY N/A 08/19/2019   Procedure: HYSTERECTOMY ABDOMINAL WITH SALPINGO-OOPHORECTOMY WITH PELVIC WASHINGS PERITONEAL BIOPSIES AND PARA AORTIC LYMPH NODE DISSECTION;  Surgeon: [name], MD;  Location: [facility] ORS;  Service: Gynecology;  Laterality: N/A;  . LEEP    . OMENTECTOMY N/A 08/19/2019   Procedure: OMENTECTOMY;  Surgeon: [name], MD;  Location: [facility] ORS;  Service: Gynecology;  Laterality: N/A;  . PORTA CATH INSERTION N/A 09/11/2019   Procedure: PORTA CATH INSERTION;  Surgeon: Algernon Huxley, MD;  Location: [facility];  Service: Cardiovascular;  Laterality: N/A;  . TUBAL LIGATION  1999    Social History   Socioeconomic History  . Marital status: Single    Spouse name: Not on file  . Number of children: 0  . Years of education: Not on file  . Highest education level: Associate degree: academic program  Occupational History  . Occupation: Retired  Tobacco Use  . Smoking status: Never Smoker  . Smokeless tobacco: Never Used  . Tobacco comment: smoking cessation materials not required  Substance and Sexual Activity  . Alcohol use: Yes    Comment: rarely  . Drug use: No  . Sexual  activity: Never    Birth control/protection: Post-menopausal  Other Topics Concern  . Not on file  Social History Narrative  . Not on file   Social Determinants of Health   Financial Resource Strain:   . Difficulty of Paying Living Expenses:   Food Insecurity:   . Worried About Charity fundraiser in the Last Year:   . Arboriculturist in the Last Year:   Transportation Needs:   . Film/video editor (Medical):   Marland Kitchen Lack of Transportation (Non-Medical):   Physical Activity:   . Days of Exercise per Week:   . Minutes of Exercise per Session:   Stress:   . Feeling of Stress :   Social Connections:   . Frequency of Communication with Friends and Family:   . Frequency of Social Gatherings with Friends and Family:   . Attends Religious Services:   . Active Member of Clubs or Organizations:   . Attends Archivist Meetings:   Marland Kitchen Marital Status:   Intimate Partner Violence:   . Fear of Current or Ex-Partner:   . Emotionally Abused:   Marland Kitchen Physically Abused:   . Sexually Abused:     Family History  Problem Relation Age of Onset  . Heart failure Mother   . Atrial fibrillation Mother   . Hypertension Mother   . Diabetes Father   . Heart attack  Father   . Breast cancer Maternal Aunt 70  . Breast cancer Maternal Grandmother 80  . Breast cancer Cousin        2 mat cousins  . Hypertension Sister   . Hypertension Sister   . Arthritis Sister   . Leukemia Paternal Aunt   . Leukemia Paternal Uncle   . Brain cancer Paternal Aunt   . Other Brother 13       Drowning accident  . Liver cancer Maternal Uncle   . Alcohol abuse Maternal Uncle   . Prostate cancer Neg Hx   . Kidney cancer Neg Hx      Current Outpatient Medications:  .  acetaminophen (TYLENOL) 500 MG tablet, Take 1,000 mg by mouth every 6 (six) hours as needed (for pain.)., Disp: , Rfl:  .  dexamethasone (DECADRON) 4 MG tablet, Take 2 tablets (8 mg total) by mouth daily. Start the day after carboplatin  chemotherapy for 3 days., Disp: 30 tablet, Rfl: 1 .  hydrocortisone cream 1 %, Apply 1 application topically daily as needed for itching., Disp: , Rfl:  .  lidocaine-prilocaine (EMLA) cream, Apply to affected area once (Patient taking differently: Apply 1 application topically daily as needed (prior to port being accessed.). ), Disp: 30 g, Rfl: 3 .  lisinopril (ZESTRIL) 5 MG tablet, Take 1 tablet (5 mg total) by mouth daily., Disp: 30 tablet, Rfl: 0 .  LORazepam (ATIVAN) 0.5 MG tablet, Take 1 tablet (0.5 mg total) by mouth every 6 (six) hours as needed (Nausea/vomiting or anxiety)., Disp: 90 tablet, Rfl: 0 .  magnesium chloride (SLOW-MAG) 64 MG TBEC SR tablet, Take 1 tablet (64 mg total) by mouth daily., Disp: 60 tablet, Rfl: 0 .  omeprazole (PRILOSEC) 20 MG capsule, Take 20 mg by mouth daily as needed (acid reflux)., Disp: , Rfl:  .  ondansetron (ZOFRAN) 8 MG tablet, Take 1 tablet (8 mg total) by mouth 2 (two) times daily as needed for refractory nausea / vomiting. Start on day 3 after carboplatin chemo., Disp: 30 tablet, Rfl: 1 .  oxyCODONE-acetaminophen (PERCOCET/ROXICET) 5-325 MG tablet, Take 1-2 tablets by mouth every 4 (four) hours as needed for moderate pain., Disp: 40 tablet, Rfl: 0 .  polyethylene glycol (MIRALAX / GLYCOLAX) 17 g packet, Take 17 g by mouth 2 (two) times daily., Disp: , Rfl:  .  potassium chloride (KLOR-CON M10) 10 MEQ tablet, Take 1 tablet (10 mEq total) by mouth 2 (two) times daily., Disp: 60 tablet, Rfl: 0 .  prochlorperazine (COMPAZINE) 10 MG tablet, Take 1 tablet (10 mg total) by mouth every 6 (six) hours as needed (Nausea or vomiting)., Disp: 30 tablet, Rfl: 1 .  rOPINIRole (REQUIP) 1 MG tablet, Take 1 tablet (1 mg total) by mouth at bedtime., Disp: 30 tablet, Rfl: 2 .  senna (SENOKOT) 8.6 MG tablet, Take 1 tablet by mouth daily as needed. , Disp: , Rfl:  .  amitriptyline (ELAVIL) 25 MG tablet, Take 2 tablets (50 mg total) by mouth at bedtime. (Patient not taking:  Reported on 12/08/2019), Disp: 90 tablet, Rfl: 0 .  bacitracin 500 UNIT/GM ointment, Apply 1 application topically 2 (two) times daily. Apply to affected area twice a day as directed. (Patient not taking: Reported on 12/08/2019), Disp: 15 g, Rfl: 1 .  benzocaine-resorcinol (VAGISIL) 5-2 % vaginal cream, Place 1 application vaginally daily as needed for itching or irritation., Disp: , Rfl:  .  cetirizine (ZYRTEC) 10 MG tablet, Take 10 mg by mouth daily as needed (allergies.). ,  Disp: , Rfl:  .  diphenhydrAMINE (BENADRYL) 25 MG tablet, Take 25 mg by mouth daily as needed for itching or allergies., Disp: , Rfl:  .  fluconazole (DIFLUCAN) 150 MG tablet, Take 1 tablet (150 mg total) by mouth daily. Take 1 tablet (150 mg) by mouth. Three days later, take second tablet. (Patient not taking: Reported on 12/08/2019), Disp: 2 tablet, Rfl: 1 .  fluticasone (FLONASE) 50 MCG/ACT nasal spray, Place 2 sprays into both nostrils daily. (Patient not taking: Reported on 12/08/2019), Disp: 16 g, Rfl: 0 .  guaiFENesin (MUCINEX) 600 MG 12 hr tablet, Take 600 mg by mouth at bedtime as needed (congestion)., Disp: , Rfl:  .  nitrofurantoin, macrocrystal-monohydrate, (MACROBID) 100 MG capsule, Take 1 capsule (100 mg total) by mouth 2 (two) times daily. (Patient not taking: Reported on 12/08/2019), Disp: 14 capsule, Rfl: 0 .  psyllium (METAMUCIL) 0.52 g capsule, Take 0.52 g by mouth in the morning and at bedtime., Disp: , Rfl:  .  triamcinolone cream (KENALOG) 0.1 %, Apply 1 application topically 2 (two) times daily. Apply to affected area twice a day as directed. (Patient not taking: Reported on 12/08/2019), Disp: 80 g, Rfl: 0  Physical exam:  Vitals:   12/08/19 0858  BP: 101/69  Pulse: 81  Resp: 16  Temp: 98.2 F (36.8 C)  TempSrc: Tympanic  SpO2: 100%  Weight: 177 lb 4.8 oz (80.4 kg)   Physical Exam Constitutional:      General: She is not in acute distress. Cardiovascular:     Rate and Rhythm: Normal rate and regular  rhythm.     Heart sounds: Normal heart sounds.  Pulmonary:     Effort: Pulmonary effort is normal.     Breath sounds: Normal breath sounds.  Abdominal:     General: Bowel sounds are normal.     Palpations: Abdomen is soft.  Skin:    General: Skin is warm and dry.  Neurological:     Mental Status: She is alert and oriented to person, place, and time.      CMP Latest Ref Rng & Units 12/08/2019  Glucose 70 - 99 mg/dL 101(H)  BUN 8 - 23 mg/dL 13  Creatinine 0.44 - 1.00 mg/dL 0.74  Sodium 135 - 145 mmol/L 134(L)  Potassium 3.5 - 5.1 mmol/L 4.4  Chloride 98 - 111 mmol/L 103  CO2 22 - 32 mmol/L 24  Calcium 8.9 - 10.3 mg/dL 10.6(H)  Total Protein 6.5 - 8.1 g/dL 6.5  Total Bilirubin 0.3 - 1.2 mg/dL 0.7  Alkaline Phos 38 - 126 U/L 99  AST 15 - 41 U/L 17  ALT 0 - 44 U/L 25   CBC Latest Ref Rng & Units 12/08/2019  WBC 4.0 - 10.5 K/uL 3.4(L)  Hemoglobin 12.0 - 15.0 g/dL 12.9  Hematocrit 36.0 - 46.0 % 37.7  Platelets 150 - 400 K/uL 237      Assessment and plan- Patient is a 74 y.o. 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  She is here for on treatment assessment prior to cycle 5 of adjuvant carbotaxol chemotherapy  Counts okay to proceed with cycle 5 of adjuvant carbotaxol chemotherapy today.  She does have a mild leukopenia today with a white cell count of 3.4 and ANC of 1.1.However she will be receiving on for Neulasta support.  She is also receiving carboplatin and produced AUC of 4.  Overall patient is tolerating chemotherapy well without any significant side effects.  Patient does have mild grade 1 Taxol-induced peripheral neuropathy which is overall stable.  Continue to monitor  I will see her back in 3 weeks time for cycle 6 of adjuvant carbotaxol chemotherapy and will plan to check a CA-125 at that time  Patient does report symptomatic improvement with weekly IV fluids and will therefore receive with next week and the week  after.   Visit Diagnosis 1. Encounter for antineoplastic chemotherapy   2. Chemotherapy-induced peripheral neuropathy ([place])   3. Chemotherapy induced neutropenia ([facility])      Dr. Randa Evens, MD, MPH [facility] at [facility] [id number] 12/10/2019 8:51 AM

## 2019-12-15 ENCOUNTER — Other Ambulatory Visit: Payer: Self-pay

## 2019-12-15 ENCOUNTER — Inpatient Hospital Stay: Payer: Medicare Other

## 2019-12-15 VITALS — BP 129/68 | HR 84 | Temp 99.3°F | Resp 18

## 2019-12-15 DIAGNOSIS — D701 Agranulocytosis secondary to cancer chemotherapy: Secondary | ICD-10-CM | POA: Diagnosis not present

## 2019-12-15 DIAGNOSIS — T451X5A Adverse effect of antineoplastic and immunosuppressive drugs, initial encounter: Secondary | ICD-10-CM | POA: Diagnosis not present

## 2019-12-15 DIAGNOSIS — C561 Malignant neoplasm of right ovary: Secondary | ICD-10-CM

## 2019-12-15 DIAGNOSIS — Z90710 Acquired absence of both cervix and uterus: Secondary | ICD-10-CM | POA: Diagnosis not present

## 2019-12-15 DIAGNOSIS — Z5111 Encounter for antineoplastic chemotherapy: Secondary | ICD-10-CM | POA: Diagnosis not present

## 2019-12-15 DIAGNOSIS — G620 Drug-induced polyneuropathy: Secondary | ICD-10-CM | POA: Diagnosis not present

## 2019-12-15 MED ORDER — SODIUM CHLORIDE 0.9 % IV SOLN
Freq: Once | INTRAVENOUS | Status: AC
  Filled 2019-12-15: qty 250

## 2019-12-15 MED ORDER — HEPARIN SOD (PORK) LOCK FLUSH 100 UNIT/ML IV SOLN
500.0000 [IU] | Freq: Once | INTRAVENOUS | Status: AC
  Administered 2019-12-15: 500 [IU] via INTRAVENOUS
  Filled 2019-12-15: qty 5

## 2019-12-22 ENCOUNTER — Other Ambulatory Visit: Payer: Self-pay | Admitting: Oncology

## 2019-12-22 ENCOUNTER — Other Ambulatory Visit: Payer: Self-pay

## 2019-12-22 ENCOUNTER — Other Ambulatory Visit: Payer: Self-pay | Admitting: *Deleted

## 2019-12-22 ENCOUNTER — Inpatient Hospital Stay: Payer: Medicare Other

## 2019-12-22 ENCOUNTER — Telehealth: Payer: Self-pay | Admitting: *Deleted

## 2019-12-22 ENCOUNTER — Inpatient Hospital Stay: Payer: Medicare Other | Attending: Oncology

## 2019-12-22 DIAGNOSIS — D701 Agranulocytosis secondary to cancer chemotherapy: Secondary | ICD-10-CM | POA: Diagnosis not present

## 2019-12-22 DIAGNOSIS — F419 Anxiety disorder, unspecified: Secondary | ICD-10-CM | POA: Insufficient documentation

## 2019-12-22 DIAGNOSIS — Z7952 Long term (current) use of systemic steroids: Secondary | ICD-10-CM | POA: Insufficient documentation

## 2019-12-22 DIAGNOSIS — N390 Urinary tract infection, site not specified: Secondary | ICD-10-CM | POA: Diagnosis not present

## 2019-12-22 DIAGNOSIS — B9620 Unspecified Escherichia coli [E. coli] as the cause of diseases classified elsewhere: Secondary | ICD-10-CM | POA: Diagnosis not present

## 2019-12-22 DIAGNOSIS — Z5111 Encounter for antineoplastic chemotherapy: Secondary | ICD-10-CM | POA: Insufficient documentation

## 2019-12-22 DIAGNOSIS — Z800 Family history of malignant neoplasm of digestive organs: Secondary | ICD-10-CM | POA: Insufficient documentation

## 2019-12-22 DIAGNOSIS — C561 Malignant neoplasm of right ovary: Secondary | ICD-10-CM | POA: Diagnosis present

## 2019-12-22 DIAGNOSIS — Z803 Family history of malignant neoplasm of breast: Secondary | ICD-10-CM | POA: Insufficient documentation

## 2019-12-22 DIAGNOSIS — Z9079 Acquired absence of other genital organ(s): Secondary | ICD-10-CM | POA: Insufficient documentation

## 2019-12-22 DIAGNOSIS — Z79899 Other long term (current) drug therapy: Secondary | ICD-10-CM | POA: Diagnosis not present

## 2019-12-22 DIAGNOSIS — G2581 Restless legs syndrome: Secondary | ICD-10-CM | POA: Insufficient documentation

## 2019-12-22 DIAGNOSIS — Z806 Family history of leukemia: Secondary | ICD-10-CM | POA: Diagnosis not present

## 2019-12-22 DIAGNOSIS — Z5189 Encounter for other specified aftercare: Secondary | ICD-10-CM | POA: Insufficient documentation

## 2019-12-22 DIAGNOSIS — Z90710 Acquired absence of both cervix and uterus: Secondary | ICD-10-CM | POA: Diagnosis not present

## 2019-12-22 DIAGNOSIS — Z90722 Acquired absence of ovaries, bilateral: Secondary | ICD-10-CM | POA: Diagnosis not present

## 2019-12-22 DIAGNOSIS — R309 Painful micturition, unspecified: Secondary | ICD-10-CM

## 2019-12-22 DIAGNOSIS — T451X5A Adverse effect of antineoplastic and immunosuppressive drugs, initial encounter: Secondary | ICD-10-CM | POA: Insufficient documentation

## 2019-12-22 DIAGNOSIS — R300 Dysuria: Secondary | ICD-10-CM

## 2019-12-22 DIAGNOSIS — G620 Drug-induced polyneuropathy: Secondary | ICD-10-CM | POA: Diagnosis not present

## 2019-12-22 LAB — URINALYSIS, COMPLETE (UACMP) WITH MICROSCOPIC
Bilirubin Urine: NEGATIVE
Glucose, UA: NEGATIVE mg/dL
Ketones, ur: NEGATIVE mg/dL
Nitrite: POSITIVE — AB
Protein, ur: 100 mg/dL — AB
RBC / HPF: >50 RBC/hpf — ABNORMAL HIGH (ref 0–5)
Specific Gravity, Urine: 1.015 (ref 1.005–1.030)
WBC, UA: >50 WBC/hpf — ABNORMAL HIGH (ref 0–5)
pH: 7 (ref 5.0–8.0)

## 2019-12-22 MED ORDER — CIPROFLOXACIN HCL 500 MG PO TABS
500.0000 mg | ORAL_TABLET | Freq: Two times a day (BID) | ORAL | 0 refills | Status: DC
Start: 2019-12-22 — End: 2020-01-20

## 2019-12-22 MED ORDER — FLUCONAZOLE 150 MG PO TABS: 150.0000 mg | ORAL_TABLET | Freq: Every day | ORAL | 0 refills | Status: DC

## 2019-12-22 NOTE — Telephone Encounter (Signed)
Patient called reporting that she is not going to make her infusion appointment today due to developing a painful UTI yesterday. She states this is the second one in 6 weeks and that she is having burning with urination and that it is getting worse and it is very painful. She reports that she took AZO twice yesterday. She is asking if she can come in for a lab check today to have her urine checked out. Please advise

## 2019-12-22 NOTE — Telephone Encounter (Signed)
Patient will come in at 1130 for urine sample.

## 2019-12-22 NOTE — Progress Notes (Signed)
Pharmacist Chemotherapy Monitoring - Follow Up Assessment    I verify that I have reviewed each item in the below checklist:  . Regimen for the patient is scheduled for the appropriate day and plan matches scheduled date. Marland Kitchen Appropriate non-routine labs are ordered dependent on drug ordered. . If applicable, additional medications reviewed and ordered per protocol based on lifetime cumulative doses and/or treatment regimen.   Plan for follow-up and/or issues identified: No . I-vent associated with next due treatment: No . MD and/or nursing notified: No  Carrie Roberson K 12/22/2019 8:48 AM

## 2019-12-22 NOTE — Telephone Encounter (Signed)
Yes will repeat urine analysis and urine culture today before giving antibiotics. Please ask her to come to drop off the sample

## 2019-12-24 LAB — URINE CULTURE: Culture: >=100000 — AB

## 2019-12-25 ENCOUNTER — Other Ambulatory Visit: Payer: Self-pay | Admitting: Oncology

## 2019-12-29 ENCOUNTER — Encounter: Payer: Self-pay | Admitting: Oncology

## 2019-12-29 ENCOUNTER — Inpatient Hospital Stay (HOSPITAL_BASED_OUTPATIENT_CLINIC_OR_DEPARTMENT_OTHER): Payer: Medicare Other | Admitting: Oncology

## 2019-12-29 ENCOUNTER — Inpatient Hospital Stay: Payer: Medicare Other

## 2019-12-29 ENCOUNTER — Other Ambulatory Visit: Payer: Self-pay

## 2019-12-29 VITALS — BP 148/94 | HR 76 | Temp 97.8°F | Resp 18 | Wt 180.2 lb

## 2019-12-29 DIAGNOSIS — T451X5A Adverse effect of antineoplastic and immunosuppressive drugs, initial encounter: Secondary | ICD-10-CM

## 2019-12-29 DIAGNOSIS — Z9289 Personal history of other medical treatment: Secondary | ICD-10-CM | POA: Diagnosis not present

## 2019-12-29 DIAGNOSIS — C561 Malignant neoplasm of right ovary: Secondary | ICD-10-CM

## 2019-12-29 DIAGNOSIS — D701 Agranulocytosis secondary to cancer chemotherapy: Secondary | ICD-10-CM | POA: Diagnosis not present

## 2019-12-29 DIAGNOSIS — N838 Other noninflammatory disorders of ovary, fallopian tube and broad ligament: Secondary | ICD-10-CM

## 2019-12-29 DIAGNOSIS — Z01419 Encounter for gynecological examination (general) (routine) without abnormal findings: Secondary | ICD-10-CM

## 2019-12-29 DIAGNOSIS — K521 Toxic gastroenteritis and colitis: Secondary | ICD-10-CM

## 2019-12-29 DIAGNOSIS — Z5111 Encounter for antineoplastic chemotherapy: Secondary | ICD-10-CM

## 2019-12-29 LAB — CBC WITH DIFFERENTIAL/PLATELET
Abs Immature Granulocytes: 0.01 10*3/uL (ref 0.00–0.07)
Basophils Absolute: 0 10*3/uL (ref 0.0–0.1)
Basophils Relative: 0 %
Eosinophils Absolute: 0.1 10*3/uL (ref 0.0–0.5)
Eosinophils Relative: 3 %
HCT: 35.2 % — ABNORMAL LOW (ref 36.0–46.0)
Hemoglobin: 12.2 g/dL (ref 12.0–15.0)
Immature Granulocytes: 0 %
Lymphocytes Relative: 48 %
Lymphs Abs: 1.3 10*3/uL (ref 0.7–4.0)
MCH: 33.2 pg (ref 26.0–34.0)
MCHC: 34.7 g/dL (ref 30.0–36.0)
MCV: 95.7 fL (ref 80.0–100.0)
Monocytes Absolute: 0.6 10*3/uL (ref 0.1–1.0)
Monocytes Relative: 21 %
Neutro Abs: 0.8 10*3/uL — ABNORMAL LOW (ref 1.7–7.7)
Neutrophils Relative %: 28 %
Platelets: 237 10*3/uL (ref 150–400)
RBC: 3.68 MIL/uL — ABNORMAL LOW (ref 3.87–5.11)
RDW: 14.8 % (ref 11.5–15.5)
WBC: 2.8 10*3/uL — ABNORMAL LOW (ref 4.0–10.5)
nRBC: 0 % (ref 0.0–0.2)

## 2019-12-29 LAB — COMPREHENSIVE METABOLIC PANEL
ALT: 19 U/L (ref 0–44)
AST: 14 U/L — ABNORMAL LOW (ref 15–41)
Albumin: 3.7 g/dL (ref 3.5–5.0)
Alkaline Phosphatase: 96 U/L (ref 38–126)
Anion gap: 6 (ref 5–15)
BUN: 9 mg/dL (ref 8–23)
CO2: 25 mmol/L (ref 22–32)
Calcium: 10.3 mg/dL (ref 8.9–10.3)
Chloride: 103 mmol/L (ref 98–111)
Creatinine, Ser: 0.58 mg/dL (ref 0.44–1.00)
GFR calc Af Amer: >60 mL/min (ref >60)
GFR calc non Af Amer: >60 mL/min (ref >60)
Glucose, Bld: 95 mg/dL (ref 70–99)
Potassium: 4.5 mmol/L (ref 3.5–5.1)
Sodium: 134 mmol/L — ABNORMAL LOW (ref 135–145)
Total Bilirubin: 0.4 mg/dL (ref 0.3–1.2)
Total Protein: 6.1 g/dL — ABNORMAL LOW (ref 6.5–8.1)

## 2019-12-29 MED ORDER — SODIUM CHLORIDE 0.9% FLUSH
10.0000 mL | INTRAVENOUS | Status: DC | PRN
  Administered 2019-12-29: 10 mL via INTRAVENOUS
  Filled 2019-12-29: qty 10

## 2019-12-29 MED ORDER — SODIUM CHLORIDE 0.9 % IV SOLN
Freq: Once | INTRAVENOUS | Status: AC
  Filled 2019-12-29: qty 250

## 2019-12-29 MED ORDER — HEPARIN SOD (PORK) LOCK FLUSH 100 UNIT/ML IV SOLN
500.0000 [IU] | Freq: Once | INTRAVENOUS | Status: AC
  Administered 2019-12-29: 500 [IU] via INTRAVENOUS
  Filled 2019-12-29: qty 5

## 2019-12-29 MED ORDER — SODIUM CHLORIDE 0.9 % IV SOLN
INTRAVENOUS | Status: DC
  Filled 2019-12-29: qty 250

## 2019-12-30 ENCOUNTER — Other Ambulatory Visit: Payer: Self-pay | Admitting: Oncology

## 2019-12-30 DIAGNOSIS — K521 Toxic gastroenteritis and colitis: Secondary | ICD-10-CM

## 2019-12-30 LAB — CA 125: Cancer Antigen (CA) 125: 7.7 U/mL (ref 0.0–38.1)

## 2019-12-30 NOTE — Telephone Encounter (Signed)
   Ref Range & Units 1 d ago 3 wk ago 1 mo ago  Potassium 3.5 - 5.1 mmol/L 4.5  4.4  4.3

## 2019-12-31 ENCOUNTER — Encounter: Payer: Self-pay | Admitting: Oncology

## 2019-12-31 NOTE — Progress Notes (Signed)
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 This was followed by an MR pelvis with and without contrast in December 2020 which showed a large cystic mass arising from the right ovary measuring 1.8 cm. This was followed by TAH/BSO with peritoneal biopsies with pelvic/aortic lymph node dissection pelvic washings and omentectomy with Dr. Fransisca Connors and Dr. Georgianne Fick on 08/19/2019. 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 Angiolymphatic invasion is present. Bilateral fallopian tubes were negative for atypia and malignancy. Peritoneal stone excision negative for malignancy. Uterus with cervix negative for atypia and malignancy. Bladder, right gutter negative for malignancy. 5 lymph nodes were sampled and were negative for malignancy. Pelvic washings were also negative for malignancy. Pathologic staging FIGO1 C2  Patient was seen by Dr. Fransisca Connors postoperatively and recommendation was to proceed with adjuvant chemotherapy carbotaxol for 6 cycles.   Interval history-patient reports mild tingling numbness in her hands and feet which has not worsened significantly.  She is able to carry out her ADLs without any  limitations.  She did have her second bout of UTI and required another course of antibiotics.  She also had diarrhea which lasted for few days but has now resolved.  She feels well today.  No fever  ECOG PS- 1 Pain scale- 0   Review of systems- Review of Systems  Constitutional: Negative for chills, fever, malaise/fatigue and weight loss.  HENT: Negative for congestion, ear discharge and nosebleeds.   Eyes: Negative for blurred vision.  Respiratory: Negative for cough, hemoptysis, sputum production, shortness of breath and wheezing.   Cardiovascular: Negative for chest pain, palpitations, orthopnea and claudication.  Gastrointestinal: Negative for abdominal pain, blood in stool, constipation, diarrhea, heartburn, melena, nausea and vomiting.  Genitourinary: Negative for dysuria, flank pain, frequency, hematuria and urgency.  Musculoskeletal: Negative for back pain, joint pain and myalgias.  Skin: Negative for rash.  Neurological: Negative for dizziness, tingling, focal weakness, seizures, weakness and headaches.  Endo/Heme/Allergies: Does not bruise/bleed easily.  Psychiatric/Behavioral: Negative for depression and suicidal ideas. The patient does not have insomnia.       Allergies  Allergen Reactions  . Citalopram Nausea And Vomiting  . Ivp Dye [Iodinated Diagnostic Agents] Hives and Swelling    hives  . Meloxicam     unknown  . Adhesive [Tape] Rash  . Amlodipine Rash  . Ampicillin Rash    Did it involve swelling of the face/tongue/throat, SOB, or low BP? No Did it involve sudden or severe rash/hives, skin peeling, or any reaction on the inside of your mouth or nose? Yes Did you need to seek medical attention at a hospital or doctor's office? Yes When did it last happen? 10 + years If all above answers are "NO", may proceed with cephalosporin use.  . Cefoxitin Rash  . Estradiol Rash  . Estropipate Rash  .  Olmesartan Medoxomil-Hctz Rash  . Sertraline Rash       .  Sulfa Antibiotics Rash     Past Medical History:  Diagnosis Date  . Anxiety   . Arthritis   . Family history of breast cancer   . Family history of leukemia   . GERD (gastroesophageal reflux disease)   . History of ischemic colitis 09/02/2017  . Hx of colonic polyp   . Hypertension    H/O-PT TAKEN OFF BY PCP AS OF 2019  . IBS (irritable bowel syndrome)   . Ovarian cancer ([place])   . Ovarian cyst   . Vaginal atrophy      Past Surgical History:  Procedure Laterality Date  . BREAST BIOPSY Left    needle bx/clip-neg  . CERVICAL BIOPSY  W/ LOOP ELECTRODE EXCISION    . CHOLECYSTECTOMY    . Colonoscopoy  2007, 2008, 2011  . COLONOSCOPY WITH PROPOFOL N/A 05/05/2018   Procedure: COLONOSCOPY WITH PROPOFOL;  Surgeon: Lin Landsman, MD;  Location: [facility];  Service: Gastroenterology;  Laterality: N/A;  . HYSTERECTOMY ABDOMINAL WITH SALPINGO-OOPHORECTOMY N/A 08/19/2019   Procedure: HYSTERECTOMY ABDOMINAL WITH SALPINGO-OOPHORECTOMY WITH PELVIC WASHINGS PERITONEAL BIOPSIES AND PARA AORTIC LYMPH NODE DISSECTION;  Surgeon: [name], MD;  Location: [facility] ORS;  Service: Gynecology;  Laterality: N/A;  . LEEP    . OMENTECTOMY N/A 08/19/2019   Procedure: OMENTECTOMY;  Surgeon: [name], MD;  Location: [facility] ORS;  Service: Gynecology;  Laterality: N/A;  . PORTA CATH INSERTION N/A 09/11/2019   Procedure: PORTA CATH INSERTION;  Surgeon: Algernon Huxley, MD;  Location: [place] CV LAB;  Service: Cardiovascular;  Laterality: N/A;  . TUBAL LIGATION  1999    Social History   Socioeconomic History  . Marital status: Single    Spouse name: Not on file  . Number of children: 0  . Years of education: Not on file  . Highest education level: Associate degree: academic program  Occupational History  . Occupation: Retired  Tobacco Use  . Smoking status: Never Smoker  . Smokeless tobacco: Never Used  . Tobacco comment: smoking cessation materials not required  Vaping Use  .  Vaping Use: Never used  Substance and Sexual Activity  . Alcohol use: Yes    Comment: rarely  . Drug use: No  . Sexual activity: Never    Birth control/protection: Post-menopausal  Other Topics Concern  . Not on file  Social History Narrative  . Not on file   Social Determinants of Health   Financial Resource Strain:   . Difficulty of Paying Living Expenses:   Food Insecurity:   . Worried About Charity fundraiser in the Last Year:   . Arboriculturist in the Last Year:   Transportation Needs:   . Film/video editor (Medical):   Marland Kitchen Lack of Transportation (Non-Medical):   Physical Activity:   . Days of Exercise per Week:   . Minutes of Exercise per Session:   Stress:   . Feeling of Stress :   Social Connections:   . Frequency of Communication with Friends and Family:   . Frequency of Social Gatherings with Friends and Family:   . Attends Religious Services:   . Active Member of Clubs or Organizations:   . Attends Archivist Meetings:   Marland Kitchen Marital Status:   Intimate Partner Violence:   . Fear of Current or Ex-Partner:   . Emotionally Abused:   Marland Kitchen Physically Abused:   . Sexually Abused:  Family History  Problem Relation Age of Onset  . Heart failure Mother   . Atrial fibrillation Mother   . Hypertension Mother   . Diabetes Father   . Heart attack Father   . Breast cancer Maternal Aunt 70  . Breast cancer Maternal Grandmother 80  . Breast cancer Cousin        2 mat cousins  . Hypertension Sister   . Hypertension Sister   . Arthritis Sister   . Leukemia Paternal Aunt   . Leukemia Paternal Uncle   . Brain cancer Paternal Aunt   . Other Brother 13       Drowning accident  . Liver cancer Maternal Uncle   . Alcohol abuse Maternal Uncle   . Prostate cancer Neg Hx   . Kidney cancer Neg Hx      Current Outpatient Medications:  .  acetaminophen (TYLENOL) 500 MG tablet, Take 1,000 mg by mouth every 6 (six) hours as needed (for pain.)., Disp: , Rfl:    .  amitriptyline (ELAVIL) 25 MG tablet, Take 2 tablets (50 mg total) by mouth at bedtime., Disp: 90 tablet, Rfl: 0 .  bacitracin 500 UNIT/GM ointment, Apply 1 application topically 2 (two) times daily. Apply to affected area twice a day as directed., Disp: 15 g, Rfl: 1 .  benzocaine-resorcinol (VAGISIL) 5-2 % vaginal cream, Place 1 application vaginally daily as needed for itching or irritation., Disp: , Rfl:  .  cetirizine (ZYRTEC) 10 MG tablet, Take 10 mg by mouth daily as needed (allergies.). , Disp: , Rfl:  .  ciprofloxacin (CIPRO) 500 MG tablet, Take 1 tablet (500 mg total) by mouth 2 (two) times daily., Disp: 14 tablet, Rfl: 0 .  dexamethasone (DECADRON) 4 MG tablet, Take 2 tablets (8 mg total) by mouth daily. Start the day after carboplatin chemotherapy for 3 days., Disp: 30 tablet, Rfl: 1 .  diphenhydrAMINE (BENADRYL) 25 MG tablet, Take 25 mg by mouth daily as needed for itching or allergies., Disp: , Rfl:  .  fluconazole (DIFLUCAN) 150 MG tablet, Take 1 tablet (150 mg total) by mouth daily., Disp: 1 tablet, Rfl: 0 .  fluticasone (FLONASE) 50 MCG/ACT nasal spray, Place 2 sprays into both nostrils daily., Disp: 16 g, Rfl: 0 .  guaiFENesin (MUCINEX) 600 MG 12 hr tablet, Take 600 mg by mouth at bedtime as needed (congestion)., Disp: , Rfl:  .  hydrocortisone cream 1 %, Apply 1 application topically daily as needed for itching., Disp: , Rfl:  .  lidocaine-prilocaine (EMLA) cream, Apply to affected area once (Patient taking differently: Apply 1 application topically daily as needed (prior to port being accessed.). ), Disp: 30 g, Rfl: 3 .  lisinopril (ZESTRIL) 5 MG tablet, TAKE 1 TABLET BY MOUTH EVERY DAY, Disp: 30 tablet, Rfl: 0 .  LORazepam (ATIVAN) 0.5 MG tablet, Take 1 tablet (0.5 mg total) by mouth every 6 (six) hours as needed (Nausea/vomiting or anxiety)., Disp: 90 tablet, Rfl: 0 .  magnesium chloride (SLOW-MAG) 64 MG TBEC SR tablet, Take 1 tablet (64 mg total) by mouth daily., Disp: 60  tablet, Rfl: 0 .  nitrofurantoin, macrocrystal-monohydrate, (MACROBID) 100 MG capsule, Take 1 capsule (100 mg total) by mouth 2 (two) times daily., Disp: 14 capsule, Rfl: 0 .  omeprazole (PRILOSEC) 20 MG capsule, Take 20 mg by mouth daily as needed (acid reflux)., Disp: , Rfl:  .  ondansetron (ZOFRAN) 8 MG tablet, Take 1 tablet (8 mg total) by mouth 2 (two) times daily as needed for  refractory nausea / vomiting. Start on day 3 after carboplatin chemo., Disp: 30 tablet, Rfl: 1 .  oxyCODONE-acetaminophen (PERCOCET/ROXICET) 5-325 MG tablet, Take 1-2 tablets by mouth every 4 (four) hours as needed for moderate pain., Disp: 40 tablet, Rfl: 0 .  polyethylene glycol (MIRALAX / GLYCOLAX) 17 g packet, Take 17 g by mouth 2 (two) times daily., Disp: , Rfl:  .  prochlorperazine (COMPAZINE) 10 MG tablet, Take 1 tablet (10 mg total) by mouth every 6 (six) hours as needed (Nausea or vomiting)., Disp: 30 tablet, Rfl: 1 .  psyllium (METAMUCIL) 0.52 g capsule, Take 0.52 g by mouth in the morning and at bedtime., Disp: , Rfl:  .  rOPINIRole (REQUIP) 1 MG tablet, Take 1 tablet (1 mg total) by mouth at bedtime., Disp: 30 tablet, Rfl: 2 .  senna (SENOKOT) 8.6 MG tablet, Take 1 tablet by mouth daily as needed. , Disp: , Rfl:  .  triamcinolone cream (KENALOG) 0.1 %, Apply 1 application topically 2 (two) times daily. Apply to affected area twice a day as directed., Disp: 80 g, Rfl: 0 .  KLOR-CON M10 10 MEQ tablet, TAKE 1 TABLET (10 MEQ TOTAL) BY MOUTH 2 (TWO) TIMES DAILY., Disp: 60 tablet, Rfl: 0  Physical exam:  Vitals:   12/29/19 0835  BP: (!) 148/94  Pulse: 76  Resp: 18  Temp: 97.8 F (36.6 C)  TempSrc: Tympanic  SpO2: 100%  Weight: 180 lb 3.2 oz (81.7 kg)   Physical Exam Constitutional:      General: She is not in acute distress. Cardiovascular:     Rate and Rhythm: Normal rate and regular rhythm.     Heart sounds: Normal heart sounds.  Pulmonary:     Effort: Pulmonary effort is normal.     Breath  sounds: Normal breath sounds.  Abdominal:     General: Bowel sounds are normal.     Palpations: Abdomen is soft.  Skin:    General: Skin is warm and dry.  Neurological:     Mental Status: She is alert and oriented to person, place, and time.      CMP Latest Ref Rng & Units 12/29/2019  Glucose 70 - 99 mg/dL 95  BUN 8 - 23 mg/dL 9  Creatinine 0.44 - 1.00 mg/dL 0.58  Sodium 135 - 145 mmol/L 134(L)  Potassium 3.5 - 5.1 mmol/L 4.5  Chloride 98 - 111 mmol/L 103  CO2 22 - 32 mmol/L 25  Calcium 8.9 - 10.3 mg/dL 10.3  Total Protein 6.5 - 8.1 g/dL 6.1(L)  Total Bilirubin 0.3 - 1.2 mg/dL 0.4  Alkaline Phos 38 - 126 U/L 96  AST 15 - 41 U/L 14(L)  ALT 0 - 44 U/L 19   CBC Latest Ref Rng & Units 12/29/2019  WBC 4.0 - 10.5 K/uL 2.8(L)  Hemoglobin 12.0 - 15.0 g/dL 12.2  Hematocrit 36 - 46 % 35.2(L)  Platelets 150 - 400 K/uL 237      Assessment and plan- Patient is a 74 y.o. 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   She is here for on treatment assessment prior to cycle 6 of adjuvant carbotaxol chemotherapy  Patient's white count is 2.8 today with an ANC of 0.8.  Since her Absolute neutrophil count is less than 1 I will hold off on giving her chemotherapy today.  She will get a repeat CBC with differential next week and will directly proceed with cycle 6 of chemotherapy if ANC is greater than  1.  That will be her last chemo.  Patient feels better with weekly IV fluids and will receive it today as well as next week and the week after.  I will see her back in 2 months time with CBC with differential CMP and CA-125.  Surveillance imaging will only be based on concerning signs and symptoms.  Patient is overdue for her mammogram and I will order it for her and subsequently she will get it done with her primary care doctor or GYN.  restless leg syndrome: We will refill her requip.  Patient states that she has been getting benefit from  it.  Anxiety: I will refill her Ativan when need be for the next 2 months but after that I have recommended that she should try to wean herself off the medication  Recurrent UTI: S/p second course of ciprofloxacin.  Urine culture showed E. coli that was pansensitive.  If she has another episode I will refer her back to urology.  Chemo-induced peripheral neuropathy: Mild grade 1.  She is not on any modifications at this time.  Continue to monitor     Visit Diagnosis 1. Chemotherapy induced diarrhea   2. 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HCC)   3. History of mammogram   4. Encounter for antineoplastic chemotherapy   5. Chemotherapy induced neutropenia (HCC)      Dr. Randa Evens, MD, MPH [facility] [phone] 12/31/2019 8:20 AM

## 2020-01-05 ENCOUNTER — Other Ambulatory Visit: Payer: Self-pay | Admitting: Oncology

## 2020-01-05 ENCOUNTER — Inpatient Hospital Stay: Payer: Medicare Other

## 2020-01-05 ENCOUNTER — Other Ambulatory Visit: Payer: Self-pay

## 2020-01-05 VITALS — BP 138/84 | HR 72 | Temp 97.5°F | Resp 18 | Wt 177.0 lb

## 2020-01-05 DIAGNOSIS — C561 Malignant neoplasm of right ovary: Secondary | ICD-10-CM

## 2020-01-05 DIAGNOSIS — Z95828 Presence of other vascular implants and grafts: Secondary | ICD-10-CM

## 2020-01-05 DIAGNOSIS — Z5111 Encounter for antineoplastic chemotherapy: Secondary | ICD-10-CM | POA: Diagnosis not present

## 2020-01-05 DIAGNOSIS — R300 Dysuria: Secondary | ICD-10-CM

## 2020-01-05 LAB — COMPREHENSIVE METABOLIC PANEL
ALT: 18 U/L (ref 0–44)
AST: 14 U/L — ABNORMAL LOW (ref 15–41)
Albumin: 4 g/dL (ref 3.5–5.0)
Alkaline Phosphatase: 102 U/L (ref 38–126)
Anion gap: 8 (ref 5–15)
BUN: 13 mg/dL (ref 8–23)
CO2: 23 mmol/L (ref 22–32)
Calcium: 10.1 mg/dL (ref 8.9–10.3)
Chloride: 100 mmol/L (ref 98–111)
Creatinine, Ser: 0.59 mg/dL (ref 0.44–1.00)
GFR calc Af Amer: >60 mL/min (ref >60)
GFR calc non Af Amer: >60 mL/min (ref >60)
Glucose, Bld: 118 mg/dL — ABNORMAL HIGH (ref 70–99)
Potassium: 4.3 mmol/L (ref 3.5–5.1)
Sodium: 131 mmol/L — ABNORMAL LOW (ref 135–145)
Total Bilirubin: 0.6 mg/dL (ref 0.3–1.2)
Total Protein: 6.5 g/dL (ref 6.5–8.1)

## 2020-01-05 LAB — URINALYSIS, COMPLETE (UACMP) WITH MICROSCOPIC
Bilirubin Urine: NEGATIVE
Glucose, UA: NEGATIVE mg/dL
Hgb urine dipstick: NEGATIVE
Ketones, ur: NEGATIVE mg/dL
Leukocytes,Ua: NEGATIVE
Nitrite: NEGATIVE
Protein, ur: NEGATIVE mg/dL
Specific Gravity, Urine: 1.013 (ref 1.005–1.030)
pH: 5 (ref 5.0–8.0)

## 2020-01-05 LAB — CBC WITH DIFFERENTIAL/PLATELET
Abs Immature Granulocytes: 0.01 10*3/uL (ref 0.00–0.07)
Basophils Absolute: 0 10*3/uL (ref 0.0–0.1)
Basophils Relative: 1 %
Eosinophils Absolute: 0.1 10*3/uL (ref 0.0–0.5)
Eosinophils Relative: 3 %
HCT: 37 % (ref 36.0–46.0)
Hemoglobin: 12.9 g/dL (ref 12.0–15.0)
Immature Granulocytes: 0 %
Lymphocytes Relative: 31 %
Lymphs Abs: 1.1 10*3/uL (ref 0.7–4.0)
MCH: 33.7 pg (ref 26.0–34.0)
MCHC: 34.9 g/dL (ref 30.0–36.0)
MCV: 96.6 fL (ref 80.0–100.0)
Monocytes Absolute: 0.5 10*3/uL (ref 0.1–1.0)
Monocytes Relative: 12 %
Neutro Abs: 2 10*3/uL (ref 1.7–7.7)
Neutrophils Relative %: 53 %
Platelets: 227 10*3/uL (ref 150–400)
RBC: 3.83 MIL/uL — ABNORMAL LOW (ref 3.87–5.11)
RDW: 13.8 % (ref 11.5–15.5)
WBC: 3.7 10*3/uL — ABNORMAL LOW (ref 4.0–10.5)
nRBC: 0 % (ref 0.0–0.2)

## 2020-01-05 MED ORDER — SODIUM CHLORIDE 0.9 % IV SOLN
150.0000 mg | Freq: Once | INTRAVENOUS | Status: AC
  Administered 2020-01-05: 150 mg via INTRAVENOUS
  Filled 2020-01-05: qty 150

## 2020-01-05 MED ORDER — FAMOTIDINE IN NACL 20-0.9 MG/50ML-% IV SOLN
20.0000 mg | Freq: Once | INTRAVENOUS | Status: AC
  Administered 2020-01-05: 20 mg via INTRAVENOUS
  Filled 2020-01-05: qty 50

## 2020-01-05 MED ORDER — SODIUM CHLORIDE 0.9 % IV SOLN
175.0000 mg/m2 | Freq: Once | INTRAVENOUS | Status: AC
  Administered 2020-01-05: 336 mg via INTRAVENOUS
  Filled 2020-01-05: qty 56

## 2020-01-05 MED ORDER — SODIUM CHLORIDE 0.9% FLUSH
10.0000 mL | INTRAVENOUS | Status: DC | PRN
  Administered 2020-01-05: 10 mL via INTRAVENOUS
  Filled 2020-01-05: qty 10

## 2020-01-05 MED ORDER — SODIUM CHLORIDE 0.9 % IV SOLN
354.8000 mg | Freq: Once | INTRAVENOUS | Status: AC
  Administered 2020-01-05: 350 mg via INTRAVENOUS
  Filled 2020-01-05: qty 35

## 2020-01-05 MED ORDER — PALONOSETRON HCL INJECTION 0.25 MG/5ML
0.2500 mg | Freq: Once | INTRAVENOUS | Status: AC
  Administered 2020-01-05: 0.25 mg via INTRAVENOUS
  Filled 2020-01-05: qty 5

## 2020-01-05 MED ORDER — SODIUM CHLORIDE 0.9 % IV SOLN
10.0000 mg | Freq: Once | INTRAVENOUS | Status: AC
  Administered 2020-01-05: 10 mg via INTRAVENOUS
  Filled 2020-01-05: qty 10

## 2020-01-05 MED ORDER — SODIUM CHLORIDE 0.9 % IV SOLN
Freq: Once | INTRAVENOUS | Status: AC
  Filled 2020-01-05: qty 250

## 2020-01-05 MED ORDER — HEPARIN SOD (PORK) LOCK FLUSH 100 UNIT/ML IV SOLN
500.0000 [IU] | Freq: Once | INTRAVENOUS | Status: AC | PRN
  Administered 2020-01-05: 500 [IU]
  Filled 2020-01-05: qty 5

## 2020-01-05 MED ORDER — DIPHENHYDRAMINE HCL 50 MG/ML IJ SOLN
50.0000 mg | Freq: Once | INTRAMUSCULAR | Status: AC
  Administered 2020-01-05: 50 mg via INTRAVENOUS
  Filled 2020-01-05: qty 1

## 2020-01-05 MED ORDER — PEGFILGRASTIM 6 MG/0.6ML ~~LOC~~ PSKT
6.0000 mg | PREFILLED_SYRINGE | Freq: Once | SUBCUTANEOUS | Status: AC
  Administered 2020-01-05: 6 mg via SUBCUTANEOUS
  Filled 2020-01-05: qty 0.6

## 2020-01-06 LAB — URINE CULTURE: Culture: <10000 — AB

## 2020-01-07 ENCOUNTER — Inpatient Hospital Stay: Payer: Medicare Other

## 2020-01-07 ENCOUNTER — Other Ambulatory Visit: Payer: Self-pay

## 2020-01-07 DIAGNOSIS — E860 Dehydration: Secondary | ICD-10-CM

## 2020-01-07 DIAGNOSIS — Z95828 Presence of other vascular implants and grafts: Secondary | ICD-10-CM

## 2020-01-07 DIAGNOSIS — Z5111 Encounter for antineoplastic chemotherapy: Secondary | ICD-10-CM | POA: Diagnosis not present

## 2020-01-07 MED ORDER — SODIUM CHLORIDE 0.9 % IV SOLN
Freq: Once | INTRAVENOUS | Status: AC
  Filled 2020-01-07: qty 250

## 2020-01-07 MED ORDER — SODIUM CHLORIDE 0.9% FLUSH
10.0000 mL | INTRAVENOUS | Status: DC | PRN
  Administered 2020-01-07: 10 mL via INTRAVENOUS
  Filled 2020-01-07: qty 10

## 2020-01-07 MED ORDER — HEPARIN SOD (PORK) LOCK FLUSH 100 UNIT/ML IV SOLN
500.0000 [IU] | Freq: Once | INTRAVENOUS | Status: AC
  Administered 2020-01-07: 500 [IU] via INTRAVENOUS
  Filled 2020-01-07: qty 5

## 2020-01-13 ENCOUNTER — Other Ambulatory Visit: Payer: Self-pay

## 2020-01-13 ENCOUNTER — Other Ambulatory Visit: Payer: Self-pay | Admitting: *Deleted

## 2020-01-13 ENCOUNTER — Inpatient Hospital Stay: Payer: Medicare Other

## 2020-01-13 ENCOUNTER — Other Ambulatory Visit: Payer: Self-pay | Admitting: Oncology

## 2020-01-13 VITALS — BP 120/63 | HR 65 | Temp 97.4°F | Resp 18

## 2020-01-13 DIAGNOSIS — K521 Toxic gastroenteritis and colitis: Secondary | ICD-10-CM

## 2020-01-13 DIAGNOSIS — C561 Malignant neoplasm of right ovary: Secondary | ICD-10-CM

## 2020-01-13 DIAGNOSIS — T451X5A Adverse effect of antineoplastic and immunosuppressive drugs, initial encounter: Secondary | ICD-10-CM

## 2020-01-13 DIAGNOSIS — Z5111 Encounter for antineoplastic chemotherapy: Secondary | ICD-10-CM | POA: Diagnosis not present

## 2020-01-13 MED ORDER — SODIUM CHLORIDE 0.9 % IV SOLN
Freq: Once | INTRAVENOUS | Status: AC
  Filled 2020-01-13: qty 250

## 2020-01-13 MED ORDER — HEPARIN SOD (PORK) LOCK FLUSH 100 UNIT/ML IV SOLN
INTRAVENOUS | Status: AC
  Filled 2020-01-13: qty 5

## 2020-01-13 MED ORDER — HEPARIN SOD (PORK) LOCK FLUSH 100 UNIT/ML IV SOLN
500.0000 [IU] | Freq: Once | INTRAVENOUS | Status: AC
  Administered 2020-01-13: 500 [IU] via INTRAVENOUS
  Filled 2020-01-13: qty 5

## 2020-01-13 NOTE — Telephone Encounter (Signed)
   Ref Range & Units 8 d ago  Potassium 3.5 - 5.1 mmol/L 4.3

## 2020-01-19 ENCOUNTER — Other Ambulatory Visit: Payer: Self-pay | Admitting: Nurse Practitioner

## 2020-01-19 DIAGNOSIS — T451X5A Adverse effect of antineoplastic and immunosuppressive drugs, initial encounter: Secondary | ICD-10-CM

## 2020-01-19 DIAGNOSIS — K521 Toxic gastroenteritis and colitis: Secondary | ICD-10-CM

## 2020-01-19 NOTE — Progress Notes (Signed)
[place]  Telephone:([phone] [phone]  Patient Care Team: Juline Patch, MD as PCP - General (Family Medicine) Lin Landsman, MD as Consulting Physician (Gastroenterology) Clent Jacks, RN as Oncology Nurse Navigator [name], MD as Consulting Physician (Obstetrics and Gynecology)   Name of the patient: Carrie Roberson  [id number]  1946-01-17   Date of visit: 01/20/2020  Gynecologic Oncology Interval Visit   Referring Provider:  Dr. 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 G0P0 female, initially seen in consultation from Dr. Georgianne Fick, for right ovarian cyst returns to clinic s/p TAH-BSO for discussion of pathology results and treatment planning.   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  She has had numerous symptom management clinic visits for hyponatremia, constipation, and diarrhea. Got two days of IV hydration after most cycles.  She presents today for continued surveillance of ovarian cancer including a pelvic exam.    Gynecologic Oncology History:  [place] concerning a midline pelvic mass.  Initial presentation was prompted by frequent urination and bladder pessure. PVR was checked and noted to be normal but bladder scan showed residual fluid collection.  TVUS was ordered and revealed a 10.2 x 9.2 x 6.3 cm midline cystic pelvic mass. Symptoms initially started in August of 2020. She had previously been followed asymptomatic 1.42 x 1.58cm simple left ovarian cyst on 11/18/2017.    Appearance on 06/26/2019 ultrasound was notable simple cyst, normal doppler flow and no free fluid. The patient endorses associated symptoms of pelvic pressure.  The patient denies associated symptoms of  early satiety, weight gain, weight loss, night sweats,  vaginal bleeding and nausea.  There is not a notable family history of ovarian cancer, uterine cancer, breast cancer, or colon cancer.  06/07/2018 Pelvic US Findings:  The uterus is anteverted and measures 6.8x3x2.7cm. Echo texture is homogenous without evidence of focal masses. The Endometrium measures 4.19 mm.  Right Ovary measures 4.7 cm3. It is normal in appearance. Left Ovary measures 5.2 cm3. It has a hypoechoic cyst = 1.4x1.54m Survey of the adnexa demonstrates no adnexal masses. There is no free fluid in the cul de sac. IMPRESSION:  1. Left Ovary measures 5.2 cm3. It has a hypoechoic cyst = 1.4x1.428m(possibility of hemorrhagic cyst vs others) 2. Cervical canal appears irregular d/t LEEP procedure  06/26/2019 Abdominal USK[place]FINDINGS: No ascites is demonstrated. There is a cystic midline pelvic mass which lies superior to the bladder, measuring 10.2 x 9.2 x 6.3 cm. IMPRESSION: 1. No ascites. 2. Large cystic mass in the pelvis, not demonstrated on previous ultrasounds. This is suspicious for ovarian cystic neoplasm. Further evaluation recommended with dedicated pelvic ultrasound and/or abdominopelvic CT.  07/01/2019 MRI Reproductive: Uterus: Measures 4.3 x 2.0 by 5.9 cm. Scattered nabothian cysts identified. No mass. Right ovary measures the 10.5 x 6.8 by 9.0 cm (volume = 340 cm^3). There is a large cystic mass arising from the right ovary which has a maximum dimension of 10.8 cm. An eccentric, enhancing mural nodule is identified measuring 2.6 x 1.2 cm, image 50/10. No internal septation. Left ovary measures 2.9 x 2.1 by 1.8 cm. (Volume = 5.7 cm^3). This contains several small cyst which measure up to 1.4 cm.  06/29/2019 labs Cancer Antigen (CA) 125 0.0 - 38.1 U/mL 10.3  Comment: Roche [employer] Electrochemiluminescence Immunoassay (ECLIA)  Values obtained with different assay methods or kits cannot be  used interchangeably. Results cannot be interpreted as absolute   evidence of the presence or absence of malignant disease.   HE4 0.0 - 96.9 pmol/L 68.8   Comment: Roche [employer] Electrochemiluminescence Immunoassay (ECLIA)  Values obtained with different assay methods or kits cannot be  used interchangeably. Results cannot be interpreted as absolute  evidence of the presence or absence of malignant disease.       Postmenopausal ROMA See below 1.21    ROMA calculator 12%  Of note prior LEEP 2002 (negative pathology per patient) last pap 07/26/2014 NILM.   On 08/19/19 she under TAH-BSO with with peritoneal biopsies, pelvic/aortic lymph node dissection, pelvic washings, and omentectomy with Dr. Fransisca Connors and Dr. Georgianne Fick at [facility].  Uneventful recovery.    Pathology:  DIAGNOSIS:  A. FALLOPIAN TUBE AND OVARY, RIGHT; RIGHT SALPINGO-OOPHORECTOMY:  - HIGH-GRADE SEROUS CARCINOMA OF THE OVARY.  - ANGIOLYMPHATIC INVASION IS PRESENT.  - SEE CANCER SUMMARY BELOW.  - FALLOPIAN TUBE WITH BENIGN PARATUBAL CYSTS.   B. FALLOPIAN TUBE AND OVARY, LEFT; LEFT SALPINGO-OOPHORECTOMY:  - OVARY WITH SEROUS CYSTADENOMA MEASURING 1.5 CM AND CYSTIC FOLLICLES.  - FALLOPIAN TUBE WITH BENIGN PARATUBAL CYSTS.  - NEGATIVE FOR ATYPIA AND MALIGNANCY.   C. PERITONEAL STONE; EXCISION:  - STONE, 1.7 CM (GROSS ONLY).   D. UTERUS WITH CERVIX; TOTAL HYSTERECTOMY:  - CERVIX WITH ENDOCERVICAL POLYP MEASURING 0.5 CM, NABOTHIAN CYSTS, AND  TUNNEL CLUSTERS.  - INACTIVE ENDOMETRIUM WITH SMALL ENDOMETRIAL POLYP.  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  - NEGATIVE FOR ATYPIA AND MALIGNANCY.   E. RIGHT GUTTER; BIOPSY:  - BENIGN MESOTHELIAL LINED FIBROADIPOSE TISSUE.  - NEGATIVE FOR MALIGNANCY.   F. BLADDER FLAP PERITONEUM; BIOPSY:  - BENIGN FIBROADIPOSE TISSUE.  - NEGATIVE FOR MALIGNANCY.   G. LYMPH NODE, RIGHT COMMON ILIAC; EXCISION:  - THREE LYMPH NODES NEGATIVE FOR MALIGNANCY (0/3).   H. LYMPH NODE, RIGHT PELVIC; EXCISION:  - ONE  LYMPH NODE NEGATIVE FOR MALIGNANCY (0/1).   I. OMENTUM; OMENTECTOMY:  - BENIGN FIBROADIPOSE TISSUE.  - NEGATIVE FOR MALIGNANCY.   J. LYMPH NODE, RIGHT AORTIC; EXCISION:  - ONE LYMPH NODE NEGATIVE FOR MALIGNANCY (0/1).  DIAGNOSIS:  A. PELVIC WASHINGS:  - NEGATIVE FOR MALIGNANCY.  - ERYTHROCYTES AND REACTIVE MESOTHELIAL CELLS  Pathologic Staging: FIGO 1C2  Post op course has been uncomplicated. She received her second covid 19 vaccine. She received lovenox for 28 day course.    09/29/2019 CT C/A/P IMPRESSION: 1. Suspected thyroid nodule along the inferior isthmus margin of the thyroid gland along with a separate anterior mediastinal fairly high density mass with punctate calcifications. 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 Thyroid scintigraphy should be considered to further characterize these lesions; if negative on scintigraphy then further workup to exclude the possibility of lymphoma or thymic tumor would be suggested. 2. Contrast medium in the distal esophagus suggesting reflux or dysmotility. 3. Several tiny pulmonary nodules are present, 3 mm in average diameter or less. No follow-up needed if patient is low-risk (and has no known or suspected primary neoplasm). Non-contrast chest CT can be considered in 12 months if patient is high-risk. This recommendation follows the consensus statement: Guidelines for Management of Incidental Pulmonary Nodules Detected on CT Images: From the Fleischner Society 2017; Radiology 2017; 284:228-243. 4. Aortoiliac atherosclerotic vascular disease. 5. Multilevel  lumbar degenerative disc disease. 6. Prior laparotomy with some faint stranding along the anterior omentum in the immediate vicinity of the laparotomy site, likely from benign scarring.  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  Genetic testing for germline BRCA1/2 mutations negative.   Problem List: Patient Active Problem List   Diagnosis Date Noted   Genetic testing 11/02/2019   Weight loss, unintentional 10/16/2019   Chemotherapy induced diarrhea 10/13/2019   Orthostasis 10/13/2019   Hypokalemia 10/13/2019   Restless leg 10/13/2019   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 10/13/2019   Iron deficiency 10/02/2019   Anxiety associated with cancer diagnosis 10/02/2019   Chemotherapy-induced nausea 10/02/2019   Thyroid nodule 10/02/2019   Hyponatremia 10/02/2019   B12 deficiency 09/18/2019   Family history of breast cancer    Family history of leukemia    Goals of care, counseling/discussion 09/07/2019   Ovarian mass 08/19/2019   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) 08/19/2019   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HCC)    Cyst of ovary 07/15/2019   Anxiety 09/02/2017   DDD (degenerative disc disease), 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   FH: diabetes mellitus 09/02/2017   Obesity, Class I, BMI 30.0-34.9 (see actual BMI) 09/02/2017   Atypical nevus 09/02/2017   IBS (irritable bowel syndrome) 09/02/2017   Atrophic vaginitis 09/02/2017   Chronic idiopathic urticaria 09/02/2017   GERD (gastroesophageal reflux disease) 09/02/2017    Past Medical History:  Past Medical History:  Diagnosis Date   Anxiety    Arthritis    Family history of breast cancer    Family history of leukemia    GERD (gastroesophageal reflux disease)    History of ischemic colitis 09/02/2017   Hx of colonic polyp    Hypertension    H/O-PT TAKEN OFF BY PCP AS OF 2019   IBS (irritable bowel syndrome)    Ovarian cancer ([place])    Ovarian cyst    Vaginal atrophy     Past Surgical History: Past Surgical History:  Procedure Laterality Date   BREAST  BIOPSY Left    needle bx/clip-neg   CERVICAL BIOPSY  W/ LOOP ELECTRODE EXCISION     CHOLECYSTECTOMY     Colonoscopoy  2007, 2008, 2011   COLONOSCOPY WITH PROPOFOL N/A 05/05/2018   Procedure: COLONOSCOPY WITH PROPOFOL;  Surgeon: Lin Landsman, MD;  Location: [facility] ENDOSCOPY;  Service: Gastroenterology;  Laterality: N/A;   HYSTERECTOMY ABDOMINAL WITH SALPINGO-OOPHORECTOMY N/A 08/19/2019   Procedure: HYSTERECTOMY ABDOMINAL WITH SALPINGO-OOPHORECTOMY WITH PELVIC WASHINGS PERITONEAL BIOPSIES AND PARA AORTIC LYMPH NODE DISSECTION;  Surgeon: [name], MD;  Location: [facility] ORS;  Service: Gynecology;  Laterality: N/A;   LEEP     OMENTECTOMY N/A 08/19/2019   Procedure: OMENTECTOMY;  Surgeon: [name], MD;  Location: [facility] ORS;  Service: Gynecology;  Laterality: N/A;   PORTA CATH INSERTION N/A 09/11/2019   Procedure: PORTA CATH INSERTION;  Surgeon: Algernon Huxley, MD;  Location: [place] LAB;  Service: Cardiovascular;  Laterality: N/A;   TUBAL LIGATION  1999    Past Gynecologic History:  Menarche: 9 Last Menstrual Period: menopausal History of Abnormal pap: No Contraception: TAH-SBO (07/2019); tubal ligation (1999)  OB History:  OB History  Gravida Para Term Preterm AB Living  0 0 0 0 0  0  SAB TAB Ectopic Multiple Live Births  0 0 0 0 0    Family History: Family History  Problem Relation Age of Onset   Heart failure Mother    Atrial fibrillation Mother    Hypertension Mother    Diabetes Father    Heart attack Father    Breast cancer Maternal Aunt 17   Breast cancer Maternal Grandmother 63   Breast cancer Cousin        2 mat cousins   Hypertension Sister    Hypertension Sister    Arthritis Sister    Leukemia Paternal Aunt    Leukemia Paternal Uncle    Brain cancer Paternal Aunt    Other Brother 69       Drowning accident   Liver cancer Maternal Uncle    Alcohol abuse Maternal Uncle    Prostate cancer Neg Hx    Kidney  cancer Neg Hx    Social History: Retired  Science writer History   Socioeconomic History   Marital status: Single    Spouse name: Not on file   Number of children: 0   Years of education: Not on file   Highest education level: Associate degree: academic program  Occupational History   Occupation: Retired  Tobacco Use   Smoking status: Never Smoker   Smokeless tobacco: Never Used   Tobacco comment: smoking cessation materials not required  Scientific laboratory technician Use: Never used  Substance and Sexual Activity   Alcohol use: Yes    Comment: rarely   Drug use: No   Sexual activity: Never    Birth control/protection: Post-menopausal  Other Topics Concern   Not on file  Social History Narrative   Not on file   Social Determinants of Health   Financial Resource Strain:    Difficulty of Paying Living Expenses:   Food Insecurity:    Worried About Charity fundraiser in the Last Year:    Arboriculturist in the Last Year:   Transportation Needs:    Film/video editor (Medical):    Lack of Transportation (Non-Medical):   Physical Activity:    Days of Exercise per Week:    Minutes of Exercise per Session:   Stress:    Feeling of Stress :   Social Connections:    Frequency of Communication with Friends and Family:    Frequency of Social Gatherings with Friends and Family:    Attends Religious Services:    Active Member of Clubs or Organizations:    Attends Archivist Meetings:    Marital Status:   Intimate Partner Violence:    Fear of Current or Ex-Partner:    Emotionally Abused:    Physically Abused:    Sexually Abused:    Allergies: Allergies  Allergen Reactions   Citalopram Nausea And Vomiting   Ivp Dye [Iodinated Diagnostic Agents] Hives and Swelling    hives   Meloxicam     unknown   Adhesive [Tape] Rash   Amlodipine Rash   Ampicillin Rash    Did it involve swelling of the face/tongue/throat, SOB, or low BP? No Did  it involve sudden or severe rash/hives, skin peeling, or any reaction on the inside of your mouth or nose? Yes Did you need to seek medical attention at a hospital or doctor's office? Yes When did it last happen? 10 + years If all above answers are NO, may proceed with cephalosporin use.   Cefoxitin Rash   Estradiol Rash  Estropipate Rash   Olmesartan Medoxomil-Hctz Rash   Sertraline Rash        Sulfa Antibiotics Rash   Current Medications: Current Outpatient Medications  Medication Sig Dispense Refill   acetaminophen (TYLENOL) 500 MG tablet Take 1,000 mg by mouth every 6 (six) hours as needed (for pain.).     amitriptyline (ELAVIL) 25 MG tablet Take 2 tablets (50 mg total) by mouth at bedtime. 90 tablet 0   bacitracin 500 UNIT/GM ointment Apply 1 application topically 2 (two) times daily. Apply to affected area twice a day as directed. 15 g 1   benzocaine-resorcinol (VAGISIL) 5-2 % vaginal cream Place 1 application vaginally daily as needed for itching or irritation.     cetirizine (ZYRTEC) 10 MG tablet Take 10 mg by mouth daily as needed (allergies.).      ciprofloxacin (CIPRO) 500 MG tablet Take 1 tablet (500 mg total) by mouth 2 (two) times daily. 14 tablet 0   dexamethasone (DECADRON) 4 MG tablet Take 2 tablets (8 mg total) by mouth daily. Start the day after carboplatin chemotherapy for 3 days. 30 tablet 1   diphenhydrAMINE (BENADRYL) 25 MG tablet Take 25 mg by mouth daily as needed for itching or allergies.     fluconazole (DIFLUCAN) 150 MG tablet Take 1 tablet (150 mg total) by mouth daily. 1 tablet 0   fluticasone (FLONASE) 50 MCG/ACT nasal spray Place 2 sprays into both nostrils daily. 16 g 0   guaiFENesin (MUCINEX) 600 MG 12 hr tablet Take 600 mg by mouth at bedtime as needed (congestion).     hydrocortisone cream 1 % Apply 1 application topically daily as needed for itching.     KLOR-CON M10 10 MEQ tablet TAKE 1 TABLET (10 MEQ TOTAL) BY MOUTH 2  (TWO) TIMES DAILY. 180 tablet 1   lidocaine-prilocaine (EMLA) cream Apply to affected area once (Patient taking differently: Apply 1 application topically daily as needed (prior to port being accessed.). ) 30 g 3   lisinopril (ZESTRIL) 5 MG tablet TAKE 1 TABLET BY MOUTH EVERY DAY 90 tablet 1   LORazepam (ATIVAN) 0.5 MG tablet Take 1 tablet (0.5 mg total) by mouth every 6 (six) hours as needed (Nausea/vomiting or anxiety). 90 tablet 0   magnesium chloride (SLOW-MAG) 64 MG TBEC SR tablet Take 1 tablet (64 mg total) by mouth daily. 60 tablet 0   nitrofurantoin, macrocrystal-monohydrate, (MACROBID) 100 MG capsule Take 1 capsule (100 mg total) by mouth 2 (two) times daily. 14 capsule 0   omeprazole (PRILOSEC) 20 MG capsule Take 20 mg by mouth daily as needed (acid reflux).     ondansetron (ZOFRAN) 8 MG tablet Take 1 tablet (8 mg total) by mouth 2 (two) times daily as needed for refractory nausea / vomiting. Start on day 3 after carboplatin chemo. 30 tablet 1   oxyCODONE-acetaminophen (PERCOCET/ROXICET) 5-325 MG tablet Take 1-2 tablets by mouth every 4 (four) hours as needed for moderate pain. 40 tablet 0   polyethylene glycol (MIRALAX / GLYCOLAX) 17 g packet Take 17 g by mouth 2 (two) times daily.     prochlorperazine (COMPAZINE) 10 MG tablet Take 1 tablet (10 mg total) by mouth every 6 (six) hours as needed (Nausea or vomiting). 30 tablet 1   psyllium (METAMUCIL) 0.52 g capsule Take 0.52 g by mouth in the morning and at bedtime.     rOPINIRole (REQUIP) 1 MG tablet Take 1 tablet (1 mg total) by mouth at bedtime. 30 tablet 2   senna (SENOKOT) 8.6  MG tablet Take 1 tablet by mouth daily as needed.      triamcinolone cream (KENALOG) 0.1 % Apply 1 application topically 2 (two) times daily. Apply to affected area twice a day as directed. 80 g 0   No current facility-administered medications for this visit.   Facility-Administered Medications Ordered in Other Visits  Medication Dose Route  Frequency Provider Last Rate Last Admin   sodium chloride flush (NS) 0.9 % injection 10 mL  10 mL Intravenous PRN Sindy Guadeloupe, MD   10 mL at 01/05/20 0750     Review of Systems  Per interval history Constitutional: Negative for chills and fever.  Eyes: Negative for blurred vision and double vision.  Respiratory: Negative for cough.   Cardiovascular: Negative for chest pain.  Gastrointestinal: Negative for diarrhea and vomiting.  Genitourinary: nopelvic discomfort, vulvar irritation/itching  Skin: Negative for rash.   Objective:  Physical Examination:  Vitals:   01/20/20 1130  BP: 135/66  Pulse: 79  Temp: 98.3 F (36.8 C)  TempSrc: Tympanic  Weight: 177 lb 8 oz (80.5 kg)   ECOG Performance Status: 1 - Symptomatic but completely ambulatory  EXAM  GENERAL: Patient is a well appearing female in no acute distress. Complete alopecia. HEENT:  Sclera clear. Anicteric NODES:  Negative axillary, supraclavicular, inguinal lymph node survery LUNGS:  Clear to auscultation bilaterally.   HEART:  Regular rate and rhythm.  ABDOMEN:  Soft, nontender.  No hernias, incision healing well and intact. No masses or ascites EXTREMITIES:  No peripheral edema. Atraumatic. No cyanosis SKIN:  Clear with no obvious rashes or skin changes.  NEURO:  Nonfocal. Well oriented.  Appropriate affect.  PELVIC: exam chaperoned by nurse;   Vulva: normal appearing vulva with no masses, tenderness or lesions; Vagina: normal vagina healing well. Bimanual: normal  Lab Review CA 125 trend 12/29/2019       7.7 09/15/2019      18.1  06/29/2019      10.3  Radiologic Imaging: No imaging on site today.     Assessment:  Carrie Roberson is a 74 y.o. 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  Finished 6 cycles of carbo/taxol chemotherapy with Neulasta 6/21. 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    Germline genetic testing negative for BRCA mutations.   Medical co-morbidities complicating care: HTN, obesity and prior abdominal surgery (BTL and cholecystectomy). There is no height or weight on file to calculate BMI.   Plan:   Problem List Items Addressed This Visit      Endocrine   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[place]) - Primary     Do not think that repeat imaging needed presently, as she had stage IA disease.  PARP inhibitor maintenance therapy not indicated for stage I disease (even if she had a BRCA mutation).  Does not need somatic tumor testing, as PARP inhibitor maintenance not approved for stage I disease.   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   Plan for q 3 month surveillance alternating between Gyn Onc and Med Onc with exams and CA125 following recent completion of 6 cycles of carboplatin/taxol chemotherapy.  Estimated risk of recurrence 10-20%.    Benedetto Goad, Student FN  I personally had a face to face interaction and evaluated the patient jointly with the NP Student, Mrs. Benedetto Goad.  I have  reviewed her history and available records and have performed the key portions of the physical exam including general, HEENT, abdominal exam, pelvic exam with my findings confirming those documented above by the APP student.  I have discussed the case with the APP student and the patient.  I agree with the above documentation, assessment and plan which was fully formulated by me.  Counseling was completed by me.   Mellody Drown, MD

## 2020-01-20 ENCOUNTER — Other Ambulatory Visit: Payer: Self-pay

## 2020-01-20 ENCOUNTER — Inpatient Hospital Stay (HOSPITAL_BASED_OUTPATIENT_CLINIC_OR_DEPARTMENT_OTHER): Payer: Medicare Other | Admitting: Obstetrics and Gynecology

## 2020-01-20 VITALS — BP 135/66 | HR 79 | Temp 98.3°F | Wt 177.5 lb

## 2020-01-20 DIAGNOSIS — Z5111 Encounter for antineoplastic chemotherapy: Secondary | ICD-10-CM | POA: Diagnosis not present

## 2020-01-20 DIAGNOSIS — C561 Malignant neoplasm of right ovary: Secondary | ICD-10-CM

## 2020-01-20 NOTE — Progress Notes (Signed)
Patient here today for follow up regarding ovarian cancer. Patient reports ongoing issues with neuropathy, numbness and difficulty with balance.

## 2020-01-26 ENCOUNTER — Telehealth: Payer: Self-pay

## 2020-01-26 ENCOUNTER — Telehealth: Payer: Self-pay | Admitting: *Deleted

## 2020-01-26 NOTE — Telephone Encounter (Signed)
Yes she can.

## 2020-01-26 NOTE — Telephone Encounter (Signed)
Pt calling; had complete hyst 08/19/2019; found to have ovarian cancer; is filling out information for [employer]; needs a lot of information.  [phone]  Pt states she was adv by someone else to get information for hospital records. Will call if she needs anything from [place].

## 2020-01-26 NOTE — Telephone Encounter (Signed)
Call returned to patient and advised of Dr Elroy Channel response that she can have her tooth fixed. She thanked me for calling

## 2020-01-26 NOTE — Telephone Encounter (Signed)
Patient called stating she completed her treatment 3 weeks ago and that she has lost a filling and wants to know if she can go to dentist to have it fixed. Please advise

## 2020-01-27 ENCOUNTER — Telehealth: Payer: Self-pay | Admitting: *Deleted

## 2020-01-27 NOTE — Telephone Encounter (Signed)
Patient called requesting a referral to Urology for urinary frequency and urgency at night. States she is up every 1 hour during night to void and having incontinence as well. She does NOT want to see Dr Glori Luis, but is willing to see one of his partners. Please advise

## 2020-01-27 NOTE — Telephone Encounter (Signed)
Carrie Roberson- this patient has also seen Zara Council NP at urology clinic. Would she be willing to see her? That would be a good starting step. If shannon feels Dr. Erlene Quan needs to be involved, DR. Erlene Quan will see her.

## 2020-01-27 NOTE — Telephone Encounter (Signed)
  Patient is followed by Dr Janese Banks.  M

## 2020-01-27 NOTE — Telephone Encounter (Signed)
Call returned to patient and advised that she can call Urology and schedule with Zara Council who can evaluate her and defer to Dr Erlene Quan if needed. Patient in agreement and she has agreed to do this

## 2020-01-28 ENCOUNTER — Ambulatory Visit: Payer: Medicare Other

## 2020-01-29 ENCOUNTER — Other Ambulatory Visit: Payer: Self-pay | Admitting: Oncology

## 2020-01-29 DIAGNOSIS — K521 Toxic gastroenteritis and colitis: Secondary | ICD-10-CM

## 2020-01-29 NOTE — Telephone Encounter (Signed)
I dont want to refill this one. She is done with chemo

## 2020-01-29 NOTE — Telephone Encounter (Signed)
   Ref Range & Units 3 mo ago 4 mo ago 5 mo ago  Magnesium 1.7 - 2.4 mg/dL 1.8  2.0 CM  1.8 CM   Comment: Performed at [facility], [address] [place], [place] [address]  Resulting Agency  [facility]      Specimen Collected: 10/13/19 08:56 Last Resulted: 10/13/19 09:53

## 2020-02-01 ENCOUNTER — Ambulatory Visit (INDEPENDENT_AMBULATORY_CARE_PROVIDER_SITE_OTHER): Payer: Medicare Other | Admitting: Physician Assistant

## 2020-02-01 ENCOUNTER — Encounter: Payer: Self-pay | Admitting: Physician Assistant

## 2020-02-01 ENCOUNTER — Other Ambulatory Visit: Payer: Self-pay

## 2020-02-01 VITALS — BP 139/84 | HR 87 | Ht 63.0 in | Wt 174.2 lb

## 2020-02-01 DIAGNOSIS — R350 Frequency of micturition: Secondary | ICD-10-CM

## 2020-02-01 DIAGNOSIS — N3946 Mixed incontinence: Secondary | ICD-10-CM

## 2020-02-01 DIAGNOSIS — N390 Urinary tract infection, site not specified: Secondary | ICD-10-CM | POA: Diagnosis not present

## 2020-02-01 MED ORDER — MIRABEGRON ER 50 MG PO TB24: 50.0000 mg | ORAL_TABLET | Freq: Every day | ORAL | 0 refills | Status: DC

## 2020-02-01 NOTE — Progress Notes (Signed)
02/01/2020 4:46 PM   Carrie Roberson 19-Feb-1946 [id number]  CC: Chief Complaint  Patient presents with  . Recurrent UTI   HPI: Carrie Roberson is a 74 y.o. 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   Today, patient reports a 2-week history of urinary frequency, worse at night.  She has also having occasional urinary leakage associated with urgency and increased intra-abdominal pressure that she states is worsened since undergoing chemotherapy.  She denies dysuria.  She has a history of constipation and diarrhea.  Her constipation has been worse recently and so she took Senokot and MiraLAX to treat this.  She now has diarrhea as a result of these.  She denies a history of snoring or sleep apnea.  She is s/p hysterectomy.  Notably, patient is concerned about her current urinary symptoms as her ovarian cancer originally presented with urinary complaints.  She would like to pursue repeat imaging at this time.  In-office UA today positive for trace-intact blood and trace leukocyte esterase; urine microscopy with 6-10 WBCs/HPF.  PMH: Past Medical History:  Diagnosis Date  . Anxiety   . Arthritis   . Family history of breast cancer   . Family history of leukemia   . GERD (gastroesophageal reflux disease)   . History of ischemic colitis 09/02/2017  . Hx of colonic polyp   . Hypertension    H/O-PT TAKEN OFF BY PCP AS OF 2019  . IBS (irritable bowel syndrome)   . Ovarian cancer ([place])   . Ovarian cyst   . Vaginal atrophy     Surgical History: Past Surgical History:  Procedure Laterality Date  . BREAST BIOPSY Left    needle bx/clip-neg  . CERVICAL BIOPSY  W/ LOOP ELECTRODE EXCISION    . CHOLECYSTECTOMY    . Colonoscopoy  2007, 2008, 2011  . COLONOSCOPY WITH PROPOFOL N/A 05/05/2018   Procedure: COLONOSCOPY WITH PROPOFOL;  Surgeon: Lin Landsman, MD;  Location: [facility] ENDOSCOPY;   Service: Gastroenterology;  Laterality: N/A;  . HYSTERECTOMY ABDOMINAL WITH SALPINGO-OOPHORECTOMY N/A 08/19/2019   Procedure: HYSTERECTOMY ABDOMINAL WITH SALPINGO-OOPHORECTOMY WITH PELVIC WASHINGS PERITONEAL BIOPSIES AND PARA AORTIC LYMPH NODE DISSECTION;  Surgeon: [name], MD;  Location: [facility] ORS;  Service: Gynecology;  Laterality: N/A;  . LEEP    . OMENTECTOMY N/A 08/19/2019   Procedure: OMENTECTOMY;  Surgeon: [name], MD;  Location: [facility] ORS;  Service: Gynecology;  Laterality: N/A;  . PORTA CATH INSERTION N/A 09/11/2019   Procedure: PORTA CATH INSERTION;  Surgeon: Algernon Huxley, MD;  Location: [facility];  Service: Cardiovascular;  Laterality: N/A;  . TUBAL LIGATION  1999    Home Medications:  Allergies as of 02/01/2020      Reactions   Citalopram Nausea And Vomiting   Ivp Dye [iodinated Diagnostic Agents] Hives, Swelling   hives   Meloxicam    unknown   Adhesive [tape] Rash   Amlodipine Rash   Ampicillin Rash   Did it involve swelling of the face/tongue/throat, SOB, or low BP? No Did it involve sudden or severe rash/hives, skin peeling, or any reaction on the inside of your mouth or nose? Yes Did you need to seek medical attention at a hospital or doctor's office? Yes When did it last happen? 10 + years If all above answers are "NO", may proceed with cephalosporin use.   Cefoxitin Rash   Estradiol Rash   Estropipate Rash  Olmesartan Medoxomil-hctz Rash   Sertraline Rash      Sulfa Antibiotics Rash      Medication List       Accurate as of February 01, 2020  4:46 PM. If you have any questions, ask your nurse or doctor.        STOP taking these medications   fluconazole 150 MG tablet Commonly known as: DIFLUCAN Stopped by: Debroah Loop, PA-C   nitrofurantoin (macrocrystal-monohydrate) 100 MG capsule Commonly known as: Macrobid Stopped by: Debroah Loop, PA-C   ondansetron 8 MG tablet Commonly known as: Zofran Stopped  by: Debroah Loop, PA-C   oxyCODONE-acetaminophen 5-325 MG tablet Commonly known as: PERCOCET/ROXICET Stopped by: Debroah Loop, PA-C     TAKE these medications   acetaminophen 500 MG tablet Commonly known as: TYLENOL Take 1,000 mg by mouth every 6 (six) hours as needed (for pain.).   amitriptyline 25 MG tablet Commonly known as: ELAVIL Take 2 tablets (50 mg total) by mouth at bedtime.   bacitracin 500 UNIT/GM ointment Apply 1 application topically 2 (two) times daily. Apply to affected area twice a day as directed.   benzocaine-resorcinol 5-2 % vaginal cream Commonly known as: VAGISIL Place 1 application vaginally daily as needed for itching or irritation.   cetirizine 10 MG tablet Commonly known as: ZYRTEC Take 10 mg by mouth daily as needed (allergies.).   dexamethasone 4 MG tablet Commonly known as: DECADRON Take 2 tablets (8 mg total) by mouth daily. Start the day after carboplatin chemotherapy for 3 days.   diphenhydrAMINE 25 MG tablet Commonly known as: BENADRYL Take 25 mg by mouth daily as needed for itching or allergies.   fluticasone 50 MCG/ACT nasal spray Commonly known as: FLONASE Place 2 sprays into both nostrils daily.   guaiFENesin 600 MG 12 hr tablet Commonly known as: MUCINEX Take 600 mg by mouth at bedtime as needed (congestion).   hydrocortisone cream 1 % Apply 1 application topically daily as needed for itching.   Klor-Con M10 10 MEQ tablet Generic drug: potassium chloride TAKE 1 TABLET (10 MEQ TOTAL) BY MOUTH 2 (TWO) TIMES DAILY.   lidocaine-prilocaine cream Commonly known as: EMLA Apply to affected area once   lisinopril 5 MG tablet Commonly known as: ZESTRIL TAKE 1 TABLET BY MOUTH EVERY DAY   LORazepam 0.5 MG tablet Commonly known as: Ativan Take 1 tablet (0.5 mg total) by mouth every 6 (six) hours as needed (Nausea/vomiting or anxiety).   Metamucil 0.52 g capsule Generic drug: psyllium Take 0.52 g by mouth in the  morning and at bedtime.   mirabegron ER 50 MG Tb24 tablet Commonly known as: MYRBETRIQ Take 1 tablet (50 mg total) by mouth daily. Started by: Debroah Loop, PA-C   omeprazole 20 MG capsule Commonly known as: PRILOSEC Take 20 mg by mouth daily as needed (acid reflux).   polyethylene glycol 17 g packet Commonly known as: MIRALAX / GLYCOLAX Take 17 g by mouth 2 (two) times daily.   prochlorperazine 10 MG tablet Commonly known as: COMPAZINE Take 1 tablet (10 mg total) by mouth every 6 (six) hours as needed (Nausea or vomiting).   rOPINIRole 1 MG tablet Commonly known as: REQUIP Take 1 tablet (1 mg total) by mouth at bedtime.   senna 8.6 MG tablet Commonly known as: SENOKOT Take 1 tablet by mouth daily as needed.   Slow-Mag 64 MG Tbec SR tablet Generic drug: magnesium chloride Take 1 tablet (64 mg total) by mouth daily.   triamcinolone cream 0.1 %  Commonly known as: KENALOG Apply 1 application topically 2 (two) times daily. Apply to affected area twice a day as directed.       Allergies:  Allergies  Allergen Reactions  . Citalopram Nausea And Vomiting  . Ivp Dye [Iodinated Diagnostic Agents] Hives and Swelling    hives  . Meloxicam     unknown  . Adhesive [Tape] Rash  . Amlodipine Rash  . Ampicillin Rash    Did it involve swelling of the face/tongue/throat, SOB, or low BP? No Did it involve sudden or severe rash/hives, skin peeling, or any reaction on the inside of your mouth or nose? Yes Did you need to seek medical attention at a hospital or doctor's office? Yes When did it last happen? 10 + years If all above answers are "NO", may proceed with cephalosporin use.  . Cefoxitin Rash  . Estradiol Rash  . Estropipate Rash  . Olmesartan Medoxomil-Hctz Rash  . Sertraline Rash       . Sulfa Antibiotics Rash    Family History: Family History  Problem Relation Age of Onset  . Heart failure Mother   . Atrial fibrillation Mother   . Hypertension  Mother   . Diabetes Father   . Heart attack Father   . Breast cancer Maternal Aunt 70  . Breast cancer Maternal Grandmother 80  . Breast cancer Cousin        2 mat cousins  . Hypertension Sister   . Hypertension Sister   . Arthritis Sister   . Leukemia Paternal Aunt   . Leukemia Paternal Uncle   . Brain cancer Paternal Aunt   . Other Brother 13       Drowning accident  . Liver cancer Maternal Uncle   . Alcohol abuse Maternal Uncle   . Prostate cancer Neg Hx   . Kidney cancer Neg Hx     Social History:   reports that she has never smoked. She has never used smokeless tobacco. She reports current alcohol use. She reports that she does not use drugs.  Physical Exam: BP 139/84 (BP Location: Left Arm, Patient Position: Sitting, Cuff Size: Large)   Pulse 87   Ht 5\' 3"  (1.6 m)   Wt 174 lb 3.2 oz (79 kg)   BMI 30.86 kg/m   Constitutional:  Alert and oriented, no acute distress, nontoxic appearing HEENT: [place], AT Cardiovascular: No clubbing, cyanosis, or edema Respiratory: Normal respiratory effort, no increased work of breathing Skin: No rashes, bruises or suspicious lesions Neurologic: Grossly intact, no focal deficits, moving all 4 extremities Psychiatric: Normal mood and affect  Laboratory Data: Results for orders placed or performed in visit on 02/01/20  Microscopic Examination   Urine  Result Value Ref Range   WBC, UA 0-5 0 - 5 /hpf   RBC 0-2 0 - 2 /hpf   Epithelial Cells (non renal) 0-10 0 - 10 /hpf   Renal Epithel, UA 0-10 (A) None seen /hpf   Bacteria, UA Few None seen/Few  Urinalysis, Complete  Result Value Ref Range   Specific Gravity, UA 1.015 1.005 - 1.030   pH, UA 7.0 5.0 - 7.5   Color, UA Yellow Yellow   Appearance Ur Hazy (A) Clear   Leukocytes,UA Trace (A) Negative   Protein,UA Negative Negative/Trace   Glucose, UA Negative Negative   Ketones, UA Negative Negative   RBC, UA Trace (A) Negative   Bilirubin, UA Negative Negative   Urobilinogen, Ur 0.2  0.2 - 1.0 mg/dL   Nitrite,  UA Negative Negative   Microscopic Examination See below:    Assessment & Plan:   1. Urinary frequency UA reassuring for infection today, will send for culture for further evaluation.  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  Offered her a trial of Myrbetriq at this time.  She is in agreement with this plan.  We will plan for symptom recheck with PVR in 4 weeks.  Given her history of urinary frequency with onset of ovarian cancer, I have reached out to Drs. Janese Banks and Berchuck to discuss her request for repeat imaging.  I will defer ordering ultrasound at this point pending their recommendations. - Urinalysis, Complete - CULTURE, URINE COMPREHENSIVE - mirabegron ER (MYRBETRIQ) 50 MG TB24 tablet; Take 1 tablet (50 mg total) by mouth daily.  Dispense: 28 tablet; Refill: 0 - Microscopic Examination  2. Mixed incontinence Both stress and urge features noted today.  Will reassess frequency on Myrbetriq.  Patient may benefit from pelvic floor PT for management of stress incontinence s/p hysterectomy.  Return in about 4 weeks (around 02/29/2020) for Symptom recheck with PVR with Larene Beach.  Debroah Loop, PA-C  [facility] [address], [place], [place] [address] [phone]

## 2020-02-02 LAB — URINALYSIS, COMPLETE
Bilirubin, UA: NEGATIVE
Glucose, UA: NEGATIVE
Ketones, UA: NEGATIVE
Nitrite, UA: NEGATIVE
Protein,UA: NEGATIVE
Specific Gravity, UA: 1.015 (ref 1.005–1.030)
Urobilinogen, Ur: 0.2 mg/dL (ref 0.2–1.0)
pH, UA: 7 (ref 5.0–7.5)

## 2020-02-02 LAB — MICROSCOPIC EXAMINATION

## 2020-02-04 LAB — CULTURE, URINE COMPREHENSIVE

## 2020-02-08 ENCOUNTER — Other Ambulatory Visit: Payer: Self-pay

## 2020-02-08 DIAGNOSIS — C561 Malignant neoplasm of right ovary: Secondary | ICD-10-CM

## 2020-02-08 DIAGNOSIS — R350 Frequency of micturition: Secondary | ICD-10-CM

## 2020-02-08 DIAGNOSIS — N3946 Mixed incontinence: Secondary | ICD-10-CM

## 2020-02-16 ENCOUNTER — Emergency Department
Admission: EM | Admit: 2020-02-16 | Discharge: 2020-02-16 | Disposition: A | Discharge status: Home / Self Care | Payer: Medicare Other | Attending: Emergency Medicine | Admitting: Emergency Medicine

## 2020-02-16 ENCOUNTER — Emergency Department: Payer: Medicare Other

## 2020-02-16 ENCOUNTER — Telehealth: Payer: Self-pay | Admitting: *Deleted

## 2020-02-16 ENCOUNTER — Other Ambulatory Visit: Payer: Self-pay

## 2020-02-16 ENCOUNTER — Encounter: Payer: Self-pay | Admitting: Emergency Medicine

## 2020-02-16 DIAGNOSIS — Z79899 Other long term (current) drug therapy: Secondary | ICD-10-CM | POA: Insufficient documentation

## 2020-02-16 DIAGNOSIS — K5790 Diverticulosis of intestine, part unspecified, without perforation or abscess without bleeding: Secondary | ICD-10-CM | POA: Insufficient documentation

## 2020-02-16 DIAGNOSIS — R350 Frequency of micturition: Secondary | ICD-10-CM | POA: Insufficient documentation

## 2020-02-16 DIAGNOSIS — Z9049 Acquired absence of other specified parts of digestive tract: Secondary | ICD-10-CM | POA: Diagnosis not present

## 2020-02-16 DIAGNOSIS — Z8543 Personal history of malignant neoplasm of ovary: Secondary | ICD-10-CM | POA: Insufficient documentation

## 2020-02-16 DIAGNOSIS — I10 Essential (primary) hypertension: Secondary | ICD-10-CM | POA: Diagnosis not present

## 2020-02-16 DIAGNOSIS — K5792 Diverticulitis of intestine, part unspecified, without perforation or abscess without bleeding: Secondary | ICD-10-CM

## 2020-02-16 DIAGNOSIS — R1030 Lower abdominal pain, unspecified: Secondary | ICD-10-CM | POA: Diagnosis not present

## 2020-02-16 DIAGNOSIS — R1084 Generalized abdominal pain: Secondary | ICD-10-CM | POA: Diagnosis not present

## 2020-02-16 DIAGNOSIS — Z90710 Acquired absence of both cervix and uterus: Secondary | ICD-10-CM | POA: Diagnosis not present

## 2020-02-16 DIAGNOSIS — K5732 Diverticulitis of large intestine without perforation or abscess without bleeding: Secondary | ICD-10-CM | POA: Diagnosis not present

## 2020-02-16 DIAGNOSIS — I700 Atherosclerosis of aorta: Secondary | ICD-10-CM | POA: Diagnosis not present

## 2020-02-16 LAB — COMPREHENSIVE METABOLIC PANEL
ALT: 23 U/L (ref 0–44)
AST: 21 U/L (ref 15–41)
Albumin: 4.2 g/dL (ref 3.5–5.0)
Alkaline Phosphatase: 102 U/L (ref 38–126)
Anion gap: 11 (ref 5–15)
BUN: 6 mg/dL — ABNORMAL LOW (ref 8–23)
CO2: 21 mmol/L — ABNORMAL LOW (ref 22–32)
Calcium: 10.6 mg/dL — ABNORMAL HIGH (ref 8.9–10.3)
Chloride: 105 mmol/L (ref 98–111)
Creatinine, Ser: 0.63 mg/dL (ref 0.44–1.00)
GFR calc Af Amer: >60 mL/min (ref >60)
GFR calc non Af Amer: >60 mL/min (ref >60)
Glucose, Bld: 130 mg/dL — ABNORMAL HIGH (ref 70–99)
Potassium: 3.7 mmol/L (ref 3.5–5.1)
Sodium: 137 mmol/L (ref 135–145)
Total Bilirubin: 0.7 mg/dL (ref 0.3–1.2)
Total Protein: 7.1 g/dL (ref 6.5–8.1)

## 2020-02-16 LAB — URINALYSIS, COMPLETE (UACMP) WITH MICROSCOPIC
Bacteria, UA: NONE SEEN
Bilirubin Urine: NEGATIVE
Glucose, UA: NEGATIVE mg/dL
Hgb urine dipstick: NEGATIVE
Ketones, ur: 5 mg/dL — AB
Leukocytes,Ua: NEGATIVE
Nitrite: NEGATIVE
Protein, ur: 30 mg/dL — AB
Specific Gravity, Urine: 1.016 (ref 1.005–1.030)
pH: 9 — ABNORMAL HIGH (ref 5.0–8.0)

## 2020-02-16 LAB — CBC
HCT: 42.8 % (ref 36.0–46.0)
Hemoglobin: 14.7 g/dL (ref 12.0–15.0)
MCH: 33.8 pg (ref 26.0–34.0)
MCHC: 34.3 g/dL (ref 30.0–36.0)
MCV: 98.4 fL (ref 80.0–100.0)
Platelets: 224 10*3/uL (ref 150–400)
RBC: 4.35 MIL/uL (ref 3.87–5.11)
RDW: 11.4 % — ABNORMAL LOW (ref 11.5–15.5)
WBC: 2.9 10*3/uL — ABNORMAL LOW (ref 4.0–10.5)
nRBC: 0 % (ref 0.0–0.2)

## 2020-02-16 MED ORDER — METRONIDAZOLE 500 MG PO TABS
500.0000 mg | ORAL_TABLET | Freq: Three times a day (TID) | ORAL | 0 refills | Status: AC
Start: 2020-02-16 — End: 2020-02-26

## 2020-02-16 MED ORDER — CIPROFLOXACIN HCL 500 MG PO TABS
500.0000 mg | ORAL_TABLET | Freq: Two times a day (BID) | ORAL | 0 refills | Status: AC
Start: 2020-02-16 — End: 2020-02-23

## 2020-02-16 MED ORDER — FLUCONAZOLE 150 MG PO TABS: 150.0000 mg | ORAL_TABLET | Freq: Every day | ORAL | 0 refills | Status: DC

## 2020-02-16 NOTE — ED Provider Notes (Signed)
[facility] Emergency Department Provider Note  ____________________________________________   First MD Initiated Contact with Patient 02/16/20 [phone]     (approximate)  I have reviewed the triage vital signs and the nursing notes.   HISTORY  Chief Complaint Abdominal Pain   HPI Carrie Roberson is a 74 y.o. female with a past medical history of anxiety, arthritis, GERD, HTN, and ovarian cancer status post chemo and radiation therapy was completed last month who presents for assessment of worsening suprapubic pressure and urinary frequency over the last several weeks. Patient also states she has constant burning but this is not specifically associated with urination. She states she was seen by urology who was concerned about possible overactive bladder and prescribed her a trial of myrbetriq but this is not clearly helping. She denies any other acute symptoms including fevers, chills, back pain, vomiting, acute diarrhea, blood in the stool, blood in the urine, rash, recent injuries, or other acute complaints. No other clear alleviating aggravating factors. She states this feels similar to how she felt when she was initially diagnosed with brain cancer.          Past Medical History:  Diagnosis Date  . Anxiety   . Arthritis   . Family history of breast cancer   . Family history of leukemia   . GERD (gastroesophageal reflux disease)   . History of ischemic colitis 09/02/2017  . Hx of colonic polyp   . Hypertension    H/O-PT TAKEN OFF BY PCP AS OF 2019  . IBS (irritable bowel syndrome)   . Ovarian cancer ([place])   . Ovarian cyst   . Vaginal atrophy     Patient Active Problem List   Diagnosis Date Noted  . Genetic testing 11/02/2019  . Weight loss, unintentional 10/16/2019  . Chemotherapy induced diarrhea 10/13/2019  . Orthostasis 10/13/2019  . Hypokalemia 10/13/2019  . Restless leg 10/13/2019  . 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 Iron deficiency  10/02/2019  . Anxiety associated with cancer diagnosis 10/02/2019  . Chemotherapy-induced nausea 10/02/2019  . Thyroid nodule 10/02/2019  . Hyponatremia 10/02/2019  . B12 deficiency 09/18/2019  . Family history of breast cancer   . Family history of leukemia   . Goals of care, counseling/discussion 09/07/2019  . Ovarian mass 08/19/2019  . 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) 08/19/2019  . 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 Cyst of ovary 07/15/2019  . Anxiety 09/02/2017  . 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 FH: diabetes mellitus 09/02/2017  . 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 Atypical nevus 09/02/2017  . IBS (irritable bowel syndrome) 09/02/2017  . Atrophic vaginitis 09/02/2017  . Chronic idiopathic urticaria 09/02/2017  . GERD (gastroesophageal reflux disease) 09/02/2017    Past Surgical History:  Procedure Laterality Date  . BREAST BIOPSY Left    needle bx/clip-neg  . CERVICAL BIOPSY  W/ LOOP ELECTRODE EXCISION    . CHOLECYSTECTOMY    . Colonoscopoy  2007, 2008, 2011  . COLONOSCOPY WITH PROPOFOL N/A 05/05/2018   Procedure: COLONOSCOPY WITH PROPOFOL;  Surgeon: Lin Landsman, MD;  Location: [facility] ENDOSCOPY;  Service: Gastroenterology;  Laterality: N/A;  . HYSTERECTOMY ABDOMINAL WITH SALPINGO-OOPHORECTOMY N/A 08/19/2019   Procedure: HYSTERECTOMY ABDOMINAL WITH SALPINGO-OOPHORECTOMY WITH PELVIC WASHINGS PERITONEAL BIOPSIES AND PARA AORTIC LYMPH NODE DISSECTION;  Surgeon: [name], MD;  Location: [facility] ORS;  Service: Gynecology;  Laterality: N/A;  . LEEP    .  OMENTECTOMY N/A 08/19/2019   Procedure: OMENTECTOMY;  Surgeon: [name], MD;  Location: [facility] ORS;  Service: Gynecology;  Laterality: N/A;  . PORTA CATH INSERTION N/A 09/11/2019   Procedure: PORTA CATH INSERTION;  Surgeon: Algernon Huxley, MD;  Location: [facility];  Service: Cardiovascular;   Laterality: N/A;  . [place]    Prior to Admission medications   Medication Sig Start Date End Date Taking? Authorizing Provider  acetaminophen (TYLENOL) 500 MG tablet Take 1,000 mg by mouth every 6 (six) hours as needed (for pain.).    [provider]  amitriptyline (ELAVIL) 25 MG tablet Take 2 tablets (50 mg total) by mouth at bedtime. Patient not taking: Reported on 02/01/2020 12/07/19   Sindy Guadeloupe, MD  bacitracin 500 UNIT/GM ointment Apply 1 application topically 2 (two) times daily. Apply to affected area twice a day as directed. Patient not taking: Reported on 02/01/2020 10/26/19   Verlon Au, NP  benzocaine-resorcinol (VAGISIL) 5-2 % vaginal cream Place 1 application vaginally daily as needed for itching or irritation. Patient not taking: Reported on 02/01/2020    [provider]  cetirizine (ZYRTEC) 10 MG tablet Take 10 mg by mouth daily as needed (allergies.).     [provider]  ciprofloxacin (CIPRO) 500 MG tablet Take 1 tablet (500 mg total) by mouth 2 (two) times daily for 7 days. 02/16/20 02/23/20  Lucrezia Starch, MD  dexamethasone (DECADRON) 4 MG tablet Take 2 tablets (8 mg total) by mouth daily. Start the day after carboplatin chemotherapy for 3 days. Patient not taking: Reported on 01/20/2020 11/16/19   Sindy Guadeloupe, MD  diphenhydrAMINE (BENADRYL) 25 MG tablet Take 25 mg by mouth daily as needed for itching or allergies. Patient not taking: Reported on 02/01/2020    [provider]  fluconazole (DIFLUCAN) 150 MG tablet Take 1 tablet (150 mg total) by mouth daily. 02/16/20   Lucrezia Starch, MD  fluticasone (FLONASE) 50 MCG/ACT nasal spray Place 2 sprays into both nostrils daily. Patient not taking: Reported on 02/01/2020 06/30/18   Lorin Picket, PA-C  guaiFENesin (MUCINEX) 600 MG 12 hr tablet Take 600 mg by mouth at bedtime as needed (congestion). Patient not taking: Reported on 02/01/2020    [provider]    hydrocortisone cream 1 % Apply 1 application topically daily as needed for itching. Patient not taking: Reported on 02/01/2020    [provider]  KLOR-CON M10 10 MEQ tablet TAKE 1 TABLET (10 MEQ TOTAL) BY MOUTH 2 (TWO) TIMES DAILY. Patient not taking: Reported on 02/01/2020 01/13/20   Sindy Guadeloupe, MD  lidocaine-prilocaine (EMLA) cream Apply to affected area once Patient not taking: Reported on 02/01/2020 09/07/19   Sindy Guadeloupe, MD  lisinopril (ZESTRIL) 5 MG tablet TAKE 1 TABLET BY MOUTH EVERY DAY Patient not taking: Reported on 02/01/2020 01/13/20   Sindy Guadeloupe, MD  LORazepam (ATIVAN) 0.5 MG tablet Take 1 tablet (0.5 mg total) by mouth every 6 (six) hours as needed (Nausea/vomiting or anxiety). Patient not taking: Reported on 02/01/2020 10/02/19   Verlon Au, NP  magnesium chloride (SLOW-MAG) 64 MG TBEC SR tablet Take 1 tablet (64 mg total) by mouth daily. Patient not taking: Reported on 02/01/2020 12/07/19   Sindy Guadeloupe, MD  metroNIDAZOLE (FLAGYL) 500 MG tablet Take 1 tablet (500 mg total) by mouth 3 (three) times daily for 10 days. 02/16/20 02/26/20  Lucrezia Starch, MD  mirabegron ER [facility]) 50  MG TB24 tablet Take 1 tablet (50 mg total) by mouth daily. 02/01/20   Vaillancourt, Aldona Bar, PA-C  omeprazole (PRILOSEC) 20 MG capsule Take 20 mg by mouth daily as needed (acid reflux). Patient not taking: Reported on 02/01/2020    [provider]  polyethylene glycol (MIRALAX / GLYCOLAX) 17 g packet Take 17 g by mouth 2 (two) times daily. Patient not taking: Reported on 02/01/2020    [provider]  prochlorperazine (COMPAZINE) 10 MG tablet Take 1 tablet (10 mg total) by mouth every 6 (six) hours as needed (Nausea or vomiting). Patient not taking: Reported on 02/01/2020 09/07/19   Sindy Guadeloupe, MD  psyllium (METAMUCIL) 0.52 g capsule Take 0.52 g by mouth in the morning and at bedtime. Patient not taking: Reported on 02/01/2020    [provider]   rOPINIRole (REQUIP) 1 MG tablet TAKE 1 TABLET (1 MG TOTAL) BY MOUTH AT BEDTIME. 02/02/20   Verlon Au, NP  senna (SENOKOT) 8.6 MG tablet Take 1 tablet by mouth daily as needed.  Patient not taking: Reported on 02/01/2020    [provider]  triamcinolone cream (KENALOG) 0.1 % Apply 1 application topically 2 (two) times daily. Apply to affected area twice a day as directed. Patient not taking: Reported on 02/01/2020 10/26/19   Verlon Au, NP    Allergies Citalopram, Ivp dye [iodinated diagnostic agents], Meloxicam, Adhesive [tape], Amlodipine, Ampicillin, Cefoxitin, Estradiol, Estropipate, Olmesartan medoxomil-hctz, Sertraline, and Sulfa antibiotics  Family History  Problem Relation Age of Onset  . Heart failure Mother   . Atrial fibrillation Mother   . Hypertension Mother   . Diabetes Father   . Heart attack Father   . Breast cancer Maternal Aunt 70  . Breast cancer Maternal Grandmother 80  . Breast cancer Cousin        2 mat cousins  . Hypertension Sister   . Hypertension Sister   . Arthritis Sister   . Leukemia Paternal Aunt   . Leukemia Paternal Uncle   . Brain cancer Paternal Aunt   . Other Brother 13       Drowning accident  . Liver cancer Maternal Uncle   . Alcohol abuse Maternal Uncle   . Prostate cancer Neg Hx   . Kidney cancer Neg Hx     Social History Social History   Tobacco Use  . Smoking status: Never Smoker  . Smokeless tobacco: Never Used  . Tobacco comment: smoking cessation materials not required  Vaping Use  . Vaping Use: Never used  Substance Use Topics  . Alcohol use: Yes    Comment: rarely  . Drug use: No    Review of Systems  Review of Systems  Constitutional: Negative for chills and fever.  HENT: Negative for sore throat.   Eyes: Negative for pain.  Respiratory: Negative for cough and stridor.   Cardiovascular: Negative for chest pain.  Gastrointestinal: Positive for abdominal pain ( supra-pubic pressure). Negative for  vomiting.  Genitourinary: Positive for dysuria, frequency and urgency.  Skin: Negative for rash.  Neurological: Negative for seizures, loss of consciousness and headaches.  Psychiatric/Behavioral: Negative for suicidal ideas.  All other systems reviewed and are negative.     ____________________________________________   PHYSICAL EXAM:  VITAL SIGNS: ED Triage Vitals [02/16/20 0822]  Enc Vitals Group     BP (!) 174/91     Pulse Rate 100     Resp 20     Temp 98.1 F (36.7 C)  Temp Source Oral     SpO2 98 %     Weight 174 lb 3.2 oz (79 kg)     Height 5\' 3"  (1.6 m)     Head Circumference      Peak Flow      Pain Score 0     Pain Loc      Pain Edu?      Excl. in [place]?    Vitals:   02/16/20 0822  BP: (!) 174/91  Pulse: 100  Resp: 20  Temp: 98.1 F (36.7 C)  SpO2: 98%   Physical Exam Vitals and nursing note reviewed.  Constitutional:      General: She is not in acute distress.    Appearance: She is well-developed.  HENT:     Head: Normocephalic and atraumatic.     Right Ear: External ear normal.     Left Ear: External ear normal.     Nose: Nose normal.     Mouth/Throat:     Mouth: Mucous membranes are moist.  Eyes:     Conjunctiva/sclera: Conjunctivae normal.  Cardiovascular:     Rate and Rhythm: Normal rate and regular rhythm.     Heart sounds: No murmur heard.   Pulmonary:     Effort: Pulmonary effort is normal. No respiratory distress.     Breath sounds: Normal breath sounds.  Abdominal:     Palpations: Abdomen is soft.     Tenderness: There is abdominal tenderness in the suprapubic area.  Musculoskeletal:     Cervical back: Neck supple.  Skin:    General: Skin is warm and dry.     Capillary Refill: Capillary refill takes less than 2 seconds.  Neurological:     Mental Status: She is alert and oriented to person, place, and time.  Psychiatric:        Mood and Affect: Mood normal.      ____________________________________________   LABS (all  labs ordered are listed, but only abnormal results are displayed)  Labs Reviewed  COMPREHENSIVE METABOLIC PANEL - Abnormal; Notable for the following components:      Result Value   CO2 21 (*)    Glucose, Bld 130 (*)    BUN 6 (*)    Calcium 10.6 (*)    All other components within normal limits  CBC - Abnormal; Notable for the following components:   WBC 2.9 (*)    RDW 11.4 (*)    All other components within normal limits  URINALYSIS, COMPLETE (UACMP) WITH MICROSCOPIC - Abnormal; Notable for the following components:   Color, Urine YELLOW (*)    APPearance HAZY (*)    pH 9.0 (*)    Ketones, ur 5 (*)    Protein, ur 30 (*)    All other components within normal limits  URINE CULTURE   ____________________________________________  ____________________________________________  RADIOLOGY  ED MD interpretation: Sigmoid diverticulitis.  No SBO, or abscess, or clear new abdominal mass.  Official radiology report(s): CT ABDOMEN PELVIS WO CONTRAST  Result Date: 02/16/2020 CLINICAL DATA:  Acute generalized abdominal pain. History of ovarian cancer. EXAM: CT ABDOMEN AND PELVIS WITHOUT CONTRAST TECHNIQUE: Multidetector CT imaging of the abdomen and pelvis was performed following the standard protocol without IV contrast. COMPARISON:  September 29, 2019. FINDINGS: Lower chest: No acute abnormality. Hepatobiliary: No focal liver abnormality is seen. Status post cholecystectomy. No biliary dilatation. Pancreas: Unremarkable. No pancreatic ductal dilatation or surrounding inflammatory changes. Spleen: Normal in size without focal abnormality. Adrenals/Urinary Tract: Adrenal  glands are unremarkable. Kidneys are normal, without renal calculi, focal lesion, or hydronephrosis. Bladder is unremarkable. Stomach/Bowel: Stomach is within normal limits. Appendix appears normal. No abnormal bowel dilatation is noted. Sigmoid diverticulosis is noted with focal inflammatory changes seen best on image number 73 of  series 2 which may represent mild diverticulitis. Vascular/Lymphatic: Aortic atherosclerosis. No enlarged abdominal or pelvic lymph nodes. Reproductive: Status post hysterectomy. No adnexal masses. Other: No abdominal wall hernia or abnormality. No abdominopelvic ascites. Musculoskeletal: No acute or significant osseous findings. IMPRESSION: 1. Possible focal diverticulitis seen in the sigmoid colon. No definite abscess is noted. Aortic Atherosclerosis (ICD10-I70.0). Electronically Signed   By: Marijo Conception M.D.   On: 02/16/2020 10:47    ____________________________________________   PROCEDURES  Procedure(s) performed (including Critical Care):  Procedures   ____________________________________________   INITIAL IMPRESSION / ASSESSMENT AND PLAN / ED COURSE        Overall is unclear if patient's urinary frequency is related to overactive bladder diagnosed by urology versus other etiology.  UA is not consistent with cystitis CT shows no abnormal findings regarding patient's bladder pressure findings consistent with diverticulitis.  It is possible that diverticulitis is contributing to her abdominal pain and urinary frequency although explained to the patient that while it would be prudent to treat her diverticulitis with antibiotics I do think she warrants close follow-up with urology as well and that she should make an appointment soon as possible to be seen for her frequency.  No other acute findings on CT including SBO or abscess.  Did discuss with patient that her blood pressure was elevated today and should be rechecked her PCP later this week.  Given absence of clear abscess or perforation of diverticulitis with otherwise stable vital signs no other acute pathology at this time low suspicion for sepsis I would patient is here for discharge with plan for close outpatient follow-up.  Rx written for Cipro and Flagyl.  Discharged stable condition.  Strict return precautions advised and  discussed.          ____________________________________________   FINAL CLINICAL IMPRESSION(S) / ED DIAGNOSES  Final diagnoses:  Urinary frequency  Lower abdominal pain  Diverticulitis  Hypertension, unspecified type     ED Discharge Orders         Ordered    metroNIDAZOLE (FLAGYL) 500 MG tablet  3 times daily     Discontinue  Reprint     02/16/20 1202    ciprofloxacin (CIPRO) 500 MG tablet  2 times daily     Discontinue  Reprint     02/16/20 1202    fluconazole (DIFLUCAN) 150 MG tablet  Daily     Discontinue  Reprint     02/16/20 1244           Note:  This document was prepared using Dragon voice recognition software and may include unintentional dictation errors.   Lucrezia Starch, MD 02/16/20 [phone]

## 2020-02-16 NOTE — Telephone Encounter (Signed)
Notified patient as instructed, patient pleased. Discussed follow-up appointments for 03/08/2020.  patient agrees

## 2020-02-16 NOTE — ED Triage Notes (Signed)
Pt here for bladder pressure and urinary urgency.  Has had problems with this since finishing chemo about one month ago.  Has seen urology for this and put on myrbetriq a couple weeks ago but not helping. Urgency worse when laying down.  Sx have been worse over last week or so.  Pt reports similar feeling when dx with her CA r/t cyst pushing on organs. No fever. No NVD

## 2020-02-16 NOTE — Telephone Encounter (Signed)
/  Patient was seen in the hospital today and the ed said that she needed to see you sooner Than her next appt on /,

## 2020-02-16 NOTE — Telephone Encounter (Signed)
She was diagnosed with diverticulitis today; no acute urologic findings and UA reassuring for infection.  I would like her to complete her prescribed antibiotics before seeing her, as it is possible that colonic inflammation is causing her bladder pressure.  Okay to move her appointment up to 2 weeks from now, circa 8/10.   Please counsel her to continue Myrbetriq daily.  This medication takes at least 2 weeks to start causing symptomatic relief; I would like her to take it for a full month before determining therapeutic effect.

## 2020-02-17 ENCOUNTER — Ambulatory Visit
Admission: RE | Admit: 2020-02-17 | Discharge: 2020-02-17 | Disposition: A | Discharge status: Home / Self Care | Payer: Medicare Other | Source: Ambulatory Visit | Attending: Oncology | Admitting: Oncology

## 2020-02-17 ENCOUNTER — Telehealth: Payer: Self-pay

## 2020-02-17 ENCOUNTER — Other Ambulatory Visit: Payer: Self-pay

## 2020-02-17 DIAGNOSIS — Z1231 Encounter for screening mammogram for malignant neoplasm of breast: Secondary | ICD-10-CM | POA: Insufficient documentation

## 2020-02-17 DIAGNOSIS — Z9289 Personal history of other medical treatment: Secondary | ICD-10-CM

## 2020-02-17 DIAGNOSIS — C561 Malignant neoplasm of right ovary: Secondary | ICD-10-CM

## 2020-02-17 LAB — URINE CULTURE: Special Requests: NORMAL

## 2020-02-17 NOTE — Telephone Encounter (Signed)
Survivorship Care Plan visit completed.  Treatment summary reviewed and mailed to patient.  ASCO answers booklet reviewed and mailed to patient.  CARE program and Cancer Transitions discussed with patient along with other resources cancer center offers to patients and caregivers.  Patient verbalized understanding.  SCP packet mailed. Pt signed up for Cancer Transitions.  Patient would like to come in person to see Beckey Rutter NP tomorrow  In  the Survivorship Clinic.  Encouraged patient to call for any questions or concerns.

## 2020-02-18 ENCOUNTER — Inpatient Hospital Stay: Payer: Medicare Other | Attending: Nurse Practitioner | Admitting: Nurse Practitioner

## 2020-02-18 ENCOUNTER — Ambulatory Visit: Payer: Medicare Other | Admitting: Nurse Practitioner

## 2020-02-18 DIAGNOSIS — F419 Anxiety disorder, unspecified: Secondary | ICD-10-CM | POA: Diagnosis not present

## 2020-02-18 DIAGNOSIS — I10 Essential (primary) hypertension: Secondary | ICD-10-CM | POA: Diagnosis not present

## 2020-02-18 DIAGNOSIS — Z803 Family history of malignant neoplasm of breast: Secondary | ICD-10-CM | POA: Diagnosis not present

## 2020-02-18 DIAGNOSIS — C561 Malignant neoplasm of right ovary: Secondary | ICD-10-CM

## 2020-02-18 DIAGNOSIS — C801 Malignant (primary) neoplasm, unspecified: Secondary | ICD-10-CM

## 2020-02-18 DIAGNOSIS — T451X5A Adverse effect of antineoplastic and immunosuppressive drugs, initial encounter: Secondary | ICD-10-CM | POA: Diagnosis not present

## 2020-02-18 DIAGNOSIS — Z7952 Long term (current) use of systemic steroids: Secondary | ICD-10-CM | POA: Diagnosis not present

## 2020-02-18 DIAGNOSIS — F418 Other specified anxiety disorders: Secondary | ICD-10-CM | POA: Diagnosis not present

## 2020-02-18 DIAGNOSIS — Z79899 Other long term (current) drug therapy: Secondary | ICD-10-CM | POA: Insufficient documentation

## 2020-02-18 DIAGNOSIS — M1990 Unspecified osteoarthritis, unspecified site: Secondary | ICD-10-CM | POA: Insufficient documentation

## 2020-02-18 DIAGNOSIS — G620 Drug-induced polyneuropathy: Secondary | ICD-10-CM | POA: Diagnosis not present

## 2020-02-18 DIAGNOSIS — Z9221 Personal history of antineoplastic chemotherapy: Secondary | ICD-10-CM | POA: Diagnosis not present

## 2020-02-18 MED ORDER — LORAZEPAM 0.5 MG PO TABS: 0.5000 mg | ORAL_TABLET | Freq: Four times a day (QID) | ORAL | 0 refills | Status: DC | PRN

## 2020-02-18 MED ORDER — DULOXETINE HCL 60 MG PO CPEP: 60.0000 mg | ORAL_CAPSULE | Freq: Every day | ORAL | 0 refills | Status: DC

## 2020-02-18 NOTE — Patient Instructions (Signed)
[name], MD [address]  [place]  [place], [place] [address]  Main: [phone]  Fax: [phone]

## 2020-02-18 NOTE — Progress Notes (Signed)
Survivorship Clinic Consult Note [facility]  [phone] [phone]  Patient Care Team: Juline Patch, MD as PCP - General (Family Medicine) Lin Landsman, MD as Consulting Physician (Gastroenterology) Clent Jacks, RN as Oncology Nurse Navigator Mellody Drown, MD as Referring Physician (Obstetrics) Sindy Guadeloupe, MD as Consulting Physician (Oncology) [name], MD as Consulting Physician (Obstetrics and Gynecology)   Name of the patient: Carrie Roberson  [id number]  08/21/1945   Date of visit: 02/18/20  CLINIC:  Survivorship   REASON FOR VISIT:  Routine follow-up post-treatment for a recent history of ovarian cancer  BRIEF ONCOLOGIC HISTORY:  Oncology History  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[place])  08/19/2019 Initial Diagnosis   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[place])   09/07/2019 Cancer Staging   Staging form: Ovary, Fallopian Tube, and Primary Peritoneal Carcinoma, AJCC 8th Edition - Pathologic stage from 09/07/2019: FIGO Stage IC2, calculated as Stage IC (pT1c2, pN0, cM0) - Signed by Sindy Guadeloupe, MD on 09/07/2019   09/15/2019 -  Chemotherapy   The patient had dexamethasone (DECADRON) 4 MG tablet, 8 mg, Oral, Daily, 1 of 1 cycle, Start date: 11/16/2019, End date: -- palonosetron (ALOXI) injection 0.25 mg, 0.25 mg, Intravenous,  Once, 6 of 6 cycles Administration: 0.25 mg (09/15/2019), 0.25 mg (10/06/2019), 0.25 mg (10/27/2019), 0.25 mg (11/17/2019), 0.25 mg (12/08/2019), 0.25 mg (01/05/2020) pegfilgrastim (NEULASTA ONPRO KIT) injection 6 mg, 6 mg, Subcutaneous, Once, 6 of 6 cycles Administration: 6 mg (09/15/2019), 6 mg (10/06/2019), 6 mg (10/27/2019), 6 mg (11/17/2019), 6 mg (12/08/2019), 6 mg (01/05/2020) CARBOplatin (PARAPLATIN) 350 mg in sodium chloride 0.9 % 250 mL chemo infusion, 350 mg (100 % of original dose 354.8 mg), Intravenous,  Once, 6 of 6 cycles Dose modification:   (original dose 354.8 mg, Cycle 1),    (original dose 354.8 mg, Cycle 6),   (original dose 354.8 mg, Cycle 6) Administration: 350 mg (09/15/2019), 350 mg (10/06/2019), 350 mg (10/27/2019), 350 mg (11/17/2019), 350 mg (12/08/2019), 350 mg (01/05/2020) fosaprepitant (EMEND) 150 mg in sodium chloride 0.9 % 145 mL IVPB, 150 mg, Intravenous,  Once, 6 of 6 cycles Administration: 150 mg (09/15/2019), 150 mg (10/06/2019), 150 mg (10/27/2019), 150 mg (11/17/2019), 150 mg (12/08/2019), 150 mg (01/05/2020) PACLitaxel (TAXOL) 336 mg in sodium chloride 0.9 % 500 mL chemo infusion (> 76m/m2), 175 mg/m2 = 336 mg, Intravenous,  Once, 6 of 6 cycles Administration: 336 mg (09/15/2019), 336 mg (10/06/2019), 336 mg (10/27/2019), 336 mg (11/17/2019), 336 mg (12/08/2019), 336 mg (01/05/2020)  for chemotherapy treatment.    10/29/2019 Genetic Testing   BRCA2 p.[id number] found on the cancerNext germline panel.  The CancerNext gene panel offered by [employer] sequencing and rearrangement analysis for the following 34 genes:   APC, ATM, BARD1, BMPR1A, BRCA1, BRCA2, BRIP1, CDH1, CDK4, CDKN2A, CHEK2, DICER1, HOXB13, EPCAM, GREM1, MLH1, MRE11A, MSH2, MSH6, MUTYH, NBN, NF1, PALB2, PMS2, POLD1, POLE, PTEN, RAD50, RAD51C, RAD51D, SMAD4, SMARCA4, STK11, and TP53.  The report date is 10/29/2019.  TumorHRD testing was negative.  This suggests that the patient's tumor is not more susceptible to PARP inhibitor.     HISTORY OF PRESENTING ILLNESS: MChristiana Roberson is a 74y.o. year old female patient who presents to the survivorship clinic today for initial meeting to review her survivorship care plan detailing her treatment course for ovarian cancer, as well as monitoring long-term side effects of that treatment, education regarding health maintenance, screening, and overall wellness and health promotion. We discussed that  cancer survivorship begins at start of diagnosis and can include those who have no signs of cancer after finishing treatment as well as those living with, through, and  beyond cancer.   Overall, she reports feeling well since completing treatment.  She offers no specific complaints today.   REVIEW OF SYSTEMS:  Review of Systems  Constitutional: Negative for chills, fever, malaise/fatigue and weight loss.  HENT: Negative for hearing loss, nosebleeds, sore throat and tinnitus.   Eyes: Negative for blurred vision and double vision.  Respiratory: Negative for cough, hemoptysis, shortness of breath and wheezing.   Cardiovascular: Negative for chest pain, palpitations and leg swelling.  Gastrointestinal: Negative for abdominal pain, blood in stool, constipation, diarrhea, melena, nausea and vomiting.  Genitourinary: Negative for dysuria and urgency.  Musculoskeletal: Negative for back pain, falls, joint pain and myalgias.  Skin: Negative for itching and rash.  Neurological: Negative for dizziness, tingling, sensory change, loss of consciousness, weakness and headaches.  Endo/Heme/Allergies: Negative for environmental allergies. Does not bruise/bleed easily.  Psychiatric/Behavioral: Negative for depression. The patient is not nervous/anxious and does not have insomnia.   Breast: She performs self breast exams.  A 14-point review of systems was completed and was negative, except as noted above.   ONCOLOGY TREATMENT TEAM:  1. Gyn-Oncologist:  Dr. Fransisca Connors 2. Medical Oncologist: Dr Janese Banks 3. Gynecologist: Dr. Georgianne Fick     PAST MEDICAL/SURGICAL HISTORY:  Past Medical History:  Diagnosis Date  . Anxiety   . Arthritis   . Family history of breast cancer   . Family history of leukemia   . GERD (gastroesophageal reflux disease)   . History of ischemic colitis 09/02/2017  . Hx of colonic polyp   . Hypertension    H/O-PT TAKEN OFF BY PCP AS OF 2019  . IBS (irritable bowel syndrome)   . Ovarian cancer ([place])   . Ovarian cyst   . Personal history of chemotherapy 2021   6 treatments  . Vaginal atrophy    Past Surgical History:  Procedure Laterality Date  .  ABDOMINAL HYSTERECTOMY    . BREAST BIOPSY Left    needle bx/clip-neg  . CERVICAL BIOPSY  W/ LOOP ELECTRODE EXCISION    . CHOLECYSTECTOMY    . Colonoscopoy  2007, 2008, 2011  . COLONOSCOPY WITH PROPOFOL N/A 05/05/2018   Procedure: COLONOSCOPY WITH PROPOFOL;  Surgeon: Lin Landsman, MD;  [facility] ENDOSCOPY;  Service: Gastroenterology;  Laterality: N/A;  . HYSTERECTOMY ABDOMINAL WITH SALPINGO-OOPHORECTOMY N/A 08/19/2019   Procedure: HYSTERECTOMY ABDOMINAL WITH SALPINGO-OOPHORECTOMY WITH PELVIC WASHINGS PERITONEAL BIOPSIES AND PARA AORTIC LYMPH NODE DISSECTION;  Surgeon: [name], MD;  Location: [facility] ORS;  Service: Gynecology;  Laterality: N/A;  . LEEP    . OMENTECTOMY N/A 08/19/2019   Procedure: OMENTECTOMY;  Surgeon: [name], MD;  Location: [facility] ORS;  Service: Gynecology;  Laterality: N/A;  . OOPHORECTOMY    . PORTA CATH INSERTION N/A 09/11/2019   Procedure: PORTA CATH INSERTION;  Surgeon: Algernon Huxley, MD;  Location: [place] CV LAB;  Service: Cardiovascular;  Laterality: N/A;  . TUBAL LIGATION  1999     ALLERGIES:  Allergies  Allergen Reactions  . Citalopram Nausea And Vomiting  . Ivp Dye [Iodinated Diagnostic Agents] Hives and Swelling    hives  . Meloxicam     unknown  . Adhesive [Tape] Rash  . Amlodipine Rash  . Ampicillin Rash    Did it involve swelling of the face/tongue/throat, SOB, or low BP? No Did it involve sudden or severe  rash/hives, skin peeling, or any reaction on the inside of your mouth or nose? Yes Did you need to seek medical attention at a hospital or doctor's office? Yes When did it last happen? 10 + years If all above answers are "NO", may proceed with cephalosporin use.  . Cefoxitin Rash  . Estradiol Rash  . Estropipate Rash  . Olmesartan Medoxomil-Hctz Rash  . Sertraline Rash       . Sulfa Antibiotics Rash     CURRENT MEDICATIONS:  Outpatient Encounter Medications as of 02/18/2020  Medication Sig Note  .  acetaminophen (TYLENOL) 500 MG tablet Take 1,000 mg by mouth every 6 (six) hours as needed (for pain.).   Marland Kitchen amitriptyline (ELAVIL) 25 MG tablet Take 2 tablets (50 mg total) by mouth at bedtime. (Patient not taking: Reported on 02/01/2020)   . bacitracin 500 UNIT/GM ointment Apply 1 application topically 2 (two) times daily. Apply to affected area twice a day as directed. (Patient not taking: Reported on 02/01/2020)   . benzocaine-resorcinol (VAGISIL) 5-2 % vaginal cream Place 1 application vaginally daily as needed for itching or irritation. (Patient not taking: Reported on 02/01/2020)   . cetirizine (ZYRTEC) 10 MG tablet Take 10 mg by mouth daily as needed (allergies.).    Marland Kitchen ciprofloxacin (CIPRO) 500 MG tablet Take 1 tablet (500 mg total) by mouth 2 (two) times daily for 7 days.   Marland Kitchen dexamethasone (DECADRON) 4 MG tablet Take 2 tablets (8 mg total) by mouth daily. Start the day after carboplatin chemotherapy for 3 days. (Patient not taking: Reported on 01/20/2020)   . diphenhydrAMINE (BENADRYL) 25 MG tablet Take 25 mg by mouth daily as needed for itching or allergies. (Patient not taking: Reported on 02/01/2020)   . fluconazole (DIFLUCAN) 150 MG tablet Take 1 tablet (150 mg total) by mouth daily.   . fluticasone (FLONASE) 50 MCG/ACT nasal spray Place 2 sprays into both nostrils daily. (Patient not taking: Reported on 02/01/2020)   . guaiFENesin (MUCINEX) 600 MG 12 hr tablet Take 600 mg by mouth at bedtime as needed (congestion). (Patient not taking: Reported on 02/01/2020)   . hydrocortisone cream 1 % Apply 1 application topically daily as needed for itching. (Patient not taking: Reported on 02/01/2020)   . KLOR-CON M10 10 MEQ tablet TAKE 1 TABLET (10 MEQ TOTAL) BY MOUTH 2 (TWO) TIMES DAILY. (Patient not taking: Reported on 02/01/2020)   . lidocaine-prilocaine (EMLA) cream Apply to affected area once (Patient not taking: Reported on 02/01/2020) 09/09/2019: Patient to begin post operatively once patient began  chemotherapy   . lisinopril (ZESTRIL) 5 MG tablet TAKE 1 TABLET BY MOUTH EVERY DAY (Patient not taking: Reported on 02/01/2020)   . LORazepam (ATIVAN) 0.5 MG tablet Take 1 tablet (0.5 mg total) by mouth every 6 (six) hours as needed (Nausea/vomiting or anxiety). (Patient not taking: Reported on 02/01/2020)   . magnesium chloride (SLOW-MAG) 64 MG TBEC SR tablet Take 1 tablet (64 mg total) by mouth daily. (Patient not taking: Reported on 02/01/2020)   . metroNIDAZOLE (FLAGYL) 500 MG tablet Take 1 tablet (500 mg total) by mouth 3 (three) times daily for 10 days.   . mirabegron ER (MYRBETRIQ) 50 MG TB24 tablet Take 1 tablet (50 mg total) by mouth daily.   Marland Kitchen omeprazole (PRILOSEC) 20 MG capsule Take 20 mg by mouth daily as needed (acid reflux). (Patient not taking: Reported on 02/01/2020)   . polyethylene glycol (MIRALAX / GLYCOLAX) 17 g packet Take 17 g by mouth 2 (two) times  daily. (Patient not taking: Reported on 02/01/2020)   . prochlorperazine (COMPAZINE) 10 MG tablet Take 1 tablet (10 mg total) by mouth every 6 (six) hours as needed (Nausea or vomiting). (Patient not taking: Reported on 02/01/2020)   . psyllium (METAMUCIL) 0.52 g capsule Take 0.52 g by mouth in the morning and at bedtime. (Patient not taking: Reported on 02/01/2020)   . rOPINIRole (REQUIP) 1 MG tablet TAKE 1 TABLET (1 MG TOTAL) BY MOUTH AT BEDTIME.   Marland Kitchen senna (SENOKOT) 8.6 MG tablet Take 1 tablet by mouth daily as needed.  (Patient not taking: Reported on 02/01/2020)   . triamcinolone cream (KENALOG) 0.1 % Apply 1 application topically 2 (two) times daily. Apply to affected area twice a day as directed. (Patient not taking: Reported on 02/01/2020)    Facility-Administered Encounter Medications as of 02/18/2020  Medication  . sodium chloride flush (NS) 0.9 % injection 10 mL     ONCOLOGIC FAMILY HISTORY:  Family History  Problem Relation Age of Onset  . Heart failure Mother   . Atrial fibrillation Mother   . Hypertension Mother   .  Diabetes Father   . Heart attack Father   . Breast cancer Maternal Aunt 70  . Breast cancer Maternal Grandmother 80  . Breast cancer Cousin        2 mat cousins  . Hypertension Sister   . Hypertension Sister   . Arthritis Sister   . Leukemia Paternal Aunt   . Leukemia Paternal Uncle   . Brain cancer Paternal Aunt   . Other Brother 13       Drowning accident  . Liver cancer Maternal Uncle   . Alcohol abuse Maternal Uncle   . Prostate cancer Neg Hx   . Kidney cancer Neg Hx   . BRCA 1/2 Neg Hx      GENETIC COUNSELING/TESTING: Genetic testing reported out on October 29, 2019 through the TumorNext HRD+CancerNext cancer panel found no pathogenic mutations. The CancerNext gene panel offered by [employer] includes sequencing and rearrangement analysis for the following 34 genes:   APC, ATM, BARD1, BMPR1A, BRCA1, BRCA2, BRIP1, CDH1, CDK4, CDKN2A, CHEK2, DICER1, HOXB13, EPCAM, GREM1, MLH1, MRE11A, MSH2, MSH6, MUTYH, NBN, NF1, PALB2, PMS2, POLD1, POLE, PTEN, RAD50, RAD51C, RAD51D, SMAD4, SMARCA4, STK11, and TP53. The test report has been scanned into EPIC and is located under the Molecular Pathology section of the Results Review tab.HRD testing was also negative which suggests that her ovarian tumor would not be more sensitive to PARP inhibitor.  BRCA2 p.[id number] found on the cancerNext germline panel.   SOCIAL HISTORY:  Social History   Socioeconomic History  . Marital status: Single    Spouse name: Not on file  . Number of children: 0  . Years of education: Not on file  . Highest education level: Associate degree: academic program  Occupational History  . Occupation: Retired  Tobacco Use  . Smoking status: Never Smoker  . Smokeless tobacco: Never Used  . Tobacco comment: smoking cessation materials not required  Vaping Use  . Vaping Use: Never used  Substance and Sexual Activity  . Alcohol use: Yes    Comment: rarely  . Drug use: No  . Sexual activity: Never    Birth  control/protection: Post-menopausal  Other Topics Concern  . Not on file  Social History Narrative  . Not on file   Social Determinants of Health   Financial Resource Strain:   . Difficulty of Paying Living Expenses:   Food  Insecurity:   . Worried About Charity fundraiser in the Last Year:   . Arboriculturist in the Last Year:   Transportation Needs:   . Film/video editor (Medical):   Marland Kitchen Lack of Transportation (Non-Medical):   Physical Activity:   . Days of Exercise per Week:   . Minutes of Exercise per Session:   Stress:   . Feeling of Stress :   Social Connections:   . Frequency of Communication with Friends and Family:   . Frequency of Social Gatherings with Friends and Family:   . Attends Religious Services:   . Active Member of Clubs or Organizations:   . Attends Archivist Meetings:   Marland Kitchen Marital Status:     PHYSICAL EXAMINATION:  General: Well-developed, well-nourished, no acute distress. Eyes: Pink conjunctiva, anicteric sclera. Lungs: No audible wheezing or cough Abdomen: Soft, nontender, nondistended. No guarding.  Musculoskeletal: ambulatory w/o aids Neuro: Alert, answering all questions appropriately. Skin: No rashes or petechiae noted. Psych: anxious affect.   LABORATORY DATA:  None performed today. Prior labs including prior tumor markers were independently reviewed by myself and discussed with patient. CA 125 was not elevated at time of diagnosis and therefore, limited usefulness as tumor marker. HE4 was normal. Postmenopausal ROMA score 1.21   DIAGNOSTIC IMAGING:  None for this visit.     ASSESSMENT AND PLAN:  Ms.. Mikkelsen is a pleasant 74 y.o. 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 She presents to the Survivorship Clinic for  our initial meeting and routine follow-up post-completion of treatment for ovarian  cancer.   1. Stage IC2 ovarian cancer:  Ms. Montella is continuing to recover from definitive treatment for ovarian cancer. She will follow-up with her medical oncologist, Dr. Janese Banks in August 2021 with history and physical exam per surveillance protocol.  We discussed surveillance protocols per NCCN guidelines including visits with pelvic exams every 2-4 months for the first 2 years then 3-6 months for next 3 years, then annually thereafter.  Surveillance imaging is not generally warranted unless clinically indicated and would be based on symptoms or tumor markers such as CA-125, or HE4 were elevated though limited usefulness given low levels at time of diagnosis. We discussed that history and physicals will be focused on detecting signs of cancer recurrence or of new cancers however routine Pap test is not recommended.  I advised that there is a chance that the cancer may come back for spread to other parts of the body and that the risk is highest in the first 2 to 3 years after treatment but remains elevated for first 5 years following treatment.  After 5 years it is recommended that she have careful history and physical exam including pelvic exam every 12 months for the rest of her life.  We discussed specific symptoms concerning for recurrent disease such as vaginal bleeding, rectal bleeding, bloating, weight loss without effort, new and persistent pain, new and persistent fatigue, new leg swelling, new masses (new lumps or bumps), new and persistent cough, new and persistent nausea and vomiting, and any other concerns if she feels something is 'not right', then to contact the clinic.   Genetic testing: germline and somatic testing as above.   We discussed that often during survivorship meetings patients want to know what they can do  to prevent their cancer from coming back.  We discussed things such as not smoking, eating well,  exercising regularly, and staying at a healthy weight might help, but no one knows for sure, however, we do note that these types of changes can has a positive impact on your health and extend beyond your risk of ovarian and other cancers.  I advised that so far no dietary supplements including vitamins, minerals, and herbal products have been shown to clearly help lower the risk of ovarian cancer from progressing her coming back and that dietary supplements are not regulated like medications in the [place] meaning that they do not have to be proven effective or safe before being sold.  Today, a comprehensive survivorship care plan and treatment summary was reviewed with the patient today detailing her cancer diagnosis, treatment course, potential late/long-term effects of treatment, appropriate follow-up care with recommendations for the future, and patient education resources.  A copy of this summary, along with a letter will be sent to the patient's primary care provider via mail/fax/In Basket message after today's visit.    2. 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 Start duloxetine 60 mg daily. I will also refill her ativan for short acting anxiety control. Hope to wean off/minimize as duloxetine maximized.   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 Elected for duloxetine in setting of anxiety as above. Can consider adding other medications vs acupuncture vs scrambler therapy.   4. Port-a-Cath: Patient had port placed for administration of chemotherapy and remains in place at this time. Discussed the need to flush the port every 6 to 8 weeks- 8-12 weeks during covid pandemic. We also discussed that her medical oncologist will likely consider the removal of the port when she is 1 year post treatment.  Patient verbalizes understanding and agrees to comply with port flush schedule.  5 Potential Common Side Effects of treatment: We  discussed some common side effects of treatment may include postsurgical pain, fatigue, lymphedema, menopause, changes in sexuality, pain with intercourse, vaginal dryness, depression/anxiety, and numbness/tingling.  We discussed that if she experiences these to please contact clinic about available therapies that may help improve her symptoms.  - Leg Swelling: Minimal to pronounced lower leg swelling can occur.  Symptom control with compression hose, lymphedema massage, or specialist physical therapy can improve the symptoms and to please notify clinic if symptoms occur. Patient does not complain of this today.   - Sexual intimacy issues: Vaginal dryness, tightening, and scarring at the top of the vagina causing discomfort can occur.  Use of a lubricants, vaginal moisturizers, and dilators can help prevent or improve vaginal symptoms but if symptoms persist to please contact the clinic for further evaluation and discussion of additional available therapies.    - Emotional Health: Emotional health is important to your overall wellbeing.  Many women who are treated for cancer expressed concern of body image and intimacy following treatment.  I encouraged her to contact clinic if she is experiencing these feelings as we have multiple treatment modalities to help improve these feelings.   - Bone health:  Given her age, history of ovarian cancer, and her treatment regimens, she is at risk for bone demineralization. She was encouraged to increase her consumption of foods rich in calcium, as well as increase her weight-bearing activities.  She was given education on specific activities to promote bone health.  I also recommended dose of vitamin D [phone] IU daily as well  as calcium 1200 mg daily.  6. Cancer screening: We discussed that people who have had ovarian cancer can still get other cancers and that ovarian cancer survivors at are at a higher risk for getting some other types of cancer. We discussed the  importance of continuing appropriate screenings for other medical problems and other cancers. Specifically we discussed recommendations for annual mammograms, colon cancers, and skin cancers. Based on her age and history we discussed specific recommendations and included these in her comprehensive care plan.   7. Health maintenance and wellness promotion: Ms. Lantier was encouraged to consume 5-7 servings of fruits and vegetables per day. We reviewed the "Nutrition Rainbow" handout, as well as the handout "Take Control of Your Health and Reduce Your Cancer Risk" from the [place].  She was also encouraged to engage in moderate to vigorous exercise for 30 minutes per day most days of the week. We discussed the Care Program, which is designed for cancer survivors to help them become more physically fit after cancer treatments. She was instructed to limit her alcohol consumption and continue to abstain from tobacco use. I also recommended sun safety and getting an annual flu vaccine and her  covid-19 vaccinations.   8 Maintaining a healthy weight: We also discussed the importance of maintaining a healthy body weight as obesity is associated with cancer recurrence and can cause other illnesses including but not limited to diabetes, sleep apnea, and heart disease.   9. Support services/counseling: It is not uncommon for this period of the patient's cancer care trajectory to be one of many emotions and stressors.  We discussed an opportunity for her to participate in the next session of Cancer Transitions support group series designed for patients after they have completed treatment.   Ms. Folden was encouraged to take advantage of our many other support services programs, support groups, and/or counseling in coping with her new life as a cancer survivor after completing anti-cancer treatment.  She was offered support today through active listening and expressive supportive counseling.  She was given  information regarding our available services and encouraged to contact me with any questions or for help enrolling in any of our support group/programs.   10. Practical Issues: Cancer diagnosis may impact finances as well as work.  In addition, many people can have issues with insurance after diagnosis of cancer.  We discussed concerns regarding practical issues due to cancer diagnosis. I did recommend keeping health insurance throughout her lifetime as well as copies of her medical records. She was encouraged to follow-up with clinic if any ongoing concerns. I will assist her with her insurance claim issues and will draft letter to insurance company with requested information.    Dispo:   -Return to cancer center in 2-3 weeks for re-evaluation of duloxetine.  -She is welcome to return back to the [facility]orship [facility] at any time.   -Consider referral back to survivorship as a long-term survivor for continued surveillance after 5 years with gynecologic oncology.   A total of (65) minutes of face-to-face time was spent with this patient with greater than 50% of that time in counseling and care-coordination.  Beckey Rutter, DNP, AGNP-C [facility]   Note: [place], [place], [place] [phone]

## 2020-02-22 ENCOUNTER — Ambulatory Visit: Payer: Medicare Other

## 2020-02-23 ENCOUNTER — Encounter: Payer: Self-pay | Admitting: Family Medicine

## 2020-02-23 ENCOUNTER — Other Ambulatory Visit: Payer: Self-pay

## 2020-02-23 ENCOUNTER — Other Ambulatory Visit
Admission: RE | Admit: 2020-02-23 | Discharge: 2020-02-23 | Disposition: A | Discharge status: Home / Self Care | Payer: Medicare Other | Attending: Family Medicine | Admitting: Family Medicine

## 2020-02-23 ENCOUNTER — Other Ambulatory Visit: Payer: Self-pay | Admitting: Physician Assistant

## 2020-02-23 ENCOUNTER — Ambulatory Visit (INDEPENDENT_AMBULATORY_CARE_PROVIDER_SITE_OTHER): Payer: Medicare Other | Admitting: Family Medicine

## 2020-02-23 VITALS — BP 130/82 | HR 81 | Ht 63.0 in | Wt 173.0 lb

## 2020-02-23 DIAGNOSIS — K5792 Diverticulitis of intestine, part unspecified, without perforation or abscess without bleeding: Secondary | ICD-10-CM | POA: Insufficient documentation

## 2020-02-23 DIAGNOSIS — F329 Major depressive disorder, single episode, unspecified: Secondary | ICD-10-CM | POA: Diagnosis not present

## 2020-02-23 DIAGNOSIS — I10 Essential (primary) hypertension: Secondary | ICD-10-CM

## 2020-02-23 DIAGNOSIS — R350 Frequency of micturition: Secondary | ICD-10-CM

## 2020-02-23 LAB — CBC WITH DIFFERENTIAL/PLATELET
Abs Immature Granulocytes: 0 10*3/uL (ref 0.00–0.07)
Basophils Absolute: 0 10*3/uL (ref 0.0–0.1)
Basophils Relative: 0 %
Eosinophils Absolute: 0.2 10*3/uL (ref 0.0–0.5)
Eosinophils Relative: 5 %
HCT: 42.6 % (ref 36.0–46.0)
Hemoglobin: 14.4 g/dL (ref 12.0–15.0)
Immature Granulocytes: 0 %
Lymphocytes Relative: 39 %
Lymphs Abs: 1.3 10*3/uL (ref 0.7–4.0)
MCH: 32.8 pg (ref 26.0–34.0)
MCHC: 33.8 g/dL (ref 30.0–36.0)
MCV: 97 fL (ref 80.0–100.0)
Monocytes Absolute: 0.4 10*3/uL (ref 0.1–1.0)
Monocytes Relative: 13 %
Neutro Abs: 1.4 10*3/uL — ABNORMAL LOW (ref 1.7–7.7)
Neutrophils Relative %: 43 %
Platelets: 245 10*3/uL (ref 150–400)
RBC: 4.39 MIL/uL (ref 3.87–5.11)
RDW: 11.6 % (ref 11.5–15.5)
WBC: 3.3 10*3/uL — ABNORMAL LOW (ref 4.0–10.5)
nRBC: 0 % (ref 0.0–0.2)

## 2020-02-23 MED ORDER — LISINOPRIL 5 MG PO TABS: 5.0000 mg | ORAL_TABLET | Freq: Every day | ORAL | 1 refills | Status: DC

## 2020-02-23 MED ORDER — MIRABEGRON ER 50 MG PO TB24: 50.0000 mg | ORAL_TABLET | Freq: Every day | ORAL | 0 refills | Status: DC

## 2020-02-23 NOTE — Progress Notes (Signed)
Acute Office Visit  Subjective:    Patient ID: Carrie Roberson, female    DOB: 01-01-1946, 74 y.o.   MRN: [id number]  Chief Complaint  Patient presents with   Diverticulitis    Follow up from the ED. Just came off chemo 6-7 weeks ago     Hypertension This is a chronic problem. The current episode started more than 1 year ago. The problem has been gradually improving since onset. The problem is controlled. Pertinent negatives include no anxiety, blurred vision, chest pain, headaches, malaise/fatigue, neck pain, orthopnea, palpitations, peripheral edema, PND, shortness of breath or sweats. There are no associated agents to hypertension. There are no known risk factors for coronary artery disease. Past treatments include ACE inhibitors. The current treatment provides moderate improvement. There are no compliance problems.  There is no history of angina, kidney disease, CAD/MI, CVA, heart failure, left ventricular hypertrophy, PVD or retinopathy. There is no history of chronic renal disease, a hypertension causing med or renovascular disease.  GI Problem The primary symptoms include fatigue and diarrhea. Primary symptoms do not include fever, weight loss, abdominal pain, nausea, vomiting, melena, hematemesis, jaundice, hematochezia, dysuria, myalgias, arthralgias or rash.  The illness does not include chills, constipation or back pain.  Depression        This is a chronic problem.  The problem occurs intermittently.  Associated symptoms include decreased concentration, fatigue, irritable, decreased interest and sad.  Associated symptoms include no helplessness, no hopelessness, no restlessness, no myalgias, no headaches and no suicidal ideas.( Survivor guilt)     Exacerbated by: s/p ovarian cancer.  Compliance with treatment is variable.   Pertinent negatives include no anxiety.  Patient is in today for recheck for diverticulitis and elevated blood pressure reading.  Past Medical History:  Diagnosis  Date   Anxiety    Arthritis    Family history of breast cancer    Family history of leukemia    GERD (gastroesophageal reflux disease)    History of ischemic colitis 09/02/2017   Hx of colonic polyp    Hypertension    H/O-PT TAKEN OFF BY PCP AS OF 2019   IBS (irritable bowel syndrome)    Ovarian cancer ([place])    Ovarian cyst    Personal history of chemotherapy 2021   6 treatments   Vaginal atrophy     Past Surgical History:  Procedure Laterality Date   ABDOMINAL HYSTERECTOMY     BREAST BIOPSY Left    needle bx/clip-neg   CERVICAL BIOPSY  W/ LOOP ELECTRODE EXCISION     CHOLECYSTECTOMY     Colonoscopoy  2007, 2008, 2011   COLONOSCOPY WITH PROPOFOL N/A 05/05/2018   Procedure: COLONOSCOPY WITH PROPOFOL;  Surgeon: Lin Landsman, MD;  Location: [facility];  Service: Gastroenterology;  Laterality: N/A;   HYSTERECTOMY ABDOMINAL WITH SALPINGO-OOPHORECTOMY N/A 08/19/2019   Procedure: HYSTERECTOMY ABDOMINAL WITH SALPINGO-OOPHORECTOMY WITH PELVIC WASHINGS PERITONEAL BIOPSIES AND PARA AORTIC LYMPH NODE DISSECTION;  Surgeon: [name], MD;  Location: [facility] OR[facility];  Service: Gynecology;  Laterality: N/A;   LEEP     OMENTECTOMY N/A 08/19/2019   Procedure: OMENTECTOMY;  Surgeon: [name], MD;  Location: [facility];  Service: Gynecology;  Laterality: N/A;   OOPHORECTOMY     PORTA CATH INSERTION N/A 09/11/2019   Procedure: PORTA CATH INSERTION;  Surgeon: Algernon Huxley, MD;  Location: [facility];  Service: Cardiovascular;  Laterality: N/A;   TUBAL LIGATION  1999    Family History  Problem Relation Age of  Onset   Heart failure Mother    Atrial fibrillation Mother    Hypertension Mother    Diabetes Father    Heart attack Father    Breast cancer Maternal Aunt 21   Breast cancer Maternal Grandmother 85   Breast cancer Cousin        2 mat cousins   Hypertension Sister    Hypertension Sister    Arthritis Sister    Leukemia  Paternal Aunt    Leukemia Paternal Uncle    Brain cancer Paternal Aunt    Other Brother 15       Drowning accident   Liver cancer Maternal Uncle    Alcohol abuse Maternal Uncle    Prostate cancer Neg Hx    Kidney cancer Neg Hx    BRCA 1/2 Neg Hx     Social History   Socioeconomic History   Marital status: Single    Spouse name: Not on file   Number of children: 0   Years of education: Not on file   Highest education level: Associate degree: academic program  Occupational History   Occupation: Retired  Tobacco Use   Smoking status: Never Smoker   Smokeless tobacco: Never Used   Tobacco comment: smoking cessation materials not required  Vaping Use   Vaping Use: Never used  Substance and Sexual Activity   Alcohol use: Yes    Comment: rarely   Drug use: No   Sexual activity: Never    Birth control/protection: Post-menopausal  Other Topics Concern   Not on file  Social History Narrative   Not on file   Social Determinants of Health   Financial Resource Strain:    Difficulty of Paying Living Expenses:   Food Insecurity:    Worried About Charity fundraiser in the Last Year:    Arboriculturist in the Last Year:   Transportation Needs:    Film/video editor (Medical):    Lack of Transportation (Non-Medical):   Physical Activity:    Days of Exercise per Week:    Minutes of Exercise per Session:   Stress:    Feeling of Stress :   Social Connections:    Frequency of Communication with Friends and Family:    Frequency of Social Gatherings with Friends and Family:    Attends Religious Services:    Active Member of Clubs or Organizations:    Attends Music therapist:    Marital Status:   Intimate Partner Violence:    Fear of Current or Ex-Partner:    Emotionally Abused:    Physically Abused:    Sexually Abused:     Outpatient Medications Prior to Visit  Medication Sig Dispense Refill   acetaminophen  (TYLENOL) 500 MG tablet Take 1,000 mg by mouth every 6 (six) hours as needed (for pain.).     cetirizine (ZYRTEC) 10 MG tablet Take 10 mg by mouth daily as needed (allergies.).      ciprofloxacin (CIPRO) 500 MG tablet Take 1 tablet (500 mg total) by mouth 2 (two) times daily for 7 days. 14 tablet 0   DULoxetine (CYMBALTA) 60 MG capsule Take 1 capsule (60 mg total) by mouth daily. 30 capsule 0   fluconazole (DIFLUCAN) 150 MG tablet Take 1 tablet (150 mg total) by mouth daily. 1 tablet 0   LORazepam (ATIVAN) 0.5 MG tablet Take 1 tablet (0.5 mg total) by mouth every 6 (six) hours as needed (Nausea/vomiting or anxiety). 90 tablet 0   metroNIDAZOLE (  FLAGYL) 500 MG tablet Take 1 tablet (500 mg total) by mouth 3 (three) times daily for 10 days. 30 tablet 0   mirabegron ER (MYRBETRIQ) 50 MG TB24 tablet Take 1 tablet (50 mg total) by mouth daily. 28 tablet 0   amitriptyline (ELAVIL) 25 MG tablet Take 2 tablets (50 mg total) by mouth at bedtime. (Patient not taking: Reported on 02/01/2020) 90 tablet 0   bacitracin 500 UNIT/GM ointment Apply 1 application topically 2 (two) times daily. Apply to affected area twice a day as directed. (Patient not taking: Reported on 02/01/2020) 15 g 1   benzocaine-resorcinol (VAGISIL) 5-2 % vaginal cream Place 1 application vaginally daily as needed for itching or irritation. (Patient not taking: Reported on 02/01/2020)     diphenhydrAMINE (BENADRYL) 25 MG tablet Take 25 mg by mouth daily as needed for itching or allergies. (Patient not taking: Reported on 02/01/2020)     fluticasone (FLONASE) 50 MCG/ACT nasal spray Place 2 sprays into both nostrils daily. (Patient not taking: Reported on 02/01/2020) 16 g 0   guaiFENesin (MUCINEX) 600 MG 12 hr tablet Take 600 mg by mouth at bedtime as needed (congestion). (Patient not taking: Reported on 02/01/2020)     hydrocortisone cream 1 % Apply 1 application topically daily as needed for itching. (Patient not taking: Reported on  02/01/2020)     KLOR-CON M10 10 MEQ tablet TAKE 1 TABLET (10 MEQ TOTAL) BY MOUTH 2 (TWO) TIMES DAILY. (Patient not taking: Reported on 02/01/2020) 180 tablet 1   lidocaine-prilocaine (EMLA) cream Apply to affected area once (Patient not taking: Reported on 02/01/2020) 30 g 3   lisinopril (ZESTRIL) 5 MG tablet TAKE 1 TABLET BY MOUTH EVERY DAY (Patient not taking: Reported on 02/01/2020) 90 tablet 1   magnesium chloride (SLOW-MAG) 64 MG TBEC SR tablet Take 1 tablet (64 mg total) by mouth daily. (Patient not taking: Reported on 02/01/2020) 60 tablet 0   omeprazole (PRILOSEC) 20 MG capsule Take 20 mg by mouth daily as needed (acid reflux). (Patient not taking: Reported on 02/01/2020)     polyethylene glycol (MIRALAX / GLYCOLAX) 17 g packet Take 17 g by mouth 2 (two) times daily. (Patient not taking: Reported on 02/01/2020)     psyllium (METAMUCIL) 0.52 g capsule Take 0.52 g by mouth in the morning and at bedtime. (Patient not taking: Reported on 02/01/2020)     senna (SENOKOT) 8.6 MG tablet Take 1 tablet by mouth daily as needed.  (Patient not taking: Reported on 02/01/2020)     triamcinolone cream (KENALOG) 0.1 % Apply 1 application topically 2 (two) times daily. Apply to affected area twice a day as directed. (Patient not taking: Reported on 02/01/2020) 80 g 0   Facility-Administered Medications Prior to Visit  Medication Dose Route Frequency Provider Last Rate Last Admin   sodium chloride flush (NS) 0.9 % injection 10 mL  10 mL Intravenous PRN Sindy Guadeloupe, MD   10 mL at 01/05/20 0750    Allergies  Allergen Reactions   Citalopram Nausea And Vomiting   Ivp Dye [Iodinated Diagnostic Agents] Hives and Swelling    hives   Meloxicam     unknown   Adhesive [Tape] Rash   Amlodipine Rash   Ampicillin Rash    Did it involve swelling of the face/tongue/throat, SOB, or low BP? No Did it involve sudden or severe rash/hives, skin peeling, or any reaction on the inside of your mouth or nose?  Yes Did you need to seek medical attention at  a hospital or doctor's office? Yes When did it last happen? 10 + years If all above answers are NO, may proceed with cephalosporin use.   Cefoxitin Rash   Estradiol Rash   Estropipate Rash   Olmesartan Medoxomil-Hctz Rash   Sertraline Rash        Sulfa Antibiotics Rash    Review of Systems  Constitutional: Positive for fatigue. Negative for chills, fever, malaise/fatigue, unexpected weight change and weight loss.  HENT: Negative for congestion, ear discharge, ear pain, rhinorrhea, sinus pressure, sneezing and sore throat.   Eyes: Negative for blurred vision, photophobia, pain, discharge, redness and itching.  Respiratory: Negative for cough, shortness of breath, wheezing and stridor.   Cardiovascular: Negative for chest pain, palpitations, orthopnea and PND.  Gastrointestinal: Positive for diarrhea. Negative for abdominal pain, blood in stool, constipation, hematemesis, hematochezia, jaundice, melena, nausea and vomiting.  Endocrine: Negative for cold intolerance, heat intolerance, polydipsia, polyphagia and polyuria.  Genitourinary: Negative for dysuria, flank pain, frequency, hematuria, menstrual problem, pelvic pain, urgency, vaginal bleeding and vaginal discharge.  Musculoskeletal: Negative for arthralgias, back pain, myalgias and neck pain.  Skin: Negative for rash.  Allergic/Immunologic: Negative for environmental allergies and food allergies.  Neurological: Negative for dizziness, weakness, light-headedness, numbness and headaches.  Hematological: Negative for adenopathy. Does not bruise/bleed easily.  Psychiatric/Behavioral: Positive for decreased concentration and depression. Negative for dysphoric mood and suicidal ideas. The patient is not nervous/anxious.        Objective:    Physical Exam Vitals and nursing note reviewed.  Constitutional:      General: She is irritable. She is not in acute distress.     Appearance: She is not diaphoretic.  HENT:     Head: Normocephalic and atraumatic.     Right Ear: Tympanic membrane, ear canal and external ear normal.     Left Ear: Tympanic membrane, ear canal and external ear normal.     Nose: Nose normal.  Eyes:     General:        Right eye: No discharge.        Left eye: No discharge.     Conjunctiva/sclera: Conjunctivae normal.     Pupils: Pupils are equal, round, and reactive to light.  Neck:     Thyroid: No thyromegaly.     Vascular: No JVD.  Cardiovascular:     Rate and Rhythm: Normal rate and regular rhythm.     Heart sounds: Normal heart sounds. No murmur heard.  No friction rub. No gallop.   Pulmonary:     Effort: Pulmonary effort is normal.     Breath sounds: Normal breath sounds.  Abdominal:     General: Bowel sounds are normal.     Palpations: Abdomen is soft. There is no mass.     Tenderness: There is no abdominal tenderness. There is no guarding.  Musculoskeletal:        General: Normal range of motion.     Cervical back: Normal range of motion and neck supple.  Lymphadenopathy:     Cervical: No cervical adenopathy.  Skin:    General: Skin is warm and dry.  Neurological:     Mental Status: She is alert.     Deep Tendon Reflexes: Reflexes are normal and symmetric.     BP 130/82    Pulse 81    Ht 5' 3"  (1.6 m)    Wt 173 lb (78.5 kg)    SpO2 98%    BMI 30.65 kg/m  Wt Readings  from Last 3 Encounters:  02/23/20 173 lb (78.5 kg)  02/16/20 174 lb 3.2 oz (79 kg)  02/01/20 174 lb 3.2 oz (79 kg)    There are no preventive care reminders to display for this patient.  There are no preventive care reminders to display for this patient.   Lab Results  Component Value Date   TSH 0.856 09/02/2017   Lab Results  Component Value Date   WBC 2.9 (L) 02/16/2020   HGB 14.7 02/16/2020   HCT 42.8 02/16/2020   MCV 98.4 02/16/2020   PLT 224 02/16/2020   Lab Results  Component Value Date   NA 137 02/16/2020   K 3.7 02/16/2020    CO2 21 (L) 02/16/2020   GLUCOSE 130 (H) 02/16/2020   BUN 6 (L) 02/16/2020   CREATININE 0.63 02/16/2020   BILITOT 0.7 02/16/2020   ALKPHOS 102 02/16/2020   AST 21 02/16/2020   ALT 23 02/16/2020   PROT 7.1 02/16/2020   ALBUMIN 4.2 02/16/2020   CALCIUM 10.6 (H) 02/16/2020   ANIONGAP 11 02/16/2020   Lab Results  Component Value Date   CHOL 188 09/02/2017   Lab Results  Component Value Date   HDL 53 09/02/2017   Lab Results  Component Value Date   LDLCALC 109 (H) 09/02/2017   Lab Results  Component Value Date   TRIG 131 09/02/2017   Lab Results  Component Value Date   CHOLHDL 3.5 09/02/2017   No results found for: HGBA1C     Assessment & Plan:   Problem List Items Addressed This Visit    None       No orders of the defined types were placed in this encounter.    1. Diverticulitis New onset.  Patient was recently evaluated in ER setting for suspected diverticulitis is noted on CT scan.  Patient is currently on dual antibiotic therapy and is gradually improving.  CBC was noted to be decreased and we will repeat a CBC to determine if this is stabilizing on returning to normal. - CBC with Differential/Platelet  2. Essential hypertension Chronic.  Reemergence.  Stable.  Patient used to be on lisinopril and that was discontinued during her chemotherapy and other circumstances.  Blood pressure was noted to be elevated the last oncology and patient was suggested to return to her previous medication which was lisinopril 5 mg once a day.  We will refill fill this and patient will continue and we will recheck in 6 months. - lisinopril (ZESTRIL) 5 MG tablet; Take 1 tablet (5 mg total) by mouth daily.  Dispense: 90 tablet; Refill: 1  3. Reactive depression New onset.  Gradually improving.  Relatively stabilizing.  PHQ is 8 with gad score of 1.  Patient is currently on Cymbalta for depression and perhaps pain management.  Patient is also been given some Ativan until the  Cymbalta is started to become effective.  I concur with this and would suggest that we continue on an SNRI at present dose.

## 2020-02-24 ENCOUNTER — Encounter: Payer: Self-pay | Admitting: *Deleted

## 2020-02-24 ENCOUNTER — Encounter: Payer: Self-pay | Admitting: Nurse Practitioner

## 2020-02-24 DIAGNOSIS — G620 Drug-induced polyneuropathy: Secondary | ICD-10-CM | POA: Insufficient documentation

## 2020-03-03 ENCOUNTER — Inpatient Hospital Stay: Payer: Medicare Other | Attending: Nurse Practitioner | Admitting: Nurse Practitioner

## 2020-03-03 ENCOUNTER — Ambulatory Visit: Payer: Self-pay | Admitting: Urology

## 2020-03-03 ENCOUNTER — Ambulatory Visit: Payer: Medicare Other | Admitting: Oncology

## 2020-03-03 ENCOUNTER — Other Ambulatory Visit: Payer: Medicare Other

## 2020-03-03 ENCOUNTER — Other Ambulatory Visit: Payer: Self-pay

## 2020-03-03 DIAGNOSIS — T451X5A Adverse effect of antineoplastic and immunosuppressive drugs, initial encounter: Secondary | ICD-10-CM

## 2020-03-03 DIAGNOSIS — F418 Other specified anxiety disorders: Secondary | ICD-10-CM | POA: Diagnosis not present

## 2020-03-03 DIAGNOSIS — Z9221 Personal history of antineoplastic chemotherapy: Secondary | ICD-10-CM | POA: Diagnosis not present

## 2020-03-03 DIAGNOSIS — C569 Malignant neoplasm of unspecified ovary: Secondary | ICD-10-CM | POA: Insufficient documentation

## 2020-03-03 DIAGNOSIS — Z79899 Other long term (current) drug therapy: Secondary | ICD-10-CM | POA: Insufficient documentation

## 2020-03-03 DIAGNOSIS — G620 Drug-induced polyneuropathy: Secondary | ICD-10-CM

## 2020-03-03 DIAGNOSIS — C561 Malignant neoplasm of right ovary: Secondary | ICD-10-CM | POA: Diagnosis not present

## 2020-03-03 DIAGNOSIS — Z95828 Presence of other vascular implants and grafts: Secondary | ICD-10-CM | POA: Diagnosis not present

## 2020-03-03 DIAGNOSIS — C801 Malignant (primary) neoplasm, unspecified: Secondary | ICD-10-CM

## 2020-03-08 ENCOUNTER — Other Ambulatory Visit: Payer: Self-pay

## 2020-03-08 ENCOUNTER — Ambulatory Visit: Payer: Medicare Other | Admitting: Urology

## 2020-03-08 ENCOUNTER — Encounter: Payer: Self-pay | Admitting: Oncology

## 2020-03-08 ENCOUNTER — Encounter: Payer: Self-pay | Admitting: Physician Assistant

## 2020-03-08 ENCOUNTER — Inpatient Hospital Stay (HOSPITAL_BASED_OUTPATIENT_CLINIC_OR_DEPARTMENT_OTHER): Payer: Medicare Other | Admitting: Oncology

## 2020-03-08 ENCOUNTER — Encounter: Payer: Self-pay | Admitting: Nurse Practitioner

## 2020-03-08 ENCOUNTER — Inpatient Hospital Stay: Payer: Medicare Other

## 2020-03-08 ENCOUNTER — Ambulatory Visit (INDEPENDENT_AMBULATORY_CARE_PROVIDER_SITE_OTHER): Payer: Medicare Other | Admitting: Physician Assistant

## 2020-03-08 VITALS — BP 128/61 | HR 82 | Temp 97.6°F | Resp 18 | Wt 171.7 lb

## 2020-03-08 VITALS — BP 153/73 | HR 101 | Ht 63.0 in | Wt 173.0 lb

## 2020-03-08 DIAGNOSIS — T451X5A Adverse effect of antineoplastic and immunosuppressive drugs, initial encounter: Secondary | ICD-10-CM

## 2020-03-08 DIAGNOSIS — C569 Malignant neoplasm of unspecified ovary: Secondary | ICD-10-CM | POA: Diagnosis not present

## 2020-03-08 DIAGNOSIS — Z8543 Personal history of malignant neoplasm of ovary: Secondary | ICD-10-CM | POA: Diagnosis not present

## 2020-03-08 DIAGNOSIS — G620 Drug-induced polyneuropathy: Secondary | ICD-10-CM

## 2020-03-08 DIAGNOSIS — Z08 Encounter for follow-up examination after completed treatment for malignant neoplasm: Secondary | ICD-10-CM

## 2020-03-08 DIAGNOSIS — Z9221 Personal history of antineoplastic chemotherapy: Secondary | ICD-10-CM | POA: Diagnosis not present

## 2020-03-08 DIAGNOSIS — R350 Frequency of micturition: Secondary | ICD-10-CM

## 2020-03-08 DIAGNOSIS — Z79899 Other long term (current) drug therapy: Secondary | ICD-10-CM | POA: Diagnosis not present

## 2020-03-08 DIAGNOSIS — C561 Malignant neoplasm of right ovary: Secondary | ICD-10-CM

## 2020-03-08 LAB — CBC WITH DIFFERENTIAL/PLATELET
Abs Immature Granulocytes: 0.01 10*3/uL (ref 0.00–0.07)
Basophils Absolute: 0 10*3/uL (ref 0.0–0.1)
Basophils Relative: 0 %
Eosinophils Absolute: 0.1 10*3/uL (ref 0.0–0.5)
Eosinophils Relative: 2 %
HCT: 40.9 % (ref 36.0–46.0)
Hemoglobin: 14.6 g/dL (ref 12.0–15.0)
Immature Granulocytes: 0 %
Lymphocytes Relative: 40 %
Lymphs Abs: 1.5 10*3/uL (ref 0.7–4.0)
MCH: 33.1 pg (ref 26.0–34.0)
MCHC: 35.7 g/dL (ref 30.0–36.0)
MCV: 92.7 fL (ref 80.0–100.0)
Monocytes Absolute: 0.4 10*3/uL (ref 0.1–1.0)
Monocytes Relative: 9 %
Neutro Abs: 1.9 10*3/uL (ref 1.7–7.7)
Neutrophils Relative %: 49 %
Platelets: 263 10*3/uL (ref 150–400)
RBC: 4.41 MIL/uL (ref 3.87–5.11)
RDW: 11 % — ABNORMAL LOW (ref 11.5–15.5)
WBC: 3.8 10*3/uL — ABNORMAL LOW (ref 4.0–10.5)
nRBC: 0 % (ref 0.0–0.2)

## 2020-03-08 LAB — COMPREHENSIVE METABOLIC PANEL
ALT: 21 U/L (ref 0–44)
AST: 18 U/L (ref 15–41)
Albumin: 4.1 g/dL (ref 3.5–5.0)
Alkaline Phosphatase: 111 U/L (ref 38–126)
Anion gap: 6 (ref 5–15)
BUN: 11 mg/dL (ref 8–23)
CO2: 26 mmol/L (ref 22–32)
Calcium: 10.1 mg/dL (ref 8.9–10.3)
Chloride: 101 mmol/L (ref 98–111)
Creatinine, Ser: 0.65 mg/dL (ref 0.44–1.00)
GFR calc Af Amer: >60 mL/min (ref >60)
GFR calc non Af Amer: >60 mL/min (ref >60)
Glucose, Bld: 123 mg/dL — ABNORMAL HIGH (ref 70–99)
Potassium: 3.8 mmol/L (ref 3.5–5.1)
Sodium: 133 mmol/L — ABNORMAL LOW (ref 135–145)
Total Bilirubin: 0.6 mg/dL (ref 0.3–1.2)
Total Protein: 6.8 g/dL (ref 6.5–8.1)

## 2020-03-08 LAB — URINALYSIS, COMPLETE
Bilirubin, UA: NEGATIVE
Glucose, UA: NEGATIVE
Ketones, UA: NEGATIVE
Leukocytes,UA: NEGATIVE
Nitrite, UA: NEGATIVE
Specific Gravity, UA: 1.02 (ref 1.005–1.030)
Urobilinogen, Ur: 0.2 mg/dL (ref 0.2–1.0)
pH, UA: 7.5 (ref 5.0–7.5)

## 2020-03-08 LAB — MICROSCOPIC EXAMINATION

## 2020-03-08 LAB — BLADDER SCAN AMB NON-IMAGING

## 2020-03-08 MED ORDER — MIRABEGRON ER 50 MG PO TB24: 50.0000 mg | ORAL_TABLET | Freq: Every day | ORAL | 3 refills | Status: DC

## 2020-03-08 MED ORDER — SODIUM CHLORIDE 0.9% FLUSH
10.0000 mL | INTRAVENOUS | Status: DC | PRN
  Administered 2020-03-08: 10 mL via INTRAVENOUS
  Filled 2020-03-08: qty 10

## 2020-03-08 MED ORDER — HEPARIN SOD (PORK) LOCK FLUSH 100 UNIT/ML IV SOLN
500.0000 [IU] | Freq: Once | INTRAVENOUS | Status: AC
  Administered 2020-03-08: 500 [IU] via INTRAVENOUS
  Filled 2020-03-08: qty 5

## 2020-03-08 NOTE — Progress Notes (Signed)
03/08/2020 1:35 PM   Carrie Roberson 1946/02/01 [id number]  CC: Chief Complaint  Patient presents with  . Urinary Frequency    35monthfollow up    HPI: MDynastee Roberson a 74y.o. 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  Today she reports improvement in her urinary frequency and resolution of urge incontinence on Myrbetriq. She continues to report her frequency occurs predominantly at night, and she is now going closer to her baseline of every 2-2.5 hours compared with every 30 minutes prior to initiation of Myrbetriq.  She has a history of IBS with diarrhea and constipation and is scheduled for GI follow-up next month. She reports drinking approximately 6-8 glasses of water daily until around 8pm with one additional glass with meds at 9pm. She denies BLE edema. No known history of OSA or snoring. Not on diuretics.  In-office UA today positive for trace-lysed blood and trace protein; urine microscopy with 6-10 WBCs/HPF. PVR 378m  PMH: Past Medical History:  Diagnosis Date  . Anxiety   . Arthritis   . Family history of breast cancer   . Family history of leukemia   . GERD (gastroesophageal reflux disease)   . History of ischemic colitis 09/02/2017  . Hx of colonic polyp   . Hypertension    H/O-PT TAKEN OFF BY PCP AS OF 2019  . IBS (irritable bowel syndrome)   . Ovarian cancer ([facility]  . Ovarian cyst   . Personal history of chemotherapy 2021   6 treatments  . Vaginal atrophy     Surgical History: Past Surgical History:  Procedure Laterality Date  . ABDOMINAL HYSTERECTOMY    . BREAST BIOPSY Left    needle bx/clip-neg  . CERVICAL BIOPSY  W/ LOOP ELECTRODE EXCISION    . CHOLECYSTECTOMY    . Colonoscopoy  2007, 2008, 2011  . COLONOSCOPY WITH PROPOFOL N/A 05/05/2018   Procedure: COLONOSCOPY WITH  PROPOFOL;  Surgeon: VaLin LandsmanMD;  [facility];  Service: Gastroenterology;  Laterality: N/A;  . HYSTERECTOMY ABDOMINAL WITH SALPINGO-OOPHORECTOMY N/A 08/19/2019   Procedure: HYSTERECTOMY ABDOMINAL WITH SALPINGO-OOPHORECTOMY WITH PELVIC WASHINGS PERITONEAL BIOPSIES AND PARA AORTIC LYMPH NODE DISSECTION;  Surgeon: StMalachy MoodMD;  Location: [facility];  Service: Gynecology;  Laterality: N/A;  . LEEP    . OMENTECTOMY N/A 08/19/2019   Procedure: OMENTECTOMY;  Surgeon: StMalachy MoodMD;  Location: [facility];  Service: Gynecology;  Laterality: N/A;  . OOPHORECTOMY    . PORTA CATH INSERTION N/A 09/11/2019   Procedure: PORTA CATH INSERTION;  Surgeon: DeAlgernon HuxleyMD;  [facility];  Service: Cardiovascular;  Laterality: N/A;  . TUBAL LIGATION  1999    Home Medications:  Allergies as of 03/08/2020      Reactions   Citalopram Nausea And Vomiting   Ivp Dye [iodinated Diagnostic Agents] Hives, Swelling   hives   Meloxicam    unknown   Adhesive [tape] Rash   Amlodipine Rash   Ampicillin Rash   Did it involve swelling of the face/tongue/throat, SOB, or low BP? No Did it involve sudden or severe rash/hives, skin peeling, or any reaction on the inside of your mouth or nose? Yes Did you need to seek medical attention at a hospital or doctor's office? Yes When did it last happen? 10 + years If all above answers are "NO", may  proceed with cephalosporin use.   Cefoxitin Rash   Estradiol Rash   Estropipate Rash   Olmesartan Medoxomil-hctz Rash   Sertraline Rash      Sulfa Antibiotics Rash      Medication List       Accurate as of March 08, 2020  1:35 PM. If you have any questions, ask your nurse or doctor.        STOP taking these medications   fluconazole 150 MG tablet Commonly known as: Diflucan Stopped by: Debroah Loop, PA-C     TAKE these medications   acetaminophen 500 MG tablet Commonly known as: TYLENOL Take 1,000  mg by mouth every 6 (six) hours as needed (for pain.).   cetirizine 10 MG tablet Commonly known as: ZYRTEC Take 10 mg by mouth daily as needed (allergies.).   DULoxetine 60 MG capsule Commonly known as: CYMBALTA Take 1 capsule (60 mg total) by mouth daily.   lisinopril 5 MG tablet Commonly known as: ZESTRIL Take 1 tablet (5 mg total) by mouth daily.   LORazepam 0.5 MG tablet Commonly known as: Ativan Take 1 tablet (0.5 mg total) by mouth every 6 (six) hours as needed (Nausea/vomiting or anxiety).   mirabegron ER 50 MG Tb24 tablet Commonly known as: MYRBETRIQ Take 1 tablet (50 mg total) by mouth daily.       Allergies:  Allergies  Allergen Reactions  . Citalopram Nausea And Vomiting  . Ivp Dye [Iodinated Diagnostic Agents] Hives and Swelling    hives  . Meloxicam     unknown  . Adhesive [Tape] Rash  . Amlodipine Rash  . Ampicillin Rash    Did it involve swelling of the face/tongue/throat, SOB, or low BP? No Did it involve sudden or severe rash/hives, skin peeling, or any reaction on the inside of your mouth or nose? Yes Did you need to seek medical attention at a hospital or doctor's office? Yes When did it last happen? 10 + years If all above answers are "NO", may proceed with cephalosporin use.  . Cefoxitin Rash  . Estradiol Rash  . Estropipate Rash  . Olmesartan Medoxomil-Hctz Rash  . Sertraline Rash       . Sulfa Antibiotics Rash    Family History: Family History  Problem Relation Age of Onset  . Heart failure Mother   . Atrial fibrillation Mother   . Hypertension Mother   . Diabetes Father   . Heart attack Father   . Breast cancer Maternal Aunt 70  . Breast cancer Maternal Grandmother 80  . Breast cancer Cousin        2 mat cousins  . Hypertension Sister   . Hypertension Sister   . Arthritis Sister   . Leukemia Paternal Aunt   . Leukemia Paternal Uncle   . Brain cancer Paternal Aunt   . Other Brother 13       Drowning accident  . Liver  cancer Maternal Uncle   . Alcohol abuse Maternal Uncle   . Prostate cancer Neg Hx   . Kidney cancer Neg Hx   . BRCA 1/2 Neg Hx     Social History:   reports that she has never smoked. She has never used smokeless tobacco. She reports current alcohol use. She reports that she does not use drugs.  Physical Exam: BP (!) 153/73   Pulse (!) 101   Ht 5' 3"  (1.6 m)   Wt 173 lb (78.5 kg)   BMI 30.65 kg/m   Constitutional:  Alert  and oriented, no acute distress, nontoxic appearing HEENT: [place], AT Cardiovascular: No clubbing, cyanosis, or edema Respiratory: Normal respiratory effort, no increased work of breathing Skin: No rashes, bruises or suspicious lesions Neurologic: Grossly intact, no focal deficits, moving all 4 extremities Psychiatric: Normal mood and affect  Laboratory Data: Results for orders placed or performed in visit on 03/08/20  Microscopic Examination   Urine  Result Value Ref Range   WBC, UA 6-10 (A) 0 - 5 /hpf   RBC 0-2 0 - 2 /hpf   Epithelial Cells (non renal) 0-10 0 - 10 /hpf   Bacteria, UA Few None seen/Few  Urinalysis, Complete  Result Value Ref Range   Specific Gravity, UA 1.020 1.005 - 1.030   pH, UA 7.5 5.0 - 7.5   Color, UA Yellow Yellow   Appearance Ur Cloudy (A) Clear   Leukocytes,UA Negative Negative   Protein,UA Trace (A) Negative/Trace   Glucose, UA Negative Negative   Ketones, UA Negative Negative   RBC, UA Trace (A) Negative   Bilirubin, UA Negative Negative   Urobilinogen, Ur 0.2 0.2 - 1.0 mg/dL   Nitrite, UA Negative Negative   Microscopic Examination See below:   BLADDER SCAN AMB NON-IMAGING  Result Value Ref Range   Scan Result 32m    Assessment & Plan:   1. Urinary frequency Symptoms significantly improved on Myrbetriq 50 mg daily.  We will continue this with symptom recheck in 2 months.  PVR WNL, UA reassuring for infection.  If symptom improvement is inadequate at that time, may consider alternative OAB therapies including PTNS or  intravesical Botox.  I do not recommend anticholinergic use given her age and history of intermittent constipation.  We discussed that her frequency is likely multifactorial with contributors including fluid intake late in the day and IBS with diarrhea and constipation.  I counseled her to stop drinking fluids after dinnertime and reduce bedtime water intake with meds to sips only unless indicated otherwise by her medications.  I counseled her to keep her scheduled follow-up appointment with GI.  She expressed understanding. - BLADDER SCAN AMB NON-IMAGING - Urinalysis, Complete - mirabegron ER (MYRBETRIQ) 50 MG TB24 tablet; Take 1 tablet (50 mg total) by mouth daily.  Dispense: 30 tablet; Refill: 3   No follow-ups on file.  SDebroah Loop PA-C  [facility] [address] [place] [phone]

## 2020-03-08 NOTE — Patient Instructions (Signed)
1. Continue Myrbetriq. 2. Stop drinking fluids after dinnertime and limit your bedtime glass of water to sips so you can take your medication. 3. Follow up with GI as scheduled.

## 2020-03-09 LAB — CA 125: Cancer Antigen (CA) 125: 6.6 U/mL (ref 0.0–38.1)

## 2020-03-11 ENCOUNTER — Other Ambulatory Visit: Payer: Self-pay | Admitting: Nurse Practitioner

## 2020-03-12 NOTE — Progress Notes (Signed)
Hematology/Oncology Consult note [facility]  Telephone:([phone] [phone]  Patient Care Team: Juline Patch, MD as PCP - General (Family Medicine) Lin Landsman, MD as Consulting Physician (Gastroenterology) Clent Jacks, RN as Oncology Nurse Navigator Mellody Drown, MD as Referring Physician (Obstetrics) Sindy Guadeloupe, MD as Consulting Physician (Oncology) [name], MD as Consulting Physician (Obstetrics and Gynecology)   Name of the patient: Carrie Roberson  [id number]  Feb 11, 1946   Date of visit: 03/12/20  Diagnosis- high-grade serous carcinoma of the ovary FIGO stage IC2 [id number]  Chief complaint/ Reason for visit- routine f/u of ovarian cancer  Heme/Onc history: Patient is a 74 year old female who presented with symptoms of pelvic pressure and recurrent urinary tract infection. This was followed by an MR pelvis with and without contrast in December 2020 which showed a large cystic mass arising from the right ovary measuring 1.8 cm. This was followed by TAH/BSO with peritoneal biopsies with pelvic/aortic lymph node dissection pelvic washings and omentectomy with Dr. Fransisca Connors and Dr. Georgianne Fick on 08/19/2019. 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 Angiolymphatic invasion is present. Bilateral fallopian tubes were negative for atypia and malignancy. Peritoneal stone excision negative for malignancy. Uterus with cervix negative for atypia and malignancy. Bladder, right gutter negative for malignancy. 5 lymph nodes were sampled and were negative for malignancy. Pelvic washings were also negative for malignancy. Pathologic staging FIGO1 C2  Patient was seen by Dr. Fransisca Connors postoperatively and recommendation was to proceed with adjuvant chemotherapy carbotaxol for 6 cycles.  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  The CancerNext gene panel offered by [employer] includes sequencing and rearrangement analysis for the following 34 genes:   APC, ATM, BARD1, BMPR1A, BRCA1, BRCA2, BRIP1, CDH1, CDK4, CDKN2A, CHEK2, DICER1, HOXB13, EPCAM, GREM1, MLH1, MRE11A, MSH2, MSH6, MUTYH, NBN, NF1, PALB2, PMS2, POLD1, POLE, PTEN, RAD50, RAD51C, RAD51D, SMAD4, SMARCA4, STK11, and TP53.  The report date is 10/29/2019.  TumorHRD testing was negative.       Interval history- Patient reports having developed neuropathy in her hands and feet after completing chemotherapy.  She also reports having feelings of survival guilt and feelings of anxiety for which she was started on Cymbalta 60 mg daily.  Reports that her anxiety is doing better after starting these medications.  She also had a recent CT abdomen which did not show any evidence of malignancy.  It showed evidence of possible diverticulitis for which she was given a course of antibiotics.  She sees urology for symptoms of urinary frequency and mixed incontinence for which she is on Myrbetriq.  ECOG PS- 1 Pain scale- 0   Review of systems- Review of Systems  Constitutional: Positive for malaise/fatigue. Negative for chills, fever and weight loss.  HENT: Negative for congestion, ear discharge and nosebleeds.   Eyes: Negative for blurred vision.  Respiratory: Negative for cough, hemoptysis, sputum production, shortness of breath and wheezing.   Cardiovascular: Negative for chest pain, palpitations, orthopnea and claudication.  Gastrointestinal: Negative for abdominal pain, blood in stool, constipation, diarrhea, heartburn, melena, nausea and vomiting.  Genitourinary: Negative for dysuria, flank pain, frequency, hematuria and urgency.  Musculoskeletal: Negative for back pain, joint pain and myalgias.  Skin: Negative for rash.  Neurological: Negative for dizziness, tingling, focal weakness, seizures, weakness and headaches.  Endo/Heme/Allergies: Does not bruise/bleed easily.  Psychiatric/Behavioral: Negative for  depression and suicidal ideas. The patient is nervous/anxious. The patient does not have insomnia.  Allergies  Allergen Reactions  . Citalopram Nausea And Vomiting  . Ivp Dye [Iodinated Diagnostic Agents] Hives and Swelling    hives  . Meloxicam     unknown  . Adhesive [Tape] Rash  . Amlodipine Rash  . Ampicillin Rash    Did it involve swelling of the face/tongue/throat, SOB, or low BP? No Did it involve sudden or severe rash/hives, skin peeling, or any reaction on the inside of your mouth or nose? Yes Did you need to seek medical attention at a hospital or doctor's office? Yes When did it last happen? 10 + years If all above answers are "NO", may proceed with cephalosporin use.  . Cefoxitin Rash  . Estradiol Rash  . Estropipate Rash  . Olmesartan Medoxomil-Hctz Rash  . Sertraline Rash       . Sulfa Antibiotics Rash     Past Medical History:  Diagnosis Date  . Anxiety   . Arthritis   . Family history of breast cancer   . Family history of leukemia   . GERD (gastroesophageal reflux disease)   . History of ischemic colitis 09/02/2017  . Hx of colonic polyp   . Hypertension    H/O-PT TAKEN OFF BY PCP AS OF 2019  . IBS (irritable bowel syndrome)   . Ovarian cancer ([place])   . Ovarian cyst   . Personal history of chemotherapy 2021   6 treatments  . Vaginal atrophy      Past Surgical History:  Procedure Laterality Date  . ABDOMINAL HYSTERECTOMY    . BREAST BIOPSY Left    needle bx/clip-neg  . CERVICAL BIOPSY  W/ LOOP ELECTRODE EXCISION    . CHOLECYSTECTOMY    . Colonoscopoy  2007, 2008, 2011  . COLONOSCOPY WITH PROPOFOL N/A 05/05/2018   Procedure: COLONOSCOPY WITH PROPOFOL;  Surgeon: Lin Landsman, MD;  Location: [facility];  Service: Gastroenterology;  Laterality: N/A;  . HYSTERECTOMY ABDOMINAL WITH SALPINGO-OOPHORECTOMY N/A 08/19/2019   Procedure: HYSTERECTOMY ABDOMINAL WITH SALPINGO-OOPHORECTOMY WITH PELVIC WASHINGS PERITONEAL BIOPSIES AND  PARA AORTIC LYMPH NODE DISSECTION;  Surgeon: [name], MD;  Location: [facility] ORS;  Service: Gynecology;  Laterality: N/A;  . LEEP    . OMENTECTOMY N/A 08/19/2019   Procedure: OMENTECTOMY;  Surgeon: [name], MD;  Location: [facility] ORS;  Service: Gynecology;  Laterality: N/A;  . OOPHORECTOMY    . PORTA CATH INSERTION N/A 09/11/2019   Procedure: PORTA CATH INSERTION;  Surgeon: Algernon Huxley, MD;  Location: [place] [facility];  Service: Cardiovascular;  Laterality: N/A;  . TUBAL LIGATION  1999    Social History   Socioeconomic History  . Marital status: Single    Spouse name: Not on file  . Number of children: 0  . Years of education: Not on file  . Highest education level: Associate degree: academic program  Occupational History  . Occupation: Retired  Tobacco Use  . Smoking status: Never Smoker  . Smokeless tobacco: Never Used  . Tobacco comment: smoking cessation materials not required  Vaping Use  . Vaping Use: Never used  Substance and Sexual Activity  . Alcohol use: Yes    Comment: rarely  . Drug use: No  . Sexual activity: Never    Birth control/protection: Post-menopausal  Other Topics Concern  . Not on file  Social History Narrative  . Not on file   Social Determinants of Health   Financial Resource Strain:   . Difficulty of Paying Living Expenses: Not on file  Food Insecurity:   .  Worried About Charity fundraiser in the Last Year: Not on file  . Ran Out of Food in the Last Year: Not on file  Transportation Needs:   . Lack of Transportation (Medical): Not on file  . Lack of Transportation (Non-Medical): Not on file  Physical Activity:   . Days of Exercise per Week: Not on file  . Minutes of Exercise per Session: Not on file  Stress:   . Feeling of Stress : Not on file  Social Connections:   . Frequency of Communication with Friends and Family: Not on file  . Frequency of Social Gatherings with Friends and Family: Not on file  . Attends  Religious Services: Not on file  . Active Member of Clubs or Organizations: Not on file  . Attends Archivist Meetings: Not on file  . Marital Status: Not on file  Intimate Partner Violence:   . Fear of Current or Ex-Partner: Not on file  . Emotionally Abused: Not on file  . Physically Abused: Not on file  . Sexually Abused: Not on file    Family History  Problem Relation Age of Onset  . Heart failure Mother   . Atrial fibrillation Mother   . Hypertension Mother   . Diabetes Father   . Heart attack Father   . Breast cancer Maternal Aunt 70  . Breast cancer Maternal Grandmother 80  . Breast cancer Cousin        2 mat cousins  . Hypertension Sister   . Hypertension Sister   . Arthritis Sister   . Leukemia Paternal Aunt   . Leukemia Paternal Uncle   . Brain cancer Paternal Aunt   . Other Brother 13       Drowning accident  . Liver cancer Maternal Uncle   . Alcohol abuse Maternal Uncle   . Prostate cancer Neg Hx   . Kidney cancer Neg Hx   . BRCA 1/2 Neg Hx      Current Outpatient Medications:  .  acetaminophen (TYLENOL) 500 MG tablet, Take 1,000 mg by mouth every 6 (six) hours as needed (for pain.)., Disp: , Rfl:  .  cetirizine (ZYRTEC) 10 MG tablet, Take 10 mg by mouth daily as needed (allergies.). , Disp: , Rfl:  .  lisinopril (ZESTRIL) 5 MG tablet, Take 1 tablet (5 mg total) by mouth daily., Disp: 90 tablet, Rfl: 1 .  LORazepam (ATIVAN) 0.5 MG tablet, Take 1 tablet (0.5 mg total) by mouth every 6 (six) hours as needed (Nausea/vomiting or anxiety)., Disp: 90 tablet, Rfl: 0 .  mirabegron ER (MYRBETRIQ) 50 MG TB24 tablet, Take 1 tablet (50 mg total) by mouth daily., Disp: 30 tablet, Rfl: 3 .  DULoxetine (CYMBALTA) 60 MG capsule, TAKE 1 CAPSULE BY MOUTH EVERY DAY, Disp: 90 capsule, Rfl: 1 No current facility-administered medications for this visit.  Facility-Administered Medications Ordered in Other Visits:  .  sodium chloride flush (NS) 0.9 % injection 10 mL, 10  mL, Intravenous, PRN, Sindy Guadeloupe, MD, 10 mL at 01/05/20 0750  Physical exam:  Vitals:   03/08/20 1406  BP: 128/61  Pulse: 82  Resp: 18  Temp: 97.6 F (36.4 C)  TempSrc: Tympanic  SpO2: 99%  Weight: 171 lb 11.2 oz (77.9 kg)   Physical Exam Constitutional:      General: She is not in acute distress. Cardiovascular:     Rate and Rhythm: Normal rate and regular rhythm.     Heart sounds: Normal heart sounds.  Pulmonary:  Effort: Pulmonary effort is normal.     Breath sounds: Normal breath sounds.  Abdominal:     General: Bowel sounds are normal.     Palpations: Abdomen is soft.  Skin:    General: Skin is warm and dry.  Neurological:     Mental Status: She is alert and oriented to person, place, and time.      CMP Latest Ref Rng & Units 03/08/2020  Glucose 70 - 99 mg/dL 123(H)  BUN 8 - 23 mg/dL 11  Creatinine 0.44 - 1.00 mg/dL 0.65  Sodium 135 - 145 mmol/L 133(L)  Potassium 3.5 - 5.1 mmol/L 3.8  Chloride 98 - 111 mmol/L 101  CO2 22 - 32 mmol/L 26  Calcium 8.9 - 10.3 mg/dL 10.1  Total Protein 6.5 - 8.1 g/dL 6.8  Total Bilirubin 0.3 - 1.2 mg/dL 0.6  Alkaline Phos 38 - 126 U/L 111  AST 15 - 41 U/L 18  ALT 0 - 44 U/L 21   CBC Latest Ref Rng & Units 03/08/2020  WBC 4.0 - 10.5 K/uL 3.8(L)  Hemoglobin 12.0 - 15.0 g/dL 14.6  Hematocrit 36 - 46 % 40.9  Platelets 150 - 400 K/uL 263    No images are attached to the encounter.  CT ABDOMEN PELVIS WO CONTRAST  Result Date: 02/16/2020 CLINICAL DATA:  Acute generalized abdominal pain. History of ovarian cancer. EXAM: CT ABDOMEN AND PELVIS WITHOUT CONTRAST TECHNIQUE: Multidetector CT imaging of the abdomen and pelvis was performed following the standard protocol without IV contrast. COMPARISON:  September 29, 2019. FINDINGS: Lower chest: No acute abnormality. Hepatobiliary: No focal liver abnormality is seen. Status post cholecystectomy. No biliary dilatation. Pancreas: Unremarkable. No pancreatic ductal dilatation or surrounding  inflammatory changes. Spleen: Normal in size without focal abnormality. Adrenals/Urinary Tract: Adrenal glands are unremarkable. Kidneys are normal, without renal calculi, focal lesion, or hydronephrosis. Bladder is unremarkable. Stomach/Bowel: Stomach is within normal limits. Appendix appears normal. No abnormal bowel dilatation is noted. Sigmoid diverticulosis is noted with focal inflammatory changes seen best on image number 73 of series 2 which may represent mild diverticulitis. Vascular/Lymphatic: Aortic atherosclerosis. No enlarged abdominal or pelvic lymph nodes. Reproductive: Status post hysterectomy. No adnexal masses. Other: No abdominal wall hernia or abnormality. No abdominopelvic ascites. Musculoskeletal: No acute or significant osseous findings. IMPRESSION: 1. Possible focal diverticulitis seen in the sigmoid colon. No definite abscess is noted. Aortic Atherosclerosis (ICD10-I70.0). Electronically Signed   By: Marijo Conception M.D.   On: 02/16/2020 10:47   MM 3D SCREEN BREAST BILATERAL  Result Date: 02/18/2020 CLINICAL DATA:  Screening. EXAM: DIGITAL SCREENING BILATERAL MAMMOGRAM WITH TOMO AND CAD COMPARISON:  Previous exam(s). ACR Breast Density Category b: There are scattered areas of fibroglandular density. FINDINGS: There are no findings suspicious for malignancy. Images were processed with CAD. IMPRESSION: No mammographic evidence of malignancy. A result letter of this screening mammogram will be mailed directly to the patient. RECOMMENDATION: Screening mammogram in one year. (Code:SM-B-01Y) BI-RADS CATEGORY  1: Negative. Electronically Signed   By: Lajean Manes M.D.   On: 02/18/2020 12:58     Assessment and plan- Patient is a 74 y.o. 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  She is here for on treatment assessment prior to cycle 6 of adjuvant carbotaxol chemotherapy and this is a routine follow-up visit   ovarian cancer: Clinically  doing well with no concerning signs and symptoms of recurrence based on today's exam.  Ca1 25 is normal.  Recent  CT abdomen from July 2021 did not reveal any evidence of malignancy.  Chemo-induced peripheral neuropathy: She is currently on Cymbalta for both anxiety and neuropathy which is currently well controlled.  Continue to monitor. I will see her back in 3 months and following that I will see her every 6 months alternating with GYN oncology  Visit Diagnosis 1. Encounter for follow-up surveillance of ovarian cancer   2. Chemotherapy-induced peripheral neuropathy ([place])      Dr. Randa Evens, MD, MPH [facility] [phone] 03/12/2020 6:09 PM

## 2020-03-29 ENCOUNTER — Ambulatory Visit: Admission: RE | Admit: 2020-03-29 | Payer: Medicare Other | Source: Ambulatory Visit

## 2020-03-29 ENCOUNTER — Ambulatory Visit
Admission: RE | Admit: 2020-03-29 | Discharge: 2020-03-29 | Disposition: A | Discharge status: Home / Self Care | Payer: Medicare Other | Source: Ambulatory Visit | Attending: Family Medicine | Admitting: Family Medicine

## 2020-03-29 ENCOUNTER — Ambulatory Visit
Admission: RE | Admit: 2020-03-29 | Discharge: 2020-03-29 | Disposition: A | Discharge status: Home / Self Care | Payer: Medicare Other | Attending: Family Medicine | Admitting: Family Medicine

## 2020-03-29 ENCOUNTER — Ambulatory Visit (INDEPENDENT_AMBULATORY_CARE_PROVIDER_SITE_OTHER): Payer: Medicare Other | Admitting: Family Medicine

## 2020-03-29 ENCOUNTER — Encounter: Payer: Self-pay | Admitting: Family Medicine

## 2020-03-29 ENCOUNTER — Other Ambulatory Visit: Payer: Self-pay

## 2020-03-29 VITALS — BP 110/80 | HR 88 | Ht 63.0 in | Wt 169.0 lb

## 2020-03-29 DIAGNOSIS — M7731 Calcaneal spur, right foot: Secondary | ICD-10-CM | POA: Diagnosis not present

## 2020-03-29 DIAGNOSIS — Z8781 Personal history of (healed) traumatic fracture: Secondary | ICD-10-CM | POA: Diagnosis not present

## 2020-03-29 DIAGNOSIS — M79671 Pain in right foot: Secondary | ICD-10-CM | POA: Diagnosis not present

## 2020-03-29 DIAGNOSIS — S92354D Nondisplaced fracture of fifth metatarsal bone, right foot, subsequent encounter for fracture with routine healing: Secondary | ICD-10-CM

## 2020-03-29 DIAGNOSIS — W19XXXA Unspecified fall, initial encounter: Secondary | ICD-10-CM

## 2020-03-29 DIAGNOSIS — Z043 Encounter for examination and observation following other accident: Secondary | ICD-10-CM | POA: Diagnosis not present

## 2020-03-29 NOTE — Progress Notes (Signed)
Date:  03/29/2020   Name:  Carrie Roberson   DOB:  [birth year]   MRN:  [id number]   Chief Complaint: Flu Vaccine and Fall (1 week ago- fell when dog was going around her feet- having bruising around inside of foot)  Fall The accident occurred 5 to 7 days ago. The fall occurred while standing (schnauzer induced fall/4 steps). She landed on concrete. The pain is at a severity of 1/10. The pain is mild. The symptoms are aggravated by use of injured limb. Pertinent negatives include no abdominal pain, bowel incontinence, fever, headaches, hearing loss, hematuria, loss of consciousness, nausea, numbness, tingling, visual change or vomiting. She has tried acetaminophen for the symptoms. The treatment provided mild relief.    Lab Results  Component Value Date   CREATININE 0.65 03/08/2020   BUN 11 03/08/2020   NA 133 (L) 03/08/2020   K 3.8 03/08/2020   CL 101 03/08/2020   CO2 26 03/08/2020   Lab Results  Component Value Date   CHOL 188 09/02/2017   HDL 53 09/02/2017   LDLCALC 109 (H) 09/02/2017   TRIG 131 09/02/2017   CHOLHDL 3.5 09/02/2017   Lab Results  Component Value Date   TSH 0.856 09/02/2017   No results found for: HGBA1C Lab Results  Component Value Date   WBC 3.8 (L) 03/08/2020   HGB 14.6 03/08/2020   HCT 40.9 03/08/2020   MCV 92.7 03/08/2020   PLT 263 03/08/2020   Lab Results  Component Value Date   ALT 21 03/08/2020   AST 18 03/08/2020   ALKPHOS 111 03/08/2020   BILITOT 0.6 03/08/2020     Review of Systems  Constitutional: Negative.  Negative for chills, fatigue, fever and unexpected weight change.  HENT: Negative for congestion, ear discharge, ear pain, rhinorrhea, sinus pressure, sneezing and sore throat.   Eyes: Negative for photophobia, pain, discharge, redness and itching.  Respiratory: Negative for cough, shortness of breath, wheezing and stridor.   Gastrointestinal: Negative for abdominal pain, blood in stool, bowel incontinence, constipation, diarrhea,  nausea and vomiting.  Endocrine: Negative for cold intolerance, heat intolerance, polydipsia, polyphagia and polyuria.  Genitourinary: Negative for dysuria, flank pain, frequency, hematuria, menstrual problem, pelvic pain, urgency, vaginal bleeding and vaginal discharge.  Musculoskeletal: Negative for arthralgias, back pain and myalgias.  Skin: Negative for rash.  Allergic/Immunologic: Negative for environmental allergies and food allergies.  Neurological: Negative for dizziness, tingling, loss of consciousness, weakness, light-headedness, numbness and headaches.  Hematological: Negative for adenopathy. Does not bruise/bleed easily.  Psychiatric/Behavioral: Negative for dysphoric mood. The patient is not nervous/anxious.     Patient Active Problem List   Diagnosis Date Noted  . Chemotherapy-induced peripheral neuropathy ([place]) 02/24/2020  . Genetic testing 11/02/2019  . Weight loss, unintentional 10/16/2019  . Chemotherapy induced diarrhea 10/13/2019  . Orthostasis 10/13/2019  . Hypokalemia 10/13/2019  . Restless leg 10/13/2019  . 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 Iron deficiency 10/02/2019  . Anxiety associated with cancer diagnosis 10/02/2019  . Chemotherapy-induced nausea 10/02/2019  . Thyroid nodule 10/02/2019  . Hyponatremia 10/02/2019  . B12 deficiency 09/18/2019  . Family history of breast cancer   . Family history of leukemia   . Goals of care, counseling/discussion 09/07/2019  . Ovarian mass 08/19/2019  . 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) 08/19/2019  . 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 Cyst of ovary 07/15/2019  . Anxiety 09/02/2017  . 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  FH: diabetes mellitus 09/02/2017  . 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 Atypical nevus 09/02/2017  . IBS (irritable bowel syndrome) 09/02/2017  . Atrophic vaginitis 09/02/2017  . Chronic  idiopathic urticaria 09/02/2017  . GERD (gastroesophageal reflux disease) 09/02/2017    Allergies  Allergen Reactions  . Citalopram Nausea And Vomiting  . Ivp Dye [Iodinated Diagnostic Agents] Hives and Swelling    hives  . Meloxicam     unknown  . Adhesive [Tape] Rash  . Amlodipine Rash  . Ampicillin Rash    Did it involve swelling of the face/tongue/throat, SOB, or low BP? No Did it involve sudden or severe rash/hives, skin peeling, or any reaction on the inside of your mouth or nose? Yes Did you need to seek medical attention at a hospital or doctor's office? Yes When did it last happen? 10 + years If all above answers are "NO", may proceed with cephalosporin use.  . Cefoxitin Rash  . Estradiol Rash  . Estropipate Rash  . Olmesartan Medoxomil-Hctz Rash  . Sertraline Rash       . Sulfa Antibiotics Rash    Past Surgical History:  Procedure Laterality Date  . ABDOMINAL HYSTERECTOMY    . BREAST BIOPSY Left    needle bx/clip-neg  . CERVICAL BIOPSY  W/ LOOP ELECTRODE EXCISION    . CHOLECYSTECTOMY    . Colonoscopoy  2007, 2008, 2011  . COLONOSCOPY WITH PROPOFOL N/A 05/05/2018   Procedure: COLONOSCOPY WITH PROPOFOL;  Surgeon: Lin Landsman, MD;  [facility] ENDOSCOPY;  Service: Gastroenterology;  Laterality: N/A;  . HYSTERECTOMY ABDOMINAL WITH SALPINGO-OOPHORECTOMY N/A 08/19/2019   Procedure: HYSTERECTOMY ABDOMINAL WITH SALPINGO-OOPHORECTOMY WITH PELVIC WASHINGS PERITONEAL BIOPSIES AND PARA AORTIC LYMPH NODE DISSECTION;  Surgeon: [name], MD;  Location: [facility] ORS;  Service: Gynecology;  Laterality: N/A;  . LEEP    . OMENTECTOMY N/A 08/19/2019   Procedure: OMENTECTOMY;  Surgeon: [name], MD;  Location: [facility] ORS;  Service: Gynecology;  Laterality: N/A;  . OOPHORECTOMY    . PORTA CATH INSERTION N/A 09/11/2019   Procedure: PORTA CATH INSERTION;  Surgeon: Algernon Huxley, MD;  Location: [place] CV LAB;  Service: Cardiovascular;  Laterality: N/A;    . TUBAL LIGATION  1999    Social History   Tobacco Use  . Smoking status: Never Smoker  . Smokeless tobacco: Never Used  . Tobacco comment: smoking cessation materials not required  Vaping Use  . Vaping Use: Never used  Substance Use Topics  . Alcohol use: Yes    Comment: rarely  . Drug use: No     Medication list has been reviewed and updated.  Current Meds  Medication Sig  . acetaminophen (TYLENOL) 500 MG tablet Take 1,000 mg by mouth every 6 (six) hours as needed (for pain.).  Marland Kitchen cetirizine (ZYRTEC) 10 MG tablet Take 10 mg by mouth daily as needed (allergies.).   Marland Kitchen DULoxetine (CYMBALTA) 60 MG capsule TAKE 1 CAPSULE BY MOUTH EVERY DAY  . lisinopril (ZESTRIL) 5 MG tablet Take 1 tablet (5 mg total) by mouth daily.  Marland Kitchen LORazepam (ATIVAN) 0.5 MG tablet Take 1 tablet (0.5 mg total) by mouth every 6 (six) hours as needed (Nausea/vomiting or anxiety).  . mirabegron ER (MYRBETRIQ) 50 MG TB24 tablet Take 1 tablet (50 mg total) by mouth daily.    PHQ 2/9 Scores 03/29/2020 02/23/2020 03/23/2019 01/06/2018  PHQ - 2 Score 2 2 3  0  PHQ- 9 Score 3 8 9  0  GAD 7 : Generalized Anxiety Score 03/29/2020 02/23/2020 03/23/2019  Nervous, Anxious, on Edge 1 1 1   Control/stop worrying 0 0 0  Worry too much - different things 0 0 2  Trouble relaxing 0 0 0  Restless 0 0 0  Easily annoyed or irritable 1 0 0  Afraid - awful might happen 0 0 1  Total GAD 7 Score 2 1 4   Anxiety Difficulty Not difficult at all Not difficult at all Somewhat difficult    BP Readings from Last 3 Encounters:  03/29/20 110/80  03/08/20 128/61  03/08/20 (!) 153/73    Physical Exam Vitals and nursing note reviewed.  Cardiovascular:     Pulses: Normal pulses.     Heart sounds: S1 normal and S2 normal. No murmur heard.  No systolic murmur is present.  No diastolic murmur is present.  No gallop. No S3 or S4 sounds.   Musculoskeletal:     Right lower leg: No edema.     Left lower leg: No edema.  Skin:    Findings:  Ecchymosis and signs of injury present. Rash is not crusting, macular, nodular, papular, purpuric, pustular, scaling, urticarial or vesicular.     Comments: Abrasions/bruisingposterior to medial malleolus  Neurological:     Mental Status: She is alert.     Wt Readings from Last 3 Encounters:  03/29/20 169 lb (76.7 kg)  03/08/20 171 lb 11.2 oz (77.9 kg)  03/08/20 173 lb (78.5 kg)    BP 110/80   Pulse 88   Ht 5\' 3"  (1.6 m)   Wt 169 lb (76.7 kg)   BMI 29.94 kg/m   Assessment and Plan: 1. Fall, initial encounter Initial visit for evaluation of fall.  Patient was tangled up in a dog leash and fell down 3-4 steps to the concrete.  She flexed her right foot underneath herself sustaining abrasions which are healing well.  She has some ecchymosis and some mild pain in her right foot.  We will obtain an x-ray of the foot given patient has a history of a remote fifth metatarsal fracture. - DG Foot Complete Right; Future  2. Right foot pain Patient does have mild right pain with some prominence of bones which are not as so on the left.  Patient is concerned about fracture or dislocation.  We will obtain an x-ray of the right foot. - DG Foot Complete Right; Future  3. Closed nondisplaced fracture of fifth metatarsal bone of right foot with routine healing, subsequent encounter Patient has a history of a proximal fifth metatarsal fracture.  There is no significant tenderness but patient's history is concerning that she has had 2 fractures of this area in the past. - DG Foot Complete Right; Future

## 2020-03-29 NOTE — Patient Instructions (Signed)
Metatarsal Fracture A metatarsal fracture is a break in one of the five bones that connect the toes to the rest of the foot. This may also be called a forefoot fracture. A metatarsal fracture may be:  A crack in the surface of the bone (stress fracture). This often occurs in athletes.  A break all the way through the bone (complete fracture). The bone that connects to the little toe (fifth metatarsal) is most commonly fractured. Ballet dancers often fracture this bone. What are the causes? A metatarsal fracture may be caused by:  Sudden twisting of the foot.  Falling onto the foot.  Something heavy falling onto the foot.  Overuse or repetitive exercise. What increases the risk? This condition is more likely to develop in people who:  Play contact sports.  Do ballet.  Have a condition that causes the bones to become thin and brittle (osteoporosis).  Have a low calcium level. What are the signs or symptoms? Symptoms of this condition include:  Pain that gets worse when walking or standing.  Pain when pressing on the foot or moving the toes.  Swelling.  Bruising on the top or bottom of the foot. How is this diagnosed? This condition may be diagnosed based on:  Your symptoms.  Any recent foot injuries you have had.  A physical exam.  An X-ray of your foot. If you have a stress fracture, it may not show up on an X-ray, and you may need other imaging tests, such as: ? A bone scan. ? CT scan. ? MRI. How is this treated? Treatment depends on how severe your fracture is and how the pieces of the broken bone line up with each other (alignment). Treatment may involve:  Wearing a cast, splint, or supportive boot on your foot.  Using crutches, and not putting any weight on your foot.  Having surgery to align broken bones (open reduction and internal fixation, ORIF).  Physical therapy.  Follow-up visits and X-rays to make sure you are healing. Follow these instructions  at home: If you have a splint or a supportive boot:  Wear the splint or boot as told by your health care provider. Remove it only as told by your health care provider.  Loosen the splint or boot if your toes tingle, become numb, or turn cold and blue.  Keep the splint or boot clean.  If your splint or boot is not waterproof: ? Do not let it get wet. ? Cover it with a watertight covering when you take a bath or a shower. If you have a cast:  Do not stick anything inside the cast to scratch your skin. Doing that increases your risk for infection.  Check the skin around the cast every day. Tell your health care provider about any concerns.  You may put lotion on dry skin around the edges of the cast. Do not put lotion on the skin underneath the cast.  Keep the cast clean.  If the cast is not waterproof: ? Do not let it get wet. ? Cover it with a watertight covering when you take a bath or a shower. Activity  Do not use your affected leg to support your body weight until your health care provider says that you can. Use crutches as directed.  Ask your health care provider what activities are safe for you during recovery, and ask what activities you need to avoid.  Do physical therapy exercises as directed. Driving  Do not drive or use heavy machinery   while taking pain medicine.  Do not drive while wearing a cast, splint, or boot on a foot that you use for driving. Managing pain, stiffness, and swelling   If directed, put ice on painful areas: ? Put ice in a plastic bag. ? Place a towel between your skin and the bag.  If you have a removable splint or boot, remove it as told by your health care provider.  If you have a cast, place a towel between your cast and the bag. ? Leave the ice on for 20 minutes, 2-3 times a day.  Move your toes often to avoid stiffness and to lessen swelling.  Raise (elevate) your lower leg above the level of your heart while you are sitting or  lying down. General instructions  Do not put pressure on any part of the cast or splint until it is fully hardened. This may take several hours.  Take over-the-counter and prescription medicines only as told by your health care provider.  Do not use any products that contain nicotine or tobacco, such as cigarettes and e-cigarettes. These can delay bone healing. If you need help quitting, ask your health care provider.  Do not take baths, swim, or use a hot tub until your health care provider approves. Ask your health care provider if you may take showers.  Keep all follow-up visits as told by your health care provider. This is important. Contact a health care provider if you have:  Pain that gets worse or does not get better with medicine.  A fever.  A bad smell coming from your cast or splint. Get help right away if you have:  Any of the following in your toes or your foot, even after loosening your splint (if applicable): ? Numbness. ? Tingling. ? Coldness. ? Blue skin.  Redness or swelling that gets worse.  Pain that suddenly becomes severe. Summary  A metatarsal fracture is a break in one of the five bones that connect the toes to the rest of the foot.  Treatment depends on how severe your fracture is and how the pieces of the broken bone line up with each other (alignment). This may include wearing a cast, splint, or supportive boot, or using crutches. Sometimes surgery is needed to align the bones.  Ice and elevate your foot to help lessen the pain and swelling.  Make sure you know what symptoms should cause you to get help right away. This information is not intended to replace advice given to you by your health care provider. Make sure you discuss any questions you have with your health care provider. Document Revised: 10/30/2018 Document Reviewed: 08/05/2017 Elsevier Patient Education  [place].

## 2020-03-31 DIAGNOSIS — Z23 Encounter for immunization: Secondary | ICD-10-CM | POA: Diagnosis not present

## 2020-04-07 ENCOUNTER — Inpatient Hospital Stay: Payer: Medicare Other | Attending: Nurse Practitioner | Admitting: Nurse Practitioner

## 2020-04-07 ENCOUNTER — Other Ambulatory Visit: Payer: Self-pay

## 2020-04-07 VITALS — BP 149/77 | HR 79 | Temp 97.3°F | Resp 17 | Wt 170.0 lb

## 2020-04-07 DIAGNOSIS — T451X5A Adverse effect of antineoplastic and immunosuppressive drugs, initial encounter: Secondary | ICD-10-CM

## 2020-04-07 DIAGNOSIS — Z90722 Acquired absence of ovaries, bilateral: Secondary | ICD-10-CM | POA: Diagnosis not present

## 2020-04-07 DIAGNOSIS — R2689 Other abnormalities of gait and mobility: Secondary | ICD-10-CM | POA: Insufficient documentation

## 2020-04-07 DIAGNOSIS — Z90710 Acquired absence of both cervix and uterus: Secondary | ICD-10-CM | POA: Diagnosis not present

## 2020-04-07 DIAGNOSIS — F419 Anxiety disorder, unspecified: Secondary | ICD-10-CM | POA: Insufficient documentation

## 2020-04-07 DIAGNOSIS — G620 Drug-induced polyneuropathy: Secondary | ICD-10-CM | POA: Insufficient documentation

## 2020-04-07 DIAGNOSIS — C561 Malignant neoplasm of right ovary: Secondary | ICD-10-CM | POA: Diagnosis not present

## 2020-04-07 DIAGNOSIS — Z9221 Personal history of antineoplastic chemotherapy: Secondary | ICD-10-CM | POA: Diagnosis not present

## 2020-04-07 NOTE — Progress Notes (Signed)
Seen today in [facility] clinic.

## 2020-04-08 ENCOUNTER — Telehealth: Payer: Self-pay | Admitting: Occupational Therapy

## 2020-04-08 MED ORDER — GABAPENTIN 300 MG PO CAPS: 300.0000 mg | ORAL_CAPSULE | Freq: Three times a day (TID) | ORAL | 1 refills | Status: DC

## 2020-04-08 NOTE — Telephone Encounter (Signed)
Patient phoned on this date and stated that she would not be able to attend appt on 0-38-88 due to conflicting appt and moved appt to 04-20-20. Port flush also moved to 04-20-20 to help patient with one visit.

## 2020-04-13 ENCOUNTER — Inpatient Hospital Stay: Payer: Medicare Other | Admitting: Occupational Therapy

## 2020-04-14 ENCOUNTER — Other Ambulatory Visit: Payer: Self-pay | Admitting: *Deleted

## 2020-04-14 MED ORDER — PREGABALIN 75 MG PO CAPS
75.0000 mg | ORAL_CAPSULE | Freq: Two times a day (BID) | ORAL | 0 refills | Status: DC
Start: 2020-04-14 — End: 2020-04-14

## 2020-04-14 MED ORDER — PREGABALIN 75 MG PO CAPS: 75.0000 mg | ORAL_CAPSULE | Freq: Two times a day (BID) | ORAL | 0 refills | Status: DC

## 2020-04-14 NOTE — Telephone Encounter (Signed)
It will not let me sign it, so I set it to you to sign

## 2020-04-14 NOTE — Telephone Encounter (Signed)
Patient called reporting that she cannot tolerate the Gabapentin 300 mg tod. He feels off balance, dizzy and sleepy all the time so she has stopped taking it 2 days ago. She is asking if something else can be ordered in place of Gabapentin. Please return her call [phone]

## 2020-04-14 NOTE — Telephone Encounter (Signed)
Please have her stop gabapentin and switch to lyrica 75 mg BID

## 2020-04-15 DIAGNOSIS — Z20822 Contact with and (suspected) exposure to covid-19: Secondary | ICD-10-CM | POA: Diagnosis not present

## 2020-04-18 ENCOUNTER — Telehealth: Payer: Self-pay | Admitting: Family Medicine

## 2020-04-18 NOTE — Telephone Encounter (Signed)
Pls call pt back for sch appt this week, Pt states is having seasonal allergies, She is coughing congested, sneezing, wants medication so states she had a COVID test done last week to confirm it is not covid, states no fever. Anytime this week but Wednesday CB [phone]

## 2020-04-20 ENCOUNTER — Other Ambulatory Visit: Payer: Self-pay

## 2020-04-20 ENCOUNTER — Inpatient Hospital Stay: Payer: Medicare Other

## 2020-04-20 ENCOUNTER — Inpatient Hospital Stay: Payer: Medicare Other | Admitting: Occupational Therapy

## 2020-04-20 DIAGNOSIS — T451X5A Adverse effect of antineoplastic and immunosuppressive drugs, initial encounter: Secondary | ICD-10-CM | POA: Diagnosis not present

## 2020-04-20 DIAGNOSIS — Z90710 Acquired absence of both cervix and uterus: Secondary | ICD-10-CM | POA: Diagnosis not present

## 2020-04-20 DIAGNOSIS — Z9221 Personal history of antineoplastic chemotherapy: Secondary | ICD-10-CM | POA: Diagnosis not present

## 2020-04-20 DIAGNOSIS — Z95828 Presence of other vascular implants and grafts: Secondary | ICD-10-CM

## 2020-04-20 DIAGNOSIS — G620 Drug-induced polyneuropathy: Secondary | ICD-10-CM | POA: Diagnosis not present

## 2020-04-20 DIAGNOSIS — C561 Malignant neoplasm of right ovary: Secondary | ICD-10-CM

## 2020-04-20 DIAGNOSIS — Z90722 Acquired absence of ovaries, bilateral: Secondary | ICD-10-CM | POA: Diagnosis not present

## 2020-04-20 MED ORDER — HEPARIN SOD (PORK) LOCK FLUSH 100 UNIT/ML IV SOLN
500.0000 [IU] | Freq: Once | INTRAVENOUS | Status: AC
  Administered 2020-04-20: 500 [IU] via INTRAVENOUS
  Filled 2020-04-20: qty 5

## 2020-04-20 MED ORDER — SODIUM CHLORIDE 0.9% FLUSH
10.0000 mL | Freq: Once | INTRAVENOUS | Status: AC
  Administered 2020-04-20: 10 mL via INTRAVENOUS
  Filled 2020-04-20: qty 10

## 2020-04-20 NOTE — Therapy (Signed)
[facility] [address] [place], [place], [place], [address] Phone: [phone]   Fax:  [phone]  Occupational Therapy Screen  Patient Details  Name: Carrie Roberson MRN: [id number] Date of Birth: March 28, 1946 No data recorded  Encounter Date: 04/20/2020    Past Medical History:  Diagnosis Date  . Anxiety   . Arthritis   . Family history of breast cancer   . Family history of leukemia   . GERD (gastroesophageal reflux disease)   . History of ischemic colitis 09/02/2017  . Hx of colonic polyp   . Hypertension    H/O-PT TAKEN OFF BY PCP AS OF 2019  . IBS (irritable bowel syndrome)   . Ovarian cancer ([place])   . Ovarian cyst   . Personal history of chemotherapy 2021   6 treatments  . Vaginal atrophy     Past Surgical History:  Procedure Laterality Date  . ABDOMINAL HYSTERECTOMY    . BREAST BIOPSY Left    needle bx/clip-neg  . CERVICAL BIOPSY  W/ LOOP ELECTRODE EXCISION    . CHOLECYSTECTOMY    . Colonoscopoy  2007, 2008, 2011  . COLONOSCOPY WITH PROPOFOL N/A 05/05/2018   Procedure: COLONOSCOPY WITH PROPOFOL;  Surgeon: Lin Landsman, MD;  Location[facility] ENDOSCOPY;  Service: Gastroenterology;  Laterality: N/A;  . HYSTERECTOMY ABDOMINAL WITH SALPINGO-OOPHORECTOMY N/A 08/19/2019   Procedure: HYSTERECTOMY ABDOMINAL WITH SALPINGO-OOPHORECTOMY WITH PELVIC WASHINGS PERITONEAL BIOPSIES AND PARA AORTIC LYMPH NODE DISSECTION;  Surgeon: [name], MD;  Location: [facility] ORS;  Service: Gynecology;  Laterality: N/A;  . LEEP    . OMENTECTOMY N/A 08/19/2019   Procedure: OMENTECTOMY;  Surgeon: [name], MD;  Location: [facility] ORS;  Service: Gynecology;  Laterality: N/A;  . OOPHORECTOMY    . PORTA CATH INSERTION N/A 09/11/2019   Procedure: PORTA CATH INSERTION;  Surgeon: Algernon Huxley, MD;  Location: [place] CV LAB;  Service: Cardiovascular;  Laterality: N/A;  . TUBAL LIGATION  1999    There were no vitals filed for  this visit.   Subjective Assessment - 04/20/20 1103    Subjective  I stopped the medication for my neuropathy 2 days ago - it makes me dizzy and loopy - hope you can help me - I think I broke my little toe again with that fall I had about 4 wks ago - little puppy running around me    Currently in Pain? Yes    Pain Score 5    at night time increase to 8/10   Pain Location Foot    Pain Orientation Right;Left    Pain Descriptors / Indicators Burning;Tingling;Pins and needles   stinging   Pain Onset More than a month ago    Pain Frequency Constant    Aggravating Factors  night time               Pt was refer by Beckey Rutter NP on 04/07/2020 because of pt had a fall around 03/23/2020 and has chemo induce neuropathy - on 3rd drug for symptoms management but causing dizziness, drowsiness.  LAST NOTE FROM DR RAO ON 03/08/2020:   Assessment and plan- Patient is a 74 y.o. 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  Ca1 25  is normal.  Recent CT abdomen from July 2021 did not reveal any evidence of malignancy.  Chemo-induced peripheral neuropathy: She is currently on Cymbalta for both anxiety and neuropathy which is currently well controlled.  Continue to monitor. I will see her back in 3 months and following that I will see her every 6 months alternating with GYN oncology  Visit Diagnosis 1. Encounter for follow-up surveillance of ovarian cancer   2. Chemotherapy-induced peripheral neuropathy (HCC)     Pt is very pleasant lady that report her neuropathy about 5/10 during day in feet worse than hands , and at night time increase to 8/10.  Fingers is about 1/10  She stopped 2 days ago her 3rd drug she  tried for symptoms of neuropathy - Lyrica. Pt wear sandals this date in clinic - had fall about 03/23/2020 resulting in possible little toe fx - had on xray history of 2 fractures on 5th metatarsal. Pt and sister lives together - she is upstairs , sister down stairs - 15 steps with railing to go up and down. She has everything she needs upstairs - but do steps about 4-5 x day. Pt show decrease strength in hip flexion , ABD and Ext. Balance test 47/56 - low risk for falling Pt was not as active during chemo, and now too with the neuropathy - and being "not exercise person "- ( does more needlework type of hobbies) show decrease core and hip strength - and causing some decrease balance.  Discuss with pt options for neuropathy - try first acupuncture - NP did referral and pt to contact one that is closes to her house Also recommend pt to CARE program for exercises - increase blood flow and increase strength and balance. As well as mental health. Referral made by Lauren NP   Review with pt hand out for fall prevention and modifications at home. 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 Chemotherapy-induced peripheral neuropathy ([place]) 02/24/2020  . Genetic testing 11/02/2019  . Weight loss, unintentional  10/16/2019  . Chemotherapy induced diarrhea 10/13/2019  . Orthostasis 10/13/2019  . Hypokalemia 10/13/2019  . Restless leg 10/13/2019  . 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 Iron deficiency 10/02/2019  . Anxiety associated with cancer diagnosis 10/02/2019  . Chemotherapy-induced nausea 10/02/2019  . Thyroid nodule 10/02/2019  . Hyponatremia 10/02/2019  . B12 deficiency 09/18/2019  . Family history of breast cancer   . Family history of leukemia   . Goals of care, counseling/discussion 09/07/2019  . Ovarian mass 08/19/2019  . 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) 08/19/2019  . Malignant  neoplasm of right ovary ([place])   . Cyst of ovary 07/15/2019  . Anxiety 09/02/2017  . 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 FH: diabetes mellitus 09/02/2017  . 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 Atypical nevus 09/02/2017  . IBS (irritable bowel syndrome) 09/02/2017  . Atrophic vaginitis 09/02/2017  . Chronic idiopathic urticaria 09/02/2017  . GERD (gastroesophageal reflux disease) 09/02/2017    Rosalyn Gess OTR/L,CLT 04/20/2020, 11:06 AM  [facility] [address] [place], [place], [place], [address] Phone: [phone]   Fax:  [phone]  Name: Jackelyne Sayer MRN: [id number] Date of Birth: July 31, 1945

## 2020-04-20 NOTE — Patient Instructions (Signed)
As we discussed, you may notice some improvement in your symptoms with acupuncture. Below is the names of some local clinics that offer our cancer survivors a discounted rate of $40/session. You can contact them for an appointment at your convenience.  - [facility]- [phone] - [facility]- [phone] - [facility]- [phone] ([place]) or [phone] ([place])  

## 2020-04-21 ENCOUNTER — Encounter: Payer: Self-pay | Admitting: Family Medicine

## 2020-04-21 ENCOUNTER — Other Ambulatory Visit: Payer: Self-pay

## 2020-04-21 ENCOUNTER — Ambulatory Visit (INDEPENDENT_AMBULATORY_CARE_PROVIDER_SITE_OTHER): Payer: Medicare Other | Admitting: Family Medicine

## 2020-04-21 VITALS — BP 130/86 | HR 64 | Ht 63.0 in | Wt 172.0 lb

## 2020-04-21 DIAGNOSIS — R05 Cough: Secondary | ICD-10-CM | POA: Diagnosis not present

## 2020-04-21 DIAGNOSIS — R059 Cough, unspecified: Secondary | ICD-10-CM

## 2020-04-21 DIAGNOSIS — J0100 Acute maxillary sinusitis, unspecified: Secondary | ICD-10-CM | POA: Diagnosis not present

## 2020-04-21 MED ORDER — AZITHROMYCIN 250 MG PO TABS: ORAL_TABLET | ORAL | 0 refills | Status: DC

## 2020-04-21 MED ORDER — GUAIFENESIN-CODEINE 100-10 MG/5ML PO SYRP: 5.0000 mL | ORAL_SOLUTION | Freq: Four times a day (QID) | ORAL | 0 refills | Status: DC | PRN

## 2020-04-21 MED ORDER — FLUCONAZOLE 150 MG PO TABS: 150.0000 mg | ORAL_TABLET | Freq: Once | ORAL | 0 refills | Status: AC

## 2020-04-21 NOTE — Progress Notes (Signed)
Date:  04/21/2020   Name:  Carrie Roberson   DOB:  03-14-1946   MRN:  [id number]   Chief Complaint: Allergic Rhinitis  (been taking claritin and flonase x 3 weeks- not helping with cough, drainage- gets worse at night)  Sinusitis This is a new problem. The current episode started 1 to 4 weeks ago. The problem has been gradually worsening since onset. There has been no fever. Associated symptoms include congestion, coughing, headaches, sinus pressure and sneezing. Pertinent negatives include no chills, diaphoresis, ear pain, hoarse voice, neck pain, shortness of breath, sore throat or swollen glands. (Nonprod nasal drainage) Treatments tried: flonase/claritin. The treatment provided no relief.    Lab Results  Component Value Date   CREATININE 0.65 03/08/2020   BUN 11 03/08/2020   NA 133 (L) 03/08/2020   K 3.8 03/08/2020   CL 101 03/08/2020   CO2 26 03/08/2020   Lab Results  Component Value Date   CHOL 188 09/02/2017   HDL 53 09/02/2017   LDLCALC 109 (H) 09/02/2017   TRIG 131 09/02/2017   CHOLHDL 3.5 09/02/2017   Lab Results  Component Value Date   TSH 0.856 09/02/2017   No results found for: HGBA1C Lab Results  Component Value Date   WBC 3.8 (L) 03/08/2020   HGB 14.6 03/08/2020   HCT 40.9 03/08/2020   MCV 92.7 03/08/2020   PLT 263 03/08/2020   Lab Results  Component Value Date   ALT 21 03/08/2020   AST 18 03/08/2020   ALKPHOS 111 03/08/2020   BILITOT 0.6 03/08/2020     Review of Systems  Constitutional: Negative.  Negative for chills, diaphoresis, fatigue, fever and unexpected weight change.  HENT: Positive for congestion, sinus pressure and sneezing. Negative for ear discharge, ear pain, hoarse voice, rhinorrhea and sore throat.   Eyes: Negative for photophobia, pain, discharge, redness and itching.  Respiratory: Positive for cough. Negative for shortness of breath, wheezing and stridor.   Cardiovascular: Negative for chest pain, palpitations and leg swelling.    Gastrointestinal: Negative for abdominal pain, blood in stool, constipation, diarrhea, nausea and vomiting.  Endocrine: Negative for cold intolerance, heat intolerance, polydipsia, polyphagia and polyuria.  Genitourinary: Negative for dysuria, flank pain, frequency, hematuria, menstrual problem, pelvic pain, urgency, vaginal bleeding and vaginal discharge.  Musculoskeletal: Negative for arthralgias, back pain, myalgias and neck pain.  Skin: Negative for rash.  Allergic/Immunologic: Negative for environmental allergies and food allergies.  Neurological: Positive for headaches. Negative for dizziness, weakness, light-headedness and numbness.  Hematological: Negative for adenopathy. Does not bruise/bleed easily.  Psychiatric/Behavioral: Negative for dysphoric mood. The patient is not nervous/anxious.     Patient Active Problem List   Diagnosis Date Noted  . Chemotherapy-induced peripheral neuropathy ([place]) 02/24/2020  . Genetic testing 11/02/2019  . Weight loss, unintentional 10/16/2019  . Chemotherapy induced diarrhea 10/13/2019  . Orthostasis 10/13/2019  . Hypokalemia 10/13/2019  . Restless leg 10/13/2019  . 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 Iron deficiency 10/02/2019  . Anxiety associated with cancer diagnosis 10/02/2019  . Chemotherapy-induced nausea 10/02/2019  . Thyroid nodule 10/02/2019  . Hyponatremia 10/02/2019  . B12 deficiency 09/18/2019  . Family history of breast cancer   . Family history of leukemia   . Goals of care, counseling/discussion 09/07/2019  . Ovarian mass 08/19/2019  . 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) 08/19/2019  . 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 Cyst of ovary 07/15/2019  . Anxiety 09/02/2017  .  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 FH: diabetes mellitus 09/02/2017  . 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 Atypical nevus 09/02/2017  . IBS  (irritable bowel syndrome) 09/02/2017  . Atrophic vaginitis 09/02/2017  . Chronic idiopathic urticaria 09/02/2017  . GERD (gastroesophageal reflux disease) 09/02/2017    Allergies  Allergen Reactions  . Citalopram Nausea And Vomiting  . Ivp Dye [Iodinated Diagnostic Agents] Hives and Swelling    hives  . Meloxicam     unknown  . Adhesive [Tape] Rash  . Amlodipine Rash  . Ampicillin Rash    Did it involve swelling of the face/tongue/throat, SOB, or low BP? No Did it involve sudden or severe rash/hives, skin peeling, or any reaction on the inside of your mouth or nose? Yes Did you need to seek medical attention at a hospital or doctor's office? Yes When did it last happen? 10 + years If all above answers are "NO", may proceed with cephalosporin use.  . Cefoxitin Rash  . Estradiol Rash  . Estropipate Rash  . Olmesartan Medoxomil-Hctz Rash  . Sertraline Rash       . Sulfa Antibiotics Rash    Past Surgical History:  Procedure Laterality Date  . ABDOMINAL HYSTERECTOMY    . BREAST BIOPSY Left    needle bx/clip-neg  . CERVICAL BIOPSY  W/ LOOP ELECTRODE EXCISION    . CHOLECYSTECTOMY    . Colonoscopoy  2007, 2008, 2011  . COLONOSCOPY WITH PROPOFOL N/A 05/05/2018   Procedure: COLONOSCOPY WITH PROPOFOL;  Surgeon: Lin Landsman, MD;  Location: [facility] ENDOSCOPY;  Service: Gastroenterology;  Laterality: N/A;  . HYSTERECTOMY ABDOMINAL WITH SALPINGO-OOPHORECTOMY N/A 08/19/2019   Procedure: HYSTERECTOMY ABDOMINAL WITH SALPINGO-OOPHORECTOMY WITH PELVIC WASHINGS PERITONEAL BIOPSIES AND PARA AORTIC LYMPH NODE DISSECTION;  Surgeon: [name], MD;  Location: [facility] ORS;  Service: Gynecology;  Laterality: N/A;  . LEEP    . OMENTECTOMY N/A 08/19/2019   Procedure: OMENTECTOMY;  Surgeon: [name], MD;  Location: [facility] ORS;  Service: Gynecology;  Laterality: N/A;  . OOPHORECTOMY    . PORTA CATH INSERTION N/A 09/11/2019   Procedure: PORTA CATH INSERTION;  Surgeon: Algernon Huxley, MD;  Location: [place] [facility];  Service: Cardiovascular;  Laterality: N/A;  . TUBAL LIGATION  1999    Social History   Tobacco Use  . Smoking status: Never Smoker  . Smokeless tobacco: Never Used  . Tobacco comment: smoking cessation materials not required  Vaping Use  . Vaping Use: Never used  Substance Use Topics  . Alcohol use: Yes    Comment: rarely  . Drug use: No     Medication list has been reviewed and updated.  Current Meds  Medication Sig  . acetaminophen (TYLENOL) 500 MG tablet Take 1,000 mg by mouth every 6 (six) hours as needed (for pain.).  Marland Kitchen cetirizine (ZYRTEC) 10 MG tablet Take 10 mg by mouth daily as needed (allergies.).   Marland Kitchen fluticasone (FLONASE) 50 MCG/ACT nasal spray Place 1 spray into both nostrils daily. otc  . lisinopril (ZESTRIL) 5 MG tablet Take 1 tablet (5 mg total) by mouth daily.  Marland Kitchen loratadine (CLARITIN) 10 MG tablet Take 10 mg by mouth daily. otc  . LORazepam (ATIVAN) 0.5 MG tablet Take 1 tablet (0.5 mg total) by mouth every 6 (six) hours as needed (Nausea/vomiting or anxiety).  . mirabegron ER (MYRBETRIQ) 50 MG TB24 tablet Take 1 tablet (50 mg total) by mouth daily.    PHQ 2/9 Scores 03/29/2020  02/23/2020 03/23/2019 01/06/2018  PHQ - 2 Score 2 2 3  0  PHQ- 9 Score 3 8 9  0    GAD 7 : Generalized Anxiety Score 03/29/2020 02/23/2020 03/23/2019  Nervous, Anxious, on Edge 1 1 1   Control/stop worrying 0 0 0  Worry too much - different things 0 0 2  Trouble relaxing 0 0 0  Restless 0 0 0  Easily annoyed or irritable 1 0 0  Afraid - awful might happen 0 0 1  Total GAD 7 Score 2 1 4   Anxiety Difficulty Not difficult at all Not difficult at all Somewhat difficult    BP Readings from Last 3 Encounters:  04/21/20 130/86  04/07/20 (!) 149/77  03/29/20 110/80    Physical Exam Vitals and nursing note reviewed.  Constitutional:      Appearance: She is well-developed.  HENT:     Head: Normocephalic.     Right Ear: Tympanic membrane, ear canal and  external ear normal. There is no impacted cerumen.     Left Ear: Tympanic membrane, ear canal and external ear normal. There is no impacted cerumen.     Nose: Nose normal.     Mouth/Throat:     Mouth: Mucous membranes are moist.  Eyes:     General: Lids are everted, no foreign bodies appreciated. No scleral icterus.       Left eye: No foreign body or hordeolum.     Conjunctiva/sclera: Conjunctivae normal.     Right eye: Right conjunctiva is not injected.     Left eye: Left conjunctiva is not injected.     Pupils: Pupils are equal, round, and reactive to light.  Neck:     Thyroid: No thyromegaly.     Vascular: No JVD.     Trachea: No tracheal deviation.  Cardiovascular:     Rate and Rhythm: Normal rate and regular rhythm.     Heart sounds: Normal heart sounds. No murmur heard.  No friction rub. No gallop.   Pulmonary:     Effort: Pulmonary effort is normal. No respiratory distress.     Breath sounds: Normal breath sounds. No wheezing, rhonchi or rales.  Abdominal:     General: Bowel sounds are normal.     Palpations: Abdomen is soft. There is no mass.     Tenderness: There is no abdominal tenderness. There is no guarding or rebound.  Musculoskeletal:        General: No tenderness. Normal range of motion.     Cervical back: Normal range of motion and neck supple.  Lymphadenopathy:     Cervical: No cervical adenopathy.  Skin:    General: Skin is warm.     Findings: No bruising, erythema or rash.  Neurological:     Mental Status: She is alert and oriented to person, place, and time.     Cranial Nerves: No cranial nerve deficit.     Deep Tendon Reflexes: Reflexes normal.  Psychiatric:        Mood and Affect: Mood is not anxious or depressed.     Wt Readings from Last 3 Encounters:  04/21/20 172 lb (78 kg)  04/07/20 170 lb (77.1 kg)  03/29/20 169 lb (76.7 kg)    BP 130/86   Pulse 64   Ht 5\' 3"  (1.6 m)   Wt 172 lb (78 kg)   BMI 30.47 kg/m   Assessment and Plan:     1. Acute maxillary sinusitis, recurrence not specified acute.  Episodic.  Patient is  having another exacerbation of sinus infection as noted in history and physical exam.  We will repeat a azithromycin to 50 mg 2 today followed by 1 a day for 4 days. - azithromycin (ZITHROMAX) 250 MG tablet; 2 today then 1 a day for 4 days  Dispense: 6 tablet; Refill: 0 - guaiFENesin-codeine (ROBITUSSIN AC) 100-10 MG/5ML syrup; Take 5 mLs by mouth 4 (four) times daily as needed for cough.  Dispense: 118 mL; Refill: 0 - fluconazole (DIFLUCAN) 150 MG tablet; Take 1 tablet (150 mg total) by mouth once for 1 dose.  Dispense: 1 tablet; Refill: 0  2. Cough Patient has nocturnal cough that is keeping her up at night.  We have discussed taking Mucinex DM during the day and using her Robitussin-AC at night to help her suppress the cough so she can sleep. - guaiFENesin-codeine (ROBITUSSIN AC) 100-10 MG/5ML syrup; Take 5 mLs by mouth 4 (four) times daily as needed for cough.  Dispense: 118 mL; Refill: 0

## 2020-04-22 ENCOUNTER — Encounter: Payer: Self-pay | Admitting: Nurse Practitioner

## 2020-04-22 NOTE — Progress Notes (Signed)
Survivorship Clinic Consult Note [facility]  [phone] [phone]  Patient Care Team: Juline Patch, MD as PCP - General (Family Medicine) Lin Landsman, MD as Consulting Physician (Gastroenterology) Clent Jacks, RN as Oncology Nurse Navigator Mellody Drown, MD as Referring Physician (Obstetrics) Sindy Guadeloupe, MD as Consulting Physician (Oncology) [name], MD as Consulting Physician (Obstetrics and Gynecology)   Name of the patient: Carrie Roberson  [id number]  Nov 28, 1945   Date of visit: 04/22/20  CLINIC:  Survivorship   REASON FOR VISIT:  Routine follow-up post-treatment for a recent history of ovarian cancer  BRIEF ONCOLOGIC HISTORY:  Oncology History  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[place])  08/19/2019 Initial Diagnosis   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[place])   09/07/2019 Cancer Staging   Staging form: Ovary, Fallopian Tube, and Primary Peritoneal Carcinoma, AJCC 8th Edition - Pathologic stage from 09/07/2019: FIGO Stage IC2, calculated as Stage IC (pT1c2, pN0, cM0) - Signed by Sindy Guadeloupe, MD on 09/07/2019   09/15/2019 -  Chemotherapy   The patient had dexamethasone (DECADRON) 4 MG tablet, 8 mg, Oral, Daily, 1 of 1 cycle, Start date: 11/16/2019, End date: 02/18/2020 palonosetron (ALOXI) injection 0.25 mg, 0.25 mg, Intravenous,  Once, 6 of 6 cycles Administration: 0.25 mg (09/15/2019), 0.25 mg (10/06/2019), 0.25 mg (10/27/2019), 0.25 mg (11/17/2019), 0.25 mg (12/08/2019), 0.25 mg (01/05/2020) pegfilgrastim (NEULASTA ONPRO KIT) injection 6 mg, 6 mg, Subcutaneous, Once, 6 of 6 cycles Administration: 6 mg (09/15/2019), 6 mg (10/06/2019), 6 mg (10/27/2019), 6 mg (11/17/2019), 6 mg (12/08/2019), 6 mg (01/05/2020) CARBOplatin (PARAPLATIN) 350 mg in sodium chloride 0.9 % 250 mL chemo infusion, 350 mg (100 % of original dose 354.8 mg), Intravenous,  Once, 6 of 6 cycles Dose modification:   (original dose 354.8 mg, Cycle 1),    (original dose 354.8 mg, Cycle 6),   (original dose 354.8 mg, Cycle 6) Administration: 350 mg (09/15/2019), 350 mg (10/06/2019), 350 mg (10/27/2019), 350 mg (11/17/2019), 350 mg (12/08/2019), 350 mg (01/05/2020) fosaprepitant (EMEND) 150 mg in sodium chloride 0.9 % 145 mL IVPB, 150 mg, Intravenous,  Once, 6 of 6 cycles Administration: 150 mg (09/15/2019), 150 mg (10/06/2019), 150 mg (10/27/2019), 150 mg (11/17/2019), 150 mg (12/08/2019), 150 mg (01/05/2020) PACLitaxel (TAXOL) 336 mg in sodium chloride 0.9 % 500 mL chemo infusion (> 39m/m2), 175 mg/m2 = 336 mg, Intravenous,  Once, 6 of 6 cycles Administration: 336 mg (09/15/2019), 336 mg (10/06/2019), 336 mg (10/27/2019), 336 mg (11/17/2019), 336 mg (12/08/2019), 336 mg (01/05/2020)  for chemotherapy treatment.    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  The CancerNext gene panel offered by [employer] sequencing and rearrangement analysis for the following 34 genes:   APC, ATM, BARD1, BMPR1A, BRCA1, BRCA2, BRIP1, CDH1, CDK4, CDKN2A, CHEK2, DICER1, HOXB13, EPCAM, GREM1, MLH1, MRE11A, MSH2, MSH6, MUTYH, NBN, NF1, PALB2, PMS2, POLD1, POLE, PTEN, RAD50, RAD51C, RAD51D, SMAD4, SMARCA4, STK11, and TP53.  The report date is 10/29/2019.  TumorHRD testing was negative.  This suggests that the patient's tumor is not more susceptible to PARP inhibitor.     HISTORY OF PRESENTING ILLNESS: Carrie Roberson is a 74y.o. year old female patient who presents to the survivorship clinic today for initial meeting to review her survivorship care plan detailing her treatment course for ovarian cancer, as well as monitoring long-term side effects of that treatment, education regarding health maintenance, screening, and overall wellness and health promotion.  Overall, she  reports feeling well since completing treatment.  She offers no specific complaints today.  We discussed the cancer survivorship of at least 2, meaning: Having no signs of  cancer after finishing treatment and living with, through, and beyond cancer. This means the cancer survivorship starts at diagnosis and includes people who receive treatment over a longer time.  There are treatment can lower the chance of the cancer coming back for help to keep the cancer from spreading.    REVIEW OF SYSTEMS:  Review of Systems  Constitutional: Positive for malaise/fatigue. Negative for chills, fever and weight loss.  HENT: Negative for hearing loss, nosebleeds, sore throat and tinnitus.   Eyes: Negative for blurred vision and double vision.  Respiratory: Negative for cough, hemoptysis, shortness of breath and wheezing.   Cardiovascular: Negative for chest pain, palpitations and leg swelling.  Gastrointestinal: Negative for abdominal pain, blood in stool, constipation, diarrhea, melena, nausea and vomiting.  Genitourinary: Negative for dysuria and urgency.  Musculoskeletal: Negative for back pain, falls, joint pain and myalgias.  Skin: Negative for itching and rash.  Neurological: Positive for tingling. Negative for dizziness, sensory change, loss of consciousness, weakness and headaches.  Endo/Heme/Allergies: Negative for environmental allergies. Does not bruise/bleed easily.  Psychiatric/Behavioral: Positive for memory loss. Negative for depression. The patient is nervous/anxious. The patient does not have insomnia.   Breast: She performs self breast exams.    ONCOLOGY TREATMENT TEAM:  1. Gyn-Oncologist:  Dr. Fransisca Connors 2. Medical Oncologist: Dr. Janese Banks 3. Gynecologist: Dr. Georgianne Fick    PAST MEDICAL/SURGICAL HISTORY:  Past Medical History:  Diagnosis Date  . Anxiety   . Arthritis   . Family history of breast cancer   . Family history of leukemia   . GERD (gastroesophageal reflux disease)   . History of ischemic colitis 09/02/2017  . Hx of colonic polyp   . Hypertension    H/O-PT TAKEN OFF BY PCP AS OF 2019  . IBS (irritable bowel syndrome)   . Ovarian cancer ([place])    . Ovarian cyst   . Personal history of chemotherapy 2021   6 treatments  . Vaginal atrophy    Past Surgical History:  Procedure Laterality Date  . ABDOMINAL HYSTERECTOMY    . BREAST BIOPSY Left    needle bx/clip-neg  . CERVICAL BIOPSY  W/ LOOP ELECTRODE EXCISION    . CHOLECYSTECTOMY    . Colonoscopoy  2007, 2008, 2011  . COLONOSCOPY WITH PROPOFOL N/A 05/05/2018   Procedure: COLONOSCOPY WITH PROPOFOL;  Surgeon: Lin Landsman, MD;  Location: [facility] ENDOSCOPY;  Service: Gastroenterology;  Laterality: N/A;  . HYSTERECTOMY ABDOMINAL WITH SALPINGO-OOPHORECTOMY N/A 08/19/2019   Procedure: HYSTERECTOMY ABDOMINAL WITH SALPINGO-OOPHORECTOMY WITH PELVIC WASHINGS PERITONEAL BIOPSIES AND PARA AORTIC LYMPH NODE DISSECTION;  Surgeon: [name], MD;  Location: [facility] ORS;  Service: Gynecology;  Laterality: N/A;  . LEEP    . OMENTECTOMY N/A 08/19/2019   Procedure: OMENTECTOMY;  Surgeon: [name], MD;  Location: [facility] ORS;  Service: Gynecology;  Laterality: N/A;  . OOPHORECTOMY    . PORTA CATH INSERTION N/A 09/11/2019   Procedure: PORTA CATH INSERTION;  Surgeon: Algernon Huxley, MD;  Location: [facility] CV LAB;  Service: Cardiovascular;  Laterality: N/A;  . TUBAL LIGATION  1999     ALLERGIES:  Allergies  Allergen Reactions  . Citalopram Nausea And Vomiting  . Ivp Dye [Iodinated Diagnostic Agents] Hives and Swelling    hives  . Meloxicam     unknown  . Adhesive [Tape] Rash  . Amlodipine Rash  .  Ampicillin Rash    Did it involve swelling of the face/tongue/throat, SOB, or low BP? No Did it involve sudden or severe rash/hives, skin peeling, or any reaction on the inside of your mouth or nose? Yes Did you need to seek medical attention at a hospital or doctor's office? Yes When did it last happen? 10 + years If all above answers are "NO", may proceed with cephalosporin use.  . Cefoxitin Rash  . Estradiol Rash  . Estropipate Rash  . Olmesartan Medoxomil-Hctz Rash  .  Sertraline Rash       . Sulfa Antibiotics Rash     CURRENT MEDICATIONS:  Outpatient Encounter Medications as of 03/03/2020  Medication Sig  . acetaminophen (TYLENOL) 500 MG tablet Take 1,000 mg by mouth every 6 (six) hours as needed (for pain.).  Marland Kitchen cetirizine (ZYRTEC) 10 MG tablet Take 10 mg by mouth daily as needed (allergies.).   Marland Kitchen lisinopril (ZESTRIL) 5 MG tablet Take 1 tablet (5 mg total) by mouth daily.  Marland Kitchen LORazepam (ATIVAN) 0.5 MG tablet Take 1 tablet (0.5 mg total) by mouth every 6 (six) hours as needed (Nausea/vomiting or anxiety).  . [DISCONTINUED] DULoxetine (CYMBALTA) 60 MG capsule Take 1 capsule (60 mg total) by mouth daily.  . [DISCONTINUED] fluconazole (DIFLUCAN) 150 MG tablet Take 1 tablet (150 mg total) by mouth daily.  . [DISCONTINUED] mirabegron ER (MYRBETRIQ) 50 MG TB24 tablet Take 1 tablet (50 mg total) by mouth daily.   Facility-Administered Encounter Medications as of 03/03/2020  Medication  . sodium chloride flush (NS) 0.9 % injection 10 mL     ONCOLOGIC FAMILY HISTORY:  Family History  Problem Relation Age of Onset  . Heart failure Mother   . Atrial fibrillation Mother   . Hypertension Mother   . Diabetes Father   . Heart attack Father   . Breast cancer Maternal Aunt 70  . Breast cancer Maternal Grandmother 80  . Breast cancer Cousin        2 mat cousins  . Hypertension Sister   . Hypertension Sister   . Arthritis Sister   . Leukemia Paternal Aunt   . Leukemia Paternal Uncle   . Brain cancer Paternal Aunt   . Other Brother 13       Drowning accident  . Liver cancer Maternal Uncle   . Alcohol abuse Maternal Uncle   . Prostate cancer Neg Hx   . Kidney cancer Neg Hx   . BRCA 1/2 Neg Hx      GENETIC COUNSELING/TESTING: BRCA2 p.[id number] VUS found on the cancerNext germline panel. The CancerNext gene panel offered by [employer] includes sequencing and rearrangement analysis for the following 34 genes: APC, ATM, BARD1, BMPR1A, BRCA1, BRCA2,  BRIP1, CDH1, CDK4, CDKN2A, CHEK2, DICER1, HOXB13, EPCAM, GREM1, MLH1, MRE11A, MSH2, MSH6, MUTYH, NBN, NF1, PALB2, PMS2, POLD1, POLE, PTEN, RAD50, RAD51C, RAD51D, SMAD4, SMARCA4, STK11, and TP53. The report date is 10/29/2019.  TumorHRD testing was negative.   SOCIAL HISTORY:  Social History   Socioeconomic History  . Marital status: Single    Spouse name: Not on file  . Number of children: 0  . Years of education: Not on file  . Highest education level: Associate degree: academic program  Occupational History  . Occupation: Retired  Tobacco Use  . Smoking status: Never Smoker  . Smokeless tobacco: Never Used  . Tobacco comment: smoking cessation materials not required  Vaping Use  . Vaping Use: Never used  Substance and Sexual Activity  . Alcohol use:  Yes    Comment: rarely  . Drug use: No  . Sexual activity: Never    Birth control/protection: Post-menopausal  Other Topics Concern  . Not on file  Social History Narrative  . Not on file   Social Determinants of Health   Financial Resource Strain:   . Difficulty of Paying Living Expenses: Not on file  Food Insecurity:   . Worried About Charity fundraiser in the Last Year: Not on file  . Ran Out of Food in the Last Year: Not on file  Transportation Needs:   . Lack of Transportation (Medical): Not on file  . Lack of Transportation (Non-Medical): Not on file  Physical Activity:   . Days of Exercise per Week: Not on file  . Minutes of Exercise per Session: Not on file  Stress:   . Feeling of Stress : Not on file  Social Connections:   . Frequency of Communication with Friends and Family: Not on file  . Frequency of Social Gatherings with Friends and Family: Not on file  . Attends Religious Services: Not on file  . Active Member of Clubs or Organizations: Not on file  . Attends Archivist Meetings: Not on file  . Marital Status: Not on file     PHYSICAL EXAMINATION:  Vital Signs:  There were no vitals  filed for this visit. There were no vitals filed for this visit.  General: Well-nourished, well-appearing female in no acute distress. unaccompanied HEENT: Head is normocephalic.  Pupils equal and reactive to light. Conjunctivae clear without exudate.  Sclerae anicteric. Oral mucosa is pink, moist.  Lymph: No cervical, supraclavicular, or infraclavicular lymphadenopathy noted on palpation.  Cardiovascular: Regular rate and rhythm.Marland Kitchen Respiratory: Clear to auscultation bilaterally. Chest expansion symmetric; breathing non-labored.  GI: Abdomen soft and round; non-tender, non-distended. Bowel sounds normoactive.  GU: Deferred.  Neuro: No focal deficits. Steady gait.  Psych: Anxious.  Extremities: No edema. Skin: Warm and dry   LABORATORY DATA:  None performed today. Prior labs including prior tumor markers were independently reviewed by myself and discussed with patient.    DIAGNOSTIC IMAGING:  None for this visit.     ASSESSMENT AND PLAN:  Ms.. Monahan is a pleasant 74 y.o. 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 She presents to the Survivorship Clinic for our initial meeting and routine follow-up post-completion of treatment for ovarian  cancer.   1. Stage IC2 ovarian cancer:  Ms. Wainright is continuing to recover from definitive treatment for ovarian cancer. She will follow-up with her medical oncologist, Dr. Janese Banks with history and physical exam per surveillance protocol.  We discussed surveillance protocols per NCCN guidelines including visits with pelvic exams every 2-4 months for the first 2 years then 3-6 months for next 3 years, then annually thereafter.  Surveillance imaging is not generally warranted unless clinically indicated and would be based on symptoms. CA 125 can be monitored but was not elevated at time of diagnosis. Routine pap tests are not recommended. I advised that there is a chance that the cancer may come back for spread to other parts of the body and  that the risk is highest in the first 2 to 3 years after treatment but remains elevated for first 5 years following treatment.  After 5 years it is recommended that she have careful history and physical exam including pelvic exam every 12 months for the rest of her life.  We discussed specific symptoms concerning for recurrent disease such  as vaginal bleeding, rectal bleeding, bloating, weight loss without effort, new and persistent pain, new and persistent fatigue, new leg swelling, new masses (new lumps or bumps), new and persistent cough, new and persistent nausea and vomiting, and any other concerns if she feels something is 'not right', then to contact the clinic.   Genetic testing: We discussed that the national comprehensive cancer network recommends that patients with certain types of ovarian cancer undergo further genetic evaluation for possible hereditary syndromes that may increase the risk of other cancers.  As part of her evaluation she underwent genetic testing including somatic and germline which is outlined above.   We discussed that often during survivorship meetings patients want to know what they can do to prevent their cancer from coming back.  We discussed things such as not smoking, eating well, exercising regularly, and staying at a healthy weight might help, but no one knows for sure, however, we do note that these types of changes can has a positive impact on your health and extend beyond your risk of ovarian and other cancers.  I advised that so far no dietary supplements including vitamins, minerals, and herbal products have been shown to clearly help lower the risk of ovarian cancer from progressing her coming back and that dietary supplements are not regulated like medications in the [place] meaning that they do not have to be proven effective or safe before being sold.  Today, a comprehensive survivorship care plan and treatment summary was reviewed with the patient today  detailing her cancer diagnosis, treatment course, potential late/long-term effects of treatment, appropriate follow-up care with recommendations for the future, and patient education resources.  A copy of this summary, along with a letter will be sent to the patient's primary care provider via mail/fax/In Basket message after today's visit.    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 No falls. Start duloxetine 30 mg x 1 week, if tolerating increase to 60 mg daily. Can reassess in 6-8 weeks.   3. Anxiety & depressed mood- situational anxiety and depression consistent with adjustment disorder. Duloxetine may help as well.   4. Port-a-Cath: Patient had port placed for administration of chemotherapy and remains in place at this time. Discussed the need to flush the port every 6 to 8 weeks or 8-12 weeks during covid pandemic. We also discussed that her medical oncologist will likely consider the removal of the port when she is 1 year post treatment.  Patient verbalizes understanding and agrees to comply with port flush schedule.  5 Potential Common Side Effects of treatment: We discussed some common side effects of treatment may include postsurgical pain, fatigue, lymphedema, menopause, changes in sexuality, pain with intercourse, vaginal dryness, depression/anxiety, and numbness/tingling.  We discussed that if she experiences these to please contact clinic about available therapies that may help improve her symptoms.  6 Leg Swelling: Minimal to pronounced lower leg swelling can occur.  Symptom control with compression hose, lymphedema massage, or specialist physical therapy can improve the symptoms and to please notify clinic if symptoms occur.  7 Sexual intimacy issues: Vaginal dryness, tightening, and scarring at the top of the vagina causing discomfort can occur.  Use of a lubricants, vaginal moisturizers, and dilators can help prevent or improve vaginal symptoms but  if symptoms persist to please contact the clinic for further evaluation and discussion of additional available therapies.  We discussed that even if patient is not interested in sexual activity that use of dilators is  important as this will keep the vagina open and make continued surveillance and pelvic exams possible in the future.  8 Emotional Health: Emotional health is important to your overall wellbeing.  Many women who are treated for cancer expressed concern of body image and intimacy following treatment.  I encouraged her to contact clinic if she is experiencing these feelings as we have multiple treatment modalities to help improve these feelings.   9. Bone health:  Given her age, history of ovarian cancer, and her treatment regimens, she is at risk for bone demineralization. She was encouraged to increase her consumption of foods rich in calcium, as well as increase her weight-bearing activities.  She was given education on specific activities to promote bone health.  I also recommended dose of vitamin D [phone] IU daily as well as calcium 1200 mg daily.  10. Cancer screening: We discussed that people who have had ovarian cancer can still get other cancers and that ovarian cancer survivors at are at a higher risk for getting some other types of cancer. We discussed the importance of continuing appropriate screenings for other medical problems and other cancers. Specifically we discussed recommendations for annual mammograms, colon cancers, and skin cancers. Based on her age and history we discussed specific recommendations and included these in her comprehensive care plan.   11. Health maintenance and wellness promotion: Ms. Blayney was encouraged to consume 5-7 servings of fruits and vegetables per day. We reviewed the "Nutrition Rainbow" handout, as well as the handout "Take Control of Your Health and Reduce Your Cancer Risk" from the [place].  She was also encouraged to engage in  moderate to vigorous exercise for 30 minutes per day most days of the week. We discussed the Care Program, which is designed for cancer survivors to help them become more physically fit after cancer treatments. She was instructed to limit her alcohol consumption and continue to abstain from tobacco. I also recommended sun safety and getting an annual flu vaccine. She has received her covid vaccines.   62 Maintaining a healthy weight: We also discussed the importance of maintaining a healthy body weight as obesity is associated with cancer recurrence and can cause other illnesses including but not limited to diabetes, sleep apnea, and heart disease.   13. Support services/counseling: It is not uncommon for this period of the patient's cancer care trajectory to be one of many emotions and stressors.  We discussed an opportunity for her to participate in the next session of Cancer Transitions support group series designed for patients after they have completed treatment.   Ms. Kubisiak was encouraged to take advantage of our many other support services programs, support groups, and/or counseling in coping with her new life as a cancer survivor after completing anti-cancer treatment.  She was offered support today through active listening and expressive supportive counseling.  She was given information regarding our available services and encouraged to contact me with any questions or for help enrolling in any of our support group/programs.   14 Practical Issues: Cancer diagnosis may impact finances as well as work.  In addition, many people can have issues with insurance after diagnosis of cancer.  We discussed concerns regarding practical issues due to cancer diagnosis. I did recommend keeping health insurance throughout her lifetime as well as copies of her medical records. She was encouraged to follow-up with clinic if any ongoing concerns. I will assist her in completing a cancer policy for her insurance  company.  Dispo:   -Return to cancer center for follow up with Dr. Janese Banks on 03/08/20.  - Follow up with gyn-onc in January 2022.  -She is welcome to return back to the Survivorship Clinic at any time; no additional follow-up needed at this time.  -Consider referral back to survivorship as a long-term survivor for continued surveillance  A total of (70) minutes of face-to-face time was spent with this patient with greater than 50% of that time in counseling and care-coordination.  Beckey Rutter, DNP, AGNP-C [facility] [place]   Note: [place], [place], [place] [phone]

## 2020-04-26 ENCOUNTER — Telehealth: Payer: Self-pay

## 2020-04-26 ENCOUNTER — Other Ambulatory Visit: Payer: Self-pay

## 2020-04-26 ENCOUNTER — Encounter: Payer: Self-pay | Admitting: Gastroenterology

## 2020-04-26 ENCOUNTER — Ambulatory Visit (INDEPENDENT_AMBULATORY_CARE_PROVIDER_SITE_OTHER): Payer: Medicare Other | Admitting: Gastroenterology

## 2020-04-26 VITALS — BP 136/85 | HR 80 | Temp 97.6°F | Ht 63.0 in | Wt 170.0 lb

## 2020-04-26 DIAGNOSIS — K5732 Diverticulitis of large intestine without perforation or abscess without bleeding: Secondary | ICD-10-CM | POA: Diagnosis not present

## 2020-04-26 NOTE — Progress Notes (Signed)
[name], MD [address]  [place]  [place], [place] [address]  Main: [phone]  Fax: [phone]    Gastroenterology Consultation  Referring Provider:     Juline Patch, MD Primary Care Physician:  Juline Patch, MD Primary Gastroenterologist:  Dr. [name] Reason for Consultation: Acute sigmoid diverticulitis        HPI:   Carrie Roberson is a 74 y.o. female referred by Dr. Juline Patch, MD  for consultation & management of acute sigmoid diverticulitis.  Patient presented to [facility] ER on 02/16/2020 secondary to urinary urgency and bladder pressure.  She was treated for overactive bladder by her urologist, however symptoms persisted, therefore went to ER.  Subsequently, underwent CT abdomen and pelvis without contrast which revealed mild sigmoid diverticulitis.  She was treated with Cipro and Flagyl for 1 week.  Her symptoms resolved.  She is currently being treated for sinusitis with antibiotics.  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  She does not have any evidence of recurrence of cancer at this time, closely followed by oncologist, Dr. Janese Banks.   Patient has background history of diarrhea predominant IBS.  She reports that her bowel movements were alternating between diarrhea and constipation when she was on chemo.  She started introducing more fiber in her diet and her IBS symptoms have resolved.  Patient is here to reestablish care as she was last seen in 2019.  She denies any GI symptoms today  NSAIDs: none history of Antiplts/Anticoagulants/Anti thrombotics: none  GI Procedures: In [place], reports not available Ischemic colitis 2007 based on a colonoscopy, unsure of what segment, Had repeat colonoscopy a year later, reportedly normal Last colonoscopy in 2010-2011, a small polyp was removed and she was told she is not due until 10years  Colonoscopy 05/05/2018 - Four 3 to 5 mm polyps in the descending  colon, in the ascending colon and in the cecum, removed with a cold snare. Resected and retrieved. - Normal mucosa in the entire examined colon. Biopsied. -Normal retroflexion in rectum DIAGNOSIS:  A. COLON POLYP, CECUM; COLD SNARE:  - TUBULAR ADENOMA, 2 FRAGMENTS.  - NEGATIVE FOR HIGH-GRADE DYSPLASIA AND MALIGNANCY.   B. COLON POLYP, ASCENDING; COLD SNARE:  - TUBULAR ADENOMA.  - NEGATIVE FOR HIGH-GRADE DYSPLASIA AND MALIGNANCY.   C. COLON; RANDOM COLD BIOPSY:  - COLONIC MUCOSA WITH INTACT CRYPT ARCHITECTURE.  - NEGATIVE FOR MICROSCOPIC COLITIS AND DYSPLASIA.   D. COLON POLYP X 2, DESCENDING; COLD SNARE:  - TUBULAR ADENOMAS, 2 FRAGMENTS.  - NEGATIVE FOR HIGH-GRADE DYSPLASIA AND MALIGNANCY.   Past Medical History:  Diagnosis Date  . Anxiety   . Arthritis   . Family history of breast cancer   . Family history of leukemia   . GERD (gastroesophageal reflux disease)   . History of ischemic colitis 09/02/2017  . Hx of colonic polyp   . Hypertension    H/O-PT TAKEN OFF BY PCP AS OF 2019  . IBS (irritable bowel syndrome)   . Ovarian cancer ([place])   . Ovarian cyst   . Personal history of chemotherapy 2021   6 treatments  . Vaginal atrophy     Past Surgical History:  Procedure Laterality Date  . ABDOMINAL HYSTERECTOMY    . BREAST BIOPSY Left    needle bx/clip-neg  . CERVICAL BIOPSY  W/ LOOP ELECTRODE EXCISION    . CHOLECYSTECTOMY    . Colonoscopoy  2007, 2008, 2011  . COLONOSCOPY  WITH PROPOFOL N/A 05/05/2018   Procedure: COLONOSCOPY WITH PROPOFOL;  Surgeon: Lin Landsman, MD;  Location: [facility] ENDOSCOPY;  Service: Gastroenterology;  Laterality: N/A;  . HYSTERECTOMY ABDOMINAL WITH SALPINGO-OOPHORECTOMY N/A 08/19/2019   Procedure: HYSTERECTOMY ABDOMINAL WITH SALPINGO-OOPHORECTOMY WITH PELVIC WASHINGS PERITONEAL BIOPSIES AND PARA AORTIC LYMPH NODE DISSECTION;  Surgeon: [name], MD;  Location: [facility] ORS;  Service: Gynecology;  Laterality: N/A;  . LEEP    .  OMENTECTOMY N/A 08/19/2019   Procedure: OMENTECTOMY;  Surgeon: [name], MD;  Location: [facility] ORS;  Service: Gynecology;  Laterality: N/A;  . OOPHORECTOMY    . PORTA CATH INSERTION N/A 09/11/2019   Procedure: PORTA CATH INSERTION;  Surgeon: Algernon Huxley, MD;  Location: [place] CV LAB;  Service: Cardiovascular;  Laterality: N/A;  . TUBAL LIGATION  1999    Current Outpatient Medications:  .  acetaminophen (TYLENOL) 500 MG tablet, Take 1,000 mg by mouth every 6 (six) hours as needed (for pain.)., Disp: , Rfl:  .  azithromycin (ZITHROMAX) 250 MG tablet, 2 today then 1 a day for 4 days, Disp: 6 tablet, Rfl: 0 .  fluticasone (FLONASE) 50 MCG/ACT nasal spray, Place 1 spray into both nostrils daily. otc, Disp: , Rfl:  .  guaiFENesin-codeine (ROBITUSSIN AC) 100-10 MG/5ML syrup, Take 5 mLs by mouth 4 (four) times daily as needed for cough., Disp: 118 mL, Rfl: 0 .  lisinopril (ZESTRIL) 5 MG tablet, Take 1 tablet (5 mg total) by mouth daily., Disp: 90 tablet, Rfl: 1 .  loratadine (CLARITIN) 10 MG tablet, Take 10 mg by mouth daily. otc, Disp: , Rfl:  .  LORazepam (ATIVAN) 0.5 MG tablet, Take 1 tablet (0.5 mg total) by mouth every 6 (six) hours as needed (Nausea/vomiting or anxiety)., Disp: 90 tablet, Rfl: 0 .  mirabegron ER (MYRBETRIQ) 50 MG TB24 tablet, Take 1 tablet (50 mg total) by mouth daily., Disp: 30 tablet, Rfl: 3 No current facility-administered medications for this visit.  Facility-Administered Medications Ordered in Other Visits:  .  sodium chloride flush (NS) 0.9 % injection 10 mL, 10 mL, Intravenous, PRN, Sindy Guadeloupe, MD, 10 mL at 01/05/20 0750    Family History  Problem Relation Age of Onset  . Heart failure Mother   . Atrial fibrillation Mother   . Hypertension Mother   . Diabetes Father   . Heart attack Father   . Breast cancer Maternal Aunt 70  . Breast cancer Maternal Grandmother 80  . Breast cancer Cousin        2 mat cousins  . Hypertension Sister   .  Hypertension Sister   . Arthritis Sister   . Leukemia Paternal Aunt   . Leukemia Paternal Uncle   . Brain cancer Paternal Aunt   . Other Brother 13       Drowning accident  . Liver cancer Maternal Uncle   . Alcohol abuse Maternal Uncle   . Prostate cancer Neg Hx   . Kidney cancer Neg Hx   . BRCA 1/2 Neg Hx      Social History   Tobacco Use  . Smoking status: Never Smoker  . Smokeless tobacco: Never Used  . Tobacco comment: smoking cessation materials not required  Vaping Use  . Vaping Use: Never used  Substance Use Topics  . Alcohol use: Yes    Comment: rarely  . Drug use: No    Allergies as of 04/26/2020 - Review Complete 04/26/2020  Allergen Reaction Noted  . Citalopram Nausea And Vomiting 09/30/2017  .  Ivp dye [iodinated diagnostic agents] Hives and Swelling 06/11/2017  . Meloxicam  06/11/2017  . Adhesive [tape] Rash 08/12/2019  . Amlodipine Rash 06/11/2017  . Ampicillin Rash 06/11/2017  . Cefoxitin Rash 06/11/2017  . Estradiol Rash 06/11/2017  . Estropipate Rash 06/11/2017  . Olmesartan medoxomil-hctz Rash 06/11/2017  . Sertraline Rash 06/11/2017  . Sulfa antibiotics Rash 06/11/2017    Review of Systems:    All systems reviewed and negative except where noted in HPI.   Physical Exam:  BP 136/85 (BP Location: Left Arm, Patient Position: Sitting, Cuff Size: Normal)   Pulse 80   Temp 97.6 F (36.4 C) (Oral)   Ht 5' 3" (1.6 m)   Wt 170 lb (77.1 kg)   BMI 30.11 kg/m  No LMP recorded. Patient has had a hysterectomy.  General:   Alert,  Well-developed, well-nourished, pleasant and cooperative in NAD Head:  Normocephalic and atraumatic. Eyes:  Sclera clear, no icterus.   Conjunctiva pink. Ears:  Normal auditory acuity. Nose:  No deformity, discharge, or lesions. Mouth:  No deformity or lesions,oropharynx pink & moist. Neck:  Supple; no masses or thyromegaly. Lungs:  Respirations even and unlabored.  Clear throughout to auscultation.   No wheezes, crackles,  or rhonchi. No acute distress. Heart:  Regular rate and rhythm; no murmurs, clicks, rubs, or gallops. Abdomen:  Normal bowel sounds. Soft, non-tender and non-distended without masses, hepatosplenomegaly or hernias noted.  No guarding or rebound tenderness.   Rectal: Not performed Msk:  Symmetrical without gross deformities. Good, equal movement & strength bilaterally. Pulses:  Normal pulses noted. Extremities:  No clubbing or edema.  No cyanosis. Neurologic:  Alert and oriented x3;  grossly normal neurologically. Skin:  Intact without significant lesions or rashes. No jaundice. Psych:  Alert and cooperative. Normal mood and affect.  Imaging Studies: No abdominal imaging done  Assessment and Plan:   Carrie Roberson is a 74 y.o. Caucasian female with no significant past medical history, history of ischemic colitis, diarrhea predominant IBS, work-up was unremarkable.  Recent episode of uncomplicated, acute mild sigmoid diverticulitis s/p treatment with 1 week of Cipro and Flagyl Patient is currently asymptomatic  Colon cancer surveillance: -Colonoscopy 04/2018 revealed multiple tubular adenomas, less than 1 cm.  Recommend surveillance colonoscopy in 04/2021   Follow up as needed  [name], MD

## 2020-04-26 NOTE — Telephone Encounter (Signed)
Patient called, she had originally signed up for the CARE program but stated she changed her mind and has now declined CARE services. Will cancel all upcoming appointments per patient request. Informed patient if she changes her mind again to call us back and get a new referral. Patient understood.

## 2020-05-05 ENCOUNTER — Other Ambulatory Visit: Payer: Self-pay

## 2020-05-05 ENCOUNTER — Inpatient Hospital Stay: Payer: Medicare Other | Attending: Nurse Practitioner | Admitting: Nurse Practitioner

## 2020-05-05 VITALS — BP 151/74 | HR 70 | Temp 98.6°F | Resp 17 | Wt 172.0 lb

## 2020-05-05 DIAGNOSIS — G620 Drug-induced polyneuropathy: Secondary | ICD-10-CM

## 2020-05-05 DIAGNOSIS — Z9221 Personal history of antineoplastic chemotherapy: Secondary | ICD-10-CM | POA: Diagnosis not present

## 2020-05-05 DIAGNOSIS — Z8543 Personal history of malignant neoplasm of ovary: Secondary | ICD-10-CM | POA: Diagnosis not present

## 2020-05-05 DIAGNOSIS — T451X5A Adverse effect of antineoplastic and immunosuppressive drugs, initial encounter: Secondary | ICD-10-CM | POA: Diagnosis not present

## 2020-05-05 DIAGNOSIS — Z79899 Other long term (current) drug therapy: Secondary | ICD-10-CM | POA: Insufficient documentation

## 2020-05-05 NOTE — Progress Notes (Signed)
Pt in [facility] to follow up on neuropathy. Pt went to 1st chiropractor visit.She has ongoing neuropathy. Has days where she feels depressed. She procrastinates.

## 2020-05-05 NOTE — Progress Notes (Signed)
Symptom Management [place]  [phone] [phone]  Patient Care Team: Juline Patch, MD as PCP - General (Family Medicine) Lin Landsman, MD as Consulting Physician (Gastroenterology) Clent Jacks, RN as Oncology Nurse Navigator Mellody Drown, MD as Referring Physician (Obstetrics) Sindy Guadeloupe, MD as Consulting Physician (Oncology) [name], MD as Consulting Physician (Obstetrics and Gynecology)   Name of the patient: Carrie Roberson  [id number]  07-30-1945   Date of visit: 05/05/20  Diagnosis-ovarian cancer  Chief complaint/ Reason for visit-peripheral neuropathy  Heme/Onc history:  Oncology History  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[place])  08/19/2019 Initial Diagnosis   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[place])   09/07/2019 Cancer Staging   Staging form: Ovary, Fallopian Tube, and Primary Peritoneal Carcinoma, AJCC 8th Edition - Pathologic stage from 09/07/2019: FIGO Stage IC2, calculated as Stage IC (pT1c2, pN0, cM0) - Signed by Sindy Guadeloupe, MD on 09/07/2019   09/15/2019 -  Chemotherapy   The patient had dexamethasone (DECADRON) 4 MG tablet, 8 mg, Oral, Daily, 1 of 1 cycle, Start date: 11/16/2019, End date: 02/18/2020 palonosetron (ALOXI) injection 0.25 mg, 0.25 mg, Intravenous,  Once, 6 of 6 cycles Administration: 0.25 mg (09/15/2019), 0.25 mg (10/06/2019), 0.25 mg (10/27/2019), 0.25 mg (11/17/2019), 0.25 mg (12/08/2019), 0.25 mg (01/05/2020) pegfilgrastim (NEULASTA ONPRO KIT) injection 6 mg, 6 mg, Subcutaneous, Once, 6 of 6 cycles Administration: 6 mg (09/15/2019), 6 mg (10/06/2019), 6 mg (10/27/2019), 6 mg (11/17/2019), 6 mg (12/08/2019), 6 mg (01/05/2020) CARBOplatin (PARAPLATIN) 350 mg in sodium chloride 0.9 % 250 mL chemo infusion, 350 mg (100 % of original dose 354.8 mg), Intravenous,  Once, 6 of 6 cycles Dose modification:   (original dose 354.8 mg, Cycle 1),   (original dose 354.8 mg, Cycle 6),   (original  dose 354.8 mg, Cycle 6) Administration: 350 mg (09/15/2019), 350 mg (10/06/2019), 350 mg (10/27/2019), 350 mg (11/17/2019), 350 mg (12/08/2019), 350 mg (01/05/2020) fosaprepitant (EMEND) 150 mg in sodium chloride 0.9 % 145 mL IVPB, 150 mg, Intravenous,  Once, 6 of 6 cycles Administration: 150 mg (09/15/2019), 150 mg (10/06/2019), 150 mg (10/27/2019), 150 mg (11/17/2019), 150 mg (12/08/2019), 150 mg (01/05/2020) PACLitaxel (TAXOL) 336 mg in sodium chloride 0.9 % 500 mL chemo infusion (> 54m/m2), 175 mg/m2 = 336 mg, Intravenous,  Once, 6 of 6 cycles Administration: 336 mg (09/15/2019), 336 mg (10/06/2019), 336 mg (10/27/2019), 336 mg (11/17/2019), 336 mg (12/08/2019), 336 mg (01/05/2020)  for chemotherapy treatment.    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  The CancerNext gene panel offered by [employer] sequencing and rearrangement analysis for the following 34 genes:   APC, ATM, BARD1, BMPR1A, BRCA1, BRCA2, BRIP1, CDH1, CDK4, CDKN2A, CHEK2, DICER1, HOXB13, EPCAM, GREM1, MLH1, MRE11A, MSH2, MSH6, MUTYH, NBN, NF1, PALB2, PMS2, POLD1, POLE, PTEN, RAD50, RAD51C, RAD51D, SMAD4, SMARCA4, STK11, and TP53.  The report date is 10/29/2019.  TumorHRD testing was negative.  This suggests that the patient's tumor is not more susceptible to PARP inhibitor.     Interval history-patient returns to symptom management clinic to follow-up for chemo-induced peripheral neuropathy.  In the interim, she is seen occupational therapy and was referred to care program.  She has stopped Cymbalta.  Stopped gabapentin due to side effect.  Says moods feels slightly better but neuropathy is about the same.  No interval falls.  By her report she is clumsy at baseline which is no better no worse.  Prefers  to not exercise if possible.  Neuropathic symptoms in left and right foot described as burning, tingling, pins-and-needles, worse at night.  She has a history of diverticulitis.  Continues  treatment from urology for overactive bladder which seems to be helping.  Recently saw Dr. Marius Ditch for history of IBS.  Continues to feel depressed.  No dizziness or weakness today.  No interval fevers or illness.  Denies easy bleeding or bruising.  Appetite is good and she denies weight loss.  Denies chest pain or shortness of breath.  Denies nausea, vomiting, constipation, diarrhea.  Urinary complaints as above are improved.  No further specific complaints today.  Review of systems- Review of Systems  Constitutional: Negative for chills, fever, malaise/fatigue and weight loss.  HENT: Negative for hearing loss, nosebleeds, sore throat and tinnitus.   Eyes: Negative for blurred vision and double vision.  Respiratory: Negative for cough, hemoptysis, shortness of breath and wheezing.   Cardiovascular: Negative for chest pain, palpitations and leg swelling.  Gastrointestinal: Negative for abdominal pain, blood in stool, constipation, diarrhea, melena, nausea and vomiting.  Genitourinary: Negative for dysuria and urgency.  Musculoskeletal: Negative for back pain, falls, joint pain and myalgias.  Skin: Negative for itching and rash.  Neurological: Positive for tingling. Negative for dizziness, sensory change, loss of consciousness, weakness and headaches.  Endo/Heme/Allergies: Negative for environmental allergies. Does not bruise/bleed easily.  Psychiatric/Behavioral: Positive for depression. The patient is not nervous/anxious and does not have insomnia.      Current treatment-surveillance  Allergies  Allergen Reactions   Citalopram Nausea And Vomiting   Ivp Dye [Iodinated Diagnostic Agents] Hives and Swelling    hives   Meloxicam     unknown   Adhesive [Tape] Rash   Amlodipine Rash   Ampicillin Rash    Did it involve swelling of the face/tongue/throat, SOB, or low BP? No Did it involve sudden or severe rash/hives, skin peeling, or any reaction on the inside of your mouth or nose?  Yes Did you need to seek medical attention at a hospital or doctor's office? Yes When did it last happen? 10 + years If all above answers are NO, may proceed with cephalosporin use.   Cefoxitin Rash   Estradiol Rash   Estropipate Rash   Olmesartan Medoxomil-Hctz Rash   Sertraline Rash        Sulfa Antibiotics Rash    Past Medical History:  Diagnosis Date   Anxiety    Arthritis    Family history of breast cancer    Family history of leukemia    GERD (gastroesophageal reflux disease)    History of ischemic colitis 09/02/2017   Hx of colonic polyp    Hypertension    H/O-PT TAKEN OFF BY PCP AS OF 2019   IBS (irritable bowel syndrome)    Ovarian cancer ([place])    Ovarian cyst    Personal history of chemotherapy 2021   6 treatments   Vaginal atrophy     Past Surgical History:  Procedure Laterality Date   ABDOMINAL HYSTERECTOMY     BREAST BIOPSY Left    needle bx/clip-neg   CERVICAL BIOPSY  W/ LOOP ELECTRODE EXCISION     CHOLECYSTECTOMY     Colonoscopoy  2007, 2008, 2011   COLONOSCOPY WITH PROPOFOL N/A 05/05/2018   Procedure: COLONOSCOPY WITH PROPOFOL;  Surgeon: Lin Landsman, MD;  Location: [facility] ENDOSCOPY;  Service: Gastroenterology;  Laterality: N/A;   HYSTERECTOMY ABDOMINAL WITH SALPINGO-OOPHORECTOMY N/A 08/19/2019   Procedure: HYSTERECTOMY ABDOMINAL WITH SALPINGO-OOPHORECTOMY WITH  PELVIC WASHINGS PERITONEAL BIOPSIES AND PARA AORTIC LYMPH NODE DISSECTION;  Surgeon: [name], MD;  Location: [facility] ORS;  Service: Gynecology;  Laterality: N/A;   LEEP     OMENTECTOMY N/A 08/19/2019   Procedure: OMENTECTOMY;  Surgeon: [name], MD;  Location: [facility] ORS;  Service: Gynecology;  Laterality: N/A;   OOPHORECTOMY     PORTA CATH INSERTION N/A 09/11/2019   Procedure: PORTA CATH INSERTION;  Surgeon: Algernon Huxley, MD;  Location: [place] CV LAB;  Service: Cardiovascular;  Laterality: N/A;   TUBAL LIGATION  1999    Social  History   Socioeconomic History   Marital status: Single    Spouse name: Not on file   Number of children: 0   Years of education: Not on file   Highest education level: Associate degree: academic program  Occupational History   Occupation: Retired  Tobacco Use   Smoking status: Never Smoker   Smokeless tobacco: Never Used   Tobacco comment: smoking cessation materials not required  Vaping Use   Vaping Use: Never used  Substance and Sexual Activity   Alcohol use: Yes    Comment: rarely   Drug use: No   Sexual activity: Never    Birth control/protection: Post-menopausal  Other Topics Concern   Not on file  Social History Narrative   Not on file   Social Determinants of Health   Financial Resource Strain:    Difficulty of Paying Living Expenses: Not on file  Food Insecurity:    Worried About Charity fundraiser in the Last Year: Not on file   [place] in the Last Year: Not on file  Transportation Needs:    Lack of Transportation (Medical): Not on file   Lack of Transportation (Non-Medical): Not on file  Physical Activity:    Days of Exercise per Week: Not on file   Minutes of Exercise per Session: Not on file  Stress:    Feeling of Stress : Not on file  Social Connections:    Frequency of Communication with Friends and Family: Not on file   Frequency of Social Gatherings with Friends and Family: Not on file   Attends Religious Services: Not on file   Active Member of Clubs or Organizations: Not on file   Attends Archivist Meetings: Not on file   Marital Status: Not on file  Intimate Partner Violence:    Fear of Current or Ex-Partner: Not on file   Emotionally Abused: Not on file   Physically Abused: Not on file   Sexually Abused: Not on file    Family History  Problem Relation Age of Onset   Heart failure Mother    Atrial fibrillation Mother    Hypertension Mother    Diabetes Father    Heart attack  Father    Breast cancer Maternal Aunt 6   Breast cancer Maternal Grandmother 47   Breast cancer Cousin        2 mat cousins   Hypertension Sister    Hypertension Sister    Arthritis Sister    Leukemia Paternal Aunt    Leukemia Paternal Uncle    Brain cancer Paternal Aunt    Other Brother 3       Drowning accident   Liver cancer Maternal Uncle    Alcohol abuse Maternal Uncle    Prostate cancer Neg Hx    Kidney cancer Neg Hx    BRCA 1/2 Neg Hx      Current  Outpatient Medications:    acetaminophen (TYLENOL) 500 MG tablet, Take 1,000 mg by mouth every 6 (six) hours as needed (for pain.)., Disp: , Rfl:    azithromycin (ZITHROMAX) 250 MG tablet, 2 today then 1 a day for 4 days, Disp: 6 tablet, Rfl: 0   fluticasone (FLONASE) 50 MCG/ACT nasal spray, Place 1 spray into both nostrils daily. otc, Disp: , Rfl:    guaiFENesin-codeine (ROBITUSSIN AC) 100-10 MG/5ML syrup, Take 5 mLs by mouth 4 (four) times daily as needed for cough., Disp: 118 mL, Rfl: 0   lisinopril (ZESTRIL) 5 MG tablet, Take 1 tablet (5 mg total) by mouth daily., Disp: 90 tablet, Rfl: 1   loratadine (CLARITIN) 10 MG tablet, Take 10 mg by mouth daily. otc, Disp: , Rfl:    LORazepam (ATIVAN) 0.5 MG tablet, Take 1 tablet (0.5 mg total) by mouth every 6 (six) hours as needed (Nausea/vomiting or anxiety)., Disp: 90 tablet, Rfl: 0   mirabegron ER (MYRBETRIQ) 50 MG TB24 tablet, Take 1 tablet (50 mg total) by mouth daily., Disp: 30 tablet, Rfl: 3 No current facility-administered medications for this visit.  Facility-Administered Medications Ordered in Other Visits:    sodium chloride flush (NS) 0.9 % injection 10 mL, 10 mL, Intravenous, PRN, Sindy Guadeloupe, MD, 10 mL at 01/05/20 0750  Physical exam:  Vitals:   05/05/20 1006  BP: (!) 151/74  Pulse: 70  Resp: 17  Temp: 98.6 F (37 C)  TempSrc: Tympanic  SpO2: 97%  Weight: 172 lb (78 kg)   Physical Exam Constitutional:      Appearance: Normal  appearance.  HENT:     Head: Normocephalic and atraumatic.  Eyes:     General: No scleral icterus.    Conjunctiva/sclera: Conjunctivae normal.  Pulmonary:     Effort: Pulmonary effort is normal.     Breath sounds: No wheezing.  Skin:    General: Skin is warm and dry.  Neurological:     Mental Status: She is alert and oriented to person, place, and time.     Motor: No weakness.     Gait: Gait normal.  Psychiatric:        Mood and Affect: Mood normal.        Behavior: Behavior normal.      CMP Latest Ref Rng & Units 03/08/2020  Glucose 70 - 99 mg/dL 123(H)  BUN 8 - 23 mg/dL 11  Creatinine 0.44 - 1.00 mg/dL 0.65  Sodium 135 - 145 mmol/L 133(L)  Potassium 3.5 - 5.1 mmol/L 3.8  Chloride 98 - 111 mmol/L 101  CO2 22 - 32 mmol/L 26  Calcium 8.9 - 10.3 mg/dL 10.1  Total Protein 6.5 - 8.1 g/dL 6.8  Total Bilirubin 0.3 - 1.2 mg/dL 0.6  Alkaline Phos 38 - 126 U/L 111  AST 15 - 41 U/L 18  ALT 0 - 44 U/L 21   CBC Latest Ref Rng & Units 03/08/2020  WBC 4.0 - 10.5 K/uL 3.8(L)  Hemoglobin 12.0 - 15.0 g/dL 14.6  Hematocrit 36 - 46 % 40.9  Platelets 150 - 400 K/uL 263    No images are attached to the encounter.  No results found.  Assessment and plan- Patient is a 74 y.o. 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  Clinically she continues to do well and is  asymptomatic of cancer recurrence.  She has stopped Cymbalta and was intolerant to gabapentin.  She has received 1 treatment of acupuncture and has not yet noticed an effect.  She was seen by occupational therapy but is unsure if she will follow-up.  Lengthy discussion encouraging her to participate in care program and occupational therapy.   Discussed follow up prn but patient would like f/u in 4  weeks which is fine. She can returnt o clinic sooner if needed.    Visit Diagnosis 1. Chemotherapy-induced peripheral neuropathy [facility])     Patient expressed understanding and was in agreement with this plan. She also understands that She can call clinic at any time with any questions, concerns, or complaints.   Thank you for allowing me to participate in the care of this very pleasant patient.   Beckey Rutter, DNP, AGNP-C [facility]

## 2020-05-09 ENCOUNTER — Other Ambulatory Visit: Payer: Self-pay

## 2020-05-09 ENCOUNTER — Ambulatory Visit (INDEPENDENT_AMBULATORY_CARE_PROVIDER_SITE_OTHER): Payer: Medicare Other | Admitting: Physician Assistant

## 2020-05-09 ENCOUNTER — Encounter: Payer: Self-pay | Admitting: Physician Assistant

## 2020-05-09 VITALS — BP 126/75 | HR 83 | Ht 63.0 in | Wt 170.6 lb

## 2020-05-09 DIAGNOSIS — R350 Frequency of micturition: Secondary | ICD-10-CM | POA: Diagnosis not present

## 2020-05-09 LAB — BLADDER SCAN AMB NON-IMAGING: Scan Result: 57

## 2020-05-09 MED ORDER — MIRABEGRON ER 50 MG PO TB24: 50.0000 mg | ORAL_TABLET | Freq: Every day | ORAL | 11 refills | Status: DC

## 2020-05-09 NOTE — Progress Notes (Signed)
05/09/2020 11:47 AM   Carrie Roberson [birth year] [id number]  CC: Chief Complaint  Patient presents with  . Urinary Frequency    HPI: Carrie Roberson is a 74 y.o. 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  I saw her in clinic most recently on 03/08/2020 with reports of symptomatic improvement on Myrbetriq x1 month.  Today patient reports resolution of urinary leakage on Myrbetriq.  She is having nocturia x2-3, x4 if she has fluids late in the evening.  Overall, she is very pleased with her urinary symptoms and wishes to continue the medication.  PVR 74m.  PMH: Past Medical History:  Diagnosis Date  . Anxiety   . Arthritis   . Family history of breast cancer   . Family history of leukemia   . GERD (gastroesophageal reflux disease)   . History of ischemic colitis 09/02/2017  . Hx of colonic polyp   . Hypertension    H/O-PT TAKEN OFF BY PCP AS OF 2019  . IBS (irritable bowel syndrome)   . [place]   . Ovarian cyst   . Personal history of chemotherapy 2021   6 treatments  . Vaginal atrophy     Surgical History: Past Surgical History:  Procedure Laterality Date  . ABDOMINAL HYSTERECTOMY    . BREAST BIOPSY Left    needle bx/clip-neg  . CERVICAL BIOPSY  W/ LOOP ELECTRODE EXCISION    . CHOLECYSTECTOMY    . Colonoscopoy  2007, 2008, 2011  . COLONOSCOPY WITH PROPOFOL N/A 05/05/2018   Procedure: COLONOSCOPY WITH PROPOFOL;  Surgeon: VLin Landsman MD;  [facility];  Service: Gastroenterology;  Laterality: N/A;  . HYSTERECTOMY ABDOMINAL WITH SALPINGO-OOPHORECTOMY N/A 08/19/2019   Procedure: HYSTERECTOMY ABDOMINAL WITH SALPINGO-OOPHORECTOMY WITH PELVIC WASHINGS PERITONEAL BIOPSIES AND PARA AORTIC LYMPH NODE DISSECTION;  Surgeon: SMalachy Mood MD;  Location: [facility] ORS;  Service:  Gynecology;  Laterality: N/A;  . LEEP    . OMENTECTOMY N/A 08/19/2019   Procedure: OMENTECTOMY;  Surgeon: SMalachy Mood MD;  Location: [facility] ORS;  Service: Gynecology;  Laterality: N/A;  . OOPHORECTOMY    . PORTA CATH INSERTION N/A 09/11/2019   Procedure: PORTA CATH INSERTION;  Surgeon: DAlgernon Huxley MD;  [facility];  Service: Cardiovascular;  Laterality: N/A;  . TUBAL LIGATION  1999    Home Medications:  Allergies as of 05/09/2020      Reactions   Citalopram Nausea And Vomiting   Ivp Dye [iodinated Diagnostic Agents] Hives, Swelling   hives   Meloxicam    unknown   Adhesive [tape] Rash   Amlodipine Rash   Ampicillin Rash   Did it involve swelling of the face/tongue/throat, SOB, or low BP? No Did it involve sudden or severe rash/hives, skin peeling, or any reaction on the inside of your mouth or nose? Yes Did you need to seek medical attention at a hospital or doctor's office? Yes When did it last happen? 10 + years If all above answers are "NO", may proceed with cephalosporin use.   Cefoxitin Rash   Estradiol Rash   Estropipate Rash   Olmesartan Medoxomil-hctz Rash   Sertraline Rash      Sulfa Antibiotics Rash      Medication List       Accurate as of May 09, 2020 11:47 AM. If you have any questions, ask your nurse  or doctor.        STOP taking these medications   azithromycin 250 MG tablet Commonly known as: ZITHROMAX Stopped by: Debroah Loop, PA-C     TAKE these medications   acetaminophen 500 MG tablet Commonly known as: TYLENOL Take 1,000 mg by mouth every 6 (six) hours as needed (for pain.).   fluticasone 50 MCG/ACT nasal spray Commonly known as: FLONASE Place 1 spray into both nostrils daily. otc   gabapentin 300 MG capsule Commonly known as: NEURONTIN Take 300 mg by mouth 3 (three) times daily.   guaiFENesin-codeine 100-10 MG/5ML syrup Commonly known as: ROBITUSSIN AC Take 5 mLs by mouth 4 (four) times  daily as needed for cough.   lisinopril 5 MG tablet Commonly known as: ZESTRIL Take 1 tablet (5 mg total) by mouth daily.   loratadine 10 MG tablet Commonly known as: CLARITIN Take 10 mg by mouth daily. otc   LORazepam 0.5 MG tablet Commonly known as: Ativan Take 1 tablet (0.5 mg total) by mouth every 6 (six) hours as needed (Nausea/vomiting or anxiety).   mirabegron ER 50 MG Tb24 tablet Commonly known as: MYRBETRIQ Take 1 tablet (50 mg total) by mouth daily.       Allergies:  Allergies  Allergen Reactions  . Citalopram Nausea And Vomiting  . Ivp Dye [Iodinated Diagnostic Agents] Hives and Swelling    hives  . Meloxicam     unknown  . Adhesive [Tape] Rash  . Amlodipine Rash  . Ampicillin Rash    Did it involve swelling of the face/tongue/throat, SOB, or low BP? No Did it involve sudden or severe rash/hives, skin peeling, or any reaction on the inside of your mouth or nose? Yes Did you need to seek medical attention at a hospital or doctor's office? Yes When did it last happen? 10 + years If all above answers are "NO", may proceed with cephalosporin use.  . Cefoxitin Rash  . Estradiol Rash  . Estropipate Rash  . Olmesartan Medoxomil-Hctz Rash  . Sertraline Rash       . Sulfa Antibiotics Rash    Family History: Family History  Problem Relation Age of Onset  . Heart failure Mother   . Atrial fibrillation Mother   . Hypertension Mother   . Diabetes Father   . Heart attack Father   . Breast cancer Maternal Aunt 70  . Breast cancer Maternal Grandmother 80  . Breast cancer Cousin        2 mat cousins  . Hypertension Sister   . Hypertension Sister   . Arthritis Sister   . Leukemia Paternal Aunt   . Leukemia Paternal Uncle   . Brain cancer Paternal Aunt   . Other Brother 13       Drowning accident  . Liver cancer Maternal Uncle   . Alcohol abuse Maternal Uncle   . Prostate cancer Neg Hx   . Kidney cancer Neg Hx   . BRCA 1/2 Neg Hx     Social  History:   reports that she has never smoked. She has never used smokeless tobacco. She reports current alcohol use. She reports that she does not use drugs.  Physical Exam: BP 126/75   Pulse 83   Ht 5' 3"  (1.6 m)   Wt 170 lb 9.6 oz (77.4 kg)   BMI 30.22 kg/m   Constitutional:  Alert and oriented, no acute distress, nontoxic appearing HEENT: [place], AT Cardiovascular: No clubbing, cyanosis, or edema Respiratory: Normal respiratory effort, no increased  work of breathing Skin: No rashes, bruises or suspicious lesions Neurologic: Grossly intact, no focal deficits, moving all 4 extremities Psychiatric: Normal mood and affect  Laboratory Data: Results for orders placed or performed in visit on 05/09/20  Bladder Scan (Post Void Residual) in office  Result Value Ref Range   Scan Result 57    Assessment & Plan:   1. Urinary frequency Significantly improved on Myrbetriq 50 mg daily.  Patient pleased with progress.  We will plan to continue this medication with annual follow-up. - Bladder Scan (Post Void Residual) in office - mirabegron ER (MYRBETRIQ) 50 MG TB24 tablet; Take 1 tablet (50 mg total) by mouth daily.  Dispense: 30 tablet; Refill: 11  Return in about 1 year (around 05/09/2021) for Symptom recheck with PVR.  Debroah Loop, PA-C  [facility] [address] [place], [place] [address] [phone]

## 2020-05-13 NOTE — Progress Notes (Signed)
Symptom Management [place]  [phone] [phone]  Patient Care Team: Juline Patch, MD as PCP - General (Family Medicine) Lin Landsman, MD as Consulting Physician (Gastroenterology) Clent Jacks, RN as Oncology Nurse Navigator Mellody Drown, MD as Referring Physician (Obstetrics) Sindy Guadeloupe, MD as Consulting Physician (Oncology) [name], MD as Consulting Physician (Obstetrics and Gynecology)   Name of the patient: Carrie Roberson  [id number]  1945/10/03   Date of visit: 05/13/20  Diagnosis-high-grade serous ovarian cancer  Chief complaint/ Reason for visit-peripheral neuropathy  Heme/Onc history:  Oncology History  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[place])  08/19/2019 Initial Diagnosis   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[place])   09/07/2019 Cancer Staging   Staging form: Ovary, Fallopian Tube, and Primary Peritoneal Carcinoma, AJCC 8th Edition - Pathologic stage from 09/07/2019: FIGO Stage IC2, calculated as Stage IC (pT1c2, pN0, cM0) - Signed by Sindy Guadeloupe, MD on 09/07/2019   09/15/2019 -  Chemotherapy   The patient had dexamethasone (DECADRON) 4 MG tablet, 8 mg, Oral, Daily, 1 of 1 cycle, Start date: 11/16/2019, End date: 02/18/2020 palonosetron (ALOXI) injection 0.25 mg, 0.25 mg, Intravenous,  Once, 6 of 6 cycles Administration: 0.25 mg (09/15/2019), 0.25 mg (10/06/2019), 0.25 mg (10/27/2019), 0.25 mg (11/17/2019), 0.25 mg (12/08/2019), 0.25 mg (01/05/2020) pegfilgrastim (NEULASTA ONPRO KIT) injection 6 mg, 6 mg, Subcutaneous, Once, 6 of 6 cycles Administration: 6 mg (09/15/2019), 6 mg (10/06/2019), 6 mg (10/27/2019), 6 mg (11/17/2019), 6 mg (12/08/2019), 6 mg (01/05/2020) CARBOplatin (PARAPLATIN) 350 mg in sodium chloride 0.9 % 250 mL chemo infusion, 350 mg (100 % of original dose 354.8 mg), Intravenous,  Once, 6 of 6 cycles Dose modification:   (original dose 354.8 mg, Cycle 1),   (original dose 354.8 mg, Cycle  6),   (original dose 354.8 mg, Cycle 6) Administration: 350 mg (09/15/2019), 350 mg (10/06/2019), 350 mg (10/27/2019), 350 mg (11/17/2019), 350 mg (12/08/2019), 350 mg (01/05/2020) fosaprepitant (EMEND) 150 mg in sodium chloride 0.9 % 145 mL IVPB, 150 mg, Intravenous,  Once, 6 of 6 cycles Administration: 150 mg (09/15/2019), 150 mg (10/06/2019), 150 mg (10/27/2019), 150 mg (11/17/2019), 150 mg (12/08/2019), 150 mg (01/05/2020) PACLitaxel (TAXOL) 336 mg in sodium chloride 0.9 % 500 mL chemo infusion (> 64m/m2), 175 mg/m2 = 336 mg, Intravenous,  Once, 6 of 6 cycles Administration: 336 mg (09/15/2019), 336 mg (10/06/2019), 336 mg (10/27/2019), 336 mg (11/17/2019), 336 mg (12/08/2019), 336 mg (01/05/2020)  for chemotherapy treatment.    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  The CancerNext gene panel offered by [employer] sequencing and rearrangement analysis for the following 34 genes:   APC, ATM, BARD1, BMPR1A, BRCA1, BRCA2, BRIP1, CDH1, CDK4, CDKN2A, CHEK2, DICER1, HOXB13, EPCAM, GREM1, MLH1, MRE11A, MSH2, MSH6, MUTYH, NBN, NF1, PALB2, PMS2, POLD1, POLE, PTEN, RAD50, RAD51C, RAD51D, SMAD4, SMARCA4, STK11, and TP53.  The report date is 10/29/2019.  TumorHRD testing was negative.  This suggests that the patient's tumor is not more susceptible to PARP inhibitor.     Interval history-74year old patient with history of stage I C2 high-grade serous ovarian cancer presents to symptom management clinic for complaints of peripheral neuropathy. She describes symptoms of numbness, burning, tingling and cramping.  Onset of symptoms was gradual, starting about towards end of chemotherapy.  Symptoms are currently of moderate severity.  Symptoms occur intermittently and most bothersome at night and last hours.  The patient denies lancinating pain and hypersensitivity.  Symptoms are symmetric.  She also describes autonomic symptoms of  alternating diarrhea and  constipation. She denies  orthostasis, bloating, nausea, early satiety, dry eyes, dry mouth and blurred vision.  Previous treatment has included cymbalta, which has not improved symptoms. No history of diabetes. Denies frequent urination or frequent thirst.  ECOG FS:1 - Symptomatic but completely ambulatory  Review of systems- Review of Systems  Constitutional: Positive for malaise/fatigue. Negative for chills, fever and weight loss.  HENT: Negative for hearing loss, nosebleeds, sore throat and tinnitus.   Eyes: Negative for blurred vision and double vision.  Respiratory: Negative for cough, hemoptysis, shortness of breath and wheezing.   Cardiovascular: Negative for chest pain, palpitations and leg swelling.  Gastrointestinal: Negative for abdominal pain, blood in stool, constipation, diarrhea, melena, nausea and vomiting.  Genitourinary: Negative for dysuria and urgency.  Musculoskeletal: Positive for falls. Negative for back pain, joint pain and myalgias.  Skin: Negative for itching and rash.  Neurological: Positive for tingling. Negative for dizziness, sensory change, loss of consciousness, weakness and headaches.  Endo/Heme/Allergies: Negative for environmental allergies. Does not bruise/bleed easily.  Psychiatric/Behavioral: Negative for depression. The patient is nervous/anxious and has insomnia.      Current treatment-surveillance  Allergies  Allergen Reactions  . Citalopram Nausea And Vomiting  . Ivp Dye [Iodinated Diagnostic Agents] Hives and Swelling    hives  . Meloxicam     unknown  . Adhesive [Tape] Rash  . Amlodipine Rash  . Ampicillin Rash    Did it involve swelling of the face/tongue/throat, SOB, or low BP? No Did it involve sudden or severe rash/hives, skin peeling, or any reaction on the inside of your mouth or nose? Yes Did you need to seek medical attention at a hospital or doctor's office? Yes When did it last happen? 10 + years If all above answers are  "NO", may proceed with cephalosporin use.  . Cefoxitin Rash  . Estradiol Rash  . Estropipate Rash  . Olmesartan Medoxomil-Hctz Rash  . Sertraline Rash       . Sulfa Antibiotics Rash    Past Medical History:  Diagnosis Date  . Anxiety   . Arthritis   . Family history of breast cancer   . Family history of leukemia   . GERD (gastroesophageal reflux disease)   . History of ischemic colitis 09/02/2017  . Hx of colonic polyp   . Hypertension    H/O-PT TAKEN OFF BY PCP AS OF 2019  . IBS (irritable bowel syndrome)   . Ovarian cancer ([place])   . Ovarian cyst   . Personal history of chemotherapy 2021   6 treatments  . Vaginal atrophy     Past Surgical History:  Procedure Laterality Date  . ABDOMINAL HYSTERECTOMY    . BREAST BIOPSY Left    needle bx/clip-neg  . CERVICAL BIOPSY  W/ LOOP ELECTRODE EXCISION    . CHOLECYSTECTOMY    . Colonoscopoy  2007, 2008, 2011  . COLONOSCOPY WITH PROPOFOL N/A 05/05/2018   Procedure: COLONOSCOPY WITH PROPOFOL;  Surgeon: Lin Landsman, MD;  [facility];  Service: Gastroenterology;  Laterality: N/A;  . HYSTERECTOMY ABDOMINAL WITH SALPINGO-OOPHORECTOMY N/A 08/19/2019   Procedure: HYSTERECTOMY ABDOMINAL WITH SALPINGO-OOPHORECTOMY WITH PELVIC WASHINGS PERITONEAL BIOPSIES AND PARA AORTIC LYMPH NODE DISSECTION;  Surgeon: [name], MD;  Location: [facility] ORS;  Service: Gynecology;  Laterality: N/A;  . LEEP    . OMENTECTOMY N/A 08/19/2019   Procedure: OMENTECTOMY;  Surgeon: [name], MD;  Location: [facility] ORS;  Service: Gynecology;  Laterality: N/A;  . OOPHORECTOMY    . PORTA CATH INSERTION N/A 09/11/2019   Procedure: PORTA CATH INSERTION;  Surgeon: Algernon Huxley, MD;  Location: [facility];  Service: Cardiovascular;  Laterality: N/A;  . TUBAL LIGATION  1999    Social History   Socioeconomic History  . Marital status: Single    Spouse name: Not on file  . Number of children: 0  . Years of education: Not on file    . Highest education level: Associate degree: academic program  Occupational History  . Occupation: Retired  Tobacco Use  . Smoking status: Never Smoker  . Smokeless tobacco: Never Used  . Tobacco comment: smoking cessation materials not required  Vaping Use  . Vaping Use: Never used  Substance and Sexual Activity  . Alcohol use: Yes    Comment: rarely  . Drug use: No  . Sexual activity: Never    Birth control/protection: Post-menopausal  Other Topics Concern  . Not on file  Social History Narrative  . Not on file   Social Determinants of Health   Financial Resource Strain:   . Difficulty of Paying Living Expenses: Not on file  Food Insecurity:   . Worried About Charity fundraiser in the Last Year: Not on file  . Ran Out of Food in the Last Year: Not on file  Transportation Needs:   . Lack of Transportation (Medical): Not on file  . Lack of Transportation (Non-Medical): Not on file  Physical Activity:   . Days of Exercise per Week: Not on file  . Minutes of Exercise per Session: Not on file  Stress:   . Feeling of Stress : Not on file  Social Connections:   . Frequency of Communication with Friends and Family: Not on file  . Frequency of Social Gatherings with Friends and Family: Not on file  . Attends Religious Services: Not on file  . Active Member of Clubs or Organizations: Not on file  . Attends Archivist Meetings: Not on file  . Marital Status: Not on file  Intimate Partner Violence:   . Fear of Current or Ex-Partner: Not on file  . Emotionally Abused: Not on file  . Physically Abused: Not on file  . Sexually Abused: Not on file    Family History  Problem Relation Age of Onset  . Heart failure Mother   . Atrial fibrillation Mother   . Hypertension Mother   . Diabetes Father   . Heart attack Father   . Breast cancer Maternal Aunt 70  . Breast cancer Maternal Grandmother 80  . Breast cancer Cousin        2 mat cousins  . Hypertension Sister    . Hypertension Sister   . Arthritis Sister   . Leukemia Paternal Aunt   . Leukemia Paternal Uncle   . Brain cancer Paternal Aunt   . Other Brother 13       Drowning accident  . Liver cancer Maternal Uncle   . Alcohol abuse Maternal Uncle   . Prostate cancer Neg Hx   . Kidney cancer Neg Hx   . BRCA 1/2 Neg Hx      Current Outpatient Medications:  .  acetaminophen (TYLENOL) 500 MG tablet, Take 1,000 mg by mouth every 6 (six) hours as needed (for pain.)., Disp: , Rfl:  .  lisinopril (ZESTRIL) 5 MG tablet, Take 1 tablet (5 mg total) by mouth daily., Disp: 90 tablet, Rfl: 1 .  LORazepam (ATIVAN) 0.5 MG tablet, Take 1 tablet (0.5 mg total) by mouth every 6 (six) hours as needed (Nausea/vomiting or anxiety)., Disp: 90 tablet, Rfl: 0 .  fluticasone (FLONASE) 50 MCG/ACT nasal spray, Place 1 spray into both nostrils daily. otc, Disp: , Rfl:  .  gabapentin (NEURONTIN) 300 MG capsule, Take 300 mg by mouth 3 (three) times daily., Disp: , Rfl:  .  guaiFENesin-codeine (ROBITUSSIN AC) 100-10 MG/5ML syrup, Take 5 mLs by mouth 4 (four) times daily as needed for cough., Disp: 118 mL, Rfl: 0 .  loratadine (CLARITIN) 10 MG tablet, Take 10 mg by mouth daily. otc, Disp: , Rfl:  .  mirabegron ER (MYRBETRIQ) 50 MG TB24 tablet, Take 1 tablet (50 mg total) by mouth daily., Disp: 30 tablet, Rfl: 11 No current facility-administered medications for this visit.  Facility-Administered Medications Ordered in Other Visits:  .  sodium chloride flush (NS) 0.9 % injection 10 mL, 10 mL, Intravenous, PRN, Sindy Guadeloupe, MD, 10 mL at 01/05/20 0750  Physical exam:  Vitals:   04/07/20 1042  BP: (!) 149/77  Pulse: 79  Resp: 17  Temp: (!) 97.3 F (36.3 C)  TempSrc: Tympanic  Weight: 170 lb (77.1 kg)   Physical Exam Constitutional:      General: She is not in acute distress.    Appearance: She is not ill-appearing.  HENT:     Head: Normocephalic.  Eyes:     General: No scleral icterus. Musculoskeletal:         General: No deformity.     Right lower leg: No edema.     Left lower leg: No edema.  Skin:    General: Skin is warm and dry.  Neurological:     Mental Status: She is alert and oriented to person, place, and time.     Motor: No weakness.     Gait: Gait normal.  Psychiatric:        Mood and Affect: Mood normal.        Behavior: Behavior normal.        Thought Content: Thought content normal.      CMP Latest Ref Rng & Units 03/08/2020  Glucose 70 - 99 mg/dL 123(H)  BUN 8 - 23 mg/dL 11  Creatinine 0.44 - 1.00 mg/dL 0.65  Sodium 135 - 145 mmol/L 133(L)  Potassium 3.5 - 5.1 mmol/L 3.8  Chloride 98 - 111 mmol/L 101  CO2 22 - 32 mmol/L 26  Calcium 8.9 - 10.3 mg/dL 10.1  Total Protein 6.5 - 8.1 g/dL 6.8  Total Bilirubin 0.3 - 1.2 mg/dL 0.6  Alkaline Phos 38 - 126 U/L 111  AST 15 - 41 U/L 18  ALT 0 - 44 U/L 21   CBC Latest Ref Rng & Units 03/08/2020  WBC 4.0 - 10.5 K/uL 3.8(L)  Hemoglobin 12.0 - 15.0 g/dL 14.6  Hematocrit 36 - 46 % 40.9  Platelets 150 - 400 K/uL 263    No images are attached to the encounter.  No results found.  Assessment and plan- Patient is a 74 y.o. female diagnosed with FIGO stage I C2 high-grade serous ovarian cancer s/p TAH-BSO followed by 6 cycles of adjuvant carbo-Taxol chemotherapy who presents to symptom management clinic for follow-up for chemotherapy-induced peripheral neuropathy. She has not noticed improvement with Cymbalta which she was taking for both anxiety and neuropathy. Also complains of poor balance and recent fall. Unclear if related to neuropathy. Will refer to occupational therapy, Rosalyn Gess for evaluation. Encouraged participation in  the care program if recommended by [facility]. We will stop Cymbalta and do trial of gabapentin 300 mg 3 times a day as needed for neuropathic pain. Side effects discussed and patient would like to try medication.  Return to clinic in 4 weeks for follow-up   Visit Diagnosis 1. Chemotherapy-induced  peripheral neuropathy [facility])     Patient expressed understanding and was in agreement with this plan. She also understands that She can call clinic at any time with any questions, concerns, or complaints.   Thank you for allowing me to participate in the care of this very pleasant patient.   Beckey Rutter, DNP, AGNP-C [facility]

## 2020-05-18 ENCOUNTER — Inpatient Hospital Stay: Payer: Medicare Other | Admitting: Occupational Therapy

## 2020-05-19 ENCOUNTER — Other Ambulatory Visit: Payer: Self-pay | Admitting: Nurse Practitioner

## 2020-05-19 DIAGNOSIS — C561 Malignant neoplasm of right ovary: Secondary | ICD-10-CM

## 2020-06-07 ENCOUNTER — Other Ambulatory Visit: Payer: Self-pay

## 2020-06-07 ENCOUNTER — Ambulatory Visit (INDEPENDENT_AMBULATORY_CARE_PROVIDER_SITE_OTHER): Payer: Medicare Other

## 2020-06-07 DIAGNOSIS — Z23 Encounter for immunization: Secondary | ICD-10-CM | POA: Diagnosis not present

## 2020-06-19 ENCOUNTER — Other Ambulatory Visit: Payer: Self-pay | Admitting: *Deleted

## 2020-06-19 DIAGNOSIS — C561 Malignant neoplasm of right ovary: Secondary | ICD-10-CM

## 2020-06-21 ENCOUNTER — Inpatient Hospital Stay (HOSPITAL_BASED_OUTPATIENT_CLINIC_OR_DEPARTMENT_OTHER): Payer: Medicare Other | Admitting: Oncology

## 2020-06-21 ENCOUNTER — Encounter: Payer: Self-pay | Admitting: Oncology

## 2020-06-21 ENCOUNTER — Inpatient Hospital Stay: Payer: Medicare Other | Attending: Oncology

## 2020-06-21 DIAGNOSIS — Z833 Family history of diabetes mellitus: Secondary | ICD-10-CM | POA: Diagnosis not present

## 2020-06-21 DIAGNOSIS — Z8261 Family history of arthritis: Secondary | ICD-10-CM | POA: Diagnosis not present

## 2020-06-21 DIAGNOSIS — Z8543 Personal history of malignant neoplasm of ovary: Secondary | ICD-10-CM | POA: Diagnosis not present

## 2020-06-21 DIAGNOSIS — Z90722 Acquired absence of ovaries, bilateral: Secondary | ICD-10-CM | POA: Diagnosis not present

## 2020-06-21 DIAGNOSIS — Z79899 Other long term (current) drug therapy: Secondary | ICD-10-CM | POA: Insufficient documentation

## 2020-06-21 DIAGNOSIS — G620 Drug-induced polyneuropathy: Secondary | ICD-10-CM

## 2020-06-21 DIAGNOSIS — Z90710 Acquired absence of both cervix and uterus: Secondary | ICD-10-CM | POA: Insufficient documentation

## 2020-06-21 DIAGNOSIS — Z9079 Acquired absence of other genital organ(s): Secondary | ICD-10-CM | POA: Diagnosis not present

## 2020-06-21 DIAGNOSIS — F419 Anxiety disorder, unspecified: Secondary | ICD-10-CM | POA: Diagnosis not present

## 2020-06-21 DIAGNOSIS — C561 Malignant neoplasm of right ovary: Secondary | ICD-10-CM

## 2020-06-21 DIAGNOSIS — Z08 Encounter for follow-up examination after completed treatment for malignant neoplasm: Secondary | ICD-10-CM | POA: Diagnosis not present

## 2020-06-21 DIAGNOSIS — Z803 Family history of malignant neoplasm of breast: Secondary | ICD-10-CM | POA: Insufficient documentation

## 2020-06-21 DIAGNOSIS — Z800 Family history of malignant neoplasm of digestive organs: Secondary | ICD-10-CM | POA: Diagnosis not present

## 2020-06-21 DIAGNOSIS — Z8249 Family history of ischemic heart disease and other diseases of the circulatory system: Secondary | ICD-10-CM | POA: Diagnosis not present

## 2020-06-21 DIAGNOSIS — T451X5A Adverse effect of antineoplastic and immunosuppressive drugs, initial encounter: Secondary | ICD-10-CM

## 2020-06-21 DIAGNOSIS — Z9221 Personal history of antineoplastic chemotherapy: Secondary | ICD-10-CM | POA: Insufficient documentation

## 2020-06-21 DIAGNOSIS — Z95828 Presence of other vascular implants and grafts: Secondary | ICD-10-CM

## 2020-06-21 DIAGNOSIS — C569 Malignant neoplasm of unspecified ovary: Secondary | ICD-10-CM | POA: Diagnosis not present

## 2020-06-21 LAB — CBC WITH DIFFERENTIAL/PLATELET
Abs Immature Granulocytes: 0.01 10*3/uL (ref 0.00–0.07)
Basophils Absolute: 0 10*3/uL (ref 0.0–0.1)
Basophils Relative: 1 %
Eosinophils Absolute: 0.3 10*3/uL (ref 0.0–0.5)
Eosinophils Relative: 8 %
HCT: 41.5 % (ref 36.0–46.0)
Hemoglobin: 13.9 g/dL (ref 12.0–15.0)
Immature Granulocytes: 0 %
Lymphocytes Relative: 43 %
Lymphs Abs: 1.8 10*3/uL (ref 0.7–4.0)
MCH: 31 pg (ref 26.0–34.0)
MCHC: 33.5 g/dL (ref 30.0–36.0)
MCV: 92.6 fL (ref 80.0–100.0)
Monocytes Absolute: 0.4 10*3/uL (ref 0.1–1.0)
Monocytes Relative: 10 %
Neutro Abs: 1.5 10*3/uL — ABNORMAL LOW (ref 1.7–7.7)
Neutrophils Relative %: 38 %
Platelets: 235 10*3/uL (ref 150–400)
RBC: 4.48 MIL/uL (ref 3.87–5.11)
RDW: 13.1 % (ref 11.5–15.5)
WBC: 4.1 10*3/uL (ref 4.0–10.5)
nRBC: 0 % (ref 0.0–0.2)

## 2020-06-21 LAB — COMPREHENSIVE METABOLIC PANEL
ALT: 15 U/L (ref 0–44)
AST: 16 U/L (ref 15–41)
Albumin: 4 g/dL (ref 3.5–5.0)
Alkaline Phosphatase: 82 U/L (ref 38–126)
Anion gap: 8 (ref 5–15)
BUN: 15 mg/dL (ref 8–23)
CO2: 25 mmol/L (ref 22–32)
Calcium: 10.4 mg/dL — ABNORMAL HIGH (ref 8.9–10.3)
Chloride: 104 mmol/L (ref 98–111)
Creatinine, Ser: 0.6 mg/dL (ref 0.44–1.00)
GFR, Estimated: >60 mL/min (ref >60)
Glucose, Bld: 97 mg/dL (ref 70–99)
Potassium: 4.1 mmol/L (ref 3.5–5.1)
Sodium: 137 mmol/L (ref 135–145)
Total Bilirubin: 0.6 mg/dL (ref 0.3–1.2)
Total Protein: 6.8 g/dL (ref 6.5–8.1)

## 2020-06-21 MED ORDER — HEPARIN SOD (PORK) LOCK FLUSH 100 UNIT/ML IV SOLN
500.0000 [IU] | Freq: Once | INTRAVENOUS | Status: AC
  Administered 2020-06-21: 500 [IU] via INTRAVENOUS
  Filled 2020-06-21: qty 5

## 2020-06-21 MED ORDER — SODIUM CHLORIDE 0.9% FLUSH
10.0000 mL | Freq: Once | INTRAVENOUS | Status: AC
  Administered 2020-06-21: 10 mL via INTRAVENOUS
  Filled 2020-06-21: qty 10

## 2020-06-21 MED ORDER — HEPARIN SOD (PORK) LOCK FLUSH 100 UNIT/ML IV SOLN
INTRAVENOUS | Status: AC
  Filled 2020-06-21: qty 5

## 2020-06-21 NOTE — Progress Notes (Signed)
Pt has no concerns, still with neuropathy and 2 meds she tried gave her side effects that she did not like-unsteady ambulation. She did acupuncture but stopped because the pt's did not wear masks in waiting room and neither the doctor when he saw her.

## 2020-06-22 LAB — CA 125: Cancer Antigen (CA) 125: 8.8 U/mL (ref 0.0–38.1)

## 2020-06-22 MED ORDER — PREGABALIN 75 MG PO CAPS: 75.0000 mg | ORAL_CAPSULE | Freq: Two times a day (BID) | ORAL | 3 refills | Status: DC

## 2020-06-22 NOTE — Progress Notes (Signed)
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 This was followed by an MR pelvis with and without contrast in December 2020 which showed a large cystic mass arising from the right ovary measuring 1.8 cm. This was followed by TAH/BSO with peritoneal biopsies with pelvic/aortic lymph node dissection pelvic washings and omentectomy with Dr. Fransisca Connors and Dr. Georgianne Fick on 08/19/2019. 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 Angiolymphatic invasion is present. Bilateral fallopian tubes were negative for atypia and malignancy. Peritoneal stone excision negative for malignancy. Uterus with cervix negative for atypia and malignancy. Bladder, right gutter negative for malignancy. 5 lymph nodes were sampled and were negative for malignancy. Pelvic washings were also negative for malignancy. Pathologic staging FIGO1 C2  Patient was seen by Dr. Fransisca Connors postoperatively and recommendation was to proceed with adjuvant chemotherapy carbotaxol for 6 cycles.  BRCA2 p.[id number] VUS found on the cancerNext germline panel. The CancerNext gene panel offered by  [employer] includes sequencing and rearrangement analysis for the following 34 genes: APC, ATM, BARD1, BMPR1A, BRCA1, BRCA2, BRIP1, CDH1, CDK4, CDKN2A, CHEK2, DICER1, HOXB13, EPCAM, GREM1, MLH1, MRE11A, MSH2, MSH6, MUTYH, NBN, NF1, PALB2, PMS2, POLD1, POLE, PTEN, RAD50, RAD51C, RAD51D, SMAD4, SMARCA4, STK11, and TP53. The report date is 10/29/2019.  TumorHRD testing was negative.      Interval history-patient mainly has neuropathy in her bilateral feet and fingertips.  She tried Cymbalta in the past which did not help her.  She could not tolerate gabapentin due to gait imbalance following that.  Currently she is not on any medications for her neuropathy.  Sometimes the neuropathy bothers her at night and makes it difficult for her to go to sleep.  She is also going to be trying an alternative chiropractor to see if it would help her.  ECOG PS- 1 Pain scale- 3  Review of systems- Review of Systems  Constitutional: Negative for chills, fever, malaise/fatigue and weight loss.  HENT: Negative for congestion, ear discharge and nosebleeds.   Eyes: Negative for blurred vision.  Respiratory: Negative for cough, hemoptysis, sputum production, shortness of breath and wheezing.   Cardiovascular: Negative for chest pain, palpitations, orthopnea and claudication.  Gastrointestinal: Negative for abdominal pain, blood in stool, constipation, diarrhea, heartburn, melena, nausea and vomiting.  Genitourinary: Negative for dysuria, flank pain, frequency, hematuria and urgency.  Musculoskeletal: Negative for back pain, joint pain and myalgias.  Skin: Negative for rash.  Neurological: Positive for sensory change (Peripheral neuropathy). Negative for dizziness, tingling, focal weakness, seizures, weakness and headaches.  Endo/Heme/Allergies: Does not bruise/bleed easily.  Psychiatric/Behavioral: Negative for depression and suicidal ideas. The patient does not have insomnia.      Allergies  Allergen  Reactions  . Citalopram Nausea And Vomiting  . Ivp Dye [Iodinated Diagnostic Agents] Hives and Swelling  hives  . Meloxicam     unknown  . Adhesive [Tape] Rash  . Amlodipine Rash  . Ampicillin Rash    Did it involve swelling of the face/tongue/throat, SOB, or low BP? No Did it involve sudden or severe rash/hives, skin peeling, or any reaction on the inside of your mouth or nose? Yes Did you need to seek medical attention at a hospital or doctor's office? Yes When did it last happen? 10 + years If all above answers are "NO", may proceed with cephalosporin use.  . Cefoxitin Rash  . Estradiol Rash  . Estropipate Rash  . Olmesartan Medoxomil-Hctz Rash  . Sertraline Rash       . Sulfa Antibiotics Rash     Past Medical History:  Diagnosis Date  . Anxiety   . Arthritis   . Family history of breast cancer   . Family history of leukemia   . GERD (gastroesophageal reflux disease)   . History of ischemic colitis 09/02/2017  . Hx of colonic polyp   . Hypertension    H/O-PT TAKEN OFF BY PCP AS OF 2019  . IBS (irritable bowel syndrome)   . Ovarian cancer ([place])   . Ovarian cyst   . Personal history of chemotherapy 2021   6 treatments  . Vaginal atrophy      Past Surgical History:  Procedure Laterality Date  . ABDOMINAL HYSTERECTOMY    . BREAST BIOPSY Left    needle bx/clip-neg  . CERVICAL BIOPSY  W/ LOOP ELECTRODE EXCISION    . CHOLECYSTECTOMY    . Colonoscopoy  2007, 2008, 2011  . COLONOSCOPY WITH PROPOFOL N/A 05/05/2018   Procedure: COLONOSCOPY WITH PROPOFOL;  Surgeon: Lin Landsman, MD;  Location: [facility] ENDOSCOPY;  Service: Gastroenterology;  Laterality: N/A;  . HYSTERECTOMY ABDOMINAL WITH SALPINGO-OOPHORECTOMY N/A 08/19/2019   Procedure: HYSTERECTOMY ABDOMINAL WITH SALPINGO-OOPHORECTOMY WITH PELVIC WASHINGS PERITONEAL BIOPSIES AND PARA AORTIC LYMPH NODE DISSECTION;  Surgeon: [name], MD;  Location: [facility] ORS;  Service: Gynecology;  Laterality: N/A;  .  LEEP    . OMENTECTOMY N/A 08/19/2019   Procedure: OMENTECTOMY;  Surgeon: [name], MD;  Location: [facility] ORS;  Service: Gynecology;  Laterality: N/A;  . OOPHORECTOMY    . PORTA CATH INSERTION N/A 09/11/2019   Procedure: PORTA CATH INSERTION;  Surgeon: Algernon Huxley, MD;  Location: [place] [facility];  Service: Cardiovascular;  Laterality: N/A;  . TUBAL LIGATION  1999    Social History   Socioeconomic History  . Marital status: Single    Spouse name: Not on file  . Number of children: 0  . Years of education: Not on file  . Highest education level: Associate degree: academic program  Occupational History  . Occupation: Retired  Tobacco Use  . Smoking status: Never Smoker  . Smokeless tobacco: Never Used  . Tobacco comment: 2bd hand smoke from father  Vaping Use  . Vaping Use: Never used  Substance and Sexual Activity  . Alcohol use: Yes    Comment: rarely  . Drug use: No  . Sexual activity: Never    Birth control/protection: Post-menopausal  Other Topics Concern  . Not on file  Social History Narrative  . Not on file   Social Determinants of Health   Financial Resource Strain:   . Difficulty of Paying Living Expenses: Not on file  Food Insecurity:   . Worried About Charity fundraiser in the Last Year: Not on file  . Ran Out of Food in the Last  Year: Not on file  Transportation Needs:   . Lack of Transportation (Medical): Not on file  . Lack of Transportation (Non-Medical): Not on file  Physical Activity:   . Days of Exercise per Week: Not on file  . Minutes of Exercise per Session: Not on file  Stress:   . Feeling of Stress : Not on file  Social Connections:   . Frequency of Communication with Friends and Family: Not on file  . Frequency of Social Gatherings with Friends and Family: Not on file  . Attends Religious Services: Not on file  . Active Member of Clubs or Organizations: Not on file  . Attends Archivist Meetings: Not on file  .  Marital Status: Not on file  Intimate Partner Violence:   . Fear of Current or Ex-Partner: Not on file  . Emotionally Abused: Not on file  . Physically Abused: Not on file  . Sexually Abused: Not on file    Family History  Problem Relation Age of Onset  . Heart failure Mother   . Atrial fibrillation Mother   . Hypertension Mother   . Diabetes Father   . Heart attack Father   . Breast cancer Maternal Aunt 70  . Breast cancer Maternal Grandmother 80  . Breast cancer Cousin        2 mat cousins  . Hypertension Sister   . Diabetes Sister   . Blindness Sister   . Hypertension Sister   . Arthritis Sister   . Leukemia Paternal Aunt   . Leukemia Paternal Uncle   . Brain cancer Paternal Aunt   . Other Brother 13       Drowning accident  . Liver cancer Maternal Uncle   . Alcohol abuse Maternal Uncle   . Prostate cancer Neg Hx   . Kidney cancer Neg Hx   . BRCA 1/2 Neg Hx      Current Outpatient Medications:  .  acetaminophen (TYLENOL) 500 MG tablet, Take 1,000 mg by mouth every 6 (six) hours as needed (for pain.)., Disp: , Rfl:  .  fluticasone (FLONASE) 50 MCG/ACT nasal spray, Place 1 spray into both nostrils daily. otc, Disp: , Rfl:  .  guaiFENesin (MUCINEX) 600 MG 12 hr tablet, Take by mouth 2 (two) times daily., Disp: , Rfl:  .  guaiFENesin-codeine (ROBITUSSIN AC) 100-10 MG/5ML syrup, Take 5 mLs by mouth 4 (four) times daily as needed for cough., Disp: 118 mL, Rfl: 0 .  lisinopril (ZESTRIL) 5 MG tablet, Take 1 tablet (5 mg total) by mouth daily., Disp: 90 tablet, Rfl: 1 .  loratadine (CLARITIN) 10 MG tablet, Take 10 mg by mouth daily. otc, Disp: , Rfl:  .  LORazepam (ATIVAN) 0.5 MG tablet, TAKE 1 TABLET (0.5 MG TOTAL) BY MOUTH EVERY 6 (SIX) HOURS AS NEEDED (NAUSEA/VOMITING OR ANXIETY)., Disp: 90 tablet, Rfl: 0 .  mirabegron ER (MYRBETRIQ) 50 MG TB24 tablet, Take 1 tablet (50 mg total) by mouth daily., Disp: 30 tablet, Rfl: 11 .  pregabalin (LYRICA) 75 MG capsule, Take 1 capsule  (75 mg total) by mouth 2 (two) times daily., Disp: 60 capsule, Rfl: 3 No current facility-administered medications for this visit.  Facility-Administered Medications Ordered in Other Visits:  .  sodium chloride flush (NS) 0.9 % injection 10 mL, 10 mL, Intravenous, PRN, Sindy Guadeloupe, MD, 10 mL at 01/05/20 0750  Physical exam: There were no vitals filed for this visit. Physical Exam Constitutional:      General: She is not  in acute distress. Eyes:     Pupils: Pupils are equal, round, and reactive to light.  Cardiovascular:     Rate and Rhythm: Normal rate and regular rhythm.     Heart sounds: Normal heart sounds.  Pulmonary:     Effort: Pulmonary effort is normal.     Breath sounds: Normal breath sounds.  Abdominal:     General: Bowel sounds are normal.     Palpations: Abdomen is soft.  Skin:    General: Skin is warm and dry.  Neurological:     Mental Status: She is alert and oriented to person, place, and time.      CMP Latest Ref Rng & Units 06/21/2020  Glucose 70 - 99 mg/dL 97  BUN 8 - 23 mg/dL 15  Creatinine 0.44 - 1.00 mg/dL 0.60  Sodium 135 - 145 mmol/L 137  Potassium 3.5 - 5.1 mmol/L 4.1  Chloride 98 - 111 mmol/L 104  CO2 22 - 32 mmol/L 25  Calcium 8.9 - 10.3 mg/dL 10.4(H)  Total Protein 6.5 - 8.1 g/dL 6.8  Total Bilirubin 0.3 - 1.2 mg/dL 0.6  Alkaline Phos 38 - 126 U/L 82  AST 15 - 41 U/L 16  ALT 0 - 44 U/L 15   CBC Latest Ref Rng & Units 06/21/2020  WBC 4.0 - 10.5 K/uL 4.1  Hemoglobin 12.0 - 15.0 g/dL 13.9  Hematocrit 36 - 46 % 41.5  Platelets 150 - 400 K/uL 235     Assessment and plan- Patient is a 74 y.o. 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  She is here for routine follow-up of ovarian cancer after completing 6 cycles of adjuvant carbotaxol chemotherapy  Clinically patient is doing well and reports no concerning abdominal bloating or abdominal pain.  Appetite and weight have remained stable.  CA-125  is normal.  She has an appointment with GYN oncology in January 2022.  And I will see her back in April with labs CBC with differential CMP and CA-125.  Patient has mild baseline neutropenia even prior to start of chemotherapy which is essentially remained stable.  Chemo-induced peripheral neuropathy: Cymbalta has not helped her and gabapentin caused intolerable side effects.  She is willing to consider Lyrica and I will send her a prescription for 75 mg Lyrica twice a day.  She will let us know if she has any problems tolerating the medication.   Visit Diagnosis 1. Encounter for follow-up surveillance of ovarian cancer   2. Chemotherapy-induced peripheral neuropathy ([place])      Dr. Randa Evens, MD, MPH [facility] [phone] 06/22/2020 11:14 AM

## 2020-07-27 ENCOUNTER — Inpatient Hospital Stay: Payer: Medicare Other | Attending: Obstetrics and Gynecology | Admitting: Obstetrics and Gynecology

## 2020-07-27 VITALS — BP 140/64 | HR 76 | Temp 98.0°F | Resp 20 | Wt 178.6 lb

## 2020-07-27 DIAGNOSIS — Z8543 Personal history of malignant neoplasm of ovary: Secondary | ICD-10-CM

## 2020-07-27 DIAGNOSIS — E538 Deficiency of other specified B group vitamins: Secondary | ICD-10-CM | POA: Insufficient documentation

## 2020-07-27 DIAGNOSIS — M1990 Unspecified osteoarthritis, unspecified site: Secondary | ICD-10-CM | POA: Diagnosis not present

## 2020-07-27 DIAGNOSIS — C561 Malignant neoplasm of right ovary: Secondary | ICD-10-CM

## 2020-07-27 DIAGNOSIS — K219 Gastro-esophageal reflux disease without esophagitis: Secondary | ICD-10-CM | POA: Insufficient documentation

## 2020-07-27 DIAGNOSIS — Z08 Encounter for follow-up examination after completed treatment for malignant neoplasm: Secondary | ICD-10-CM | POA: Diagnosis not present

## 2020-07-27 DIAGNOSIS — Z90722 Acquired absence of ovaries, bilateral: Secondary | ICD-10-CM | POA: Diagnosis not present

## 2020-07-27 DIAGNOSIS — Z90710 Acquired absence of both cervix and uterus: Secondary | ICD-10-CM | POA: Insufficient documentation

## 2020-07-27 DIAGNOSIS — Z9221 Personal history of antineoplastic chemotherapy: Secondary | ICD-10-CM | POA: Diagnosis not present

## 2020-07-27 DIAGNOSIS — I10 Essential (primary) hypertension: Secondary | ICD-10-CM | POA: Insufficient documentation

## 2020-07-27 DIAGNOSIS — Z79899 Other long term (current) drug therapy: Secondary | ICD-10-CM | POA: Diagnosis not present

## 2020-07-27 DIAGNOSIS — F419 Anxiety disorder, unspecified: Secondary | ICD-10-CM | POA: Insufficient documentation

## 2020-07-27 DIAGNOSIS — E669 Obesity, unspecified: Secondary | ICD-10-CM | POA: Diagnosis not present

## 2020-07-27 DIAGNOSIS — Z6831 Body mass index (BMI) 31.0-31.9, adult: Secondary | ICD-10-CM | POA: Insufficient documentation

## 2020-07-27 NOTE — Progress Notes (Signed)
[place]  Telephone:([phone] [phone]  Patient Care Team: Juline Patch, MD as PCP - General (Family Medicine) Lin Landsman, MD as Consulting Physician (Gastroenterology) Clent Jacks, RN as Oncology Nurse Navigator Mellody Drown, MD as Referring Physician (Obstetrics) Sindy Guadeloupe, MD as Consulting Physician (Oncology) [name], MD as Consulting Physician (Obstetrics and Gynecology)   Name of the patient: Carrie Roberson  [id number]  10-25-1945   Date of visit: 07/27/2020  Gynecologic Oncology Interval Visit   Referring Provider: Dr. 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 G0P0 female seen in consultation from Dr. Georgianne Fick for ovarian cancer in right ovarian cyst   TAH-BSO 1/21 and carbo-taxol chemotherapy completed 6/21, who returns to clinic for ongoing surveillance.  No new complaints.  CA 125 has been followed though not significantly elevated at diagnosis:  06/21/20 8.8 At diagnosis:  10.3  Gynecologic Oncology History:  [place] concerning a midline pelvic mass.  Initial presentation was prompted by frequent urination and bladder pessure. PVR was checked and noted to be normal but bladder scan showed residual fluid collection.  TVUS was ordered and revealed a 10.2 x 9.2 x 6.3 cm midline cystic pelvic mass. Symptoms initially started in August of 2020. She had previously been followed asymptomatic 1.42 x 1.58cm simple left ovarian cyst on 11/18/2017.    Appearance on 06/26/2019 ultrasound was notable simple cyst, normal doppler flow and no free fluid. The patient endorses associated symptoms of pelvic pressure.  The patient denies associated symptoms of  early satiety, weight gain, weight loss, night sweats, vaginal bleeding and nausea.  There is not a notable family history of ovarian cancer, uterine  cancer, breast cancer, or colon cancer.  06/07/2018 Pelvic US Findings:  The uterus is anteverted and measures 6.8x3x2.7cm. Echo texture is homogenous without evidence of focal masses. The Endometrium measures 4.19 mm.  Right Ovary measures 4.7 cm3. It is normal in appearance. Left Ovary measures 5.2 cm3. It has a hypoechoic cyst = 1.4x1.75m Survey of the adnexa demonstrates no adnexal masses. There is no free fluid in the cul de sac. IMPRESSION:  1. Left Ovary measures 5.2 cm3. It has a hypoechoic cyst = 1.4x1.43m(possibility of hemorrhagic cyst vs others) 2. Cervical canal appears irregular d/t LEEP procedure  06/26/2019 Abdominal USKoreaFINDINGS: No ascites is demonstrated. There is a cystic midline pelvic mass which lies superior to the bladder, measuring 10.2 x 9.2 x 6.3 cm. IMPRESSION: 1. No ascites. 2. Large cystic mass in the pelvis, not demonstrated on previous ultrasounds. This is suspicious for ovarian cystic neoplasm. Further evaluation recommended with dedicated pelvic ultrasound and/or abdominopelvic CT.  07/01/2019 MRI Reproductive: Uterus: Measures 4.3 x 2.0 by 5.9 cm. Scattered nabothian cysts identified. No mass. Right ovary measures the 10.5 x 6.8 by 9.0 cm (volume = 340 cm^3). There is a large cystic mass arising from the right ovary which has a maximum dimension of 10.8 cm. An eccentric, enhancing mural nodule is identified measuring 2.6 x 1.2 cm, image 50/10. No internal septation. Left ovary measures 2.9 x 2.1 by 1.8 cm. (Volume = 5.7 cm^3). This contains several small cyst which measure up to 1.4 cm.  06/29/2019 labs Cancer Antigen (CA) 125 0.0 - 38.1 U/mL 10.3   Comment: Roche [employer] Electrochemiluminescence Immunoassay (ECLIA)  Values obtained with different assay methods or kits cannot be  used  interchangeably. Results cannot be interpreted as absolute  evidence of the presence or absence of malignant disease.   HE4 0.0 - 96.9 pmol/L 68.8   Comment:  [employer] Electrochemiluminescence Immunoassay (ECLIA)  Values obtained with different assay methods or kits cannot be  used interchangeably. Results cannot be interpreted as absolute  evidence of the presence or absence of malignant disease.       Postmenopausal ROMA See below 1.21    ROMA calculator 12%  Of note prior LEEP 2002 (negative pathology per patient) last pap 07/26/2014 NILM.   On 08/19/19 she under TAH-BSO with with peritoneal biopsies, pelvic/aortic lymph node dissection, pelvic washings, and omentectomy with Dr. Fransisca Connors and Dr. [facility]ne [facility]ick at [facility].  Uneventful recovery.    Pathology:  DIAGNOSIS:  A. FALLOPIAN TUBE AND OVARY, RIGHT; RIGHT SALPINGO-OOPHORECTOMY:  - HIGH-GRADE SEROUS CARCINOMA OF THE OVARY.  - ANGIOLYMPHATIC INVASION IS PRESENT.  - SEE CANCER SUMMARY BELOW.  - FALLOPIAN TUBE WITH BENIGN PARATUBAL CYSTS.   B. FALLOPIAN TUBE AND OVARY, LEFT; LEFT SALPINGO-OOPHORECTOMY:  - OVARY WITH SEROUS CYSTADENOMA MEASURING 1.5 CM AND CYSTIC FOLLICLES.  - FALLOPIAN TUBE WITH BENIGN PARATUBAL CYSTS.  - NEGATIVE FOR ATYPIA AND MALIGNANCY.   C. PERITONEAL STONE; EXCISION:  - STONE, 1.7 CM (GROSS ONLY).   D. UTERUS WITH CERVIX; TOTAL HYSTERECTOMY:  - CERVIX WITH ENDOCERVICAL POLYP MEASURING 0.5 CM, NABOTHIAN CYSTS, AND  TUNNEL CLUSTERS.  - INACTIVE ENDOMETRIUM WITH SMALL ENDOMETRIAL POLYP.  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  - NEGATIVE FOR ATYPIA AND MALIGNANCY.   E. RIGHT GUTTER; BIOPSY:  - BENIGN MESOTHELIAL LINED FIBROADIPOSE TISSUE.  - NEGATIVE FOR MALIGNANCY.   F. BLADDER FLAP PERITONEUM; BIOPSY:  - BENIGN FIBROADIPOSE TISSUE.  - NEGATIVE FOR MALIGNANCY.   G. LYMPH NODE, RIGHT COMMON ILIAC; EXCISION:  - THREE LYMPH NODES NEGATIVE FOR MALIGNANCY (0/3).   H. LYMPH NODE, RIGHT PELVIC; EXCISION:  - ONE LYMPH NODE NEGATIVE FOR MALIGNANCY (0/1).   I. OMENTUM; OMENTECTOMY:  - BENIGN FIBROADIPOSE TISSUE.   - NEGATIVE FOR MALIGNANCY.   J. LYMPH NODE, RIGHT AORTIC; EXCISION:  - ONE LYMPH NODE NEGATIVE FOR MALIGNANCY (0/1).  DIAGNOSIS:  A. PELVIC WASHINGS:  - NEGATIVE FOR MALIGNANCY.  - ERYTHROCYTES AND REACTIVE MESOTHELIAL CELLS  Pathologic Staging: FIGO 1C2  Post op course has been uncomplicated. She received her second covid 19 vaccine. She received lovenox for 28 day course.    09/29/2019 CT C/A/P IMPRESSION: 1. Suspected thyroid nodule along the inferior isthmus margin of the thyroid gland along with a separate anterior mediastinal fairly high density mass with punctate calcifications. 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 Thyroid scintigraphy should be considered to further characterize these lesions; if negative on scintigraphy then further workup to exclude the possibility of lymphoma or thymic tumor would be suggested. 2. Contrast medium in the distal esophagus suggesting reflux or dysmotility. 3. Several tiny pulmonary nodules are present, 3 mm in average diameter or less. No follow-up needed if patient is low-risk (and has no known or suspected primary neoplasm). Non-contrast chest CT can be considered in 12 months if patient is high-risk.  4. Aortoiliac atherosclerotic vascular disease. 5. Multilevel lumbar degenerative disc disease. 6. Prior laparotomy with some faint stranding along the anterior omentum in the immediate vicinity of the laparotomy site, likely from benign scarring.  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  Genetic testing for germline BRCA1/2 mutations negative.   Problem List: Patient Active Problem List   Diagnosis Date Noted  . Chemotherapy-induced peripheral neuropathy ([place]) 02/24/2020  .  Genetic testing 11/02/2019  . Weight loss, unintentional 10/16/2019  . Chemotherapy induced diarrhea 10/13/2019  . Orthostasis 10/13/2019  . Hypokalemia 10/13/2019  . Restless leg 10/13/2019  . 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 Iron deficiency 10/02/2019  . Anxiety associated with cancer diagnosis ([place]) 10/02/2019  . Chemotherapy-induced nausea 10/02/2019  . Thyroid nodule 10/02/2019  . Hyponatremia 10/02/2019  . B12 deficiency 09/18/2019  . Family history of breast cancer   . Family history of leukemia   . Goals of care, counseling/discussion 09/07/2019  . Ovarian mass 08/19/2019  . 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) 08/19/2019  . 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[place])   . Cyst of ovary 07/15/2019  . Anxiety 09/02/2017  . 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 FH: diabetes mellitus 09/02/2017  . 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 Atypical nevus 09/02/2017  . IBS (irritable bowel syndrome) 09/02/2017  . Atrophic vaginitis 09/02/2017  . Chronic idiopathic urticaria 09/02/2017  . GERD (gastroesophageal reflux disease) 09/02/2017    Past Medical History:  Past Medical History:  Diagnosis Date  . Anxiety   . Arthritis   . Family history of breast cancer   . Family history of leukemia   . GERD (gastroesophageal reflux disease)   . History of ischemic colitis 09/02/2017  . Hx of colonic polyp   . Hypertension    H/O-PT TAKEN OFF BY PCP AS OF 2019  . IBS (irritable bowel syndrome)   . Ovarian cancer ([place])   . Ovarian cyst   . Personal history of chemotherapy 2021   6 treatments  . Vaginal atrophy     Past Surgical History: Past Surgical History:  Procedure Laterality Date  . ABDOMINAL HYSTERECTOMY    . BREAST BIOPSY Left    needle bx/clip-neg  . CERVICAL BIOPSY  W/ LOOP ELECTRODE EXCISION    . CHOLECYSTECTOMY    . Colonoscopoy  2007, 2008, 2011  . COLONOSCOPY WITH  PROPOFOL N/A 05/05/2018   Procedure: COLONOSCOPY WITH PROPOFOL;  Surgeon: Lin Landsman, MD;  [facility] ENDOSCOPY;  Service: Gastroenterology;  Laterality: N/A;  . HYSTERECTOMY ABDOMINAL WITH SALPINGO-OOPHORECTOMY N/A 08/19/2019   Procedure: HYSTERECTOMY ABDOMINAL WITH SALPINGO-OOPHORECTOMY WITH PELVIC WASHINGS PERITONEAL BIOPSIES AND PARA AORTIC LYMPH NODE DISSECTION;  Surgeon: [name], MD;  Location: [facility] ORS;  Service: Gynecology;  Laterality: N/A;  . LEEP    . OMENTECTOMY N/A 08/19/2019   Procedure: OMENTECTOMY;  Surgeon: [name], MD;  Location: [facility] ORS;  Service: Gynecology;  Laterality: N/A;  . OOPHORECTOMY    . PORTA CATH INSERTION N/A 09/11/2019   Procedure: PORTA CATH INSERTION;  Surgeon: Algernon Huxley, MD;  Location: [place] CV LAB;  Service: Cardiovascular;  Laterality: N/A;  . TUBAL LIGATION  1999    Past Gynecologic History:  Menarche: 9 Last Menstrual Period: menopausal History of Abnormal pap: No Contraception: TAH-SBO (07/2019); tubal ligation (1999)  OB History:  OB History  Gravida Para Term Preterm AB Living  0 0 0 0 0 0  SAB IAB Ectopic Multiple Live Births  0 0 0 0 0  Family History: Family History  Problem Relation Age of Onset  . Heart failure Mother   . Atrial fibrillation Mother   . Hypertension Mother   . Diabetes Father   . Heart attack Father   . Breast cancer Maternal Aunt 70  . Breast cancer Maternal Grandmother 80  . Breast cancer Cousin        2 mat cousins  . Hypertension Sister   . Diabetes Sister   . Blindness Sister   . Hypertension Sister   . Arthritis Sister   . Leukemia Paternal Aunt   . Leukemia Paternal Uncle   . Brain cancer Paternal Aunt   . Other Brother 13       Drowning accident  . Liver cancer Maternal Uncle   . Alcohol abuse Maternal Uncle   . Prostate cancer Neg Hx   . Kidney cancer Neg Hx   . BRCA 1/2 Neg Hx    Social History: Retired  Social History   Socioeconomic History   . Marital status: Single    Spouse name: Not on file  . Number of children: 0  . Years of education: Not on file  . Highest education level: Associate degree: academic program  Occupational History  . Occupation: Retired  Tobacco Use  . Smoking status: Never Smoker  . Smokeless tobacco: Never Used  . Tobacco comment: 2bd hand smoke from father  Vaping Use  . Vaping Use: Never used  Substance and Sexual Activity  . Alcohol use: Yes    Comment: rarely  . Drug use: No  . Sexual activity: Never    Birth control/protection: Post-menopausal  Other Topics Concern  . Not on file  Social History Narrative  . Not on file   Social Determinants of Health   Financial Resource Strain: Not on file  Food Insecurity: Not on file  Transportation Needs: Not on file  Physical Activity: Not on file  Stress: Not on file  Social Connections: Not on file  Intimate Partner Violence: Not on file   Allergies: Allergies  Allergen Reactions  . Citalopram Nausea And Vomiting  . Ivp Dye [Iodinated Diagnostic Agents] Hives and Swelling    hives  . Meloxicam     unknown  . Adhesive [Tape] Rash  . Amlodipine Rash  . Ampicillin Rash    Did it involve swelling of the face/tongue/throat, SOB, or low BP? No Did it involve sudden or severe rash/hives, skin peeling, or any reaction on the inside of your mouth or nose? Yes Did you need to seek medical attention at a hospital or doctor's office? Yes When did it last happen? 10 + years If all above answers are "NO", may proceed with cephalosporin use.  . Cefoxitin Rash  . Estradiol Rash  . Estropipate Rash  . Olmesartan Medoxomil-Hctz Rash  . Sertraline Rash       . Sulfa Antibiotics Rash   Current Medications: Current Outpatient Medications  Medication Sig Dispense Refill  . acetaminophen (TYLENOL) 500 MG tablet Take 1,000 mg by mouth every 6 (six) hours as needed (for pain.).    Marland Kitchen fluticasone (FLONASE) 50 MCG/ACT nasal spray Place 1  spray into both nostrils daily. otc    . lisinopril (ZESTRIL) 5 MG tablet Take 1 tablet (5 mg total) by mouth daily. 90 tablet 1  . loratadine (CLARITIN) 10 MG tablet Take 10 mg by mouth daily. otc    . LORazepam (ATIVAN) 0.5 MG tablet TAKE 1 TABLET (0.5 MG TOTAL) BY MOUTH EVERY 6 (  SIX) HOURS AS NEEDED (NAUSEA/VOMITING OR ANXIETY). 90 tablet 0  . mirabegron ER (MYRBETRIQ) 50 MG TB24 tablet Take 1 tablet (50 mg total) by mouth daily. 30 tablet 11  . pregabalin (LYRICA) 75 MG capsule Take 1 capsule (75 mg total) by mouth 2 (two) times daily. 60 capsule 3  . guaiFENesin (MUCINEX) 600 MG 12 hr tablet Take by mouth 2 (two) times daily.    Marland Kitchen guaiFENesin-codeine (ROBITUSSIN AC) 100-10 MG/5ML syrup Take 5 mLs by mouth 4 (four) times daily as needed for cough. 118 mL 0   No current facility-administered medications for this visit.   Facility-Administered Medications Ordered in Other Visits  Medication Dose Route Frequency Provider Last Rate Last Admin  . sodium chloride flush (NS) 0.9 % injection 10 mL  10 mL Intravenous PRN Sindy Guadeloupe, MD   10 mL at 01/05/20 0750     Review of Systems General:  no complaints Skin: no complaints Eyes: no complaints HEENT: no complaints Breasts: no complaints Pulmonary: no complaints Cardiac: no complaints Gastrointestinal: no complaints Genitourinary/Sexual: no complaints Ob/Gyn: no complaints Musculoskeletal: no complaints Hematology: no complaints Neurologic/Psych: no complaints   Objective:  Physical Examination:  Vitals:   07/27/20 1359  BP: 140/64  Pulse: 76  Resp: 20  Temp: 98 F (36.7 C)  SpO2: 100%  Weight: 178 lb 9.6 oz (81 kg)   ECOG Performance Status: 1 - Symptomatic but completely ambulatory  EXAM  GENERAL: Patient is a well appearing female in no acute distress HEENT:  Sclera clear. Anicteric NODES:  Negative axillary, supraclavicular, inguinal lymph node survery LUNGS:  Clear to auscultation bilaterally.   HEART:  Regular  rate and rhythm.  ABDOMEN:  Soft, nontender.  No hernias, incisions well healed. No masses or ascites EXTREMITIES:  No peripheral edema. Atraumatic. No cyanosis SKIN:  Clear with no obvious rashes or skin changes.  NEURO:  Nonfocal. Well oriented.  Appropriate affect.  PELVIC: exam chaperoned by nurse;   Vulva: normal appearing vulva with no masses, tenderness or lesions; Vagina: normal vagina healing well. Bimanual: normal  Lab Review CA 125 trend 12/29/2019       7.7 09/15/2019      18.1  06/29/2019      10.3  Radiologic Imaging: No imaging on site today.     Assessment:  Carrie Roberson is a 75 y.o. 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  Finished 6 cycles of carbo/taxol chemotherapy with Neulasta 6/21. 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   Germline genetic testing negative for BRCA mutations.   Medical co-morbidities complicating care: HTN, obesity and prior abdominal surgery (BTL and cholecystectomy). Body mass index is 31.64 kg/m.   Plan:   Problem List Items Addressed This Visit      Endocrine   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[place])    Other Visit Diagnoses    Encounter for follow-up surveillance of ovarian cancer    -  Primary     PARP inhibitor maintenance therapy not indicated for stage I disease (even if she had a BRCA mutation).  Does not need somatic tumor testing, as PARP inhibitor maintenance not approved for stage I disease.   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   Plan for q 3 month surveillance alternating between Gyn Onc and Med Onc with exams and CA125 following recent  completion of 6 cycles of carboplatin/taxol chemotherapy.  Estimated risk of recurrence 10-20%.    Beckey Rutter, DNP, AGNP-C [place] at [facility] [phone] (clinic)  I personally interviewed and examined  the patient. Agreed with the above/below plan of care. I have directly contributed to assessment and plan of care of this patient and educated and discussed with patient and family.  Mellody Drown, MD

## 2020-07-27 NOTE — Patient Instructions (Addendum)
Always great to see you Ms. Maciolek!

## 2020-08-03 ENCOUNTER — Encounter: Payer: Self-pay | Admitting: Nurse Practitioner

## 2020-08-09 ENCOUNTER — Other Ambulatory Visit: Payer: Self-pay | Admitting: Obstetrics and Gynecology

## 2020-08-09 MED ORDER — FLUCONAZOLE 150 MG PO TABS: 150.0000 mg | ORAL_TABLET | Freq: Once | ORAL | 0 refills | Status: AC

## 2020-08-11 ENCOUNTER — Other Ambulatory Visit: Payer: Self-pay

## 2020-08-11 ENCOUNTER — Encounter: Payer: Self-pay | Admitting: Family Medicine

## 2020-08-11 ENCOUNTER — Ambulatory Visit (INDEPENDENT_AMBULATORY_CARE_PROVIDER_SITE_OTHER): Payer: Medicare Other | Admitting: Family Medicine

## 2020-08-11 VITALS — BP 138/80 | HR 84 | Ht 63.0 in | Wt 180.0 lb

## 2020-08-11 DIAGNOSIS — L819 Disorder of pigmentation, unspecified: Secondary | ICD-10-CM

## 2020-08-11 NOTE — Progress Notes (Signed)
Date:  08/11/2020   Name:  Carrie Roberson   DOB:  1946-03-08   MRN:  [id number]   Chief Complaint: Rash (Black and red spot that comes and goes on top of R) thigh- this is the 3rd time over the past 6 months that it has occurred. Oncologist didn't seem too concerned, but it was not inflamed at the time. It is "inflamed now")  Rash This is a new problem. The current episode started more than 1 month ago (3 episodes). The problem has been waxing and waning since onset. The affected locations include the right upper leg. The rash is characterized by redness (central dark blue /slightly raised). Associated with: history of ovarian cancer. Pertinent negatives include no congestion, cough, diarrhea, eye pain, fatigue, fever, rhinorrhea, shortness of breath, sore throat or vomiting. Past treatments include nothing.    Lab Results  Component Value Date   CREATININE 0.60 06/21/2020   BUN 15 06/21/2020   NA 137 06/21/2020   K 4.1 06/21/2020   CL 104 06/21/2020   CO2 25 06/21/2020   Lab Results  Component Value Date   CHOL 188 09/02/2017   HDL 53 09/02/2017   LDLCALC 109 (H) 09/02/2017   TRIG 131 09/02/2017   CHOLHDL 3.5 09/02/2017   Lab Results  Component Value Date   TSH 0.856 09/02/2017   No results found for: HGBA1C Lab Results  Component Value Date   WBC 4.1 06/21/2020   HGB 13.9 06/21/2020   HCT 41.5 06/21/2020   MCV 92.6 06/21/2020   PLT 235 06/21/2020   Lab Results  Component Value Date   ALT 15 06/21/2020   AST 16 06/21/2020   ALKPHOS 82 06/21/2020   BILITOT 0.6 06/21/2020     Review of Systems  Constitutional: Negative.  Negative for chills, fatigue, fever and unexpected weight change.  HENT: Negative for congestion, ear discharge, ear pain, rhinorrhea, sinus pressure, sneezing and sore throat.   Eyes: Negative for double vision, photophobia, pain, discharge, redness and itching.  Respiratory: Negative for cough, shortness of breath, wheezing and stridor.    Gastrointestinal: Negative for abdominal pain, blood in stool, constipation, diarrhea, nausea and vomiting.  Endocrine: Negative for cold intolerance, heat intolerance, polydipsia, polyphagia and polyuria.  Genitourinary: Negative for dysuria, flank pain, frequency, hematuria, menstrual problem, pelvic pain, urgency, vaginal bleeding and vaginal discharge.  Musculoskeletal: Negative for arthralgias, back pain and myalgias.  Skin: Positive for rash.  Allergic/Immunologic: Negative for environmental allergies and food allergies.  Neurological: Negative for dizziness, weakness, light-headedness, numbness and headaches.  Hematological: Negative for adenopathy. Does not bruise/bleed easily.  Psychiatric/Behavioral: Negative for dysphoric mood. The patient is not nervous/anxious.     Patient Active Problem List   Diagnosis Date Noted  . Chemotherapy-induced peripheral neuropathy ([place]) 02/24/2020  . Genetic testing 11/02/2019  . Weight loss, unintentional 10/16/2019  . Chemotherapy induced diarrhea 10/13/2019  . Orthostasis 10/13/2019  . Hypokalemia 10/13/2019  . Restless leg 10/13/2019  . 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 Iron deficiency 10/02/2019  . Anxiety associated with cancer diagnosis ([place]) 10/02/2019  . Chemotherapy-induced nausea 10/02/2019  . Thyroid nodule 10/02/2019  . Hyponatremia 10/02/2019  . B12 deficiency 09/18/2019  . Family history of breast cancer   . Family history of leukemia   . Goals of care, counseling/discussion 09/07/2019  . Ovarian mass 08/19/2019  . 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) 08/19/2019  . 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[place])   . Cyst of  ovary 07/15/2019  . Anxiety 09/02/2017  . 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 FH: diabetes mellitus 09/02/2017  . 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 Atypical nevus 09/02/2017  . IBS (irritable bowel  syndrome) 09/02/2017  . Atrophic vaginitis 09/02/2017  . Chronic idiopathic urticaria 09/02/2017  . GERD (gastroesophageal reflux disease) 09/02/2017    Allergies  Allergen Reactions  . Citalopram Nausea And Vomiting  . Ivp Dye [Iodinated Diagnostic Agents] Hives and Swelling    hives  . Meloxicam     unknown  . Adhesive [Tape] Rash  . Amlodipine Rash  . Ampicillin Rash    Did it involve swelling of the face/tongue/throat, SOB, or low BP? No Did it involve sudden or severe rash/hives, skin peeling, or any reaction on the inside of your mouth or nose? Yes Did you need to seek medical attention at a hospital or doctor's office? Yes When did it last happen? 10 + years If all above answers are "NO", may proceed with cephalosporin use.  . Cefoxitin Rash  . Estradiol Rash  . Estropipate Rash  . Olmesartan Medoxomil-Hctz Rash  . Sertraline Rash       . Sulfa Antibiotics Rash    Past Surgical History:  Procedure Laterality Date  . ABDOMINAL HYSTERECTOMY    . BREAST BIOPSY Left    needle bx/clip-neg  . CERVICAL BIOPSY  W/ LOOP ELECTRODE EXCISION    . CHOLECYSTECTOMY    . Colonoscopoy  2007, 2008, 2011  . COLONOSCOPY WITH PROPOFOL N/A 05/05/2018   Procedure: COLONOSCOPY WITH PROPOFOL;  Surgeon: Lin Landsman, MD;  Location: [facility] ENDOSCOPY;  Service: Gastroenterology;  Laterality: N/A;  . HYSTERECTOMY ABDOMINAL WITH SALPINGO-OOPHORECTOMY N/A 08/19/2019   Procedure: HYSTERECTOMY ABDOMINAL WITH SALPINGO-OOPHORECTOMY WITH PELVIC WASHINGS PERITONEAL BIOPSIES AND PARA AORTIC LYMPH NODE DISSECTION;  Surgeon: [name], MD;  Location: [facility] ORS;  Service: Gynecology;  Laterality: N/A;  . LEEP    . OMENTECTOMY N/A 08/19/2019   Procedure: OMENTECTOMY;  Surgeon: [name], MD;  Location: [facility] ORS;  Service: Gynecology;  Laterality: N/A;  . OOPHORECTOMY    . PORTA CATH INSERTION N/A 09/11/2019   Procedure: PORTA CATH INSERTION;  Surgeon: Algernon Huxley, MD;  Location:  [facility] CV LAB;  Service: Cardiovascular;  Laterality: N/A;  . TUBAL LIGATION  1999    Social History   Tobacco Use  . Smoking status: Never Smoker  . Smokeless tobacco: Never Used  . Tobacco comment: 2bd hand smoke from father  Vaping Use  . Vaping Use: Never used  Substance Use Topics  . Alcohol use: Yes    Comment: rarely  . Drug use: No     Medication list has been reviewed and updated.  Current Meds  Medication Sig  . acetaminophen (TYLENOL) 500 MG tablet Take 1,000 mg by mouth every 6 (six) hours as needed (for pain.).  Marland Kitchen lisinopril (ZESTRIL) 5 MG tablet Take 1 tablet (5 mg total) by mouth daily.  Marland Kitchen loratadine (CLARITIN) 10 MG tablet Take 10 mg by mouth daily. otc  . LORazepam (ATIVAN) 0.5 MG tablet TAKE 1 TABLET (0.5 MG TOTAL) BY MOUTH EVERY 6 (SIX) HOURS AS NEEDED (NAUSEA/VOMITING OR ANXIETY).  . mirabegron ER (MYRBETRIQ) 50 MG TB24 tablet Take 1 tablet (50 mg total) by mouth daily.  . pregabalin (LYRICA) 75 MG capsule Take 1 capsule (75 mg total) by mouth 2 (two) times daily.    PHQ 2/9 Scores 08/11/2020 03/29/2020 02/23/2020 03/23/2019  PHQ -  2 Score 0 2 2 3   PHQ- 9 Score 0 3 8 9     GAD 7 : Generalized Anxiety Score 03/29/2020 02/23/2020 03/23/2019  Nervous, Anxious, on Edge 1 1 1   Control/stop worrying 0 0 0  Worry too much - different things 0 0 2  Trouble relaxing 0 0 0  Restless 0 0 0  Easily annoyed or irritable 1 0 0  Afraid - awful might happen 0 0 1  Total GAD 7 Score 2 1 4   Anxiety Difficulty Not difficult at all Not difficult at all Somewhat difficult    BP Readings from Last 3 Encounters:  08/11/20 138/80  07/27/20 140/64  05/09/20 126/75    Physical Exam Skin:         Comments: Halo of reddness/centralized dark     Wt Readings from Last 3 Encounters:  08/11/20 180 lb (81.6 kg)  07/27/20 178 lb 9.6 oz (81 kg)  05/09/20 170 lb 9.6 oz (77.4 kg)    BP 138/80   Pulse 84   Ht 5\' 3"  (1.6 m)   Wt 180 lb (81.6 kg)   BMI 31.89 kg/m    Assessment and Plan: 1. Pigmented skin lesion New onset.  Persistent.  Changes in nature from central pigmented area to surrounding erythema and then back to just a centralized increased pigmentation area.  It appears to be some feeling underneath it as if it is either a thrombosed vein or may be some other concern.  Upon questioning patient is a history of ovarian cancer in the there is some concern on my part that this may be a cutaneous manifestation of a metastases thereof.  I have referred her to dermatology for evaluation with hopes that she pulls this out and that this does not become an issue.  If there is further evaluation I am sure that this will be carried out as well - Ambulatory referral to Dermatology

## 2020-08-15 DIAGNOSIS — L271 Localized skin eruption due to drugs and medicaments taken internally: Secondary | ICD-10-CM | POA: Diagnosis not present

## 2020-09-13 ENCOUNTER — Telehealth: Payer: Self-pay | Admitting: *Deleted

## 2020-09-13 NOTE — Telephone Encounter (Signed)
Please contact patient directly to see if she would like refill sent. I believe she had discontinued this medication. Thanks!

## 2020-09-13 NOTE — Telephone Encounter (Signed)
Refill request from pharmacy for Duloxetine which was discontinued in the fall as being ineffective. Please advise

## 2020-09-13 NOTE — Telephone Encounter (Signed)
Patient states that she did not request that and does not need it

## 2020-09-19 ENCOUNTER — Inpatient Hospital Stay: Payer: Medicare Other | Attending: Oncology

## 2020-09-19 DIAGNOSIS — C569 Malignant neoplasm of unspecified ovary: Secondary | ICD-10-CM | POA: Insufficient documentation

## 2020-09-19 DIAGNOSIS — Z95828 Presence of other vascular implants and grafts: Secondary | ICD-10-CM

## 2020-09-19 DIAGNOSIS — Z452 Encounter for adjustment and management of vascular access device: Secondary | ICD-10-CM | POA: Diagnosis not present

## 2020-09-19 MED ORDER — HEPARIN SOD (PORK) LOCK FLUSH 100 UNIT/ML IV SOLN
500.0000 [IU] | Freq: Once | INTRAVENOUS | Status: AC
  Administered 2020-09-19: 500 [IU] via INTRAVENOUS
  Filled 2020-09-19: qty 5

## 2020-09-19 MED ORDER — SODIUM CHLORIDE 0.9% FLUSH
10.0000 mL | Freq: Once | INTRAVENOUS | Status: AC
  Administered 2020-09-19: 10 mL via INTRAVENOUS
  Filled 2020-09-19: qty 10

## 2020-09-19 MED ORDER — HEPARIN SOD (PORK) LOCK FLUSH 100 UNIT/ML IV SOLN
INTRAVENOUS | Status: AC
  Filled 2020-09-19: qty 5

## 2020-09-20 ENCOUNTER — Encounter: Payer: Self-pay | Admitting: Oncology

## 2020-09-28 ENCOUNTER — Other Ambulatory Visit: Payer: Self-pay | Admitting: Family Medicine

## 2020-09-28 DIAGNOSIS — I10 Essential (primary) hypertension: Secondary | ICD-10-CM

## 2020-11-17 ENCOUNTER — Inpatient Hospital Stay (HOSPITAL_BASED_OUTPATIENT_CLINIC_OR_DEPARTMENT_OTHER): Payer: Medicare Other | Admitting: Oncology

## 2020-11-17 ENCOUNTER — Inpatient Hospital Stay: Payer: Medicare Other | Attending: Oncology

## 2020-11-17 ENCOUNTER — Encounter: Payer: Self-pay | Admitting: Oncology

## 2020-11-17 ENCOUNTER — Other Ambulatory Visit: Payer: Self-pay

## 2020-11-17 VITALS — BP 158/82 | HR 69 | Temp 97.3°F | Wt 181.2 lb

## 2020-11-17 DIAGNOSIS — Z8543 Personal history of malignant neoplasm of ovary: Secondary | ICD-10-CM

## 2020-11-17 DIAGNOSIS — Z9221 Personal history of antineoplastic chemotherapy: Secondary | ICD-10-CM | POA: Diagnosis not present

## 2020-11-17 DIAGNOSIS — Z08 Encounter for follow-up examination after completed treatment for malignant neoplasm: Secondary | ICD-10-CM

## 2020-11-17 DIAGNOSIS — Z806 Family history of leukemia: Secondary | ICD-10-CM | POA: Insufficient documentation

## 2020-11-17 DIAGNOSIS — Z800 Family history of malignant neoplasm of digestive organs: Secondary | ICD-10-CM | POA: Diagnosis not present

## 2020-11-17 DIAGNOSIS — Z803 Family history of malignant neoplasm of breast: Secondary | ICD-10-CM | POA: Insufficient documentation

## 2020-11-17 DIAGNOSIS — F32A Depression, unspecified: Secondary | ICD-10-CM | POA: Diagnosis not present

## 2020-11-17 DIAGNOSIS — G479 Sleep disorder, unspecified: Secondary | ICD-10-CM | POA: Diagnosis not present

## 2020-11-17 DIAGNOSIS — Z809 Family history of malignant neoplasm, unspecified: Secondary | ICD-10-CM | POA: Insufficient documentation

## 2020-11-17 DIAGNOSIS — F3289 Other specified depressive episodes: Secondary | ICD-10-CM | POA: Diagnosis not present

## 2020-11-17 LAB — COMPREHENSIVE METABOLIC PANEL
ALT: 21 U/L (ref 0–44)
AST: 18 U/L (ref 15–41)
Albumin: 3.9 g/dL (ref 3.5–5.0)
Alkaline Phosphatase: 86 U/L (ref 38–126)
Anion gap: 8 (ref 5–15)
BUN: 14 mg/dL (ref 8–23)
CO2: 24 mmol/L (ref 22–32)
Calcium: 9.9 mg/dL (ref 8.9–10.3)
Chloride: 104 mmol/L (ref 98–111)
Creatinine, Ser: 0.75 mg/dL (ref 0.44–1.00)
GFR, Estimated: >60 mL/min (ref >60)
Glucose, Bld: 96 mg/dL (ref 70–99)
Potassium: 4.2 mmol/L (ref 3.5–5.1)
Sodium: 136 mmol/L (ref 135–145)
Total Bilirubin: 0.5 mg/dL (ref 0.3–1.2)
Total Protein: 6.6 g/dL (ref 6.5–8.1)

## 2020-11-17 LAB — CBC WITH DIFFERENTIAL/PLATELET
Abs Immature Granulocytes: 0 10*3/uL (ref 0.00–0.07)
Basophils Absolute: 0 10*3/uL (ref 0.0–0.1)
Basophils Relative: 0 %
Eosinophils Absolute: 0.3 10*3/uL (ref 0.0–0.5)
Eosinophils Relative: 6 %
HCT: 43.6 % (ref 36.0–46.0)
Hemoglobin: 14.6 g/dL (ref 12.0–15.0)
Immature Granulocytes: 0 %
Lymphocytes Relative: 46 %
Lymphs Abs: 1.9 10*3/uL (ref 0.7–4.0)
MCH: 30.8 pg (ref 26.0–34.0)
MCHC: 33.5 g/dL (ref 30.0–36.0)
MCV: 92 fL (ref 80.0–100.0)
Monocytes Absolute: 0.5 10*3/uL (ref 0.1–1.0)
Monocytes Relative: 12 %
Neutro Abs: 1.5 10*3/uL — ABNORMAL LOW (ref 1.7–7.7)
Neutrophils Relative %: 36 %
Platelets: 226 10*3/uL (ref 150–400)
RBC: 4.74 MIL/uL (ref 3.87–5.11)
RDW: 12.4 % (ref 11.5–15.5)
WBC: 4.1 10*3/uL (ref 4.0–10.5)
nRBC: 0 % (ref 0.0–0.2)

## 2020-11-17 MED ORDER — SODIUM CHLORIDE 0.9% FLUSH
10.0000 mL | INTRAVENOUS | Status: DC | PRN
  Administered 2020-11-17: 10 mL via INTRAVENOUS
  Filled 2020-11-17: qty 10

## 2020-11-17 MED ORDER — HEPARIN SOD (PORK) LOCK FLUSH 100 UNIT/ML IV SOLN
500.0000 [IU] | Freq: Once | INTRAVENOUS | Status: AC
  Administered 2020-11-17: 500 [IU] via INTRAVENOUS
  Filled 2020-11-17: qty 5

## 2020-11-17 MED ORDER — VENLAFAXINE HCL ER 37.5 MG PO CP24: 37.5000 mg | ORAL_CAPSULE | Freq: Every day | ORAL | 0 refills | Status: DC

## 2020-11-17 MED ORDER — HEPARIN SOD (PORK) LOCK FLUSH 100 UNIT/ML IV SOLN
INTRAVENOUS | Status: AC
  Filled 2020-11-17: qty 5

## 2020-11-17 NOTE — Progress Notes (Signed)
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 This was followed by an MR pelvis with and without contrast in December 2020 which showed a large cystic mass arising from the right ovary measuring 1.8 cm. This was followed by TAH/BSO with peritoneal biopsies with pelvic/aortic lymph node dissection pelvic washings and omentectomy with Dr. Fransisca Connors and Dr. Georgianne Fick on 08/19/2019. 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 Angiolymphatic invasion is present. Bilateral fallopian tubes were negative for atypia and malignancy. Peritoneal stone excision negative for malignancy. Uterus with cervix negative for atypia and malignancy. Bladder, right gutter negative for malignancy. 5 lymph nodes were sampled and were negative for malignancy. Pelvic washings were also negative for malignancy. Pathologic staging FIGO1 C2  Patient was seen by Dr. Fransisca Connors postoperatively and recommendation was to proceed with adjuvant chemotherapy carbotaxol for 6 cycles.  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 The CancerNext gene panel offered by  [employer] includes sequencing and rearrangement analysis for the following 34 genes: APC, ATM, BARD1, BMPR1A, BRCA1, BRCA2, BRIP1, CDH1, CDK4, CDKN2A, CHEK2, DICER1, HOXB13, EPCAM, GREM1, MLH1, MRE11A, MSH2, MSH6, MUTYH, NBN, NF1, PALB2, PMS2, POLD1, POLE, PTEN, RAD50, RAD51C, RAD51D, SMAD4, SMARCA4, STK11, and TP53. The report date is 10/29/2019.  TumorHRD testing was negative.       Interval history-patient currently reports feeling depressed.  She used to enjoy sewing before but she does not do it consistently anymore.  She has difficulty sleeping at night.  She denies any suicidal or homicidal ideations.  She is not a very socially active person but does not interact with her family.  She lives with her sister  ECOG PS- 1 Pain scale- 0   Review of systems- Review of Systems  Constitutional: Negative for chills, fever, malaise/fatigue and weight loss.  HENT: Negative for congestion, ear discharge and nosebleeds.   Eyes: Negative for blurred vision.  Respiratory: Negative for cough, hemoptysis, sputum production, shortness of breath and wheezing.   Cardiovascular: Negative for chest pain, palpitations, orthopnea and claudication.  Gastrointestinal: Negative for abdominal pain, blood in stool, constipation, diarrhea, heartburn, melena, nausea and vomiting.  Genitourinary: Negative for dysuria, flank pain, frequency, hematuria and urgency.  Musculoskeletal: Negative for back pain, joint pain and myalgias.  Skin: Negative for rash.  Neurological: Negative for dizziness, tingling, focal weakness, seizures, weakness and headaches.  Endo/Heme/Allergies: Does not bruise/bleed easily.  Psychiatric/Behavioral: Negative for depression and suicidal ideas. The patient does not have insomnia.       Allergies  Allergen Reactions  . Citalopram Nausea And Vomiting  . Ivp Dye [Iodinated Diagnostic Agents] Hives and Swelling    hives  . Meloxicam     unknown  . Adhesive [Tape] Rash  .  Amlodipine Rash  . Ampicillin Rash  Did it involve swelling of the face/tongue/throat, SOB, or low BP? No Did it involve sudden or severe rash/hives, skin peeling, or any reaction on the inside of your mouth or nose? Yes Did you need to seek medical attention at a hospital or doctor's office? Yes When did it last happen? 10 + years If all above answers are "NO", may proceed with cephalosporin use.  . Cefoxitin Rash  . Estradiol Rash  . Estropipate Rash  . Olmesartan Medoxomil-Hctz Rash  . Sertraline Rash       . Sulfa Antibiotics Rash     Past Medical History:  Diagnosis Date  . Anxiety   . Arthritis   . Diverticulitis   . Family history of breast cancer   . Family history of leukemia   . GERD (gastroesophageal reflux disease)   . History of ischemic colitis 09/02/2017  . Hx of colonic polyp   . Hypertension    H/O-PT TAKEN OFF BY PCP AS OF 2019  . IBS (irritable bowel syndrome)   . Ovarian cancer ([place])   . Ovarian cyst   . Personal history of chemotherapy 2021   6 treatments  . Vaginal atrophy      Past Surgical History:  Procedure Laterality Date  . ABDOMINAL HYSTERECTOMY    . BREAST BIOPSY Left    needle bx/clip-neg  . CERVICAL BIOPSY  W/ LOOP ELECTRODE EXCISION    . CHOLECYSTECTOMY    . Colonoscopoy  2007, 2008, 2011  . COLONOSCOPY WITH PROPOFOL N/A 05/05/2018   Procedure: COLONOSCOPY WITH PROPOFOL;  Surgeon: Lin Landsman, MD;  Location: [facility] ENDOSCOPY;  Service: Gastroenterology;  Laterality: N/A;  . HYSTERECTOMY ABDOMINAL WITH SALPINGO-OOPHORECTOMY N/A 08/19/2019   Procedure: HYSTERECTOMY ABDOMINAL WITH SALPINGO-OOPHORECTOMY WITH PELVIC WASHINGS PERITONEAL BIOPSIES AND PARA AORTIC LYMPH NODE DISSECTION;  Surgeon: [name], MD;  Location: [facility] ORS;  Service: Gynecology;  Laterality: N/A;  . LEEP    . OMENTECTOMY N/A 08/19/2019   Procedure: OMENTECTOMY;  Surgeon: [name], MD;  Location: [facility] ORS;  Service: Gynecology;   Laterality: N/A;  . OOPHORECTOMY    . PORTA CATH INSERTION N/A 09/11/2019   Procedure: PORTA CATH INSERTION;  Surgeon: Algernon Huxley, MD;  Location: [place] [facility] LAB;  Service: Cardiovascular;  Laterality: N/A;  . TUBAL LIGATION  1999    Social History   Socioeconomic History  . Marital status: Single    Spouse name: Not on file  . Number of children: 0  . Years of education: Not on file  . Highest education level: Associate degree: academic program  Occupational History  . Occupation: Retired  Tobacco Use  . Smoking status: Never Smoker  . Smokeless tobacco: Never Used  . Tobacco comment: 2bd hand smoke from father  Vaping Use  . Vaping Use: Never used  Substance and Sexual Activity  . Alcohol use: Yes    Comment: rarely  . Drug use: No  . Sexual activity: Never    Birth control/protection: Post-menopausal  Other Topics Concern  . Not on file  Social History Narrative  . Not on file   Social Determinants of Health   Financial Resource Strain: Not on file  Food Insecurity: Not on file  Transportation Needs: Not on file  Physical Activity: Not on file  Stress: Not on file  Social Connections: Not on file  Intimate Partner Violence: Not on file    Family History  Problem Relation Age of Onset  . Heart failure Mother   . Atrial fibrillation  Mother   . Hypertension Mother   . Diabetes Father   . Heart attack Father   . Breast cancer Maternal Aunt 70  . Breast cancer Maternal Grandmother 80  . Breast cancer Cousin        2 mat cousins  . Hypertension Sister   . Diabetes Sister   . Blindness Sister   . Hypertension Sister   . Arthritis Sister   . Leukemia Paternal Aunt   . Leukemia Paternal Uncle   . Brain cancer Paternal Aunt   . Other Brother 13       Drowning accident  . Liver cancer Maternal Uncle   . Alcohol abuse Maternal Uncle   . Prostate cancer Neg Hx   . Kidney cancer Neg Hx   . BRCA 1/2 Neg Hx      Current Outpatient Medications:  .   acetaminophen (TYLENOL) 500 MG tablet, Take 1,000 mg by mouth every 6 (six) hours as needed (for pain.)., Disp: , Rfl:  .  lisinopril (ZESTRIL) 5 MG tablet, TAKE 1 TABLET BY MOUTH EVERY DAY, Disp: 90 tablet, Rfl: 1 .  loratadine (CLARITIN) 10 MG tablet, Take 10 mg by mouth daily. otc, Disp: , Rfl:  .  LORazepam (ATIVAN) 0.5 MG tablet, TAKE 1 TABLET (0.5 MG TOTAL) BY MOUTH EVERY 6 (SIX) HOURS AS NEEDED (NAUSEA/VOMITING OR ANXIETY)., Disp: 90 tablet, Rfl: 0 .  mirabegron ER (MYRBETRIQ) 50 MG TB24 tablet, Take 1 tablet (50 mg total) by mouth daily., Disp: 30 tablet, Rfl: 11 .  pregabalin (LYRICA) 75 MG capsule, Take 1 capsule (75 mg total) by mouth 2 (two) times daily., Disp: 60 capsule, Rfl: 3 .  venlafaxine XR (EFFEXOR-XR) 37.5 MG 24 hr capsule, Take 1 capsule (37.5 mg total) by mouth daily with breakfast., Disp: 30 capsule, Rfl: 0 .  fluticasone (FLONASE) 50 MCG/ACT nasal spray, Place 1 spray into both nostrils daily. otc (Patient not taking: No sig reported), Disp: , Rfl:  No current facility-administered medications for this visit.  Facility-Administered Medications Ordered in Other Visits:  .  sodium chloride flush (NS) 0.9 % injection 10 mL, 10 mL, Intravenous, PRN, Sindy Guadeloupe, MD, 10 mL at 01/05/20 0750  Physical exam:  Vitals:   11/17/20 1413  BP: (!) 158/82  Pulse: 69  Temp: (!) 97.3 F (36.3 C)  TempSrc: Tympanic  SpO2: 100%  Weight: 181 lb 3.2 oz (82.2 kg)   Physical Exam Constitutional:      General: She is not in acute distress. Cardiovascular:     Rate and Rhythm: Normal rate and regular rhythm.     Heart sounds: Normal heart sounds.  Pulmonary:     Effort: Pulmonary effort is normal.     Breath sounds: Normal breath sounds.  Abdominal:     General: Bowel sounds are normal.     Palpations: Abdomen is soft.  Skin:    General: Skin is warm and dry.  Neurological:     Mental Status: She is alert and oriented to person, place, and time.      CMP Latest Ref Rng &  Units 11/17/2020  Glucose 70 - 99 mg/dL 96  BUN 8 - 23 mg/dL 14  Creatinine 0.44 - 1.00 mg/dL 0.75  Sodium 135 - 145 mmol/L 136  Potassium 3.5 - 5.1 mmol/L 4.2  Chloride 98 - 111 mmol/L 104  CO2 22 - 32 mmol/L 24  Calcium 8.9 - 10.3 mg/dL 9.9  Total Protein 6.5 - 8.1 g/dL 6.6  Total Bilirubin  0.3 - 1.2 mg/dL 0.5  Alkaline Phos 38 - 126 U/L 86  AST 15 - 41 U/L 18  ALT 0 - 44 U/L 21   CBC Latest Ref Rng & Units 11/17/2020  WBC 4.0 - 10.5 K/uL 4.1  Hemoglobin 12.0 - 15.0 g/dL 14.6  Hematocrit 36.0 - 46.0 % 43.6  Platelets 150 - 400 K/uL 226     Assessment and plan- Patient is a 75 y.o. 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    She is s/p 6 cycles of CarboTaxol adjuvant chemotherapy and is here for routine follow-up  From an ovarian cancer standpoint patient is doing well with no clinical signs and symptoms of recurrence on today's exam.  She also continues to follow-up with Dr. Georgianne Fick from GYN as well as GYN oncology.  Patient has baseline neutropenia which is stable and labs otherwise unremarkable.  CA125 from today is pending.  Depression: Patient demonstrates signs and symptoms of depression.  Denies any suicidal or homicidal ideations.  She is noted to have allergies to both Zoloft and citalopram.  I have started her on low-dose Effexor.  I would like her to follow-up with Dr. Ronnald Ramp to further uptitrate the dose or change her to a different antidepressant if need be.  I will forward my note to Dr. Ronnald Ramp for this.  I will see her back in 6 months with CBC with differential CMP and CA125   Visit Diagnosis 1. Encounter for follow-up surveillance of ovarian cancer   2. Other depression      Dr. Randa Evens, MD, MPH [facility] [phone] 11/17/2020 4:52 PM

## 2020-11-17 NOTE — Progress Notes (Signed)
Pt having survivor guilt. She has spoke to lauren about it and she told her that it was normal for a lot of people. She  Says that she wants to clean out a craft room to be able to start sewing and she works on it and then stops for no reason. She wants to do things but then just stops. She has gained wt over time and she thinks that it brings her some comfort and does not need to do that. She had some ativan left over from nausea and she has used them . She has 1-2 tablets. She is interested in getting on depression pill but pills that makes her drowsy or dizzy or things that inhibits her albility to do things in the day she takes at night if that is ok. 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

## 2020-11-18 LAB — CA 125: Cancer Antigen (CA) 125: 8.4 U/mL (ref 0.0–38.1)

## 2020-11-21 ENCOUNTER — Encounter: Payer: Self-pay | Admitting: *Deleted

## 2020-12-05 ENCOUNTER — Ambulatory Visit: Payer: Medicare Other | Admitting: Obstetrics and Gynecology

## 2020-12-07 ENCOUNTER — Ambulatory Visit: Payer: Medicare Other | Admitting: Obstetrics and Gynecology

## 2020-12-14 ENCOUNTER — Other Ambulatory Visit: Payer: Self-pay

## 2020-12-14 ENCOUNTER — Ambulatory Visit (INDEPENDENT_AMBULATORY_CARE_PROVIDER_SITE_OTHER): Payer: Medicare Other | Admitting: Obstetrics and Gynecology

## 2020-12-14 ENCOUNTER — Encounter: Payer: Self-pay | Admitting: Obstetrics and Gynecology

## 2020-12-14 VITALS — BP 152/86 | Ht 62.0 in | Wt 178.0 lb

## 2020-12-14 DIAGNOSIS — R300 Dysuria: Secondary | ICD-10-CM

## 2020-12-14 DIAGNOSIS — C561 Malignant neoplasm of right ovary: Secondary | ICD-10-CM | POA: Diagnosis not present

## 2020-12-14 DIAGNOSIS — R350 Frequency of micturition: Secondary | ICD-10-CM | POA: Diagnosis not present

## 2020-12-14 DIAGNOSIS — N390 Urinary tract infection, site not specified: Secondary | ICD-10-CM | POA: Diagnosis not present

## 2020-12-14 MED ORDER — NITROFURANTOIN MONOHYD MACRO 100 MG PO CAPS: 100.0000 mg | ORAL_CAPSULE | Freq: Two times a day (BID) | ORAL | 0 refills | Status: AC

## 2020-12-14 NOTE — Progress Notes (Signed)
Obstetrics & Gynecology Office Visit   Chief Complaint:  Chief Complaint  Patient presents with  . Follow-up    8 months per [place] (hysterectomy 2021), uti symptom urgency, burning x1 day. 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  Her initial staging procedure was conducted 08/19/2019 with no disease noted outside of the right ovary.  She completed 6 cycles of carbo taxol.  Postoperative surveillance with CA-125 has not shown any evidence of recurrence of disease.  Last imaging CT scan 02/16/2020 normal.  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  She denies abdominal pain, early satiety, or abdominal bloating.  She does report urinary frequency and dysuria. She is on Myrbetriq for overactive bladder.  She has struggled with some survivors guilt was prescribed some medicine by her oncologist but has not needed to use it.    Review of Systems:Review of Systems  Constitutional: Negative.   Gastrointestinal: Negative.   Genitourinary: Negative.   Neurological: Positive for tingling.  Psychiatric/Behavioral: Positive for depression. Negative for hallucinations, memory loss, substance abuse and suicidal ideas. The patient is not nervous/anxious and does not have insomnia.      Past Medical History:  Past Medical History:  Diagnosis Date  . Anxiety   . Arthritis   . Diverticulitis   . Family history of breast cancer   . Family history of leukemia   . GERD (gastroesophageal reflux disease)   . History of ischemic colitis 09/02/2017  . Hx of colonic polyp   . Hypertension    H/O-PT TAKEN OFF BY PCP AS OF 2019  . IBS (irritable bowel syndrome)   . Ovarian cancer ([place])   . Ovarian cyst   . Personal history of chemotherapy 2021   6 treatments  . Vaginal atrophy     Past Surgical History:  Past Surgical History:  Procedure  Laterality Date  . ABDOMINAL HYSTERECTOMY    . BREAST BIOPSY Left    needle bx/clip-neg  . CERVICAL BIOPSY  W/ LOOP ELECTRODE EXCISION    . CHOLECYSTECTOMY    . Colonoscopoy  2007, 2008, 2011  . COLONOSCOPY WITH PROPOFOL N/A 05/05/2018   Procedure: COLONOSCOPY WITH PROPOFOL;  Surgeon: Lin Landsman, MD;  Location: [facility] ENDOSCOPY;  Service: Gastroenterology;  Laterality: N/A;  . HYSTERECTOMY ABDOMINAL WITH SALPINGO-OOPHORECTOMY N/A 08/19/2019   Procedure: HYSTERECTOMY ABDOMINAL WITH SALPINGO-OOPHORECTOMY WITH PELVIC WASHINGS PERITONEAL BIOPSIES AND PARA AORTIC LYMPH NODE DISSECTION;  Surgeon: [name], MD;  Location: [facility] ORS;  Service: Gynecology;  Laterality: N/A;  . LEEP    . OMENTECTOMY N/A 08/19/2019   Procedure: OMENTECTOMY;  Surgeon: [name], MD;  Location: [facility] ORS;  Service: Gynecology;  Laterality: N/A;  . OOPHORECTOMY    . PORTA CATH INSERTION N/A 09/11/2019   Procedure: PORTA CATH INSERTION;  Surgeon: Algernon Huxley, MD;  Location: [place] CV LAB;  Service: Cardiovascular;  Laterality: N/A;  . [place]    Gynecologic History: No LMP recorded. Patient has had a hysterectomy.  Obstetric History: G0P0000  Family History:  Family History  Problem Relation Age of Onset  . Heart failure Mother   . Atrial fibrillation Mother   . Hypertension Mother   . Diabetes Father   . Heart attack Father   . Breast cancer Maternal Aunt 70  . Breast cancer Maternal Grandmother 80  .  Breast cancer Cousin        2 mat cousins  . Hypertension Sister   . Diabetes Sister   . Blindness Sister   . Hypertension Sister   . Arthritis Sister   . Leukemia Paternal Aunt   . Leukemia Paternal Uncle   . Brain cancer Paternal Aunt   . Other Brother 13       Drowning accident  . Liver cancer Maternal Uncle   . Alcohol abuse Maternal Uncle   . Prostate cancer Neg Hx   . Kidney cancer Neg Hx   . BRCA 1/2 Neg Hx     Social History:  Social History    Socioeconomic History  . Marital status: Single    Spouse name: Not on file  . Number of children: 0  . Years of education: Not on file  . Highest education level: Associate degree: academic program  Occupational History  . Occupation: Retired  Tobacco Use  . Smoking status: Never Smoker  . Smokeless tobacco: Never Used  . Tobacco comment: 2bd hand smoke from father  Vaping Use  . Vaping Use: Never used  Substance and Sexual Activity  . Alcohol use: Yes    Comment: rarely  . Drug use: No  . Sexual activity: Never    Birth control/protection: Post-menopausal  Other Topics Concern  . Not on file  Social History Narrative  . Not on file   Social Determinants of Health   Financial Resource Strain: Not on file  Food Insecurity: Not on file  Transportation Needs: Not on file  Physical Activity: Not on file  Stress: Not on file  Social Connections: Not on file  Intimate Partner Violence: Not on file    Allergies:  Allergies  Allergen Reactions  . Citalopram Nausea And Vomiting  . Ivp Dye [Iodinated Diagnostic Agents] Hives and Swelling    hives  . Meloxicam     unknown  . Adhesive [Tape] Rash  . Amlodipine Rash  . Ampicillin Rash    Did it involve swelling of the face/tongue/throat, SOB, or low BP? No Did it involve sudden or severe rash/hives, skin peeling, or any reaction on the inside of your mouth or nose? Yes Did you need to seek medical attention at a hospital or doctor's office? Yes When did it last happen? 10 + years If all above answers are "NO", may proceed with cephalosporin use.  . Cefoxitin Rash  . Estradiol Rash  . Estropipate Rash  . Olmesartan Medoxomil-Hctz Rash  . Sertraline Rash       . Sulfa Antibiotics Rash    Medications: Prior to Admission medications   Medication Sig Start Date End Date Taking? Authorizing Provider  lisinopril (ZESTRIL) 5 MG tablet TAKE 1 TABLET BY MOUTH EVERY DAY 09/28/20  Yes Juline Patch, MD  loratadine  (CLARITIN) 10 MG tablet Take 10 mg by mouth daily. otc   Yes [provider]  LORazepam (ATIVAN) 0.5 MG tablet TAKE 1 TABLET (0.5 MG TOTAL) BY MOUTH EVERY 6 (SIX) HOURS AS NEEDED (NAUSEA/VOMITING OR ANXIETY). 05/19/20  Yes Verlon Au, NP  mirabegron ER (MYRBETRIQ) 50 MG TB24 tablet Take 1 tablet (50 mg total) by mouth daily. 05/09/20  Yes Vaillancourt, Samantha, PA-C  pregabalin (LYRICA) 75 MG capsule Take 1 capsule (75 mg total) by mouth 2 (two) times daily. 06/22/20  Yes Sindy Guadeloupe, MD  acetaminophen (TYLENOL) 500 MG tablet Take 1,000 mg by mouth every 6 (six) hours as needed (for pain.).  [provider]  fluticasone (FLONASE) 50 MCG/ACT nasal spray Place 1 spray into both nostrils daily. otc Patient not taking: No sig reported    [provider]  venlafaxine XR (EFFEXOR-XR) 37.5 MG 24 hr capsule Take 1 capsule (37.5 mg total) by mouth daily with breakfast. 11/17/20   Sindy Guadeloupe, MD    Physical Exam Vitals:  Vitals:   12/14/20 1012  BP: (!) 152/86   No LMP recorded. Patient has had a hysterectomy.  General: NAD HEENT: normocephalic, anicteric Pulmonary: No increased work of breathing Abdomen: soft, non-tender, non-distended. Genitourinary:  External: Normal external female genitalia.  Normal urethral meatus, normal  Bartholin's and Skene's glands.    Vagina: Normal vaginal mucosa, no evidence of prolapse.    Cervix: surgically absent  Uterus: surgically absent  Adnexa: ovaries non-enlarged, no adnexal masses  Rectal: deferred  Lymphatic: no evidence of inguinal lymphadenopathy Extremities: no edema, erythema, or tenderness Neurologic: Grossly intact Psychiatric: mood appropriate, affect full  Female chaperone present for pelvic  portions of the physical exam  Assessment: 75 y.o. G0P0000 follow up pelvic exam for ovarian cancer  Plan: Problem List Items Addressed This Visit      Endocrine   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HCC)    Relevant Medications   nitrofurantoin, macrocrystal-monohydrate, (MACROBID) 100 MG capsule    Other Visit Diagnoses    Urinary tract infection without hematuria, site unspecified    -  Primary   Relevant Medications   nitrofurantoin, macrocrystal-monohydrate, (MACROBID) 100 MG capsule   Other Relevant Orders   Urine Culture   Dysuria       Relevant Orders   Urine Culture   Frequency of micturition       Relevant Orders   Urine Culture     1) Ovarian cancer surveillance - normal pelvic exam today with no concerning symptoms reported  2) Dysuria  - Rx macrobid - UCx sent  3) A total of 15 minutes were spent in face-to-face contact with the patient during this encounter with over half of that time devoted to counseling and coordination of care.  4) Return in about 8 months (around 08/16/2021) for Pelvic exam.   [name], MD, [place], [place] 12/14/2020, 10:26 AM

## 2020-12-15 ENCOUNTER — Other Ambulatory Visit: Payer: Self-pay | Admitting: Oncology

## 2020-12-19 LAB — URINE CULTURE

## 2020-12-20 ENCOUNTER — Other Ambulatory Visit: Payer: Self-pay | Admitting: Obstetrics and Gynecology

## 2020-12-20 DIAGNOSIS — N390 Urinary tract infection, site not specified: Secondary | ICD-10-CM

## 2020-12-20 DIAGNOSIS — R300 Dysuria: Secondary | ICD-10-CM

## 2020-12-20 NOTE — Telephone Encounter (Signed)
Urine culture order is in

## 2020-12-21 ENCOUNTER — Ambulatory Visit (INDEPENDENT_AMBULATORY_CARE_PROVIDER_SITE_OTHER): Payer: Medicare Other

## 2020-12-21 ENCOUNTER — Other Ambulatory Visit: Payer: Self-pay

## 2020-12-21 ENCOUNTER — Other Ambulatory Visit: Payer: Self-pay | Admitting: Oncology

## 2020-12-21 DIAGNOSIS — R300 Dysuria: Secondary | ICD-10-CM

## 2020-12-21 DIAGNOSIS — N390 Urinary tract infection, site not specified: Secondary | ICD-10-CM | POA: Diagnosis not present

## 2020-12-21 LAB — POCT URINALYSIS DIPSTICK
Bilirubin, UA: NEGATIVE
Blood, UA: NEGATIVE
Glucose, UA: NEGATIVE
Ketones, UA: NEGATIVE
Nitrite, UA: NEGATIVE
Protein, UA: POSITIVE — AB
Spec Grav, UA: 1.025 (ref 1.010–1.025)
Urobilinogen, UA: 0.2 E.U./dL
pH, UA: 5 (ref 5.0–8.0)

## 2020-12-21 NOTE — Progress Notes (Signed)
Pt dropped off urine specimen.  Sent for culture as per AMS; u/a also done for TOC.

## 2020-12-23 LAB — URINE CULTURE

## 2020-12-26 ENCOUNTER — Ambulatory Visit (INDEPENDENT_AMBULATORY_CARE_PROVIDER_SITE_OTHER): Payer: Medicare Other | Admitting: Physician Assistant

## 2020-12-26 ENCOUNTER — Encounter: Payer: Self-pay | Admitting: Physician Assistant

## 2020-12-26 ENCOUNTER — Telehealth: Payer: Self-pay | Admitting: Family Medicine

## 2020-12-26 ENCOUNTER — Other Ambulatory Visit: Payer: Self-pay

## 2020-12-26 VITALS — BP 131/72 | HR 84 | Ht 62.0 in | Wt 178.0 lb

## 2020-12-26 DIAGNOSIS — R350 Frequency of micturition: Secondary | ICD-10-CM

## 2020-12-26 DIAGNOSIS — R3129 Other microscopic hematuria: Secondary | ICD-10-CM

## 2020-12-26 LAB — BLADDER SCAN AMB NON-IMAGING: Scan Result: 0

## 2020-12-26 NOTE — Progress Notes (Signed)
12/26/2020 1:12 PM   Carrie Roberson May 31, 1946 [id number]  CC: Chief Complaint  Patient presents with  . Urinary Frequency   HPI: Carrie Roberson is a 75 y.o. female with PMH stage IC2 high-grade serous ovarian cancer s/p hysterectomy with salpingo-oophorectomy and chemo, IBS with diarrhea and constipation, and OAB wet with mixed incontinence on Myrbetriq 50 mg who presents today for evaluation of refractory urinary frequency.   She saw Carrie Roberson on 12/14/2020 for routine follow-up and reported some dysuria and urgency at that time.  Urine culture grew pansensitive E. coli and she was treated with culture appropriate Macrobid.  She reached back out to Carrie Roberson upon completion to report ongoing pressure and urgency and repeat culture grew mixed urogenital flora  Today she reports continued pressure, urgency, and frequency every 60 to 90 minutes despite Myrbetriq 50 mg.  She states her dysuria has resolved.  She notes diarrhea over the past several days.  She underwent CTAP without contrast on 02/16/2020 with no evidence of urolithiasis, hydronephrosis, or renal lesions.  In-office UA today positive for trace ketones, 1+ blood, trace protein, and trace leukocyte esterase; urine microscopy with 3-10 RBCs/HPF. PVR 63m.  PMH: Past Medical History:  Diagnosis Date  . Anxiety   . Arthritis   . Diverticulitis   . Family history of breast cancer   . Family history of leukemia   . GERD (gastroesophageal reflux disease)   . History of ischemic colitis 09/02/2017  . Hx of colonic polyp   . Hypertension    H/O-PT TAKEN OFF BY PCP AS OF 2019  . IBS (irritable bowel syndrome)   . [place]   . Ovarian cyst   . Personal history of chemotherapy 2021   6 treatments  . Vaginal atrophy     Surgical History: Past Surgical History:  Procedure Laterality Date  . ABDOMINAL HYSTERECTOMY    . BREAST BIOPSY Left    needle bx/clip-neg  . CERVICAL BIOPSY  W/ LOOP ELECTRODE EXCISION    .  CHOLECYSTECTOMY    . Colonoscopoy  2007, 2008, 2011  . COLONOSCOPY WITH PROPOFOL N/A 05/05/2018   Procedure: COLONOSCOPY WITH PROPOFOL;  Surgeon: VLin Landsman MD;  [facility];  Service: Gastroenterology;  Laterality: N/A;  . HYSTERECTOMY ABDOMINAL WITH SALPINGO-OOPHORECTOMY N/A 08/19/2019   Procedure: HYSTERECTOMY ABDOMINAL WITH SALPINGO-OOPHORECTOMY WITH PELVIC WASHINGS PERITONEAL BIOPSIES AND PARA AORTIC LYMPH NODE DISSECTION;  Surgeon: SMalachy Mood MD;  Location: [facility] ORS;  Service: Gynecology;  Laterality: N/A;  . LEEP    . OMENTECTOMY N/A 08/19/2019   Procedure: OMENTECTOMY;  Surgeon: SMalachy Mood MD;  Location: [facility] ORS;  Service: Gynecology;  Laterality: N/A;  . OOPHORECTOMY    . PORTA CATH INSERTION N/A 09/11/2019   Procedure: PORTA CATH INSERTION;  Surgeon: DAlgernon Huxley MD;  [facility];  Service: Cardiovascular;  Laterality: N/A;  . TUBAL LIGATION  1999    Home Medications:  Allergies as of 12/26/2020      Reactions   Citalopram Nausea And Vomiting   Ivp Dye [iodinated Diagnostic Agents] Hives, Swelling   hives   Meloxicam    unknown   Adhesive [tape] Rash   Amlodipine Rash   Ampicillin Rash   Did it involve swelling of the face/tongue/throat, SOB, or low BP? No Did it involve sudden or severe rash/hives, skin peeling, or any reaction on the inside of your mouth or nose? Yes Did you need to seek medical attention at a hospital or doctor's  office? Yes When did it last happen? 10 + years If all above answers are "NO", may proceed with cephalosporin use.   Cefoxitin Rash   Estradiol Rash   Estropipate Rash   Olmesartan Medoxomil-hctz Rash   Sertraline Rash      Sulfa Antibiotics Rash      Medication List       Accurate as of December 26, 2020  1:12 PM. If you have any questions, ask your nurse or doctor.        acetaminophen 500 MG tablet Commonly known as: TYLENOL Take 1,000 mg by mouth every 6 (six) hours as  needed (for pain.).   fluticasone 50 MCG/ACT nasal spray Commonly known as: FLONASE Place 1 spray into both nostrils daily. otc   lisinopril 5 MG tablet Commonly known as: ZESTRIL TAKE 1 TABLET BY MOUTH EVERY DAY   loratadine 10 MG tablet Commonly known as: CLARITIN Take 10 mg by mouth daily. otc   LORazepam 0.5 MG tablet Commonly known as: ATIVAN TAKE 1 TABLET (0.5 MG TOTAL) BY MOUTH EVERY 6 (SIX) HOURS AS NEEDED (NAUSEA/VOMITING OR ANXIETY).   mirabegron ER 50 MG Tb24 tablet Commonly known as: MYRBETRIQ Take 1 tablet (50 mg total) by mouth daily.   pregabalin 75 MG capsule Commonly known as: Lyrica Take 1 capsule (75 mg total) by mouth 2 (two) times daily.   venlafaxine XR 37.5 MG 24 hr capsule Commonly known as: EFFEXOR-XR TAKE 1 CAPSULE BY MOUTH DAILY WITH BREAKFAST.       Allergies:  Allergies  Allergen Reactions  . Citalopram Nausea And Vomiting  . Ivp Dye [Iodinated Diagnostic Agents] Hives and Swelling    hives  . Meloxicam     unknown  . Adhesive [Tape] Rash  . Amlodipine Rash  . Ampicillin Rash    Did it involve swelling of the face/tongue/throat, SOB, or low BP? No Did it involve sudden or severe rash/hives, skin peeling, or any reaction on the inside of your mouth or nose? Yes Did you need to seek medical attention at a hospital or doctor's office? Yes When did it last happen? 10 + years If all above answers are "NO", may proceed with cephalosporin use.  . Cefoxitin Rash  . Estradiol Rash  . Estropipate Rash  . Olmesartan Medoxomil-Hctz Rash  . Sertraline Rash       . Sulfa Antibiotics Rash    Family History: Family History  Problem Relation Age of Onset  . Heart failure Mother   . Atrial fibrillation Mother   . Hypertension Mother   . Diabetes Father   . Heart attack Father   . Breast cancer Maternal Aunt 70  . Breast cancer Maternal Grandmother 80  . Breast cancer Cousin        2 mat cousins  . Hypertension Sister   .  Diabetes Sister   . Blindness Sister   . Hypertension Sister   . Arthritis Sister   . Leukemia Paternal Aunt   . Leukemia Paternal Uncle   . Brain cancer Paternal Aunt   . Other Brother 13       Drowning accident  . Liver cancer Maternal Uncle   . Alcohol abuse Maternal Uncle   . Prostate cancer Neg Hx   . Kidney cancer Neg Hx   . BRCA 1/2 Neg Hx     Social History:   reports that she has never smoked. She has never used smokeless tobacco. She reports current alcohol use. She reports that she does  not use drugs.  Physical Exam: BP 131/72   Pulse 84   Ht 5' 2" (1.575 m)   Wt 178 lb (80.7 kg)   BMI 32.56 kg/m   Constitutional:  Alert and oriented, no acute distress, nontoxic appearing HEENT: [place], AT Cardiovascular: No clubbing, cyanosis, or edema Respiratory: Normal respiratory effort, no increased work of breathing Skin: No rashes, bruises or suspicious lesions Neurologic: Grossly intact, no focal deficits, moving all 4 extremities Psychiatric: Normal mood and affect  Laboratory Data: Results for orders placed or performed in visit on 12/26/20  Microscopic Examination   Urine  Result Value Ref Range   WBC, UA 0-5 0 - 5 /hpf   RBC 3-10 (A) 0 - 2 /hpf   Epithelial Cells (non renal) 0-10 0 - 10 /hpf   Renal Epithel, UA 0-10 (A) None seen /hpf   Bacteria, UA Few None seen/Few  Urinalysis, Complete  Result Value Ref Range   Specific Gravity, UA 1.020 1.005 - 1.030   pH, UA 5.5 5.0 - 7.5   Color, UA Yellow Yellow   Appearance Ur Cloudy (A) Clear   Leukocytes,UA Trace (A) Negative   Protein,UA Trace (A) Negative/Trace   Glucose, UA Negative Negative   Ketones, UA Trace (A) Negative   RBC, UA 1+ (A) Negative   Bilirubin, UA Negative Negative   Urobilinogen, Ur 0.2 0.2 - 1.0 mg/dL   Nitrite, UA Negative Negative   Microscopic Examination See below:   Bladder Scan (Post Void Residual) in office  Result Value Ref Range   Scan Result 0    Assessment & Plan:   1.  Urinary frequency Increased frequency, urgency, and pressure after a Roberson of culture appropriate antibiotics with prove resolution.  We discussed that the differential today includes persistent bladder irritation versus recurrent infection versus other pathology.  We will repeat typical and atypical culture today. - Urinalysis, Complete - Bladder Scan (Post Void Residual) in office - CULTURE, URINE COMPREHENSIVE - Mycoplasma / ureaplasma culture  2. Microscopic hematuria With new findings of microscopic hematuria, I recommend cystoscopy for further evaluation at this time.  Unfortunately CTU cannot be performed given the ongoing contrast shortage, however her noncontrast CT from less than 1 year ago had no evidence of urinary pathology.  If cystoscopy is positive, can plan for bilateral retrogrades in the OR.  Return for Cystoscopy with Dr. Diamantina Providence.  Debroah Loop, PA-C  [facility] [address] [place], [place] [address] [phone]

## 2020-12-26 NOTE — Telephone Encounter (Signed)
Patient called stating she is having urgency and Myrbetriq is not working anymore.

## 2020-12-26 NOTE — Patient Instructions (Signed)
Cystoscopy Cystoscopy is a procedure that is used to help diagnose and sometimes treat conditions that affect the lower urinary tract. The lower urinary tract includes the bladder and the urethra. The urethra is the tube that drains urine from the bladder. Cystoscopy is done using a thin, tube-shaped instrument with a light and camera at the end (cystoscope). The cystoscope may be hard or flexible, depending on the goal of the procedure. The cystoscope is inserted through the urethra, into the bladder. Cystoscopy may be recommended if you have:  Urinary tract infections that keep coming back.  Blood in the urine (hematuria).  An inability to control when you urinate (urinary incontinence) or an overactive bladder.  Unusual cells found in a urine sample.  A blockage in the urethra, such as a urinary stone.  Painful urination.  An abnormality in the bladder found during an intravenous pyelogram (IVP) or CT scan. Cystoscopy may also be done to remove a sample of tissue to be examined under a microscope (biopsy). Tell a health care provider about:  Any allergies you have.  All medicines you are taking, including vitamins, herbs, eye drops, creams, and over-the-counter medicines.  Any problems you or family members have had with anesthetic medicines.  Any blood disorders you have.  Any surgeries you have had.  Any medical conditions you have.  Whether you are pregnant or may be pregnant. What are the risks? Generally, this is a safe procedure. However, problems may occur, including:  Infection.  Bleeding.  Allergic reactions to medicines.  Damage to other structures or organs. What happens before the procedure? Medicines Ask your health care provider about:  Changing or stopping your regular medicines. This is especially important if you are taking diabetes medicines or blood thinners.  Taking medicines such as aspirin and ibuprofen. These medicines can thin your blood. Do  not take these medicines unless your health care provider tells you to take them.  Taking over-the-counter medicines, vitamins, herbs, and supplements. Tests You may have an exam or testing, such as:  X-rays of the bladder, urethra, or kidneys.  CT scan of the abdomen or pelvis.  Urine tests to check for signs of infection. General instructions  Follow instructions from your health care provider about eating or drinking restrictions.  Ask your health care provider what steps will be taken to help prevent infection. These steps may include: ? Washing skin with a germ-killing soap. ? Taking antibiotic medicine.  Plan to have a responsible adult take you home from the hospital or clinic. What happens during the procedure?  You will be given one or more of the following: ? A medicine to help you relax (sedative). ? A medicine to numb the area (local anesthetic).  The area around the opening of your urethra will be cleaned.  The cystoscope will be passed through your urethra into your bladder.  Germ-free (sterile) fluid will flow through the cystoscope to fill your bladder. The fluid will stretch your bladder so that your health care provider can clearly examine your bladder walls.  Your doctor will look at the urethra and bladder. Your doctor may take a biopsy or remove stones.  The cystoscope will be removed, and your bladder will be emptied. The procedure may vary among health care providers and hospitals.   What can I expect after the procedure? After the procedure, it is common to have:  Some soreness or pain in your abdomen and urethra.  Urinary symptoms. These include: ? Mild pain or burning  when you urinate. Pain should stop within a few minutes after you urinate. This may last for up to 1 week. ? A small amount of blood in your urine for several days. ? Feeling like you need to urinate but producing only a small amount of urine. Follow these instructions at  home: Medicines  Take over-the-counter and prescription medicines only as told by your health care provider.  If you were prescribed an antibiotic medicine, take it as told by your health care provider. Do not stop taking the antibiotic even if you start to feel better. General instructions  Return to your normal activities as told by your health care provider. Ask your health care provider what activities are safe for you.  If you were given a sedative during the procedure, it can affect you for several hours. Do not drive or operate machinery until your health care provider says that it is safe.  Watch for any blood in your urine. If the amount of blood in your urine increases, call your health care provider.  Follow instructions from your health care provider about eating or drinking restrictions.  If a tissue sample was removed for testing (biopsy) during your procedure, it is up to you to get your test results. Ask your health care provider, or the department that is doing the test, when your results will be ready.  Drink enough fluid to keep your urine pale yellow.  Keep all follow-up visits. This is important. Contact a health care provider if:  You have pain that gets worse or does not get better with medicine, especially pain when you urinate.  You have trouble urinating.  You have more blood in your urine. Get help right away if:  You have blood clots in your urine.  You have abdominal pain.  You have a fever or chills.  You are unable to urinate. Summary  Cystoscopy is a procedure that is used to help diagnose and sometimes treat conditions that affect the lower urinary tract.  Cystoscopy is done using a thin, tube-shaped instrument with a light and camera at the end.  After the procedure, it is common to have some soreness or pain in your abdomen and urethra.  Watch for any blood in your urine. If the amount of blood in your urine increases, call your health  care provider.  If you were prescribed an antibiotic medicine, take it as told by your health care provider. Do not stop taking the antibiotic even if you start to feel better. This information is not intended to replace advice given to you by your health care provider. Make sure you discuss any questions you have with your health care provider. Document Revised: 02/19/2020 Document Reviewed: 02/19/2020 Elsevier Patient Education  [place].

## 2020-12-26 NOTE — Telephone Encounter (Signed)
Patient scheduled 12/21/20 for UA drop

## 2020-12-27 ENCOUNTER — Telehealth: Payer: Self-pay | Admitting: *Deleted

## 2020-12-27 LAB — URINALYSIS, COMPLETE
Bilirubin, UA: NEGATIVE
Glucose, UA: NEGATIVE
Nitrite, UA: NEGATIVE
Specific Gravity, UA: 1.02 (ref 1.005–1.030)
Urobilinogen, Ur: 0.2 mg/dL (ref 0.2–1.0)
pH, UA: 5.5 (ref 5.0–7.5)

## 2020-12-27 LAB — MICROSCOPIC EXAMINATION

## 2020-12-27 NOTE — Telephone Encounter (Signed)
Patient called in this morning asking about her urine test that was done yesterday . She states she is burning more day than yesterday. Advised her to take over the counter AZO. We will call with results from culture .

## 2020-12-30 NOTE — Telephone Encounter (Addendum)
Patient called concerned about ongoing dysuria and pressure-has been taking AZO for three days. Culture sill pending-patient aware. Is there anything else she can take time? Has been staying hyrdated

## 2020-12-30 NOTE — Telephone Encounter (Signed)
Notified patient as advised, patient expressed understanding.  

## 2020-12-30 NOTE — Telephone Encounter (Signed)
Culture isn't growing anything clinically significant so far; possible it will finalize negative. Keep taking Azo, stay hydrated, and keep upcoming cysto appointment.

## 2020-12-31 LAB — CULTURE, URINE COMPREHENSIVE

## 2021-01-02 ENCOUNTER — Telehealth: Payer: Self-pay

## 2021-01-02 LAB — MYCOPLASMA / UREAPLASMA CULTURE
Mycoplasma hominis Culture: NEGATIVE
Ureaplasma urealyticum: NEGATIVE

## 2021-01-02 NOTE — Telephone Encounter (Signed)
-----   Message from Debroah Loop, [place] sent at 01/02/2021 10:18 AM EDT ----- Standard and atypical cultures all negative for infection. Please keep upcoming cysto appointment for further evaluation. ----- Message ----- From: Interface, [facility] Lab Results In Sent: 12/27/2020   5:38 AM EDT To: Debroah Loop, PA-C

## 2021-01-02 NOTE — Telephone Encounter (Signed)
Notified patient as advised, patient expressed understanding.  

## 2021-01-06 ENCOUNTER — Encounter: Payer: Self-pay | Admitting: Nurse Practitioner

## 2021-01-10 ENCOUNTER — Other Ambulatory Visit: Payer: Self-pay | Admitting: *Deleted

## 2021-01-10 MED ORDER — PREGABALIN 75 MG PO CAPS: 75.0000 mg | ORAL_CAPSULE | Freq: Two times a day (BID) | ORAL | 3 refills | Status: DC

## 2021-01-11 ENCOUNTER — Ambulatory Visit (INDEPENDENT_AMBULATORY_CARE_PROVIDER_SITE_OTHER): Payer: Medicare Other | Admitting: Urology

## 2021-01-11 ENCOUNTER — Other Ambulatory Visit: Payer: Self-pay

## 2021-01-11 ENCOUNTER — Encounter: Payer: Self-pay | Admitting: Urology

## 2021-01-11 VITALS — BP 164/89 | HR 74 | Ht 63.0 in | Wt 178.0 lb

## 2021-01-11 DIAGNOSIS — N390 Urinary tract infection, site not specified: Secondary | ICD-10-CM

## 2021-01-11 DIAGNOSIS — R350 Frequency of micturition: Secondary | ICD-10-CM

## 2021-01-11 DIAGNOSIS — N3281 Overactive bladder: Secondary | ICD-10-CM

## 2021-01-11 NOTE — Progress Notes (Signed)
Cystoscopy Procedure Note:  Indication: Persistent urinary symptoms despite negative cultures  Was having persistent urgency/frequency/pelvic pressure after recent UTI despite negative cultures.  Fortunately her symptoms have resolved over the last week or so and she denies any urinary complaints today.  Urinalysis today is completely benign  After informed consent and discussion of the procedure and its risks, Carrie Roberson was positioned and prepped in the standard fashion. Cystoscopy was performed with a flexible cystoscope. The urethra, bladder neck and entire bladder was visualized in a standard fashion. The ureteral orifices were visualized in their normal location and orientation.  No abnormalities on retroflexion, bladder mucosa grossly normal throughout.   Findings: Normal cystoscopy  Assessment and Plan: Suspect she had some persistent bladder inflammation and pain after UTI that has now resolved  Continue Myrbetriq for OAB  RTC with Sam 6 months symptom check  Nickolas Madrid, MD 01/11/2021

## 2021-01-12 LAB — URINALYSIS, COMPLETE
Bilirubin, UA: NEGATIVE
Glucose, UA: NEGATIVE
Ketones, UA: NEGATIVE
Leukocytes,UA: NEGATIVE
Nitrite, UA: NEGATIVE
Protein,UA: NEGATIVE
Specific Gravity, UA: 1.015 (ref 1.005–1.030)
Urobilinogen, Ur: 0.2 mg/dL (ref 0.2–1.0)
pH, UA: 6.5 (ref 5.0–7.5)

## 2021-01-12 LAB — MICROSCOPIC EXAMINATION: Bacteria, UA: NONE SEEN

## 2021-01-17 ENCOUNTER — Other Ambulatory Visit: Payer: Self-pay | Admitting: Obstetrics and Gynecology

## 2021-01-17 MED ORDER — FLUCONAZOLE 150 MG PO TABS: 150.0000 mg | ORAL_TABLET | Freq: Once | ORAL | 0 refills | Status: AC

## 2021-01-25 ENCOUNTER — Ambulatory Visit: Payer: Medicare Other | Admitting: Obstetrics and Gynecology

## 2021-02-16 ENCOUNTER — Other Ambulatory Visit: Payer: Self-pay

## 2021-02-16 ENCOUNTER — Inpatient Hospital Stay: Payer: Medicare Other | Attending: Oncology

## 2021-02-16 DIAGNOSIS — C561 Malignant neoplasm of right ovary: Secondary | ICD-10-CM | POA: Insufficient documentation

## 2021-02-16 DIAGNOSIS — Z452 Encounter for adjustment and management of vascular access device: Secondary | ICD-10-CM | POA: Insufficient documentation

## 2021-02-16 DIAGNOSIS — Z95828 Presence of other vascular implants and grafts: Secondary | ICD-10-CM

## 2021-02-16 MED ORDER — HEPARIN SOD (PORK) LOCK FLUSH 100 UNIT/ML IV SOLN
INTRAVENOUS | Status: AC
  Filled 2021-02-16: qty 5

## 2021-02-16 MED ORDER — SODIUM CHLORIDE 0.9% FLUSH
10.0000 mL | INTRAVENOUS | Status: DC | PRN
  Administered 2021-02-16: 10 mL via INTRAVENOUS
  Filled 2021-02-16: qty 10

## 2021-02-16 MED ORDER — HEPARIN SOD (PORK) LOCK FLUSH 100 UNIT/ML IV SOLN
500.0000 [IU] | Freq: Once | INTRAVENOUS | Status: AC
Start: 2021-02-16 — End: 2021-02-16
  Administered 2021-02-16: 500 [IU] via INTRAVENOUS
  Filled 2021-02-16: qty 5

## 2021-02-28 ENCOUNTER — Other Ambulatory Visit: Payer: Self-pay

## 2021-02-28 ENCOUNTER — Other Ambulatory Visit: Payer: Self-pay | Admitting: Obstetrics and Gynecology

## 2021-02-28 DIAGNOSIS — Z86010 Personal history of colonic polyps: Secondary | ICD-10-CM

## 2021-02-28 DIAGNOSIS — Z1231 Encounter for screening mammogram for malignant neoplasm of breast: Secondary | ICD-10-CM

## 2021-02-28 MED ORDER — CLENPIQ 10-3.5-12 MG-GM -GM/160ML PO SOLN: 320.0000 mL | Freq: Once | ORAL | 0 refills | Status: AC

## 2021-03-15 ENCOUNTER — Ambulatory Visit
Admission: RE | Admit: 2021-03-15 | Discharge: 2021-03-15 | Disposition: A | Discharge status: Home / Self Care | Payer: Medicare Other | Source: Ambulatory Visit | Attending: Obstetrics and Gynecology | Admitting: Obstetrics and Gynecology

## 2021-03-15 ENCOUNTER — Other Ambulatory Visit: Payer: Self-pay

## 2021-03-15 DIAGNOSIS — Z1231 Encounter for screening mammogram for malignant neoplasm of breast: Secondary | ICD-10-CM | POA: Insufficient documentation

## 2021-03-21 ENCOUNTER — Encounter: Payer: Self-pay | Admitting: Oncology

## 2021-03-25 ENCOUNTER — Other Ambulatory Visit: Payer: Self-pay | Admitting: Family Medicine

## 2021-03-25 DIAGNOSIS — I10 Essential (primary) hypertension: Secondary | ICD-10-CM

## 2021-03-25 NOTE — Telephone Encounter (Signed)
Requested medication (s) are due for refill today: yes  Requested medication (s) are on the active medication list: yes  Last refill:  09/28/20  Future visit scheduled: yes  Notes to clinic:  called pt and made appt for this Tuesday   Requested Prescriptions  Pending Prescriptions Disp Refills   lisinopril (ZESTRIL) 5 MG tablet [Pharmacy Med Name: LISINOPRIL 5 MG TABLET] 90 tablet 1    Sig: TAKE 1 TABLET BY MOUTH EVERY DAY     Cardiovascular:  ACE Inhibitors Failed - 03/25/2021 11:17 AM      Failed - Last BP in normal range    BP Readings from Last 1 Encounters:  01/11/21 (!) 164/89          Failed - Valid encounter within last 6 months    Recent Outpatient Visits           7 months ago Pigmented skin lesion   [facility] Juline Patch, MD   11 months ago Acute maxillary sinusitis, recurrence not specified   [facility] Juline Patch, MD   12 months ago Right foot pain   [facility] Juline Patch, MD   1 year ago Diverticulitis   [facility] Juline Patch, MD   2 years ago Acute cystitis without hematuria   [facility] Juline Patch, MD       Future Appointments             In 3 days Juline Patch, MD [facility], [place]   In 1 month Debroah Loop, [place] [facility]            Passed - Cr in normal range and within 180 days    Creatinine, Ser  Date Value Ref Range Status  11/17/2020 0.75 0.44 - 1.00 mg/dL Final          Passed - K in normal range and within 180 days    Potassium  Date Value Ref Range Status  11/17/2020 4.2 3.5 - 5.1 mmol/L Final          Passed - Patient is not pregnant

## 2021-03-28 ENCOUNTER — Ambulatory Visit: Payer: Self-pay | Admitting: *Deleted

## 2021-03-28 ENCOUNTER — Ambulatory Visit: Payer: Medicare Other | Admitting: Family Medicine

## 2021-03-28 NOTE — Telephone Encounter (Signed)
Reason for Disposition  [1] CLOSE CONTACT COVID-19 EXPOSURE within last 14 days AND [2] NO symptoms  Answer Assessment - Initial Assessment Questions 1. COVID-19 EXPOSURE: "Please describe how you were exposed to someone with a COVID-19 infection."     Around covid positive person during weekend  2. PLACE of CONTACT: "Where were you when you were exposed to COVID-19?" (e.g., home, school, medical waiting room; which city?)     Home  3. TYPE of CONTACT: "How much contact was there?" (e.g., sitting next to, live in same house, work in same office, same building)     Same house sitting next to . 4. DURATION of CONTACT: "How long were you in contact with the COVID-19 patient?" (e.g., a few seconds, passed by person, a few minutes, 15 minutes or longer, live with the patient)     All weekend  5. MASK: "Were you wearing a mask?" "Was the other person wearing a mask?" Note: wearing a mask reduces the risk of an otherwise close contact.     No  6. DATE of CONTACT: "When did you have contact with a COVID-19 patient?" (e.g., how many days ago)     9/3-03/26/21 7. COMMUNITY SPREAD: "Are there lots of cases of COVID-19 (community spread) where you live?" (See public health department website, if unsure)       Covid positive is 74 year old just started school 8. SYMPTOMS: "Do you have any symptoms?" (e.g., fever, cough, breathing difficulty, loss of taste or smell)     Denies  9. VACCINE: "Have you gotten the COVID-19 vaccine?" If Yes, ask: "Which one, how many shots, when did you get it?"     Yes  x 2 Pfizer  10. BOOSTER: "Have you received your COVID-19 booster?" If Yes, ask: "Which one and when did you get it?"       X 1 Pfizer  11. PREGNANCY OR POSTPARTUM: "Is there any chance you are pregnant?" "When was your last menstrual period?" "Did you deliver in the last 2 weeks?"       na 12. HIGH RISK: "Do you have any heart or lung problems?" (e.g., asthma , COPD, heart failure) "Do you have a weak immune  system or other risk factors?" (e.g., HIV positive, chemotherapy, renal failure, diabetes mellitus, sickle cell anemia, obesity)       Hx CA  13. TRAVEL: "Have you traveled out of the country recently?" If Yes, ask: "When and where?"  Note: Travel becomes less relevant if there is widespread community transmission where the patient lives.       na  Protocols used: Coronavirus ([id number]) Exposure-A-AH

## 2021-03-28 NOTE — Telephone Encounter (Signed)
Patient cancelled her appointment today due to being exposed over the weekend to Joseph, patient would like to discuss with a nurse when she should get tested  Called patient to review information regarding covid testing. Patient reports she cancelled appt today due to exposure to covid positive patient over the weekend 9/3-03/26/21. No symptoms noted at this time. Has been vaccinated and 1 booster pfizer. Hx CA x 1 year ago . Encouraged patient to monitor self for symptoms and test 5-7 days after exposure, after Wednesday 03/29/21 unless symptoms noted. Reviewed s/s of covid. Care advise given. Patient verbalized understanding of care advise and to call back if symptoms noted or positive for covid.

## 2021-03-28 NOTE — Telephone Encounter (Signed)
Noted  KP 

## 2021-03-30 ENCOUNTER — Ambulatory Visit (INDEPENDENT_AMBULATORY_CARE_PROVIDER_SITE_OTHER): Payer: Medicare Other | Admitting: Family Medicine

## 2021-03-30 ENCOUNTER — Ambulatory Visit: Payer: Medicare Other

## 2021-03-30 ENCOUNTER — Encounter: Payer: Self-pay | Admitting: Family Medicine

## 2021-03-30 ENCOUNTER — Ambulatory Visit: Payer: Medicare Other | Admitting: Family Medicine

## 2021-03-30 ENCOUNTER — Telehealth: Payer: Self-pay | Admitting: Family Medicine

## 2021-03-30 DIAGNOSIS — U071 COVID-19: Secondary | ICD-10-CM

## 2021-03-30 MED ORDER — MOLNUPIRAVIR EUA 200MG CAPSULE: 4.0000 | ORAL_CAPSULE | Freq: Two times a day (BID) | ORAL | 0 refills | Status: AC

## 2021-03-30 NOTE — Progress Notes (Signed)
Date:  03/30/2021   Name:  Carrie Roberson   DOB:  03-Aug-1945   MRN:  [id number]   Chief Complaint: No chief complaint on file.  I Otilio Miu, MD at my office connected with this patient, Carrie Roberson, by telephone at the patient's home.  I verified that I am speaking with the correct person using two identifiers. This visit was conducted via telephone due to the Covid-19 outbreak from my office at [facility] in [place], [place]. I discussed the limitations, risks, security and privacy concerns of performing an evaluation and management service by telephone. I also discussed with the patient that there may be a patient responsible charge related to this service. The patient expressed understanding and agreed to proceed.    Fever  This is a new problem. The current episode started yesterday. The problem has been waxing and waning. Associated symptoms include coughing, headaches and nausea. Pertinent negatives include no congestion, diarrhea, muscle aches or sore throat. Associated symptoms comments: Reflux/rhinorrhea.   Lab Results  Component Value Date   CREATININE 0.75 11/17/2020   BUN 14 11/17/2020   NA 136 11/17/2020   K 4.2 11/17/2020   CL 104 11/17/2020   CO2 24 11/17/2020   Lab Results  Component Value Date   CHOL 188 09/02/2017   HDL 53 09/02/2017   LDLCALC 109 (H) 09/02/2017   TRIG 131 09/02/2017   CHOLHDL 3.5 09/02/2017   Lab Results  Component Value Date   TSH 0.856 09/02/2017   No results found for: HGBA1C Lab Results  Component Value Date   WBC 4.1 11/17/2020   HGB 14.6 11/17/2020   HCT 43.6 11/17/2020   MCV 92.0 11/17/2020   PLT 226 11/17/2020   Lab Results  Component Value Date   ALT 21 11/17/2020   AST 18 11/17/2020   ALKPHOS 86 11/17/2020   BILITOT 0.5 11/17/2020     Review of Systems  Constitutional:  Positive for fever.  HENT:  Negative for congestion and sore throat.   Respiratory:  Positive for cough.   Gastrointestinal:  Positive for  nausea. Negative for diarrhea.  Neurological:  Positive for headaches.   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 10/13/2019   Iron deficiency 10/02/2019   Anxiety associated with cancer diagnosis ([place]) 10/02/2019   Chemotherapy-induced nausea 10/02/2019   Thyroid nodule 10/02/2019   Hyponatremia 10/02/2019   B12 deficiency 09/18/2019   Family history of breast cancer    Family history of leukemia    Goals of care, counseling/discussion 09/07/2019   Ovarian mass 08/19/2019   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) 08/19/2019   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HCC)    Cyst of ovary 07/15/2019   Anxiety 09/02/2017   DDD (degenerative disc disease), 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 No Did it involve sudden or severe rash/hives, skin peeling, or any reaction on the inside of your mouth or nose? Yes Did you need to seek medical attention at a hospital or doctor's office? Yes When did it last happen?       10 + years If all above answers are "NO", may proceed with cephalosporin use.    Cefoxitin Rash   Estradiol Rash   Estropipate Rash   Olmesartan Medoxomil-Hctz Rash   Sertraline Rash        Sulfa Antibiotics Rash    Past Surgical History:  Procedure Laterality Date   ABDOMINAL HYSTERECTOMY     BREAST BIOPSY Left    needle bx/clip-neg   CERVICAL BIOPSY  W/ LOOP ELECTRODE EXCISION     CHOLECYSTECTOMY     Colonoscopoy  2007, 2008, 2011   COLONOSCOPY WITH PROPOFOL N/A 05/05/2018   Procedure: COLONOSCOPY WITH PROPOFOL;  Surgeon: Lin Landsman, MD;  Location: [facility] ENDOSCOPY;  Service: Gastroenterology;  Laterality: N/A;   HYSTERECTOMY ABDOMINAL WITH SALPINGO-OOPHORECTOMY N/A 08/19/2019   Procedure: HYSTERECTOMY ABDOMINAL WITH SALPINGO-OOPHORECTOMY WITH PELVIC WASHINGS PERITONEAL BIOPSIES AND PARA AORTIC LYMPH NODE DISSECTION;  Surgeon: [name], MD;  Location: [facility] ORS;  Service: Gynecology;  Laterality: N/A;   LEEP     OMENTECTOMY N/A 08/19/2019   Procedure: OMENTECTOMY;  Surgeon: [name], MD;  Location: [facility] ORS;  Service: Gynecology;  Laterality: N/A;   OOPHORECTOMY     PORTA CATH INSERTION N/A 09/11/2019   Procedure: PORTA CATH INSERTION;  Surgeon: Algernon Huxley, MD;  Location: [place];  Service: Cardiovascular;  Laterality: N/A;   TUBAL LIGATION  1999    Social History   Tobacco Use   Smoking status: Never   Smokeless tobacco: Never   Tobacco comments:    2bd hand smoke from father  Vaping Use   Vaping Use: Never used  Substance Use Topics   Alcohol use: Yes    Comment: rarely   Drug use: No     Medication list has been reviewed and updated.  No outpatient medications have been marked as taking for the 03/30/21 encounter (Office Visit) with Juline Patch, MD.    [facility] 2/9 Scores 08/11/2020 03/29/2020 02/23/2020 03/23/2019  PHQ - 2 Score 0 '2 2 3  '$ PHQ- 9 Score 0 '3 8 9    '$ GAD 7 : Generalized Anxiety Score 03/29/2020 02/23/2020 03/23/2019  Nervous, Anxious, on Edge '1 1 1  '$ Control/stop worrying 0 0 0  Worry too much -  different things 0 0 2  Trouble relaxing 0 0 0  Restless 0 0 0  Easily annoyed or irritable 1 0 0  Afraid - awful might happen 0 0 1  Total GAD 7 Score '2 1 4  '$ Anxiety Difficulty Not difficult at all Not difficult at all Somewhat difficult    BP Readings from Last 3 Encounters:  01/11/21 (!) 164/89  12/26/20 131/72  12/14/20 (!) 152/86    Physical Exam  Wt Readings from Last 3 Encounters:  01/11/21 178 lb (80.7 kg)  12/26/20 178 lb (80.7 kg)  12/14/20 178 lb (80.7 kg)    There were no vitals taken for this visit.  Assessment and Plan:  1. COVID New onset.  Persistent.  Stable.  Patient is having symptoms but none  of the concerning signs and symptoms necessitating hospital or urgent care for further evaluation at this time.  We have discussed fluids and acetaminophen as well as initiating molnupiavir 200 mg 4 capsules twice a day for 5 days.  Information has been discussed if further evaluation is can be necessitated. - molnupiravir EUA 200 mg CAPS; Take 4 capsules (800 mg total) by mouth 2 (two) times daily for 5 days.  Dispense: 40 capsule; Refill: 0   I spent 10 minutes with this patient, More than 50% of that time was spent in face to face education, counseling and care coordination.

## 2021-03-30 NOTE — Patient Instructions (Signed)
COVID-19: O que fazer se Location manager doente COVID-19: What to Do if You Are Sick Se voc tiver febre, tosse ou outros sintomas, voc pode estar com COVID-19. A [employer] tem uma doena leve e consegue se recuperar em casa. Se voc estiver doente: Preste ateno  [employer] seus sintomas. Se voc tiver um sinal de alerta de emergncia (incluindo dificuldade para respirar), ligue para 911. Passos para ajudar a prevenir a Radio producer doente Se voc estiver doente com COVID-19 ou achar que pode estar com COVID-19, siga os passos abaixo para cuidar de si e ajudar a proteger outras pessoas na sua casa e em sua comunidade. Fique em casa, exceto para buscar atendimento mdico Fique em casa. A maioria das pessoas com COVID-19 tem uma doena leve e pode se recuperar em casa sem tratamento mdico. No saia de casa, exceto para buscar atendimento mdico. No visite reas pblicas. Cuide de si mesmo. Repouse e mantenha-se hidratado. Tome medicamentos vendidos sem receita, como acetaminofeno, para ajud-lo a se Warden/ranger. Mantenha contato com seu mdico. Telefone antes de sair para buscar atendimento mdico. No deixe de procurar atendimento se tiver dificuldade para respirar ou se tiver outros sinais de Hydrographic surveyor de Careers information officer ou se voc achar que se trata de Scientist, physiological. Evite transporte pblico, carros compartilhados ou txis. Isole-se de outras pessoas Na medida do possvel, fique em um quarto exclusivo e longe de outras pessoas e animais de estimao em sua casa. Se possvel, voc deve usar um banheiro separado. Caso precise ficar prximo de [employer] ou animais dentro ou fora de [place], use [place]. Avise a seus contatos prximosque eles podem ter sido expostos  COVID-19. Uma pessoa infectada pode disseminar a COVID-19 a partir de 54 horas (ou 2 dias) antes de apresentar qualquer sintoma ou teste positivo. Ao informar seus contatos prximos que eles podem ter  sido expostos  COVID-19, voc est ajudando a proteger a todos. Mais orientaes esto disponveis para pessoas que vivem em ambientes confinados e habitao compartilhada. Consulte COVID-19 e Animais se tiver dvidas sobre animais de [place]. Se voc for diagnosticado com COVID-19, algum da secretaria de sade pode ligar para voc. Atenda a chamada para diminuir a velocidade de propagao. Monitore seus sintomas Os sintomas da COVID-19 incluem febre, tosse ou outros sintomas. Siga as instrues de tratamento do seu mdico e da secretaria de sade local. As autoridades locais de sade podem fornecer instrues para verificar seus sintomas e notificar informaes. Quando procurar atendimento mdico de Careers information officer Verifique se h sinais de Chief Operating Officer* para COVID-19. Se algum estiver apresentando qualquer um desses sinais, busque pronto-atendimento imediatamente: Dificuldade para respirar Dor ou presso persistente no peito Incio de confuso No conseguir acordar ou ficar acordado Pele, lbios ou raiz das unhas plidos, cinzas ou [place], dependendo do seu tom de pele *Essa lista no inclui todos os possveis sintomas. Ligue para seu mdico para verificar outros sintomas graves ou que o preocupam. [place] para 911 ou ligue para o pronto-socorro mais prximo antes de sair de casa: Informe o atendente que voc est buscando atendimento para algum que est ou possa estar com COVID-19. Ligue antes de ir ao consultrio do seu mdico Ligue com antecedncia. Muitas consultas mdicas para tratamento de rotina esto sendo adiadas ou feitas por telefone ou telemedicina. Se voc tiver uma consulta mdica que no pode adiar, ligue para o consultrio do seu mdico e informe-o que voc est ou pode estar com COVID-19. Isso ajudar a equipe do  consultrio a proteger eles mesmos e outros pacientes. Faa o  teste Se voc tiver sintomas de COVID-19, faa o teste. Enquanto espera o resultado do teste,  fique longe de outras pessoas, o que inclui ficar isolado das pessoas que moram na sua casa. Voc pode acessar o site [employer] de sadeestadual, tribal, local e territorial para procurar informaes locais mais recentes sobre testes. Caso esteja doente, use uma mscara que Matilde Bash e a boca Voc deve usar uma mscara que cubra o Doran Durand e a boca se precisar ficar perto de outras pessoas ou animais, incluindo animais de estimao (mesmo em casa). Voc no precisa usar a Interior and spatial designer. Caso no [place] Fish farm manager (por causa da dificuldade para respirar, por exemplo), cubra a tosse e espirros de alguma [place]. Tente ficar a pelo menos 6 ps de distncia de [employer]. Isso ajudar a proteger as Equities trader. No coloque mscaras em crianas menores de 2 anos, em pessoas com dificuldade de respirar ou em pessoas que no conseguem tirar a mscara sem ajuda. ObservaoLeda Min a pandemia da COVID-19, as mscaras faciais especiais para mdicos so reservadas para profissionais de sade e alguns socorristas. Reunion sua tosse e seus espirros Reunion a boca e o nariz com um leno de tecido ao tossir ou Haematologist. Jogue os lenos de tecido usados em uma lixeira com saco. Stacy Gardner as mos imediatamente com gua e sabo por pelo menos 20 segundos. Caso gua e sabo no estejam disponveis, limpe-as com um antissptico  base de lcool para as mos que contenha pelo menos 60% de lcool. Stacy Gardner as mos com frequncia [place] as mos frequentemente com gua e sabo por pelo menos 20 segundos. Isso  importante principalmente aps assoar o Lawyer, tossir ou espirrar, ir ao banheiro e antes de comer ou preparar alimentos. Use antissptico para as mos caso gua e sabo no estejam disponveis. Use um antissptico  base de lcool para as mos com pelo menos 60% de lcool, cobrindo todas as superfcies das mos e esfregue-as at [employer]. gua e sabo so a melhor opo, principalmente  se suas mos estiverem sujas. Evite tocar os olhos, o nariz e a boca sem antes lavar as mos. Dicas para lavar as mos Evite compartilhar itens domsticos pessoais No compartilhe pratos, copos, xcaras, talheres, toalhas ou roupa de cama com outras pessoas da [employer]. Lave totalmente esses itens com gua e sabo aps us-los ou coloque-os na lava-loua. Limpe todas as superfcies que so tocadas frequentemente no dia a dia Limpe e desinfete superfcies tocadas com frequncia dentro do quarto onde voc est permanecendo e no banheiro; use luvas descartveis. Outra pessoa pode limpar e desinfetar superfcies de reas comuns, mas voc deve limpar seu quarto e banheiro, se possvel. Se um cuidador ou outra pessoa tiver que limpar e desinfetar o quarto ou banheiro de uma pessoa infectada, eles devem faz-lo somente conforme necessrio. O cuidador/outra pessoa deve colocar uma mscara e luvas descartveis antes da limpeza. Deve esperar o mximo possvel aps a pessoa que est doente ter usado o banheiro antes de entrar para limpar e usar o banheiro. Superfcies de alto risco incluem telefones, controle remoto, bancadas, tampos de mesa, maanetas, acessrios de banheiro, vasos sanitrios, teclados, tablets e mesa de cabeceira. Limpe e desinfete reas que possam conter sangue, fezes ou lquidos corporais. Use desinfetantes e produtos de limpeza domsticos. Limpe a rea ou o item com gua e sabo ou outro detergente caso estejam sujos. Depois, use um desinfetante  domstico. Lafonda Mosses de seguir as instrues no rtulo para garantir o uso seguro e eficaz do produto. Muitos produtos recomendam manter a superfcie molhada durante vrios minutos para garantir que os germes sejam eliminados. Tambm recomendam precaues como usar [employer] e garantir que haja uma boa ventilao durante o uso do produto. Use um produto da Hoy Finlay da EPA [Environmental Protection Agency (Agncia de Proteo Ambiental dos EUA)]: Produtos  desinfetantes para coronavrus ([id number]). Orientao para desinfeco completa Quando voc pode ficar perto de outras pessoas depois de pegar COVID-19 Decidir quando voc pode ficar perto de outras pessoas  diferente para situaes distintas. Descubra quando voc pode encerrar o isolamento residencial com segurana. Em caso de outras dvidas sobre seus cuidados, entre em contato com seu mdico ou secretaria de sade estadual ou local. 09/25/2018 Milagros Reap do contedo: [employer] for [employer] and Respiratory Diseases, NCIRD ([place] Respiratrias), Diviso de [id number] informaes no se destinam a substituir as recomendaes de seu mdico. No deixe de discutir quaisquer dvidas com seu mdico. Document Revised: 05/23/2020 Document Reviewed: 05/23/2020 Elsevier Patient Education  2021 [employer].

## 2021-03-30 NOTE — Telephone Encounter (Signed)
Pt is calling to let dr Ronnald Ramp know she test positive for covid today with home test and would like antiviral medication send to [facility] [address]

## 2021-04-05 ENCOUNTER — Ambulatory Visit: Payer: Medicare Other | Admitting: Obstetrics and Gynecology

## 2021-04-19 ENCOUNTER — Inpatient Hospital Stay: Payer: Medicare Other | Attending: Obstetrics and Gynecology | Admitting: Obstetrics and Gynecology

## 2021-04-19 ENCOUNTER — Other Ambulatory Visit: Payer: Self-pay

## 2021-04-19 ENCOUNTER — Inpatient Hospital Stay: Payer: Medicare Other

## 2021-04-19 VITALS — BP 153/76 | HR 81 | Temp 97.8°F | Resp 19 | Wt 181.7 lb

## 2021-04-19 DIAGNOSIS — Z90722 Acquired absence of ovaries, bilateral: Secondary | ICD-10-CM | POA: Diagnosis not present

## 2021-04-19 DIAGNOSIS — Z9221 Personal history of antineoplastic chemotherapy: Secondary | ICD-10-CM | POA: Insufficient documentation

## 2021-04-19 DIAGNOSIS — I10 Essential (primary) hypertension: Secondary | ICD-10-CM | POA: Diagnosis not present

## 2021-04-19 DIAGNOSIS — Z90710 Acquired absence of both cervix and uterus: Secondary | ICD-10-CM | POA: Insufficient documentation

## 2021-04-19 DIAGNOSIS — C561 Malignant neoplasm of right ovary: Secondary | ICD-10-CM | POA: Diagnosis not present

## 2021-04-19 DIAGNOSIS — Z8542 Personal history of malignant neoplasm of other parts of uterus: Secondary | ICD-10-CM

## 2021-04-19 DIAGNOSIS — E669 Obesity, unspecified: Secondary | ICD-10-CM | POA: Insufficient documentation

## 2021-04-19 NOTE — Patient Instructions (Signed)
Please schedule an appointment with Dr. Georgianne Fick for January 2023. We will see you in 8 months or sooner if you have concerning symptoms. It's always great to see you and glad you're doing well!  Mykira Hofmeister And Dr. Fransisca Connors

## 2021-04-19 NOTE — Progress Notes (Signed)
Carrie Roberson  [phone] [phone]  Patient Care Team: Juline Patch, MD as PCP - General (Family Medicine) Lin Landsman, MD as Consulting Physician (Gastroenterology) Clent Jacks, RN as Oncology Nurse Navigator Mellody Drown, MD as Referring Physician (Obstetrics) Sindy Guadeloupe, MD as Consulting Physician (Oncology) [name], MD as Consulting Physician (Obstetrics and Gynecology)   Name of the patient: Carrie Roberson  [id number]  March 31, 1946   Date of visit: 04/19/2021  Gynecologic Oncology Interval Visit   Referring Provider: Dr. 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 G0P0 female seen in consultation from Dr. Georgianne Fick for ovarian cancer in right ovarian cyst   TAH-BSO 1/21 and carbo-taxol chemotherapy completed 6/21, who returns to clinic for ongoing surveillance.  No new complaints.  CA 125 has been followed though not significantly elevated at diagnosis:  06/21/20 8.8 11/17/20  8.4 At diagnosis:  10.3  Gynecologic Oncology History:  [place] concerning a midline pelvic mass.  Initial presentation was prompted by frequent urination and bladder pessure. PVR was checked and noted to be normal but bladder scan showed residual fluid collection.  TVUS was ordered and revealed a 10.2 x 9.2 x 6.3 cm midline cystic pelvic mass. Symptoms initially started in August of 2020. She had previously been followed asymptomatic 1.42 x 1.58cm simple left ovarian cyst on 11/18/2017.     Appearance on 06/26/2019 ultrasound was notable simple cyst, normal doppler flow and no free fluid. The patient endorses associated symptoms of pelvic pressure.  The patient denies associated symptoms of  early satiety, weight gain, weight loss, night sweats, vaginal bleeding and nausea.  There is not a notable family history of ovarian  cancer, uterine cancer, breast cancer, or colon cancer.  06/07/2018 Pelvic US Findings:  The uterus is anteverted and measures 6.8x3x2.7cm. Echo texture is homogenous without evidence of focal masses. The Endometrium measures 4.19 mm.   Right Ovary measures 4.7 cm3. It is normal in appearance. Left Ovary measures 5.2 cm3. It has a hypoechoic cyst = 1.4x1.17m Survey of the adnexa demonstrates no adnexal masses. There is no free fluid in the cul de sac. IMPRESSION:  1. Left Ovary measures 5.2 cm3. It has a hypoechoic cyst = 1.4x1.460m(possibility of hemorrhagic cyst vs others) 2. Cervical canal appears irregular d/t LEEP procedure  06/26/2019 Abdominal USKoreaFINDINGS: No ascites is demonstrated. There is a cystic midline pelvic mass which lies superior to the bladder, measuring 10.2 x 9.2 x 6.3 cm. IMPRESSION: 1. No ascites. 2. Large cystic mass in the pelvis, not demonstrated on previous ultrasounds. This is suspicious for ovarian cystic neoplasm. Further evaluation recommended with dedicated pelvic ultrasound and/or abdominopelvic CT.  07/01/2019 MRI Reproductive: Uterus: Measures 4.3 x 2.0 by 5.9 cm. Scattered nabothian cysts identified. No mass.  Right ovary measures the 10.5 x 6.8 by 9.0 cm (volume = 340 cm^3). There is a large cystic mass arising from the right ovary which has a maximum dimension of 10.8 cm. An eccentric, enhancing mural nodule is identified measuring 2.6 x 1.2 cm, image 50/10. No internal septation. Left ovary measures 2.9 x 2.1 by 1.8 cm. (Volume = 5.7 cm^3). This contains several small cyst which measure up to 1.4 cm.  06/29/2019 labs Cancer Antigen (CA) 125 0.0 - 38.1 U/mL 10.3   Comment: [employer] Electrochemiluminescence Immunoassay (ECLIA)  Values obtained with different assay methods  or kits cannot be  used interchangeably.  Results cannot be interpreted as absolute  evidence of the presence or absence of malignant disease.   HE4 0.0 - 96.9 pmol/L  68.8   Comment: [employer] Electrochemiluminescence Immunoassay (ECLIA)  Values obtained with different assay methods or kits cannot be  used interchangeably.  Results cannot be interpreted as absolute  evidence of the presence or absence of malignant disease.       Postmenopausal ROMA See below 1.21    ROMA calculator 12%  Of note prior LEEP 2002 (negative pathology per patient) last pap 07/26/2014 NILM.   On 08/19/19 she under TAH-BSO with with peritoneal biopsies, pelvic/aortic lymph node dissection, pelvic washings, and omentectomy with Dr. Fransisca Connors and [facility]. [facility]ianne [facility]ick at [facility].  Uneventful recovery.    Pathology:  DIAGNOSIS:  A.  FALLOPIAN TUBE AND OVARY, RIGHT; RIGHT SALPINGO-OOPHORECTOMY:  - HIGH-GRADE SEROUS CARCINOMA OF THE OVARY.  - ANGIOLYMPHATIC INVASION IS PRESENT.  - SEE CANCER SUMMARY BELOW.  - FALLOPIAN TUBE WITH BENIGN PARATUBAL CYSTS.   B.  FALLOPIAN TUBE AND OVARY, LEFT; LEFT SALPINGO-OOPHORECTOMY:  - OVARY WITH SEROUS CYSTADENOMA MEASURING 1.5 CM AND CYSTIC FOLLICLES.  - FALLOPIAN TUBE WITH BENIGN PARATUBAL CYSTS.  - NEGATIVE FOR ATYPIA AND MALIGNANCY.   C.  PERITONEAL STONE; EXCISION:  - STONE, 1.7 CM (GROSS ONLY).   D. UTERUS WITH CERVIX; TOTAL HYSTERECTOMY:  - CERVIX WITH ENDOCERVICAL POLYP MEASURING 0.5 CM, NABOTHIAN CYSTS, AND  TUNNEL CLUSTERS.  - INACTIVE ENDOMETRIUM WITH SMALL ENDOMETRIAL POLYP.  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  - NEGATIVE FOR ATYPIA AND MALIGNANCY.   E.  RIGHT GUTTER; BIOPSY:  - BENIGN MESOTHELIAL LINED FIBROADIPOSE TISSUE.  - NEGATIVE FOR MALIGNANCY.   F.  BLADDER FLAP PERITONEUM; BIOPSY:  - BENIGN FIBROADIPOSE TISSUE.  - NEGATIVE FOR MALIGNANCY.   G.  LYMPH NODE, RIGHT COMMON ILIAC; EXCISION:  - THREE LYMPH NODES NEGATIVE FOR MALIGNANCY (0/3).   H.  LYMPH NODE, RIGHT PELVIC; EXCISION:  - ONE LYMPH NODE NEGATIVE FOR MALIGNANCY (0/1).   I.  OMENTUM; OMENTECTOMY:  - BENIGN  FIBROADIPOSE TISSUE.  - NEGATIVE FOR MALIGNANCY.   J.  LYMPH NODE, RIGHT AORTIC; EXCISION:  - ONE LYMPH NODE NEGATIVE FOR MALIGNANCY (0/1).  DIAGNOSIS:  A. PELVIC WASHINGS:  - NEGATIVE FOR MALIGNANCY.  - ERYTHROCYTES AND REACTIVE MESOTHELIAL CELLS  Pathologic Staging: FIGO 1C2  Post op course has been uncomplicated. She received her second covid 19 vaccine. She received lovenox for 28 day course.    09/29/2019 CT C/A/P IMPRESSION: 1. Suspected thyroid nodule along the inferior isthmus margin of the thyroid gland along with a separate anterior mediastinal fairly high density mass with punctate calcifications. 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 Thyroid scintigraphy should be considered to further characterize these lesions; if negative on scintigraphy then further workup to exclude the possibility of lymphoma or thymic tumor would be suggested. 2. Contrast medium in the distal esophagus suggesting reflux or dysmotility. 3. Several tiny pulmonary nodules are present, 3 mm in average diameter or less. No follow-up needed if patient is low-risk (and has no known or suspected primary neoplasm). Non-contrast chest CT can be considered in 12 months if patient is high-risk.  4. Aortoiliac atherosclerotic vascular disease. 5. Multilevel lumbar degenerative disc disease. 6. Prior laparotomy with some faint stranding along the anterior omentum in the immediate vicinity of the laparotomy site, likely from benign  scarring.  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  Genetic testing for germline BRCA1/2 mutations negative.   Problem List: Patient Active Problem List   Diagnosis Date Noted   Chemotherapy-induced peripheral neuropathy  ([place]) 02/24/2020   Genetic testing 11/02/2019   Weight loss, unintentional 10/16/2019   Chemotherapy induced diarrhea 10/13/2019   Orthostasis 10/13/2019   Hypokalemia 10/13/2019   Restless leg 10/13/2019   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 10/13/2019   Iron deficiency 10/02/2019   Anxiety associated with cancer diagnosis ([place]) 10/02/2019   Chemotherapy-induced nausea 10/02/2019   Thyroid nodule 10/02/2019   Hyponatremia 10/02/2019   B12 deficiency 09/18/2019   Family history of breast cancer    Family history of leukemia    Goals of care, counseling/discussion 09/07/2019   Ovarian mass 08/19/2019   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) 08/19/2019   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HCC)    Cyst of ovary 07/15/2019   Anxiety 09/02/2017   DDD (degenerative disc disease), 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   FH: diabetes mellitus 09/02/2017   Obesity, Class I, BMI 30.0-34.9 (see actual BMI) 09/02/2017   Atypical nevus 09/02/2017   IBS (irritable bowel syndrome) 09/02/2017   Atrophic vaginitis 09/02/2017   Chronic idiopathic urticaria 09/02/2017   GERD (gastroesophageal reflux disease) 09/02/2017    Past Medical History:  Past Medical History:  Diagnosis Date   Anxiety    Arthritis    Diverticulitis    Family history of breast cancer    Family history of leukemia    GERD (gastroesophageal reflux disease)    History of ischemic colitis 09/02/2017   Hx of colonic polyp    Hypertension    H/O-PT TAKEN OFF BY PCP AS OF 2019   IBS (irritable bowel syndrome)    Ovarian cancer ([place])    Ovarian cyst    Personal history of chemotherapy 2021   6 treatments   Vaginal atrophy     Past Surgical History: Past Surgical History:  Procedure Laterality Date   ABDOMINAL HYSTERECTOMY     BREAST BIOPSY Left    needle bx/clip-neg   CERVICAL BIOPSY  W/ LOOP ELECTRODE EXCISION     CHOLECYSTECTOMY     Colonoscopoy  2007, 2008, 2011   COLONOSCOPY WITH PROPOFOL  N/A 05/05/2018   Procedure: COLONOSCOPY WITH PROPOFOL;  Surgeon: Lin Landsman, MD;  Location: [facility] ENDOSCOPY;  Service: Gastroenterology;  Laterality: N/A;   HYSTERECTOMY ABDOMINAL WITH SALPINGO-OOPHORECTOMY N/A 08/19/2019   Procedure: HYSTERECTOMY ABDOMINAL WITH SALPINGO-OOPHORECTOMY WITH PELVIC WASHINGS PERITONEAL BIOPSIES AND PARA AORTIC LYMPH NODE DISSECTION;  Surgeon: [name], MD;  Location: [facility] ORS;  Service: Gynecology;  Laterality: N/A;   LEEP     OMENTECTOMY N/A 08/19/2019   Procedure: OMENTECTOMY;  Surgeon: [name], MD;  Location: [facility] ORS;  Service: Gynecology;  Laterality: N/A;   OOPHORECTOMY     PORTA CATH INSERTION N/A 09/11/2019   Procedure: PORTA CATH INSERTION;  Surgeon: Algernon Huxley, MD;  Location: [place] [facility];  Service: Cardiovascular;  Laterality: N/A;   TUBAL LIGATION  1999    Past Gynecologic History:  Menarche: 9 Last Menstrual Period: menopausal History of Abnormal pap: No Contraception: TAH-SBO (07/2019); tubal ligation (1999)  OB History:  OB History  Gravida Para Term Preterm  AB Living  0 0 0 0 0 0  SAB IAB Ectopic Multiple Live Births  0 0 0 0 0    Family History: Family History  Problem Relation Age of Onset   Heart failure Mother    Atrial fibrillation Mother    Hypertension Mother    Diabetes Father    Heart attack Father    Breast cancer Maternal Aunt 2   Breast cancer Maternal Grandmother 74   Breast cancer Cousin        2 mat cousins   Hypertension Sister    Diabetes Sister    Blindness Sister    Hypertension Sister    Arthritis Sister    Leukemia Paternal Aunt    Leukemia Paternal Uncle    Brain cancer Paternal Aunt    Other Brother 69       Drowning accident   Liver cancer Maternal Uncle    Alcohol abuse Maternal Uncle    Prostate cancer Neg Hx    Kidney cancer Neg Hx    BRCA 1/2 Neg Hx    Social History: Retired  Science writer History   Socioeconomic History   Marital status: Single    Spouse  name: Not on file   Number of children: 0   Years of education: Not on file   Highest education level: Associate degree: academic program  Occupational History   Occupation: Retired  Tobacco Use   Smoking status: Never   Smokeless tobacco: Never   Tobacco comments:    2bd hand smoke from father  Vaping Use   Vaping Use: Never used  Substance and Sexual Activity   Alcohol use: Yes    Comment: rarely   Drug use: No   Sexual activity: Never    Birth control/protection: Post-menopausal  Other Topics Concern   Not on file  Social History Narrative   Not on file   Social Determinants of Health   Financial Resource Strain: Not on file  Food Insecurity: Not on file  Transportation Needs: Not on file  Physical Activity: Not on file  Stress: Not on file  Social Connections: Not on file  Intimate Partner Violence: Not on file   Allergies: Allergies  Allergen Reactions   Citalopram Nausea And Vomiting   Ivp Dye [Iodinated Diagnostic Agents] Hives and Swelling    hives   Meloxicam     unknown   Adhesive [Tape] Rash   Amlodipine Rash   Ampicillin Rash    Did it involve swelling of the face/tongue/throat, SOB, or low BP? No Did it involve sudden or severe rash/hives, skin peeling, or any reaction on the inside of your mouth or nose? Yes Did you need to seek medical attention at a hospital or doctor's office? Yes When did it last happen?       10 + years If all above answers are "NO", may proceed with cephalosporin use.   Cefoxitin Rash   Estradiol Rash   Estropipate Rash   Olmesartan Medoxomil-Hctz Rash   Sertraline Rash        Sulfa Antibiotics Rash   Current Medications: Current Outpatient Medications  Medication Sig Dispense Refill   acetaminophen (TYLENOL) 500 MG tablet Take 1,000 mg by mouth every 6 (six) hours as needed (for pain.).     fluticasone (FLONASE) 50 MCG/ACT nasal spray Place 1 spray into both nostrils daily. otc     lisinopril (ZESTRIL) 5 MG tablet  TAKE 1 TABLET BY MOUTH EVERY DAY 90 tablet 1   loratadine (  CLARITIN) 10 MG tablet Take 10 mg by mouth daily. otc     LORazepam (ATIVAN) 0.5 MG tablet TAKE 1 TABLET (0.5 MG TOTAL) BY MOUTH EVERY 6 (SIX) HOURS AS NEEDED (NAUSEA/VOMITING OR ANXIETY). 90 tablet 0   mirabegron ER (MYRBETRIQ) 50 MG TB24 tablet Take 1 tablet (50 mg total) by mouth daily. 30 tablet 11   pregabalin (LYRICA) 75 MG capsule Take 1 capsule (75 mg total) by mouth 2 (two) times daily. 60 capsule 3   No current facility-administered medications for this visit.   Facility-Administered Medications Ordered in Other Visits  Medication Dose Route Frequency Provider Last Rate Last Admin   sodium chloride flush (NS) 0.9 % injection 10 mL  10 mL Intravenous PRN Sindy Guadeloupe, MD   10 mL at 01/05/20 0750     Review of Systems General:  no complaints Skin: no complaints Eyes: no complaints HEENT: no complaints Breasts: no complaints Pulmonary: no complaints Cardiac: no complaints Gastrointestinal: no complaints Genitourinary/Sexual: no complaints Ob/Gyn: no complaints Musculoskeletal: no complaints Hematology: no complaints Neurologic/Psych: no complaints   Objective:  Physical Examination:  Vitals:   04/19/21 1033  BP: (!) 153/76  Pulse: 81  Resp: 19  Temp: 97.8 F (36.6 C)  SpO2: 99%  Weight: 181 lb 11.2 oz (82.4 kg)    ECOG Performance Status: 1 - Symptomatic but completely ambulatory  EXAM  GENERAL: Patient is a well appearing female in no acute distress HEENT:  Sclera clear. Anicteric NODES:  Negative axillary, supraclavicular, inguinal lymph node survery LUNGS:  Clear to auscultation bilaterally.   HEART:  Regular rate and rhythm.  ABDOMEN:  Soft, nontender.  No hernias, incisions well healed. No masses or ascites EXTREMITIES:  No peripheral edema. Atraumatic. No cyanosis SKIN:  Clear with no obvious rashes or skin changes.  NEURO:  Nonfocal. Well oriented.  Appropriate affect.  PELVIC: exam  chaperoned by nurse;   Vulva: normal appearing vulva with no masses, tenderness or lesions; Vagina: normal vagina healing well. Bimanual/RV: normal  Radiologic Imaging: No imaging on site today.     Assessment:  Carrie Roberson is a 75 y.o. 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  Finished 6 cycles of carbo/taxol chemotherapy with Neulasta 6/21. NED  CA125/HE4 not elevated at diagnosis.  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   Germline genetic testing negative for BRCA mutations.   Medical co-morbidities complicating care: HTN, obesity and prior abdominal surgery (BTL and cholecystectomy). There is no height or weight on file to calculate BMI.   Plan:   Problem List Items Addressed This Visit       Endocrine   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[place]) - Primary   PARP inhibitor maintenance therapy not indicated for stage I disease (even if she had a BRCA mutation).  Does not need somatic tumor testing, as PARP inhibitor maintenance not approved for stage I disease.   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   Plan for q 3-4 month surveillance alternating between Gyn Onc and Med Onc with exams and CA125 following completion of 6 cycles of carboplatin/taxol chemotherapy.  Estimated risk of recurrence 10-20%.    CA125 today.  Carrie Rutter, DNP, AGNP-C [place] at [facility] [phone] (clinic)  I personally interviewed and examined the patient. Agreed with the above/below plan of care. I have directly contributed to  assessment and plan of care of this patient and educated and discussed with patient and family.  Mellody Drown, MD

## 2021-04-20 ENCOUNTER — Ambulatory Visit
Admission: RE | Admit: 2021-04-20 | Discharge: 2021-04-20 | Disposition: A | Discharge status: Home / Self Care | Payer: Medicare Other | Source: Ambulatory Visit | Attending: Obstetrics and Gynecology | Admitting: Obstetrics and Gynecology

## 2021-04-20 DIAGNOSIS — Z1231 Encounter for screening mammogram for malignant neoplasm of breast: Secondary | ICD-10-CM | POA: Diagnosis not present

## 2021-04-20 LAB — CA 125: Cancer Antigen (CA) 125: 8.2 U/mL (ref 0.0–38.1)

## 2021-04-28 ENCOUNTER — Other Ambulatory Visit: Payer: Self-pay | Admitting: Family Medicine

## 2021-04-28 DIAGNOSIS — I10 Essential (primary) hypertension: Secondary | ICD-10-CM

## 2021-04-28 NOTE — Telephone Encounter (Signed)
Courtesy refill.  Requested Prescriptions  Pending Prescriptions Disp Refills  . lisinopril (ZESTRIL) 5 MG tablet [Pharmacy Med Name: LISINOPRIL 5 MG TABLET] 90 tablet 0    Sig: TAKE 1 TABLET BY MOUTH EVERY DAY     Cardiovascular:  ACE Inhibitors Failed - 04/28/2021  2:07 AM      Failed - Last BP in normal range    BP Readings from Last 1 Encounters:  04/19/21 (!) 153/76         Failed - Valid encounter within last 6 months    Recent Outpatient Visits          4 weeks ago [facility] Juline Patch, MD   8 months ago Pigmented skin lesion   [facility] Juline Patch, MD   1 year ago Acute maxillary sinusitis, recurrence not specified   [facility] Juline Patch, MD   1 year ago Right foot pain   [facility] Juline Patch, MD   1 year ago Diverticulitis   [facility] Juline Patch, MD      Future Appointments            In 4 days Juline Patch, MD [facility] [place]   In 1 week [address], [facility]           Passed - Cr in normal range and within 180 days    Creatinine, Ser  Date Value Ref Range Status  11/17/2020 0.75 0.44 - 1.00 mg/dL Final         Passed - K in normal range and within 180 days    Potassium  Date Value Ref Range Status  11/17/2020 4.2 3.5 - 5.1 mmol/L Final         Passed - Patient is not pregnant

## 2021-05-02 ENCOUNTER — Other Ambulatory Visit: Payer: Self-pay

## 2021-05-02 ENCOUNTER — Encounter: Payer: Self-pay | Admitting: Family Medicine

## 2021-05-02 ENCOUNTER — Ambulatory Visit (INDEPENDENT_AMBULATORY_CARE_PROVIDER_SITE_OTHER): Payer: Medicare Other | Admitting: Family Medicine

## 2021-05-02 VITALS — BP 130/76 | HR 72 | Ht 63.0 in | Wt 181.0 lb

## 2021-05-02 DIAGNOSIS — I10 Essential (primary) hypertension: Secondary | ICD-10-CM | POA: Diagnosis not present

## 2021-05-02 DIAGNOSIS — T451X5A Adverse effect of antineoplastic and immunosuppressive drugs, initial encounter: Secondary | ICD-10-CM | POA: Insufficient documentation

## 2021-05-02 DIAGNOSIS — Z23 Encounter for immunization: Secondary | ICD-10-CM | POA: Diagnosis not present

## 2021-05-02 DIAGNOSIS — D701 Agranulocytosis secondary to cancer chemotherapy: Secondary | ICD-10-CM | POA: Insufficient documentation

## 2021-05-02 MED ORDER — LISINOPRIL 5 MG PO TABS: 5.0000 mg | ORAL_TABLET | Freq: Every day | ORAL | 1 refills | Status: DC

## 2021-05-02 NOTE — Progress Notes (Signed)
Date:  05/02/2021   Name:  Carrie Roberson   DOB:  15-Jul-1946   MRN:  [id number]   Chief Complaint: Hypertension and Flu Vaccine  Hypertension The problem has been gradually improving since onset. The problem is controlled. Pertinent negatives include no anxiety, blurred vision, chest pain, headaches, malaise/fatigue, neck pain, orthopnea, palpitations, peripheral edema, PND, shortness of breath or sweats. Past treatments include ACE inhibitors. The current treatment provides moderate improvement. There are no compliance problems.  There is no history of angina, kidney disease, CAD/MI, CVA, heart failure, left ventricular hypertrophy, PVD or retinopathy. There is no history of chronic renal disease, a hypertension causing med or renovascular disease.   Lab Results  Component Value Date   CREATININE 0.75 11/17/2020   BUN 14 11/17/2020   NA 136 11/17/2020   K 4.2 11/17/2020   CL 104 11/17/2020   CO2 24 11/17/2020   Lab Results  Component Value Date   CHOL 188 09/02/2017   HDL 53 09/02/2017   LDLCALC 109 (H) 09/02/2017   TRIG 131 09/02/2017   CHOLHDL 3.5 09/02/2017   Lab Results  Component Value Date   TSH 0.856 09/02/2017   No results found for: HGBA1C Lab Results  Component Value Date   WBC 4.1 11/17/2020   HGB 14.6 11/17/2020   HCT 43.6 11/17/2020   MCV 92.0 11/17/2020   PLT 226 11/17/2020   Lab Results  Component Value Date   ALT 21 11/17/2020   AST 18 11/17/2020   ALKPHOS 86 11/17/2020   BILITOT 0.5 11/17/2020     Review of Systems  Constitutional:  Negative for chills, fever and malaise/fatigue.  HENT:  Negative for drooling, ear discharge, ear pain and sore throat.   Eyes:  Negative for blurred vision.  Respiratory:  Negative for cough, shortness of breath and wheezing.   Cardiovascular:  Negative for chest pain, palpitations, orthopnea, leg swelling and PND.  Gastrointestinal:  Negative for abdominal pain, blood in stool, constipation, diarrhea and nausea.   Endocrine: Negative for polydipsia.  Genitourinary:  Negative for dysuria, frequency, hematuria and urgency.  Musculoskeletal:  Negative for back pain, myalgias and neck pain.  Skin:  Negative for rash.  Allergic/Immunologic: Negative for environmental allergies.  Neurological:  Negative for dizziness and headaches.  Hematological:  Does not bruise/bleed easily.  Psychiatric/Behavioral:  Negative for suicidal ideas. The patient is not nervous/anxious.    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 10/13/2019   Iron deficiency 10/02/2019   Anxiety associated with cancer diagnosis ([place]) 10/02/2019   Chemotherapy-induced nausea 10/02/2019   Thyroid nodule 10/02/2019   Hyponatremia 10/02/2019   B12 deficiency 09/18/2019   Family history of breast cancer    Family history of leukemia    Goals of care, counseling/discussion 09/07/2019   Ovarian mass 08/19/2019   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) 08/19/2019   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HCC)    Cyst of ovary 07/15/2019   Anxiety 09/02/2017   DDD (degenerative disc disease), 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 No Did it involve sudden or severe rash/hives, skin peeling, or any reaction on the inside of your mouth or nose? Yes Did you need to seek medical attention at a hospital or doctor's office? Yes When did it last happen?       10 + years If all above answers are "NO", may proceed with cephalosporin use.   Cefoxitin Rash   Estradiol Rash   Estropipate Rash   Olmesartan Medoxomil-Hctz Rash   Sertraline Rash        Sulfa Antibiotics Rash    Past Surgical History:  Procedure Laterality Date   ABDOMINAL HYSTERECTOMY     BREAST BIOPSY Left    needle bx/clip-neg   CERVICAL BIOPSY  W/ LOOP ELECTRODE EXCISION     CHOLECYSTECTOMY     Colonoscopoy  2007, 2008, 2011   COLONOSCOPY WITH PROPOFOL N/A 05/05/2018   Procedure: COLONOSCOPY WITH PROPOFOL;  Surgeon: Lin Landsman, MD;  Location: [facility] ENDOSCOPY;  Service: Gastroenterology;  Laterality: N/A;   HYSTERECTOMY ABDOMINAL WITH SALPINGO-OOPHORECTOMY N/A 08/19/2019   Procedure: HYSTERECTOMY ABDOMINAL WITH SALPINGO-OOPHORECTOMY WITH PELVIC WASHINGS PERITONEAL BIOPSIES AND PARA AORTIC LYMPH NODE DISSECTION;  Surgeon: [name], MD;  Location: [facility] ORS;  Service: Gynecology;  Laterality: N/A;   LEEP     OMENTECTOMY N/A 08/19/2019   Procedure: OMENTECTOMY;  Surgeon: [name], MD;  Location: [facility] ORS;  Service: Gynecology;  Laterality: N/A;   OOPHORECTOMY     PORTA CATH INSERTION N/A 09/11/2019   Procedure: PORTA CATH INSERTION;  Surgeon: Algernon Huxley, MD;  Location: [place] [facility];  Service: Cardiovascular;  Laterality: N/A;   TUBAL LIGATION  1999    Social History   Tobacco Use   Smoking status: Never   Smokeless tobacco: Never   Tobacco comments:    2bd hand smoke from father  Vaping Use   Vaping Use: Never used  Substance Use Topics   Alcohol use: Yes    Comment: rarely   Drug use: No     Medication list has been  reviewed and updated.  Current Meds  Medication Sig   acetaminophen (TYLENOL) 500 MG tablet Take 1,000 mg by mouth every 6 (six) hours as needed (for pain.).   Cranberry 300 MG tablet Take 300 mg by mouth 2 (two) times daily.   fluticasone (FLONASE) 50 MCG/ACT nasal spray Place 1 spray into both nostrils daily. otc   lisinopril (ZESTRIL) 5 MG tablet TAKE 1 TABLET BY MOUTH EVERY DAY   loratadine (CLARITIN) 10 MG tablet Take 10 mg by mouth daily. otc   LORazepam (ATIVAN) 0.5 MG tablet TAKE 1 TABLET (0.5 MG TOTAL) BY MOUTH EVERY 6 (SIX) HOURS AS NEEDED (NAUSEA/VOMITING OR ANXIETY).   mirabegron ER (MYRBETRIQ) 50 MG TB24 tablet Take 1 tablet (50 mg total) by mouth daily.   pregabalin (LYRICA) 75 MG capsule Take 1 capsule (75 mg total) by mouth 2 (two) times daily.    PHQ 2/9 Scores 03/30/2021 08/11/2020 03/29/2020 02/23/2020  PHQ - 2 Score 0 0 2 2  PHQ- 9 Score 3 0 3 8  GAD 7 : Generalized Anxiety Score 03/30/2021 03/29/2020 02/23/2020 03/23/2019  Nervous, Anxious, on Edge 0 1 1 1   Control/stop worrying 1 0 0 0  Worry too much - different things 0 0 0 2  Trouble relaxing 1 0 0 0  Restless 0 0 0 0  Easily annoyed or irritable 0 1 0 0  Afraid - awful might happen 0 0 0 1  Total GAD 7 Score 2 2 1 4   Anxiety Difficulty Not difficult at all Not difficult at all Not difficult at all Somewhat difficult    BP Readings from Last 3 Encounters:  04/19/21 (!) 153/76  01/11/21 (!) 164/89  12/26/20 131/72    Physical Exam Vitals and nursing note reviewed.  Constitutional:      General: She is not in acute distress.    Appearance: She is not diaphoretic.  HENT:     Head: Normocephalic and atraumatic.     Right Ear: Tympanic membrane, ear canal and external ear normal.     Left Ear: Tympanic membrane, ear canal and external ear normal.     Nose: Nose normal. No congestion or rhinorrhea.  Eyes:     General:        Right eye: No discharge.        Left eye: No discharge.     Conjunctiva/sclera:  Conjunctivae normal.     Pupils: Pupils are equal, round, and reactive to light.  Neck:     Thyroid: No thyromegaly.     Vascular: No JVD.  Cardiovascular:     Rate and Rhythm: Normal rate and regular rhythm.     Heart sounds: Normal heart sounds. No murmur heard.   No friction rub. No gallop.  Pulmonary:     Effort: Pulmonary effort is normal.     Breath sounds: Normal breath sounds. No wheezing or rhonchi.  Abdominal:     General: Bowel sounds are normal.     Palpations: Abdomen is soft. There is no mass.     Tenderness: There is no abdominal tenderness. There is no guarding.  Musculoskeletal:        General: Normal range of motion.     Cervical back: Normal range of motion and neck supple.  Lymphadenopathy:     Cervical: No cervical adenopathy.  Skin:    General: Skin is warm and dry.     Capillary Refill: Capillary refill takes less than 2 seconds.  Neurological:     Mental Status: She is alert.     Deep Tendon Reflexes: Reflexes are normal and symmetric.    Wt Readings from Last 3 Encounters:  05/02/21 181 lb (82.1 kg)  04/19/21 181 lb 11.2 oz (82.4 kg)  01/11/21 178 lb (80.7 kg)    Ht 5\' 3"  (1.6 m)   Wt 181 lb (82.1 kg)   BMI 32.06 kg/m   Assessment and Plan:  1. Essential hypertension Chronic.  Controlled.  Stable.  Today's blood pressure is 130/76.  Continue lisinopril 5 mg once a day.  We will recheck in 6 months. - lisinopril (ZESTRIL) 5 MG tablet; Take 1 tablet (5 mg total) by mouth daily.  Dispense: 90 tablet; Refill: 1  2. Need for immunization against influenza Discussed and administered. - Flu Vaccine QUAD High Dose(Fluad)

## 2021-05-09 ENCOUNTER — Ambulatory Visit: Payer: Self-pay | Admitting: Physician Assistant

## 2021-05-16 ENCOUNTER — Other Ambulatory Visit: Payer: Self-pay | Admitting: Physician Assistant

## 2021-05-16 ENCOUNTER — Encounter: Payer: Self-pay | Admitting: Gastroenterology

## 2021-05-16 ENCOUNTER — Telehealth: Payer: Self-pay

## 2021-05-16 DIAGNOSIS — R350 Frequency of micturition: Secondary | ICD-10-CM

## 2021-05-16 MED ORDER — PEG 3350-KCL-NA BICARB-NACL 420 G PO SOLR: 4000.0000 mL | Freq: Once | ORAL | 0 refills | Status: AC

## 2021-05-16 NOTE — Telephone Encounter (Signed)
Patient called about no prep so I sent in prep for procedure called patient patient is aware

## 2021-05-17 ENCOUNTER — Encounter: Payer: Self-pay | Admitting: Anesthesiology

## 2021-05-17 ENCOUNTER — Telehealth: Payer: Self-pay

## 2021-05-17 ENCOUNTER — Encounter: Admission: RE | Payer: Self-pay | Source: Home / Self Care

## 2021-05-17 ENCOUNTER — Ambulatory Visit: Admission: RE | Admit: 2021-05-17 | Payer: Medicare Other | Source: Home / Self Care | Admitting: Gastroenterology

## 2021-05-17 SURGERY — COLONOSCOPY WITH PROPOFOL
Anesthesia: General

## 2021-05-17 NOTE — Anesthesia Preprocedure Evaluation (Deleted)
Anesthesia Evaluation  Patient identified by MRN, date of birth, ID band Patient awake    Reviewed: Allergy & Precautions, NPO status , Patient's Chart, lab work & pertinent test results  History of Anesthesia Complications Negative for: history of anesthetic complications  Airway Mallampati: III       Dental  (+) Chipped, Dental Advisory Given,  Missing crown couple days ago:   Pulmonary neg pulmonary ROS, neg sleep apnea, neg COPD, Not current smoker,    Pulmonary exam normal        Cardiovascular Exercise Tolerance: Good hypertension, Pt. on medications (-) Past MI and (-) CHF Normal cardiovascular exam(-) dysrhythmias (-) Valvular Problems/Murmurs     Neuro/Psych neg Seizures PSYCHIATRIC DISORDERS Anxiety  Neuromuscular disease (chemo-induced peripheral neuropathy)    GI/Hepatic Neg liver ROS, GERD  Medicated and Controlled,  Endo/Other  negative endocrine ROSneg diabetes  Renal/GU negative Renal ROS  Female GU complaint     Musculoskeletal  (+) Arthritis ,   Abdominal Normal abdominal exam  (+)   Peds negative pediatric ROS (+)  Hematology   Anesthesia Other Findings Past Medical History: No date: Anxiety No date: Arthritis No date: GERD (gastroesophageal reflux disease) 09/02/2017: History of ischemic colitis No date: Hx of colonic polyp No date: Hypertension     Comment:  H/O-PT TAKEN OFF BY PCP AS OF 2019 No date: IBS (irritable bowel syndrome) No date: Ovarian cyst No date: Vaginal atrophy  Reproductive/Obstetrics                             Anesthesia Physical  Anesthesia Plan  ASA: II  Anesthesia Plan: General   Post-op Pain Management:    Induction: Intravenous  PONV Risk Score and Plan: 3 and Treatment may vary due to age or medical condition and TIVA  Airway Management Planned: Nasal Cannula and Natural Airway  Additional Equipment:   Intra-op Plan:    Post-operative Plan:   Informed Consent:   Plan Discussed with: CRNA and Surgeon  Anesthesia Plan Comments:         Anesthesia Quick Evaluation

## 2021-05-17 NOTE — Telephone Encounter (Signed)
Pt. Calling to reschedule colonoscopy she could not finish her prep and had vomiting

## 2021-05-17 NOTE — Telephone Encounter (Signed)
  Patient called to cancel her procedure for today and she could not finish the prep wants to call us back when she is ready to reschedule

## 2021-05-19 ENCOUNTER — Other Ambulatory Visit: Payer: Self-pay

## 2021-05-19 ENCOUNTER — Telehealth: Payer: Self-pay | Admitting: *Deleted

## 2021-05-19 ENCOUNTER — Inpatient Hospital Stay: Payer: Medicare Other | Attending: Oncology

## 2021-05-19 ENCOUNTER — Encounter: Payer: Self-pay | Admitting: Oncology

## 2021-05-19 ENCOUNTER — Other Ambulatory Visit: Payer: Self-pay | Admitting: *Deleted

## 2021-05-19 ENCOUNTER — Ambulatory Visit: Payer: Self-pay

## 2021-05-19 ENCOUNTER — Inpatient Hospital Stay (HOSPITAL_BASED_OUTPATIENT_CLINIC_OR_DEPARTMENT_OTHER): Payer: Medicare Other | Admitting: Oncology

## 2021-05-19 VITALS — BP 141/75 | HR 72 | Temp 99.1°F | Resp 18 | Wt 182.0 lb

## 2021-05-19 DIAGNOSIS — Z90710 Acquired absence of both cervix and uterus: Secondary | ICD-10-CM | POA: Insufficient documentation

## 2021-05-19 DIAGNOSIS — R300 Dysuria: Secondary | ICD-10-CM

## 2021-05-19 DIAGNOSIS — Z803 Family history of malignant neoplasm of breast: Secondary | ICD-10-CM | POA: Diagnosis not present

## 2021-05-19 DIAGNOSIS — Z8543 Personal history of malignant neoplasm of ovary: Secondary | ICD-10-CM

## 2021-05-19 DIAGNOSIS — N3001 Acute cystitis with hematuria: Secondary | ICD-10-CM

## 2021-05-19 DIAGNOSIS — Z808 Family history of malignant neoplasm of other organs or systems: Secondary | ICD-10-CM | POA: Diagnosis not present

## 2021-05-19 DIAGNOSIS — N390 Urinary tract infection, site not specified: Secondary | ICD-10-CM

## 2021-05-19 DIAGNOSIS — Z806 Family history of leukemia: Secondary | ICD-10-CM | POA: Diagnosis not present

## 2021-05-19 DIAGNOSIS — R350 Frequency of micturition: Secondary | ICD-10-CM

## 2021-05-19 DIAGNOSIS — C561 Malignant neoplasm of right ovary: Secondary | ICD-10-CM | POA: Insufficient documentation

## 2021-05-19 DIAGNOSIS — I10 Essential (primary) hypertension: Secondary | ICD-10-CM | POA: Insufficient documentation

## 2021-05-19 DIAGNOSIS — Z08 Encounter for follow-up examination after completed treatment for malignant neoplasm: Secondary | ICD-10-CM

## 2021-05-19 LAB — COMPREHENSIVE METABOLIC PANEL
ALT: 16 U/L (ref 0–44)
AST: 16 U/L (ref 15–41)
Albumin: 4 g/dL (ref 3.5–5.0)
Alkaline Phosphatase: 82 U/L (ref 38–126)
Anion gap: 5 (ref 5–15)
BUN: 10 mg/dL (ref 8–23)
CO2: 26 mmol/L (ref 22–32)
Calcium: 9.8 mg/dL (ref 8.9–10.3)
Chloride: 105 mmol/L (ref 98–111)
Creatinine, Ser: 0.67 mg/dL (ref 0.44–1.00)
GFR, Estimated: >60 mL/min (ref >60)
Glucose, Bld: 100 mg/dL — ABNORMAL HIGH (ref 70–99)
Potassium: 3.7 mmol/L (ref 3.5–5.1)
Sodium: 136 mmol/L (ref 135–145)
Total Bilirubin: 0.4 mg/dL (ref 0.3–1.2)
Total Protein: 6.8 g/dL (ref 6.5–8.1)

## 2021-05-19 LAB — CBC WITH DIFFERENTIAL/PLATELET
Abs Immature Granulocytes: 0.01 10*3/uL (ref 0.00–0.07)
Basophils Absolute: 0 10*3/uL (ref 0.0–0.1)
Basophils Relative: 0 %
Eosinophils Absolute: 0.1 10*3/uL (ref 0.0–0.5)
Eosinophils Relative: 1 %
HCT: 42.2 % (ref 36.0–46.0)
Hemoglobin: 14.1 g/dL (ref 12.0–15.0)
Immature Granulocytes: 0 %
Lymphocytes Relative: 36 %
Lymphs Abs: 2.1 10*3/uL (ref 0.7–4.0)
MCH: 30.7 pg (ref 26.0–34.0)
MCHC: 33.4 g/dL (ref 30.0–36.0)
MCV: 91.7 fL (ref 80.0–100.0)
Monocytes Absolute: 0.7 10*3/uL (ref 0.1–1.0)
Monocytes Relative: 12 %
Neutro Abs: 3 10*3/uL (ref 1.7–7.7)
Neutrophils Relative %: 51 %
Platelets: 233 10*3/uL (ref 150–400)
RBC: 4.6 MIL/uL (ref 3.87–5.11)
RDW: 12.8 % (ref 11.5–15.5)
WBC: 5.9 10*3/uL (ref 4.0–10.5)
nRBC: 0 % (ref 0.0–0.2)

## 2021-05-19 LAB — URINALYSIS, COMPLETE (UACMP) WITH MICROSCOPIC
Bilirubin Urine: NEGATIVE
Glucose, UA: NEGATIVE mg/dL
Ketones, ur: NEGATIVE mg/dL
Nitrite: POSITIVE — AB
Protein, ur: 30 mg/dL — AB
Specific Gravity, Urine: 1.012 (ref 1.005–1.030)
WBC, UA: >50 WBC/hpf — ABNORMAL HIGH (ref 0–5)
pH: 5 (ref 5.0–8.0)

## 2021-05-19 MED ORDER — SODIUM CHLORIDE 0.9% FLUSH
10.0000 mL | Freq: Once | INTRAVENOUS | Status: AC
  Administered 2021-05-19: 10 mL via INTRAVENOUS
  Filled 2021-05-19: qty 10

## 2021-05-19 MED ORDER — HEPARIN SOD (PORK) LOCK FLUSH 100 UNIT/ML IV SOLN
INTRAVENOUS | Status: AC
  Administered 2021-05-19: 500 [IU] via INTRAVENOUS
  Filled 2021-05-19: qty 5

## 2021-05-19 MED ORDER — HEPARIN SOD (PORK) LOCK FLUSH 100 UNIT/ML IV SOLN
500.0000 [IU] | Freq: Once | INTRAVENOUS | Status: AC
  Filled 2021-05-19: qty 5

## 2021-05-19 MED ORDER — NITROFURANTOIN MONOHYD MACRO 100 MG PO CAPS: 100.0000 mg | ORAL_CAPSULE | Freq: Two times a day (BID) | ORAL | 0 refills | Status: DC

## 2021-05-19 NOTE — Telephone Encounter (Signed)
Nitrofurantoin 100 mg BID x 5 days sent to [facility] in [place], per MD request for positive ua.  Patient notified and verbalized understanding.

## 2021-05-19 NOTE — Progress Notes (Signed)
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  This was followed by an MR pelvis with and without contrast in December 2020 which showed a large cystic mass arising from the right ovary measuring 1.8 cm.  This was followed by TAH/BSO with peritoneal biopsies with pelvic/aortic lymph node dissection pelvic washings and omentectomy with Dr. Fransisca Connors and Dr. Georgianne Fick on 08/19/2019.  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  Angiolymphatic invasion is present.  Bilateral fallopian tubes were negative for atypia and malignancy.  Peritoneal stone excision negative for malignancy.  Uterus with cervix negative for atypia and malignancy.  Bladder, right gutter negative for malignancy.  5 lymph nodes were sampled and were negative for malignancy.  Pelvic washings were also negative for malignancy.  Pathologic staging FIGO 1 C2   Patient was seen by Dr. Fransisca Connors postoperatively and recommendation was to proceed with adjuvant chemotherapy carbotaxol for 6 cycles.   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  The CancerNext gene panel offered by  [employer] includes sequencing and rearrangement analysis for the following 34 genes:   APC, ATM, BARD1, BMPR1A, BRCA1, BRCA2, BRIP1, CDH1, CDK4, CDKN2A, CHEK2, DICER1, HOXB13, EPCAM, GREM1, MLH1, MRE11A, MSH2, MSH6, MUTYH, NBN, NF1, PALB2, PMS2, POLD1, POLE, PTEN, RAD50, RAD51C, RAD51D, SMAD4, SMARCA4, STK11, and TP53.  The report date is 10/29/2019.   TumorHRD testing was negative.      Interval history-patient is doing well overall.  She does report some burning urination since yesterday night.  Denies any changes in her bowel habits.  Neuropathy has been stable.  ECOG PS- 1 Pain scale- 0  Review of systems- Review of Systems  Constitutional:  Negative for chills, fever, malaise/fatigue and weight loss.  HENT:  Negative for congestion, ear discharge and nosebleeds.   Eyes:  Negative for blurred vision.  Respiratory:  Negative for cough, hemoptysis, sputum production, shortness of breath and wheezing.   Cardiovascular:  Negative for chest pain, palpitations, orthopnea and claudication.  Gastrointestinal:  Negative for abdominal pain, blood in stool, constipation, diarrhea, heartburn, melena, nausea and vomiting.  Genitourinary:  Positive for dysuria and urgency. Negative for flank pain, frequency and hematuria.  Musculoskeletal:  Negative for back pain, joint pain and myalgias.  Skin:  Negative for rash.  Neurological:  Negative for dizziness, tingling, focal weakness, seizures, weakness and headaches.  Endo/Heme/Allergies:  Does not bruise/bleed easily.  Psychiatric/Behavioral:  Negative for depression and suicidal ideas. The patient does not have insomnia.      Allergies  Allergen Reactions   Citalopram Nausea And Vomiting   Ivp Dye [Iodinated Diagnostic Agents] Hives and Swelling    hives   Meloxicam     unknown   Adhesive [Tape] Rash   Amlodipine Rash   Ampicillin  Rash    Did it involve swelling of the face/tongue/throat, SOB, or low BP? No Did it involve sudden or severe  rash/hives, skin peeling, or any reaction on the inside of your mouth or nose? Yes Did you need to seek medical attention at a hospital or doctor's office? Yes When did it last happen?       10 + years If all above answers are "NO", may proceed with cephalosporin use.   Cefoxitin Rash   Estradiol Rash   Estropipate Rash   Olmesartan Medoxomil-Hctz Rash   Sertraline Rash        Sulfa Antibiotics Rash     Past Medical History:  Diagnosis Date   Anxiety    Arthritis    Diverticulitis    Family history of breast cancer    Family history of leukemia    GERD (gastroesophageal reflux disease)    History of ischemic colitis 09/02/2017   Hx of colonic polyp    Hypertension    H/O-PT TAKEN OFF BY PCP AS OF 2019   IBS (irritable bowel syndrome)    Ovarian cancer ([place])    Ovarian cyst    Personal history of chemotherapy 2021   6 treatments   Vaginal atrophy      Past Surgical History:  Procedure Laterality Date   ABDOMINAL HYSTERECTOMY     BREAST BIOPSY Left    needle bx/clip-neg   CERVICAL BIOPSY  W/ LOOP ELECTRODE EXCISION     CHOLECYSTECTOMY     Colonoscopoy  2007, 2008, 2011   COLONOSCOPY WITH PROPOFOL N/A 05/05/2018   Procedure: COLONOSCOPY WITH PROPOFOL;  Surgeon: Lin Landsman, MD;  Location: [facility] ENDOSCOPY;  Service: Gastroenterology;  Laterality: N/A;   HYSTERECTOMY ABDOMINAL WITH SALPINGO-OOPHORECTOMY N/A 08/19/2019   Procedure: HYSTERECTOMY ABDOMINAL WITH SALPINGO-OOPHORECTOMY WITH PELVIC WASHINGS PERITONEAL BIOPSIES AND PARA AORTIC LYMPH NODE DISSECTION;  Surgeon: [name], MD;  Location: [facility] ORS;  Service: Gynecology;  Laterality: N/A;   LEEP     OMENTECTOMY N/A 08/19/2019   Procedure: OMENTECTOMY;  Surgeon: [name], MD;  Location: [facility] ORS;  Service: Gynecology;  Laterality: N/A;   OOPHORECTOMY     PORTA CATH INSERTION N/A 09/11/2019   Procedure: PORTA CATH INSERTION;  Surgeon: Algernon Huxley, MD;  Location: [place] [facility];  Service:  Cardiovascular;  Laterality: N/A;   TUBAL LIGATION  1999    Social History   Socioeconomic History   Marital status: Single    Spouse name: Not on file   Number of children: 0   Years of education: Not on file   Highest education level: Associate degree: academic program  Occupational History   Occupation: Retired  Tobacco Use   Smoking status: Never   Smokeless tobacco: Never   Tobacco comments:    2bd hand smoke from father  Vaping Use   Vaping Use: Never used  Substance and Sexual Activity   Alcohol use: Yes    Comment: rarely   Drug use: No   Sexual activity: Never    Birth control/protection: Post-menopausal  Other Topics Concern   Not on file  Social History Narrative   Not on file   Social Determinants of Health   Financial Resource Strain: Not on file  Food Insecurity: Not on file  Transportation Needs: Not on file  Physical Activity: Not on file  Stress: Not on file  Social Connections: Not on file  Intimate Partner Violence: Not on file    Family History  Problem Relation Age  of Onset   Heart failure Mother    Atrial fibrillation Mother    Hypertension Mother    Diabetes Father    Heart attack Father    Breast cancer Maternal Aunt 1   Breast cancer Maternal Grandmother 27   Breast cancer Cousin        2 mat cousins   Hypertension Sister    Diabetes Sister    Blindness Sister    Hypertension Sister    Arthritis Sister    Leukemia Paternal Aunt    Leukemia Paternal Uncle    Brain cancer Paternal Aunt    Other Brother 38       Drowning accident   Liver cancer Maternal Uncle    Alcohol abuse Maternal Uncle    Prostate cancer Neg Hx    Kidney cancer Neg Hx    BRCA 1/2 Neg Hx      Current Outpatient Medications:    acetaminophen (TYLENOL) 500 MG tablet, Take 1,000 mg by mouth every 6 (six) hours as needed (for pain.)., Disp: , Rfl:    Cranberry 300 MG tablet, Take 300 mg by mouth 2 (two) times daily., Disp: , Rfl:    fluticasone  (FLONASE) 50 MCG/ACT nasal spray, Place 1 spray into both nostrils daily. otc, Disp: , Rfl:    lisinopril (ZESTRIL) 5 MG tablet, Take 1 tablet (5 mg total) by mouth daily., Disp: 90 tablet, Rfl: 1   loratadine (CLARITIN) 10 MG tablet, Take 10 mg by mouth daily. otc, Disp: , Rfl:    LORazepam (ATIVAN) 0.5 MG tablet, TAKE 1 TABLET (0.5 MG TOTAL) BY MOUTH EVERY 6 (SIX) HOURS AS NEEDED (NAUSEA/VOMITING OR ANXIETY)., Disp: 90 tablet, Rfl: 0   MYRBETRIQ 50 MG TB24 tablet, TAKE 1 TABLET BY MOUTH EVERY DAY, Disp: 30 tablet, Rfl: 11   pregabalin (LYRICA) 75 MG capsule, Take 1 capsule (75 mg total) by mouth 2 (two) times daily., Disp: 60 capsule, Rfl: 3 No current facility-administered medications for this visit.  Facility-Administered Medications Ordered in Other Visits:    sodium chloride flush (NS) 0.9 % injection 10 mL, 10 mL, Intravenous, PRN, Sindy Guadeloupe, MD, 10 mL at 01/05/20 0750  Physical exam:  Vitals:   05/19/21 1407 05/19/21 1411  BP:  (!) 141/75  Pulse:  72  Resp:  18  Temp: 99.1 F (37.3 C)   TempSrc: Tympanic   Weight: 182 lb (82.6 kg)    Physical Exam Constitutional:      General: She is not in acute distress. Cardiovascular:     Rate and Rhythm: Normal rate and regular rhythm.     Heart sounds: Normal heart sounds.  Pulmonary:     Effort: Pulmonary effort is normal.     Breath sounds: Normal breath sounds.  Abdominal:     General: Bowel sounds are normal.     Palpations: Abdomen is soft.  Skin:    General: Skin is warm and dry.  Neurological:     Mental Status: She is alert and oriented to person, place, and time.     CMP Latest Ref Rng & Units 05/19/2021  Glucose 70 - 99 mg/dL 100(H)  BUN 8 - 23 mg/dL 10  Creatinine 0.44 - 1.00 mg/dL 0.67  Sodium 135 - 145 mmol/L 136  Potassium 3.5 - 5.1 mmol/L 3.7  Chloride 98 - 111 mmol/L 105  CO2 22 - 32 mmol/L 26  Calcium 8.9 - 10.3 mg/dL 9.8  Total Protein 6.5 - 8.1 g/dL 6.8  Total Bilirubin  0.3 - 1.2 mg/dL 0.4   Alkaline Phos 38 - 126 U/L 82  AST 15 - 41 U/L 16  ALT 0 - 44 U/L 16   CBC Latest Ref Rng & Units 05/19/2021  WBC 4.0 - 10.5 K/uL 5.9  Hemoglobin 12.0 - 15.0 g/dL 14.1  Hematocrit 36.0 - 46.0 % 42.2  Platelets 150 - 400 K/uL 233    No images are attached to the encounter.  MM 3D SCREEN BREAST BILATERAL  Result Date: 04/26/2021 CLINICAL DATA:  Screening. EXAM: DIGITAL SCREENING BILATERAL MAMMOGRAM WITH TOMOSYNTHESIS AND CAD TECHNIQUE: Bilateral screening digital craniocaudal and mediolateral oblique mammograms were obtained. Bilateral screening digital breast tomosynthesis was performed. The images were evaluated with computer-aided detection. COMPARISON:  Previous exam(s). ACR Breast Density Category b: There are scattered areas of fibroglandular density. FINDINGS: There are no findings suspicious for malignancy. IMPRESSION: No mammographic evidence of malignancy. A result letter of this screening mammogram will be mailed directly to the patient. RECOMMENDATION: Screening mammogram in one year. (Code:SM-B-01Y) BI-RADS CATEGORY  1: Negative. Electronically Signed   By: Franki Cabot M.D.   On: 04/26/2021 08:37    Assessment and plan- Patient is a 75 y.o. 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  She is s/p 6 cycles of CarboTaxol chemotherapy adjuvantly which she completed in June 2021.  She has been in remission presently and this is a routine follow-up visit  Clinically patient is doing well with no concerning signs and symptoms of recurrence based on today's exam.  Patient was also seen by Dr. Fransisca Connors last month and underwent pelvic exams which were unremarkable.  No indication for imaging at this time unless suspicious signs and symptoms.  Dysuria/urgency: UA was checked today which was positive.  I will send her a prescription for nitrofurantoin 100 mg twice daily for 5 days.  Urine cultures are pending.  She has multiple drug allergies  butAppears to have received this prescription from Dr. Carmon Ginsberg back in May 2022  I will see him back in 6 months with CBC with differential CMP and CA125   Visit Diagnosis 1. Dysuria   2. Urine frequency      Dr. Randa Evens, MD, MPH [facility] [phone] 05/19/2021 3:35 PM

## 2021-05-19 NOTE — Progress Notes (Signed)
Patient here today for follow up with labs. She states that she has been doing very well, except she thinks she has developed a UTI. She is complaining of dysuria and frequency. She is taking azo with a little relief. We will collect a ua and urine culture today.

## 2021-05-20 LAB — CA 125: Cancer Antigen (CA) 125: 8.4 U/mL (ref 0.0–38.1)

## 2021-05-22 LAB — URINE CULTURE: Culture: >=100000 — AB

## 2021-06-05 ENCOUNTER — Ambulatory Visit (INDEPENDENT_AMBULATORY_CARE_PROVIDER_SITE_OTHER): Payer: Medicare Other | Admitting: Obstetrics and Gynecology

## 2021-06-05 ENCOUNTER — Other Ambulatory Visit: Payer: Self-pay

## 2021-06-05 ENCOUNTER — Encounter: Payer: Self-pay | Admitting: Obstetrics and Gynecology

## 2021-06-05 VITALS — BP 146/80 | Ht 62.5 in | Wt 180.0 lb

## 2021-06-05 DIAGNOSIS — N644 Mastodynia: Secondary | ICD-10-CM | POA: Diagnosis not present

## 2021-06-05 DIAGNOSIS — Z9189 Other specified personal risk factors, not elsewhere classified: Secondary | ICD-10-CM

## 2021-06-05 DIAGNOSIS — Z803 Family history of malignant neoplasm of breast: Secondary | ICD-10-CM

## 2021-06-05 DIAGNOSIS — C561 Malignant neoplasm of right ovary: Secondary | ICD-10-CM

## 2021-06-05 NOTE — Progress Notes (Signed)
Juline Patch, MD   Chief Complaint  Patient presents with   Breast Exam    Right breast only, like a pulling sensation x 1 week and a half    HPI:      Ms. Carrie Roberson is a 75 y.o. G0P0000 whose LMP was No LMP recorded. Patient has had a hysterectomy., presents today for RT breast pulling sensation/tenderness for 1 1/2 wks. Starts towards the top and moves to the nipple. No masses, erythema, nipple d/c, skin changes. Feels like muscle pull and notices more with coughing and certain movements. Sx increased with wearing bra compared to braless. Neg mammo 04/20/21 at [facility]. Does occas SBE.  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   Genetic testing 11/02/2019   Weight loss, unintentional 10/16/2019   Chemotherapy induced diarrhea 10/13/2019   Orthostasis 10/13/2019   Hypokalemia 10/13/2019   Restless leg 10/13/2019   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 10/13/2019   Iron deficiency 10/02/2019   Anxiety associated with cancer diagnosis ([place]) 10/02/2019   Chemotherapy-induced nausea 10/02/2019   Thyroid nodule 10/02/2019   Hyponatremia 10/02/2019   B12 deficiency 09/18/2019   Family history of breast cancer    Family history of leukemia    Goals of care, counseling/discussion 09/07/2019   Ovarian mass 08/19/2019   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) 08/19/2019   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HCC)    Cyst of ovary 07/15/2019   Anxiety 09/02/2017   DDD (degenerative disc disease), 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   FH: diabetes mellitus 09/02/2017   Obesity, Class I, BMI 30.0-34.9 (see actual BMI) 09/02/2017   Atypical nevus  09/02/2017   IBS (irritable bowel syndrome) 09/02/2017   Atrophic vaginitis 09/02/2017   Chronic idiopathic urticaria 09/02/2017   GERD (gastroesophageal reflux disease) 09/02/2017    Past Surgical History:  Procedure Laterality Date   ABDOMINAL HYSTERECTOMY     BREAST BIOPSY Left    needle bx/clip-neg   CERVICAL BIOPSY  W/ LOOP ELECTRODE EXCISION     CHOLECYSTECTOMY     Colonoscopoy  2007, 2008, 2011   COLONOSCOPY WITH PROPOFOL N/A 05/05/2018   Procedure: COLONOSCOPY WITH PROPOFOL;  Surgeon: Lin Landsman, MD;  Location: [facility] ENDOSCOPY;  Service: Gastroenterology;  Laterality: N/A;   HYSTERECTOMY ABDOMINAL WITH SALPINGO-OOPHORECTOMY N/A 08/19/2019   Procedure: HYSTERECTOMY ABDOMINAL WITH SALPINGO-OOPHORECTOMY WITH PELVIC WASHINGS PERITONEAL BIOPSIES AND PARA AORTIC LYMPH NODE DISSECTION;  Surgeon: [name], MD;  Location: [facility] ORS;  Service: Gynecology;  Laterality: N/A;   LEEP     OMENTECTOMY N/A 08/19/2019   Procedure: OMENTECTOMY;  Surgeon: [name], MD;  Location: [facility] ORS;  Service: Gynecology;  Laterality: N/A;   OOPHORECTOMY     PORTA CATH INSERTION N/A 09/11/2019   Procedure: PORTA CATH INSERTION;  Surgeon: Algernon Huxley, MD;  Location: [facility];  Service: Cardiovascular;  Laterality: N/A;   TUBAL LIGATION  1999    Family History  Problem Relation Age of Onset   Heart failure Mother    Atrial fibrillation Mother    Hypertension Mother    Diabetes Father    Heart attack Father    Breast cancer  Maternal Aunt 19   Breast cancer Maternal Grandmother 47   Breast cancer Cousin        2 mat cousins   Hypertension Sister    Diabetes Sister    Blindness Sister    Hypertension Sister    Arthritis Sister    Leukemia Paternal Aunt    Leukemia Paternal Uncle    Brain cancer Paternal Aunt    Other Brother 80       Drowning accident   Liver cancer Maternal Uncle    Alcohol abuse Maternal Uncle    Prostate cancer Neg Hx    Kidney cancer Neg  Hx    BRCA 1/2 Neg Hx     Social History   Socioeconomic History   Marital status: Single    Spouse name: Not on file   Number of children: 0   Years of education: Not on file   Highest education level: Associate degree: academic program  Occupational History   Occupation: Retired  Tobacco Use   Smoking status: Never   Smokeless tobacco: Never   Tobacco comments:    2bd hand smoke from father  Vaping Use   Vaping Use: Never used  Substance and Sexual Activity   Alcohol use: Yes    Comment: rarely   Drug use: No   Sexual activity: Never    Birth control/protection: Post-menopausal  Other Topics Concern   Not on file  Social History Narrative   Not on file   Social Determinants of Health   Financial Resource Strain: Not on file  Food Insecurity: Not on file  Transportation Needs: Not on file  Physical Activity: Not on file  Stress: Not on file  Social Connections: Not on file  Intimate Partner Violence: Not on file    Outpatient Medications Prior to Visit  Medication Sig Dispense Refill   acetaminophen (TYLENOL) 500 MG tablet Take 1,000 mg by mouth every 6 (six) hours as needed (for pain.).     Cranberry 300 MG tablet Take 300 mg by mouth 2 (two) times daily.     fluticasone (FLONASE) 50 MCG/ACT nasal spray Place 1 spray into both nostrils daily. otc     lisinopril (ZESTRIL) 5 MG tablet Take 1 tablet (5 mg total) by mouth daily. 90 tablet 1   loratadine (CLARITIN) 10 MG tablet Take 10 mg by mouth daily. otc     LORazepam (ATIVAN) 0.5 MG tablet TAKE 1 TABLET (0.5 MG TOTAL) BY MOUTH EVERY 6 (SIX) HOURS AS NEEDED (NAUSEA/VOMITING OR ANXIETY). 90 tablet 0   MYRBETRIQ 50 MG TB24 tablet TAKE 1 TABLET BY MOUTH EVERY DAY 30 tablet 11   pregabalin (LYRICA) 75 MG capsule Take 1 capsule (75 mg total) by mouth 2 (two) times daily. 60 capsule 3   nitrofurantoin, macrocrystal-monohydrate, (MACROBID) 100 MG capsule Take 1 capsule (100 mg total) by mouth 2 (two) times daily. 10  capsule 0   Facility-Administered Medications Prior to Visit  Medication Dose Route Frequency Provider Last Rate Last Admin   sodium chloride flush (NS) 0.9 % injection 10 mL  10 mL Intravenous PRN Sindy Guadeloupe, MD   10 mL at 01/05/20 0750      ROS:  Review of Systems  Constitutional:  Negative for fever.  Gastrointestinal:  Negative for blood in stool, constipation, diarrhea, nausea and vomiting.  Genitourinary:  Negative for dyspareunia, dysuria, flank pain, frequency, hematuria, urgency, vaginal bleeding, vaginal discharge and vaginal pain.  Musculoskeletal:  Negative for back pain.  Skin:  Negative for rash.  BREAST: changes   OBJECTIVE:   Vitals:  BP (!) 146/80   Ht 5' 2.5" (1.588 m)   Wt 180 lb (81.6 kg)   BMI 32.40 kg/m   Physical Exam Vitals reviewed.  Pulmonary:     Effort: Pulmonary effort is normal.  Chest:  Breasts:    Breasts are symmetrical.     Right: No inverted nipple, mass, nipple discharge, skin change or tenderness.     Left: No inverted nipple, mass, nipple discharge, skin change or tenderness.    Musculoskeletal:        General: Normal range of motion.     Cervical back: Normal range of motion.  Skin:    General: Skin is warm and dry.  Neurological:     General: No focal deficit present.     Mental Status: She is alert and oriented to person, place, and time.     Cranial Nerves: No cranial nerve deficit.  Psychiatric:        Mood and Affect: Mood normal.        Behavior: Behavior normal.        Thought Content: Thought content normal.        Judgment: Judgment normal.    Assessment/Plan:  Breast pain, right - Plan: US BREAST LTD UNI RIGHT INC AXILLA, MM DIAG BREAST TOMO UNI RIGHT; Neg exam for masses except slight tenderness; given personal and FH, check dx mammo and u/s, pt to call to sched. Will f/u with results  Family history of breast cancer - Plan: US BREAST LTD UNI RIGHT INC AXILLA, MM DIAG BREAST TOMO UNI RIGHT  Increased  risk of breast cancer - Plan: US BREAST LTD UNI RIGHT INC AXILLA, MM DIAG BREAST TOMO UNI RIGHT  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[place])     Return if symptoms worsen or fail to improve.  Daisey Caloca B. Trygve Thal, PA-C 06/05/2021 12:01 PM

## 2021-06-19 ENCOUNTER — Ambulatory Visit: Payer: Medicare Other | Admitting: Obstetrics and Gynecology

## 2021-06-19 ENCOUNTER — Ambulatory Visit
Admission: RE | Admit: 2021-06-19 | Discharge: 2021-06-19 | Disposition: A | Discharge status: Home / Self Care | Payer: Medicare Other | Source: Ambulatory Visit | Attending: Obstetrics and Gynecology | Admitting: Obstetrics and Gynecology

## 2021-06-19 ENCOUNTER — Other Ambulatory Visit: Payer: Self-pay

## 2021-06-19 DIAGNOSIS — Z803 Family history of malignant neoplasm of breast: Secondary | ICD-10-CM

## 2021-06-19 DIAGNOSIS — N644 Mastodynia: Secondary | ICD-10-CM

## 2021-06-19 DIAGNOSIS — R922 Inconclusive mammogram: Secondary | ICD-10-CM | POA: Diagnosis not present

## 2021-06-19 DIAGNOSIS — Z9189 Other specified personal risk factors, not elsewhere classified: Secondary | ICD-10-CM | POA: Insufficient documentation

## 2021-06-20 ENCOUNTER — Other Ambulatory Visit: Payer: Self-pay | Admitting: Obstetrics and Gynecology

## 2021-06-20 DIAGNOSIS — R928 Other abnormal and inconclusive findings on diagnostic imaging of breast: Secondary | ICD-10-CM

## 2021-06-20 DIAGNOSIS — N6310 Unspecified lump in the right breast, unspecified quadrant: Secondary | ICD-10-CM

## 2021-06-28 ENCOUNTER — Encounter (HOSPITAL_COMMUNITY): Payer: Self-pay | Admitting: Diagnostic Radiology

## 2021-06-28 ENCOUNTER — Ambulatory Visit
Admission: RE | Admit: 2021-06-28 | Discharge: 2021-06-28 | Disposition: A | Discharge status: Home / Self Care | Payer: Medicare Other | Source: Ambulatory Visit | Attending: Obstetrics and Gynecology | Admitting: Obstetrics and Gynecology

## 2021-06-28 ENCOUNTER — Other Ambulatory Visit: Payer: Self-pay

## 2021-06-28 DIAGNOSIS — R928 Other abnormal and inconclusive findings on diagnostic imaging of breast: Secondary | ICD-10-CM

## 2021-06-28 DIAGNOSIS — N641 Fat necrosis of breast: Secondary | ICD-10-CM | POA: Diagnosis not present

## 2021-06-28 DIAGNOSIS — N6310 Unspecified lump in the right breast, unspecified quadrant: Secondary | ICD-10-CM | POA: Insufficient documentation

## 2021-06-28 HISTORY — PX: BREAST BIOPSY: SHX20

## 2021-06-29 LAB — SURGICAL PATHOLOGY

## 2021-07-04 ENCOUNTER — Ambulatory Visit (INDEPENDENT_AMBULATORY_CARE_PROVIDER_SITE_OTHER): Payer: Medicare Other | Admitting: Physician Assistant

## 2021-07-04 ENCOUNTER — Other Ambulatory Visit: Payer: Self-pay

## 2021-07-04 ENCOUNTER — Encounter: Payer: Self-pay | Admitting: Physician Assistant

## 2021-07-04 VITALS — BP 138/75 | HR 66 | Ht 63.0 in | Wt 178.0 lb

## 2021-07-04 DIAGNOSIS — N3281 Overactive bladder: Secondary | ICD-10-CM | POA: Diagnosis not present

## 2021-07-04 LAB — BLADDER SCAN AMB NON-IMAGING

## 2021-07-04 MED ORDER — MIRABEGRON ER 50 MG PO TB24
50.0000 mg | ORAL_TABLET | Freq: Every day | ORAL | 11 refills | Status: DC
Start: 2021-07-04 — End: 2022-08-02

## 2021-07-04 NOTE — Progress Notes (Signed)
07/04/2021 1:47 PM   Carrie Roberson 05/11/1946 [id number]  CC: Chief Complaint  Patient presents with   Urinary Frequency   HPI: Carrie Roberson is a 75 y.o. 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   We saw her in clinic most recently 6 months ago for cystoscopy with Dr. SDiamantina Providence  At the time, she was having some bothersome persistent dysuria and frequency after receiving culture appropriate antibiotics for E. coli UTI with follow-up cultures negative.  She subsequently reported resolution of her dysuria.  Today she reports her urinary symptoms have returned to baseline and she remains well controlled on Myrbetriq 50 mg daily.  She notes she hit the donut hole on her insurance last month and her Myrbetriq is now costing $140 a month, though this is expected to reset in the new year.  She was treated for a pansensitive E. coli UTI last month, but notes no persistent symptoms.  PVR 34 mL.  PMH: Past Medical History:  Diagnosis Date   Anxiety    Arthritis    Diverticulitis    Family history of breast cancer    Family history of leukemia    GERD (gastroesophageal reflux disease)    History of ischemic colitis 09/02/2017   Hx of colonic polyp    Hypertension    H/O-PT TAKEN OFF BY PCP AS OF 2019   IBS (irritable bowel syndrome)    Ovarian cancer ([place]    Ovarian cyst    Personal history of chemotherapy 2021   6 treatments   Vaginal atrophy     Surgical History: Past Surgical History:  Procedure Laterality Date   ABDOMINAL HYSTERECTOMY     BREAST BIOPSY Left    needle bx/clip-neg   CERVICAL BIOPSY  W/ LOOP ELECTRODE EXCISION     CHOLECYSTECTOMY     Colonoscopoy  2007, 2008, 2011   COLONOSCOPY WITH PROPOFOL N/A 05/05/2018   Procedure: COLONOSCOPY WITH PROPOFOL;  Surgeon:  VLin Landsman MD;  Location: [facility] ENDOSCOPY;  Service: Gastroenterology;  Laterality: N/A;   HYSTERECTOMY ABDOMINAL WITH SALPINGO-OOPHORECTOMY N/A 08/19/2019   Procedure: HYSTERECTOMY ABDOMINAL WITH SALPINGO-OOPHORECTOMY WITH PELVIC WASHINGS PERITONEAL BIOPSIES AND PARA AORTIC LYMPH NODE DISSECTION;  Surgeon: SMalachy Mood MD;  Location: [facility] ORS;  Service: Gynecology;  Laterality: N/A;   LEEP     OMENTECTOMY N/A 08/19/2019   Procedure: OMENTECTOMY;  Surgeon: SMalachy Mood MD;  Location: [facility] ORS;  Service: Gynecology;  Laterality: N/A;   OOPHORECTOMY     PORTA CATH INSERTION N/A 09/11/2019   Procedure: PORTA CATH INSERTION;  Surgeon: DAlgernon Huxley MD;  [facility];  Service: Cardiovascular;  Laterality: N/A;   TUBAL LIGATION  1999    Home Medications:  Allergies as of 07/04/2021       Reactions   Citalopram Nausea And Vomiting   Ivp Dye [iodinated Diagnostic Agents] Hives, Swelling   hives   Meloxicam    unknown   Adhesive [tape] Rash   Amlodipine Rash   Ampicillin Rash   Did it involve swelling of the face/tongue/throat, SOB, or low BP? No Did it involve sudden or severe rash/hives, skin peeling, or any reaction on the inside of your mouth or nose? Yes Did you need to seek medical attention at a hospital or doctor's office? Yes When did it last happen?  10 + years If all above answers are NO, may proceed with cephalosporin use.   Cefoxitin Rash   Chlorhexidine Rash   Chloraprep Scrub Teal   Estradiol Rash   Estropipate Rash   Olmesartan Medoxomil-hctz Rash   Sertraline Rash      Sulfa Antibiotics Rash        Medication List        Accurate as of July 04, 2021  1:47 PM. If you have any questions, ask your nurse or doctor.          acetaminophen 500 MG tablet Commonly known as: TYLENOL Take 1,000 mg by mouth every 6 (six) hours as needed (for pain.).   Cranberry 300 MG tablet Take 300 mg by mouth 2 (two) times  daily.   fluticasone 50 MCG/ACT nasal spray Commonly known as: FLONASE Place 1 spray into both nostrils daily. otc   lisinopril 5 MG tablet Commonly known as: ZESTRIL Take 1 tablet (5 mg total) by mouth daily.   loratadine 10 MG tablet Commonly known as: CLARITIN Take 10 mg by mouth daily. otc   LORazepam 0.5 MG tablet Commonly known as: ATIVAN TAKE 1 TABLET (0.5 MG TOTAL) BY MOUTH EVERY 6 (SIX) HOURS AS NEEDED (NAUSEA/VOMITING OR ANXIETY).   mirabegron ER 50 MG Tb24 tablet Commonly known as: Myrbetriq Take 1 tablet (50 mg total) by mouth daily. What changed: how much to take Changed by: Debroah Loop, PA-C   pregabalin 75 MG capsule Commonly known as: Lyrica Take 1 capsule (75 mg total) by mouth 2 (two) times daily.        Allergies:  Allergies  Allergen Reactions   Citalopram Nausea And Vomiting   Ivp Dye [Iodinated Diagnostic Agents] Hives and Swelling    hives   Meloxicam     unknown   Adhesive [Tape] Rash   Amlodipine Rash   Ampicillin Rash    Did it involve swelling of the face/tongue/throat, SOB, or low BP? No Did it involve sudden or severe rash/hives, skin peeling, or any reaction on the inside of your mouth or nose? Yes Did you need to seek medical attention at a hospital or doctor's office? Yes When did it last happen?       10 + years If all above answers are NO, may proceed with cephalosporin use.   Cefoxitin Rash   Chlorhexidine Rash    Chloraprep Scrub Teal   Estradiol Rash   Estropipate Rash   Olmesartan Medoxomil-Hctz Rash   Sertraline Rash        Sulfa Antibiotics Rash    Family History: Family History  Problem Relation Age of Onset   Heart failure Mother    Atrial fibrillation Mother    Hypertension Mother    Diabetes Father    Heart attack Father    Breast cancer Maternal Aunt 75   Breast cancer Maternal Grandmother 26   Breast cancer Cousin        2 mat cousins   Hypertension Sister    Diabetes Sister     Blindness Sister    Hypertension Sister    Arthritis Sister    Leukemia Paternal Aunt    Leukemia Paternal Uncle    Brain cancer Paternal Aunt    Other Brother 99       Drowning accident   Liver cancer Maternal Uncle    Alcohol abuse Maternal Uncle    Prostate cancer Neg Hx    Kidney cancer Neg Hx    BRCA 1/2 Neg  Hx     Social History:   reports that she has never smoked. She has never used smokeless tobacco. She reports current alcohol use. She reports that she does not use drugs.  Physical Exam: BP 138/75    Pulse 66    Ht 5' 3"  (1.6 m)    Wt 178 lb (80.7 kg)    BMI 31.53 kg/m   Constitutional:  Alert and oriented, no acute distress, nontoxic appearing HEENT: [place], AT Cardiovascular: No clubbing, cyanosis, or edema Respiratory: Normal respiratory effort, no increased work of breathing Skin: No rashes, bruises or suspicious lesions Neurologic: Grossly intact, no focal deficits, moving all 4 extremities Psychiatric: Normal mood and affect  Laboratory Data: Results for orders placed or performed in visit on 07/04/21  Bladder Scan (Post Void Residual) in office  Result Value Ref Range   Scan Result 51m    Assessment & Plan:   1. OAB (overactive bladder) Symptoms have returned to baseline and are well controlled on Myrbetriq 50 mg daily, we will refill this for 1 year.  Additionally, I gave her 4 weeks of 50 mg samples today to reduce her out-of-pocket drug costs for the month. - Bladder Scan (Post Void Residual) in office - mirabegron ER (MYRBETRIQ) 50 MG TB24 tablet; Take 1 tablet (50 mg total) by mouth daily.  Dispense: 30 tablet; Refill: 11  Return in about 1 year (around 07/04/2022) for Annual OAB f/u with P[facility].  SDebroah Loop PA-C  [facility] [address] [place] [phone]

## 2021-07-19 ENCOUNTER — Inpatient Hospital Stay
Admission: EM | Admit: 2021-07-19 | Discharge: 2021-07-21 | DRG: 395 | Disposition: A | Discharge status: Home / Self Care | Payer: Medicare Other | Attending: Internal Medicine | Admitting: Internal Medicine

## 2021-07-19 ENCOUNTER — Other Ambulatory Visit: Payer: Self-pay

## 2021-07-19 ENCOUNTER — Emergency Department: Payer: Medicare Other

## 2021-07-19 ENCOUNTER — Telehealth: Payer: Self-pay | Admitting: Gastroenterology

## 2021-07-19 ENCOUNTER — Telehealth: Payer: Self-pay

## 2021-07-19 DIAGNOSIS — K5730 Diverticulosis of large intestine without perforation or abscess without bleeding: Secondary | ICD-10-CM | POA: Diagnosis present

## 2021-07-19 DIAGNOSIS — K559 Vascular disorder of intestine, unspecified: Principal | ICD-10-CM | POA: Diagnosis present

## 2021-07-19 DIAGNOSIS — Z9221 Personal history of antineoplastic chemotherapy: Secondary | ICD-10-CM | POA: Diagnosis not present

## 2021-07-19 DIAGNOSIS — M5134 Other intervertebral disc degeneration, thoracic region: Secondary | ICD-10-CM | POA: Diagnosis present

## 2021-07-19 DIAGNOSIS — K219 Gastro-esophageal reflux disease without esophagitis: Secondary | ICD-10-CM | POA: Diagnosis present

## 2021-07-19 DIAGNOSIS — Z8249 Family history of ischemic heart disease and other diseases of the circulatory system: Secondary | ICD-10-CM

## 2021-07-19 DIAGNOSIS — N3281 Overactive bladder: Secondary | ICD-10-CM | POA: Diagnosis present

## 2021-07-19 DIAGNOSIS — K529 Noninfective gastroenteritis and colitis, unspecified: Secondary | ICD-10-CM | POA: Diagnosis not present

## 2021-07-19 DIAGNOSIS — Z800 Family history of malignant neoplasm of digestive organs: Secondary | ICD-10-CM | POA: Diagnosis not present

## 2021-07-19 DIAGNOSIS — Z808 Family history of malignant neoplasm of other organs or systems: Secondary | ICD-10-CM

## 2021-07-19 DIAGNOSIS — K558 Other vascular disorders of intestine: Secondary | ICD-10-CM | POA: Diagnosis not present

## 2021-07-19 DIAGNOSIS — I10 Essential (primary) hypertension: Secondary | ICD-10-CM | POA: Diagnosis not present

## 2021-07-19 DIAGNOSIS — Z90722 Acquired absence of ovaries, bilateral: Secondary | ICD-10-CM | POA: Diagnosis not present

## 2021-07-19 DIAGNOSIS — C801 Malignant (primary) neoplasm, unspecified: Secondary | ICD-10-CM | POA: Diagnosis present

## 2021-07-19 DIAGNOSIS — Z86010 Personal history of colonic polyps: Secondary | ICD-10-CM | POA: Diagnosis not present

## 2021-07-19 DIAGNOSIS — N281 Cyst of kidney, acquired: Secondary | ICD-10-CM | POA: Diagnosis not present

## 2021-07-19 DIAGNOSIS — Z8261 Family history of arthritis: Secondary | ICD-10-CM

## 2021-07-19 DIAGNOSIS — Z8543 Personal history of malignant neoplasm of ovary: Secondary | ICD-10-CM | POA: Diagnosis not present

## 2021-07-19 DIAGNOSIS — F419 Anxiety disorder, unspecified: Secondary | ICD-10-CM | POA: Diagnosis present

## 2021-07-19 DIAGNOSIS — Z821 Family history of blindness and visual loss: Secondary | ICD-10-CM

## 2021-07-19 DIAGNOSIS — Z806 Family history of leukemia: Secondary | ICD-10-CM | POA: Diagnosis not present

## 2021-07-19 DIAGNOSIS — Z20822 Contact with and (suspected) exposure to covid-19: Secondary | ICD-10-CM | POA: Diagnosis present

## 2021-07-19 DIAGNOSIS — Z1211 Encounter for screening for malignant neoplasm of colon: Secondary | ICD-10-CM | POA: Diagnosis not present

## 2021-07-19 DIAGNOSIS — K589 Irritable bowel syndrome without diarrhea: Secondary | ICD-10-CM | POA: Diagnosis present

## 2021-07-19 DIAGNOSIS — Z833 Family history of diabetes mellitus: Secondary | ICD-10-CM

## 2021-07-19 DIAGNOSIS — Z803 Family history of malignant neoplasm of breast: Secondary | ICD-10-CM | POA: Diagnosis not present

## 2021-07-19 DIAGNOSIS — Z6831 Body mass index (BMI) 31.0-31.9, adult: Secondary | ICD-10-CM

## 2021-07-19 DIAGNOSIS — K6389 Other specified diseases of intestine: Secondary | ICD-10-CM | POA: Diagnosis not present

## 2021-07-19 DIAGNOSIS — E669 Obesity, unspecified: Secondary | ICD-10-CM | POA: Diagnosis present

## 2021-07-19 DIAGNOSIS — R933 Abnormal findings on diagnostic imaging of other parts of digestive tract: Secondary | ICD-10-CM | POA: Diagnosis not present

## 2021-07-19 DIAGNOSIS — E66811 Obesity, class 1: Secondary | ICD-10-CM | POA: Diagnosis present

## 2021-07-19 DIAGNOSIS — K3189 Other diseases of stomach and duodenum: Secondary | ICD-10-CM | POA: Diagnosis not present

## 2021-07-19 DIAGNOSIS — Z9049 Acquired absence of other specified parts of digestive tract: Secondary | ICD-10-CM

## 2021-07-19 DIAGNOSIS — K921 Melena: Secondary | ICD-10-CM | POA: Diagnosis not present

## 2021-07-19 DIAGNOSIS — Z8719 Personal history of other diseases of the digestive system: Secondary | ICD-10-CM | POA: Diagnosis not present

## 2021-07-19 DIAGNOSIS — T451X5A Adverse effect of antineoplastic and immunosuppressive drugs, initial encounter: Secondary | ICD-10-CM | POA: Diagnosis present

## 2021-07-19 DIAGNOSIS — G620 Drug-induced polyneuropathy: Secondary | ICD-10-CM | POA: Diagnosis present

## 2021-07-19 DIAGNOSIS — Z79899 Other long term (current) drug therapy: Secondary | ICD-10-CM | POA: Diagnosis not present

## 2021-07-19 DIAGNOSIS — C561 Malignant neoplasm of right ovary: Secondary | ICD-10-CM | POA: Diagnosis present

## 2021-07-19 DIAGNOSIS — Z90710 Acquired absence of both cervix and uterus: Secondary | ICD-10-CM

## 2021-07-19 DIAGNOSIS — D72828 Other elevated white blood cell count: Secondary | ICD-10-CM | POA: Diagnosis not present

## 2021-07-19 LAB — COMPREHENSIVE METABOLIC PANEL
ALT: 28 U/L (ref 0–44)
AST: 25 U/L (ref 15–41)
Albumin: 3.9 g/dL (ref 3.5–5.0)
Alkaline Phosphatase: 104 U/L (ref 38–126)
Anion gap: 6 (ref 5–15)
BUN: 11 mg/dL (ref 8–23)
CO2: 24 mmol/L (ref 22–32)
Calcium: 10.1 mg/dL (ref 8.9–10.3)
Chloride: 106 mmol/L (ref 98–111)
Creatinine, Ser: 0.7 mg/dL (ref 0.44–1.00)
GFR, Estimated: >60 mL/min (ref >60)
Glucose, Bld: 123 mg/dL — ABNORMAL HIGH (ref 70–99)
Potassium: 4 mmol/L (ref 3.5–5.1)
Sodium: 136 mmol/L (ref 135–145)
Total Bilirubin: 0.8 mg/dL (ref 0.3–1.2)
Total Protein: 6.9 g/dL (ref 6.5–8.1)

## 2021-07-19 LAB — CBC
HCT: 45.2 % (ref 36.0–46.0)
Hemoglobin: 15.1 g/dL — ABNORMAL HIGH (ref 12.0–15.0)
MCH: 30.7 pg (ref 26.0–34.0)
MCHC: 33.4 g/dL (ref 30.0–36.0)
MCV: 91.9 fL (ref 80.0–100.0)
Platelets: 235 10*3/uL (ref 150–400)
RBC: 4.92 MIL/uL (ref 3.87–5.11)
RDW: 12.9 % (ref 11.5–15.5)
WBC: 12.3 10*3/uL — ABNORMAL HIGH (ref 4.0–10.5)
nRBC: 0 % (ref 0.0–0.2)

## 2021-07-19 LAB — RESP PANEL BY RT-PCR (FLU A&B, COVID) ARPGX2
Influenza A by PCR: NEGATIVE
Influenza B by PCR: NEGATIVE
SARS Coronavirus 2 by RT PCR: NEGATIVE

## 2021-07-19 LAB — HEMOGLOBIN AND HEMATOCRIT, BLOOD
HCT: 45 % (ref 36.0–46.0)
Hemoglobin: 14.9 g/dL (ref 12.0–15.0)

## 2021-07-19 LAB — TYPE AND SCREEN
ABO/RH(D): A POS
Antibody Screen: NEGATIVE

## 2021-07-19 LAB — TSH: TSH: 1.535 u[IU]/mL (ref 0.350–4.500)

## 2021-07-19 LAB — LACTIC ACID, PLASMA
Lactic Acid, Venous: 1.1 mmol/L (ref 0.5–1.9)
Lactic Acid, Venous: 1.5 mmol/L (ref 0.5–1.9)

## 2021-07-19 LAB — PHOSPHORUS: Phosphorus: 2.8 mg/dL (ref 2.5–4.6)

## 2021-07-19 LAB — MAGNESIUM: Magnesium: 2 mg/dL (ref 1.7–2.4)

## 2021-07-19 MED ORDER — DIPHENHYDRAMINE HCL 50 MG/ML IJ SOLN: 50.0000 mg | Freq: Once | INTRAMUSCULAR | Status: DC

## 2021-07-19 MED ORDER — CIPROFLOXACIN IN D5W 400 MG/200ML IV SOLN
400.0000 mg | Freq: Two times a day (BID) | INTRAVENOUS | Status: DC
  Administered 2021-07-19 – 2021-07-20 (×2): 400 mg via INTRAVENOUS
  Filled 2021-07-19 (×2): qty 200

## 2021-07-19 MED ORDER — DIPHENHYDRAMINE HCL 25 MG PO CAPS: 50.0000 mg | ORAL_CAPSULE | Freq: Once | ORAL | Status: DC

## 2021-07-19 MED ORDER — ACETAMINOPHEN 500 MG PO TABS
1000.0000 mg | ORAL_TABLET | Freq: Four times a day (QID) | ORAL | Status: DC | PRN
  Administered 2021-07-21 (×2): 1000 mg via ORAL
  Filled 2021-07-19 (×2): qty 2

## 2021-07-19 MED ORDER — HYDROCORTISONE SOD SUC (PF) 250 MG IJ SOLR
200.0000 mg | Freq: Once | INTRAMUSCULAR | Status: DC
  Filled 2021-07-19: qty 200

## 2021-07-19 MED ORDER — METRONIDAZOLE 500 MG/100ML IV SOLN
500.0000 mg | Freq: Three times a day (TID) | INTRAVENOUS | Status: DC
  Administered 2021-07-19: 18:00:00 500 mg via INTRAVENOUS
  Filled 2021-07-19: qty 100

## 2021-07-19 MED ORDER — ONDANSETRON HCL 4 MG/2ML IJ SOLN
4.0000 mg | Freq: Four times a day (QID) | INTRAMUSCULAR | Status: DC | PRN
  Administered 2021-07-20: 06:00:00 4 mg via INTRAVENOUS
  Filled 2021-07-19: qty 2

## 2021-07-19 MED ORDER — ACETAMINOPHEN 650 MG RE SUPP: 650.0000 mg | Freq: Four times a day (QID) | RECTAL | Status: DC | PRN

## 2021-07-19 MED ORDER — SODIUM CHLORIDE 0.9 % IV SOLN: Freq: Once | INTRAVENOUS | Status: AC

## 2021-07-19 MED ORDER — PEG 3350-KCL-NA BICARB-NACL 420 G PO SOLR
2000.0000 mL | Freq: Once | ORAL | Status: AC
  Administered 2021-07-20: 10:00:00 2000 mL via ORAL
  Filled 2021-07-19 (×2): qty 4000

## 2021-07-19 MED ORDER — LORAZEPAM 0.5 MG PO TABS: 0.5000 mg | ORAL_TABLET | Freq: Four times a day (QID) | ORAL | Status: DC | PRN

## 2021-07-19 MED ORDER — ONDANSETRON HCL 4 MG PO TABS: 4.0000 mg | ORAL_TABLET | Freq: Four times a day (QID) | ORAL | Status: DC | PRN

## 2021-07-19 NOTE — ED Provider Notes (Signed)
[facility] Emergency Department Provider Note  ____________________________________________   Event Date/Time   First MD Initiated Contact with Patient 07/19/21 1703     (approximate)  I have reviewed the triage vital signs and the nursing notes.   HISTORY  Chief Complaint Rectal Bleeding    HPI Carrie Roberson is a 75 y.o. female with past medical history as below here with abdominal pain and rectal bleeding.  The patient states that yesterday, she experienced acute onset of severe, diffuse, crampy, stabbing, abdominal pain.  She then felt nauseous and had a large, nonbloody bowel movement.  The pain largely resolved afterwards though she had some mild intermittent cramping today.  However, throughout the day today, she has had persistent bright red blood per rectum and passage of clots.  She has had no recurrence of her severe pain.  Patient reports she has a history of ischemic colitis in the past, felt somewhat similar.  No history of A. fib.  No cardiac history.  No other complaints.  No fevers or chills.    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   Genetic testing 11/02/2019   Weight loss, unintentional 10/16/2019   Chemotherapy induced diarrhea 10/13/2019   Orthostasis 10/13/2019   Hypokalemia 10/13/2019   Restless leg 10/13/2019   Bone pain due  to G-CSF 10/13/2019   Iron deficiency 10/02/2019   Anxiety associated with cancer diagnosis ([place]) 10/02/2019   Chemotherapy-induced nausea 10/02/2019   Thyroid nodule 10/02/2019   Hyponatremia 10/02/2019   B12 deficiency 09/18/2019   Family history of breast cancer    Family history of leukemia    Goals of care, counseling/discussion 09/07/2019   Ovarian mass 08/19/2019   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) 08/19/2019   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HCC)    Cyst of ovary 07/15/2019   Anxiety 09/02/2017   DDD (degenerative disc disease), 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   FH: diabetes mellitus 09/02/2017   Obesity, Class I, BMI 30.0-34.9 (see actual BMI) 09/02/2017   Atypical nevus 09/02/2017   IBS (irritable bowel syndrome) 09/02/2017   Atrophic vaginitis 09/02/2017   Chronic idiopathic urticaria 09/02/2017   GERD (gastroesophageal reflux disease) 09/02/2017    Past Surgical History:  Procedure Laterality Date   ABDOMINAL HYSTERECTOMY     BREAST BIOPSY Left    needle bx/clip-neg   CERVICAL BIOPSY  W/ LOOP ELECTRODE EXCISION     CHOLECYSTECTOMY     Colonoscopoy  2007, 2008, 2011   COLONOSCOPY WITH PROPOFOL N/A 05/05/2018   Procedure: COLONOSCOPY WITH PROPOFOL;  Surgeon: Lin Landsman, MD;  Location: [facility] ENDOSCOPY;  Service: Gastroenterology;  Laterality: N/A;   HYSTERECTOMY ABDOMINAL WITH SALPINGO-OOPHORECTOMY N/A 08/19/2019   Procedure: HYSTERECTOMY ABDOMINAL WITH SALPINGO-OOPHORECTOMY WITH PELVIC WASHINGS PERITONEAL BIOPSIES AND  PARA AORTIC LYMPH NODE DISSECTION;  Surgeon: [name], MD;  Location: [facility] ORS;  Service: Gynecology;  Laterality: N/A;   LEEP     OMENTECTOMY N/A 08/19/2019   Procedure: OMENTECTOMY;  Surgeon: [name], MD;  Location: [facility] ORS;  Service: Gynecology;  Laterality: N/A;   OOPHORECTOMY     PORTA CATH INSERTION N/A 09/11/2019   Procedure: PORTA CATH INSERTION;  Surgeon: Algernon Huxley, MD;   Location: [place] [facility];  Service: Cardiovascular;  Laterality: N/A;   TUBAL LIGATION  1999    Prior to Admission medications   Medication Sig Start Date End Date Taking? Authorizing Provider  acetaminophen (TYLENOL) 500 MG tablet Take 1,000 mg by mouth every 6 (six) hours as needed (for pain.).    [provider]  Cranberry 300 MG tablet Take 300 mg by mouth 2 (two) times daily.    [provider]  fluticasone (FLONASE) 50 MCG/ACT nasal spray Place 1 spray into both nostrils daily. otc    [provider]  lisinopril (ZESTRIL) 5 MG tablet Take 1 tablet (5 mg total) by mouth daily. 05/02/21   Juline Patch, MD  loratadine (CLARITIN) 10 MG tablet Take 10 mg by mouth daily. otc    [provider]  LORazepam (ATIVAN) 0.5 MG tablet TAKE 1 TABLET (0.5 MG TOTAL) BY MOUTH EVERY 6 (SIX) HOURS AS NEEDED (NAUSEA/VOMITING OR ANXIETY). 05/19/20   Verlon Au, NP  mirabegron ER (MYRBETRIQ) 50 MG TB24 tablet Take 1 tablet (50 mg total) by mouth daily. 07/04/21   Vaillancourt, Aldona Bar, PA-C  pregabalin (LYRICA) 75 MG capsule Take 1 capsule (75 mg total) by mouth 2 (two) times daily. 01/10/21   Sindy Guadeloupe, MD    Allergies Citalopram, Ivp dye [iodinated contrast media], Meloxicam, Adhesive [tape], Amlodipine, Ampicillin, Cefoxitin, Chlorhexidine, Estradiol, Estropipate, Olmesartan medoxomil-hctz, Sertraline, and Sulfa antibiotics  Family History  Problem Relation Age of Onset   Heart failure Mother    Atrial fibrillation Mother    Hypertension Mother    Diabetes Father    Heart attack Father    Breast cancer Maternal Aunt 26   Breast cancer Maternal Grandmother 29   Breast cancer Cousin        2 mat cousins   Hypertension Sister    Diabetes Sister    Blindness Sister    Hypertension Sister    Arthritis Sister    Leukemia Paternal Aunt    Leukemia Paternal Uncle    Brain cancer Paternal Aunt    Other Brother 48       Drowning accident   Liver  cancer Maternal Uncle    Alcohol abuse Maternal Uncle    Prostate cancer Neg Hx    Kidney cancer Neg Hx    BRCA 1/2 Neg Hx     Social History Social History   Tobacco Use   Smoking status: Never   Smokeless tobacco: Never   Tobacco comments:    2bd hand smoke from father  Vaping Use   Vaping Use: Never used  Substance Use Topics   Alcohol use: Yes    Comment: rarely   Drug use: No    Review of Systems  Review of Systems  Constitutional:  Positive for fatigue. Negative for fever.  HENT:  Negative for congestion and sore throat.   Eyes:  Negative for visual disturbance.  Respiratory:  Negative for cough and shortness of breath.   Cardiovascular:  Negative for chest pain.  Gastrointestinal:  Positive for abdominal  pain, blood in stool and nausea. Negative for diarrhea and vomiting.  Genitourinary:  Negative for flank pain.  Musculoskeletal:  Negative for back pain and neck pain.  Skin:  Negative for rash and wound.  Neurological:  Negative for weakness.  All other systems reviewed and are negative.   ____________________________________________  PHYSICAL EXAM:      VITAL SIGNS: ED Triage Vitals  Enc Vitals Group     BP 07/19/21 1221 (!) 147/71     Pulse Rate 07/19/21 1221 91     Resp 07/19/21 1221 17     Temp 07/19/21 1221 98.3 F (36.8 C)     Temp Source 07/19/21 1221 Oral     SpO2 07/19/21 1221 94 %     Weight 07/19/21 1223 180 lb (81.6 kg)     Height 07/19/21 1223 _0  (1.6 m)     Head Circumference --      Peak Flow --      Pain Score 07/19/21 1223 3     Pain Loc --      Pain Edu? --      Excl. in [place]? --      Physical Exam Vitals and nursing note reviewed.  Constitutional:      General: She is not in acute distress.    Appearance: She is well-developed.  HENT:     Head: Normocephalic and atraumatic.  Eyes:     Conjunctiva/sclera: Conjunctivae normal.  Cardiovascular:     Rate and Rhythm: Normal rate and regular rhythm.     Heart sounds:  Normal heart sounds. No murmur heard.   No friction rub.  Pulmonary:     Effort: Pulmonary effort is normal. No respiratory distress.     Breath sounds: Normal breath sounds. No wheezing or rales.  Abdominal:     General: There is no distension.     Palpations: Abdomen is soft.     Tenderness: There is no abdominal tenderness.  Musculoskeletal:     Cervical back: Neck supple.  Skin:    General: Skin is warm.     Capillary Refill: Capillary refill takes less than 2 seconds.  Neurological:     Mental Status: She is alert and oriented to person, place, and time.     Motor: No abnormal muscle tone.      ____________________________________________   LABS (all labs ordered are listed, but only abnormal results are displayed)  Labs Reviewed  COMPREHENSIVE METABOLIC PANEL - Abnormal; Notable for the following components:      Result Value   Glucose, Bld 123 (*)    All other components within normal limits  CBC - Abnormal; Notable for the following components:   WBC 12.3 (*)    Hemoglobin 15.1 (*)    All other components within normal limits  RESP PANEL BY RT-PCR (FLU A&B, COVID) ARPGX2  CULTURE, BLOOD (SINGLE)  C DIFFICILE QUICK SCREEN W PCR REFLEX    GASTROINTESTINAL PANEL BY PCR, STOOL (REPLACES STOOL CULTURE)  LACTIC ACID, PLASMA  LACTIC ACID, PLASMA  HEMOGLOBIN AND HEMATOCRIT, BLOOD  PHOSPHORUS  MAGNESIUM  TSH  BASIC METABOLIC PANEL  CBC  POC OCCULT BLOOD, ED  TYPE AND SCREEN    ____________________________________________  ZOX:WRUEAV sinus rhythm, VR 77. PR 164, QRS 74, QTc 411. No acute ST elevations or STEMI. ________________________________________  RADIOLOGY All imaging, including plain films, CT scans, and ultrasounds, independently reviewed by me, and interpretations confirmed via formal radiology reads.  ED MD interpretation:   CT abdomen/pelvis: Abnormal  appearance of the distal transverse colon, splenic flexure, and descending colon, query  infectious/inflammatory versus ischemic colitis  Official radiology report(s): CT Abdomen Pelvis Wo Contrast  Result Date: 07/19/2021 CLINICAL DATA:  RLQ abdominal pain (Age >= 14y) EXAM: CT ABDOMEN AND PELVIS WITHOUT CONTRAST TECHNIQUE: Multidetector CT imaging of the abdomen and pelvis was performed following the standard protocol without IV contrast. COMPARISON:  July 2021 FINDINGS: Lower chest: No acute abnormality. Hepatobiliary: No focal liver abnormality is seen. Status post cholecystectomy. No unexpected biliary dilatation. Pancreas: Unremarkable. Spleen: Unremarkable. Adrenals/Urinary Tract: Adrenals are unremarkable. Small right renal cyst. Bladder is poorly distended. Stomach/Bowel: Stomach is within normal limits. Bowel is normal in caliber. There is wall thickening with surrounding fat infiltration along the distal transverse colon, splenic flexure, and descending colon. Sigmoid diverticulosis. Vascular/Lymphatic: Aortic atherosclerosis.  No enlarged nodes. Reproductive: Status post hysterectomy. No adnexal masses. Other: No free fluid. Small fat containing ventral abdominal hernias. Musculoskeletal: Degenerative changes of the included spine. IMPRESSION: Abnormal appearance of distal transverse colon, splenic flexure, descending colon. May reflect infectious/inflammatory colitis or ischemic colitis. Sigmoid diverticulosis. Aortic atherosclerosis. Electronically Signed   By: Macy Mis M.D.   On: 07/19/2021 13:42    ____________________________________________  PROCEDURES   Procedure(s) performed (including Critical Care):  Procedures  ____________________________________________  INITIAL IMPRESSION / MDM / [place] / ED COURSE  As part of my medical decision making, I reviewed the following data within the [place] notes reviewed and incorporated, Old chart reviewed, Notes from prior ED visits, and [place] Controlled Substance Database        *Carrie Roberson was evaluated in Emergency Department on 07/20/2021 for the symptoms described in the history of present illness. She was evaluated in the context of the global COVID-19 pandemic, which necessitated consideration that the patient might be at risk for infection with the SARS-CoV-2 virus that causes COVID-19. Institutional protocols and algorithms that pertain to the evaluation of patients at risk for COVID-19 are in a state of rapid change based on information released by regulatory bodies including the CDC and federal and state organizations. These policies and algorithms were followed during the patient's care in the ED.  Some ED evaluations and interventions may be delayed as a result of limited staffing during the pandemic.*     Medical Decision Making: 75 year old female here with severe abdominal pain yesterday and now bloody bowel movements.  History of ischemic colitis in the past treated medically.  Clinically, suspect recurrent ischemic colitis.  She has no A. fib or cardiac history and normal lactic acid with no ongoing pain, do not suspect acute arterial or venous occlusion.  She is hemodynamically stable.  There is no evidence of porta hepatis or free air.  Discussed the case with GI Dr. Haig Prophet, who does not feel patient needs CT angio.  She also has a history of allergy to contrast so feel that risks of contrast outweigh benefits at this time given resolution of pain and normal lactic acid despite pain for over 24 hours.  Will plan to admit on clear liquids with likely scope in the morning.  Cipro Flagyl given empirically.  ____________________________________________  FINAL CLINICAL IMPRESSION(S) / ED DIAGNOSES  Final diagnoses:  Ischemic colitis ([place])     MEDICATIONS GIVEN DURING THIS VISIT:  Medications  ciprofloxacin (CIPRO) IVPB 400 mg (0 mg Intravenous Stopped 07/19/21 1908)  metroNIDAZOLE (FLAGYL) IVPB 500 mg (0 mg Intravenous Stopped 07/19/21 1904)   LORazepam (ATIVAN) tablet 0.5 mg (has no  administration in time range)  acetaminophen (TYLENOL) tablet 1,000 mg (has no administration in time range)    Or  acetaminophen (TYLENOL) suppository 650 mg (has no administration in time range)  ondansetron (ZOFRAN) tablet 4 mg (has no administration in time range)    Or  ondansetron (ZOFRAN) injection 4 mg (has no administration in time range)  polyethylene glycol-electrolytes (NuLYTELY) solution 2,000 mL (0 mLs Oral Hold 07/19/21 2200)  0.9 %  sodium chloride infusion ( Intravenous Restarted 07/19/21 1917)     ED Discharge Orders     None        Note:  This document was prepared using Dragon voice recognition software and may include unintentional dictation errors.   Duffy Bruce, MD 07/20/21 [phone]

## 2021-07-19 NOTE — Telephone Encounter (Signed)
Called patient to let her know per  drvanga try a lighter diet today to see if symptoms improve if they get worse to call us back patient states she is frightened and will go to emergency room to be checked

## 2021-07-19 NOTE — Telephone Encounter (Signed)
Inbound call from pt requesting a call back stating that she had stomach cramping yesterday and today she went to the bathroom and she has bright red blood in her stool. Please advise. Thank you.

## 2021-07-19 NOTE — ED Notes (Signed)
Pt placed on cardiac monitor. Family at bedside. Call light within reach. Pt has no request at this time.

## 2021-07-19 NOTE — ED Provider Notes (Signed)
°  Emergency Medicine Provider Triage Evaluation Note  Carrie Roberson , a 75 y.o.female,  was evaluated in triage.  Pt complains of abdominal pain and rectal bleeding.  Patient states this been going on for the past 48 hours.  Multiple episodes of bloody diarrhea.  Additionally endorses right lower quadrant pain.  Endorses a history of ischemic colitis.  Denies fever/chills, urinary symptoms, back pain, or abdominal pain.  Patient has a contrast allergy   Review of Systems  Positive: Abdominal pain, rectal bleeding Negative: Denies fever, chest pain, vomiting  Physical Exam   Vitals:   07/19/21 1221  BP: (!) 147/71  Pulse: 91  Resp: 17  Temp: 98.3 F (36.8 C)  SpO2: 94%   Gen:   Awake, no distress   Resp:  Normal effort  MSK:   Moves extremities without difficulty  Other:    Medical Decision Making  Given the patient's initial medical screening exam, the following diagnostic evaluation has been ordered. The patient will be placed in the appropriate treatment space, once one is available, to complete the evaluation and treatment. I have discussed the plan of care with the patient and I have advised the patient that an ED physician or mid-level practitioner will reevaluate their condition after the test results have been received, as the results may give them additional insight into the type of treatment they may need.    Diagnostics: Labs, type and screen, CT abdomen pelvis without contrast  Treatments: none immediately   Teodoro Spray, PA 07/19/21 [place], MD 07/19/21 [phone]

## 2021-07-19 NOTE — H&P (Signed)
History and Physical   [place] YEL:[id number] DOB: 28-Jul-1945 DOA: 07/19/2021  PCP: Juline Patch, MD  Outpatient Specialists: Dr. Janese Banks, hematology/oncology Patient coming from: home  I have personally briefly reviewed patient's old medical records in [place].  Chief Concern: Abdominal pain  HPI: Carrie Roberson is a 75 y.o. female with medical history significant for anxiety, diverticulitis, GERD, history of ischemic colitis, hypertension, IBS, ovarian cancer, status postchemotherapy in 2021, 6 treatments, vaginal atrophy, who presents emergency department for chief concerns of abdominal pain.  At bedside patient is able to tell me her full name, her age, and her location of hospital.  She provided the full H&P.  She reports the abdominal pain, described as cramping, developed 12/27. She had a large bowel movement and it was watery and brown. She denies recent antibiotic use. This AM, she had granular particles mixed with bright red blood. She reports she had about 4-5 bloody bowel movements today.  She reports the last 2-3 bowel movements were all blood. She reports this is similar to prior episode of ischemic colitis more than 13 years ago.   She endorses nausea and vomiting one time yesterday, 12/27.  She reports her last colonoscopy was 3 years ago.  She was due for a colonoscopy about 1 to 2 months ago, and she was scheduled for it.  She took the prep and developed intractable nausea and vomiting and was not able to complete full prep.  Her colonoscopy was canceled.  Social history: She lives with her sister at home. She denies history of smoking. She infrequently drinks etoh, she drinks about 1 glasses of wine once or twice per day. She formerly worked as a Chartered certified accountant.   Vaccination history: She has had three covid 19 vaccines.   ROS: Constitutional: no weight change, no fever ENT/Mouth: no sore throat, no rhinorrhea Eyes: no eye pain, no vision changes Cardiovascular: no  chest pain, no dyspnea,  no edema, no palpitations Respiratory: no cough, no sputum, no wheezing Gastrointestinal: + nausea, + vomiting, + diarrhea, no constipation Genitourinary: no urinary incontinence, no dysuria, no hematuria Musculoskeletal: no arthralgias, no myalgias Skin: no skin lesions, no pruritus, Neuro: + weakness, no loss of consciousness, no syncope Psych: no anxiety, no depression, + decrease appetite Heme/Lymph: no bruising, no bleeding  ED Course: Discussed with emergency medicine provider, patient requiring hospitalization for chief concerns of ischemic colitis.  Vitals in the emergency department showed temperature of 98.3, respiration rate of 17, heart rate of 91, blood pressure 147/71, SPO2 of 94% on room air.  Labs in the emergency department showed serum sodium 136, potassium 4, chloride 106, bicarb 24, BUN of 11, serum creatinine of 0.70, nonfasting blood glucose 123.  GFR greater than 60.  WBC was elevated at 12.3, hemoglobin 15.1, platelets of 235.  CT abdomen and pelvis without contrast was ordered by EDP and was read as abnormal appearance of the distal transverse colon, splenic flexure, descending colon.  May reflect infectious/inflammatory colitis or ischemic colitis.  Sigmoid diverticulosis.  Aortic atherosclerosis.  In the emergency department EDP ordered Cipro floxacillin and Flagyl IV.  Per ED provider who called Dr. Haig Prophet, who recommended clear liquids and states the patient will have colonoscopy in the a.m.  Assessment/Plan  Principal Problem:   Colitis Active Problems:   Anxiety   DDD (degenerative disc disease), thoracic   Obesity, Class I, BMI 30.0-34.9 (see actual BMI)   IBS (irritable bowel syndrome)   GERD (gastroesophageal reflux disease)  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HCC)   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)   Anxiety associated with cancer diagnosis ([place])    Chemotherapy-induced peripheral neuropathy ([place])   # Colitis-etiology work-up in progress # Leukocytosis-etiology work-up - C. difficile and GI panel ordered - Clear liquids, n.p.o. after midnight - GI has been consulted states that patient will get a colonoscopy tomorrow morning  # History of hypertension-takes lisinopril 5 mg daily at home  # History of anxiety-patient takes Ativan 0.5 mg p.o. every 6 hours as needed for nausea/vomiting/anxiety  # History of ovarian cancer-outpatient follow-up with obgyn  Chart reviewed.   DVT prophylaxis: ted hose Code Status: full code Diet: clear liquids now, npo after midnight except for sips with meds and ice chips Family Communication: no Disposition Plan: pending clinical course Consults called: none at this time Admission status: Telemetry medical, observation  Past Medical History:  Diagnosis Date   Anxiety    Arthritis    Diverticulitis    Family history of breast cancer    Family history of leukemia    GERD (gastroesophageal reflux disease)    History of ischemic colitis 09/02/2017   Hx of colonic polyp    Hypertension    H/O-PT TAKEN OFF BY PCP AS OF 2019   IBS (irritable bowel syndrome)    Ovarian cancer ([place])    Ovarian cyst    Personal history of chemotherapy 2021   6 treatments   Vaginal atrophy    Past Surgical History:  Procedure Laterality Date   ABDOMINAL HYSTERECTOMY     BREAST BIOPSY Left    needle bx/clip-neg   CERVICAL BIOPSY  W/ LOOP ELECTRODE EXCISION     CHOLECYSTECTOMY     Colonoscopoy  2007, 2008, 2011   COLONOSCOPY WITH PROPOFOL N/A 05/05/2018   Procedure: COLONOSCOPY WITH PROPOFOL;  Surgeon: Lin Landsman, MD;  Location: [facility];  Service: Gastroenterology;  Laterality: N/A;   HYSTERECTOMY ABDOMINAL WITH SALPINGO-OOPHORECTOMY N/A 08/19/2019   Procedure: HYSTERECTOMY ABDOMINAL WITH SALPINGO-OOPHORECTOMY WITH PELVIC WASHINGS PERITONEAL BIOPSIES AND PARA AORTIC LYMPH NODE DISSECTION;   Surgeon: [name], MD;  Location: [facility];  Service: Gynecology;  Laterality: N/A;   LEEP     OMENTECTOMY N/A 08/19/2019   Procedure: OMENTECTOMY;  Surgeon: [name], MD;  Location: [facility];  Service: Gynecology;  Laterality: N/A;   OOPHORECTOMY     PORTA CATH INSERTION N/A 09/11/2019   Procedure: PORTA CATH INSERTION;  Surgeon: Algernon Huxley, MD;  Location: [facility];  Service: Cardiovascular;  Laterality: N/A;   TUBAL LIGATION  1999   Social History:  reports that she has never smoked. She has never used smokeless tobacco. She reports current alcohol use. She reports that she does not use drugs.  Allergies  Allergen Reactions   Citalopram Nausea And Vomiting   Ivp Dye [Iodinated Contrast Media] Hives and Swelling    hives   Meloxicam     unknown   Adhesive [Tape] Rash   Amlodipine Rash   Ampicillin Rash    Did it involve swelling of the face/tongue/throat, SOB, or low BP? No Did it involve sudden or severe rash/hives, skin peeling, or any reaction on the inside of your mouth or nose? Yes Did you need to seek medical attention at a hospital or doctor's office? Yes When did it last happen?       10 + years If all above answers are NO, may proceed with cephalosporin use.   Cefoxitin  Rash   Chlorhexidine Rash    Chloraprep Scrub Teal   Estradiol Rash   Estropipate Rash   Olmesartan Medoxomil-Hctz Rash   Sertraline Rash        Sulfa Antibiotics Rash   Family History  Problem Relation Age of Onset   Heart failure Mother    Atrial fibrillation Mother    Hypertension Mother    Diabetes Father    Heart attack Father    Breast cancer Maternal Aunt 40   Breast cancer Maternal Grandmother 105   Breast cancer Cousin        2 mat cousins   Hypertension Sister    Diabetes Sister    Blindness Sister    Hypertension Sister    Arthritis Sister    Leukemia Paternal Aunt    Leukemia Paternal Uncle    Brain cancer Paternal Aunt    Other Brother 81        Drowning accident   Liver cancer Maternal Uncle    Alcohol abuse Maternal Uncle    Prostate cancer Neg Hx    Kidney cancer Neg Hx    BRCA 1/2 Neg Hx    Family history: Family history reviewed and not pertinent  Prior to Admission medications   Medication Sig Start Date End Date Taking? Authorizing Provider  acetaminophen (TYLENOL) 500 MG tablet Take 1,000 mg by mouth every 6 (six) hours as needed (for pain.).    [provider]  Cranberry 300 MG tablet Take 300 mg by mouth 2 (two) times daily.    [provider]  fluticasone (FLONASE) 50 MCG/ACT nasal spray Place 1 spray into both nostrils daily. otc    [provider]  lisinopril (ZESTRIL) 5 MG tablet Take 1 tablet (5 mg total) by mouth daily. 05/02/21   Juline Patch, MD  loratadine (CLARITIN) 10 MG tablet Take 10 mg by mouth daily. otc    [provider]  LORazepam (ATIVAN) 0.5 MG tablet TAKE 1 TABLET (0.5 MG TOTAL) BY MOUTH EVERY 6 (SIX) HOURS AS NEEDED (NAUSEA/VOMITING OR ANXIETY). 05/19/20   Verlon Au, NP  mirabegron ER (MYRBETRIQ) 50 MG TB24 tablet Take 1 tablet (50 mg total) by mouth daily. 07/04/21   Vaillancourt, Aldona Bar, PA-C  pregabalin (LYRICA) 75 MG capsule Take 1 capsule (75 mg total) by mouth 2 (two) times daily. 01/10/21   Sindy Guadeloupe, MD   Physical Exam: Vitals:   07/19/21 1700 07/19/21 1715 07/19/21 1800 07/19/21 1830  BP: (!) 159/77  136/61 (!) 141/70  Pulse: 82 79 84 76  Resp: (!) 27 17 14 18   Temp:      TempSrc:      SpO2: 99% 99% 95% 96%  Weight:      Height:       Constitutional: appears age-appropriate, NAD, calm, comfortable Eyes: PERRL, lids and conjunctivae normal ENMT: Mucous membranes are moist. Posterior pharynx clear of any exudate or lesions. Age-appropriate dentition. Hearing appropriate/loss Neck: normal, supple, no masses, no thyromegaly Respiratory: clear to auscultation bilaterally, no wheezing, no crackles. Normal respiratory effort. No  accessory muscle use.  Cardiovascular: Regular rate and rhythm, no murmurs / rubs / gallops. No extremity edema. 2+ pedal pulses. No carotid bruits.  Abdomen: Obese abdomen, no tenderness, no masses palpated, no hepatosplenomegaly. Bowel sounds positive.  Musculoskeletal: no clubbing / cyanosis. No joint deformity upper and lower extremities. Good ROM, no contractures, no atrophy. Normal muscle tone.  Skin: no rashes, lesions, ulcers. No induration Neurologic: Sensation intact. Strength 5/5  in all 4.  Psychiatric: Normal judgment and insight. Alert and oriented x 3. Normal mood.   EKG: independently reviewed, showing atrial flutter, AV block, with rate of 78, QTc 399  Chest x-ray on Admission: I personally reviewed and I agree with radiologist reading as below.  CT Abdomen Pelvis Wo Contrast  Result Date: 07/19/2021 CLINICAL DATA:  RLQ abdominal pain (Age >= 14y) EXAM: CT ABDOMEN AND PELVIS WITHOUT CONTRAST TECHNIQUE: Multidetector CT imaging of the abdomen and pelvis was performed following the standard protocol without IV contrast. COMPARISON:  July 2021 FINDINGS: Lower chest: No acute abnormality. Hepatobiliary: No focal liver abnormality is seen. Status post cholecystectomy. No unexpected biliary dilatation. Pancreas: Unremarkable. Spleen: Unremarkable. Adrenals/Urinary Tract: Adrenals are unremarkable. Small right renal cyst. Bladder is poorly distended. Stomach/Bowel: Stomach is within normal limits. Bowel is normal in caliber. There is wall thickening with surrounding fat infiltration along the distal transverse colon, splenic flexure, and descending colon. Sigmoid diverticulosis. Vascular/Lymphatic: Aortic atherosclerosis.  No enlarged nodes. Reproductive: Status post hysterectomy. No adnexal masses. Other: No free fluid. Small fat containing ventral abdominal hernias. Musculoskeletal: Degenerative changes of the included spine. IMPRESSION: Abnormal appearance of distal transverse colon,  splenic flexure, descending colon. May reflect infectious/inflammatory colitis or ischemic colitis. Sigmoid diverticulosis. Aortic atherosclerosis. Electronically Signed   By: Macy Mis M.D.   On: 07/19/2021 13:42    Labs on Admission: I have personally reviewed following labs  CBC: Recent Labs  Lab 07/19/21 1225 07/19/21 1717  WBC 12.3*  --   HGB 15.1* 14.9  HCT 45.2 45.0  MCV 91.9  --   PLT 235  --    Basic Metabolic Panel: Recent Labs  Lab 07/19/21 1225  NA 136  K 4.0  CL 106  CO2 24  GLUCOSE 123*  BUN 11  CREATININE 0.70  CALCIUM 10.1   GFR: Estimated Creatinine Clearance: 59.6 mL/min (by C-G formula based on SCr of 0.7 mg/dL).  Liver Function Tests: Recent Labs  Lab 07/19/21 1225  AST 25  ALT 28  ALKPHOS 104  BILITOT 0.8  PROT 6.9  ALBUMIN 3.9   Urine analysis:    Component Value Date/Time   COLORURINE AMBER (A) 05/19/2021 1425   APPEARANCEUR HAZY (A) 05/19/2021 1425   APPEARANCEUR Clear 01/11/2021 0910   LABSPEC 1.012 05/19/2021 1425   PHURINE 5.0 05/19/2021 1425   GLUCOSEU NEGATIVE 05/19/2021 1425   HGBUR MODERATE (A) 05/19/2021 1425   BILIRUBINUR NEGATIVE 05/19/2021 1425   BILIRUBINUR Negative 01/11/2021 0910   KETONESUR NEGATIVE 05/19/2021 1425   PROTEINUR 30 (A) 05/19/2021 1425   UROBILINOGEN 0.2 12/21/2020 1405   NITRITE POSITIVE (A) 05/19/2021 1425   LEUKOCYTESUR LARGE (A) 05/19/2021 1425   Dr. Tobie Poet [facility]  If 7PM-7AM, please contact overnight-coverage provider If 7AM-7PM, please contact day coverage provider [email or web address]  07/19/2021, 7:45 PM

## 2021-07-19 NOTE — ED Notes (Signed)
Pt given warm blankets.

## 2021-07-19 NOTE — ED Notes (Signed)
Provider at bedside

## 2021-07-19 NOTE — Telephone Encounter (Signed)
Patient had history of acute sigmoid diverticulitis.  Please advise patient to stay on lighter diet today and see if her symptoms improve or resolve in the next 24 hours.  Please have her call us back if her symptoms are worsening RV

## 2021-07-19 NOTE — ED Triage Notes (Signed)
Pt here with rectal bleeding that started this morning nut states her abd pain started yesterday. Pt states she had a large BM that relieved her but then she started to bleed. Pt in NAD in triage.

## 2021-07-19 NOTE — Consult Note (Signed)
Consultation  Referring Provider:     ED Admit date: 12/28 Consult date: 12/28         Reason for Consultation:     Hematochezia/abnormal imaging         HPI:   Carrie Roberson is a 75 y.o. 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 She pain free now. CT imaging shows colitis around distal transverse to descending colon. Patient does not take blood thinners. Has not had any solids today. Had relatively recent colonoscopy but is due for another one which was scheduled but not completed due to inability to tolerate prep. She has cut back her fluid intake due to overactive bladder. Occasional NSAID use with last dose a few days ago. History of hysterectomy and cholecystectomy. Has developed new GERD symptoms.   Past Medical History:  Diagnosis Date   Anxiety    Arthritis    Diverticulitis    Family history of breast cancer    Family history of leukemia    GERD (gastroesophageal reflux disease)    History of ischemic colitis 09/02/2017   Hx of colonic polyp    Hypertension    H/O-PT TAKEN OFF BY PCP AS OF 2019   IBS (irritable bowel syndrome)    Ovarian cancer ([place])    Ovarian cyst    Personal history of chemotherapy 2021   6 treatments   Vaginal atrophy     Past Surgical History:  Procedure Laterality Date   ABDOMINAL HYSTERECTOMY     BREAST BIOPSY Left    needle bx/clip-neg   CERVICAL BIOPSY  W/ LOOP ELECTRODE EXCISION     CHOLECYSTECTOMY     Colonoscopoy  2007, 2008, 2011   COLONOSCOPY WITH PROPOFOL N/A 05/05/2018   Procedure: COLONOSCOPY WITH PROPOFOL;  Surgeon: Lin Landsman, MD;  Location: [facility];  Service: Gastroenterology;  Laterality: N/A;   HYSTERECTOMY ABDOMINAL WITH SALPINGO-OOPHORECTOMY N/A 08/19/2019   Procedure: HYSTERECTOMY ABDOMINAL WITH SALPINGO-OOPHORECTOMY WITH PELVIC WASHINGS PERITONEAL BIOPSIES AND PARA AORTIC LYMPH NODE  DISSECTION;  Surgeon: [name], MD;  Location: [facility] OR[facility];  Service: Gynecology;  Laterality: N/A;   LEEP     OMENTECTOMY N/A 08/19/2019   Procedure: OMENTECTOMY;  Surgeon: [name], MD;  Location: [facility] ORS;  Service: Gynecology;  Laterality: N/A;   OOPHORECTOMY     PORTA CATH INSERTION N/A 09/11/2019   Procedure: PORTA CATH INSERTION;  Surgeon: Algernon Huxley, MD;  Location: [place] [facility];  Service: Cardiovascular;  Laterality: N/A;   TUBAL LIGATION  1999    Family History  Problem Relation Age of Onset   Heart failure Mother    Atrial fibrillation Mother    Hypertension Mother    Diabetes Father    Heart attack Father    Breast cancer Maternal Aunt 75   Breast cancer Maternal Grandmother 53   Breast cancer Cousin        2 mat cousins   Hypertension Sister    Diabetes Sister    Blindness Sister    Hypertension Sister    Arthritis Sister    Leukemia Paternal Aunt    Leukemia Paternal Uncle    Brain cancer Paternal Aunt    Other Brother 65       Drowning accident   Liver cancer Maternal Uncle    Alcohol abuse Maternal Uncle    Prostate cancer Neg Hx    Kidney cancer Neg  Hx    BRCA 1/2 Neg Hx      Social History   Tobacco Use   Smoking status: Never   Smokeless tobacco: Never   Tobacco comments:    2bd hand smoke from father  Vaping Use   Vaping Use: Never used  Substance Use Topics   Alcohol use: Yes    Comment: rarely   Drug use: No    Prior to Admission medications   Medication Sig Start Date End Date Taking? Authorizing Provider  acetaminophen (TYLENOL) 500 MG tablet Take 1,000 mg by mouth every 6 (six) hours as needed (for pain.).    [provider]  Cranberry 300 MG tablet Take 300 mg by mouth 2 (two) times daily.    [provider]  fluticasone (FLONASE) 50 MCG/ACT nasal spray Place 1 spray into both nostrils daily. otc    [provider]  lisinopril (ZESTRIL) 5 MG tablet Take 1 tablet (5 mg total) by mouth  daily. 05/02/21   Juline Patch, MD  loratadine (CLARITIN) 10 MG tablet Take 10 mg by mouth daily. otc    [provider]  LORazepam (ATIVAN) 0.5 MG tablet TAKE 1 TABLET (0.5 MG TOTAL) BY MOUTH EVERY 6 (SIX) HOURS AS NEEDED (NAUSEA/VOMITING OR ANXIETY). 05/19/20   Verlon Au, NP  mirabegron ER (MYRBETRIQ) 50 MG TB24 tablet Take 1 tablet (50 mg total) by mouth daily. 07/04/21   Vaillancourt, Aldona Bar, PA-C  pregabalin (LYRICA) 75 MG capsule Take 1 capsule (75 mg total) by mouth 2 (two) times daily. 01/10/21   Sindy Guadeloupe, MD    Current Facility-Administered Medications  Medication Dose Route Frequency Provider Last Rate Last Admin   acetaminophen (TYLENOL) tablet 1,000 mg  1,000 mg Oral Q6H PRN Cox, Amy N, DO       Or   acetaminophen (TYLENOL) suppository 650 mg  650 mg Rectal Q6H PRN Cox, Amy N, DO       ciprofloxacin (CIPRO) IVPB 400 mg  400 mg Intravenous Q12H Cox, Amy N, DO   Stopped at 07/19/21 1908   LORazepam (ATIVAN) tablet 0.5 mg  0.5 mg Oral Q6H PRN Cox, Amy N, DO       metroNIDAZOLE (FLAGYL) IVPB 500 mg  500 mg Intravenous Q8H Cox, Amy N, DO   Stopped at 07/19/21 1904   ondansetron (ZOFRAN) tablet 4 mg  4 mg Oral Q6H PRN Cox, Amy N, DO       Or   ondansetron (ZOFRAN) injection 4 mg  4 mg Intravenous Q6H PRN Cox, Amy N, DO       polyethylene glycol-electrolytes (NuLYTELY) solution 2,000 mL  2,000 mL Oral Once Lesly Rubenstein, MD       Current Outpatient Medications  Medication Sig Dispense Refill   acetaminophen (TYLENOL) 500 MG tablet Take 1,000 mg by mouth every 6 (six) hours as needed (for pain.).     Cranberry 300 MG tablet Take 300 mg by mouth 2 (two) times daily.     fluticasone (FLONASE) 50 MCG/ACT nasal spray Place 1 spray into both nostrils daily. otc     lisinopril (ZESTRIL) 5 MG tablet Take 1 tablet (5 mg total) by mouth daily. 90 tablet 1   loratadine (CLARITIN) 10 MG tablet Take 10 mg by mouth daily. otc     LORazepam (ATIVAN) 0.5 MG tablet TAKE  1 TABLET (0.5 MG TOTAL) BY MOUTH EVERY 6 (SIX) HOURS AS NEEDED (NAUSEA/VOMITING OR ANXIETY). 90 tablet 0   mirabegron ER (MYRBETRIQ)  50 MG TB24 tablet Take 1 tablet (50 mg total) by mouth daily. 30 tablet 11   pregabalin (LYRICA) 75 MG capsule Take 1 capsule (75 mg total) by mouth 2 (two) times daily. 60 capsule 3   Facility-Administered Medications Ordered in Other Encounters  Medication Dose Route Frequency Provider Last Rate Last Admin   sodium chloride flush (NS) 0.9 % injection 10 mL  10 mL Intravenous PRN Sindy Guadeloupe, MD   10 mL at 01/05/20 0750    Allergies as of 07/19/2021 - Review Complete 07/19/2021  Allergen Reaction Noted   Citalopram Nausea And Vomiting 09/30/2017   Ivp dye [iodinated contrast media] Hives and Swelling 06/11/2017   Meloxicam  06/11/2017   Adhesive [tape] Rash 08/12/2019   Amlodipine Rash 06/11/2017   Ampicillin Rash 06/11/2017   Cefoxitin Rash 06/11/2017   Chlorhexidine Rash 06/28/2021   Estradiol Rash 06/11/2017   Estropipate Rash 06/11/2017   Olmesartan medoxomil-hctz Rash 06/11/2017   Sertraline Rash 06/11/2017   Sulfa antibiotics Rash 06/11/2017     Review of Systems:    All systems reviewed and negative except where noted in HPI.  Review of Systems  Constitutional:  Negative for chills and fever.  Respiratory:  Negative for shortness of breath.   Cardiovascular:  Negative for chest pain.  Gastrointestinal:  Positive for abdominal pain, blood in stool, constipation and diarrhea.  Genitourinary:  Positive for frequency.  Skin:  Positive for rash.  Neurological:  Negative for focal weakness.  Psychiatric/Behavioral:  Negative for substance abuse.       Physical Exam:  Vital signs in last 24 hours: Temp:  [98.3 F (36.8 C)] 98.3 F (36.8 C) (12/28 1221) Pulse Rate:  [76-91] 76 (12/28 1830) Resp:  [14-27] 18 (12/28 1830) BP: (136-159)/(61-77) 141/70 (12/28 1830) SpO2:  [94 %-100 %] 96 % (12/28 1830) Weight:  [81.6 kg] 81.6 kg (12/28  1223)   General:   Pleasant in NAD Head:  Normocephalic and atraumatic. Eyes:   No icterus.   Conjunctiva pink. Mouth: Mucosa pink moist, no lesions. Neck:  Supple; no masses felt Lungs:  No respiratory distress Heart:  RRR Abdomen:   Flat, soft, nondistended, nontender. No appreciable masses or hepatomegaly. No rebound signs or other peritoneal signs. Msk:  Strength 5/5. Symmetrical without gross deformities. Neurologic:  Alert and  oriented x4;  Cranial nerves II-XII intact.  Skin:  Warm, dry, pink without significant lesions or rashes. Psych:  Alert and cooperative. Normal affect.  LAB RESULTS: Recent Labs    07/19/21 1225 07/19/21 1717  WBC 12.3*  --   HGB 15.1* 14.9  HCT 45.2 45.0  PLT 235  --    BMET Recent Labs    07/19/21 1225  NA 136  K 4.0  CL 106  CO2 24  GLUCOSE 123*  BUN 11  CREATININE 0.70  CALCIUM 10.1   LFT Recent Labs    07/19/21 1225  PROT 6.9  ALBUMIN 3.9  AST 25  ALT 28  ALKPHOS 104  BILITOT 0.8   PT/INR No results for input(s): LABPROT, INR in the last 72 hours.  STUDIES: CT Abdomen Pelvis Wo Contrast  Result Date: 07/19/2021 CLINICAL DATA:  RLQ abdominal pain (Age >= 14y) EXAM: CT ABDOMEN AND PELVIS WITHOUT CONTRAST TECHNIQUE: Multidetector CT imaging of the abdomen and pelvis was performed following the standard protocol without IV contrast. COMPARISON:  July 2021 FINDINGS: Lower chest: No acute abnormality. Hepatobiliary: No focal liver abnormality is seen. Status post cholecystectomy. No unexpected biliary dilatation.  Pancreas: Unremarkable. Spleen: Unremarkable. Adrenals/Urinary Tract: Adrenals are unremarkable. Small right renal cyst. Bladder is poorly distended. Stomach/Bowel: Stomach is within normal limits. Bowel is normal in caliber. There is wall thickening with surrounding fat infiltration along the distal transverse colon, splenic flexure, and descending colon. Sigmoid diverticulosis. Vascular/Lymphatic: Aortic atherosclerosis.   No enlarged nodes. Reproductive: Status post hysterectomy. No adnexal masses. Other: No free fluid. Small fat containing ventral abdominal hernias. Musculoskeletal: Degenerative changes of the included spine. IMPRESSION: Abnormal appearance of distal transverse colon, splenic flexure, descending colon. May reflect infectious/inflammatory colitis or ischemic colitis. Sigmoid diverticulosis. Aortic atherosclerosis. Electronically Signed   By: Macy Mis M.D.   On: 07/19/2021 13:42       Impression / Plan:   75 y/o lady with history of ischemic colitis who presents with hematochezia and CT findings concerning for recurrent ischemic colitis likely secondary to dehydration from trying to avoid fluids from overactive bladder  - clear liquids - NPO at midnight except for prep - needs to only try to drink 2 L of prep but needs to stop at 10 am tomorrow morning - will plan for flex sig tomorrow - IV fluids - daily labs - further recs after procedure tomorrow  Please call with any questions or concerns.  Raylene Miyamoto MD, MPH [place]

## 2021-07-20 ENCOUNTER — Encounter
Admission: EM | Disposition: A | Discharge status: Home / Self Care | Payer: Self-pay | Source: Home / Self Care | Attending: Internal Medicine

## 2021-07-20 ENCOUNTER — Encounter: Payer: Self-pay | Admitting: Gastroenterology

## 2021-07-20 ENCOUNTER — Observation Stay: Payer: Medicare Other | Admitting: Registered Nurse

## 2021-07-20 DIAGNOSIS — K558 Other vascular disorders of intestine: Secondary | ICD-10-CM | POA: Diagnosis not present

## 2021-07-20 DIAGNOSIS — G620 Drug-induced polyneuropathy: Secondary | ICD-10-CM | POA: Diagnosis present

## 2021-07-20 DIAGNOSIS — T451X5A Adverse effect of antineoplastic and immunosuppressive drugs, initial encounter: Secondary | ICD-10-CM | POA: Diagnosis present

## 2021-07-20 DIAGNOSIS — D72828 Other elevated white blood cell count: Secondary | ICD-10-CM | POA: Diagnosis not present

## 2021-07-20 DIAGNOSIS — Z1211 Encounter for screening for malignant neoplasm of colon: Secondary | ICD-10-CM | POA: Diagnosis not present

## 2021-07-20 DIAGNOSIS — Z20822 Contact with and (suspected) exposure to covid-19: Secondary | ICD-10-CM | POA: Diagnosis present

## 2021-07-20 DIAGNOSIS — Z90722 Acquired absence of ovaries, bilateral: Secondary | ICD-10-CM | POA: Diagnosis not present

## 2021-07-20 DIAGNOSIS — Z90710 Acquired absence of both cervix and uterus: Secondary | ICD-10-CM | POA: Diagnosis not present

## 2021-07-20 DIAGNOSIS — Z8543 Personal history of malignant neoplasm of ovary: Secondary | ICD-10-CM | POA: Diagnosis not present

## 2021-07-20 DIAGNOSIS — F419 Anxiety disorder, unspecified: Secondary | ICD-10-CM

## 2021-07-20 DIAGNOSIS — Z808 Family history of malignant neoplasm of other organs or systems: Secondary | ICD-10-CM | POA: Diagnosis not present

## 2021-07-20 DIAGNOSIS — E669 Obesity, unspecified: Secondary | ICD-10-CM | POA: Diagnosis present

## 2021-07-20 DIAGNOSIS — Z6831 Body mass index (BMI) 31.0-31.9, adult: Secondary | ICD-10-CM | POA: Diagnosis not present

## 2021-07-20 DIAGNOSIS — K529 Noninfective gastroenteritis and colitis, unspecified: Secondary | ICD-10-CM | POA: Diagnosis present

## 2021-07-20 DIAGNOSIS — Z9221 Personal history of antineoplastic chemotherapy: Secondary | ICD-10-CM | POA: Diagnosis not present

## 2021-07-20 DIAGNOSIS — K5730 Diverticulosis of large intestine without perforation or abscess without bleeding: Secondary | ICD-10-CM | POA: Diagnosis present

## 2021-07-20 DIAGNOSIS — Z8719 Personal history of other diseases of the digestive system: Secondary | ICD-10-CM | POA: Diagnosis not present

## 2021-07-20 DIAGNOSIS — Z86010 Personal history of colonic polyps: Secondary | ICD-10-CM | POA: Diagnosis not present

## 2021-07-20 DIAGNOSIS — N3281 Overactive bladder: Secondary | ICD-10-CM | POA: Diagnosis present

## 2021-07-20 DIAGNOSIS — I10 Essential (primary) hypertension: Secondary | ICD-10-CM | POA: Diagnosis present

## 2021-07-20 DIAGNOSIS — Z800 Family history of malignant neoplasm of digestive organs: Secondary | ICD-10-CM | POA: Diagnosis not present

## 2021-07-20 DIAGNOSIS — K589 Irritable bowel syndrome without diarrhea: Secondary | ICD-10-CM | POA: Diagnosis present

## 2021-07-20 DIAGNOSIS — K559 Vascular disorder of intestine, unspecified: Secondary | ICD-10-CM | POA: Diagnosis present

## 2021-07-20 DIAGNOSIS — Z79899 Other long term (current) drug therapy: Secondary | ICD-10-CM | POA: Diagnosis not present

## 2021-07-20 DIAGNOSIS — Z806 Family history of leukemia: Secondary | ICD-10-CM | POA: Diagnosis not present

## 2021-07-20 DIAGNOSIS — M5134 Other intervertebral disc degeneration, thoracic region: Secondary | ICD-10-CM | POA: Diagnosis present

## 2021-07-20 DIAGNOSIS — Z803 Family history of malignant neoplasm of breast: Secondary | ICD-10-CM | POA: Diagnosis not present

## 2021-07-20 DIAGNOSIS — C561 Malignant neoplasm of right ovary: Secondary | ICD-10-CM | POA: Diagnosis not present

## 2021-07-20 DIAGNOSIS — Z9049 Acquired absence of other specified parts of digestive tract: Secondary | ICD-10-CM | POA: Diagnosis not present

## 2021-07-20 DIAGNOSIS — K219 Gastro-esophageal reflux disease without esophagitis: Secondary | ICD-10-CM | POA: Diagnosis present

## 2021-07-20 HISTORY — PX: FLEXIBLE SIGMOIDOSCOPY: SHX5431

## 2021-07-20 LAB — CBC
HCT: 40.7 % (ref 36.0–46.0)
Hemoglobin: 13.7 g/dL (ref 12.0–15.0)
MCH: 31.1 pg (ref 26.0–34.0)
MCHC: 33.7 g/dL (ref 30.0–36.0)
MCV: 92.3 fL (ref 80.0–100.0)
Platelets: 195 10*3/uL (ref 150–400)
RBC: 4.41 MIL/uL (ref 3.87–5.11)
RDW: 13.1 % (ref 11.5–15.5)
WBC: 9.9 10*3/uL (ref 4.0–10.5)
nRBC: 0 % (ref 0.0–0.2)

## 2021-07-20 LAB — GASTROINTESTINAL PANEL BY PCR, STOOL (REPLACES STOOL CULTURE)

## 2021-07-20 LAB — BASIC METABOLIC PANEL
Anion gap: 7 (ref 5–15)
BUN: 11 mg/dL (ref 8–23)
CO2: 23 mmol/L (ref 22–32)
Calcium: 9.7 mg/dL (ref 8.9–10.3)
Chloride: 105 mmol/L (ref 98–111)
Creatinine, Ser: 0.77 mg/dL (ref 0.44–1.00)
GFR, Estimated: >60 mL/min (ref >60)
Glucose, Bld: 122 mg/dL — ABNORMAL HIGH (ref 70–99)
Potassium: 3.5 mmol/L (ref 3.5–5.1)
Sodium: 135 mmol/L (ref 135–145)

## 2021-07-20 LAB — CLOSTRIDIUM DIFFICILE BY PCR, REFLEXED: Toxigenic C. Difficile by PCR: POSITIVE — AB

## 2021-07-20 LAB — C DIFFICILE QUICK SCREEN W PCR REFLEX
C Diff antigen: POSITIVE — AB
C Diff toxin: NEGATIVE

## 2021-07-20 LAB — HM SIGMOIDOSCOPY

## 2021-07-20 SURGERY — SIGMOIDOSCOPY, FLEXIBLE
Anesthesia: General

## 2021-07-20 MED ORDER — PROPOFOL 500 MG/50ML IV EMUL
INTRAVENOUS | Status: DC | PRN
  Administered 2021-07-20: 125 ug/kg/min via INTRAVENOUS

## 2021-07-20 MED ORDER — METRONIDAZOLE 500 MG/100ML IV SOLN
500.0000 mg | Freq: Two times a day (BID) | INTRAVENOUS | Status: DC
  Administered 2021-07-20: 07:00:00 500 mg via INTRAVENOUS
  Filled 2021-07-20: qty 100

## 2021-07-20 MED ORDER — PROPOFOL 500 MG/50ML IV EMUL
INTRAVENOUS | Status: AC
  Filled 2021-07-20: qty 50

## 2021-07-20 MED ORDER — VANCOMYCIN HCL 125 MG PO CAPS: 125.0000 mg | ORAL_CAPSULE | Freq: Two times a day (BID) | ORAL | Status: DC

## 2021-07-20 MED ORDER — MORPHINE SULFATE (PF) 2 MG/ML IV SOLN: 1.0000 mg | INTRAVENOUS | Status: DC | PRN

## 2021-07-20 MED ORDER — LIDOCAINE HCL (CARDIAC) PF 100 MG/5ML IV SOSY
PREFILLED_SYRINGE | INTRAVENOUS | Status: DC | PRN
Start: 2021-07-20 — End: 2021-07-20
  Administered 2021-07-20: 100 mg via INTRAVENOUS

## 2021-07-20 MED ORDER — VANCOMYCIN HCL 125 MG PO CAPS
125.0000 mg | ORAL_CAPSULE | Freq: Four times a day (QID) | ORAL | Status: DC
  Administered 2021-07-20 – 2021-07-21 (×3): 125 mg via ORAL
  Filled 2021-07-20 (×6): qty 1

## 2021-07-20 MED ORDER — VANCOMYCIN HCL 125 MG PO CAPS: 125.0000 mg | ORAL_CAPSULE | Freq: Four times a day (QID) | ORAL | Status: DC

## 2021-07-20 MED ORDER — VANCOMYCIN HCL 125 MG PO CAPS
125.0000 mg | ORAL_CAPSULE | Freq: Four times a day (QID) | ORAL | Status: DC
  Filled 2021-07-20 (×2): qty 1

## 2021-07-20 MED ORDER — SODIUM CHLORIDE 0.9 % IV SOLN: INTRAVENOUS | Status: DC

## 2021-07-20 MED ORDER — PROPOFOL 10 MG/ML IV BOLUS
INTRAVENOUS | Status: DC | PRN
  Administered 2021-07-20: 60 mg via INTRAVENOUS

## 2021-07-20 MED ORDER — VANCOMYCIN HCL 125 MG PO CAPS: 125.0000 mg | ORAL_CAPSULE | ORAL | Status: DC

## 2021-07-20 MED ORDER — VANCOMYCIN HCL 125 MG PO CAPS: 125.0000 mg | ORAL_CAPSULE | Freq: Every day | ORAL | Status: DC

## 2021-07-20 MED ORDER — OXYCODONE-ACETAMINOPHEN 5-325 MG PO TABS: 1.0000 | ORAL_TABLET | Freq: Four times a day (QID) | ORAL | Status: DC | PRN

## 2021-07-20 MED ORDER — LIDOCAINE HCL (PF) 2 % IJ SOLN
INTRAMUSCULAR | Status: AC
  Filled 2021-07-20: qty 5

## 2021-07-20 NOTE — Progress Notes (Signed)
Patient arrived from Endo

## 2021-07-20 NOTE — Anesthesia Postprocedure Evaluation (Signed)
Anesthesia Post Note  Patient: [name]  Procedure(s) Performed: [place]  Patient location during evaluation: PACU Anesthesia Type: General Level of consciousness: awake and alert Pain management: pain level controlled Vital Signs Assessment: post-procedure vital signs reviewed and stable Respiratory status: spontaneous breathing, nonlabored ventilation, respiratory function stable and patient connected to nasal cannula oxygen Cardiovascular status: blood pressure returned to baseline and stable Postop Assessment: no apparent nausea or vomiting Anesthetic complications: no   No notable events documented.   Last Vitals:  Vitals:   07/20/21 1336 07/20/21 1418  BP: 133/63 112/60  Pulse: 86 82  Resp: 16 (!) 25  Temp: 36.4 C (!) 36.1 C  SpO2: 98% 96%    Last Pain:  Vitals:   07/20/21 1418  TempSrc: Temporal  PainSc: 0-No pain                 Arita Miss

## 2021-07-20 NOTE — ED Notes (Signed)
Tap water enema completed without incident.

## 2021-07-20 NOTE — Progress Notes (Signed)
Per Baxter Flattery, RN in the ER, the patient only drank approximately 2.5 cups of the colon prep prior to the planned Flexible Sigmoidoscopy with Dr. Haig Prophet.  Continues to have liquid, brown stool at this time.  I spoke with Dr. Haig Prophet and he asks that the patient be given a tap water enema prior to coming to the ENDO Unit for her procedure.  I placed the order and spoke with Baxter Flattery, RN in the ED.  She will administer the enema and notify our unit once given and we will bring patient to ENDO for her planned procedure.

## 2021-07-20 NOTE — Transfer of Care (Signed)
Immediate Anesthesia Transfer of Care Note  Patient: Carrie Roberson Course  Procedure(s) Performed: FLEXIBLE SIGMOIDOSCOPY  Patient Location: Endoscopy Unit  Anesthesia Type:General  Level of Consciousness: drowsy  Airway & Oxygen Therapy: Patient Spontanous Breathing  Post-op Assessment: Report given to RN and Post -op Vital signs reviewed and stable  Post vital signs: Reviewed and stable  Last Vitals:  Vitals Value Taken Time  BP 112/60 07/20/21 1418  Temp    Pulse 80 07/20/21 1418  Resp 31 07/20/21 1418  SpO2 92 % 07/20/21 1418  Vitals shown include unvalidated device data.  Last Pain:  Vitals:   07/20/21 1336  TempSrc: Temporal  PainSc: 0-No pain         Complications: No notable events documented.

## 2021-07-20 NOTE — Progress Notes (Signed)
Pharmacy Antibiotic Note  Carrie Roberson is a 75 y.o. female admitted on 07/19/2021 with intra-abdominal infection/colitis.  Pharmacy has been consulted for Levofloxacin dosing.  Plan: Pt already ordered Levaquin 500 mg q12h prior to consult order.  No changed needed at this time.  Pharmacy will continue to follow and will adjust abx dosing if warranted.  Height: 5\' 3"  (160 cm) Weight: 81.6 kg (180 lb) IBW/kg (Calculated) : 52.4  Temp (24hrs), Avg:98.3 F (36.8 C), Min:98.3 F (36.8 C), Max:98.3 F (36.8 C)  Recent Labs  Lab 07/19/21 1225 07/19/21 1727 07/19/21 2207  WBC 12.3*  --   --   CREATININE 0.70  --   --   LATICACIDVEN  --  1.1 1.5    Estimated Creatinine Clearance: 59.6 mL/min (by C-G formula based on SCr of 0.7 mg/dL).    Allergies  Allergen Reactions   Citalopram Nausea And Vomiting   Ivp Dye [Iodinated Contrast Media] Hives and Swelling    hives   Meloxicam     unknown   Adhesive [Tape] Rash   Amlodipine Rash   Ampicillin Rash    Did it involve swelling of the face/tongue/throat, SOB, or low BP? No Did it involve sudden or severe rash/hives, skin peeling, or any reaction on the inside of your mouth or nose? Yes Did you need to seek medical attention at a hospital or doctor's office? Yes When did it last happen?       10 + years If all above answers are NO, may proceed with cephalosporin use.   Cefoxitin Rash   Chlorhexidine Rash    Chloraprep Scrub Teal   Estradiol Rash   Estropipate Rash   Olmesartan Medoxomil-Hctz Rash   Sertraline Rash        Sulfa Antibiotics Rash    Antimicrobials this admission: 12/28 Flagyl >>  12/28 Cipro >>   Microbiology results: 12/28 BCx: Pending  Thank you for allowing pharmacy to be a part of this patients care. Renda Rolls, PharmD, [facility] 07/20/2021 2:33 AM

## 2021-07-20 NOTE — Op Note (Signed)
[facility]enter[facility] Patient Name: Carrie Roberson Procedure Date: 07/20/2021 1:46 PM MRN: [id number] Account #: [id number] Date of Birth: December 30, 1945 Admit Type: Inpatient Age: 75 Room: [facility] ENDO ROOM 3 Gender: Female Note Status: Finalized Instrument Name: Peds Colonoscope [id number] Procedure:             Flexible Sigmoidoscopy Indications:           Abnormal CT of the GI tract Providers:             Andrey Farmer MD, MD Medicines:             Monitored Anesthesia Care Complications:         No immediate complications. Estimated blood loss:                         Minimal. Procedure:             Pre-Anesthesia Assessment:                        - Prior to the procedure, a History and Physical was                         performed, and patient medications and allergies were                         reviewed. The patient is competent. The risks and                         benefits of the procedure and the sedation options and                         risks were discussed with the patient. All questions                         were answered and informed consent was obtained.                         Patient identification and proposed procedure were                         verified by the physician, the nurse, the                         anesthesiologist, the anesthetist and the technician                         in the pre-procedure area in the endoscopy suite.                         Mental Status Examination: alert and oriented. Airway                         Examination: normal oropharyngeal airway and neck                         mobility. Respiratory Examination: clear to                         auscultation. CV Examination: normal. Prophylactic  Antibiotics: The patient does not require prophylactic                         antibiotics. Prior Anticoagulants: The patient has                         taken no previous anticoagulant or  antiplatelet                         agents. ASA Grade Assessment: II - A patient with mild                         systemic disease. After reviewing the risks and                         benefits, the patient was deemed in satisfactory                         condition to undergo the procedure. The anesthesia                         plan was to use monitored anesthesia care (MAC).                         Immediately prior to administration of medications,                         the patient was re-assessed for adequacy to receive                         sedatives. The heart rate, respiratory rate, oxygen                         saturations, blood pressure, adequacy of pulmonary                         ventilation, and response to care were monitored                         throughout the procedure. The physical status of the                         patient was re-assessed after the procedure.                        After obtaining informed consent, the scope was passed                         under direct vision. The Colonoscope was introduced                         through the anus and advanced to the the left                         transverse colon. The flexible sigmoidoscopy was                         accomplished without difficulty. The patient tolerated  the procedure well. The quality of the bowel                         preparation was adequate. Findings:      The perianal and digital rectal examinations were normal.      Localized mild inflammation characterized by erythema and mucus was       found in the descending colon, at the splenic flexure and in the distal       transverse colon. Biopsies were taken with a cold forceps for histology.       Estimated blood loss was minimal.      Multiple small-mouthed diverticula were found in the sigmoid colon.      The exam was otherwise without abnormality. Impression:            - Localized mild inflammation  was found in the                         descending colon, at the splenic flexure and in the                         distal transverse colon secondary to colitis. Biopsied.                        - Diverticulosis in the sigmoid colon.                        - The examination was otherwise normal. Recommendation:        - Await pathology results.                        - Return patient to hospital ward for possible                         discharge same day.                        - Resume regular diet.                        - Return to GI clinic at appointment to be scheduled. Procedure Code(s):     --- Professional ---                        [phone], Sigmoidoscopy, flexible; with biopsy, single or                         multiple Diagnosis Code(s):     --- Professional ---                        K52.9, Noninfective gastroenteritis and colitis,                         unspecified                        K57.30, Diverticulosis of large intestine without                         perforation or abscess without bleeding  R93.3, Abnormal findings on diagnostic imaging of                         other parts of digestive tract CPT copyright 2019 American Medical Association. All rights reserved. The codes documented in this report are preliminary and upon coder review may  be revised to meet current compliance requirements. Andrey Farmer MD, MD 07/20/2021 2:21:31 PM Number of Addenda: 0 Note Initiated On: 07/20/2021 1:46 PM Total Procedure Duration: 0 hours 10 minutes 22 seconds  Estimated Blood Loss:  Estimated blood loss was minimal.      [facility]

## 2021-07-20 NOTE — Anesthesia Preprocedure Evaluation (Signed)
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)    GI/Hepatic GERD  ,Colitis    Endo/Other  Obesity   Renal/GU negative Renal ROS     Musculoskeletal  (+) Arthritis ,   Abdominal   Peds  Hematology negative hematology ROS (+)   Anesthesia Other Findings   Reproductive/Obstetrics Ovarian CA                             Anesthesia Physical Anesthesia Plan  ASA: 3  Anesthesia Plan: General   Post-op Pain Management:    Induction: Intravenous  PONV Risk Score and Plan: 3 and Propofol infusion, TIVA and Treatment may vary due to age or medical condition  Airway Management Planned: Natural Airway  Additional Equipment:   Intra-op Plan:   Post-operative Plan:   Informed Consent: I have reviewed the patients History and Physical, chart, labs and discussed the procedure including the risks, benefits and alternatives for the proposed anesthesia with the patient or authorized representative who has indicated his/her understanding and acceptance.       Plan Discussed with: CRNA  Anesthesia Plan Comments: (LMA/GETA backup discussed.  Patient consented for risks of anesthesia including but not limited to:  - adverse reactions to medications - damage to eyes, teeth, lips or other oral mucosa - nerve  damage due to positioning  - sore throat or hoarseness - damage to heart, brain, nerves, lungs, other parts of body or loss of life  Informed patient about role of CRNA in peri- and intra-operative care.  Patient voiced understanding.)        Anesthesia Quick Evaluation

## 2021-07-20 NOTE — Progress Notes (Signed)
Patient to be admitted to room 212. Report called to [facility] on 2C. Patient aware of admission and sister aware of admission. Patient also aware of C diff positive and enteric isolation and using soap to wash hands.

## 2021-07-20 NOTE — Consult Note (Addendum)
Pharmacy Antibiotic Note  Carrie Roberson is a 75 y.o. female admitted on 07/19/2021 with  C. diff .  Pharmacy has been consulted for Oral Vancomycin dosing.  Plan: Vancomycin 125mg  QID x 10 days  Height: 5\' 3"  (160 cm) Weight: 81.6 kg (180 lb) IBW/kg (Calculated) : 52.4  Temp (24hrs), Avg:97.7 F (36.5 C), Min:97 F (36.1 C), Max:98.5 F (36.9 C)  Recent Labs  Lab 07/19/21 1225 07/19/21 1727 07/19/21 2207 07/20/21 0727  WBC 12.3*  --   --  9.9  CREATININE 0.70  --   --  0.77  LATICACIDVEN  --  1.1 1.5  --     Estimated Creatinine Clearance: 59.6 mL/min (by C-G formula based on SCr of 0.77 mg/dL).    Allergies  Allergen Reactions   Citalopram Nausea And Vomiting   Ivp Dye [Iodinated Contrast Media] Hives and Swelling    hives   Meloxicam     unknown   Adhesive [Tape] Rash   Amlodipine Rash   Ampicillin Rash    Did it involve swelling of the face/tongue/throat, SOB, or low BP? No Did it involve sudden or severe rash/hives, skin peeling, or any reaction on the inside of your mouth or nose? Yes Did you need to seek medical attention at a hospital or doctor's office? Yes When did it last happen?       10 + years If all above answers are NO, may proceed with cephalosporin use.   Cefoxitin Rash   Chlorhexidine Rash    Chloraprep Scrub Teal   Estradiol Rash   Estropipate Rash   Olmesartan Medoxomil-Hctz Rash   Sertraline Rash        Sulfa Antibiotics Rash    Antimicrobials this admission: Ciprofloxacin  12/28>>12/29 Metronidazole 12/28 >> 12/29 Vancomycin po 12/29 >>   Dose adjustments this admission: none  Microbiology results: 12/28: C. Diff Ag - positive  Thank you for allowing pharmacy to be a part of this patients care.  Avital Dancy Rodriguez-Guzman PharmD, BCPS 07/20/2021 5:23 PM

## 2021-07-20 NOTE — Progress Notes (Addendum)
PROGRESS NOTE    Carrie Roberson  ZOX:[id number] DOB: May 18, 1946 DOA: 07/19/2021 PCP: Juline Patch, MD    Assessment & Plan:   Principal Problem:   Colitis Active Problems:   Anxiety   DDD (degenerative disc disease), thoracic   Obesity, Class I, BMI 30.0-34.9 (see actual BMI)   IBS (irritable bowel syndrome)   GERD (gastroesophageal reflux disease)   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HCC)   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)   Anxiety associated with cancer diagnosis ([place])   Chemotherapy-induced peripheral neuropathy (HCC)   Acute colitis   Colitis: w/ bloody bowel movements. Etiology unclear, ischemic vs infectious colitis. Continue on IVFs. GI PCR panel was neg. C.diff is positive but toxin is little to none, so will start po vanco as per GI. S/p flex sigmoidoscopy which showed mild inflammation of descending colon, splenic flexure & distal transverse colon secondary to colitis which was biopsied. Pathology is pending   Leukocytosis: likely secondary to above    HTN: hold home dose of lisinopril    Anxiety: severity unknown. Continue on ativan prn    Hx of ovarian cancer: outpatient follow-up with obgyn  DVT prophylaxis: SCDs Code Status: full  Family Communication: discussed pt's care w/ pt's family at bedside and answered her questions  Disposition Plan: likely d/c back home   Level of care: Telemetry Medical  Status is: Observation  The patient remains OBS appropriate and will d/c before 2 midnights.    Consultants:  GI   Procedures:   Antimicrobials: po vanco    Subjective: Pt c/o bloody diarrhea   Objective: Vitals:   07/20/21 1438 07/20/21 1458 07/20/21 1508 07/20/21 1518  BP: 129/85   132/78  Pulse: 84 82 81 82  Resp: 19 18 17 20   Temp:      TempSrc:      SpO2: 99% 99% 96% 97%  Weight:      Height:        Intake/Output Summary (Last 24 hours) at 07/20/2021 1628 Last data filed at  07/20/2021 1413 Gross per 24 hour  Intake 300 ml  Output --  Net 300 ml   Filed Weights   07/19/21 1223  Weight: 81.6 kg    Examination:  General exam: Appears calm but uncomfortable  Respiratory system: Clear to auscultation. Respiratory effort normal. Cardiovascular system: S1 & S2 +. No rubs, gallops or clicks.  Gastrointestinal system: Abdomen is nondistended, soft and nontender.  Normal bowel sounds heard. Central nervous system: Alert and oriented. Moves all extremities  Psychiatry: Judgement and insight appear normal. Mood & affect appropriate.     Data Reviewed: I have personally reviewed following labs and imaging studies  CBC: Recent Labs  Lab 07/19/21 1225 07/19/21 1717 07/20/21 0727  WBC 12.3*  --  9.9  HGB 15.1* 14.9 13.7  HCT 45.2 45.0 40.7  MCV 91.9  --  92.3  PLT 235  --  811   Basic Metabolic Panel: Recent Labs  Lab 07/19/21 1225 07/19/21 2207 07/20/21 0727  NA 136  --  135  K 4.0  --  3.5  CL 106  --  105  CO2 24  --  23  GLUCOSE 123*  --  122*  BUN 11  --  11  CREATININE 0.70  --  0.77  CALCIUM 10.1  --  9.7  MG  --  2.0  --   PHOS  --  2.8  --    GFR: Estimated Creatinine  Clearance: 59.6 mL/min (by C-G formula based on SCr of 0.77 mg/dL). Liver Function Tests: Recent Labs  Lab 07/19/21 1225  AST 25  ALT 28  ALKPHOS 104  BILITOT 0.8  PROT 6.9  ALBUMIN 3.9   No results for input(s): LIPASE, AMYLASE in the last 168 hours. No results for input(s): AMMONIA in the last 168 hours. Coagulation Profile: No results for input(s): INR, PROTIME in the last 168 hours. Cardiac Enzymes: No results for input(s): CKTOTAL, CKMB, CKMBINDEX, TROPONINI in the last 168 hours. BNP (last 3 results) No results for input(s): PROBNP in the last 8760 hours. HbA1C: No results for input(s): HGBA1C in the last 72 hours. CBG: No results for input(s): GLUCAP in the last 168 hours. Lipid Profile: No results for input(s): CHOL, HDL, LDLCALC, TRIG,  CHOLHDL, LDLDIRECT in the last 72 hours. Thyroid Function Tests: Recent Labs    07/19/21 2207  TSH 1.535   Anemia Panel: No results for input(s): VITAMINB12, FOLATE, FERRITIN, TIBC, IRON, RETICCTPCT in the last 72 hours. Sepsis Labs: Recent Labs  Lab 07/19/21 1727 07/19/21 2207  LATICACIDVEN 1.1 1.5    Recent Results (from the past 240 hour(s))  Blood culture (single)     Status: None (Preliminary result)   Collection Time: 07/19/21  5:16 PM   Specimen: BLOOD  Result Value Ref Range Status   Specimen Description BLOOD RIGHT ANTECUBITAL  Final   Special Requests   Final    BOTTLES DRAWN AEROBIC AND ANAEROBIC Blood Culture results may not be optimal due to an inadequate volume of blood received in culture bottles   Culture   Final    NO GROWTH < 12 HOURS Performed at [facility], [address], [place], [place] [address]    Report Status PENDING  Incomplete  Resp Panel by RT-PCR (Flu A&B, Covid) Nasopharyngeal Swab     Status: None   Collection Time: 07/19/21  6:29 PM   Specimen: Nasopharyngeal Swab; Nasopharyngeal(NP) swabs in vial transport medium  Result Value Ref Range Status   SARS Coronavirus 2 by RT PCR NEGATIVE NEGATIVE Final    Comment: (NOTE) SARS-CoV-2 target nucleic acids are NOT DETECTED.  The SARS-CoV-2 RNA is generally detectable in upper respiratory specimens during the acute phase of infection. The lowest concentration of SARS-CoV-2 viral copies this assay can detect is 138 copies/mL. A negative result does not preclude SARS-Cov-2 infection and should not be used as the sole basis for treatment or other patient management decisions. A negative result may occur with  improper specimen collection/handling, submission of specimen other than nasopharyngeal swab, presence of viral mutation(s) within the areas targeted by this assay, and inadequate number of viral copies(<138 copies/mL). A negative result must be combined with clinical  observations, patient history, and epidemiological information. The expected result is Negative.  Fact Sheet for Patients:  EntrepreneurPulse.com.au  Fact Sheet for Healthcare Providers:  IncredibleEmployment.be  This test is no t yet approved or cleared by the [place] FDA and  has been authorized for detection and/or diagnosis of SARS-CoV-2 by FDA under an Emergency Use Authorization (EUA). This EUA will remain  in effect (meaning this test can be used) for the duration of the COVID-19 declaration under Section 564(b)(1) of the Act, 21 U.S.C.section 360bbb-3(b)(1), unless the authorization is terminated  or revoked sooner.       Influenza A by PCR NEGATIVE NEGATIVE Final   Influenza B by PCR NEGATIVE NEGATIVE Final    Comment: (NOTE) The Xpert Xpress SARS-CoV-2/FLU/RSV plus assay  is intended as an aid in the diagnosis of influenza from Nasopharyngeal swab specimens and should not be used as a sole basis for treatment. Nasal washings and aspirates are unacceptable for Xpert Xpress SARS-CoV-2/FLU/RSV testing.  Fact Sheet for Patients: EntrepreneurPulse.com.au  Fact Sheet for Healthcare Providers: IncredibleEmployment.be  This test is not yet approved or cleared by the [place] FDA and has been authorized for detection and/or diagnosis of SARS-CoV-2 by FDA under an Emergency Use Authorization (EUA). This EUA will remain in effect (meaning this test can be used) for the duration of the COVID-19 declaration under Section 564(b)(1) of the Act, 21 U.S.C. section 360bbb-3(b)(1), unless the authorization is terminated or revoked.  Performed at [facility], [place], [place] [address]   C Difficile Quick Screen w PCR reflex     Status: Abnormal   Collection Time: 07/19/21 10:07 PM   Specimen: STOOL  Result Value Ref Range Status   C Diff antigen POSITIVE (A) NEGATIVE  Final   C Diff toxin NEGATIVE NEGATIVE Final   C Diff interpretation Results are indeterminate. See PCR results.  Final    Comment: Performed at [facility], [address] [place], [place] [address]  Gastrointestinal Panel by PCR , Stool     Status: None   Collection Time: 07/19/21 10:07 PM   Specimen: STOOL  Result Value Ref Range Status   Campylobacter species NOT DETECTED NOT DETECTED Final   Plesimonas shigelloides NOT DETECTED NOT DETECTED Final   Salmonella species NOT DETECTED NOT DETECTED Final   Yersinia enterocolitica NOT DETECTED NOT DETECTED Final   Vibrio species NOT DETECTED NOT DETECTED Final   Vibrio cholerae NOT DETECTED NOT DETECTED Final   Enteroaggregative E coli (EAEC) NOT DETECTED NOT DETECTED Final   Enteropathogenic E coli (EPEC) NOT DETECTED NOT DETECTED Final   Enterotoxigenic E coli (ETEC) NOT DETECTED NOT DETECTED Final   Shiga like toxin producing E coli (STEC) NOT DETECTED NOT DETECTED Final   Shigella/Enteroinvasive E coli (EIEC) NOT DETECTED NOT DETECTED Final   Cryptosporidium NOT DETECTED NOT DETECTED Final   Cyclospora cayetanensis NOT DETECTED NOT DETECTED Final   Entamoeba histolytica NOT DETECTED NOT DETECTED Final   Giardia lamblia NOT DETECTED NOT DETECTED Final   Adenovirus F40/41 NOT DETECTED NOT DETECTED Final   Astrovirus NOT DETECTED NOT DETECTED Final   Norovirus GI/GII NOT DETECTED NOT DETECTED Final   Rotavirus A NOT DETECTED NOT DETECTED Final   Sapovirus (I, II, IV, and V) NOT DETECTED NOT DETECTED Final    Comment: Performed at [facility], [address] [place], [place] [address]  C. Diff by PCR, Reflexed     Status: Abnormal   Collection Time: 07/19/21 10:07 PM  Result Value Ref Range Status   Toxigenic C. Difficile by PCR POSITIVE (A) NEGATIVE Final    Comment: Positive for toxigenic C. difficile with little to no toxin production. Only treat if clinical presentation suggests symptomatic  illness. Performed at [facility], [address], [place],  [address]          Radiology Studies: CT Abdomen Pelvis Wo Contrast  Result Date: 07/19/2021 CLINICAL DATA:  RLQ abdominal pain (Age >= 14y) EXAM: CT ABDOMEN AND PELVIS WITHOUT CONTRAST TECHNIQUE: Multidetector CT imaging of the abdomen and pelvis was performed following the standard protocol without IV contrast. COMPARISON:  July 2021 FINDINGS: Lower chest: No acute abnormality. Hepatobiliary: No focal liver abnormality is seen. Status post cholecystectomy. No unexpected biliary dilatation.  Pancreas: Unremarkable. Spleen: Unremarkable. Adrenals/Urinary Tract: Adrenals are unremarkable. Small right renal cyst. Bladder is poorly distended. Stomach/Bowel: Stomach is within normal limits. Bowel is normal in caliber. There is wall thickening with surrounding fat infiltration along the distal transverse colon, splenic flexure, and descending colon. Sigmoid diverticulosis. Vascular/Lymphatic: Aortic atherosclerosis.  No enlarged nodes. Reproductive: Status post hysterectomy. No adnexal masses. Other: No free fluid. Small fat containing ventral abdominal hernias. Musculoskeletal: Degenerative changes of the included spine. IMPRESSION: Abnormal appearance of distal transverse colon, splenic flexure, descending colon. May reflect infectious/inflammatory colitis or ischemic colitis. Sigmoid diverticulosis. Aortic atherosclerosis. Electronically Signed   By: Macy Mis M.D.   On: 07/19/2021 13:42        Scheduled Meds:   Continuous Infusions:  sodium chloride 75 mL/hr at 07/20/21 0840   sodium chloride 20 mL/hr at 07/20/21 1354     LOS: 0 days    Time spent: 33 mins     Wyvonnia Dusky, MD [facility] Pager [phone]  If 7PM-7AM, please contact night-coverage 07/20/2021, 4:28 PM

## 2021-07-21 ENCOUNTER — Other Ambulatory Visit (HOSPITAL_COMMUNITY): Payer: Self-pay

## 2021-07-21 ENCOUNTER — Encounter: Payer: Self-pay | Admitting: Oncology

## 2021-07-21 DIAGNOSIS — K529 Noninfective gastroenteritis and colitis, unspecified: Secondary | ICD-10-CM | POA: Diagnosis not present

## 2021-07-21 DIAGNOSIS — I10 Essential (primary) hypertension: Secondary | ICD-10-CM

## 2021-07-21 DIAGNOSIS — C561 Malignant neoplasm of right ovary: Secondary | ICD-10-CM

## 2021-07-21 LAB — BASIC METABOLIC PANEL
Anion gap: 3 — ABNORMAL LOW (ref 5–15)
BUN: 10 mg/dL (ref 8–23)
CO2: 25 mmol/L (ref 22–32)
Calcium: 9.4 mg/dL (ref 8.9–10.3)
Chloride: 109 mmol/L (ref 98–111)
Creatinine, Ser: 0.61 mg/dL (ref 0.44–1.00)
GFR, Estimated: >60 mL/min (ref >60)
Glucose, Bld: 109 mg/dL — ABNORMAL HIGH (ref 70–99)
Potassium: 3.3 mmol/L — ABNORMAL LOW (ref 3.5–5.1)
Sodium: 137 mmol/L (ref 135–145)

## 2021-07-21 LAB — CBC
HCT: 35.5 % — ABNORMAL LOW (ref 36.0–46.0)
Hemoglobin: 12 g/dL (ref 12.0–15.0)
MCH: 31.3 pg (ref 26.0–34.0)
MCHC: 33.8 g/dL (ref 30.0–36.0)
MCV: 92.7 fL (ref 80.0–100.0)
Platelets: 178 10*3/uL (ref 150–400)
RBC: 3.83 MIL/uL — ABNORMAL LOW (ref 3.87–5.11)
RDW: 13 % (ref 11.5–15.5)
WBC: 8.7 10*3/uL (ref 4.0–10.5)
nRBC: 0 % (ref 0.0–0.2)

## 2021-07-21 MED ORDER — FLUCONAZOLE 150 MG PO TABS: 150.0000 mg | ORAL_TABLET | Freq: Once | ORAL | 1 refills | Status: AC

## 2021-07-21 MED ORDER — VANCOMYCIN HCL 125 MG PO CAPS: 125.0000 mg | ORAL_CAPSULE | Freq: Four times a day (QID) | ORAL | 0 refills | Status: AC

## 2021-07-21 MED ORDER — POTASSIUM CHLORIDE CRYS ER 20 MEQ PO TBCR
40.0000 meq | EXTENDED_RELEASE_TABLET | Freq: Once | ORAL | Status: AC
  Administered 2021-07-21: 09:00:00 40 meq via ORAL
  Filled 2021-07-21: qty 2

## 2021-07-21 NOTE — TOC Initial Note (Signed)
Transition of Care [facility]) - Initial/Assessment Note    Patient Details  Name: Carrie Roberson MRN: [id number] Date of Birth: 01-12-1946  Transition of Care [facility]) CM/SW Contact:    Beverly Sessions, RN Phone Number: 07/21/2021, 10:01 AM  Clinical Narrative:                  Transition of Care [facility]) Screening Note   Patient Details  Name: Carrie Roberson Date of Birth: 1946/04/27   Transition of Care [facility]) CM/SW Contact:    Beverly Sessions, RN Phone Number: 07/21/2021, 10:01 AM    Transition of Care Department [facility]) has reviewed patient and no TOC needs have been identified at this time. We will continue to monitor patient advancement through interdisciplinary progression rounds. If new patient transition needs arise, please place a TOC consult.          Patient Goals and CMS Choice        Expected Discharge Plan and Services                                                Prior Living Arrangements/Services                       Activities of Daily Living Home Assistive Devices/Equipment: None ADL Screening (condition at time of admission) Patient's cognitive ability adequate to safely complete daily activities?: Yes Is the patient deaf or have difficulty hearing?: No Does the patient have difficulty seeing, even when wearing glasses/contacts?: No Does the patient have difficulty concentrating, remembering, or making decisions?: No Patient able to express need for assistance with ADLs?: Yes Does the patient have difficulty dressing or bathing?: No Independently performs ADLs?: Yes (appropriate for developmental age) Does the patient have difficulty walking or climbing stairs?: No Weakness of Legs: None Weakness of Arms/Hands: None  Permission Sought/Granted                  Emotional Assessment              Admission diagnosis:  Colitis [K52.9] Ischemic colitis ([place]) [K55.9] Acute colitis [K52.9] Patient Active Problem List    Diagnosis Date Noted   Acute colitis 07/20/2021   Colitis 07/19/2021   Chemotherapy induced neutropenia ([place]) 05/02/2021   Chemotherapy-induced peripheral neuropathy ([place]) 02/24/2020   Genetic testing 11/02/2019   Weight loss, unintentional 10/16/2019   Chemotherapy induced diarrhea 10/13/2019   Orthostasis 10/13/2019   Hypokalemia 10/13/2019   Restless leg 10/13/2019   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 10/13/2019   Iron deficiency 10/02/2019   Anxiety associated with cancer diagnosis ([place]) 10/02/2019   Chemotherapy-induced nausea 10/02/2019   Thyroid nodule 10/02/2019   Hyponatremia 10/02/2019   B12 deficiency 09/18/2019   Family history of breast cancer    Family history of leukemia    Goals of care, counseling/discussion 09/07/2019   Ovarian mass 08/19/2019   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) 08/19/2019   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[place])    Cyst of ovary 07/15/2019   Anxiety 09/02/2017   DDD (degenerative disc disease), 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   FH: diabetes mellitus 09/02/2017   Obesity, Class I, BMI 30.0-34.9 (see actual BMI) 09/02/2017   Atypical nevus 09/02/2017   IBS (irritable bowel syndrome) 09/02/2017   Atrophic vaginitis 09/02/2017   Chronic idiopathic urticaria 09/02/2017  GERD (gastroesophageal reflux disease) 09/02/2017   PCP:  Juline Patch, MD Pharmacy:   [id number] - [place], [place] [address] [place] [address] Phone: [phone] Fax: [phone]     Social Determinants of Health (SDOH) Interventions    Readmission Risk Interventions No flowsheet data found.

## 2021-07-21 NOTE — Discharge Summary (Addendum)
Physician Discharge Summary  [name]:[id number] DOB: 29-Jan-1946 DOA: 07/19/2021  PCP: Juline Patch, MD  Admit date: 07/19/2021 Discharge date: 07/21/2021  Admitted From: home  Disposition:  home   Recommendations for Outpatient Follow-up:  Follow up with PCP in 1-2 weeks F/u w/ GI, Dr. Haig Prophet, in 1-2 weeks   Home Health: no  Equipment/Devices:  Discharge Condition: stable  CODE STATUS: full  Diet recommendation: Heart Healthy    Brief/Interim Summary: HPI was taken from Dr. Tobie Roberson: Carrie Roberson is a 75 y.o. female with medical history significant for anxiety, diverticulitis, GERD, history of ischemic colitis, hypertension, IBS, ovarian cancer, status postchemotherapy in 2021, 6 treatments, vaginal atrophy, who presents emergency department for chief concerns of abdominal pain.   At bedside patient is able to tell me her full name, her age, and her location of hospital.  She provided the full H&P.   She reports the abdominal pain, described as cramping, developed 12/27. She had a large bowel movement and it was watery and brown. She denies recent antibiotic use. This AM, she had granular particles mixed with bright red blood. She reports she had about 4-5 bloody bowel movements today.  She reports the last 2-3 bowel movements were all blood. She reports this is similar to prior episode of ischemic colitis more than 13 years ago.    She endorses nausea and vomiting one time yesterday, 12/27.  She reports her last colonoscopy was 3 years ago.  She was due for a colonoscopy about 1 to 2 months ago, and she was scheduled for it.  She took the prep and developed intractable nausea and vomiting and was not able to complete full prep.  Her colonoscopy was canceled.   Social history: She lives with her sister at home. She denies history of smoking. She infrequently drinks etoh, she drinks about 1 glasses of wine once or twice per day. She formerly worked as a Chartered certified accountant.     Vaccination history: She has had three covid 19 vaccines    Hospital course from Dr. Jimmye Norman 12/29-12/30/22: Pt presented w/ bloody bowel movements which was thought to be secondary to colitis. Pt was found to have c. diff and pt was started on po vanco. GI was consulted and pt had flex sigmoidoscopy which showed mild inflammation of the descending colon, splenic flexure & distal transverse colon secondary to colitis which was biopsied. Pathology was pending at the time of d/c. Pt will f/u w/ GI, Dr. Haig Prophet in 1-2 weeks to f/u and get results of the pathology. The bloody diarrhea and abd pain had resolved prior to d/c. For more information, please see previous progress/consult notes.   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 Etiology unclear, ischemic vs infectious colitis. GI PCR panel was neg. C.diff is positive but toxin is little to none, so will continue on po vanco. S/p flex sigmoidoscopy which showed mild inflammation of descending colon, splenic flexure & distal transverse colon secondary to colitis which was biopsied. Pathology is pending   Leukocytosis: resolved  HTN: restart home dose of lisinopril at d/c   Anxiety: severity unknown. Continue on ativan prn    Hx of ovarian cancer: outpatient follow-up with obgyn  Obesity: BMI 31.8. Would benefit from weight loss   Discharge Instructions  Discharge Instructions     Diet - low sodium heart healthy   Complete by: As directed     Discharge instructions   Complete by: As directed    F/u w/ PCP in 1-2 weeks. F/u w/ GI, Dr. Haig Prophet, in 1-2 weeks   Increase activity slowly   Complete by: As directed       Allergies as of 07/21/2021       Reactions   Citalopram Nausea And Vomiting   Ivp Dye [iodinated Contrast Media] Hives, Swelling   hives   Meloxicam    unknown   Adhesive [tape] Rash   Amlodipine Rash   Ampicillin Rash   Did it involve swelling of the face/tongue/throat, SOB, or low BP? No Did it involve sudden or severe rash/hives, skin peeling, or any reaction on the inside of your mouth or nose? Yes Did you need to seek medical attention at a hospital or doctor's office? Yes When did it last happen?       10 + years If all above answers are NO, may proceed with cephalosporin use.   Cefoxitin Rash   Chlorhexidine Rash   Chloraprep Scrub Teal   Estradiol Rash   Estropipate Rash   Olmesartan Medoxomil-hctz Rash   Sertraline Rash      Sulfa Antibiotics Rash        Medication List     TAKE these medications    acetaminophen 500 MG tablet Commonly known as: TYLENOL Take 1,000 mg by mouth every 6 (six) hours as needed (for pain.).   Cranberry 300 MG tablet Take 300 mg by mouth 2 (two) times daily.   fluconazole 150 MG tablet Commonly known as: Diflucan Take 1 tablet (150 mg total) by mouth once for 1 dose.   fluticasone 50 MCG/ACT nasal spray Commonly known as: FLONASE Place 1 spray into both nostrils daily. otc   lisinopril 5 MG tablet Commonly known as: ZESTRIL Take 1 tablet (5 mg total) by mouth daily.   loratadine 10 MG tablet Commonly known as: CLARITIN Take 10 mg by mouth daily. otc   LORazepam 0.5 MG tablet Commonly known as: ATIVAN TAKE 1 TABLET (0.5 MG TOTAL) BY MOUTH EVERY 6 (SIX) HOURS AS NEEDED (NAUSEA/VOMITING OR ANXIETY).   mirabegron ER 50 MG Tb24 tablet Commonly known as: Myrbetriq Take 1 tablet (50 mg total) by mouth daily.   pregabalin 75 MG  capsule Commonly known as: Lyrica Take 1 capsule (75 mg total) by mouth 2 (two) times daily.   vancomycin 125 MG capsule Commonly known as: VANCOCIN Take 1 capsule (125 mg total) by mouth 4 (four) times daily for 9 days.        Allergies  Allergen Reactions   Citalopram Nausea And Vomiting   Ivp Dye [Iodinated Contrast Media] Hives and Swelling    hives   Meloxicam     unknown   Adhesive [Tape] Rash   Amlodipine Rash   Ampicillin Rash    Did it involve swelling of the face/tongue/throat, SOB, or low BP? No Did it involve sudden or severe rash/hives, skin peeling, or any reaction on the inside of your mouth or nose? Yes Did you need to seek medical attention at a hospital or doctor's office? Yes When did it  last happen?       10 + years If all above answers are NO, may proceed with cephalosporin use.   Cefoxitin Rash   Chlorhexidine Rash    Chloraprep Scrub Teal   Estradiol Rash   Estropipate Rash   Olmesartan Medoxomil-Hctz Rash   Sertraline Rash        Sulfa Antibiotics Rash    Consultations: GI    Procedures/Studies: CT Abdomen Pelvis Wo Contrast  Result Date: 07/19/2021 CLINICAL DATA:  RLQ abdominal pain (Age >= 14y) EXAM: CT ABDOMEN AND PELVIS WITHOUT CONTRAST TECHNIQUE: Multidetector CT imaging of the abdomen and pelvis was performed following the standard protocol without IV contrast. COMPARISON:  July 2021 FINDINGS: Lower chest: No acute abnormality. Hepatobiliary: No focal liver abnormality is seen. Status post cholecystectomy. No unexpected biliary dilatation. Pancreas: Unremarkable. Spleen: Unremarkable. Adrenals/Urinary Tract: Adrenals are unremarkable. Small right renal cyst. Bladder is poorly distended. Stomach/Bowel: Stomach is within normal limits. Bowel is normal in caliber. There is wall thickening with surrounding fat infiltration along the distal transverse colon, splenic flexure, and descending colon. Sigmoid diverticulosis. Vascular/Lymphatic:  Aortic atherosclerosis.  No enlarged nodes. Reproductive: Status post hysterectomy. No adnexal masses. Other: No free fluid. Small fat containing ventral abdominal hernias. Musculoskeletal: Degenerative changes of the included spine. IMPRESSION: Abnormal appearance of distal transverse colon, splenic flexure, descending colon. May reflect infectious/inflammatory colitis or ischemic colitis. Sigmoid diverticulosis. Aortic atherosclerosis. Electronically Signed   By: Macy Mis M.D.   On: 07/19/2021 13:42   MM CLIP PLACEMENT RIGHT  Result Date: 06/28/2021 CLINICAL DATA:  Status post ultrasound-guided biopsy EXAM: 3D DIAGNOSTIC RIGHT MAMMOGRAM POST ULTRASOUND BIOPSY COMPARISON:  Previous exam(s). FINDINGS: 3D Mammographic images were obtained following ultrasound guided biopsy of masslike area in the RIGHT breast. The RIBBON biopsy marking clip is in expected position at the site of biopsy. IMPRESSION: Appropriate positioning of the RIBBON shaped biopsy marking clip at the site of biopsy in the RIGHT retroareolar breast. Final Assessment: Post Procedure Mammograms for Marker Placement Electronically Signed   By: Valentino Saxon M.D.   On: 06/28/2021 13:55  [place] RT BREAST BX W LOC DEV 1ST LESION IMG BX SPEC US GUIDE  Addendum Date: 06/30/2021   ADDENDUM REPORT: 06/30/2021 10:04 ADDENDUM: Pathology revealed BENIGN BREAST TISSUE WITH ORGANIZING FAT NECROSIS- NEGATIVE FOR ATYPIA AND MALIGNANCY of the RIGHT breast, 11 o'clock, 1cmfn (ribbon clip). This was found to be concordant by Dr. Valentino Saxon. Pathology results were discussed with the patient by telephone. The patient reported doing well after the biopsy with tenderness at the site. She reported skin rash almost completely resolved. Post biopsy instructions and care were reviewed and questions were answered. The patient was encouraged to call [facility] for any additional concerns. The patient was  instructed to return for annual screening mammography due October 2023 and informed a reminder notice would be sent regarding this appointment. Pathology results reported by Stacie Acres RN on 06/30/2021. Electronically Signed   By: Valentino Saxon M.D.   On: 06/30/2021 10:04   Result Date: 06/30/2021 CLINICAL DATA:  Indeterminate sonographically identified masslike area EXAM: ULTRASOUND GUIDED RIGHT BREAST CORE NEEDLE BIOPSY COMPARISON:  Previous exam(s). PROCEDURE: I met with the patient and we discussed the procedure of ultrasound-guided biopsy, including benefits and alternatives. We discussed the high likelihood of a successful procedure. We discussed the risks of the procedure, including infection, bleeding, tissue injury, clip migration, and inadequate sampling. Informed written consent was given. The usual time-out  protocol was performed immediately prior to the procedure. Lesion quadrant: Upper outer quadrant Using sterile technique and 1% lidocaine and 1% lidocaine with epinephrine as local anesthetic, under direct ultrasound visualization, a 14 gauge spring-loaded device was used to perform biopsy of a masslike area in the retroareolar breast using a inferolateral approach. At the conclusion of the procedure a RIBBON tissue marker clip was deployed into the biopsy cavity. Follow up 2 view mammogram was performed and dictated separately. IMPRESSION: Ultrasound guided biopsy of a masslike area in the RIGHT breast. Patient experienced a rash to the ChloraPrep scrub Teal solution. This was removed from her skin as completely as possible. A hypoallergenic dressing was applied with warnings to remove if rash worsened adjacent to the dressing. Chart was updated. Electronically Signed: By: Valentino Saxon M.D. On: 06/28/2021 14:02   (Echo, Carotid, EGD, Colonoscopy, ERCP)    Subjective:   Discharge Exam: Vitals:   07/21/21 0343 07/21/21 0803  BP: 126/63 (!) 145/70  Pulse: 71 67  Resp: 18 20   Temp: 98.5 F (36.9 C) 97.9 F (36.6 C)  SpO2: 97% 95%   Vitals:   07/20/21 1647 07/20/21 2059 07/21/21 0343 07/21/21 0803  BP: 132/66 (!) 122/57 126/63 (!) 145/70  Pulse: 86 76 71 67  Resp: 17 20 18 20   Temp: 98.5 F (36.9 C) 99.7 F (37.6 C) 98.5 F (36.9 C) 97.9 F (36.6 C)  TempSrc:      SpO2: 99% 97% 97% 95%  Weight:      Height:        General: Pt is alert, awake, not in acute distress Cardiovascular: S1/S2 +, no rubs, no gallops Respiratory: CTA bilaterally, no wheezing, no rhonchi Abdominal: Soft, NT, obese, bowel sounds + Extremities:  no cyanosis    The results of significant diagnostics from this hospitalization (including imaging, microbiology, ancillary and laboratory) are listed below for reference.     Microbiology: Recent Results (from the past 240 hour(s))  Blood culture (single)     Status: None (Preliminary result)   Collection Time: 07/19/21  5:16 PM   Specimen: BLOOD  Result Value Ref Range Status   Specimen Description BLOOD RIGHT ANTECUBITAL  Final   Special Requests   Final    BOTTLES DRAWN AEROBIC AND ANAEROBIC Blood Culture results may not be optimal due to an inadequate volume of blood received in culture bottles   Culture   Final    NO GROWTH 2 DAYS Performed at [facility], [address], [place], [place] [address]    Report Status PENDING  Incomplete  Resp Panel by RT-PCR (Flu A&B, Covid) Nasopharyngeal Swab     Status: None   Collection Time: 07/19/21  6:29 PM   Specimen: Nasopharyngeal Swab; Nasopharyngeal(NP) swabs in vial transport medium  Result Value Ref Range Status   SARS Coronavirus 2 by RT PCR NEGATIVE NEGATIVE Final    Comment: (NOTE) SARS-CoV-2 target nucleic acids are NOT DETECTED.  The SARS-CoV-2 RNA is generally detectable in upper respiratory specimens during the acute phase of infection. The lowest concentration of SARS-CoV-2 viral copies this assay can detect is 138 copies/mL. A negative result does  not preclude SARS-Cov-2 infection and should not be used as the sole basis for treatment or other patient management decisions. A negative result may occur with  improper specimen collection/handling, submission of specimen other than nasopharyngeal swab, presence of viral mutation(s) within the areas targeted by this assay, and inadequate number of viral copies(<138 copies/mL). A negative result  must be combined with clinical observations, patient history, and epidemiological information. The expected result is Negative.  Fact Sheet for Patients:  EntrepreneurPulse.com.au  Fact Sheet for Healthcare Providers:  IncredibleEmployment.be  This test is no t yet approved or cleared by the [place] FDA and  has been authorized for detection and/or diagnosis of SARS-CoV-2 by FDA under an Emergency Use Authorization (EUA). This EUA will remain  in effect (meaning this test can be used) for the duration of the COVID-19 declaration under Section 564(b)(1) of the Act, 21 U.S.C.section 360bbb-3(b)(1), unless the authorization is terminated  or revoked sooner.       Influenza A by PCR NEGATIVE NEGATIVE Final   Influenza B by PCR NEGATIVE NEGATIVE Final    Comment: (NOTE) The Xpert Xpress SARS-CoV-2/FLU/RSV plus assay is intended as an aid in the diagnosis of influenza from Nasopharyngeal swab specimens and should not be used as a sole basis for treatment. Nasal washings and aspirates are unacceptable for Xpert Xpress SARS-CoV-2/FLU/RSV testing.  Fact Sheet for Patients: EntrepreneurPulse.com.au  Fact Sheet for Healthcare Providers: IncredibleEmployment.be  This test is not yet approved or cleared by the [place] FDA and has been authorized for detection and/or diagnosis of SARS-CoV-2 by FDA under an Emergency Use Authorization (EUA). This EUA will remain in effect (meaning this test can be used) for the  duration of the COVID-19 declaration under Section 564(b)(1) of the Act, 21 U.S.C. section 360bbb-3(b)(1), unless the authorization is terminated or revoked.  Performed at [facility], [place], [place] [address]   C Difficile Quick Screen w PCR reflex     Status: Abnormal   Collection Time: 07/19/21 10:07 PM   Specimen: STOOL  Result Value Ref Range Status   C Diff antigen POSITIVE (A) NEGATIVE Final   C Diff toxin NEGATIVE NEGATIVE Final   C Diff interpretation Results are indeterminate. See PCR results.  Final    Comment: Performed at [facility], [address] [place], [place] [address]  Gastrointestinal Panel by PCR , Stool     Status: None   Collection Time: 07/19/21 10:07 PM   Specimen: STOOL  Result Value Ref Range Status   Campylobacter species NOT DETECTED NOT DETECTED Final   Plesimonas shigelloides NOT DETECTED NOT DETECTED Final   Salmonella species NOT DETECTED NOT DETECTED Final   Yersinia enterocolitica NOT DETECTED NOT DETECTED Final   Vibrio species NOT DETECTED NOT DETECTED Final   Vibrio cholerae NOT DETECTED NOT DETECTED Final   Enteroaggregative E coli (EAEC) NOT DETECTED NOT DETECTED Final   Enteropathogenic E coli (EPEC) NOT DETECTED NOT DETECTED Final   Enterotoxigenic E coli (ETEC) NOT DETECTED NOT DETECTED Final   Shiga like toxin producing E coli (STEC) NOT DETECTED NOT DETECTED Final   Shigella/Enteroinvasive E coli (EIEC) NOT DETECTED NOT DETECTED Final   Cryptosporidium NOT DETECTED NOT DETECTED Final   Cyclospora cayetanensis NOT DETECTED NOT DETECTED Final   Entamoeba histolytica NOT DETECTED NOT DETECTED Final   Giardia lamblia NOT DETECTED NOT DETECTED Final   Adenovirus F40/41 NOT DETECTED NOT DETECTED Final   Astrovirus NOT DETECTED NOT DETECTED Final   Norovirus GI/GII NOT DETECTED NOT DETECTED Final   Rotavirus A NOT DETECTED NOT DETECTED Final   Sapovirus (I, II, IV, and V) NOT DETECTED NOT DETECTED  Final    Comment: Performed at [facility], [address] [place], [place] [address]  C. Diff by PCR, Reflexed     Status: Abnormal  Collection Time: 07/19/21 10:07 PM  Result Value Ref Range Status   Toxigenic C. Difficile by PCR POSITIVE (A) NEGATIVE Final    Comment: Positive for toxigenic C. difficile with little to no toxin production. Only treat if clinical presentation suggests symptomatic illness. Performed at [facility], [address] [place], [place] [address]      Labs: BNP (last 3 results) No results for input(s): BNP in the last 8760 hours. Basic Metabolic Panel: Recent Labs  Lab 07/19/21 1225 07/19/21 2207 07/20/21 0727 07/21/21 0515  NA 136  --  135 137  K 4.0  --  3.5 3.3*  CL 106  --  105 109  CO2 24  --  23 25  GLUCOSE 123*  --  122* 109*  BUN 11  --  11 10  CREATININE 0.70  --  0.77 0.61  CALCIUM 10.1  --  9.7 9.4  MG  --  2.0  --   --   PHOS  --  2.8  --   --    Liver Function Tests: Recent Labs  Lab 07/19/21 1225  AST 25  ALT 28  ALKPHOS 104  BILITOT 0.8  PROT 6.9  ALBUMIN 3.9   No results for input(s): LIPASE, AMYLASE in the last 168 hours. No results for input(s): AMMONIA in the last 168 hours. CBC: Recent Labs  Lab 07/19/21 1225 07/19/21 1717 07/20/21 0727 07/21/21 0515  WBC 12.3*  --  9.9 8.7  HGB 15.1* 14.9 13.7 12.0  HCT 45.2 45.0 40.7 35.5*  MCV 91.9  --  92.3 92.7  PLT 235  --  195 178   Cardiac Enzymes: No results for input(s): CKTOTAL, CKMB, CKMBINDEX, TROPONINI in the last 168 hours. BNP: Invalid input(s): POCBNP CBG: No results for input(s): GLUCAP in the last 168 hours. D-Dimer No results for input(s): DDIMER in the last 72 hours. Hgb A1c No results for input(s): HGBA1C in the last 72 hours. Lipid Profile No results for input(s): CHOL, HDL, LDLCALC, TRIG, CHOLHDL, LDLDIRECT in the last 72 hours. Thyroid function studies Recent Labs    07/19/21 2207  TSH 1.535   Anemia work up No  results for input(s): VITAMINB12, FOLATE, FERRITIN, TIBC, IRON, RETICCTPCT in the last 72 hours. Urinalysis    Component Value Date/Time   COLORURINE AMBER (A) 05/19/2021 1425   APPEARANCEUR HAZY (A) 05/19/2021 1425   APPEARANCEUR Clear 01/11/2021 0910   LABSPEC 1.012 05/19/2021 1425   PHURINE 5.0 05/19/2021 1425   GLUCOSEU NEGATIVE 05/19/2021 1425   HGBUR MODERATE (A) 05/19/2021 1425   BILIRUBINUR NEGATIVE 05/19/2021 1425   BILIRUBINUR Negative 01/11/2021 0910   KETONESUR NEGATIVE 05/19/2021 1425   PROTEINUR 30 (A) 05/19/2021 1425   UROBILINOGEN 0.2 12/21/2020 1405   NITRITE POSITIVE (A) 05/19/2021 1425   LEUKOCYTESUR LARGE (A) 05/19/2021 1425   Sepsis Labs Invalid input(s): PROCALCITONIN,  WBC,  LACTICIDVEN Microbiology Recent Results (from the past 240 hour(s))  Blood culture (single)     Status: None (Preliminary result)   Collection Time: 07/19/21  5:16 PM   Specimen: BLOOD  Result Value Ref Range Status   Specimen Description BLOOD RIGHT ANTECUBITAL  Final   Special Requests   Final    BOTTLES DRAWN AEROBIC AND ANAEROBIC Blood Culture results may not be optimal due to an inadequate volume of blood received in culture bottles   Culture   Final    NO GROWTH 2 DAYS Performed at [facility], [address] [place], [place] [address]  Report Status PENDING  Incomplete  Resp Panel by RT-PCR (Flu A&B, Covid) Nasopharyngeal Swab     Status: None   Collection Time: 07/19/21  6:29 PM   Specimen: Nasopharyngeal Swab; Nasopharyngeal(NP) swabs in vial transport medium  Result Value Ref Range Status   SARS Coronavirus 2 by RT PCR NEGATIVE NEGATIVE Final    Comment: (NOTE) SARS-CoV-2 target nucleic acids are NOT DETECTED.  The SARS-CoV-2 RNA is generally detectable in upper respiratory specimens during the acute phase of infection. The lowest concentration of SARS-CoV-2 viral copies this assay can detect is 138 copies/mL. A negative result does not preclude  SARS-Cov-2 infection and should not be used as the sole basis for treatment or other patient management decisions. A negative result may occur with  improper specimen collection/handling, submission of specimen other than nasopharyngeal swab, presence of viral mutation(s) within the areas targeted by this assay, and inadequate number of viral copies(<138 copies/mL). A negative result must be combined with clinical observations, patient history, and epidemiological information. The expected result is Negative.  Fact Sheet for Patients:  EntrepreneurPulse.com.au  Fact Sheet for Healthcare Providers:  IncredibleEmployment.be  This test is no t yet approved or cleared by the [place] FDA and  has been authorized for detection and/or diagnosis of SARS-CoV-2 by FDA under an Emergency Use Authorization (EUA). This EUA will remain  in effect (meaning this test can be used) for the duration of the COVID-19 declaration under Section 564(b)(1) of the Act, 21 U.S.C.section 360bbb-3(b)(1), unless the authorization is terminated  or revoked sooner.       Influenza A by PCR NEGATIVE NEGATIVE Final   Influenza B by PCR NEGATIVE NEGATIVE Final    Comment: (NOTE) The Xpert Xpress SARS-CoV-2/FLU/RSV plus assay is intended as an aid in the diagnosis of influenza from Nasopharyngeal swab specimens and should not be used as a sole basis for treatment. Nasal washings and aspirates are unacceptable for Xpert Xpress SARS-CoV-2/FLU/RSV testing.  Fact Sheet for Patients: EntrepreneurPulse.com.au  Fact Sheet for Healthcare Providers: IncredibleEmployment.be  This test is not yet approved or cleared by the [place] FDA and has been authorized for detection and/or diagnosis of SARS-CoV-2 by FDA under an Emergency Use Authorization (EUA). This EUA will remain in effect (meaning this test can be used) for the duration of  the COVID-19 declaration under Section 564(b)(1) of the Act, 21 U.S.C. section 360bbb-3(b)(1), unless the authorization is terminated or revoked.  Performed at [facility], [place], [place] [address]   C Difficile Quick Screen w PCR reflex     Status: Abnormal   Collection Time: 07/19/21 10:07 PM   Specimen: STOOL  Result Value Ref Range Status   C Diff antigen POSITIVE (A) NEGATIVE Final   C Diff toxin NEGATIVE NEGATIVE Final   C Diff interpretation Results are indeterminate. See PCR results.  Final    Comment: Performed at [facility], [address] [place],  [address]  Gastrointestinal Panel by PCR , Stool     Status: None   Collection Time: 07/19/21 10:07 PM   Specimen: STOOL  Result Value Ref Range Status   Campylobacter species NOT DETECTED NOT DETECTED Final   Plesimonas shigelloides NOT DETECTED NOT DETECTED Final   Salmonella species NOT DETECTED NOT DETECTED Final   Yersinia enterocolitica NOT DETECTED NOT DETECTED Final   Vibrio species NOT DETECTED NOT DETECTED Final   Vibrio cholerae NOT DETECTED NOT DETECTED Final   Enteroaggregative E coli (EAEC) NOT DETECTED  NOT DETECTED Final   Enteropathogenic E coli (EPEC) NOT DETECTED NOT DETECTED Final   Enterotoxigenic E coli (ETEC) NOT DETECTED NOT DETECTED Final   Shiga like toxin producing E coli (STEC) NOT DETECTED NOT DETECTED Final   Shigella/Enteroinvasive E coli (EIEC) NOT DETECTED NOT DETECTED Final   Cryptosporidium NOT DETECTED NOT DETECTED Final   Cyclospora cayetanensis NOT DETECTED NOT DETECTED Final   Entamoeba histolytica NOT DETECTED NOT DETECTED Final   Giardia lamblia NOT DETECTED NOT DETECTED Final   Adenovirus F40/41 NOT DETECTED NOT DETECTED Final   Astrovirus NOT DETECTED NOT DETECTED Final   Norovirus GI/GII NOT DETECTED NOT DETECTED Final   Rotavirus A NOT DETECTED NOT DETECTED Final   Sapovirus (I, II, IV, and V) NOT DETECTED NOT DETECTED Final     Comment: Performed at [facility], [address] [place], [place] [address]  C. Diff by PCR, Reflexed     Status: Abnormal   Collection Time: 07/19/21 10:07 PM  Result Value Ref Range Status   Toxigenic C. Difficile by PCR POSITIVE (A) NEGATIVE Final    Comment: Positive for toxigenic C. difficile with little to no toxin production. Only treat if clinical presentation suggests symptomatic illness. Performed at [facility], [address], [place], [place] [address]      Time coordinating discharge: Over 30 minutes  SIGNED:   Wyvonnia Dusky, MD  [facility] 07/21/2021, 11:41 AM Pager   If 7PM-7AM, please contact night-coverage

## 2021-07-21 NOTE — TOC Benefit Eligibility Note (Signed)
Patient Teacher, English as a foreign language completed.    The patient is currently admitted and upon discharge could be taking Vancomycin 125 mg capsules.  The current 10 day co-pay is, $20.39.   The patient is insured through [employer], [place] Patient Advocate Specialist [place] Patient Advocate Team Direct Number: [phone]  Fax: [phone]

## 2021-07-25 ENCOUNTER — Telehealth: Payer: Self-pay

## 2021-07-25 LAB — CULTURE, BLOOD (SINGLE): Culture: NO GROWTH

## 2021-07-25 LAB — SURGICAL PATHOLOGY

## 2021-07-25 NOTE — Telephone Encounter (Signed)
Transition Care Management Follow-up Telephone Call Date of discharge and from where: 07/21/21 [facility] How have you been since you were released from the hospital? Pt states she is improving but bleeding has stopped; most discomfort is from a lot of gas Any questions or concerns? No  Items Reviewed: Did the pt receive and understand the discharge instructions provided? Yes  Medications obtained and verified? Yes  Other? No  Any new allergies since your discharge? No  Dietary orders reviewed? Yes Do you have support at home? Yes   Home Care and Equipment/Supplies: Were home health services ordered? no  Were any new equipment or medical supplies ordered?  No   Functional Questionnaire: (I = Independent and D = Dependent) ADLs: I  Bathing/Dressing- I  Meal Prep- I  Eating- I  Maintaining continence- I  Transferring/Ambulation- I  Managing Meds- I  Follow up appointments reviewed:  PCP Hospital f/u appt confirmed? Yes  Scheduled to see Dr. Ronnald Ramp on 07/28/21 @ 2:00. [facility] f/u appt confirmed? No  Awaiting appt with Dr. Haig Prophet Are transportation arrangements needed? No  If their condition worsens, is the pt aware to call PCP or go to the Emergency Dept.? Yes Was the patient provided with contact information for the PCP's office or ED? Yes Was to pt encouraged to call back with questions or concerns? Yes

## 2021-07-27 ENCOUNTER — Encounter: Payer: Self-pay | Admitting: Obstetrics and Gynecology

## 2021-07-27 ENCOUNTER — Other Ambulatory Visit: Payer: Self-pay | Admitting: Obstetrics and Gynecology

## 2021-07-27 MED ORDER — FLUCONAZOLE 150 MG PO TABS: 150.0000 mg | ORAL_TABLET | Freq: Once | ORAL | 0 refills | Status: AC

## 2021-07-27 MED ORDER — TERCONAZOLE 0.4 % VA CREA: 1.0000 | TOPICAL_CREAM | Freq: Every day | VAGINAL | 0 refills | Status: DC

## 2021-07-28 ENCOUNTER — Other Ambulatory Visit: Payer: Self-pay

## 2021-07-28 ENCOUNTER — Encounter: Payer: Self-pay | Admitting: Family Medicine

## 2021-07-28 ENCOUNTER — Ambulatory Visit (INDEPENDENT_AMBULATORY_CARE_PROVIDER_SITE_OTHER): Payer: Medicare Other | Admitting: Family Medicine

## 2021-07-28 VITALS — BP 138/80 | HR 80 | Ht 63.0 in | Wt 178.0 lb

## 2021-07-28 DIAGNOSIS — Z09 Encounter for follow-up examination after completed treatment for conditions other than malignant neoplasm: Secondary | ICD-10-CM

## 2021-07-28 DIAGNOSIS — K529 Noninfective gastroenteritis and colitis, unspecified: Secondary | ICD-10-CM

## 2021-07-28 DIAGNOSIS — I10 Essential (primary) hypertension: Secondary | ICD-10-CM | POA: Diagnosis not present

## 2021-07-28 DIAGNOSIS — E876 Hypokalemia: Secondary | ICD-10-CM

## 2021-07-28 DIAGNOSIS — K5790 Diverticulosis of intestine, part unspecified, without perforation or abscess without bleeding: Secondary | ICD-10-CM | POA: Diagnosis not present

## 2021-07-28 DIAGNOSIS — A0472 Enterocolitis due to Clostridium difficile, not specified as recurrent: Secondary | ICD-10-CM

## 2021-07-28 DIAGNOSIS — E871 Hypo-osmolality and hyponatremia: Secondary | ICD-10-CM | POA: Diagnosis not present

## 2021-07-28 DIAGNOSIS — K644 Residual hemorrhoidal skin tags: Secondary | ICD-10-CM

## 2021-07-28 MED ORDER — HYDROCORTISONE (PERIANAL) 2.5 % EX CREA: 1.0000 "application " | TOPICAL_CREAM | Freq: Two times a day (BID) | CUTANEOUS | 0 refills | Status: DC

## 2021-07-28 NOTE — Progress Notes (Signed)
Date:  07/28/2021   Name:  Carrie Roberson   DOB:  23-Apr-1946   MRN:  [id number]   Chief Complaint: Hospitalization Follow-up (Colitis- has follow up with GI March 1)  Patient is a 76 year old female who presents for a hospital follow-up exam. The patient reports the following problems: none at present. Health maintenance has been reviewed up to date.[facility] and was discharged on 07/21/2022. Transition of care call placed on 07/25/21.        Lab Results  Component Value Date   NA 137 07/21/2021   K 3.3 (L) 07/21/2021   CO2 25 07/21/2021   GLUCOSE 109 (H) 07/21/2021   BUN 10 07/21/2021   CREATININE 0.61 07/21/2021   CALCIUM 9.4 07/21/2021   GFRNONAA >60 07/21/2021   Lab Results  Component Value Date   CHOL 188 09/02/2017   HDL 53 09/02/2017   LDLCALC 109 (H) 09/02/2017   TRIG 131 09/02/2017   CHOLHDL 3.5 09/02/2017   Lab Results  Component Value Date   TSH 1.535 07/19/2021   No results found for: HGBA1C Lab Results  Component Value Date   WBC 8.7 07/21/2021   HGB 12.0 07/21/2021   HCT 35.5 (L) 07/21/2021   MCV 92.7 07/21/2021   PLT 178 07/21/2021   Lab Results  Component Value Date   ALT 28 07/19/2021   AST 25 07/19/2021   ALKPHOS 104 07/19/2021   BILITOT 0.8 07/19/2021   No results found for: 25OHVITD2, 25OHVITD3, VD25OH   Review of Systems  Constitutional:  Positive for fatigue. Negative for activity change, appetite change, chills, diaphoresis, fever and unexpected weight change.  HENT:  Negative for congestion, ear discharge, ear pain, rhinorrhea, sinus pressure, sneezing and sore throat.   Eyes:  Negative for photophobia, pain, discharge, redness and itching.  Respiratory:  Negative for cough, chest tightness, shortness of breath, wheezing and stridor.   Cardiovascular:  Negative for chest pain, palpitations and leg swelling.  Gastrointestinal:  Negative for  abdominal distention, abdominal pain, blood in stool, constipation, diarrhea, nausea, rectal pain and vomiting.  Endocrine: Negative for cold intolerance, heat intolerance, polydipsia, polyphagia and polyuria.  Genitourinary:  Negative for dysuria, flank pain, frequency, hematuria, menstrual problem, pelvic pain, urgency, vaginal bleeding and vaginal discharge.  Musculoskeletal:  Negative for arthralgias, back pain and myalgias.  Skin:  Negative for rash and wound.  Allergic/Immunologic: Negative for environmental allergies and food allergies.  Neurological:  Negative for dizziness, weakness, light-headedness, numbness and headaches.  Hematological:  Negative for adenopathy. Does not bruise/bleed easily.  Psychiatric/Behavioral:  Negative for dysphoric mood. The patient is not nervous/anxious.    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 10/13/2019   Iron deficiency 10/02/2019   Anxiety associated with cancer diagnosis ([place]) 10/02/2019   Chemotherapy-induced nausea 10/02/2019   Thyroid nodule 10/02/2019   Hyponatremia 10/02/2019   B12 deficiency 09/18/2019   Family history of breast cancer    Family history of leukemia    Goals of care, counseling/discussion 09/07/2019   Ovarian mass 08/19/2019   Status post total abdominal  hysterectomy and bilateral salpingo-oophorectomy (TAH-BSO) 08/19/2019   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HCC)    Cyst of ovary 07/15/2019   Anxiety 09/02/2017   DDD (degenerative disc disease), 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 No Did it involve sudden or severe rash/hives, skin peeling, or any reaction on the inside of your mouth or nose? Yes Did you need to seek medical attention at a hospital or doctor's office? Yes When did it last happen?       10 + years If all above answers are NO, may proceed with cephalosporin use.   Cefoxitin Rash   Chlorhexidine Rash    Chloraprep Scrub Teal   Estradiol Rash   Estropipate Rash   Olmesartan Medoxomil-Hctz Rash   Sertraline Rash        Sulfa Antibiotics Rash    Past Surgical History:  Procedure Laterality Date   ABDOMINAL HYSTERECTOMY     BREAST BIOPSY Left    needle bx/clip-neg   CERVICAL BIOPSY  W/ LOOP ELECTRODE EXCISION     CHOLECYSTECTOMY     Colonoscopoy  2007, 2008, 2011   COLONOSCOPY WITH PROPOFOL N/A 05/05/2018   Procedure: COLONOSCOPY WITH PROPOFOL;  Surgeon: Lin Landsman, MD;  Location[facility] ENDOSCOPY;  Service: Gastroenterology;  Laterality: N/A;   FLEXIBLE SIGMOIDOSCOPY N/A 07/20/2021   Procedure: FLEXIBLE SIGMOIDOSCOPY;  Surgeon: Lesly Rubenstein, MD;  Location: [facility] ENDOSCOPY;  Service: Endoscopy;  Laterality: N/A;   HYSTERECTOMY ABDOMINAL WITH SALPINGO-OOPHORECTOMY N/A 08/19/2019   Procedure: HYSTERECTOMY ABDOMINAL WITH SALPINGO-OOPHORECTOMY WITH PELVIC WASHINGS PERITONEAL BIOPSIES AND PARA AORTIC LYMPH NODE DISSECTION;  Surgeon: [name], MD;  Location: [facility] ORS;  Service: Gynecology;  Laterality: N/A;    LEEP     OMENTECTOMY N/A 08/19/2019   Procedure: OMENTECTOMY;  Surgeon: [name], MD;  Location: [facility] ORS;  Service: Gynecology;  Laterality: N/A;   OOPHORECTOMY     PORTA CATH INSERTION N/A 09/11/2019   Procedure: PORTA CATH INSERTION;  Surgeon: Algernon Huxley, MD;  Location: [place] CV LAB;  Service: Cardiovascular;  Laterality: N/A;   TUBAL LIGATION  1999    Social History   Tobacco Use   Smoking status: Never   Smokeless tobacco: Never   Tobacco comments:    2bd hand smoke from father  Vaping Use   Vaping Use: Never used  Substance Use Topics   Alcohol use: Yes    Comment: rarely   Drug use: No     Medication list has been reviewed and updated.  Current Meds  Medication Sig   acetaminophen (TYLENOL) 500 MG tablet Take 1,000 mg by mouth every 6 (six) hours as needed (for pain.).   fluticasone (FLONASE) 50 MCG/ACT nasal spray Place 1 spray into both nostrils daily. otc   lisinopril (ZESTRIL) 5 MG tablet Take 1 tablet (5 mg total) by mouth daily.   loratadine (CLARITIN) 10 MG tablet Take 10  mg by mouth daily. otc   LORazepam (ATIVAN) 0.5 MG tablet TAKE 1 TABLET (0.5 MG TOTAL) BY MOUTH EVERY 6 (SIX) HOURS AS NEEDED (NAUSEA/VOMITING OR ANXIETY).   mirabegron ER (MYRBETRIQ) 50 MG TB24 tablet Take 1 tablet (50 mg total) by mouth daily.   pregabalin (LYRICA) 75 MG capsule Take 1 capsule (75 mg total) by mouth 2 (two) times daily.   terconazole (TERAZOL 7) 0.4 % vaginal cream Place 1 applicator vaginally at bedtime.   vancomycin (VANCOCIN) 125 MG capsule Take 1 capsule (125 mg total) by mouth 4 (four) times daily for 9 days.    PHQ 2/9 Scores 07/28/2021 03/30/2021 08/11/2020 03/29/2020  PHQ - 2 Score 0 0 0 2  PHQ- 9 Score 0 3 0 3    GAD 7 : Generalized Anxiety Score 03/30/2021 03/29/2020 02/23/2020 03/23/2019  Nervous, Anxious, on Edge 0 1 1 1   Control/stop worrying 1 0 0 0  Worry too much - different things 0 0 0 2  Trouble relaxing 1 0 0 0  Restless 0 0 0 0  Easily  annoyed or irritable 0 1 0 0  Afraid - awful might happen 0 0 0 1  Total GAD 7 Score 2 2 1 4   Anxiety Difficulty Not difficult at all Not difficult at all Not difficult at all Somewhat difficult    BP Readings from Last 3 Encounters:  07/28/21 138/80  07/21/21 (!) 145/70  07/04/21 138/75    Physical Exam Vitals and nursing note reviewed.  Constitutional:      Appearance: She is well-developed.  HENT:     Head: Normocephalic.     Right Ear: Tympanic membrane, ear canal and external ear normal.     Left Ear: Tympanic membrane, ear canal and external ear normal.     Nose: Nose normal. No congestion or rhinorrhea.  Eyes:     General: Lids are everted, no foreign bodies appreciated. No scleral icterus.       Left eye: No foreign body or hordeolum.     Conjunctiva/sclera: Conjunctivae normal.     Right eye: Right conjunctiva is not injected.     Left eye: Left conjunctiva is not injected.     Pupils: Pupils are equal, round, and reactive to light.  Neck:     Thyroid: No thyromegaly.     Vascular: No JVD.     Trachea: No tracheal deviation.  Cardiovascular:     Rate and Rhythm: Normal rate and regular rhythm.     Heart sounds: Normal heart sounds. No murmur heard.   No friction rub. No gallop.  Pulmonary:     Effort: Pulmonary effort is normal. No respiratory distress.     Breath sounds: Normal breath sounds. No wheezing, rhonchi or rales.  Abdominal:     General: Bowel sounds are normal.     Palpations: Abdomen is soft. There is no mass.     Tenderness: There is no abdominal tenderness. There is no guarding or rebound.  Musculoskeletal:        General: No tenderness. Normal range of motion.     Cervical back: Normal range of motion and neck supple.  Lymphadenopathy:     Cervical: No cervical adenopathy.  Skin:    General: Skin is warm.     Findings: No rash.  Neurological:     Mental Status: She is alert and oriented to person, place, and time.     Cranial Nerves: No  cranial nerve deficit.  Deep Tendon Reflexes: Reflexes normal.  Psychiatric:        Mood and Affect: Mood is not anxious or depressed.    Wt Readings from Last 3 Encounters:  07/28/21 178 lb (80.7 kg)  07/19/21 180 lb (81.6 kg)  07/04/21 178 lb (80.7 kg)    BP 138/80    Pulse 80    Ht 5\' 3"  (1.6 m)    Wt 178 lb (80.7 kg)    BMI 31.53 kg/m   Assessment and Plan:  1. Hospital discharge follow-up Patient presents with follow-up from hospital discharge from [facility] hospital for ischemic colitis and C. difficile.  Patient is currently recuperating from that and is gradually improving.  2. External hemorrhoid Patient has had an external hemorrhoid that has been bothersome and we will treat with Anusol HC 2.5 cream - hydrocortisone (ANUSOL-HC) 2.5 % rectal cream; Place 1 application rectally 2 (two) times daily.  Dispense: 30 g; Refill: 0  3. Essential hypertension Chronic.  Controlled.  Stable.  Current blood pressure is 138/80.  We will continue on current medical regimen.  4. Colitis Patient is followed by Dr. Haig Prophet gastroenterology  5. C. difficile colitis Patient is followed by gastroenterology Dr. Haig Prophet  6. Diverticulosis Discussed that the course of diverticulosis and when patient is not having any tenderness to suggest any inflammation and have reviewed avoidance of certain foods that could aggravate her diverticulosis.  7. Hyponatremia History of hyponatremia as well as hypokalemia we will check renal function panel and address accordingly. - Renal function panel  8. Hypokalemia As noted above - Renal function panel

## 2021-07-29 ENCOUNTER — Encounter: Payer: Self-pay | Admitting: Oncology

## 2021-07-29 LAB — RENAL FUNCTION PANEL
Albumin: 4.2 g/dL (ref 3.7–4.7)
BUN/Creatinine Ratio: 19 (ref 12–28)
BUN: 13 mg/dL (ref 8–27)
CO2: 23 mmol/L (ref 20–29)
Calcium: 10.7 mg/dL — ABNORMAL HIGH (ref 8.7–10.3)
Chloride: 103 mmol/L (ref 96–106)
Creatinine, Ser: 0.68 mg/dL (ref 0.57–1.00)
Glucose: 88 mg/dL (ref 70–99)
Phosphorus: 3.2 mg/dL (ref 3.0–4.3)
Potassium: 4.7 mmol/L (ref 3.5–5.2)
Sodium: 140 mmol/L (ref 134–144)
eGFR: 90 mL/min/{1.73_m2} (ref >59)

## 2021-08-04 ENCOUNTER — Other Ambulatory Visit: Payer: Self-pay

## 2021-08-04 ENCOUNTER — Ambulatory Visit (INDEPENDENT_AMBULATORY_CARE_PROVIDER_SITE_OTHER): Payer: Medicare Other | Admitting: Obstetrics and Gynecology

## 2021-08-04 ENCOUNTER — Encounter: Payer: Self-pay | Admitting: Obstetrics and Gynecology

## 2021-08-04 VITALS — BP 132/78 | Ht 63.0 in | Wt 179.0 lb

## 2021-08-04 DIAGNOSIS — R300 Dysuria: Secondary | ICD-10-CM

## 2021-08-04 DIAGNOSIS — C561 Malignant neoplasm of right ovary: Secondary | ICD-10-CM

## 2021-08-04 MED ORDER — LORAZEPAM 0.5 MG PO TABS: 0.5000 mg | ORAL_TABLET | Freq: Four times a day (QID) | ORAL | 0 refills | Status: DC | PRN

## 2021-08-04 NOTE — Progress Notes (Signed)
Obstetrics & Gynecology Office Visit   Chief Complaint:  Chief Complaint  Patient presents with   Follow-up    6 month follow up - no concerns. RM 4    History of Present Illness: 76 y.o. 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  Her initial staging procedure was conducted 08/19/2019 with no disease noted outside of the right ovary.  She completed 6 cycles of carbo taxol.  Postoperative surveillance with CA-125 has not shown any evidence of recurrence of disease (8.4 U/mL on 05/19/21).  She was recently admitted for C-dif colitis and underwent CT A/P on 07/19/21 which also showed no disease recurrence.  She has noted improvement in symptoms since hospitalization but feels bowl movement have not fully regulated just yet.  She also is worried about a possible UTI.    Otherwise she denies night sweats, abdominal pain, bloating, weight gain/loss, or vaginal bleeding.  Review of Systems: 10 point review of systems negative unless noted in HPI   Past Medical History:  Past Medical History:  Diagnosis Date   Anxiety    Arthritis    Diverticulitis    Family history of breast cancer    Family history of leukemia    GERD (gastroesophageal reflux disease)    History of ischemic colitis 09/02/2017   Hx of colonic polyp    Hypertension    H/O-PT TAKEN OFF BY PCP AS OF 2019   IBS (irritable bowel syndrome)    Ovarian cancer ([place])    Ovarian cyst    Personal history of chemotherapy 2021   6 treatments   Vaginal atrophy     Past Surgical History:  Past Surgical History:  Procedure Laterality Date   ABDOMINAL HYSTERECTOMY     BREAST BIOPSY Left    needle bx/clip-neg   CERVICAL BIOPSY  W/ LOOP ELECTRODE EXCISION     CHOLECYSTECTOMY     Colonoscopoy  2007, 2008, 2011   COLONOSCOPY WITH PROPOFOL N/A 05/05/2018   Procedure: COLONOSCOPY WITH PROPOFOL;  Surgeon: Lin Landsman, MD;  Location: [facility] ENDOSCOPY;  Service:  Gastroenterology;  Laterality: N/A;   FLEXIBLE SIGMOIDOSCOPY N/A 07/20/2021   Procedure: FLEXIBLE SIGMOIDOSCOPY;  Surgeon: Lesly Rubenstein, MD;  Location: [facility] ENDOSCOPY;  Service: Endoscopy;  Laterality: N/A;   HYSTERECTOMY ABDOMINAL WITH SALPINGO-OOPHORECTOMY N/A 08/19/2019   Procedure: HYSTERECTOMY ABDOMINAL WITH SALPINGO-OOPHORECTOMY WITH PELVIC WASHINGS PERITONEAL BIOPSIES AND PARA AORTIC LYMPH NODE DISSECTION;  Surgeon: [name], MD;  Location: [facility] ORS;  Service: Gynecology;  Laterality: N/A;   LEEP     OMENTECTOMY N/A 08/19/2019   Procedure: OMENTECTOMY;  Surgeon: [name], MD;  Location: [facility] ORS;  Service: Gynecology;  Laterality: N/A;   OOPHORECTOMY     PORTA CATH INSERTION N/A 09/11/2019   Procedure: PORTA CATH INSERTION;  Surgeon: Algernon Huxley, MD;  Location: [place] CV LAB;  Service: Cardiovascular;  Laterality: N/A;   TUBAL LIGATION  1999    Gynecologic History: No LMP recorded. Patient has had a hysterectomy.  Obstetric History: G0P0000  Family History:  Family History  Problem Relation Age of Onset   Heart failure Mother    Atrial fibrillation Mother    Hypertension Mother    Diabetes Father    Heart attack Father    Breast cancer Maternal Aunt 15   Breast cancer Maternal Grandmother 79   Breast cancer Cousin        2 mat cousins   Hypertension  Sister    Diabetes Sister    Blindness Sister    Hypertension Sister    Arthritis Sister    Leukemia Paternal Aunt    Leukemia Paternal Uncle    Brain cancer Paternal Aunt    Other Brother 26       Drowning accident   Liver cancer Maternal Uncle    Alcohol abuse Maternal Uncle    Prostate cancer Neg Hx    Kidney cancer Neg Hx    BRCA 1/2 Neg Hx     Social History:  Social History   Socioeconomic History   Marital status: Single    Spouse name: Not on file   Number of children: 0   Years of education: Not on file   Highest education level: Associate degree: academic program   Occupational History   Occupation: Retired  Tobacco Use   Smoking status: Never   Smokeless tobacco: Never   Tobacco comments:    2bd hand smoke from father  Vaping Use   Vaping Use: Never used  Substance and Sexual Activity   Alcohol use: Yes    Comment: rarely   Drug use: No   Sexual activity: Not Currently    Birth control/protection: Post-menopausal  Other Topics Concern   Not on file  Social History Narrative   Not on file   Social Determinants of Health   Financial Resource Strain: Not on file  Food Insecurity: Not on file  Transportation Needs: Not on file  Physical Activity: Not on file  Stress: Not on file  Social Connections: Not on file  Intimate Partner Violence: Not on file    Allergies:  Allergies  Allergen Reactions   Citalopram Nausea And Vomiting   Ivp Dye [Iodinated Contrast Media] Hives and Swelling    hives   Meloxicam     unknown   Adhesive [Tape] Rash   Amlodipine Rash   Ampicillin Rash    Did it involve swelling of the face/tongue/throat, SOB, or low BP? No Did it involve sudden or severe rash/hives, skin peeling, or any reaction on the inside of your mouth or nose? Yes Did you need to seek medical attention at a hospital or doctor's office? Yes When did it last happen?       10 + years If all above answers are NO, may proceed with cephalosporin use.   Cefoxitin Rash   Chlorhexidine Rash    Chloraprep Scrub Teal   Estradiol Rash   Estropipate Rash   Olmesartan Medoxomil-Hctz Rash   Sertraline Rash        Sulfa Antibiotics Rash    Medications: Prior to Admission medications   Medication Sig Start Date End Date Taking? Authorizing Provider  acetaminophen (TYLENOL) 500 MG tablet Take 1,000 mg by mouth every 6 (six) hours as needed (for pain.).    [provider]  Cranberry 300 MG tablet Take 300 mg by mouth 2 (two) times daily. Patient not taking: Reported on 07/28/2021    [provider]  fluticasone (FLONASE)  50 MCG/ACT nasal spray Place 1 spray into both nostrils daily. otc    [provider]  hydrocortisone (ANUSOL-HC) 2.5 % rectal cream Place 1 application rectally 2 (two) times daily. 07/28/21   Juline Patch, MD  lisinopril (ZESTRIL) 5 MG tablet Take 1 tablet (5 mg total) by mouth daily. 05/02/21   Juline Patch, MD  loratadine (CLARITIN) 10 MG tablet Take 10 mg by mouth daily. otc    [provider]  LORazepam (ATIVAN) 0.5 MG tablet Take 1 tablet (0.5 mg total) by mouth every 6 (six) hours as needed (Nausea/vomiting or anxiety). 08/04/21   [name], MD  mirabegron ER (MYRBETRIQ) 50 MG TB24 tablet Take 1 tablet (50 mg total) by mouth daily. 07/04/21   Vaillancourt, Aldona Bar, PA-C  pregabalin (LYRICA) 75 MG capsule Take 1 capsule (75 mg total) by mouth 2 (two) times daily. 01/10/21   Sindy Guadeloupe, MD  terconazole (TERAZOL 7) 0.4 % vaginal cream Place 1 applicator vaginally at bedtime. 07/27/21   [name], MD    Physical Exam Vitals:  Vitals:   08/04/21 1110  BP: 132/78   No LMP recorded. Patient has had a hysterectomy.  General: NAD HEENT: normocephalic, anicteric Pulmonary: No increased work of breathing Genitourinary:  External: Normal external female genitalia.  Normal urethral meatus, normal  Bartholin's and Skene's glands.    Vagina: Normal vaginal mucosa, no evidence of prolapse.    Cervix: surgically absent  Uterus: surgically absent  Adnexa: surgically absent  Rectal: deferred  Lymphatic: no evidence of inguinal lymphadenopathy Extremities: no edema, erythema, or tenderness Neurologic: Grossly intact Psychiatric: mood appropriate, affect full  Female chaperone present for pelvic  portions of the physical exam  Assessment: 76 y.o. G0P0000 surveillance exam history of ovarian cancer,   Plan: Problem List Items Addressed This Visit       Endocrine   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HCC)   Relevant Medications   LORazepam (ATIVAN)  0.5 MG tablet   Other Visit Diagnoses     Dysuria    -  Primary   Relevant Orders   Urine Culture      1) Ovarian cancer - no evidence of recurrence by CA-125, imaging, or exam.    2) Anxiety - refill Ativan Rx  3) Dysuria - UCx sent  4) Return in about 1 year (around 08/04/2022) for annual.   [name], MD, [place] 08/04/2021, 4:23 PM

## 2021-08-04 NOTE — Patient Instructions (Signed)
[facility] [address] [place], [place] [address] [phone]

## 2021-08-06 ENCOUNTER — Encounter: Payer: Self-pay | Admitting: Obstetrics and Gynecology

## 2021-08-06 LAB — URINE CULTURE

## 2021-08-21 ENCOUNTER — Inpatient Hospital Stay: Payer: Medicare Other | Attending: Oncology

## 2021-08-21 ENCOUNTER — Other Ambulatory Visit: Payer: Self-pay

## 2021-08-21 DIAGNOSIS — C561 Malignant neoplasm of right ovary: Secondary | ICD-10-CM | POA: Insufficient documentation

## 2021-08-21 DIAGNOSIS — Z95828 Presence of other vascular implants and grafts: Secondary | ICD-10-CM

## 2021-08-21 MED ORDER — HEPARIN SOD (PORK) LOCK FLUSH 100 UNIT/ML IV SOLN
INTRAVENOUS | Status: AC
  Filled 2021-08-21: qty 5

## 2021-08-21 MED ORDER — HEPARIN SOD (PORK) LOCK FLUSH 100 UNIT/ML IV SOLN
500.0000 [IU] | Freq: Once | INTRAVENOUS | Status: AC
  Administered 2021-08-21: 500 [IU] via INTRAVENOUS
  Filled 2021-08-21: qty 5

## 2021-08-21 MED ORDER — SODIUM CHLORIDE 0.9% FLUSH
10.0000 mL | Freq: Once | INTRAVENOUS | Status: AC
  Administered 2021-08-21: 10 mL via INTRAVENOUS
  Filled 2021-08-21: qty 10

## 2021-09-11 ENCOUNTER — Telehealth: Payer: Self-pay

## 2021-09-11 DIAGNOSIS — F411 Generalized anxiety disorder: Secondary | ICD-10-CM

## 2021-09-11 NOTE — Telephone Encounter (Signed)
Received call from Carrie Roberson. She feels like at this time she could use some counseling. Her sister is not currently with her as her son had a MI and underwent a triple bypass. [name] is planning on going and spending the next two weeks with her. She is feeling more anxious about her previous cancer diagnosis and felt like she had a good handle on it until recently. She has a recent breast biopsy that was benign and a recent hospitalization for rectal bleeding. Now with every ache and pain she thinks about having cancer and is crying daily. Offered referral to counseling which she happily excepted. Referral sent.

## 2021-09-13 ENCOUNTER — Encounter: Payer: Self-pay | Admitting: Licensed Clinical Social Worker

## 2021-09-13 NOTE — Progress Notes (Signed)
[place] Clinical Social Work  Initial Assessment   Carrie Roberson is a 76 y.o. year old female contacted by phone. Clinical Social Work was referred by nurse navigator for assessment of psychosocial needs.   SDOH (Social Determinants of Health) assessments performed: Yes SDOH Interventions    Flowsheet Row Most Recent Value  SDOH Interventions   Food Insecurity Interventions Intervention Not Indicated  Financial Strain Interventions Intervention Not Indicated  Housing Interventions Intervention Not Indicated  Intimate Partner Violence Interventions Intervention Not Indicated  Physical Activity Interventions [employer]  Stress Interventions Provide Counseling  Social Connections Interventions [employer], Other (Comment)  [Referred to Therapist]  Transportation Interventions Intervention Not Indicated       Distress Screen completed: No ONCBCN DISTRESS SCREENING 10/27/2019  Screening Type Initial Screening  Distress experienced in past week (1-10) 0  Information Concerns Type -      Family/Social Information:  Housing Arrangement: patient lives with sister , Carrie Roberson [phone] Family members/support persons in your life? Family Transportation concerns: no  Employment: Retired. Income source: Paediatric nurse concerns: No Type of concern: None Food access concerns: no Religious or spiritual practice: no Services Currently in place:  Medicare  Coping/ Adjustment to diagnosis: Patient understands treatment plan and what happens next? Patient is post-treatment  Concerns about diagnosis and/or treatment: Feelings of anger or sadness and Afraid of cancer Patient reported stressors: Depression, Anxiety, Adjusting to my illness, Isolation/ feeling alone, Feeling hopeless, Facing my mortality, Loss of sense of purpose, and relationship with sister Hopes and priorities: To find purpose again Patient enjoys crafts Current coping skills/ strengths: Capable of  independent living  and Financial means     SUMMARY: Current SDOH Barriers:  Mental Health Concerns  and Social Isolation  Clinical Social Work Clinical Goal(s):  patient will work with SW to address concerns related to post-treatment anxiety and depression.  CSW sent patient an email to [email or web address], with information on local therapist and psychiatrist and support groups.  Interventions: Discussed common feeling and emotions when being diagnosed with cancer, and the importance of support during treatment Informed patient of the support team roles and support services at [facility] Provided CSW contact information and encouraged patient to call with any questions or concerns Referred patient to psychiatrist and therapist and Provided patient with information about CSW role in healthcare and resources available.   Follow Up Plan: CSW will follow-up with patient by phone  Patient verbalizes understanding of plan: Yes  Patient is expressing symptoms of depression and anxiety post -treatment and is concern about current self-imposed isolation. CSW spoke with patient and provided supportive counseling.  CSW provided list of therapists and psychiatrists in the area along with resources.  Carrie Roberson , LCSW

## 2021-09-21 ENCOUNTER — Encounter: Payer: Self-pay | Admitting: Licensed Clinical Social Worker

## 2021-09-21 NOTE — Progress Notes (Signed)
[place] CSW Progress Note ? ?Clinical Social Worker  spoke with patient  to follow-up on prior conversation.  Patient stated she is ready for one-on-one counseling and scheduled an appointment with CSW on 09/27/2021 @ 2:00pm. Patient stated she is also considering making an appointment with a psychiatrist to discuss medication options to assist with symptoms. ? ? ?Tommy Minichiello , LCSW ?

## 2021-09-27 ENCOUNTER — Encounter: Payer: Self-pay | Admitting: Licensed Clinical Social Worker

## 2021-09-27 ENCOUNTER — Other Ambulatory Visit: Payer: Self-pay

## 2021-09-27 ENCOUNTER — Inpatient Hospital Stay: Payer: Medicare Other | Attending: Oncology | Admitting: Licensed Clinical Social Worker

## 2021-09-27 DIAGNOSIS — C561 Malignant neoplasm of right ovary: Secondary | ICD-10-CM

## 2021-09-28 ENCOUNTER — Ambulatory Visit: Payer: Medicare Other | Admitting: Family Medicine

## 2021-09-28 ENCOUNTER — Encounter: Payer: Self-pay | Admitting: Oncology

## 2021-09-28 NOTE — Progress Notes (Signed)
[place] CSW Progress Note ? ?Clinical Social Worker met with patient to discuss adjustment difficulty and concerns.  Patient stated she is still having difficulty with adjusting to illness and specifically survivors guilt.  Patient recently completed treatment and stated she is also experiencing increase in anxiety whenever she needs to return for a scan, which she currently needs to do every four months. Patient discussed her relationship with her family,specifically her sister, who lives with her. Patient she often feels overwhelmed with ruminating thoughts about her health and stated most of her anxiety stems from fear that any pain or discomfort she physically feels is the cancer returning. CSW and patient discussed ways to adjust thought process from fear to mindfulness.  CSW and patient discussed ways to help with anxiety and heightening feelings of guilt.  CSW requested patient make a list every night with two columns of what she can control and what she can't control and we would discuss this when she returns next week 10/04/2021 at 2:00PM ? ? ? ?Phillip Maffei , LCSW ?

## 2021-10-02 ENCOUNTER — Telehealth: Payer: Self-pay

## 2021-10-02 DIAGNOSIS — F3289 Other specified depressive episodes: Secondary | ICD-10-CM

## 2021-10-02 DIAGNOSIS — F411 Generalized anxiety disorder: Secondary | ICD-10-CM

## 2021-10-02 DIAGNOSIS — C801 Malignant (primary) neoplasm, unspecified: Secondary | ICD-10-CM

## 2021-10-02 NOTE — Telephone Encounter (Signed)
Spoke to Carrie Roberson following meeting with LCSW. She has requested referral for psychiatry be sent to [facility] and prefers a female provider. Referral obtained and entered. ?

## 2021-10-04 ENCOUNTER — Inpatient Hospital Stay: Payer: Medicare Other | Admitting: Licensed Clinical Social Worker

## 2021-10-04 ENCOUNTER — Other Ambulatory Visit: Payer: Self-pay

## 2021-10-04 ENCOUNTER — Encounter: Payer: Self-pay | Admitting: Licensed Clinical Social Worker

## 2021-10-04 DIAGNOSIS — C561 Malignant neoplasm of right ovary: Secondary | ICD-10-CM

## 2021-10-04 NOTE — Progress Notes (Signed)
[place] CSW Progress Note ? ?Clinical Social Worker met with patient to discuss adjustment issues due to diagnosis.  CSW and patient discussed different strategies to assist with increasing anxiety which the patient is.  Patient spoke about survivors guilt and different ways to manage the guilt and finding ways to identify the core causes of the survivors guilt and she can manage the anxiety she experiences due to the survivors guilt.  CSW and patient discussed CBT and how it can assist changing thoughts to affect feelings and actions.  CSW and patient discussed fear of death and how to manage the anxiety is causes but practicing acceptance and mindfulness.  Patient schedule counseling with CSW for next week. ? ? ? ?Cici Rodriges , LCSW ?

## 2021-10-08 ENCOUNTER — Other Ambulatory Visit: Payer: Self-pay

## 2021-10-08 ENCOUNTER — Emergency Department
Admission: EM | Admit: 2021-10-08 | Discharge: 2021-10-08 | Disposition: A | Discharge status: Home / Self Care | Payer: Medicare Other | Attending: Emergency Medicine | Admitting: Emergency Medicine

## 2021-10-08 DIAGNOSIS — T443X5A Adverse effect of other parasympatholytics [anticholinergics and antimuscarinics] and spasmolytics, initial encounter: Secondary | ICD-10-CM | POA: Diagnosis not present

## 2021-10-08 DIAGNOSIS — I10 Essential (primary) hypertension: Secondary | ICD-10-CM | POA: Diagnosis not present

## 2021-10-08 DIAGNOSIS — R682 Dry mouth, unspecified: Secondary | ICD-10-CM | POA: Diagnosis not present

## 2021-10-08 DIAGNOSIS — T887XXA Unspecified adverse effect of drug or medicament, initial encounter: Secondary | ICD-10-CM | POA: Insufficient documentation

## 2021-10-08 NOTE — ED Notes (Signed)
See triage note  presents with concerns of taking her Myrbetriq   states she took it last pm and again by mistake this am   denies any sx's  but is concerned about b/p ?

## 2021-10-08 NOTE — ED Provider Notes (Addendum)
? ?  [facility] ?Provider Note ? ? ? Event Date/Time  ? First MD Initiated Contact with Patient 10/08/21 [phone]   ?  (approximate) ? ? ?History  ? ?too many meds ? ? ?HPI ? ?Carrie Roberson is a 76 y.o. female with a history of chronic idiopathic urticaria, HTN, OAB and anxiety presents to the ER today reporting that she accidentally took 2 Myrbetriq in less than an 8-hour period.  She reports she went to sleep around 10 PM last night.  Prior to bed she took 2 Tylenol, 1 Benadryl and her Myrbetriq 50 mg.  She reports she woke up this morning and accidentally took another Myrbetriq 50 mg daily.  She reports she has dry mouth but denies dizziness, vision changes, dry eyes or urinary retention.   ? ? ?Physical Exam  ? ?Triage Vital Signs: ?ED Triage Vitals [10/08/21 0707]  ?Enc Vitals Group  ?   BP (!) 150/84  ?   Pulse Rate 75  ?   Resp 18  ?   Temp 98 ?F (36.7 ?C)  ?   Temp Source Oral  ?   SpO2 95 %  ?   Weight 169 lb (76.7 kg)  ?   Height '5\' 2"'$  (1.575 m)  ?   Head Circumference   ?   Peak Flow   ?   Pain Score 0  ?   Pain Loc   ?   Pain Edu?   ?   Excl. in [place]?   ? ? ?Most recent vital signs: ?Vitals:  ? 10/08/21 0707  ?BP: (!) 150/84  ?Pulse: 75  ?Resp: 18  ?Temp: 98 ?F (36.7 ?C)  ?SpO2: 95%  ? ? ? ?General: Awake, no distress.  ?ENT:  EOMs intact, oral mucosa pink and moist. ?CV:  RRR, no murmur noted. ?Resp:  Normal effort.  ?Abd:  Soft and nontender.  No distention. ?Psych:   Anxious appearing ? ? ? ?ED Results / Procedures / Treatments  ? ? ?MEDICATIONS ORDERED IN ED: ?Medications - No data to display ? ? ?IMPRESSION / MDM / ASSESSMENT AND PLAN / ED COURSE  ?I reviewed the triage vital signs and the nursing notes. ?             ?Dry Mouth secondary to Accidental Anticholinergic Ingestion: ? ?Her BP is slightly elevated, but not concerning and she is anxious appearing which is likely contributing to her elevated blood pressure. ?Advised her to push fluids throughout the day ?Advised her not to take  Benadryl or her Myrbetriq this evening, ok to resume tomorrow evening ?Advised her if she does not urinate in the next 12 hours, she may want to be reevaluated- however she was able to urinate prior to discharge ?She will follow up with her PCP as an outpatient ?  ? ? ?FINAL CLINICAL IMPRESSION(S) / ED DIAGNOSES  ? ?Final diagnoses:  ?Dry mouth  ?Adverse effect of anticholinergic  ? ? ? ?Rx / DC Orders  ? ?ED Discharge Orders   ? ? None  ? ?  ? ? ? ?Note:  This document was prepared using Dragon voice recognition software and may include unintentional dictation errors. ? ?  ?Jearld Fenton, NP ?10/08/21 5462 ? ?  ?Jearld Fenton, NP ?10/08/21 7035 ? ?  ?Naaman Plummer, MD ?10/09/21 [phone] ? ?

## 2021-10-08 NOTE — ED Triage Notes (Signed)
Pt comes pov after taking her night time meds (2 tylenol, 1 benadryl, and 1 ('50mg'$ ) myrbetriq) then this morning accidentally took another dose of her '50mg'$  myrbetriq. Pt was concerned for her bp after reading side effects on the internet. NAD.  ?

## 2021-10-08 NOTE — Discharge Instructions (Signed)
You were seen today for accidental ingestion of an extra Myrbetriq.  Your exam is normal.  You may experience dry eyes, dry mouth, dry skin and urinary retention.  If you do not urinate within 12 hours, you may want to be reevaluated.  Push fluids throughout the day.  You may follow-up with your PCP if you have any further concerns. ?

## 2021-10-11 ENCOUNTER — Inpatient Hospital Stay: Payer: Medicare Other | Admitting: Licensed Clinical Social Worker

## 2021-10-11 ENCOUNTER — Other Ambulatory Visit: Payer: Self-pay

## 2021-10-11 ENCOUNTER — Encounter: Payer: Self-pay | Admitting: Licensed Clinical Social Worker

## 2021-10-11 DIAGNOSIS — C561 Malignant neoplasm of right ovary: Secondary | ICD-10-CM

## 2021-10-11 NOTE — Progress Notes (Signed)
[place] CSW Progress Note ? ?Clinical Social Worker met with patient to discuss adjustment concerns and anxiety symptoms. Patient stated her anxiety has been less persistent since using coping strategies we discussed last week.  Patient stated openly stating her fears has helped her think about death and cancer without the thoughts creating a panic response.  CSW and patient spoke about different ways to reduce the anxiety she is still experiencing and how to engage her sister in the process with open communication about fears and concerns.  CSW and patient dicussed concerns about starting projects and identifying the reasons that may cause her hesitancy with starting projects.  CSW and patient discussed patient's ongoing survivor's guilt, and how she has not thought about it since she started using the coping strategies to identify her fear of death and cancer. CSW encouraged patient to pick a small project to begin and use the coping strategies to identify thoughts and feelings that may stop her from finishing the project.  Patient schedule counseling appointment for April 10th at 10:30AM ? ? ? ?Hazim Treadway , LCSW ?

## 2021-10-27 ENCOUNTER — Ambulatory Visit (INDEPENDENT_AMBULATORY_CARE_PROVIDER_SITE_OTHER): Payer: Medicare Other | Admitting: Family Medicine

## 2021-10-27 ENCOUNTER — Encounter: Payer: Self-pay | Admitting: Family Medicine

## 2021-10-27 VITALS — BP 144/100 | HR 88 | Ht 62.0 in | Wt 170.0 lb

## 2021-10-27 DIAGNOSIS — N3090 Cystitis, unspecified without hematuria: Secondary | ICD-10-CM

## 2021-10-27 LAB — POCT URINALYSIS DIPSTICK
Bilirubin, UA: NEGATIVE
Glucose, UA: NEGATIVE
Ketones, UA: NEGATIVE
Nitrite, UA: NEGATIVE
Protein, UA: NEGATIVE
Spec Grav, UA: 1.01 (ref 1.010–1.025)
Urobilinogen, UA: 0.2 E.U./dL
pH, UA: 5 (ref 5.0–8.0)

## 2021-10-27 MED ORDER — NITROFURANTOIN MONOHYD MACRO 100 MG PO CAPS: 100.0000 mg | ORAL_CAPSULE | Freq: Two times a day (BID) | ORAL | 0 refills | Status: AC

## 2021-10-27 MED ORDER — FLUCONAZOLE 150 MG PO TABS: 150.0000 mg | ORAL_TABLET | Freq: Once | ORAL | 0 refills | Status: AC

## 2021-10-27 NOTE — Progress Notes (Signed)
? ? ?Date:  10/27/2021  ? ?Name:  Carrie Roberson   DOB:  08/23/1945   MRN:  [id number] ? ? ?Chief Complaint: Urinary Tract Infection ? ?Urinary Tract Infection  ?This is a new problem. Episode onset: day before yesterday. The problem has been gradually worsening. The quality of the pain is described as burning. The pain is moderate. Associated symptoms include frequency and urgency. Pertinent negatives include no chills, discharge, flank pain, hematuria, hesitancy, nausea, sweats or vomiting. The treatment provided moderate relief.  ? ?Lab Results  ?Component Value Date  ? NA 140 07/28/2021  ? K 4.7 07/28/2021  ? CO2 23 07/28/2021  ? GLUCOSE 88 07/28/2021  ? BUN 13 07/28/2021  ? CREATININE 0.68 07/28/2021  ? CALCIUM 10.7 (H) 07/28/2021  ? EGFR 90 07/28/2021  ? GFRNONAA >60 07/21/2021  ? ?Lab Results  ?Component Value Date  ? CHOL 188 09/02/2017  ? HDL 53 09/02/2017  ? LDLCALC 109 (H) 09/02/2017  ? TRIG 131 09/02/2017  ? CHOLHDL 3.5 09/02/2017  ? ?Lab Results  ?Component Value Date  ? TSH 1.535 07/19/2021  ? ?No results found for: HGBA1C ?Lab Results  ?Component Value Date  ? WBC 8.7 07/21/2021  ? HGB 12.0 07/21/2021  ? HCT 35.5 (L) 07/21/2021  ? MCV 92.7 07/21/2021  ? PLT 178 07/21/2021  ? ?Lab Results  ?Component Value Date  ? ALT 28 07/19/2021  ? AST 25 07/19/2021  ? ALKPHOS 104 07/19/2021  ? BILITOT 0.8 07/19/2021  ? ?No results found for: 25OHVITD2, [place], VD25OH  ? ?Review of Systems  ?Constitutional:  Negative for chills and fever.  ?HENT:  Negative for drooling, ear discharge, ear pain and sore throat.   ?Respiratory:  Negative for cough, shortness of breath and wheezing.   ?Cardiovascular:  Negative for chest pain, palpitations and leg swelling.  ?Gastrointestinal:  Negative for abdominal pain, blood in stool, constipation, diarrhea, nausea and vomiting.  ?Endocrine: Negative for polydipsia.  ?Genitourinary:  Positive for frequency and urgency. Negative for dysuria, flank pain, hematuria and hesitancy.   ?Musculoskeletal:  Negative for back pain, myalgias and neck pain.  ?Skin:  Negative for rash.  ?Allergic/Immunologic: Negative for environmental allergies.  ?Neurological:  Negative for dizziness and headaches.  ?Hematological:  Does not bruise/bleed easily.  ?Psychiatric/Behavioral:  Negative for suicidal ideas. The patient is not nervous/anxious.   ? ?Patient Active Problem List  ? Diagnosis Date Noted  ? Acute colitis 07/20/2021  ? Colitis 07/19/2021  ? Chemotherapy induced neutropenia ([place]) 05/02/2021  ? 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 Genetic testing 11/02/2019  ? Weight loss, unintentional 10/16/2019  ? Chemotherapy induced diarrhea 10/13/2019  ? Orthostasis 10/13/2019  ? Hypokalemia 10/13/2019  ? Restless leg 10/13/2019  ? 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 Iron deficiency 10/02/2019  ? Anxiety associated with cancer diagnosis ([place]) 10/02/2019  ? Chemotherapy-induced nausea 10/02/2019  ? Thyroid nodule 10/02/2019  ? Hyponatremia 10/02/2019  ? B12 deficiency 09/18/2019  ? Family history of breast cancer   ? Family history of leukemia   ? Goals of care, counseling/discussion 09/07/2019  ? Ovarian mass 08/19/2019  ? 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HCC)   ? Cyst of ovary 07/15/2019  ? Anxiety 09/02/2017  ? 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 FH: diabetes mellitus 09/02/2017  ? Obesity, Class I, BMI 30.0-34.9 (see actual BMI) 09/02/2017  ? Atypical nevus 09/02/2017  ? IBS (irritable bowel  syndrome) 09/02/2017  ? Atrophic vaginitis 09/02/2017  ? Chronic idiopathic urticaria 09/02/2017  ? GERD (gastroesophageal reflux disease) 09/02/2017  ? ? ?Allergies  ?Allergen Reactions  ? Citalopram Nausea And Vomiting  ? Ivp Dye [Iodinated Contrast Media] Hives and Swelling  ?  hives  ? Meloxicam   ?  unknown  ? Adhesive [Tape] Rash  ? Amlodipine Rash  ? Ampicillin Rash   ?  Did it involve swelling of the face/tongue/throat, SOB, or low BP? No ?Did it involve sudden or severe rash/hives, skin peeling, or any reaction on the inside of your mouth or nose? Yes ?Did you need to seek medical attention at a hospital or doctor's office? Yes ?When did it last happen?       10 + years ?If all above answers are ?NO?, may proceed with cephalosporin use.  ? Cefoxitin Rash  ? Chlorhexidine Rash  ?  Chloraprep Scrub Teal  ? Estradiol Rash  ? Estropipate Rash  ? Olmesartan Medoxomil-Hctz Rash  ? Sertraline Rash  ?   ?  ? Sulfa Antibiotics Rash  ? ? ?Past Surgical History:  ?Procedure Laterality Date  ? ABDOMINAL HYSTERECTOMY    ? BREAST BIOPSY Left   ? needle bx/clip-neg  ? CERVICAL BIOPSY  W/ LOOP ELECTRODE EXCISION    ? CHOLECYSTECTOMY    ? Colonoscopoy  2007, 2008, 2011  ? COLONOSCOPY WITH PROPOFOL N/A 05/05/2018  ? Procedure: COLONOSCOPY WITH PROPOFOL;  Surgeon: Lin Landsman, MD;  Location: [facility] ENDOSCOPY;  Service: Gastroenterology;  Laterality: N/A;  ? FLEXIBLE SIGMOIDOSCOPY N/A 07/20/2021  ? Procedure: FLEXIBLE SIGMOIDOSCOPY;  Surgeon: Lesly Rubenstein, MD;  Location: [facility] ENDOSCOPY;  Service: Endoscopy;  Laterality: N/A;  ? HYSTERECTOMY ABDOMINAL WITH SALPINGO-OOPHORECTOMY N/A 08/19/2019  ? Procedure: HYSTERECTOMY ABDOMINAL WITH SALPINGO-OOPHORECTOMY WITH PELVIC WASHINGS PERITONEAL BIOPSIES AND PARA AORTIC LYMPH NODE DISSECTION;  Surgeon: [name], MD;  Location: [facility] ORS;  Service: Gynecology;  Laterality: N/A;  ? LEEP    ? OMENTECTOMY N/A 08/19/2019  ? Procedure: OMENTECTOMY;  Surgeon: [name], MD;  Location: [facility] ORS;  Service: Gynecology;  Laterality: N/A;  ? OOPHORECTOMY    ? PORTA CATH INSERTION N/A 09/11/2019  ? Procedure: PORTA CATH INSERTION;  Surgeon: Algernon Huxley, MD;  Location: [facility] CV LAB;  Service: Cardiovascular;  Laterality: N/A;  ? TUBAL LIGATION  1999  ? ? ?Social History  ? ?Tobacco Use  ? Smoking status: Never  ? Smokeless tobacco:  Never  ? Tobacco comments:  ?  2bd hand smoke from father  ?Vaping Use  ? Vaping Use: Never used  ?Substance Use Topics  ? Alcohol use: Yes  ?  Comment: rarely  ? Drug use: No  ? ? ? ?Medication list has been reviewed and updated. ? ?Current Meds  ?Medication Sig  ? acetaminophen (TYLENOL) 500 MG tablet Take 1,000 mg by mouth every 6 (six) hours as needed (for pain.).  ? fluticasone (FLONASE) 50 MCG/ACT nasal spray Place 1 spray into both nostrils daily. otc  ? hydrocortisone (ANUSOL-HC) 2.5 % rectal cream Place 1 application rectally 2 (two) times daily.  ? lisinopril (ZESTRIL) 5 MG tablet Take 1 tablet (5 mg total) by mouth daily.  ? loratadine (CLARITIN) 10 MG tablet Take 10 mg by mouth daily. otc  ? mirabegron ER (MYRBETRIQ) 50 MG TB24 tablet Take 1 tablet (50 mg total) by mouth daily.  ? terconazole (TERAZOL 7) 0.4 % vaginal cream Place 1 applicator vaginally at bedtime.  ? ? ? ?  10/27/2021  ? 10:40 AM 03/30/2021  ?  9:50 AM 03/29/2020  ? 11:14 AM 02/23/2020  ? 10:48 AM  ?GAD 7 : Generalized Anxiety Score  ?Nervous, Anxious, on Edge 0 0 1 1  ?Control/stop worrying 0 1 0 0  ?Worry too much - different things 0 0 0 0  ?Trouble relaxing 0 1 0 0  ?Restless 0 0 0 0  ?Easily annoyed or irritable 0 0 1 0  ?Afraid - awful might happen 0 0 0 0  ?Total GAD 7 Score 0 2 2 1   ?Anxiety Difficulty Not difficult at all Not difficult at all Not difficult at all Not difficult at all  ? ? ? ?  10/27/2021  ? 10:40 AM  ?Depression screen PHQ 2/9  ?Decreased Interest 0  ?Down, Depressed, Hopeless 0  ?PHQ - 2 Score 0  ?Altered sleeping 0  ?Tired, decreased energy 0  ?Change in appetite 0  ?Feeling bad or failure about yourself  0  ?Trouble concentrating 0  ?Moving slowly or fidgety/restless 0  ?Suicidal thoughts 0  ?PHQ-9 Score 0  ?Difficult doing work/chores Not difficult at all  ? ? ?BP Readings from Last 3 Encounters:  ?10/27/21 (!) 144/100  ?10/08/21 (!) 150/84  ?08/04/21 132/78  ? ? ?Physical Exam ?Vitals and nursing note reviewed.   ?Constitutional:   ?   Appearance: She is well-developed.  ?HENT:  ?   Head: Normocephalic.  ?   Right Ear: Tympanic membrane and external ear normal.  ?   Left Ear: Tympanic membrane and external ear normal.  ?Eyes

## 2021-10-30 ENCOUNTER — Inpatient Hospital Stay: Payer: Medicare Other | Admitting: Licensed Clinical Social Worker

## 2021-10-30 ENCOUNTER — Encounter: Payer: Self-pay | Admitting: Oncology

## 2021-10-30 NOTE — Progress Notes (Signed)
[place] CSW Progress Note ? ?Clinical Social Worker met with patient to discuss adjustment issues. Note completed on appointment date. ? ? ? ?Carrie Whitmoyer LCSW, LCSW ?

## 2021-10-30 NOTE — Progress Notes (Signed)
[place] CSW Progress Note ? ?Clinical Social Worker met with patient to discuss adjustment issues. Not completed on appointment date ? ? ? ?Justin Meisenheimer LCSW, LCSW ?

## 2021-10-30 NOTE — Progress Notes (Signed)
Note completed on appointment date ?

## 2021-10-31 ENCOUNTER — Ambulatory Visit: Payer: Medicare Other | Admitting: Family Medicine

## 2021-10-31 LAB — URINE CULTURE

## 2021-11-01 DIAGNOSIS — Z09 Encounter for follow-up examination after completed treatment for conditions other than malignant neoplasm: Secondary | ICD-10-CM | POA: Diagnosis not present

## 2021-11-01 DIAGNOSIS — Z86010 Personal history of colonic polyps: Secondary | ICD-10-CM | POA: Diagnosis not present

## 2021-11-15 ENCOUNTER — Other Ambulatory Visit: Payer: Self-pay | Admitting: *Deleted

## 2021-11-15 DIAGNOSIS — C561 Malignant neoplasm of right ovary: Secondary | ICD-10-CM

## 2021-11-16 ENCOUNTER — Inpatient Hospital Stay: Payer: Medicare Other | Attending: Oncology | Admitting: Licensed Clinical Social Worker

## 2021-11-16 DIAGNOSIS — Z8543 Personal history of malignant neoplasm of ovary: Secondary | ICD-10-CM | POA: Insufficient documentation

## 2021-11-16 DIAGNOSIS — Z803 Family history of malignant neoplasm of breast: Secondary | ICD-10-CM | POA: Insufficient documentation

## 2021-11-16 DIAGNOSIS — C561 Malignant neoplasm of right ovary: Secondary | ICD-10-CM

## 2021-11-16 DIAGNOSIS — D509 Iron deficiency anemia, unspecified: Secondary | ICD-10-CM | POA: Insufficient documentation

## 2021-11-16 DIAGNOSIS — I10 Essential (primary) hypertension: Secondary | ICD-10-CM | POA: Insufficient documentation

## 2021-11-16 DIAGNOSIS — Z806 Family history of leukemia: Secondary | ICD-10-CM | POA: Insufficient documentation

## 2021-11-16 DIAGNOSIS — D709 Neutropenia, unspecified: Secondary | ICD-10-CM | POA: Insufficient documentation

## 2021-11-16 DIAGNOSIS — F419 Anxiety disorder, unspecified: Secondary | ICD-10-CM | POA: Insufficient documentation

## 2021-11-16 DIAGNOSIS — Z90710 Acquired absence of both cervix and uterus: Secondary | ICD-10-CM | POA: Insufficient documentation

## 2021-11-16 NOTE — Progress Notes (Signed)
[place] CSW Progress Note ? ?Clinical Social Worker met with patient to discuss increased anxiety due to adjustment issues. Patient stated she's noticed an increase in anxiety the last two weeks.  Patient stated she already has an appointment scheduled with a psychiatrist for medication management but it is still a month away. Patient mentioned her main source of anxiety is her 15lbs weight loss since December.  Patient stated her IBS flares up and she immediately becomes nervous worried that it is the cancer returning and that causes an increase of anxiety which causes nausea that prevents her from eating.  Patient stated when she feels nauseous due to the anxiety she eats mostly crackers and toast. Patient stated she is aware that eating less leads to weight loss, but she is not very hungry when she is anxious.  CSW and patient discussed ways to reduce anxiety and skills that may be effective.  CSW and patient discussed other sources of anxiety and ways to address those, to assist with the overall reduction of anxiety.  Patient will continue using skills and will identify other sources of anxiety (that are not the cancer returning) and set goals to address those sources.  Patient scheduled appointment for May 4th at 2:00PM ? ? ? ?Bertel Venard LCSW, LCSW ?

## 2021-11-17 ENCOUNTER — Inpatient Hospital Stay (HOSPITAL_BASED_OUTPATIENT_CLINIC_OR_DEPARTMENT_OTHER): Payer: Medicare Other | Admitting: Oncology

## 2021-11-17 ENCOUNTER — Other Ambulatory Visit: Payer: Self-pay

## 2021-11-17 ENCOUNTER — Other Ambulatory Visit: Payer: Medicare Other

## 2021-11-17 ENCOUNTER — Encounter: Payer: Self-pay | Admitting: Oncology

## 2021-11-17 ENCOUNTER — Inpatient Hospital Stay: Payer: Medicare Other

## 2021-11-17 VITALS — BP 116/73 | HR 71 | Temp 98.1°F | Resp 16 | Wt 165.9 lb

## 2021-11-17 DIAGNOSIS — Z08 Encounter for follow-up examination after completed treatment for malignant neoplasm: Secondary | ICD-10-CM | POA: Diagnosis not present

## 2021-11-17 DIAGNOSIS — C561 Malignant neoplasm of right ovary: Secondary | ICD-10-CM

## 2021-11-17 DIAGNOSIS — D709 Neutropenia, unspecified: Secondary | ICD-10-CM

## 2021-11-17 DIAGNOSIS — F419 Anxiety disorder, unspecified: Secondary | ICD-10-CM

## 2021-11-17 DIAGNOSIS — D509 Iron deficiency anemia, unspecified: Secondary | ICD-10-CM | POA: Diagnosis not present

## 2021-11-17 DIAGNOSIS — Z806 Family history of leukemia: Secondary | ICD-10-CM | POA: Diagnosis not present

## 2021-11-17 DIAGNOSIS — Z8543 Personal history of malignant neoplasm of ovary: Secondary | ICD-10-CM

## 2021-11-17 DIAGNOSIS — Z803 Family history of malignant neoplasm of breast: Secondary | ICD-10-CM | POA: Diagnosis not present

## 2021-11-17 DIAGNOSIS — Z95828 Presence of other vascular implants and grafts: Secondary | ICD-10-CM

## 2021-11-17 DIAGNOSIS — I10 Essential (primary) hypertension: Secondary | ICD-10-CM | POA: Diagnosis not present

## 2021-11-17 DIAGNOSIS — Z90710 Acquired absence of both cervix and uterus: Secondary | ICD-10-CM | POA: Diagnosis not present

## 2021-11-17 LAB — COMPREHENSIVE METABOLIC PANEL
ALT: 15 U/L (ref 0–44)
AST: 17 U/L (ref 15–41)
Albumin: 3.9 g/dL (ref 3.5–5.0)
Alkaline Phosphatase: 93 U/L (ref 38–126)
Anion gap: 3 — ABNORMAL LOW (ref 5–15)
BUN: 8 mg/dL (ref 8–23)
CO2: 25 mmol/L (ref 22–32)
Calcium: 9.5 mg/dL (ref 8.9–10.3)
Chloride: 100 mmol/L (ref 98–111)
Creatinine, Ser: 0.74 mg/dL (ref 0.44–1.00)
GFR, Estimated: >60 mL/min (ref >60)
Glucose, Bld: 98 mg/dL (ref 70–99)
Potassium: 3.7 mmol/L (ref 3.5–5.1)
Sodium: 128 mmol/L — ABNORMAL LOW (ref 135–145)
Total Bilirubin: 0.9 mg/dL (ref 0.3–1.2)
Total Protein: 6.8 g/dL (ref 6.5–8.1)

## 2021-11-17 LAB — CBC WITH DIFFERENTIAL/PLATELET
Abs Immature Granulocytes: 0.01 10*3/uL (ref 0.00–0.07)
Basophils Absolute: 0 10*3/uL (ref 0.0–0.1)
Basophils Relative: 0 %
Eosinophils Absolute: 0.1 10*3/uL (ref 0.0–0.5)
Eosinophils Relative: 2 %
HCT: 42.5 % (ref 36.0–46.0)
Hemoglobin: 14.6 g/dL (ref 12.0–15.0)
Immature Granulocytes: 0 %
Lymphocytes Relative: 47 %
Lymphs Abs: 1.7 10*3/uL (ref 0.7–4.0)
MCH: 30.6 pg (ref 26.0–34.0)
MCHC: 34.4 g/dL (ref 30.0–36.0)
MCV: 89.1 fL (ref 80.0–100.0)
Monocytes Absolute: 0.4 10*3/uL (ref 0.1–1.0)
Monocytes Relative: 11 %
Neutro Abs: 1.5 10*3/uL — ABNORMAL LOW (ref 1.7–7.7)
Neutrophils Relative %: 40 %
Platelets: 261 10*3/uL (ref 150–400)
RBC: 4.77 MIL/uL (ref 3.87–5.11)
RDW: 12.3 % (ref 11.5–15.5)
WBC: 3.7 10*3/uL — ABNORMAL LOW (ref 4.0–10.5)
nRBC: 0 % (ref 0.0–0.2)

## 2021-11-17 MED ORDER — LORAZEPAM 0.5 MG PO TABS: 0.5000 mg | ORAL_TABLET | Freq: Four times a day (QID) | ORAL | 0 refills | Status: DC | PRN

## 2021-11-17 MED ORDER — SODIUM CHLORIDE 0.9% FLUSH
10.0000 mL | Freq: Once | INTRAVENOUS | Status: DC
  Administered 2021-11-17: 10 mL via INTRAVENOUS
  Filled 2021-11-17: qty 10

## 2021-11-17 MED ORDER — HEPARIN SOD (PORK) LOCK FLUSH 100 UNIT/ML IV SOLN
500.0000 [IU] | Freq: Once | INTRAVENOUS | Status: DC
  Administered 2021-11-17: 500 [IU] via INTRAVENOUS
  Filled 2021-11-17: qty 5

## 2021-11-17 NOTE — Progress Notes (Signed)
For the time being pt will like a refill of ATIVAN due to anxiety states shes been more anxious lately and has an appointment with a physchiarist next month to get that squared away but needs something for now. Has a scheduled colonscopy in June and will like to talk about risks.  ?

## 2021-11-17 NOTE — Progress Notes (Signed)
? ? ? ?Hematology/Oncology Consult note ?[facility]  ?[phone] [phone] ? ?Patient Care Team: ?Juline Patch, MD as PCP - General (Family Medicine) ?Lin Landsman, MD as Consulting Physician (Gastroenterology) ?Clent Jacks, RN as Oncology Nurse Navigator ?Mellody Drown, MD as Referring Physician (Obstetrics) ?Sindy Guadeloupe, MD as Consulting Physician (Oncology) ?[name], MD as Consulting Physician (Obstetrics and Gynecology)  ? ?Name of the patient: Carrie Roberson  ?[id number]  ?Jul 25, 1945  ? ?Date of visit: 11/17/21 ? 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 ?Chief complaint/ Reason for visit-routine follow-up of ovarian cancer ? ?Heme/Onc history: Patient is a 76 year old female who presented with symptoms of pelvic pressure and recurrent urinary tract infection.  This was followed by an MR pelvis with and without contrast in December 2020 which showed a large cystic mass arising from the right ovary measuring 1.8 cm.  This was followed by TAH/BSO with peritoneal biopsies with pelvic/aortic lymph node dissection pelvic washings and omentectomy with Dr. Fransisca Connors and Dr. Georgianne Fick on 08/19/2019.  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  Angiolymphatic invasion is present.  Bilateral fallopian tubes were negative for atypia and malignancy.  Peritoneal stone excision negative for malignancy.  Uterus with cervix negative for atypia and malignancy.  Bladder, right gutter negative for malignancy.  5 lymph nodes were sampled and were negative for malignancy.  Pelvic washings were also negative for malignancy.  Pathologic staging FIGO 1 C2 ?  ?Patient was seen by Dr. Fransisca Connors postoperatively and recommendation was to proceed with adjuvant chemotherapy carbotaxol for 6 cycles. ?  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  The CancerNext gene panel offered by  [employer] includes sequencing and rearrangement analysis for the following 34 genes:   APC, ATM, BARD1, BMPR1A, BRCA1, BRCA2, BRIP1, CDH1, CDK4, CDKN2A, CHEK2, DICER1, HOXB13, EPCAM, GREM1, MLH1, MRE11A, MSH2, MSH6, MUTYH, NBN, NF1, PALB2, PMS2, POLD1, POLE, PTEN, RAD50, RAD51C, RAD51D, SMAD4, SMARCA4, STK11, and TP53.  The report date is 10/29/2019. ?  ?TumorHRD testing was negative.   ?  ?  ? ?Interval history-patient was admitted to the hospital in December 2022 with C. difficile colitis.  She is gradually recovering from that.  She has problems with chronic IBS constipation for which she is on MiraLAX.  Also reports significant anxiety.  She has been discussing with [facility] for counseling as well.  She has an appointment coming up with psychiatry next month. ? ?ECOG PS- 1 ?Pain scale- 0 ? ? ?Review of systems- Review of Systems  ?Constitutional:  Negative for chills, fever, malaise/fatigue and weight loss.  ?HENT:  Negative for congestion, ear discharge and nosebleeds.   ?Eyes:  Negative for blurred vision.  ?Respiratory:  Negative for cough, hemoptysis, sputum production, shortness of breath and wheezing.   ?Cardiovascular:  Negative for chest pain, palpitations, orthopnea and claudication.  ?Gastrointestinal:  Negative for abdominal pain, blood in stool, constipation, diarrhea, heartburn, melena, nausea and vomiting.  ?Genitourinary:  Negative for dysuria, flank pain, frequency, hematuria and urgency.  ?Musculoskeletal:  Negative for back pain, joint pain and myalgias.  ?Skin:  Negative for rash.  ?Neurological:  Negative for dizziness, tingling, focal weakness, seizures, weakness and headaches.  ?Endo/Heme/Allergies:  Does not bruise/bleed easily.  ?Psychiatric/Behavioral:  Negative for depression and suicidal ideas. The patient is nervous/anxious. The patient does not have insomnia.    ? ? ? ?Allergies  ?Allergen Reactions  ?  Citalopram Nausea And Vomiting  ? Ivp Dye [Iodinated Contrast Media] Hives and  Swelling  ?  hives  ? Meloxicam   ?  unknown  ? Adhesive [Tape] Rash  ? Amlodipine Rash  ? Ampicillin Rash  ?  Did it involve swelling of the face/tongue/throat, SOB, or low BP? No ?Did it involve sudden or severe rash/hives, skin peeling, or any reaction on the inside of your mouth or nose? Yes ?Did you need to seek medical attention at a hospital or doctor's office? Yes ?When did it last happen?       10 + years ?If all above answers are ?NO?, may proceed with cephalosporin use.  ? Cefoxitin Rash  ? Chlorhexidine Rash  ?  Chloraprep Scrub Teal  ? Estradiol Rash  ? Estropipate Rash  ? Olmesartan Medoxomil-Hctz Rash  ? Sertraline Rash  ?   ?  ? Sulfa Antibiotics Rash  ? ? ? ?Past Medical History:  ?Diagnosis Date  ? Anxiety   ? Arthritis   ? Diverticulitis   ? Family history of breast cancer   ? Family history of leukemia   ? GERD (gastroesophageal reflux disease)   ? History of ischemic colitis 09/02/2017  ? Hx of colonic polyp   ? Hypertension   ? H/O-PT TAKEN OFF BY PCP AS OF 2019  ? IBS (irritable bowel syndrome)   ? Ovarian cancer ([place])   ? Ovarian cyst   ? Personal history of chemotherapy 2021  ? 6 treatments  ? Vaginal atrophy   ? ? ? ?Past Surgical History:  ?Procedure Laterality Date  ? ABDOMINAL HYSTERECTOMY    ? BREAST BIOPSY Left   ? needle bx/clip-neg  ? CERVICAL BIOPSY  W/ LOOP ELECTRODE EXCISION    ? CHOLECYSTECTOMY    ? Colonoscopoy  2007, 2008, 2011  ? COLONOSCOPY WITH PROPOFOL N/A 05/05/2018  ? Procedure: COLONOSCOPY WITH PROPOFOL;  Surgeon: Lin Landsman, MD;  Location: [facility] ENDOSCOPY;  Service: Gastroenterology;  Laterality: N/A;  ? FLEXIBLE SIGMOIDOSCOPY N/A 07/20/2021  ? Procedure: FLEXIBLE SIGMOIDOSCOPY;  Surgeon: Lesly Rubenstein, MD;  Location: [facility] ENDOSCOPY;  Service: Endoscopy;  Laterality: N/A;  ? HYSTERECTOMY ABDOMINAL WITH SALPINGO-OOPHORECTOMY N/A 08/19/2019  ? Procedure: HYSTERECTOMY ABDOMINAL WITH SALPINGO-OOPHORECTOMY WITH PELVIC WASHINGS PERITONEAL BIOPSIES AND PARA AORTIC  LYMPH NODE DISSECTION;  Surgeon: [name], MD;  Location: [facility] ORS;  Service: Gynecology;  Laterality: N/A;  ? LEEP    ? OMENTECTOMY N/A 08/19/2019  ? Procedure: OMENTECTOMY;  Surgeon: [name], MD;  Location: [facility] ORS;  Service: Gynecology;  Laterality: N/A;  ? OOPHORECTOMY    ? PORTA CATH INSERTION N/A 09/11/2019  ? Procedure: PORTA CATH INSERTION;  Surgeon: Algernon Huxley, MD;  Location: [place] CV LAB;  Service: Cardiovascular;  Laterality: N/A;  ? TUBAL LIGATION  1999  ? ? ?Social History  ? ?Socioeconomic History  ? Marital status: Single  ?  Spouse name: Not on file  ? Number of children: 0  ? Years of education: Not on file  ? Highest education level: Associate degree: academic program  ?Occupational History  ? Occupation: Retired  ?Tobacco Use  ? Smoking status: Never  ? Smokeless tobacco: Never  ? Tobacco comments:  ?  2bd hand smoke from father  ?Vaping Use  ? Vaping Use: Never used  ?Substance and Sexual Activity  ? Alcohol use: Yes  ?  Comment: rarely  ? Drug use: No  ? Sexual activity: Not Currently  ?  Birth control/protection: Post-menopausal  ?  Other Topics Concern  ? Not on file  ?Social History Narrative  ? Not on file  ? ?Social Determinants of Health  ? ?Financial Resource Strain: Low Risk   ? Difficulty of Paying Living Expenses: Not hard at all  ?Food Insecurity: No Food Insecurity  ? Worried About Charity fundraiser in the Last Year: Never true  ? Ran Out of Food in the Last Year: Never true  ?Transportation Needs: No Transportation Needs  ? Lack of Transportation (Medical): No  ? Lack of Transportation (Non-Medical): No  ?Physical Activity: Inactive  ? Days of Exercise per Week: 0 days  ? Minutes of Exercise per Session: 0 min  ?Stress: Stress Concern Present  ? Feeling of Stress : Very much  ?Social Connections: Socially Isolated  ? Frequency of Communication with Friends and Family: Once a week  ? Frequency of Social Gatherings with Friends and Family: Never  ? Attends  Religious Services: Never  ? Active Member of Clubs or Organizations: Yes  ? Attends Archivist Meetings: Never  ? Marital Status: Never married  ?Intimate Partner Violence: Not At Risk  ?

## 2021-11-20 LAB — CA 125: Cancer Antigen (CA) 125: 8.2 U/mL (ref 0.0–38.1)

## 2021-11-23 ENCOUNTER — Inpatient Hospital Stay: Payer: Medicare Other | Attending: Oncology | Admitting: Licensed Clinical Social Worker

## 2021-11-23 DIAGNOSIS — M5136 Other intervertebral disc degeneration, lumbar region: Secondary | ICD-10-CM | POA: Insufficient documentation

## 2021-11-23 DIAGNOSIS — Z808 Family history of malignant neoplasm of other organs or systems: Secondary | ICD-10-CM | POA: Insufficient documentation

## 2021-11-23 DIAGNOSIS — E041 Nontoxic single thyroid nodule: Secondary | ICD-10-CM | POA: Insufficient documentation

## 2021-11-23 DIAGNOSIS — R918 Other nonspecific abnormal finding of lung field: Secondary | ICD-10-CM | POA: Insufficient documentation

## 2021-11-23 DIAGNOSIS — I10 Essential (primary) hypertension: Secondary | ICD-10-CM | POA: Insufficient documentation

## 2021-11-23 DIAGNOSIS — Z803 Family history of malignant neoplasm of breast: Secondary | ICD-10-CM | POA: Insufficient documentation

## 2021-11-23 DIAGNOSIS — C561 Malignant neoplasm of right ovary: Secondary | ICD-10-CM | POA: Insufficient documentation

## 2021-11-23 DIAGNOSIS — Z806 Family history of leukemia: Secondary | ICD-10-CM | POA: Insufficient documentation

## 2021-11-23 DIAGNOSIS — Z90710 Acquired absence of both cervix and uterus: Secondary | ICD-10-CM | POA: Insufficient documentation

## 2021-11-23 NOTE — Progress Notes (Signed)
[facility] CSW Progress Note ? ?Clinical Social Worker met with patient to adjustment concerns. Patient reports she was able to get some of the clothing in her room organized and feels this made a positive impact on her anxiety this past week.  Patient stated she was surprised the organization was so effective and plans to continue with the process.  Patient also stated she met with Dr. Janese Banks and was reassured by the conversation she had with her and noticed her anxiety has reduced.  CSW and patient spoke about advocating for oneself when speaking with healthcare providers and went over different ways to address concerns. Patient mentioned a source of stress is her forgetfulness.  CSW and patient discussed different ways to assist with forgetfulness, including writing things down, using Siri function on her iPhone and building memory skills like cross word puzzles, or starting new embroidery projects.  Patient stated she will continue to work on de-cluttering and organizing her clothing, and identifying any anxiety inducing triggers that may come up while doing it. Patient scheduled appointment for May 24th, 2023 at 2:00PM.  ? ? ? ?Esequiel Kleinfelter LCSW, LCSW ?

## 2021-11-27 ENCOUNTER — Ambulatory Visit: Payer: Medicare Other | Admitting: Family Medicine

## 2021-12-03 NOTE — Progress Notes (Signed)
Psychiatric Initial Adult Assessment  ? ?Patient Identification: Carrie Roberson ?MRN:  [id number] ?Date of Evaluation:  12/05/2021 ?Referral Source: Verlon Au, NP  ?Chief Complaint:   ?Chief Complaint  ?Patient presents with  ? Establish Care  ? Anxiety  ? ?Visit Diagnosis:  ?  ICD-10-CM   ?1. Generalized anxiety disorder  F41.1   ?  ?2. MDD (major depressive disorder), recurrent episode, moderate (HCC)  F33.1   ?  ? ? ?History of Present Illness:   ?Carrie Roberson is a 76 y.o. year old female with a history of anxiety, high grade serous carcinoma of the ovary s/p TAH/BSO, s/p six cycles of carbo taxol chemotherapy , who is referred for anxiety.  ? ?She states that she has been struggling with survivor's guilt.  When she looks at the children with cancer, she wonders to herself why she survived despite she has no purpose.  Although she thinks of this as "silly," she cannot help feeling this way. She states that although it was not hard to go through chemotherapy as there was certain regimen/schedule she had to do, she struggled after returning home, being by herself while taking care of her dogs as her sister was not at home that time.  She also had a stress of having gone through biopsy in her breast, although it turned out to be non significant. She had hematochezia, constipation, although it has improved. She tends to feel anxious and worried when she has physical symptoms, although she was not like this before. She reports fair relationship with her sister, who lives together ("co-living"). She has depressive symptoms as in PHQ-9.  Although she used to enjoy handwork, and she has desires to do this, she cannot start doing this due to difficulty in concentration, and anhedonia. She wants to have that enthusiasm again, and find her way back to how she used to be doing.  ? ?Depression/anxiety-she has depressive symptoms and in PHQ-9.  There are days she does not change clothes or clean the house due to the way she is  feeling, although she denies any difficulty in physical aspects.  She denies SI.  She has insomnia due to anxiety. She had 15 lbs weight loss in the setting of [place]. She has good appetite lately.  She feels tense all the time and anxious. She denies panic attacks.  ? ?Substance- denies alcohol use, drug use ? ?Medication- lorazepam 0.5 mg daily as needed for anxiety (rarely takes this medication) ? ?Household: sister, two dogs ?Marital status: single ?Number of children: ?Employment: retired in 2002, Chartered certified accountant ?Education:   ?She was raised in [place]. She has three siblings. Her brother deceased from drowning accident at age 37. She was 76 year old that time. She states that she thinks about him every day, and has never passed it.  She was taking care of her mother, who passed away at age 54. Although she misses her mother, she thinks it was better for her as her mother did not have good quality of life later in the course. ? ?Wt Readings from Last 3 Encounters:  ?12/05/21 165 lb 12.8 oz (75.2 kg)  ?11/17/21 165 lb 14.4 oz (75.3 kg)  ?10/27/21 170 lb (77.1 kg)  ?  ?Associated Signs/Symptoms: ?Depression Symptoms:  depressed mood, ?anhedonia, ?insomnia, ?fatigue, ?difficulty concentrating, ?anxiety, ?(Hypo) Manic Symptoms:   denies decreased need for sleep, euphoria ?Anxiety Symptoms:  Excessive Worry, ?Psychotic Symptoms:   denies AH, VH, paranoia ?PTSD Symptoms: ?Negative ? ?Past Psychiatric History:  ?  Outpatient:  ?Psychiatry admission: denies  ?Previous suicide attempt: denies ?Past trials of medication: she does not recall- sertraline (rash), citalopram (nausea) according to the chart ?History of violence:   ? ?Previous Psychotropic Medications: Yes  ? ?Substance Abuse History in the last 12 months:  No. ? ?Consequences of Substance Abuse: ?NA ? ?Past Medical History:  ?Past Medical History:  ?Diagnosis Date  ? Anxiety   ? Arthritis   ? Diverticulitis   ? Family history of breast cancer   ? Family history of  leukemia   ? GERD (gastroesophageal reflux disease)   ? History of ischemic colitis 09/02/2017  ? Hx of colonic polyp   ? Hypertension   ? H/O-PT TAKEN OFF BY PCP AS OF 2019  ? IBS (irritable bowel syndrome)   ? Ovarian cancer ([place])   ? Ovarian cyst   ? Personal history of chemotherapy 2021  ? 6 treatments  ? Vaginal atrophy   ?  ?Past Surgical History:  ?Procedure Laterality Date  ? ABDOMINAL HYSTERECTOMY    ? BREAST BIOPSY Left   ? needle bx/clip-neg  ? CERVICAL BIOPSY  W/ LOOP ELECTRODE EXCISION    ? CHOLECYSTECTOMY    ? Colonoscopoy  2007, 2008, 2011  ? COLONOSCOPY WITH PROPOFOL N/A 05/05/2018  ? Procedure: COLONOSCOPY WITH PROPOFOL;  Surgeon: Lin Landsman, MD;  Location[facility] ENDOSCOPY;  Service: Gastroenterology;  Laterality: N/A;  ? FLEXIBLE SIGMOIDOSCOPY N/A 07/20/2021  ? Procedure: FLEXIBLE SIGMOIDOSCOPY;  Surgeon: Lesly Rubenstein, MD;  Location: [facility] ENDOSCOPY;  Service: Endoscopy;  Laterality: N/A;  ? HYSTERECTOMY ABDOMINAL WITH SALPINGO-OOPHORECTOMY N/A 08/19/2019  ? Procedure: HYSTERECTOMY ABDOMINAL WITH SALPINGO-OOPHORECTOMY WITH PELVIC WASHINGS PERITONEAL BIOPSIES AND PARA AORTIC LYMPH NODE DISSECTION;  Surgeon: [name], MD;  Location: [facility] ORS;  Service: Gynecology;  Laterality: N/A;  ? LEEP    ? OMENTECTOMY N/A 08/19/2019  ? Procedure: OMENTECTOMY;  Surgeon: [name], MD;  Location: [facility] ORS;  Service: Gynecology;  Laterality: N/A;  ? OOPHORECTOMY    ? PORTA CATH INSERTION N/A 09/11/2019  ? Procedure: PORTA CATH INSERTION;  Surgeon: Algernon Huxley, MD;  Location: [place] CV LAB;  Service: Cardiovascular;  Laterality: N/A;  ? TUBAL LIGATION  1999  ? ? ?Family Psychiatric History: denies ? ?Family History:  ?Family History  ?Problem Relation Age of Onset  ? Heart failure Mother   ? Atrial fibrillation Mother   ? Hypertension Mother   ? Diabetes Father   ? Heart attack Father   ? Breast cancer Maternal Aunt 35  ? Breast cancer Maternal Grandmother 58  ? Breast cancer  Cousin   ?     2 mat cousins  ? Hypertension Sister   ? Diabetes Sister   ? Blindness Sister   ? Hypertension Sister   ? Arthritis Sister   ? Leukemia Paternal Aunt   ? Leukemia Paternal Uncle   ? Brain cancer Paternal Aunt   ? Other Brother 30  ?     Drowning accident  ? Liver cancer Maternal Uncle   ? Alcohol abuse Maternal Uncle   ? Prostate cancer Neg Hx   ? Kidney cancer Neg Hx   ? BRCA 1/2 Neg Hx   ? ? ?Social History:   ?Social History  ? ?Socioeconomic History  ? Marital status: Single  ?  Spouse name: Not on file  ? Number of children: 0  ? Years of education: Not on file  ? Highest education level: Associate degree: academic program  ?Occupational History  ?  Occupation: Retired  ?Tobacco Use  ? Smoking status: Never  ? Smokeless tobacco: Never  ? Tobacco comments:  ?  2bd hand smoke from father  ?Vaping Use  ? Vaping Use: Never used  ?Substance and Sexual Activity  ? Alcohol use: Not Currently  ?  Comment: rarely  ? Drug use: No  ? Sexual activity: Not Currently  ?  Birth control/protection: Post-menopausal  ?Other Topics Concern  ? Not on file  ?Social History Narrative  ? Not on file  ? ?Social Determinants of Health  ? ?Financial Resource Strain: Low Risk   ? Difficulty of Paying Living Expenses: Not hard at all  ?Food Insecurity: No Food Insecurity  ? Worried About Charity fundraiser in the Last Year: Never true  ? Ran Out of Food in the Last Year: Never true  ?Transportation Needs: No Transportation Needs  ? Lack of Transportation (Medical): No  ? Lack of Transportation (Non-Medical): No  ?Physical Activity: Inactive  ? Days of Exercise per Week: 0 days  ? Minutes of Exercise per Session: 0 min  ?Stress: Stress Concern Present  ? Feeling of Stress : Very much  ?Social Connections: Socially Isolated  ? Frequency of Communication with Friends and Family: Once a week  ? Frequency of Social Gatherings with Friends and Family: Never  ? Attends Religious Services: Never  ? Active Member of Clubs or  Organizations: Yes  ? Attends Archivist Meetings: Never  ? Marital Status: Never married  ? ? ?Additional Social History: as above ? ?Allergies:   ?Allergies  ?Allergen Reactions  ? Citalopram Nausea And

## 2021-12-05 ENCOUNTER — Encounter: Payer: Self-pay | Admitting: Psychiatry

## 2021-12-05 ENCOUNTER — Ambulatory Visit (INDEPENDENT_AMBULATORY_CARE_PROVIDER_SITE_OTHER): Payer: Medicare Other | Admitting: Psychiatry

## 2021-12-05 VITALS — BP 151/82 | HR 78 | Temp 98.1°F | Wt 165.8 lb

## 2021-12-05 DIAGNOSIS — F331 Major depressive disorder, recurrent, moderate: Secondary | ICD-10-CM | POA: Diagnosis not present

## 2021-12-05 DIAGNOSIS — F411 Generalized anxiety disorder: Secondary | ICD-10-CM

## 2021-12-05 MED ORDER — VENLAFAXINE HCL ER 37.5 MG PO CP24: 37.5000 mg | ORAL_CAPSULE | Freq: Every day | ORAL | 0 refills | Status: DC

## 2021-12-05 MED ORDER — VENLAFAXINE HCL ER 75 MG PO CP24: 75.0000 mg | ORAL_CAPSULE | Freq: Every day | ORAL | 0 refills | Status: DC

## 2021-12-05 NOTE — Patient Instructions (Signed)
Start venlafaxine 37.5 mg daily for 1 week, then 75 mg daily ?Continue lorazepam 0.5 mg daily as needed for anxiety ?Next appointment: 6/19 at 2:30, in person ?

## 2021-12-07 ENCOUNTER — Ambulatory Visit
Admission: EM | Admit: 2021-12-07 | Discharge: 2021-12-07 | Disposition: A | Discharge status: Home / Self Care | Payer: Medicare Other | Attending: Emergency Medicine | Admitting: Emergency Medicine

## 2021-12-07 ENCOUNTER — Inpatient Hospital Stay: Payer: Medicare Other | Admitting: Licensed Clinical Social Worker

## 2021-12-07 ENCOUNTER — Telehealth: Payer: Self-pay

## 2021-12-07 ENCOUNTER — Other Ambulatory Visit: Payer: Self-pay

## 2021-12-07 DIAGNOSIS — R350 Frequency of micturition: Secondary | ICD-10-CM | POA: Diagnosis not present

## 2021-12-07 DIAGNOSIS — N3001 Acute cystitis with hematuria: Secondary | ICD-10-CM | POA: Diagnosis not present

## 2021-12-07 DIAGNOSIS — R1084 Generalized abdominal pain: Secondary | ICD-10-CM

## 2021-12-07 DIAGNOSIS — C561 Malignant neoplasm of right ovary: Secondary | ICD-10-CM

## 2021-12-07 DIAGNOSIS — R309 Painful micturition, unspecified: Secondary | ICD-10-CM | POA: Diagnosis not present

## 2021-12-07 LAB — URINALYSIS, MICROSCOPIC (REFLEX): WBC, UA: >50 WBC/hpf (ref 0–5)

## 2021-12-07 LAB — URINALYSIS, ROUTINE W REFLEX MICROSCOPIC
Glucose, UA: 100 mg/dL — AB
Nitrite: POSITIVE — AB
Protein, ur: 100 mg/dL — AB
Specific Gravity, Urine: 1.01 (ref 1.005–1.030)
pH: 5 (ref 5.0–8.0)

## 2021-12-07 MED ORDER — NITROFURANTOIN MONOHYD MACRO 100 MG PO CAPS: 100.0000 mg | ORAL_CAPSULE | Freq: Two times a day (BID) | ORAL | 0 refills | Status: DC

## 2021-12-07 MED ORDER — FLUCONAZOLE 200 MG PO TABS: 200.0000 mg | ORAL_TABLET | Freq: Every day | ORAL | 0 refills | Status: DC

## 2021-12-07 MED ORDER — CIPROFLOXACIN HCL 500 MG PO TABS: 500.0000 mg | ORAL_TABLET | Freq: Two times a day (BID) | ORAL | 0 refills | Status: DC

## 2021-12-07 NOTE — ED Provider Notes (Signed)
[facility]    CSN: [id number] Arrival date & time: 12/07/21  0850      History   Chief Complaint Chief Complaint  Patient presents with   Urinary Tract Infection    HPI Carrie Roberson is a 76 y.o. female.   Patient presents today with lower abdominal pain frequency burning on urination and hematuria.  Patient was diagnosed on October 27, 2021 with UTI by PCP.  Patient was treated with Macrobid and felt as though symptoms are gone away but they have returned.  Patient has not taken anything today.  Denies any fevers nausea vomiting or diarrhea.  Patient states that she has had C. difficile in the past and is concerned for this.  Patient does have several allergies to penicillin and sulfa based medications.   Past Medical History:  Diagnosis Date   Anxiety    Arthritis    Diverticulitis    Family history of breast cancer    Family history of leukemia    GERD (gastroesophageal reflux disease)    History of ischemic colitis 09/02/2017   Hx of colonic polyp    Hypertension    H/O-PT TAKEN OFF BY PCP AS OF 2019   IBS (irritable bowel syndrome)    Ovarian cancer ([place])    Ovarian cyst    Personal history of chemotherapy 2021   6 treatments   Vaginal atrophy     Patient Active Problem List   Diagnosis Date Noted   Acute colitis 07/20/2021   Colitis 07/19/2021   Chemotherapy induced neutropenia ([place]) 05/02/2021   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   Genetic testing 11/02/2019   Weight loss, unintentional 10/16/2019   Chemotherapy induced diarrhea 10/13/2019   Orthostasis 10/13/2019   Hypokalemia 10/13/2019   Restless leg 10/13/2019   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 10/13/2019   Iron deficiency 10/02/2019   Anxiety associated with cancer diagnosis ([place]) 10/02/2019   Chemotherapy-induced nausea 10/02/2019   Thyroid nodule 10/02/2019   Hyponatremia 10/02/2019   B12 deficiency 09/18/2019   Family history of breast cancer    Family history of  leukemia    Goals of care, counseling/discussion 09/07/2019   Ovarian mass 08/19/2019   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) 08/19/2019   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HCC)    Cyst of ovary 07/15/2019   Anxiety 09/02/2017   DDD (degenerative disc disease), 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   FH: diabetes mellitus 09/02/2017   Obesity, Class I, BMI 30.0-34.9 (see actual BMI) 09/02/2017   Atypical nevus 09/02/2017   IBS (irritable bowel syndrome) 09/02/2017   Atrophic vaginitis 09/02/2017   Chronic idiopathic urticaria 09/02/2017   GERD (gastroesophageal reflux disease) 09/02/2017    Past Surgical History:  Procedure Laterality Date   ABDOMINAL HYSTERECTOMY     BREAST BIOPSY Left    needle bx/clip-neg   CERVICAL BIOPSY  W/ LOOP ELECTRODE EXCISION     CHOLECYSTECTOMY     Colonoscopoy  2007, 2008, 2011   COLONOSCOPY WITH PROPOFOL N/A 05/05/2018   Procedure: COLONOSCOPY WITH PROPOFOL;  Surgeon: Lin Landsman, MD;  Location: [facility] ENDOSCOPY;  Service: Gastroenterology;  Laterality: N/A;   FLEXIBLE SIGMOIDOSCOPY N/A 07/20/2021   Procedure: FLEXIBLE SIGMOIDOSCOPY;  Surgeon: Lesly Rubenstein, MD;  Location: [facility] ENDOSCOPY;  Service: Endoscopy;  Laterality: N/A;   HYSTERECTOMY ABDOMINAL WITH SALPINGO-OOPHORECTOMY N/A 08/19/2019   Procedure: HYSTERECTOMY ABDOMINAL WITH SALPINGO-OOPHORECTOMY WITH PELVIC WASHINGS PERITONEAL BIOPSIES AND PARA AORTIC LYMPH NODE DISSECTION;  Surgeon: [name], MD;  Location: [facility] ORS;  Service: Gynecology;  Laterality: N/A;   LEEP     OMENTECTOMY N/A 08/19/2019   Procedure: OMENTECTOMY;  Surgeon: [name], MD;  Location: [facility] ORS;  Service: Gynecology;  Laterality: N/A;   OOPHORECTOMY     PORTA CATH INSERTION N/A 09/11/2019   Procedure: PORTA CATH INSERTION;  Surgeon: Algernon Huxley, MD;  Location: [place] CV LAB;  Service: Cardiovascular;  Laterality: N/A;   TUBAL LIGATION  1999    OB  History     Gravida  0   Para  0   Term  0   Preterm  0   AB  0   Living  0      SAB  0   IAB  0   Ectopic  0   Multiple  0   Live Births  0            Home Medications    Prior to Admission medications   Medication Sig Start Date End Date Taking? Authorizing Provider  acetaminophen (TYLENOL) 500 MG tablet Take 1,000 mg by mouth every 6 (six) hours as needed (for pain.).   Yes [provider]  ciprofloxacin (CIPRO) 500 MG tablet Take 1 tablet (500 mg total) by mouth 2 (two) times daily. 12/07/21  Yes Marney Setting, NP  Cranberry 300 MG tablet Take 300 mg by mouth 2 (two) times daily.   Yes [provider]  fluticasone (FLONASE) 50 MCG/ACT nasal spray Place 1 spray into both nostrils daily. otc   Yes [provider]  lisinopril (ZESTRIL) 5 MG tablet Take 1 tablet (5 mg total) by mouth daily. 05/02/21  Yes Juline Patch, MD  LORazepam (ATIVAN) 0.5 MG tablet Take 1 tablet (0.5 mg total) by mouth every 6 (six) hours as needed (Nausea/vomiting or anxiety). 11/17/21  Yes Sindy Guadeloupe, MD  mirabegron ER (MYRBETRIQ) 50 MG TB24 tablet Take 1 tablet (50 mg total) by mouth daily. 07/04/21  Yes Vaillancourt, Samantha, PA-C  omeprazole (PRILOSEC) 10 MG capsule Take 10 mg by mouth daily.   Yes [provider]  Polyethylene Glycol 3350 (MIRALAX PO) Take by mouth.   Yes [provider]  venlafaxine XR (EFFEXOR-XR) 37.5 MG 24 hr capsule Take 1 capsule (37.5 mg total) by mouth daily with breakfast for 7 days. 12/05/21 12/12/21 Yes Norman Clay, MD  venlafaxine XR (EFFEXOR-XR) 75 MG 24 hr capsule Take 1 capsule (75 mg total) by mouth daily with breakfast. Start after completing 37.5 mg daily for one week 12/13/21 01/12/22 Yes Hisada, Elie Goody, MD  loratadine (CLARITIN) 10 MG tablet Take 10 mg by mouth daily. otc    [provider]  pregabalin (LYRICA) 75 MG capsule Take 1 capsule (75 mg total) by mouth 2 (two) times daily. Patient  not taking: Reported on 12/05/2021 01/10/21   Sindy Guadeloupe, MD    Family History Family History  Problem Relation Age of Onset   Heart failure Mother    Atrial fibrillation Mother    Hypertension Mother    Diabetes Father    Heart attack Father    Breast cancer Maternal Aunt 58   Breast cancer Maternal Grandmother 66   Breast cancer Cousin        2 mat cousins   Hypertension Sister    Diabetes Sister    Blindness Sister    Hypertension Sister    Arthritis Sister    Leukemia Paternal Aunt    Leukemia Paternal Uncle    Brain cancer Paternal  Aunt    Other Brother 56       Drowning accident   Liver cancer Maternal Uncle    Alcohol abuse Maternal Uncle    Prostate cancer Neg Hx    Kidney cancer Neg Hx    BRCA 1/2 Neg Hx     Social History Social History   Tobacco Use   Smoking status: Never   Smokeless tobacco: Never   Tobacco comments:    2bd hand smoke from father  Vaping Use   Vaping Use: Never used  Substance Use Topics   Alcohol use: Not Currently    Comment: rarely   Drug use: No     Allergies   Citalopram, Ivp dye [iodinated contrast media], Meloxicam, Adhesive [tape], Amlodipine, Ampicillin, Cefoxitin, Chlorhexidine, Estradiol, Estropipate, Olmesartan medoxomil-hctz, Sertraline, and Sulfa antibiotics   Review of Systems Review of Systems  Constitutional:  Negative for activity change and fever.  Respiratory: Negative.    Cardiovascular: Negative.   Gastrointestinal:  Positive for abdominal pain.  Genitourinary:  Positive for difficulty urinating, dysuria, flank pain, frequency and hematuria.  Neurological: Negative.     Physical Exam Triage Vital Signs ED Triage Vitals  Enc Vitals Group     BP 12/07/21 0911 (!) 157/89     Pulse Rate 12/07/21 0911 77     Resp 12/07/21 0911 18     Temp 12/07/21 0911 98.5 F (36.9 C)     Temp Source 12/07/21 0911 Oral     SpO2 12/07/21 0911 97 %     Weight 12/07/21 0907 165 lb (74.8 kg)     Height 12/07/21  0907 _0  (1.575 m)     Head Circumference --      Peak Flow --      Pain Score 12/07/21 0907 8     Pain Loc --      Pain Edu? --      Excl. in [place]? --    No data found.  Updated Vital Signs BP (!) 157/89 (BP Location: Left Arm)   Pulse 77   Temp 98.5 F (36.9 C) (Oral)   Resp 18   Ht _1  (1.575 m)   Wt 165 lb (74.8 kg)   SpO2 97%   BMI 30.18 kg/m   Visual Acuity Right Eye Distance:   Left Eye Distance:   Bilateral Distance:    Right Eye Near:   Left Eye Near:    Bilateral Near:     Physical Exam Constitutional:      Appearance: Normal appearance.  Cardiovascular:     Rate and Rhythm: Normal rate.  Pulmonary:     Effort: Pulmonary effort is normal.  Abdominal:     Tenderness: There is right CVA tenderness and left CVA tenderness.  Skin:    General: Skin is warm.  Neurological:     General: No focal deficit present.     Mental Status: She is alert.     UC Treatments / Results  Labs (all labs ordered are listed, but only abnormal results are displayed) Labs Reviewed  URINALYSIS, ROUTINE W REFLEX MICROSCOPIC - Abnormal; Notable for the following components:      Result Value   Color, Urine ORANGE (*)    APPearance HAZY (*)    Glucose, UA 100 (*)    Hgb urine dipstick MODERATE (*)    Bilirubin Urine SMALL (*)    Ketones, ur TRACE (*)    Protein, ur 100 (*)    Nitrite POSITIVE (*)  Leukocytes,Ua LARGE (*)    All other components within normal limits  URINALYSIS, MICROSCOPIC (REFLEX) - Abnormal; Notable for the following components:   Bacteria, UA MANY (*)    All other components within normal limits    EKG   Radiology No results found.  Procedures Procedures (including critical care time)  Medications Ordered in UC Medications - No data to display  Initial Impression / Assessment and Plan / UC Course  I have reviewed the triage vital signs and the nursing notes.  Pertinent labs & imaging results that were available during my care of  the patient were reviewed by me and considered in my medical decision making (see chart for details).     Discussed with patient there is concern for C. difficile with treatment of antibiotics Also discussed with patient concerns of her penicillin and sulfur allergy Patient will need to take a probiotic and eat yogurt daily to creased risk of C. difficile Patient will need to call her PCP and follow-up with care and have urinalysis rechecked We will send urine off for culture and call patient with any change in treatment Patient should take medications full dose with foods Reviewed previous chart and medical history for antibiotic usage Final Clinical Impressions(s) / UC Diagnoses   Final diagnoses:  Acute cystitis with hematuria  Generalized abdominal pain     Discharge Instructions      Patient will need to take probiotics and eat yogurt to decrease likelihood of C. difficile. Due to allergy risk and recent antibiotic usage we are limited on medications we are able to give the patient. Patient will need to call her PCP so they can closely monitor patient to prevent reoccurring C. difficile      ED Prescriptions     Medication Sig Dispense Auth. Provider   ciprofloxacin (CIPRO) 500 MG tablet Take 1 tablet (500 mg total) by mouth 2 (two) times daily. 14 tablet Marney Setting, NP      PDMP not reviewed this encounter.   Marney Setting, NP 12/07/21 1001

## 2021-12-07 NOTE — Telephone Encounter (Signed)
Spoke with patient and advised results rx sent to pharmacy by e-script  

## 2021-12-07 NOTE — Progress Notes (Signed)
[place] CSW Progress Note  Holiday representative met with patient to discuss adjustment concerns. Patient reported she had a difficult past 24 hours due to a UTI which caused her difficulty in sleeping. Patient reported she went to the urgent care and was prescribed an antibiotic which she may be allergic to and was not comfortable about taking it without speaking with her urologist.  Patient spoke with her urologist, expressed her concerns and urologist agreed to a different antibiotic until patient had the chance to see her next week.  CSW and patient discussed how it felt to advocated for herself and during a time when she was feeling nervous about the prognosis from the urgent center PA.  CSW and patient dicussed the positive outcomes form advocating for herself and compared what she did this time to what she may have done in the past.  CSW and patient also discussed the completion of a major project at home and how completing that felt.  Patient stated she felt satisfaction in what she had accomplished and has noticed a modification in the manner she and her sister are communication and co-habituating has improved. Patient will continue using skills to manage anxiety. Patient scheduled for June 1st, 2023 at 2:00PM.    Adelene Amas, LCSW

## 2021-12-07 NOTE — ED Triage Notes (Signed)
Pt c/o possible UTI. Pt was treated for a UTI on 10/27/21 and states that the symptoms returned 2 days ago.  Pt has taken an AZO.  Pt is having Urinary pain and pressure.  Pt had CDIF in December 2022 and asks if she should avoid any antibiotics.

## 2021-12-07 NOTE — Telephone Encounter (Signed)
OK to send in [facility] '100mg'$  BID x7 days as an alternative.

## 2021-12-07 NOTE — Telephone Encounter (Signed)
Pt calls triage line and states that she was seen at the [facility] for a UTI. She is concerned because she was prescribed Cipro for a suspected UTI, and she has a hx of c-diff. She states that she is going to fill the RX, but wants your recommendations as well on how to proceed. She states that she was told if she did not take the rx for Cipro she would be at risk for pyelonephritis. Of note the color of her urine on dip at there lab was orange which may suggest the patient has taken AZO prior to giving specimen. Please advise.

## 2021-12-07 NOTE — Discharge Instructions (Signed)
Patient will need to take probiotics and eat yogurt to decrease likelihood of C. difficile. Due to allergy risk and recent antibiotic usage we are limited on medications we are able to give the patient. Patient will need to call her PCP so they can closely monitor patient to prevent reoccurring C. difficile

## 2021-12-10 LAB — URINE CULTURE: Culture: >=100000 — AB

## 2021-12-11 ENCOUNTER — Other Ambulatory Visit: Payer: Self-pay | Admitting: Psychiatry

## 2021-12-11 ENCOUNTER — Telehealth: Payer: Self-pay

## 2021-12-11 DIAGNOSIS — C561 Malignant neoplasm of right ovary: Secondary | ICD-10-CM

## 2021-12-11 MED ORDER — LORAZEPAM 0.5 MG PO TABS: 0.5000 mg | ORAL_TABLET | Freq: Every day | ORAL | 0 refills | Status: DC | PRN

## 2021-12-11 NOTE — Telephone Encounter (Signed)
Ordered lorazepam 0.5 mg daily as needed for severe anxiety.

## 2021-12-11 NOTE — Telephone Encounter (Signed)
pt called left a message that she thought she had enough of the lorazepam but she doesn't can you please send in a refill to the [facility].

## 2021-12-12 ENCOUNTER — Ambulatory Visit: Payer: Medicare Other | Admitting: Family Medicine

## 2021-12-14 ENCOUNTER — Encounter: Payer: Self-pay | Admitting: Physician Assistant

## 2021-12-14 ENCOUNTER — Ambulatory Visit (INDEPENDENT_AMBULATORY_CARE_PROVIDER_SITE_OTHER): Payer: Medicare Other | Admitting: Physician Assistant

## 2021-12-14 VITALS — BP 160/84 | HR 81 | Ht 62.0 in | Wt 165.0 lb

## 2021-12-14 DIAGNOSIS — N390 Urinary tract infection, site not specified: Secondary | ICD-10-CM | POA: Diagnosis not present

## 2021-12-14 LAB — URINALYSIS, COMPLETE
Glucose, UA: NEGATIVE
Nitrite, UA: NEGATIVE
Specific Gravity, UA: 1.03 (ref 1.005–1.030)
Urobilinogen, Ur: 0.2 mg/dL (ref 0.2–1.0)
pH, UA: 6 (ref 5.0–7.5)

## 2021-12-14 LAB — MICROSCOPIC EXAMINATION

## 2021-12-14 NOTE — Progress Notes (Signed)
12/14/2021 12:09 PM   Carrie Roberson [birth year] [id number]  CC: Chief Complaint  Patient presents with   Recurrent UTI   HPI: Carrie Roberson is a 76 y.o. 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   [facility]21 with reports of lower abdominal pain, frequency, dysuria, and hematuria.  Her UA at that time was notable for pyuria, microscopic hematuria, and bacteriuria.  She was initially prescribed Cipro, however she was hesitant to take this because she was recently hospitalized for 2-1/2 days with C. difficile.  She contacted our office for further assistance and I prescribed her empiric Macrobid twice daily x7 days as an alternative.  Her urine culture ultimately finalized with pansensitive E. coli.  Today she reports her urinary symptoms have largely resolved on Macrobid, which she completed last night.  She reports some occasional lower abdominal pressure but no dysuria.  In-office UA today positive for 1+ bilirubin, trace ketones, trace lysed blood, 2+ protein, and trace leukocyte esterase; urine microscopy with 6-10 WBCs/HPF and moderate bacteria.  Sample is too small to draw a culture.  PMH: Past Medical History:  Diagnosis Date   Anxiety    Arthritis    Diverticulitis    Family history of breast cancer    Family history of leukemia    GERD (gastroesophageal reflux disease)    History of ischemic colitis 09/02/2017   Hx of colonic polyp    Hypertension    H/O-PT TAKEN OFF BY PCP AS OF 2019   IBS (irritable bowel syndrome)    Ovarian cancer (HCC)    Ovarian cyst    Personal history of chemotherapy 2021   6 treatments   Vaginal atrophy     Surgical History: Past Surgical History:  Procedure Laterality Date   ABDOMINAL  HYSTERECTOMY     BREAST BIOPSY Left    needle bx/clip-neg   CERVICAL BIOPSY  W/ LOOP ELECTRODE EXCISION     CHOLECYSTECTOMY     Colonoscopoy  2007, 2008, 2011   COLONOSCOPY WITH PROPOFOL N/A 05/05/2018   Procedure: COLONOSCOPY WITH PROPOFOL;  Surgeon: Toney Reil, MD;  Location: [facility] ENDOSCOPY;  Service: Gastroenterology;  Laterality: N/A;   FLEXIBLE SIGMOIDOSCOPY N/A 07/20/2021   Procedure: FLEXIBLE SIGMOIDOSCOPY;  Surgeon: Regis Bill, MD;  Location: [facility] ENDOSCOPY;  Service: Endoscopy;  Laterality: N/A;   HYSTERECTOMY ABDOMINAL WITH SALPINGO-OOPHORECTOMY N/A 08/19/2019   Procedure: HYSTERECTOMY ABDOMINAL WITH SALPINGO-OOPHORECTOMY WITH PELVIC WASHINGS PERITONEAL BIOPSIES AND PARA AORTIC LYMPH NODE DISSECTION;  Surgeon: Vena Austria, MD;  Location: [facility] ORS;  Service: Gynecology;  Laterality: N/A;   LEEP     OMENTECTOMY N/A 08/19/2019   Procedure: OMENTECTOMY;  Surgeon: Vena Austria, MD;  Location: [facility] ORS;  Service: Gynecology;  Laterality: N/A;   OOPHORECTOMY     PORTA CATH INSERTION N/A 09/11/2019   Procedure: PORTA CATH INSERTION;  Surgeon: Annice Needy, MD;  Location: [facility]VASIVE CV LAB;  Service: Cardiovascular;  Laterality: N/A;   TUBAL LIGATION  1999    Home Medications:  Allergies as of 12/14/2021       Reactions   Citalopram Nausea And Vomiting   Ivp Dye [iodinated Contrast Media] Hives, Swelling   hives   Meloxicam    unknown   Adhesive [tape] Rash   Amlodipine Rash  Ampicillin Rash   Did it involve swelling of the face/tongue/throat, SOB, or low BP? No Did it involve sudden or severe rash/hives, skin peeling, or any reaction on the inside of your mouth or nose? Yes Did you need to seek medical attention at a hospital or doctor's office? Yes When did it last happen?       10 + years If all above answers are "NO", may proceed with cephalosporin use.   Cefoxitin Rash   Chlorhexidine Rash   Chloraprep Scrub Teal   Estradiol Rash    Estropipate Rash   Olmesartan Medoxomil-hctz Rash   Sertraline Rash      Sulfa Antibiotics Rash        Medication List        Accurate as of Dec 14, 2021 12:09 PM. If you have any questions, ask your nurse or doctor.          STOP taking these medications    ciprofloxacin 500 MG tablet Commonly known as: CIPRO Stopped by: Carman Ching, PA-C   fluconazole 200 MG tablet Commonly known as: DIFLUCAN Stopped by: Carman Ching, PA-C   nitrofurantoin (macrocrystal-monohydrate) 100 MG capsule Commonly known as: Macrobid Stopped by: Carman Ching, PA-C       TAKE these medications    acetaminophen 500 MG tablet Commonly known as: TYLENOL Take 1,000 mg by mouth every 6 (six) hours as needed (for pain.).   Cranberry 300 MG tablet Take 300 mg by mouth 2 (two) times daily.   fluticasone 50 MCG/ACT nasal spray Commonly known as: FLONASE Place 1 spray into both nostrils daily. otc   lisinopril 5 MG tablet Commonly known as: ZESTRIL Take 1 tablet (5 mg total) by mouth daily.   loratadine 10 MG tablet Commonly known as: CLARITIN Take 10 mg by mouth daily. otc   LORazepam 0.5 MG tablet Commonly known as: ATIVAN Take 1 tablet (0.5 mg total) by mouth daily as needed for anxiety (for severe anxiety).   mirabegron ER 50 MG Tb24 tablet Commonly known as: Myrbetriq Take 1 tablet (50 mg total) by mouth daily.   MIRALAX PO Take by mouth.   omeprazole 10 MG capsule Commonly known as: PRILOSEC Take 10 mg by mouth daily.   pregabalin 75 MG capsule Commonly known as: Lyrica Take 1 capsule (75 mg total) by mouth 2 (two) times daily.   venlafaxine XR 37.5 MG 24 hr capsule Commonly known as: EFFEXOR-XR Take 1 capsule (37.5 mg total) by mouth daily with breakfast for 7 days.   venlafaxine XR 75 MG 24 hr capsule Commonly known as: EFFEXOR-XR Take 1 capsule (75 mg total) by mouth daily with breakfast. Start after completing 37.5 mg daily for one  week        Allergies:  Allergies  Allergen Reactions   Citalopram Nausea And Vomiting   Ivp Dye [Iodinated Contrast Media] Hives and Swelling    hives   Meloxicam     unknown   Adhesive [Tape] Rash   Amlodipine Rash   Ampicillin Rash    Did it involve swelling of the face/tongue/throat, SOB, or low BP? No Did it involve sudden or severe rash/hives, skin peeling, or any reaction on the inside of your mouth or nose? Yes Did you need to seek medical attention at a hospital or doctor's office? Yes When did it last happen?       10 + years If all above answers are "NO", may proceed with cephalosporin use.   Cefoxitin Rash  Chlorhexidine Rash    Chloraprep Scrub Teal   Estradiol Rash   Estropipate Rash   Olmesartan Medoxomil-Hctz Rash   Sertraline Rash        Sulfa Antibiotics Rash    Family History: Family History  Problem Relation Age of Onset   Heart failure Mother    Atrial fibrillation Mother    Hypertension Mother    Diabetes Father    Heart attack Father    Breast cancer Maternal Aunt 20   Breast cancer Maternal Grandmother 14   Breast cancer Cousin        2 mat cousins   Hypertension Sister    Diabetes Sister    Blindness Sister    Hypertension Sister    Arthritis Sister    Leukemia Paternal Aunt    Leukemia Paternal Uncle    Brain cancer Paternal Aunt    Other Brother 8       Drowning accident   Liver cancer Maternal Uncle    Alcohol abuse Maternal Uncle    Prostate cancer Neg Hx    Kidney cancer Neg Hx    BRCA 1/2 Neg Hx     Social History:   reports that she has never smoked. She has never used smokeless tobacco. She reports that she does not currently use alcohol. She reports that she does not use drugs.  Physical Exam: BP (!) 160/84   Pulse 81   Ht $R'5\' 2"'rO$  (1.575 m)   Wt 165 lb (74.8 kg)   BMI 30.18 kg/m   Constitutional:  Alert and oriented, no acute distress, nontoxic appearing HEENT: [place], AT Cardiovascular: No clubbing, cyanosis, or  edema Respiratory: Normal respiratory effort, no increased work of breathing Skin: No rashes, bruises or suspicious lesions Neurologic: Grossly intact, no focal deficits, moving all 4 extremities Psychiatric: Normal mood and affect  Laboratory Data: Results for orders placed or performed in visit on 12/14/21  Microscopic Examination   Urine  Result Value Ref Range   WBC, UA 6-10 (A) 0 - 5 /hpf   RBC 0-2 0 - 2 /hpf   Epithelial Cells (non renal) 0-10 0 - 10 /hpf   Casts Present (A) None seen /lpf   Cast Type Hyaline casts N/A   Bacteria, UA Moderate (A) None seen/Few  Urinalysis, Complete  Result Value Ref Range   Specific Gravity, UA 1.030 1.005 - 1.030   pH, UA 6.0 5.0 - 7.5   Color, UA Yellow Yellow   Appearance Ur Hazy (A) Clear   Leukocytes,UA Trace (A) Negative   Protein,UA 2+ (A) Negative/Trace   Glucose, UA Negative Negative   Ketones, UA Trace (A) Negative   RBC, UA Trace (A) Negative   Bilirubin, UA 1+ (A) Negative   Urobilinogen, Ur 0.2 0.2 - 1.0 mg/dL   Nitrite, UA Negative Negative   Microscopic Examination See below:    Assessment & Plan:   1. Recurrent UTI UA today is significantly improved over prior after taking 7 days of culture appropriate Macrobid, and I do share her concerns about C. difficile given her recent hospitalization.  I offered to repeat a urine culture today for reassurance, however we discussed that her intermittent lower abdominal pressure is more likely to represent some residual bladder inflammation and worsened OAB in the setting of her recent infection.  Patient was able to provide an additional urine sample before leaving clinic today for culture. - Urinalysis, Complete - CULTURE, URINE COMPREHENSIVE  Return for Will call with results.  Debroah Loop,  PA-C  [facility] [address] [place], [place] [address] [phone]

## 2021-12-15 ENCOUNTER — Telehealth: Payer: Self-pay

## 2021-12-15 NOTE — Telephone Encounter (Signed)
Patient left message on triage line stating she is having pressure and frequency with urination and would like to know if there is anything to take while waiting on culture results.  Attempted to return call no answer, left detailed message per DPR; Ok to utilize AZO OTC for symptom relief

## 2021-12-17 LAB — CULTURE, URINE COMPREHENSIVE

## 2021-12-20 ENCOUNTER — Inpatient Hospital Stay (HOSPITAL_BASED_OUTPATIENT_CLINIC_OR_DEPARTMENT_OTHER): Payer: Medicare Other | Admitting: Nurse Practitioner

## 2021-12-20 ENCOUNTER — Inpatient Hospital Stay: Payer: Medicare Other

## 2021-12-20 VITALS — BP 145/75 | HR 72 | Temp 97.8°F | Resp 19 | Wt 163.4 lb

## 2021-12-20 DIAGNOSIS — Z08 Encounter for follow-up examination after completed treatment for malignant neoplasm: Secondary | ICD-10-CM | POA: Diagnosis not present

## 2021-12-20 DIAGNOSIS — Z8543 Personal history of malignant neoplasm of ovary: Secondary | ICD-10-CM

## 2021-12-20 DIAGNOSIS — E041 Nontoxic single thyroid nodule: Secondary | ICD-10-CM | POA: Diagnosis not present

## 2021-12-20 DIAGNOSIS — Z806 Family history of leukemia: Secondary | ICD-10-CM | POA: Diagnosis not present

## 2021-12-20 DIAGNOSIS — Z90710 Acquired absence of both cervix and uterus: Secondary | ICD-10-CM | POA: Diagnosis not present

## 2021-12-20 DIAGNOSIS — M5136 Other intervertebral disc degeneration, lumbar region: Secondary | ICD-10-CM | POA: Diagnosis not present

## 2021-12-20 DIAGNOSIS — Z95828 Presence of other vascular implants and grafts: Secondary | ICD-10-CM | POA: Diagnosis not present

## 2021-12-20 DIAGNOSIS — I10 Essential (primary) hypertension: Secondary | ICD-10-CM | POA: Diagnosis not present

## 2021-12-20 DIAGNOSIS — Z808 Family history of malignant neoplasm of other organs or systems: Secondary | ICD-10-CM | POA: Diagnosis not present

## 2021-12-20 DIAGNOSIS — R918 Other nonspecific abnormal finding of lung field: Secondary | ICD-10-CM | POA: Diagnosis not present

## 2021-12-20 DIAGNOSIS — C561 Malignant neoplasm of right ovary: Secondary | ICD-10-CM | POA: Diagnosis not present

## 2021-12-20 DIAGNOSIS — Z803 Family history of malignant neoplasm of breast: Secondary | ICD-10-CM | POA: Diagnosis not present

## 2021-12-20 LAB — COMPREHENSIVE METABOLIC PANEL
ALT: 20 U/L (ref 0–44)
AST: 21 U/L (ref 15–41)
Albumin: 3.9 g/dL (ref 3.5–5.0)
Alkaline Phosphatase: 76 U/L (ref 38–126)
Anion gap: 6 (ref 5–15)
BUN: 9 mg/dL (ref 8–23)
CO2: 26 mmol/L (ref 22–32)
Calcium: 9.9 mg/dL (ref 8.9–10.3)
Chloride: 101 mmol/L (ref 98–111)
Creatinine, Ser: 0.78 mg/dL (ref 0.44–1.00)
GFR, Estimated: >60 mL/min (ref >60)
Glucose, Bld: 98 mg/dL (ref 70–99)
Potassium: 4.1 mmol/L (ref 3.5–5.1)
Sodium: 133 mmol/L — ABNORMAL LOW (ref 135–145)
Total Bilirubin: 0.3 mg/dL (ref 0.3–1.2)
Total Protein: 6.7 g/dL (ref 6.5–8.1)

## 2021-12-20 LAB — FERRITIN: Ferritin: 76 ng/mL (ref 11–307)

## 2021-12-20 LAB — CBC WITH DIFFERENTIAL/PLATELET
Abs Immature Granulocytes: 0 10*3/uL (ref 0.00–0.07)
Basophils Absolute: 0 10*3/uL (ref 0.0–0.1)
Basophils Relative: 1 %
Eosinophils Absolute: 0.1 10*3/uL (ref 0.0–0.5)
Eosinophils Relative: 2 %
HCT: 45.3 % (ref 36.0–46.0)
Hemoglobin: 15.4 g/dL — ABNORMAL HIGH (ref 12.0–15.0)
Immature Granulocytes: 0 %
Lymphocytes Relative: 43 %
Lymphs Abs: 1.4 10*3/uL (ref 0.7–4.0)
MCH: 31.2 pg (ref 26.0–34.0)
MCHC: 34 g/dL (ref 30.0–36.0)
MCV: 91.7 fL (ref 80.0–100.0)
Monocytes Absolute: 0.5 10*3/uL (ref 0.1–1.0)
Monocytes Relative: 15 %
Neutro Abs: 1.3 10*3/uL — ABNORMAL LOW (ref 1.7–7.7)
Neutrophils Relative %: 39 %
Platelets: 258 10*3/uL (ref 150–400)
RBC: 4.94 MIL/uL (ref 3.87–5.11)
RDW: 12.5 % (ref 11.5–15.5)
WBC: 3.3 10*3/uL — ABNORMAL LOW (ref 4.0–10.5)
nRBC: 0 % (ref 0.0–0.2)

## 2021-12-20 LAB — IRON AND TIBC
Iron: 88 ug/dL (ref 28–170)
Saturation Ratios: 25 % (ref 10.4–31.8)
TIBC: 350 ug/dL (ref 250–450)
UIBC: 262 ug/dL

## 2021-12-20 MED ORDER — SODIUM CHLORIDE 0.9% FLUSH
10.0000 mL | Freq: Once | INTRAVENOUS | Status: AC
  Administered 2021-12-20: 10 mL via INTRAVENOUS
  Filled 2021-12-20: qty 10

## 2021-12-20 MED ORDER — HEPARIN SOD (PORK) LOCK FLUSH 100 UNIT/ML IV SOLN
500.0000 [IU] | Freq: Once | INTRAVENOUS | Status: AC
  Administered 2021-12-20: 500 [IU] via INTRAVENOUS
  Filled 2021-12-20: qty 5

## 2021-12-20 NOTE — Progress Notes (Signed)
Gynecologic Oncology Interval Visit  [facility]  Telephone:([phone] [phone]  Patient Care Team: Juline Patch, MD as PCP - General (Family Medicine) Lin Landsman, MD as Consulting Physician (Gastroenterology) Clent Jacks, RN as Oncology Nurse Navigator Mellody Drown, MD as Referring Physician (Obstetrics) Sindy Guadeloupe, MD as Consulting Physician (Oncology) [name], MD as Consulting Physician (Obstetrics and Gynecology)   Name of the patient: Carrie Roberson  [id number]  1945/08/23   Date of visit: 12/20/2021  Referring Provider: Dr. 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 G0P0 female seen in consultation from Dr. 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   CA 125 has been followed though not significantly elevated at diagnosis:  11/17/21 8.2 11/17/20  8.4 06/21/20 8.8 At diagnosis:  10.3  Mammograms uptodate. She experienced RLQ abdominal pain and underwent CT A/P WO contrast in December 2022 which showed possible inflammatory/infectious colitis, sigmoid diverticulosis. No adnexal masses noted or lymphadenopathy. Colonoscopy was planned for July 2023 but patient cancelled.   She continues to feel well and denies complaints. She has started to see counselor and psychiatry. Continues to live with her sister.    Gynecologic Oncology History:  [facility] concerning a midline pelvic mass.  Initial presentation was prompted by frequent urination and bladder pessure. PVR was checked and noted to be normal but bladder scan showed residual fluid collection.  TVUS was ordered and revealed a 10.2 x 9.2 x 6.3 cm midline cystic pelvic  mass. Symptoms initially started in August of 2020. She had previously been followed asymptomatic 1.42 x 1.58cm simple left ovarian cyst on 11/18/2017.     Appearance on 06/26/2019 ultrasound was notable simple cyst, normal doppler flow and no free fluid. The patient endorses associated symptoms of pelvic pressure.  The patient denies associated symptoms of  early satiety, weight gain, weight loss, night sweats, vaginal bleeding and nausea.  There is not a notable family history of ovarian cancer, uterine cancer, breast cancer, or colon cancer.  06/07/2018 Pelvic US Findings:  The uterus is anteverted and measures 6.8x3x2.7cm. Echo texture is homogenous without evidence of focal masses. The Endometrium measures 4.19 mm.   Right Ovary measures 4.7 cm3. It is normal in appearance. Left Ovary measures 5.2 cm3. It has a hypoechoic cyst = 1.4x1.53m Survey of the adnexa demonstrates no adnexal masses. There is no free fluid in the cul de sac. IMPRESSION:  1. Left Ovary measures 5.2 cm3. It has a hypoechoic cyst = 1.4x1.49m(possibility of hemorrhagic cyst vs others) 2. Cervical canal appears irregular d/t LEEP procedure  06/26/2019 Abdominal USKoreaFINDINGS: No ascites is demonstrated. There is a cystic midline pelvic mass which lies superior to the bladder, measuring 10.2 x 9.2 x 6.3 cm. IMPRESSION: 1. No ascites. 2. Large cystic mass in the pelvis, not demonstrated on previous ultrasounds. This is suspicious for ovarian cystic neoplasm. Further evaluation recommended with dedicated pelvic ultrasound and/or abdominopelvic CT.  07/01/2019 MRI Reproductive: Uterus: Measures 4.3 x 2.0 by 5.9 cm. Scattered nabothian cysts identified. No mass.  Right ovary measures the 10.5 x 6.8 by 9.0 cm (volume = 340 cm^3). There is a large cystic mass arising from the right ovary which has a  maximum dimension of 10.8 cm. An eccentric, enhancing mural nodule is identified measuring 2.6 x 1.2 cm, image 50/10. No  internal septation. Left ovary measures 2.9 x 2.1 by 1.8 cm. (Volume = 5.7 cm^3). This contains several small cyst which measure up to 1.4 cm.  06/29/2019 labs Cancer Antigen (CA) 125 0.0 - 38.1 U/mL 10.3   Comment: [employer] Electrochemiluminescence Immunoassay (ECLIA)  Values obtained with different assay methods or kits cannot be  used interchangeably.  Results cannot be interpreted as absolute  evidence of the presence or absence of malignant disease.   HE4 0.0 - 96.9 pmol/L 68.8   Comment: [employer] Electrochemiluminescence Immunoassay (ECLIA)  Values obtained with different assay methods or kits cannot be  used interchangeably.  Results cannot be interpreted as absolute  evidence of the presence or absence of malignant disease.       Postmenopausal ROMA See below 1.21    ROMA calculator 12%  Of note prior LEEP 2002 (negative pathology per patient) last pap 07/26/2014 NILM.   On 08/19/19 she under TAH-BSO with with peritoneal biopsies, pelvic/aortic lymph node dissection, pelvic washings, and omentectomy with Dr. Fransisca Connors and Dr. Georgianne Fick at [facility].  Uneventful recovery.    Pathology:  DIAGNOSIS:  A.  FALLOPIAN TUBE AND OVARY, RIGHT; RIGHT SALPINGO-OOPHORECTOMY:  - HIGH-GRADE SEROUS CARCINOMA OF THE OVARY.  - ANGIOLYMPHATIC INVASION IS PRESENT.  - SEE CANCER SUMMARY BELOW.  - FALLOPIAN TUBE WITH BENIGN PARATUBAL CYSTS.   B.  FALLOPIAN TUBE AND OVARY, LEFT; LEFT SALPINGO-OOPHORECTOMY:  - OVARY WITH SEROUS CYSTADENOMA MEASURING 1.5 CM AND CYSTIC FOLLICLES.  - FALLOPIAN TUBE WITH BENIGN PARATUBAL CYSTS.  - NEGATIVE FOR ATYPIA AND MALIGNANCY.   C.  PERITONEAL STONE; EXCISION:  - STONE, 1.7 CM (GROSS ONLY).   D. UTERUS WITH CERVIX; TOTAL HYSTERECTOMY:  - CERVIX WITH ENDOCERVICAL POLYP MEASURING 0.5 CM, NABOTHIAN CYSTS, AND  TUNNEL CLUSTERS.  - INACTIVE ENDOMETRIUM WITH SMALL ENDOMETRIAL POLYP.  - MYOMETRIUM WITH ADENOMYOSIS AND INTRAMURAL LEIOMYOMA MEASURING 0.7  CM.  - NEGATIVE FOR ATYPIA AND MALIGNANCY.   E.  RIGHT GUTTER; BIOPSY:  - BENIGN MESOTHELIAL LINED FIBROADIPOSE TISSUE.  - NEGATIVE FOR MALIGNANCY.   F.  BLADDER FLAP PERITONEUM; BIOPSY:  - BENIGN FIBROADIPOSE TISSUE.  - NEGATIVE FOR MALIGNANCY.   G.  LYMPH NODE, RIGHT COMMON ILIAC; EXCISION:  - THREE LYMPH NODES NEGATIVE FOR MALIGNANCY (0/3).   H.  LYMPH NODE, RIGHT PELVIC; EXCISION:  - ONE LYMPH NODE NEGATIVE FOR MALIGNANCY (0/1).   I.  OMENTUM; OMENTECTOMY:  - BENIGN FIBROADIPOSE TISSUE.  - NEGATIVE FOR MALIGNANCY.   J.  LYMPH NODE, RIGHT AORTIC; EXCISION:  - ONE LYMPH NODE NEGATIVE FOR MALIGNANCY (0/1).  DIAGNOSIS:  A. PELVIC WASHINGS:  - NEGATIVE FOR MALIGNANCY.  - ERYTHROCYTES AND REACTIVE MESOTHELIAL CELLS  Pathologic Staging: FIGO 1C2  Post op course has been uncomplicated. She received her second covid 19 vaccine. She received lovenox for 28 day course.    09/29/2019 CT C/A/P IMPRESSION: 1. Suspected thyroid nodule along the inferior isthmus margin of the thyroid gland along with a separate anterior mediastinal fairly high density mass with punctate calcifications. 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 Thyroid scintigraphy should be considered to further characterize these lesions; if negative on scintigraphy then further workup to exclude the possibility of lymphoma or thymic tumor would be suggested. 2. Contrast medium in the distal esophagus suggesting reflux or dysmotility. 3. Several tiny  pulmonary nodules are present, 3 mm in average diameter or less. No follow-up needed if patient is low-risk (and has no known or suspected primary neoplasm). Non-contrast chest CT can be considered in 12 months if patient is high-risk.  4. Aortoiliac atherosclerotic vascular disease. 5. Multilevel lumbar degenerative disc disease. 6. Prior laparotomy with some faint  stranding along the anterior omentum in the immediate vicinity of the laparotomy site, likely from benign scarring.  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  Genetic testing for germline BRCA1/2 mutations negative.   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   Genetic testing 11/02/2019   Weight loss, unintentional 10/16/2019   Chemotherapy induced diarrhea 10/13/2019   Orthostasis 10/13/2019   Hypokalemia 10/13/2019   Restless leg 10/13/2019   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 10/13/2019   Iron deficiency 10/02/2019   Anxiety associated with cancer diagnosis ([place]) 10/02/2019   Chemotherapy-induced nausea 10/02/2019   Thyroid nodule 10/02/2019   Hyponatremia 10/02/2019   B12 deficiency 09/18/2019   Family history of breast cancer    Family history of leukemia    Goals of care, counseling/discussion 09/07/2019   Ovarian mass 08/19/2019   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) 08/19/2019   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HCC)    Cyst of ovary 07/15/2019   Anxiety 09/02/2017   DDD (degenerative disc disease), 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   FH: diabetes mellitus 09/02/2017   Obesity, Class I, BMI 30.0-34.9 (see actual BMI) 09/02/2017   Atypical nevus 09/02/2017   IBS (irritable bowel syndrome) 09/02/2017   Atrophic vaginitis 09/02/2017   Chronic idiopathic urticaria 09/02/2017   GERD (gastroesophageal reflux disease) 09/02/2017    Past Medical History:  Past Medical History:  Diagnosis Date   Anxiety    Arthritis    Diverticulitis    Family history of breast cancer    Family history of leukemia    GERD  (gastroesophageal reflux disease)    History of ischemic colitis 09/02/2017   Hx of colonic polyp    Hypertension    H/O-PT TAKEN OFF BY PCP AS OF 2019   IBS (irritable bowel syndrome)    Ovarian cancer ([place])    Ovarian cyst    Personal history of chemotherapy 2021   6 treatments   Vaginal atrophy     Past Surgical History: Past Surgical History:  Procedure Laterality Date   ABDOMINAL HYSTERECTOMY     BREAST BIOPSY Left    needle bx/clip-neg   CERVICAL BIOPSY  W/ LOOP ELECTRODE EXCISION     CHOLECYSTECTOMY     Colonoscopoy  2007, 2008, 2011   COLONOSCOPY WITH PROPOFOL N/A 05/05/2018   Procedure: COLONOSCOPY WITH PROPOFOL;  Surgeon: Lin Landsman, MD;  Location: [facility] ENDOSCOPY;  Service: Gastroenterology;  Laterality: N/A;   FLEXIBLE SIGMOIDOSCOPY N/A 07/20/2021   Procedure: FLEXIBLE SIGMOIDOSCOPY;  Surgeon: Lesly Rubenstein, MD;  Location: [facility] ENDOSCOPY;  Service: Endoscopy;  Laterality: N/A;   HYSTERECTOMY ABDOMINAL WITH SALPINGO-OOPHORECTOMY N/A 08/19/2019   Procedure: HYSTERECTOMY ABDOMINAL WITH SALPINGO-OOPHORECTOMY WITH PELVIC WASHINGS PERITONEAL BIOPSIES AND PARA AORTIC LYMPH NODE DISSECTION;  Surgeon: [name], MD;  Location: [facility] ORS;  Service: Gynecology;  Laterality: N/A;   LEEP     OMENTECTOMY N/A 08/19/2019   Procedure: OMENTECTOMY;  Surgeon: [name], MD;  Location: [facility] ORS;  Service: Gynecology;  Laterality: N/A;   OOPHORECTOMY     PORTA CATH INSERTION N/A 09/11/2019   Procedure: PORTA CATH INSERTION;  Surgeon: Algernon Huxley, MD;  Location: [place] [facility] LAB;  Service: Cardiovascular;  Laterality: N/A;   TUBAL LIGATION  1999    Past Gynecologic History:  Menarche: 9 Last Menstrual Period: menopausal History of Abnormal pap: No Contraception: TAH-SBO (07/2019); tubal ligation (1999)  OB History:  OB History  Gravida Para Term Preterm AB Living  0 0 0 0 0 0  SAB IAB Ectopic Multiple Live Births  0 0 0 0 0    Family  History: Family History  Problem Relation Age of Onset   Heart failure Mother    Atrial fibrillation Mother    Hypertension Mother    Diabetes Father    Heart attack Father    Breast cancer Maternal Aunt 48   Breast cancer Maternal Grandmother 70   Breast cancer Cousin        2 mat cousins   Hypertension Sister    Diabetes Sister    Blindness Sister    Hypertension Sister    Arthritis Sister    Leukemia Paternal Aunt    Leukemia Paternal Uncle    Brain cancer Paternal Aunt    Other Brother 68       Drowning accident   Liver cancer Maternal Uncle    Alcohol abuse Maternal Uncle    Prostate cancer Neg Hx    Kidney cancer Neg Hx    BRCA 1/2 Neg Hx    Social History: Retired  Science writer History   Socioeconomic History   Marital status: Single    Spouse name: Not on file   Number of children: 0   Years of education: Not on file   Highest education level: Associate degree: academic program  Occupational History   Occupation: Retired  Tobacco Use   Smoking status: Never   Smokeless tobacco: Never   Tobacco comments:    2bd hand smoke from father  Vaping Use   Vaping Use: Never used  Substance and Sexual Activity   Alcohol use: Not Currently    Comment: rarely   Drug use: No   Sexual activity: Not Currently    Birth control/protection: Post-menopausal  Other Topics Concern   Not on file  Social History Narrative   Not on file   Social Determinants of Health   Financial Resource Strain: Low Risk    Difficulty of Paying Living Expenses: Not hard at all  Food Insecurity: No Food Insecurity   Worried About Charity fundraiser in the Last Year: Never true   [place] in the Last Year: Never true  Transportation Needs: No Transportation Needs   Lack of Transportation (Medical): No   Lack of Transportation (Non-Medical): No  Physical Activity: Inactive   Days of Exercise per Week: 0 days   Minutes of Exercise per Session: 0 min  Stress: Stress Concern Present    Feeling of Stress : Very much  Social Connections: Socially Isolated   Frequency of Communication with Friends and Family: Once a week   Frequency of Social Gatherings with Friends and Family: Never   Attends Religious Services: Never   Marine scientist or Organizations: Yes   Attends [employer]  or Organization Meetings: Never   Marital Status: Never married  Human resources officer Violence: Not At Risk   Fear of Current or Ex-Partner: No   Emotionally Abused: No   Physically Abused: No   Sexually Abused: No   Allergies: Allergies  Allergen Reactions   Citalopram Nausea And Vomiting   Ivp Dye [Iodinated Contrast Media] Hives and Swelling    hives   Meloxicam     unknown   Adhesive [Tape] Rash   Amlodipine Rash   Ampicillin Rash    Did it involve swelling of the face/tongue/throat, SOB, or low BP? No Did it involve sudden or severe rash/hives, skin peeling, or any reaction on the inside of your mouth or nose? Yes Did you need to seek medical attention at a hospital or doctor's office? Yes When did it last happen?       10 + years If all above answers are "NO", may proceed with cephalosporin use.   Cefoxitin Rash   Chlorhexidine Rash    Chloraprep Scrub Teal   Estradiol Rash   Estropipate Rash   Olmesartan Medoxomil-Hctz Rash   Sertraline Rash        Sulfa Antibiotics Rash   Current Medications: Current Outpatient Medications  Medication Sig Dispense Refill   acetaminophen (TYLENOL) 500 MG tablet Take 1,000 mg by mouth every 6 (six) hours as needed (for pain.).     Cranberry 300 MG tablet Take 300 mg by mouth 2 (two) times daily.     fluticasone (FLONASE) 50 MCG/ACT nasal spray Place 1 spray into both nostrils daily. otc     lisinopril (ZESTRIL) 5 MG tablet Take 1 tablet (5 mg total) by mouth daily. 90 tablet 1   loratadine (CLARITIN) 10 MG tablet Take 10 mg by mouth daily. otc     LORazepam (ATIVAN) 0.5 MG tablet Take 1 tablet (0.5 mg total) by mouth daily as needed for  anxiety (for severe anxiety). 30 tablet 0   mirabegron ER (MYRBETRIQ) 50 MG TB24 tablet Take 1 tablet (50 mg total) by mouth daily. 30 tablet 11   omeprazole (PRILOSEC) 10 MG capsule Take 10 mg by mouth daily.     Polyethylene Glycol 3350 (MIRALAX PO) Take by mouth.     pregabalin (LYRICA) 75 MG capsule Take 1 capsule (75 mg total) by mouth 2 (two) times daily. 60 capsule 3   venlafaxine XR (EFFEXOR-XR) 37.5 MG 24 hr capsule Take 1 capsule (37.5 mg total) by mouth daily with breakfast for 7 days. 7 capsule 0   venlafaxine XR (EFFEXOR-XR) 75 MG 24 hr capsule Take 1 capsule (75 mg total) by mouth daily with breakfast. Start after completing 37.5 mg daily for one week 30 capsule 0   No current facility-administered medications for this visit.   Facility-Administered Medications Ordered in Other Visits  Medication Dose Route Frequency Provider Last Rate Last Admin   sodium chloride flush (NS) 0.9 % injection 10 mL  10 mL Intravenous PRN Sindy Guadeloupe, MD   10 mL at 01/05/20 0750     Review of Systems General:  no complaints Skin: no complaints Eyes: no complaints HEENT: no complaints Breasts: no complaints Pulmonary: no complaints Cardiac: no complaints Gastrointestinal: no complaints Genitourinary/Sexual: no complaints Ob/Gyn: no complaints Musculoskeletal: no complaints Hematology: no complaints Neurologic/Psych: no complaints   Objective:  Physical Examination:  Vitals:   12/20/21 0931  BP: (!) 145/75  Pulse: 72  Resp: 19  Temp: 97.8 F (36.6 C)  SpO2: 95%  Weight: 163  lb 6.4 oz (74.1 kg)     ECOG Performance Status: 0 - Asymptomatic  GENERAL: Patient is a well appearing female in no acute distress HEENT:  Sclera clear. Anicteric NODES:  Negative axillary, supraclavicular, inguinal lymph node survery LUNGS:  Clear to auscultation bilaterally.   HEART:  Regular rate and rhythm.  ABDOMEN:  Soft, nontender.  No hernias, incisions well healed. No masses or  ascites EXTREMITIES:  No peripheral edema. Atraumatic. No cyanosis SKIN:  Clear with no obvious rashes or skin changes.  NEURO:  Nonfocal. Well oriented.  Appropriate affect.  PELVIC: exam chaperoned by CMA.   Vulva: normal appearing vulva with no masses, tenderness or lesions; Vagina: well healed, vaginal cuff intact. Some agglutination at upper vagina but able to break down manually.  Bimanual: normal. Rectovaginal: deferred.   Radiologic Imaging: No imaging on site today.     Assessment:  Carrie Roberson is a 76 y.o. 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  Finished 6 cycles of carbo/taxol chemotherapy with Neulasta 6/21. NED.   CA125/HE4 not elevated at diagnosis but has been followed.   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 Stable.   Germline genetic testing negative for BRCA mutations.  Port-a-cath- flushed today.   Medical co-morbidities complicating care: HTN, obesity and prior abdominal surgery (BTL and cholecystectomy). Body mass index is 29.89 kg/m.   Plan:   Problem List Items Addressed This Visit   None  PARP inhibitor maintenance therapy not indicated for stage I disease (even if she had a BRCA mutation).  Does not need somatic tumor testing, as PARP inhibitor maintenance not approved for stage I disease.   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   Continue every 4 month surveillance alternating between gyn-onc and gynecology. Patient prefers to see Dr. Georgianne Fick and will be traveling to his new practice in [facility]. We will see her back in 8 months.   She can have port-a-cath removed at any time and will notify clinic if she wishes to have this removed. Otherwise plan to flush every 8-12 weeks. Follow CA 125 at each visit. CA 125 pending today. Estimated risk of recurrence 10-20%.    Beckey Rutter, DNP, AGNP-C [place] at [facility] [phone]  (clinic)

## 2021-12-20 NOTE — Patient Instructions (Signed)
Please contact Dr. Danielle Rankin office for visit in 4 months.

## 2021-12-21 ENCOUNTER — Inpatient Hospital Stay: Payer: Medicare Other | Attending: Oncology | Admitting: Licensed Clinical Social Worker

## 2021-12-21 DIAGNOSIS — C561 Malignant neoplasm of right ovary: Secondary | ICD-10-CM

## 2021-12-21 LAB — CA 125: Cancer Antigen (CA) 125: 7.6 U/mL (ref 0.0–38.1)

## 2021-12-21 NOTE — Progress Notes (Signed)
[place] CSW Progress Note  Holiday representative met with patient to discuss adjustment concerns. Patient reports she is feeling, "out-of-it" since she increased her SNRI medication, per doctor's order.  CSW and patient discussed how she was feeling and the specific things that made her uncomfortable about the way she felt.  Patient stated, "feel numb, I'm thinking of anything" and how she would lose her thought process in the middle of a conversation. CSW explained to patient an increase in SNRIs can have this affect and typically it will take a little while for the feeling to dissipate.  Patient expressed concern this feeling would not go away and CSW and she discussed contacting her PCP to discuss these concerns with her PCP.  Patient stated she has an appointment with her PCP in three weeks and she will talk with her about it if she still feels uncomfortable.  CSW and discussed the benefits of not having ruminating thoughts and how to use mindfulness skills to be more comfortable with the way she felt now.  Patient ans CSW discussed staying physically active and creating lists with errands and things to do around the house, to focus on the moment.  Patient scheduled for June 8th at 11:00AM      Adelene Amas, LCSW

## 2021-12-26 ENCOUNTER — Telehealth: Payer: Self-pay | Admitting: Licensed Clinical Social Worker

## 2021-12-26 ENCOUNTER — Encounter: Payer: Self-pay | Admitting: Licensed Clinical Social Worker

## 2021-12-26 DIAGNOSIS — H5213 Myopia, bilateral: Secondary | ICD-10-CM | POA: Diagnosis not present

## 2021-12-26 DIAGNOSIS — H259 Unspecified age-related cataract: Secondary | ICD-10-CM | POA: Diagnosis not present

## 2021-12-26 DIAGNOSIS — Z135 Encounter for screening for eye and ear disorders: Secondary | ICD-10-CM | POA: Diagnosis not present

## 2021-12-26 DIAGNOSIS — H52223 Regular astigmatism, bilateral: Secondary | ICD-10-CM | POA: Diagnosis not present

## 2021-12-26 DIAGNOSIS — H524 Presbyopia: Secondary | ICD-10-CM | POA: Diagnosis not present

## 2021-12-27 ENCOUNTER — Other Ambulatory Visit: Payer: Self-pay | Admitting: Psychiatry

## 2021-12-27 ENCOUNTER — Inpatient Hospital Stay: Payer: Medicare Other | Admitting: Licensed Clinical Social Worker

## 2021-12-27 DIAGNOSIS — C561 Malignant neoplasm of right ovary: Secondary | ICD-10-CM

## 2021-12-27 NOTE — Progress Notes (Addendum)
[place] CSW Progress Note  Holiday representative met with patient to adjustment concerns post-treatment.  Patient reported she was feeling "much better today"  Patient sated she could not remember making the appointment with CSW last week, but was glad CSW called.  Patient stated she felt more connected to her emotions and her thought process was mush better and she didn't feel so detached but felt less responsive to triggers and less anxious.  Patient inquired about the doctor giving her an anti-depressant since she had anxiety, CSW explained often doctors will prescribe SSRIs or SNRIs for patient who has long-standing anxiety because these medications are very effective and are not addictive.  CSW encouraged patient to discuss any concern with PCP, during her next appointment.  Patient stated she has been thinking about "wasted time" and she is reflecting on her life.  CSW and patient discussed changing the thought from "wasted time" to time spent.  Patient stated she will try to use that phrase.  CSW and patient dicussed finishing her cleaning project now that she is feeling better and more aware.  Patent stated her goal is to finish it soon.  Patient scheduled an appointment for 01/18/2022 @ 11:00AM.     Adelene Amas, LCSW

## 2021-12-28 ENCOUNTER — Inpatient Hospital Stay: Payer: Medicare Other | Admitting: Licensed Clinical Social Worker

## 2022-01-04 NOTE — Progress Notes (Unsigned)
BH MD/PA/NP OP Progress Note  01/08/2022 3:04 PM Carrie Roberson  MRN:  [id number]  Chief Complaint:  Chief Complaint  Patient presents with   Follow-up   HPI:  This is a follow-up appointment for depression and anxiety.  She states that she has been doing better.  Although she initially struggle with difficulty in concentration (unable to finish sentence during the therapy, and she did not even care about this), it has been better.  She feels a lot calmer.  She was able to Christmas decorations put away.  She had her drawers sorted.  She is hoping to do some handy work after everything is organized.  She enjoys going to the patio.  She reports occasional conflict with her sister, it has been good generally.  She has an upcoming appointment for her cataract surgery.  She reports concern of UTI, which occurred 3 times.  She reports concern of being on certain antibiotics due to her C. Difficile.  She continues to have some loose stool.  She has an upcoming appointment as well with her urologist after this appointment.  She denies significant anxiety except the time she had this issues with UTI.  She took lorazepam 5 times since the last visit.  She has initial insomnia.  She usually does not want to take medication, although she may be open to try melatonin.  She denies change in appetite.  She denies SI.  She denies irritability.  She feels comfortable to stay on the current medication regimen.   In May with urologist, 160/84 Wt Readings from Last 3 Encounters:  01/08/22 165 lb 3.2 oz (74.9 kg)  12/20/21 163 lb 6.4 oz (74.1 kg)  12/14/21 165 lb (74.8 kg)     Household: sister, two dogs Marital status: single Number of children: Employment: retired in 2002, Barrister's clerk:   She was raised in [place]. She has three siblings. Her brother deceased from drowning accident at age 30. She was 76 year old that time. She states that she thinks about him every day, and has never passed it.  She was  taking care of her mother, who passed away at age 29. Although she misses her mother, she thinks it was better for her as her mother did not have good quality of life later in the course.  Visit Diagnosis:    ICD-10-CM   1. MDD (major depressive disorder), recurrent episode, mild ([place])  F33.0     2. Generalized anxiety disorder  F41.1     3. Initial insomnia  G47.09       Past Psychiatric History: Please see initial evaluation for full details. I have reviewed the history. No updates at this time.     Past Medical History:  Past Medical History:  Diagnosis Date   Anxiety    Arthritis    Diverticulitis    Family history of breast cancer    Family history of leukemia    GERD (gastroesophageal reflux disease)    History of ischemic colitis 09/02/2017   Hx of colonic polyp    Hypertension    H/O-PT TAKEN OFF BY PCP AS OF 2019   IBS (irritable bowel syndrome)    Ovarian cancer ([place])    Ovarian cyst    Personal history of chemotherapy 2021   6 treatments   Vaginal atrophy     Past Surgical History:  Procedure Laterality Date   ABDOMINAL HYSTERECTOMY     BREAST BIOPSY Left    needle bx/clip-neg  CERVICAL BIOPSY  W/ LOOP ELECTRODE EXCISION     CHOLECYSTECTOMY     Colonoscopoy  2007, 2008, 2011   COLONOSCOPY WITH PROPOFOL N/A 05/05/2018   Procedure: COLONOSCOPY WITH PROPOFOL;  Surgeon: Lin Landsman, MD;  Location: [facility] ENDOSCOPY;  Service: Gastroenterology;  Laterality: N/A;   FLEXIBLE SIGMOIDOSCOPY N/A 07/20/2021   Procedure: FLEXIBLE SIGMOIDOSCOPY;  Surgeon: Lesly Rubenstein, MD;  Location: [facility] ENDOSCOPY;  Service: Endoscopy;  Laterality: N/A;   HYSTERECTOMY ABDOMINAL WITH SALPINGO-OOPHORECTOMY N/A 08/19/2019   Procedure: HYSTERECTOMY ABDOMINAL WITH SALPINGO-OOPHORECTOMY WITH PELVIC WASHINGS PERITONEAL BIOPSIES AND PARA AORTIC LYMPH NODE DISSECTION;  Surgeon: [name], MD;  Location: [facility] ORS;  Service: Gynecology;  Laterality: N/A;   LEEP     OMENTECTOMY  N/A 08/19/2019   Procedure: OMENTECTOMY;  Surgeon: [name], MD;  Location: [facility] ORS;  Service: Gynecology;  Laterality: N/A;   OOPHORECTOMY     PORTA CATH INSERTION N/A 09/11/2019   Procedure: PORTA CATH INSERTION;  Surgeon: Algernon Huxley, MD;  Location: [place] CV LAB;  Service: Cardiovascular;  Laterality: N/A;   TUBAL LIGATION  1999    Family Psychiatric History: Please see initial evaluation for full details. I have reviewed the history. No updates at this time.     Family History:  Family History  Problem Relation Age of Onset   Heart failure Mother    Atrial fibrillation Mother    Hypertension Mother    Diabetes Father    Heart attack Father    Breast cancer Maternal Aunt 58   Breast cancer Maternal Grandmother 32   Breast cancer Cousin        2 mat cousins   Hypertension Sister    Diabetes Sister    Blindness Sister    Hypertension Sister    Arthritis Sister    Leukemia Paternal Aunt    Leukemia Paternal Uncle    Brain cancer Paternal Aunt    Other Brother 60       Drowning accident   Liver cancer Maternal Uncle    Alcohol abuse Maternal Uncle    Prostate cancer Neg Hx    Kidney cancer Neg Hx    BRCA 1/2 Neg Hx     Social History:  Social History   Socioeconomic History   Marital status: Single    Spouse name: Not on file   Number of children: 0   Years of education: Not on file   Highest education level: Associate degree: academic program  Occupational History   Occupation: Retired  Tobacco Use   Smoking status: Never   Smokeless tobacco: Never   Tobacco comments:    2bd hand smoke from father  Vaping Use   Vaping Use: Never used  Substance and Sexual Activity   Alcohol use: Not Currently    Comment: rarely   Drug use: No   Sexual activity: Not Currently    Birth control/protection: Post-menopausal  Other Topics Concern   Not on file  Social History Narrative   Not on file   Social Determinants of Health   Financial Resource  Strain: Low Risk  (09/13/2021)   Overall Financial Resource Strain (CARDIA)    Difficulty of Paying Living Expenses: Not hard at all  Food Insecurity: No Food Insecurity (09/13/2021)   Hunger Vital Sign    Worried About Running Out of Food in the Last Year: Never true    Ran Out of Food in the Last Year: Never true  Transportation Needs: No Transportation Needs (09/13/2021)  PRAPARE - Hydrologist (Medical): No    Lack of Transportation (Non-Medical): No  Physical Activity: Inactive (09/13/2021)   Exercise Vital Sign    Days of Exercise per Week: 0 days    Minutes of Exercise per Session: 0 min  Stress: Stress Concern Present (09/13/2021)   [place]    Feeling of Stress : Very much  Social Connections: Socially Isolated (09/13/2021)   Social Connection and Isolation Panel [NHANES]    Frequency of Communication with Friends and Family: Once a week    Frequency of Social Gatherings with Friends and Family: Never    Attends Religious Services: Never    Marine scientist or Organizations: Yes    Attends Archivist Meetings: Never    Marital Status: Never married    Allergies:  Allergies  Allergen Reactions   Citalopram Nausea And Vomiting   Ivp Dye [Iodinated Contrast Media] Hives and Swelling    hives   Meloxicam     unknown   Adhesive [Tape] Rash   Amlodipine Rash   Ampicillin Rash    Did it involve swelling of the face/tongue/throat, SOB, or low BP? No Did it involve sudden or severe rash/hives, skin peeling, or any reaction on the inside of your mouth or nose? Yes Did you need to seek medical attention at a hospital or doctor's office? Yes When did it last happen?       10 + years If all above answers are "NO", may proceed with cephalosporin use.   Cefoxitin Rash   Chlorhexidine Rash    Chloraprep Scrub Teal   Estradiol Rash   Estropipate Rash   Olmesartan  Medoxomil-Hctz Rash   Sertraline Rash        Sulfa Antibiotics Rash    Metabolic Disorder Labs: No results found for: "HGBA1C", "MPG" No results found for: "PROLACTIN" Lab Results  Component Value Date   CHOL 188 09/02/2017   TRIG 131 09/02/2017   HDL 53 09/02/2017   CHOLHDL 3.5 09/02/2017   VLDL 26 09/02/2017   LDLCALC 109 (H) 09/02/2017   Lab Results  Component Value Date   TSH 1.535 07/19/2021   TSH 0.856 09/02/2017    Therapeutic Level Labs: No results found for: "LITHIUM" No results found for: "VALPROATE" No results found for: "CBMZ"  Current Medications: Current Outpatient Medications  Medication Sig Dispense Refill   acetaminophen (TYLENOL) 500 MG tablet Take 1,000 mg by mouth every 6 (six) hours as needed (for pain.).     Cranberry 300 MG tablet Take 300 mg by mouth 2 (two) times daily.     fluticasone (FLONASE) 50 MCG/ACT nasal spray Place 1 spray into both nostrils daily. otc     lisinopril (ZESTRIL) 5 MG tablet Take 1 tablet (5 mg total) by mouth daily. 90 tablet 1   loratadine (CLARITIN) 10 MG tablet Take 10 mg by mouth daily. otc     LORazepam (ATIVAN) 0.5 MG tablet Take 1 tablet (0.5 mg total) by mouth daily as needed for anxiety (for severe anxiety). 30 tablet 0   mirabegron ER (MYRBETRIQ) 50 MG TB24 tablet Take 1 tablet (50 mg total) by mouth daily. 30 tablet 11   omeprazole (PRILOSEC) 10 MG capsule Take 10 mg by mouth daily.     Polyethylene Glycol 3350 (MIRALAX PO) Take by mouth.     pregabalin (LYRICA) 75 MG capsule Take 1 capsule (75 mg total) by mouth 2 (  two) times daily. 60 capsule 3   venlafaxine XR (EFFEXOR-XR) 75 MG 24 hr capsule Take 1 capsule (75 mg total) by mouth daily with breakfast. 30 capsule 1   No current facility-administered medications for this visit.   Facility-Administered Medications Ordered in Other Visits  Medication Dose Route Frequency Provider Last Rate Last Admin   sodium chloride flush (NS) 0.9 % injection 10 mL  10 mL  Intravenous PRN Sindy Guadeloupe, MD   10 mL at 01/05/20 0750     Musculoskeletal: Strength & Muscle Tone: within normal limits Gait & Station: normal Patient leans: N/A  Psychiatric Specialty Exam: Review of Systems  Psychiatric/Behavioral:  Positive for decreased concentration and sleep disturbance. Negative for agitation, behavioral problems, confusion, dysphoric mood, hallucinations, self-injury and suicidal ideas. The patient is nervous/anxious. The patient is not hyperactive.   All other systems reviewed and are negative.   Blood pressure (!) 166/90, pulse 87, temperature 97.9 F (36.6 C), temperature source Temporal, weight 165 lb 3.2 oz (74.9 kg).Body mass index is 30.22 kg/m.  General Appearance: Fairly Groomed  Eye Contact:  Good  Speech:  Clear and Coherent  Volume:  Normal  Mood:   better  Affect:  Appropriate, Congruent, and Full Range  Thought Process:  Coherent  Orientation:  Full (Time, Place, and Person)  Thought Content: Logical   Suicidal Thoughts:  No  Homicidal Thoughts:  No  Memory:  Immediate;   Good  Judgement:  Good  Insight:  Good  Psychomotor Activity:  Normal  Concentration:  Concentration: Good and Attention Span: Good  Recall:  Good  Fund of Knowledge: Good  Language: Good  Akathisia:  No  Handed:  Right  AIMS (if indicated): not done  Assets:  Communication Skills Desire for Improvement  ADL's:  Intact  Cognition: WNL  Sleep:  Poor   Screenings: GAD-7    Flowsheet Row Office Visit from 10/27/2021 in [facility] Office Visit from 03/30/2021 in [facility] Office Visit from 03/29/2020 in [facility] Office Visit from 02/23/2020 in [facility] Office Visit from 03/23/2019 in [facility]  Total GAD-7 Score 0 2 2 1 4       PHQ2-9    [place] Office Visit from 01/08/2022 in [place] Office Visit from 12/05/2021 in [place] Office  Visit from 10/27/2021 in [place] [facility] from 09/13/2021 in [place] [facility] [place] Visit from 07/28/2021 in [facility]  PHQ-2 Total Score 0 4 0 6 0  PHQ-9 Total Score 3 11 0 15 [place] Visit from 01/08/2022 in [place] [facility] from 12/07/2021 in [facility] [place] from 12/05/2021 in [place] No Risk No Risk No Risk        Assessment and Plan:  Carrie Roberson is a 76 y.o. 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   1. Generalized anxiety disorder 2. MDD (major depressive disorder), recurrent episode, mild (HCC) There has been significant improvement in depressive symptoms and anxiety since starting venlafaxine.  Psychosocial stressors includes history of ovarian cancer, loss of her mother, and grief of loss of her brother, who passed away at age 49.  Will continue venlafaxine  at the current dose to target depression and anxiety.   Will continue lorazepam as needed for anxiety.  She will greatly benefit from CBT; she will continue to see her therapist.   # Hypertension She does have hypertension, although she attributes this to not taking her antihypertensive medication this morning.  She agrees to discuss with her PCP for father evaluation.  Discussed again regarding the potential risk of hypertension from venlafaxine.   # Insomnia She reports initial insomnia.  Discussed sleep hygiene.  She was advised to try melatonin for insomnia.     Plan Continue venlafaxine 75 mg daily- monitor hypertension Continue lorazepam 0.5 mg daily as needed for anxiety (she rarely takes this. She declined refill this time) Try melatonin 3 mg at night, 2 hours before going to bed Next  appointment: 8/14 at 10 AM for 30 mins, in person   The patient demonstrates the following risk factors for suicide: Chronic risk factors for suicide include: psychiatric disorder of depression, anxiety . Acute risk factors for suicide include: unemployment and loss (financial, interpersonal, professional). Protective factors for this patient include: positive social support, coping skills, and hope for the future. Considering these factors, the overall suicide risk at this point appears to be low. Patient is appropriate for outpatient follow up.     This clinician has discussed the side effect associated with medication prescribed during this encounter. Please refer to notes in the previous encounters for more details.    Collaboration of Care: Collaboration of Care: Other N/A  Patient/Guardian was advised Release of Information must be obtained prior to any record release in order to collaborate their care with an outside provider. Patient/Guardian was advised if they have not already done so to contact the registration department to sign all necessary forms in order for [place] to release information regarding their care.   Consent: Patient/Guardian gives verbal consent for treatment and assignment of benefits for services provided during this visit. Patient/Guardian expressed understanding and agreed to proceed.    Norman Clay, MD 01/08/2022, 3:04 PM

## 2022-01-08 ENCOUNTER — Ambulatory Visit (INDEPENDENT_AMBULATORY_CARE_PROVIDER_SITE_OTHER): Payer: Medicare Other | Admitting: Physician Assistant

## 2022-01-08 ENCOUNTER — Ambulatory Visit (INDEPENDENT_AMBULATORY_CARE_PROVIDER_SITE_OTHER): Payer: Medicare Other | Admitting: Psychiatry

## 2022-01-08 ENCOUNTER — Encounter: Payer: Self-pay | Admitting: Physician Assistant

## 2022-01-08 ENCOUNTER — Encounter: Payer: Self-pay | Admitting: Psychiatry

## 2022-01-08 VITALS — BP 166/90 | HR 87 | Temp 97.9°F | Wt 165.2 lb

## 2022-01-08 VITALS — BP 155/78 | HR 81 | Temp 98.2°F | Ht 62.0 in | Wt 163.0 lb

## 2022-01-08 DIAGNOSIS — F330 Major depressive disorder, recurrent, mild: Secondary | ICD-10-CM | POA: Diagnosis not present

## 2022-01-08 DIAGNOSIS — G4709 Other insomnia: Secondary | ICD-10-CM

## 2022-01-08 DIAGNOSIS — F411 Generalized anxiety disorder: Secondary | ICD-10-CM | POA: Diagnosis not present

## 2022-01-08 DIAGNOSIS — N390 Urinary tract infection, site not specified: Secondary | ICD-10-CM | POA: Diagnosis not present

## 2022-01-08 MED ORDER — VENLAFAXINE HCL ER 75 MG PO CP24: 75.0000 mg | ORAL_CAPSULE | Freq: Every day | ORAL | 1 refills | Status: DC

## 2022-01-08 MED ORDER — NITROFURANTOIN MONOHYD MACRO 100 MG PO CAPS: 100.0000 mg | ORAL_CAPSULE | Freq: Two times a day (BID) | ORAL | 0 refills | Status: AC

## 2022-01-08 NOTE — Progress Notes (Unsigned)
01/08/2022 3:28 PM   [name] 1946-04-12 [id number]  CC: Chief Complaint  Patient presents with   Urinary Tract Infection   HPI: Carrie Roberson is a 76 y.o. 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   Today she reports 5 days of dysuria, urgency, and frequency.  She denies fever, chills, nausea, or vomiting.  She has been taking Azo for symptom relief.  She is frustrated with her frequency of infection.  She is taking cranberry supplements daily for UTI prevention.  She does not desire to pursue vaginal estrogen cream with her history of ovarian cancer.  She is also concerned about antibiotic use with her history of C. difficile.  In-office UA today positive for 1+ glucose, 1+ ketones, 2+ protein, 4.0 EU/DL urobilinogen, nitrites, and 3+ leukocyte esterase; urine microscopy with >30 WBCs/HPF and moderate bacteria.   PMH: Past Medical History:  Diagnosis Date   Anxiety    Arthritis    Diverticulitis    Family history of breast cancer    Family history of leukemia    GERD (gastroesophageal reflux disease)    History of ischemic colitis 09/02/2017   Hx of colonic polyp    Hypertension    H/O-PT TAKEN OFF BY PCP AS OF 2019   IBS (irritable bowel syndrome)    Ovarian cancer ([place])    Ovarian cyst    Personal history of chemotherapy 2021   6 treatments   Vaginal atrophy     Surgical History: Past Surgical History:  Procedure Laterality Date   ABDOMINAL HYSTERECTOMY     BREAST BIOPSY Left    needle bx/clip-neg   CERVICAL BIOPSY  W/ LOOP ELECTRODE EXCISION     CHOLECYSTECTOMY     Colonoscopoy  2007, 2008, 2011   COLONOSCOPY WITH PROPOFOL N/A 05/05/2018   Procedure: COLONOSCOPY WITH PROPOFOL;  Surgeon: Lin Landsman, MD;  Location: [facility] ENDOSCOPY;   Service: Gastroenterology;  Laterality: N/A;   FLEXIBLE SIGMOIDOSCOPY N/A 07/20/2021   Procedure: FLEXIBLE SIGMOIDOSCOPY;  Surgeon: Lesly Rubenstein, MD;  Location: [facility] ENDOSCOPY;  Service: Endoscopy;  Laterality: N/A;   HYSTERECTOMY ABDOMINAL WITH SALPINGO-OOPHORECTOMY N/A 08/19/2019   Procedure: HYSTERECTOMY ABDOMINAL WITH SALPINGO-OOPHORECTOMY WITH PELVIC WASHINGS PERITONEAL BIOPSIES AND PARA AORTIC LYMPH NODE DISSECTION;  Surgeon: [name], MD;  Location: [facility] ORS;  Service: Gynecology;  Laterality: N/A;   LEEP     OMENTECTOMY N/A 08/19/2019   Procedure: OMENTECTOMY;  Surgeon: [name], MD;  Location: [facility] ORS;  Service: Gynecology;  Laterality: N/A;   OOPHORECTOMY     PORTA CATH INSERTION N/A 09/11/2019   Procedure: PORTA CATH INSERTION;  Surgeon: Algernon Huxley, MD;  Location: [place] CV LAB;  Service: Cardiovascular;  Laterality: N/A;   TUBAL LIGATION  1999    Home Medications:  Allergies as of 01/08/2022       Reactions   Citalopram Nausea And Vomiting   Ivp Dye [iodinated Contrast Media] Hives, Swelling   hives   Meloxicam    unknown   Adhesive [tape] Rash   Amlodipine Rash   Ampicillin Rash   Did it involve swelling of the face/tongue/throat, SOB, or low BP? No Did it involve sudden or severe rash/hives, skin peeling, or any reaction on the inside of your mouth or nose? Yes Did you need to seek medical attention at  a hospital or doctor's office? Yes When did it last happen?       10 + years If all above answers are "NO", may proceed with cephalosporin use.   Cefoxitin Rash   Chlorhexidine Rash   Chloraprep Scrub Teal   Estradiol Rash   Estropipate Rash   Olmesartan Medoxomil-hctz Rash   Sertraline Rash      Sulfa Antibiotics Rash        Medication List        Accurate as of January 08, 2022  3:28 PM. If you have any questions, ask your nurse or doctor.          acetaminophen 500 MG tablet Commonly known as: TYLENOL Take 1,000 mg by  mouth every 6 (six) hours as needed (for pain.).   Cranberry 300 MG tablet Take 300 mg by mouth 2 (two) times daily.   fluticasone 50 MCG/ACT nasal spray Commonly known as: FLONASE Place 1 spray into both nostrils daily. otc   lisinopril 5 MG tablet Commonly known as: ZESTRIL Take 1 tablet (5 mg total) by mouth daily.   loratadine 10 MG tablet Commonly known as: CLARITIN Take 10 mg by mouth daily. otc   LORazepam 0.5 MG tablet Commonly known as: ATIVAN Take 1 tablet (0.5 mg total) by mouth daily as needed for anxiety (for severe anxiety).   mirabegron ER 50 MG Tb24 tablet Commonly known as: Myrbetriq Take 1 tablet (50 mg total) by mouth daily.   MIRALAX PO Take by mouth.   omeprazole 10 MG capsule Commonly known as: PRILOSEC Take 10 mg by mouth daily.   pregabalin 75 MG capsule Commonly known as: Lyrica Take 1 capsule (75 mg total) by mouth 2 (two) times daily.   venlafaxine XR 75 MG 24 hr capsule Commonly known as: EFFEXOR-XR Take 1 capsule (75 mg total) by mouth daily with breakfast. What changed:  additional instructions Another medication with the same name was removed. Continue taking this medication, and follow the directions you see here. Changed by: Norman Clay, MD        Allergies:  Allergies  Allergen Reactions   Citalopram Nausea And Vomiting   Ivp Dye [Iodinated Contrast Media] Hives and Swelling    hives   Meloxicam     unknown   Adhesive [Tape] Rash   Amlodipine Rash   Ampicillin Rash    Did it involve swelling of the face/tongue/throat, SOB, or low BP? No Did it involve sudden or severe rash/hives, skin peeling, or any reaction on the inside of your mouth or nose? Yes Did you need to seek medical attention at a hospital or doctor's office? Yes When did it last happen?       10 + years If all above answers are "NO", may proceed with cephalosporin use.   Cefoxitin Rash   Chlorhexidine Rash    Chloraprep Scrub Teal   Estradiol Rash    Estropipate Rash   Olmesartan Medoxomil-Hctz Rash   Sertraline Rash        Sulfa Antibiotics Rash    Family History: Family History  Problem Relation Age of Onset   Heart failure Mother    Atrial fibrillation Mother    Hypertension Mother    Diabetes Father    Heart attack Father    Breast cancer Maternal Aunt 8   Breast cancer Maternal Grandmother 42   Breast cancer Cousin        2 mat cousins   Hypertension Sister    Diabetes Sister  Blindness Sister    Hypertension Sister    Arthritis Sister    Leukemia Paternal Aunt    Leukemia Paternal Uncle    Brain cancer Paternal Aunt    Other Brother 72       Drowning accident   Liver cancer Maternal Uncle    Alcohol abuse Maternal Uncle    Prostate cancer Neg Hx    Kidney cancer Neg Hx    BRCA 1/2 Neg Hx     Social History:   reports that she has never smoked. She has never used smokeless tobacco. She reports that she does not currently use alcohol. She reports that she does not use drugs.  Physical Exam: BP (!) 155/78 (BP Location: Left Arm, Patient Position: Sitting, Cuff Size: Large) Comment: forgot BP meds  Pulse 81   Temp 98.2 F (36.8 C) (Oral)   Ht 5' 2"  (1.575 m)   Wt 163 lb (73.9 kg)   BMI 29.81 kg/m   Constitutional:  Alert and oriented, no acute distress, nontoxic appearing HEENT: [place], AT Cardiovascular: No clubbing, cyanosis, or edema Respiratory: Normal respiratory effort, no increased work of breathing Skin: No rashes, bruises or suspicious lesions Neurologic: Grossly intact, no focal deficits, moving all 4 extremities Psychiatric: Normal mood and affect  Laboratory Data: Results for orders placed or performed in visit on 01/08/22  Microscopic Examination   Urine  Result Value Ref Range   WBC, UA >30 (A) 0 - 5 /hpf   RBC 0-2 0 - 2 /hpf   Epithelial Cells (non renal) 0-10 0 - 10 /hpf   Bacteria, UA Moderate (A) None seen/Few  Urinalysis, Complete  Result Value Ref Range   Specific Gravity,  UA <1.005 (L) 1.005 - 1.030   pH, UA 5.0 5.0 - 7.5   Color, UA Orange Yellow   Appearance Ur Clear Clear   Leukocytes,UA 3+ (A) Negative   Protein,UA 2+ (A) Negative/Trace   Glucose, UA 1+ (A) Negative   Ketones, UA 1+ (A) Negative   RBC, UA Negative Negative   Bilirubin, UA Negative Negative   Urobilinogen, Ur 4.0 (H) 0.2 - 1.0 mg/dL   Nitrite, UA Positive (A) Negative   Microscopic Examination See below:    Assessment & Plan:   1. Recurrent UTI UA grossly infected today, will start empiric Macrobid and send for culture for further evaluation.  We discussed augmenting her prevention regimen with daily lactobacillus containing probiotics and d-mannose supplements.  Counseled her to continue cranberry.  Will defer vaginal estrogen cream due to her history of ovarian cancer.  We discussed starting daily suppressive antibiotics and the risks of increasing antibiotic resistance with this.  She would like to follow-up with me after completion of culture appropriate antibiotics to discuss this further, which is appropriate.  We will see her with next week. - Urinalysis, Complete - CULTURE, URINE COMPREHENSIVE - nitrofurantoin, macrocrystal-monohydrate, (MACROBID) 100 MG capsule; Take 1 capsule (100 mg total) by mouth 2 (two) times daily for 7 days.  Dispense: 14 capsule; Refill: 0   Return in about 1 week (around 01/15/2022) for rUTI f/u with UA.  Debroah Loop, PA-C  [facility] [address] [place], [place] [address] [phone]

## 2022-01-08 NOTE — Patient Instructions (Addendum)
Continue venlafaxine 75 mg daily Continue lorazepam 0.5 mg daily as needed for anxiety Try melatonin 3 mg at night, 2 hours before going to bed Next appointment: 8/14 at 10 AM, in person

## 2022-01-08 NOTE — Patient Instructions (Signed)
Recurrent UTI Prevention Strategies  Continue taking an over-the-counter cranberry supplement for urinary tract health. Take this once or twice daily on an empty stomach, e.g. right before bed. Start taking an over-the-counter d-mannose supplement. Take this daily per packaging instructions. Start taking an over-the-counter probiotic, preferably containing lactobacillus. Take this daily.

## 2022-01-09 LAB — URINALYSIS, COMPLETE
Bilirubin, UA: NEGATIVE
Nitrite, UA: POSITIVE — AB
RBC, UA: NEGATIVE
Specific Gravity, UA: <1.005 — ABNORMAL LOW (ref 1.005–1.030)
Urobilinogen, Ur: 4 mg/dL — ABNORMAL HIGH (ref 0.2–1.0)
pH, UA: 5 (ref 5.0–7.5)

## 2022-01-09 LAB — MICROSCOPIC EXAMINATION: WBC, UA: >30 /hpf — AB (ref 0–5)

## 2022-01-11 LAB — CULTURE, URINE COMPREHENSIVE

## 2022-01-12 ENCOUNTER — Other Ambulatory Visit: Payer: Self-pay | Admitting: Family Medicine

## 2022-01-12 DIAGNOSIS — I10 Essential (primary) hypertension: Secondary | ICD-10-CM

## 2022-01-15 ENCOUNTER — Ambulatory Visit: Payer: Medicare Other | Admitting: Physician Assistant

## 2022-01-15 DIAGNOSIS — H25812 Combined forms of age-related cataract, left eye: Secondary | ICD-10-CM | POA: Diagnosis not present

## 2022-01-15 DIAGNOSIS — H25811 Combined forms of age-related cataract, right eye: Secondary | ICD-10-CM | POA: Diagnosis not present

## 2022-01-16 ENCOUNTER — Encounter: Payer: Self-pay | Admitting: Physician Assistant

## 2022-01-16 ENCOUNTER — Ambulatory Visit (INDEPENDENT_AMBULATORY_CARE_PROVIDER_SITE_OTHER): Payer: Medicare Other | Admitting: Physician Assistant

## 2022-01-16 VITALS — BP 123/74 | HR 77 | Ht 62.0 in | Wt 165.0 lb

## 2022-01-16 DIAGNOSIS — N390 Urinary tract infection, site not specified: Secondary | ICD-10-CM

## 2022-01-16 LAB — URINALYSIS, COMPLETE
Bilirubin, UA: NEGATIVE
Glucose, UA: NEGATIVE
Ketones, UA: NEGATIVE
Nitrite, UA: NEGATIVE
Protein,UA: NEGATIVE
Specific Gravity, UA: 1.015 (ref 1.005–1.030)
Urobilinogen, Ur: 0.2 mg/dL (ref 0.2–1.0)
pH, UA: 7.5 (ref 5.0–7.5)

## 2022-01-16 LAB — MICROSCOPIC EXAMINATION: Bacteria, UA: NONE SEEN

## 2022-01-16 MED ORDER — NITROFURANTOIN MONOHYD MACRO 100 MG PO CAPS: 100.0000 mg | ORAL_CAPSULE | Freq: Every day | ORAL | 2 refills | Status: DC

## 2022-01-18 ENCOUNTER — Inpatient Hospital Stay: Payer: Medicare Other | Admitting: Licensed Clinical Social Worker

## 2022-01-24 ENCOUNTER — Other Ambulatory Visit: Payer: Self-pay | Admitting: Psychiatry

## 2022-01-24 DIAGNOSIS — C561 Malignant neoplasm of right ovary: Secondary | ICD-10-CM

## 2022-01-24 NOTE — Telephone Encounter (Signed)
Lorazepam refill ordered.

## 2022-01-25 ENCOUNTER — Inpatient Hospital Stay: Payer: Medicare Other | Admitting: Licensed Clinical Social Worker

## 2022-01-25 IMAGING — CT CT ABDOMEN W/O CM
2 of 4 series · 13 of 46 positions shown, 15 images · non-contrast
Comparison: None.

CLINICAL DATA: Ovarian cancer restaging. Lower abdominal pain.

EXAM:
CT CHEST AND ABDOMEN WITHOUT CONTRAST
TECHNIQUE: Multidetector CT imaging of the chest and abdomen was performed
following the standard protocol without IV contrast.

[Series 2: axials cap 5.00 · axial · 0.81mm/px · z∈[-1322,-952]mm · 10 of 92 slices shown, 12 images]
[im 9/92  soft-tissue]
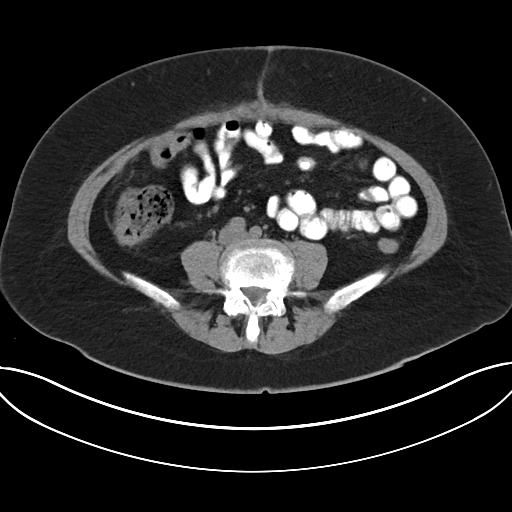
[im 9/92  bone]
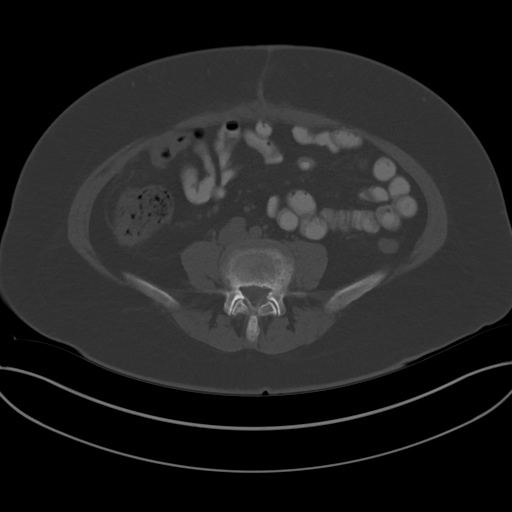
[im 17/92  soft-tissue]
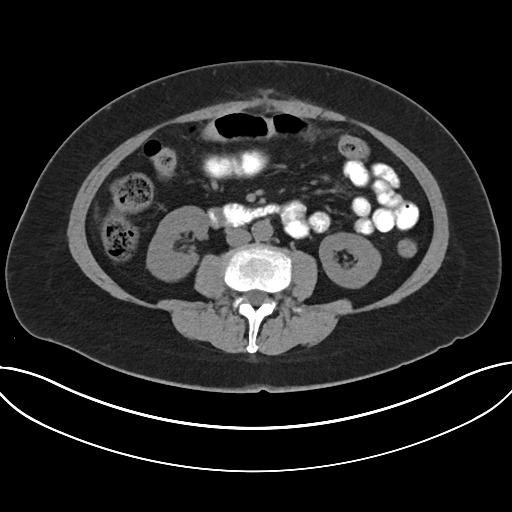
[im 25/92  soft-tissue]
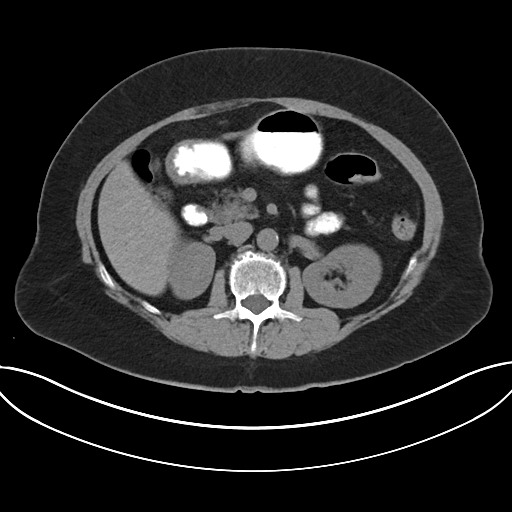
[im 34/92  soft-tissue]
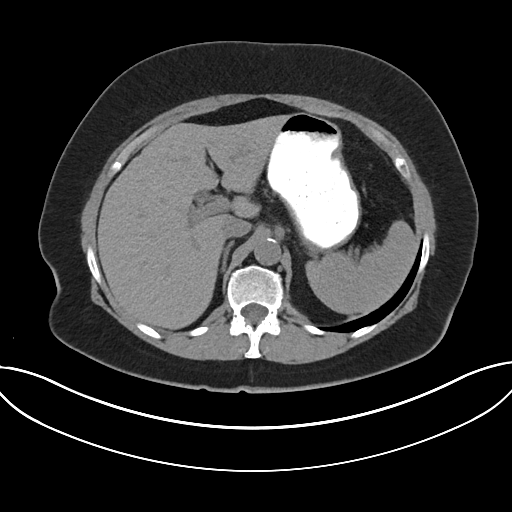
[im 42/92  soft-tissue]
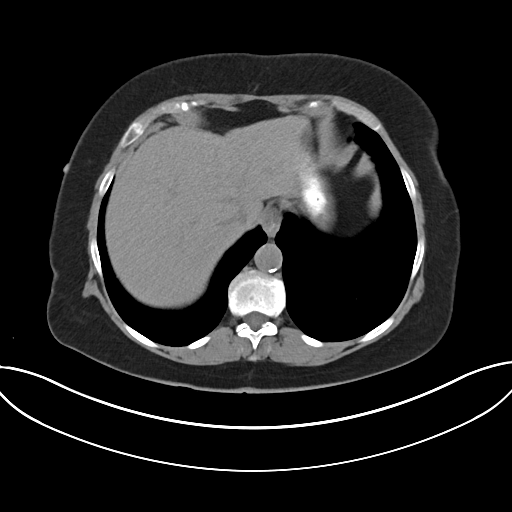
[im 50/92  soft-tissue]
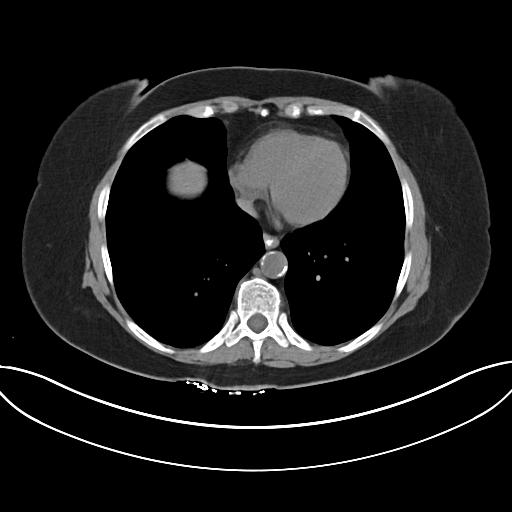
[im 58/92  soft-tissue]
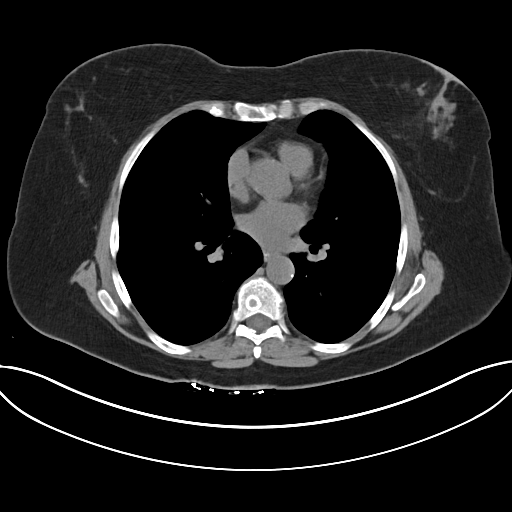
[im 67/92  soft-tissue]
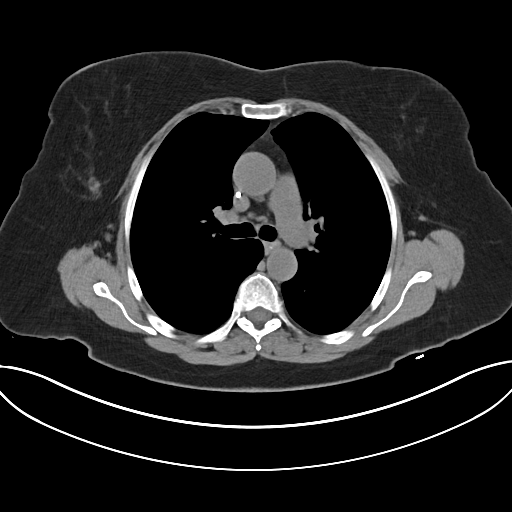
[im 75/92  soft-tissue]
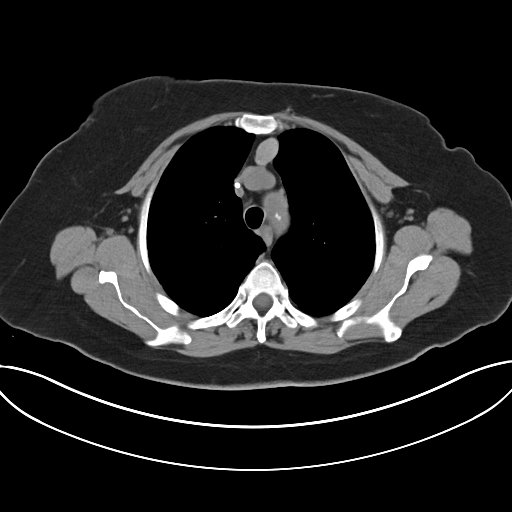
[im 75/92  bone]
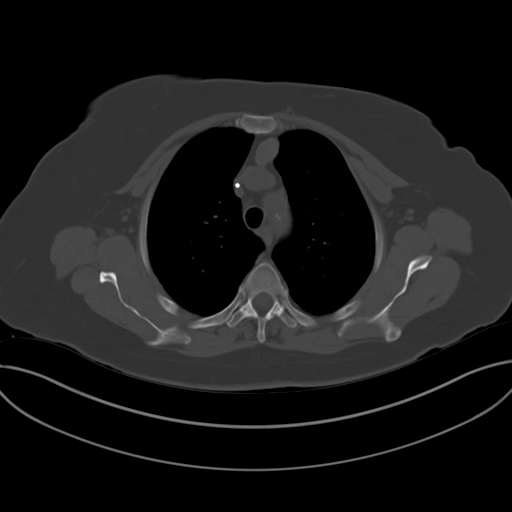
[im 83/92  soft-tissue]
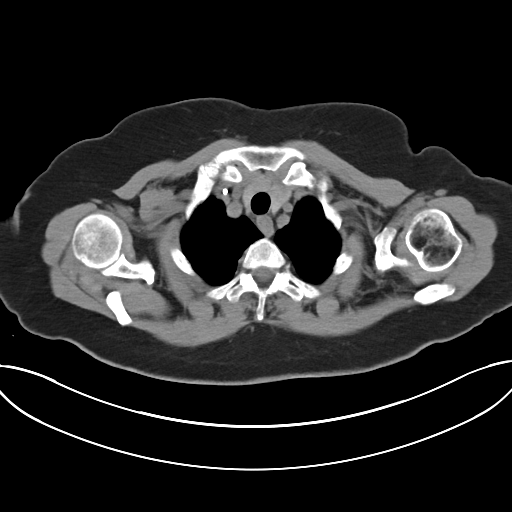

[Series 4: coronals cap 2.00 cor · coronal · 0.81mm/px · 3 of 145 slices shown]
[im 49/145  soft-tissue]
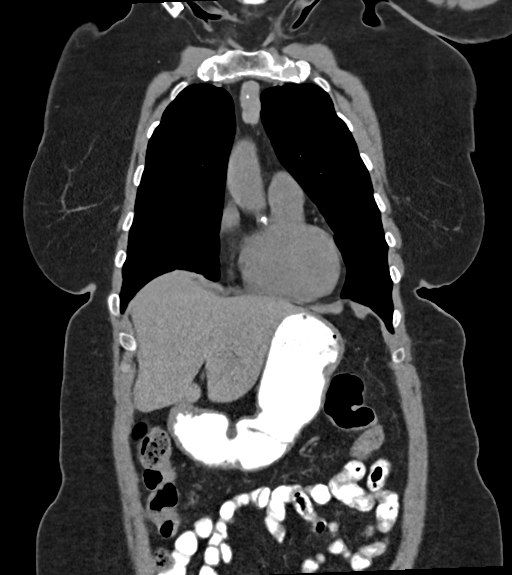
[im 65/145  soft-tissue]
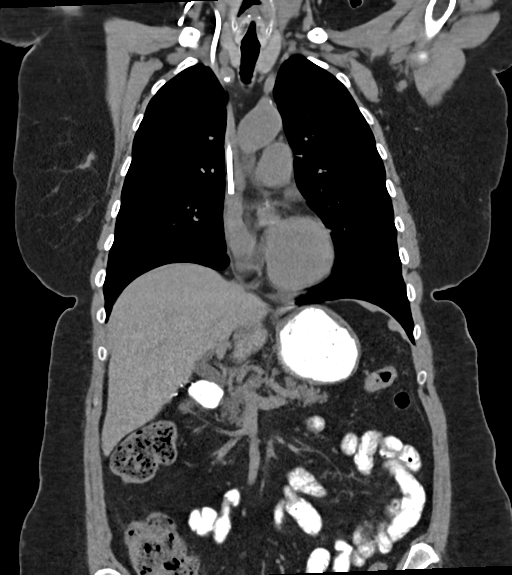
[im 81/145  soft-tissue]
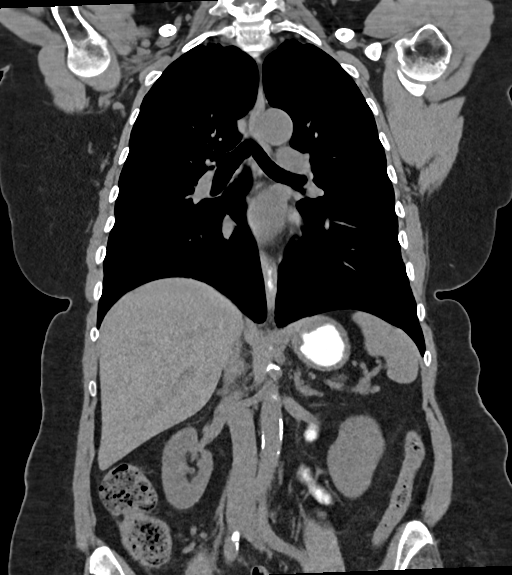

[13 of 46 positions shown; findings below may reference images not displayed]

FINDINGS: CT CHEST FINDINGS

Cardiovascular: Port-A-Cath tip: Lower SVC. Atherosclerotic
calcification of the thoracic aorta and branch vessels.

Mediastinum/Nodes: Contrast medium in the distal esophagus
suggesting reflux or dysmotility.

Oval-shaped structure posterior to the upper thoracic esophagus,
probably a lymph node, 0.7 cm in short axis on image [DATE].

Nodularity along the inferior margin of the thyroid isthmus,
contiguous with the thyroid gland, measuring 2.1 by 1.9 by 1.9 cm.

Anterior mediastinal nodule with roughly similar density to the
thyroid gland but separate from the thyroid, measuring 2.5 by 1.5 by
3.4 cm, with punctate calcifications posteriorly. Right hilar node
1.0 cm in short axis on image 73/4.

Lungs/Pleura: Biapical pleuroparenchymal scarring. 0.4 by 0.2 cm
nodule in the left lower lobe on image 88/3 mild airway plugging in
the left upper lobe on image 43/3. 2 mm subpleural nodule along the
left major fissure inferiorly on image 124/3. Linear subsegmental
atelectasis or scarring peripherally in the right upper lobe on
image 51/3.

Musculoskeletal: Incidental Schmorl's nodes in the midthoracic
spine.

CT ABDOMEN PELVIS FINDINGS

Hepatobiliary: Cholecystectomy. Otherwise unremarkable.

Pancreas: Unremarkable

Spleen: Unremarkable

Adrenals/Urinary Tract: Unremarkable

Stomach/Bowel: Unremarkable

Vascular/Lymphatic: Aortoiliac atherosclerotic vascular disease. No
pathologic adenopathy in the upper abdomen.

Other: Prior laparotomy with some faint stranding along the anterior
omentum in the immediate vicinity of the laparotomy site.

Musculoskeletal: Multilevel lumbar degenerative disc disease.
IMPRESSION: 1. Suspected thyroid nodule along the inferior isthmus margin of the
thyroid gland along with a separate anterior mediastinal fairly high
density mass with punctate calcifications. The high density
appearance tends to favor mediastinal ectopic thyroid tissue,
although is not entirely specific on today's noncontrast
examination. Thyroid scintigraphy should be considered to further
characterize these lesions; if negative on scintigraphy then further
workup to exclude the possibility of lymphoma or thymic tumor would
be suggested.
2. Contrast medium in the distal esophagus suggesting reflux or
dysmotility.
3. Several tiny pulmonary nodules are present, 3 mm in average
diameter or less. No follow-up needed if patient is low-risk (and
has no known or suspected primary neoplasm). Non-contrast chest CT
can be considered in 12 months if patient is high-risk. This
recommendation follows the consensus statement: Guidelines for
Management of Incidental Pulmonary Nodules Detected on CT Images:
4. Aortoiliac atherosclerotic vascular disease.
5. Multilevel lumbar degenerative disc disease.
6. Prior laparotomy with some faint stranding along the anterior
omentum in the immediate vicinity of the laparotomy site, likely
from benign scarring.

Aortic Atherosclerosis ([id number]-[id number]).

## 2022-01-26 ENCOUNTER — Ambulatory Visit: Admit: 2022-01-26 | Payer: Medicare Other

## 2022-01-26 SURGERY — COLONOSCOPY WITH PROPOFOL
Anesthesia: General

## 2022-01-30 ENCOUNTER — Other Ambulatory Visit: Payer: Self-pay | Admitting: Psychiatry

## 2022-02-08 ENCOUNTER — Inpatient Hospital Stay: Payer: Medicare Other | Admitting: Licensed Clinical Social Worker

## 2022-02-12 ENCOUNTER — Other Ambulatory Visit: Payer: Self-pay

## 2022-02-14 ENCOUNTER — Inpatient Hospital Stay: Payer: Medicare Other | Attending: Oncology

## 2022-02-14 DIAGNOSIS — C561 Malignant neoplasm of right ovary: Secondary | ICD-10-CM | POA: Diagnosis not present

## 2022-02-14 DIAGNOSIS — Z452 Encounter for adjustment and management of vascular access device: Secondary | ICD-10-CM | POA: Insufficient documentation

## 2022-02-14 DIAGNOSIS — Z95828 Presence of other vascular implants and grafts: Secondary | ICD-10-CM

## 2022-02-14 MED ORDER — SODIUM CHLORIDE 0.9% FLUSH
10.0000 mL | INTRAVENOUS | Status: DC | PRN
  Administered 2022-02-14: 10 mL via INTRAVENOUS
  Filled 2022-02-14: qty 10

## 2022-02-14 MED ORDER — HEPARIN SOD (PORK) LOCK FLUSH 100 UNIT/ML IV SOLN
500.0000 [IU] | Freq: Once | INTRAVENOUS | Status: AC
  Administered 2022-02-14: 500 [IU] via INTRAVENOUS
  Filled 2022-02-14: qty 5

## 2022-02-15 ENCOUNTER — Inpatient Hospital Stay: Payer: Medicare Other | Admitting: Licensed Clinical Social Worker

## 2022-02-15 DIAGNOSIS — C561 Malignant neoplasm of right ovary: Secondary | ICD-10-CM

## 2022-02-15 NOTE — Progress Notes (Signed)
[place] CSW Counseling Note  Patient was referred by medical provider. Treatment type: Individual  Presenting Concerns: Patient and/or family reports the following symptoms/concerns: anxiety and adjustment concerns post treatment Duration of problem: 6 months; Severity of problem: moderate   Orientation:oriented to person, place, time/date, situation, day of week, month of year, and year.   Affect: Appropriate Risk of harm to self or others: No plan to harm self or others  Patient and/or Family's Strengths/Protective Factors: Social connections, Social and Emotional competence, Concrete supports in place (healthy food, safe environments, etc.), and Physical Health (exercise, healthy diet, medication compliance, etc.)Ability for insight  Active sense of humor  Average or above average intelligence  Capable of independent living  Engineer, drilling fund of knowledge  Motivation for treatment/growth  Physical Health  Special hobby/interest  Supportive family/friends  Work skills      Goals Addressed: Patient will:  Reduce symptoms of: anxiety Increase knowledge and/or ability of: coping skills  Increase healthy adjustment to current life circumstances and increase acceptance of unknown circumstances.   Progress towards Goals: Initial   Interventions: Interventions utilized:  CBT, Solution Focused, Strength-based, Assertiveness Training, Supportive, Family Systems, and Reframing      Assessment: Patient currently experiencing anxiety due to adjustment concerns..  Patient's anxiety has improved since beginning SSRI/SNRI.  Patient's is more active in completing goals and is experiencing improved sleep and daily functioning.   Patient may benefit from continued counseling and ACT.   Plan: Follow up with CSW: in 3 weeks Behavioral recommendations: N/A Referral(s): Other medical:  N/A       Adelene Amas, LCSW

## 2022-02-22 DIAGNOSIS — Z01818 Encounter for other preprocedural examination: Secondary | ICD-10-CM | POA: Diagnosis not present

## 2022-02-22 DIAGNOSIS — H269 Unspecified cataract: Secondary | ICD-10-CM | POA: Diagnosis not present

## 2022-02-22 DIAGNOSIS — H25811 Combined forms of age-related cataract, right eye: Secondary | ICD-10-CM | POA: Diagnosis not present

## 2022-02-26 ENCOUNTER — Other Ambulatory Visit: Payer: Self-pay | Admitting: Psychiatry

## 2022-03-03 NOTE — Progress Notes (Unsigned)
BH MD/PA/NP OP Progress Note  03/05/2022 10:34 AM Carrie Roberson  MRN:  [id number]  Chief Complaint:  Chief Complaint  Patient presents with   Follow-up   HPI:  This is a follow-up appointment for depression and anxiety.  She states that she had cataract surgery on her right eye, and will have on her left eye.  She feels amazed, and realize how the color was distorted.  She is looking forward to start sewing again.  She reports improvement in UTI symptoms after being seen by a urologist.  She does not feel apprehensive about these anymore.  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  She reports fair relationship with her sister.  She feels comfortable to live together.  She enjoys seeing her sister's grandson, who is 71-year-old.  Although she describes herself as introverted, and does not see anybody else, she feels fortunate to have 4 close friends, who are there for her.  She is concerned but not stressed about anything.  Although she still does not want to see a commercial about children with cancer, she does not get tearful as before.  She wonders if she is indifferent to things.  She feels comfortable to stay on venlafaxine otherwise.  She has insomnia due to nocturia.  She tries not to take medication due to her experience of being on many medication when she underwent cancer surgery.  She denies feeling depressed.  She denies SI.  She denies panic attacks.  She took lorazepam a few times for anxiety.   Household: sister, two dogs Marital status: single Number of children: 0 Employment: retired in 2002, Barrister's clerk:   She was raised in [place]. She has three siblings. Her brother deceased from drowning accident at age 93. She was 76 year old that time. She states that she thinks about him every day, and has never passed it.  She was taking care of her mother, who passed away at age 35. Although she misses her mother, she thinks it was better for her as her mother did not have good  quality of life later in the course.  Visit Diagnosis:    ICD-10-CM   1. Generalized anxiety disorder  F41.1     2. MDD (major depressive disorder), recurrent, in partial remission ([place])  F33.41       Past Psychiatric History: Please see initial evaluation for full details. I have reviewed the history. No updates at this time.     Past Medical History:  Past Medical History:  Diagnosis Date   Anxiety    Arthritis    Diverticulitis    Family history of breast cancer    Family history of leukemia    GERD (gastroesophageal reflux disease)    History of ischemic colitis 09/02/2017   Hx of colonic polyp    Hypertension    H/O-PT TAKEN OFF BY PCP AS OF 2019   IBS (irritable bowel syndrome)    Ovarian cancer ([place])    Ovarian cyst    Personal history of chemotherapy 2021   6 treatments   Vaginal atrophy     Past Surgical History:  Procedure Laterality Date   ABDOMINAL HYSTERECTOMY     BREAST BIOPSY Left    needle bx/clip-neg   CERVICAL BIOPSY  W/ LOOP ELECTRODE EXCISION     CHOLECYSTECTOMY     Colonoscopoy  2007, 2008, 2011   COLONOSCOPY WITH PROPOFOL N/A 05/05/2018   Procedure: COLONOSCOPY WITH PROPOFOL;  Surgeon: Lin Landsman, MD;  Location: [facility];  Service: Gastroenterology;  Laterality: N/A;   FLEXIBLE SIGMOIDOSCOPY N/A 07/20/2021   Procedure: FLEXIBLE SIGMOIDOSCOPY;  Surgeon: Lesly Rubenstein, MD;  Location: [facility];  Service: Endoscopy;  Laterality: N/A;   HYSTERECTOMY ABDOMINAL WITH SALPINGO-OOPHORECTOMY N/A 08/19/2019   Procedure: HYSTERECTOMY ABDOMINAL WITH SALPINGO-OOPHORECTOMY WITH PELVIC WASHINGS PERITONEAL BIOPSIES AND PARA AORTIC LYMPH NODE DISSECTION;  Surgeon: [name], MD;  Location: [facility];  Service: Gynecology;  Laterality: N/A;   LEEP     OMENTECTOMY N/A 08/19/2019   Procedure: OMENTECTOMY;  Surgeon: [name], MD;  Location: [facility];  Service: Gynecology;  Laterality: N/A;   OOPHORECTOMY     PORTA CATH  INSERTION N/A 09/11/2019   Procedure: PORTA CATH INSERTION;  Surgeon: Algernon Huxley, MD;  Location: [facility];  Service: Cardiovascular;  Laterality: N/A;   TUBAL LIGATION  1999    Family Psychiatric History: Please see initial evaluation for full details. I have reviewed the history. No updates at this time.     Family History:  Family History  Problem Relation Age of Onset   Heart failure Mother    Atrial fibrillation Mother    Hypertension Mother    Diabetes Father    Heart attack Father    Breast cancer Maternal Aunt 69   Breast cancer Maternal Grandmother 4   Breast cancer Cousin        2 mat cousins   Hypertension Sister    Diabetes Sister    Blindness Sister    Hypertension Sister    Arthritis Sister    Leukemia Paternal Aunt    Leukemia Paternal Uncle    Brain cancer Paternal Aunt    Other Brother 28       Drowning accident   Liver cancer Maternal Uncle    Alcohol abuse Maternal Uncle    Prostate cancer Neg Hx    Kidney cancer Neg Hx    BRCA 1/2 Neg Hx     Social History:  Social History   Socioeconomic History   Marital status: Single    Spouse name: Not on file   Number of children: 0   Years of education: Not on file   Highest education level: Associate degree: academic program  Occupational History   Occupation: Retired  Tobacco Use   Smoking status: Never   Smokeless tobacco: Never   Tobacco comments:    2bd hand smoke from father  Vaping Use   Vaping Use: Never used  Substance and Sexual Activity   Alcohol use: Not Currently    Comment: rarely   Drug use: No   Sexual activity: Not Currently    Birth control/protection: Post-menopausal  Other Topics Concern   Not on file  Social History Narrative   Not on file   Social Determinants of Health   Financial Resource Strain: Low Risk  (09/13/2021)   Overall Financial Resource Strain (CARDIA)    Difficulty of Paying Living Expenses: Not hard at all  Food Insecurity: No Food  Insecurity (09/13/2021)   Hunger Vital Sign    Worried About Running Out of Food in the Last Year: Never true    Ran Out of Food in the Last Year: Never true  Transportation Needs: No Transportation Needs (09/13/2021)   PRAPARE - Transportation    Lack of Transportation (Medical): No    Lack of Transportation (Non-Medical): No  Physical Activity: Inactive (09/13/2021)   Exercise Vital Sign    Days of Exercise per Week: 0 days  Minutes of Exercise per Session: 0 min  Stress: Stress Concern Present (09/13/2021)   [place]    Feeling of Stress : Very much  Social Connections: Socially Isolated (09/13/2021)   Social Connection and Isolation Panel [NHANES]    Frequency of Communication with Friends and Family: Once a week    Frequency of Social Gatherings with Friends and Family: Never    Attends Religious Services: Never    Marine scientist or Organizations: Yes    Attends Archivist Meetings: Never    Marital Status: Never married    Allergies:  Allergies  Allergen Reactions   Citalopram Nausea And Vomiting   Ivp Dye [Iodinated Contrast Media] Hives and Swelling    hives   Meloxicam     unknown   Adhesive [Tape] Rash   Amlodipine Rash   Ampicillin Rash    Did it involve swelling of the face/tongue/throat, SOB, or low BP? No Did it involve sudden or severe rash/hives, skin peeling, or any reaction on the inside of your mouth or nose? Yes Did you need to seek medical attention at a hospital or doctor's office? Yes When did it last happen?       10 + years If all above answers are "NO", may proceed with cephalosporin use.   Cefoxitin Rash   Chlorhexidine Rash    Chloraprep Scrub Teal   Estradiol Rash   Estropipate Rash   Olmesartan Medoxomil-Hctz Rash   Sertraline Rash        Sulfa Antibiotics Rash    Metabolic Disorder Labs: No results found for: "HGBA1C", "MPG" No results found for:  "PROLACTIN" Lab Results  Component Value Date   CHOL 188 09/02/2017   TRIG 131 09/02/2017   HDL 53 09/02/2017   CHOLHDL 3.5 09/02/2017   VLDL 26 09/02/2017   LDLCALC 109 (H) 09/02/2017   Lab Results  Component Value Date   TSH 1.535 07/19/2021   TSH 0.856 09/02/2017    Therapeutic Level Labs: No results found for: "LITHIUM" No results found for: "VALPROATE" No results found for: "CBMZ"  Current Medications: Current Outpatient Medications  Medication Sig Dispense Refill   acetaminophen (TYLENOL) 500 MG tablet Take 1,000 mg by mouth every 6 (six) hours as needed (for pain.).     Cranberry 300 MG tablet Take 300 mg by mouth 2 (two) times daily.     fluticasone (FLONASE) 50 MCG/ACT nasal spray Place 1 spray into both nostrils daily. otc     lisinopril (ZESTRIL) 5 MG tablet TAKE 1 TABLET (5 MG TOTAL) BY MOUTH DAILY. 90 tablet 1   loratadine (CLARITIN) 10 MG tablet Take 10 mg by mouth daily. otc     mirabegron ER (MYRBETRIQ) 50 MG TB24 tablet Take 1 tablet (50 mg total) by mouth daily. 30 tablet 11   nitrofurantoin, macrocrystal-monohydrate, (MACROBID) 100 MG capsule Take 1 capsule (100 mg total) by mouth daily. 30 capsule 2   ofloxacin (OCUFLOX) 0.3 % ophthalmic solution Place into the right eye.     omeprazole (PRILOSEC) 10 MG capsule Take 10 mg by mouth daily.     Polyethylene Glycol 3350 (MIRALAX PO) Take by mouth.     prednisoLONE acetate (PRED FORTE) 1 % ophthalmic suspension Place into the right eye.     pregabalin (LYRICA) 75 MG capsule Take 1 capsule (75 mg total) by mouth 2 (two) times daily. 60 capsule 3   [START ON 03/10/2022] venlafaxine XR (EFFEXOR-XR) 75 MG  24 hr capsule Take 1 capsule (75 mg total) by mouth daily with breakfast. 90 capsule 0   No current facility-administered medications for this visit.   Facility-Administered Medications Ordered in Other Visits  Medication Dose Route Frequency Provider Last Rate Last Admin   sodium chloride flush (NS) 0.9 %  injection 10 mL  10 mL Intravenous PRN Sindy Guadeloupe, MD   10 mL at 01/05/20 0750     Musculoskeletal: Strength & Muscle Tone: within normal limits Gait & Station: normal Patient leans: N/A  Psychiatric Specialty Exam: Review of Systems  Psychiatric/Behavioral:  Positive for sleep disturbance. Negative for agitation, behavioral problems, confusion, decreased concentration, dysphoric mood, hallucinations, self-injury and suicidal ideas. The patient is not nervous/anxious and is not hyperactive.   All other systems reviewed and are negative.   Blood pressure (!) 146/87, pulse 74, temperature 97.8 F (36.6 C), temperature source Temporal, weight 168 lb 12.8 oz (76.6 kg).Body mass index is 30.87 kg/m.  General Appearance: Fairly Groomed  Eye Contact:  Good  Speech:  Clear and Coherent  Volume:  Normal  Mood:   good  Affect:  Appropriate, Congruent, and Full Range  Thought Process:  Coherent  Orientation:  Full (Time, Place, and Person)  Thought Content: Logical   Suicidal Thoughts:  No  Homicidal Thoughts:  No  Memory:  Immediate;   Good  Judgement:  Good  Insight:  Good  Psychomotor Activity:  Normal  Concentration:  Concentration: Good and Attention Span: Good  Recall:  Good  Fund of Knowledge: Good  Language: Good  Akathisia:  No  Handed:  Right  AIMS (if indicated): not done  Assets:  Communication Skills Desire for Improvement  ADL's:  Intact  Cognition: WNL  Sleep:  Poor   Screenings: GAD-7    Flowsheet Row Office Visit from 03/05/2022 in [facility] Office Visit from 10/27/2021 in [facility] Office Visit from 03/30/2021 in [facility] Office Visit from 03/29/2020 in [facility] Office Visit from 02[facility]23/2020 in [facility]  Total GAD-7 Score 0 0 _0 PHQ2-9    [place] Office Visit from 03/05/2022 in [place] Office Visit from 01/08/2022 in [place] Office Visit from 12/05/2021 in [place] from 10/27/2021 in [facility] from 09/13/2021 in [facility] [place]  PHQ-2 Total Score 0 0 4 0 6  PHQ-9 Total Score -- 3 11 0 15      [place] Visit from 01/08/2022 in [facility] from 12/07/2021 in [facility] [place] from 12/05/2021 in [place] No Risk No Risk No Risk        Assessment and Plan:  Carrie Roberson is a 76 y.o. 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   1. Generalized anxiety disorder 2. MDD (major depressive disorder), recurrent, in partial remission ([place]) She denies any significant mood symptoms since starting venlafaxine.  Psychosocial stressors includes history of ovarian cancer, loss of her mother, and grief of loss of her brother, who passed away at age 42.  She agrees to stay on venlafaxine at the current dose as maintenance treatment for  depression and then anxiety at least for 6 to 9 months.  Noted that although she reports concern of lack of anxiety, she does not have alexithymia, and she was reassured.  Will continue lorazepam as needed for extreme anxiety.   # Hypertension She continues to have hypertension on visit.  She was advised to measure her home blood pressure, and check with her PCP for father evaluation.    # Insomnia Unchanged. She reports initial and middle insomnia, which she partly attributes to nocturia.  Discussed sleep hygiene.  She was advised to try melatonin for insomnia.     Plan Continue venlafaxine 75 mg daily- monitor hypertension Continue lorazepam 0.5 mg daily as needed for anxiety (she rarely takes  this. She declined refill this time) Try melatonin 3 mg at night, 2 hours before going to bed Next appointment: 10/9 at 11:30 AM for 30 mins, in person   The patient demonstrates the following risk factors for suicide: Chronic risk factors for suicide include: psychiatric disorder of depression, anxiety . Acute risk factors for suicide include: unemployment and loss (financial, interpersonal, professional). Protective factors for this patient include: positive social support, coping skills, and hope for the future. Considering these factors, the overall suicide risk at this point appears to be low. Patient is appropriate for outpatient follow up.      Collaboration of Care: Collaboration of Care: Other reviewed note from LCSW  Patient/Guardian was advised Release of Information must be obtained prior to any record release in order to collaborate their care with an outside provider. Patient/Guardian was advised if they have not already done so to contact the registration department to sign all necessary forms in order for [place] to release information regarding their care.   Consent: Patient/Guardian gives verbal consent for treatment and assignment of benefits for services provided during this visit. Patient/Guardian expressed understanding and agreed to proceed.    Norman Clay, MD 03/05/2022, 10:34 AM

## 2022-03-05 ENCOUNTER — Encounter: Payer: Self-pay | Admitting: Psychiatry

## 2022-03-05 ENCOUNTER — Ambulatory Visit (INDEPENDENT_AMBULATORY_CARE_PROVIDER_SITE_OTHER): Payer: Medicare Other | Admitting: Psychiatry

## 2022-03-05 VITALS — BP 146/87 | HR 74 | Temp 97.8°F | Wt 168.8 lb

## 2022-03-05 DIAGNOSIS — F411 Generalized anxiety disorder: Secondary | ICD-10-CM

## 2022-03-05 DIAGNOSIS — F3341 Major depressive disorder, recurrent, in partial remission: Secondary | ICD-10-CM | POA: Diagnosis not present

## 2022-03-05 MED ORDER — VENLAFAXINE HCL ER 75 MG PO CP24: 75.0000 mg | ORAL_CAPSULE | Freq: Every day | ORAL | 0 refills | Status: DC

## 2022-03-05 NOTE — Patient Instructions (Signed)
Continue venlafaxine 75 mg daily- monitor hypertension Continue lorazepam 0.5 mg daily as needed for anxiety  Try melatonin 3 mg at night, 2 hours before going to bed Next appointment: 10/9 at 11:30

## 2022-03-06 ENCOUNTER — Inpatient Hospital Stay: Payer: Medicare Other | Attending: Oncology | Admitting: Licensed Clinical Social Worker

## 2022-03-06 DIAGNOSIS — C561 Malignant neoplasm of right ovary: Secondary | ICD-10-CM

## 2022-03-06 NOTE — Progress Notes (Signed)
[place] CSW Counseling Note  Patient was referred by medical provider. Treatment type: Individual  Presenting Concerns: Patient and/or family reports the following symptoms/concerns: anxiety and depression Duration of problem: 1 years; Severity of problem: moderate   Orientation:oriented to person, place, time/date, situation, day of week, month of year, and year.   Affect: Appropriate Risk of harm to self or others: No plan to harm self or others  Patient and/or Family's Strengths/Protective Factors: Social connections, Social and Emotional competence, Concrete supports in place (healthy food, safe environments, etc.), Physical Health (exercise, healthy diet, medication compliance, etc.), and Caregiver has knowledge of parenting & child developmentAbility for insight  Active sense of humor  Average or above average intelligence  Capable of independent living  Engineer, drilling fund of knowledge  Motivation for treatment/growth  Physical Health  Special hobby/interest  Supportive family/friends  Work skills      Goals Addressed: Patient will:  Reduce symptoms of: anxiety and depression Increase knowledge and/or ability of: coping skills and self-management skills  Increase healthy adjustment to current life circumstances and Begin healthy grieving over loss   Progress towards Goals: Progressing   Interventions: Interventions utilized:  CBT, Strength-based, Supportive, and Family Systems      Assessment: Patient currently experiencing adjustment concerns post-treatment.  Patient is feeling better overall but has concerns about her current SSRI dosage and has concerns about forgetfulness. Patient stated she has spoken with her doctor and will continue at current dosage until their next appointments.  Patient discussed current fears and concerns.  CSW discussed different skills to manage stressors and triggers.   Patient may benefit from continued  supportive counseling   Plan: Follow up with CSW: in 1 weeks Behavioral recommendations: continue using skills Referral(s): Other medical:  N/A       Adelene Amas, LCSW

## 2022-03-15 ENCOUNTER — Inpatient Hospital Stay: Payer: Medicare Other | Admitting: Licensed Clinical Social Worker

## 2022-03-22 DIAGNOSIS — H269 Unspecified cataract: Secondary | ICD-10-CM | POA: Diagnosis not present

## 2022-03-22 DIAGNOSIS — H25812 Combined forms of age-related cataract, left eye: Secondary | ICD-10-CM | POA: Diagnosis not present

## 2022-04-09 ENCOUNTER — Other Ambulatory Visit: Payer: Self-pay | Admitting: Family Medicine

## 2022-04-10 ENCOUNTER — Other Ambulatory Visit: Payer: Self-pay | Admitting: Physician Assistant

## 2022-04-10 DIAGNOSIS — N390 Urinary tract infection, site not specified: Secondary | ICD-10-CM

## 2022-04-11 ENCOUNTER — Inpatient Hospital Stay: Payer: Medicare Other | Attending: Oncology

## 2022-04-11 DIAGNOSIS — Z95828 Presence of other vascular implants and grafts: Secondary | ICD-10-CM

## 2022-04-11 DIAGNOSIS — C561 Malignant neoplasm of right ovary: Secondary | ICD-10-CM | POA: Insufficient documentation

## 2022-04-11 DIAGNOSIS — Z452 Encounter for adjustment and management of vascular access device: Secondary | ICD-10-CM | POA: Insufficient documentation

## 2022-04-11 MED ORDER — SODIUM CHLORIDE 0.9% FLUSH
10.0000 mL | Freq: Once | INTRAVENOUS | Status: AC
  Administered 2022-04-11: 10 mL via INTRAVENOUS
  Filled 2022-04-11: qty 10

## 2022-04-11 MED ORDER — HEPARIN SOD (PORK) LOCK FLUSH 100 UNIT/ML IV SOLN
500.0000 [IU] | Freq: Once | INTRAVENOUS | Status: AC
  Administered 2022-04-11: 500 [IU] via INTRAVENOUS
  Filled 2022-04-11: qty 5

## 2022-04-19 ENCOUNTER — Encounter: Payer: Self-pay | Admitting: Oncology

## 2022-04-19 NOTE — Telephone Encounter (Signed)
Appointment with LCSW on 04/26/2022 at 1:30PM

## 2022-04-20 DIAGNOSIS — Z01419 Encounter for gynecological examination (general) (routine) without abnormal findings: Secondary | ICD-10-CM | POA: Diagnosis not present

## 2022-04-20 DIAGNOSIS — Z780 Asymptomatic menopausal state: Secondary | ICD-10-CM | POA: Diagnosis not present

## 2022-04-26 ENCOUNTER — Encounter: Payer: Self-pay | Admitting: Licensed Clinical Social Worker

## 2022-04-26 ENCOUNTER — Inpatient Hospital Stay: Payer: Medicare Other | Attending: Oncology | Admitting: Licensed Clinical Social Worker

## 2022-04-26 DIAGNOSIS — C561 Malignant neoplasm of right ovary: Secondary | ICD-10-CM | POA: Insufficient documentation

## 2022-04-26 NOTE — Progress Notes (Signed)
[place] CSW Counseling Note  Patient was referred by medical provider. Treatment type: Individual  Presenting Concerns: Patient and/or family reports the following symptoms/concerns: anxiety, depression, and stress and adjustment Duration of problem: 1 years; Severity of problem: mild   Orientation:oriented to person, place, time/date, situation, day of week, month of year, and year.   Affect: Appropriate Risk of harm to self or others: No plan to harm self or others  Patient and/or Family's Strengths/Protective Factors: Social and Emotional competence, Concrete supports in place (healthy food, safe environments, etc.), and Physical Health (exercise, healthy diet, medication compliance, etc.)Ability for insight  Active sense of humor  Average or above average intelligence  Capable of independent living  Communication skills  Financial means  General fund of knowledge  Motivation for treatment/growth  Physical Health  Special hobby/interest  Supportive family/friends  Work skills      Goals Addressed: Patient will:  Reduce symptoms of: anxiety, depression, and stress Increase knowledge and/or ability of: coping skills, healthy habits, and stress reduction  Increase healthy adjustment to current life circumstances, Increase adequate support systems for patient/family, and increase self-advocacy, communication and boundary setting   Progress towards Goals: Progressing   Interventions: Interventions utilized:  CBT, Solution Focused, Strength-based, Supportive, Family Systems, and Reframing , ACT     Assessment: Patient currently experiencing  adjustment concerns post-treatment.  Patient is feeling better overall but has concerns about her current SSRI dosage and has concerns about forgetfulness and falling.  Patient is a scheduled to meet with psychiatrist and she will discuss her concerns.  Discussed different ways to manage anxiety about new experiences, but stated she feels she  is more flexible and improving overall.  CSW discussed different skills to manage stressors and triggers.      Plan: Follow up with CSW: 1 week Behavioral recommendations: continue using different skills to manage stressors and triggers. Referral(s): Support group(s):  N/A         Adelene Amas, LCSW

## 2022-04-26 NOTE — Progress Notes (Signed)
BH MD/PA/NP OP Progress Note  04/30/2022 12:06 PM Carrie Roberson  MRN:  [id number]  Chief Complaint:  Chief Complaint  Patient presents with   Follow-up   Medication Problem   HPI:  This is a follow-up appointment for depression and anxiety.  She states that she is concerned about her balance.  She loses it when she is standing.  She denies loss of consciousness, headache, dizziness, or tinnitus.  She thinks it her started since being on venlafaxine, and feels that the current dose is too strong.  She has been doing well otherwise.  She has been able to work on clutter in the house.  She has started sewing again.  She wishes to care more about other things.  She states that she used to take world's problems and dwell on those.  She does not care about those anymore.  She denies feeling indifferent, and is able to enjoy things.  She feels that she is not enough control of things. She agrees that she feels different now that her anxiety about others are lifted.  She sleeps well.  She gained a few pounds, which she attributes to a healthy diet and lack of activity.  She is not interested in physical activity; she has been this way for many years.  She denies SI. She denies anxiety. She denies alcohol use, drug use or cigarette use.  She has not taken clonazepam since the last visit.   Wt Readings from Last 3 Encounters:  04/30/22 173 lb 3.2 oz (78.6 kg)  03/05/22 168 lb 12.8 oz (76.6 kg)  01/16/22 165 lb (74.8 kg)     Visit Diagnosis:    ICD-10-CM   1. Generalized anxiety disorder  F41.1     2. MDD (major depressive disorder), recurrent, in partial remission ([place])  F33.41       Past Psychiatric History: Please see initial evaluation for full details. I have reviewed the history. No updates at this time.     Past Medical History:  Past Medical History:  Diagnosis Date   Anxiety    Arthritis    Diverticulitis    Family history of breast cancer    Family history of leukemia    GERD  (gastroesophageal reflux disease)    History of ischemic colitis 09/02/2017   Hx of colonic polyp    Hypertension    H/O-PT TAKEN OFF BY PCP AS OF 2019   IBS (irritable bowel syndrome)    Ovarian cancer ([place])    Ovarian cyst    Personal history of chemotherapy 2021   6 treatments   Vaginal atrophy     Past Surgical History:  Procedure Laterality Date   ABDOMINAL HYSTERECTOMY     BREAST BIOPSY Left    needle bx/clip-neg   CERVICAL BIOPSY  W/ LOOP ELECTRODE EXCISION     CHOLECYSTECTOMY     Colonoscopoy  2007, 2008, 2011   COLONOSCOPY WITH PROPOFOL N/A 05/05/2018   Procedure: COLONOSCOPY WITH PROPOFOL;  Surgeon: Lin Landsman, MD;  Location: [facility];  Service: Gastroenterology;  Laterality: N/A;   FLEXIBLE SIGMOIDOSCOPY N/A 07/20/2021   Procedure: FLEXIBLE SIGMOIDOSCOPY;  Surgeon: Lesly Rubenstein, MD;  Location: [facility] END[facility];  Service: Endoscopy;  Laterality: N/A;   HYSTERECTOMY ABDOMINAL WITH SALPINGO-OOPHORECTOMY N/A 08/19/2019   Procedure: HYSTERECTOMY ABDOMINAL WITH SALPINGO-OOPHORECTOMY WITH PELVIC WASHINGS PERITONEAL BIOPSIES AND PARA AORTIC LYMPH NODE DISSECTION;  Surgeon: [name], MD;  Location: [facility] OR[facility];  Service: Gynecology;  Laterality: N/A;   LEEP  OMENTECTOMY N/A 08/19/2019   Procedure: OMENTECTOMY;  Surgeon: [name], MD;  Location: [facility];  Service: Gynecology;  Laterality: N/A;   OOPHORECTOMY     PORTA CATH INSERTION N/A 09/11/2019   Procedure: PORTA CATH INSERTION;  Surgeon: Algernon Huxley, MD;  Location: [facility];  Service: Cardiovascular;  Laterality: N/A;   TUBAL LIGATION  1999    Family Psychiatric History: Please see initial evaluation for full details. I have reviewed the history. No updates at this time.     Family History:  Family History  Problem Relation Age of Onset   Heart failure Mother    Atrial fibrillation Mother    Hypertension Mother    Diabetes Father    Heart attack Father    Breast cancer  Maternal Aunt 68   Breast cancer Maternal Grandmother 71   Breast cancer Cousin        2 mat cousins   Hypertension Sister    Diabetes Sister    Blindness Sister    Hypertension Sister    Arthritis Sister    Leukemia Paternal Aunt    Leukemia Paternal Uncle    Brain cancer Paternal Aunt    Other Brother 30       Drowning accident   Liver cancer Maternal Uncle    Alcohol abuse Maternal Uncle    Prostate cancer Neg Hx    Kidney cancer Neg Hx    BRCA 1/2 Neg Hx     Social History:  Social History   Socioeconomic History   Marital status: Single    Spouse name: Not on file   Number of children: 0   Years of education: Not on file   Highest education level: Associate degree: academic program  Occupational History   Occupation: Retired  Tobacco Use   Smoking status: Never   Smokeless tobacco: Never   Tobacco comments:    2bd hand smoke from father  Vaping Use   Vaping Use: Never used  Substance and Sexual Activity   Alcohol use: Not Currently    Comment: rarely   Drug use: Not on file   Sexual activity: Not on file  Other Topics Concern   Not on file  Social History Narrative   Not on file   Social Determinants of Health   Financial Resource Strain: Low Risk  (04/26/2022)   Overall Financial Resource Strain (CARDIA)    Difficulty of Paying Living Expenses: Not hard at all  Food Insecurity: No Food Insecurity (04/26/2022)   Hunger Vital Sign    Worried About Running Out of Food in the Last Year: Never true    [place] in the Last Year: Never true  Transportation Needs: No Transportation Needs (04/26/2022)   PRAPARE - Hydrologist (Medical): No    Lack of Transportation (Non-Medical): No  Physical Activity: Inactive (04/26/2022)   Exercise Vital Sign    Days of Exercise per Week: 0 days    Minutes of Exercise per Session: 0 min  Stress: Stress Concern Present (04/26/2022)   [place]    Feeling of Stress : To some extent  Social Connections: Socially Isolated (04/26/2022)   Social Connection and Isolation Panel [NHANES]    Frequency of Communication with Friends and Family: More than three times a week    Frequency of Social Gatherings with Friends and Family: Once a week    Attends Religious Services: Never  Active Member of Clubs or Organizations: No    Attends Archivist Meetings: Never    Marital Status: Never married    Allergies:  Allergies  Allergen Reactions   Citalopram Nausea And Vomiting   Ivp Dye [Iodinated Contrast Media] Hives and Swelling    hives   Meloxicam     unknown   Adhesive [Tape] Rash   Amlodipine Rash   Ampicillin Rash    Did it involve swelling of the face/tongue/throat, SOB, or low BP? No Did it involve sudden or severe rash/hives, skin peeling, or any reaction on the inside of your mouth or nose? Yes Did you need to seek medical attention at a hospital or doctor's office? Yes When did it last happen?       10 + years If all above answers are "NO", may proceed with cephalosporin use.   Cefoxitin Rash   Chlorhexidine Rash    Chloraprep Scrub Teal   Estradiol Rash   Estropipate Rash   Olmesartan Medoxomil-Hctz Rash   Sertraline Rash        Sulfa Antibiotics Rash    Metabolic Disorder Labs: No results found for: "HGBA1C", "MPG" No results found for: "PROLACTIN" Lab Results  Component Value Date   CHOL 188 09/02/2017   TRIG 131 09/02/2017   HDL 53 09/02/2017   CHOLHDL 3.5 09/02/2017   VLDL 26 09/02/2017   LDLCALC 109 (H) 09/02/2017   Lab Results  Component Value Date   TSH 1.535 07/19/2021   TSH 0.856 09/02/2017    Therapeutic Level Labs: No results found for: "LITHIUM" No results found for: "VALPROATE" No results found for: "CBMZ"  Current Medications: Current Outpatient Medications  Medication Sig Dispense Refill   acetaminophen (TYLENOL) 500 MG tablet Take 1,000 mg by mouth  every 6 (six) hours as needed (for pain.).     Cranberry 300 MG tablet Take 300 mg by mouth 2 (two) times daily.     fluticasone (FLONASE) 50 MCG/ACT nasal spray Place 1 spray into both nostrils daily. otc     lisinopril (ZESTRIL) 5 MG tablet TAKE 1 TABLET (5 MG TOTAL) BY MOUTH DAILY. 90 tablet 1   loratadine (CLARITIN) 10 MG tablet Take 10 mg by mouth daily. otc     mirabegron ER (MYRBETRIQ) 50 MG TB24 tablet Take 1 tablet (50 mg total) by mouth daily. 30 tablet 11   nitrofurantoin, macrocrystal-monohydrate, (MACROBID) 100 MG capsule TAKE 1 CAPSULE BY MOUTH EVERY DAY 30 capsule 2   ofloxacin (OCUFLOX) 0.3 % ophthalmic solution Place into the right eye.     omeprazole (PRILOSEC) 10 MG capsule Take 10 mg by mouth daily.     Polyethylene Glycol 3350 (MIRALAX PO) Take by mouth.     pregabalin (LYRICA) 75 MG capsule Take 1 capsule (75 mg total) by mouth 2 (two) times daily. (Patient not taking: Reported on 04/30/2022) 60 capsule 3   No current facility-administered medications for this visit.   Facility-Administered Medications Ordered in Other Visits  Medication Dose Route Frequency Provider Last Rate Last Admin   sodium chloride flush (NS) 0.9 % injection 10 mL  10 mL Intravenous PRN Sindy Guadeloupe, MD   10 mL at 01/05/20 0750     Musculoskeletal: Strength & Muscle Tone: within normal limits Gait & Station: normal Patient leans: N/A  Psychiatric Specialty Exam: Review of Systems  Psychiatric/Behavioral: Negative.    All other systems reviewed and are negative.   Blood pressure 137/89, pulse 89, temperature 97.9 F (36.6 C),  temperature source Temporal, height 5' 2"  (1.575 m), weight 173 lb 3.2 oz (78.6 kg).Body mass index is 31.68 kg/m.  General Appearance: Fairly Groomed  Eye Contact:  Good  Speech:  Clear and Coherent  Volume:  Normal  Mood:   good  Affect:  Appropriate, Congruent, and calm  Thought Process:  Coherent  Orientation:  Full (Time, Place, and Person)  Thought  Content: Logical   Suicidal Thoughts:  No  Homicidal Thoughts:  No  Memory:  Immediate;   Good  Judgement:  Good  Insight:  Good  Psychomotor Activity:  Normal  Concentration:  Concentration: Good and Attention Span: Good  Recall:  Good  Fund of Knowledge: Good  Language: Good  Akathisia:  No  Handed:  Right  AIMS (if indicated): not done  Assets:  Communication Skills Desire for Improvement  ADL's:  Intact  Cognition: WNL  Sleep:  Good   Screenings: GAD-7    Flowsheet Row Office Visit from 03/05/2022 in [facility] Office Visit from 10/27/2021 in [place] [facility] at [place] Visit from 03/30/2021 in [place] and [facility] at [place] Visit from 03/29/2020 in [place] and [facility] at [place] from 02/23/2020 in [place] and [facility] at [facility]  Total GAD-7 Score 0 0 2 2 1       PHQ2-9    Flowsheet Row Clinical Support from 04/26/2022 in [place] at [place] Visit from 03/05/2022 in [facility] [facility] from 01/08/2022 in [facility] from 12/05/2021 in [facility] from 10/27/2021 in [place] [facility] at [place]  PHQ-2 Total Score 2 0 0 4 0  PHQ-9 Total Score 5 -- 3 11 [place] Office Visit from 01/08/2022 in [facility] [facility] from 12/07/2021 in [facility] [place] from 12/05/2021 in [facility] No Risk No Risk No Risk        Assessment and Plan:  Carrie Roberson is a 76 y.o. 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     1. Generalized anxiety disorder 2. MDD (major depressive disorder), recurrent, in partial remission ([place]) There has been steady improvement in depressive symptoms and then anxiety since the last visit.   Psychosocial stressors includes history of ovarian cancer, loss of her mother, and grief of loss of her brother, who passed away at age 87.  She reports potential adverse reaction of imbalances from venlafaxine.  With April down the dose to see if this mitigates her symptoms.  Discussed potential risk of relapse in her mood symptoms.  She was advised to discuss with her PCP at her next visit about this to rule out any potential medical condition contributing to this.  Although she was prescribed lorazepam in the past for anxiety, she has not taken any since the last visit.  Discussed potential risk of drowsiness.   Plan Decrease venlafaxine 37.5 mg daily  Continue lorazepam 0.5 mg daily as needed for anxiety (she rarely takes this. She declined refill this time) Try melatonin 3 mg at night,  2 hours before going to bed Next appointment: 12/4 at 1 PM for 30 mins, in person - on probiotic   The patient demonstrates the following risk factors for suicide: Chronic risk factors for suicide include: psychiatric disorder of depression, anxiety . Acute risk factors for suicide include: unemployment and loss (financial, interpersonal, professional). Protective factors for this patient include: positive social support, coping skills, and hope for the future. Considering these factors, the overall suicide risk at this point appears to be low. Patient is appropriate for outpatient follow up.         Collaboration of Care: Collaboration of Care: Other reviewed notes in Epic  Patient/Guardian was advised Release of Information must be obtained prior to any record release in order to collaborate their care with an outside provider.  Patient/Guardian was advised if they have not already done so to contact the registration department to sign all necessary forms in order for [place] to release information regarding their care.   Consent: Patient/Guardian gives verbal consent for treatment and assignment of benefits for services provided during this visit. Patient/Guardian expressed understanding and agreed to proceed.    Norman Clay, MD 04/30/2022, 12:06 PM

## 2022-04-30 ENCOUNTER — Encounter: Payer: Self-pay | Admitting: Psychiatry

## 2022-04-30 ENCOUNTER — Ambulatory Visit (INDEPENDENT_AMBULATORY_CARE_PROVIDER_SITE_OTHER): Payer: Medicare Other | Admitting: Psychiatry

## 2022-04-30 VITALS — BP 137/89 | HR 89 | Temp 97.9°F | Ht 62.0 in | Wt 173.2 lb

## 2022-04-30 DIAGNOSIS — F3341 Major depressive disorder, recurrent, in partial remission: Secondary | ICD-10-CM | POA: Diagnosis not present

## 2022-04-30 DIAGNOSIS — F411 Generalized anxiety disorder: Secondary | ICD-10-CM

## 2022-04-30 MED ORDER — VENLAFAXINE HCL ER 37.5 MG PO CP24: 37.5000 mg | ORAL_CAPSULE | Freq: Every day | ORAL | 0 refills | Status: DC

## 2022-04-30 NOTE — Patient Instructions (Signed)
Decrease venlafaxine 37.5 mg daily  Continue lorazepam 0.5 mg daily as needed for anxiety  Try melatonin 3 mg at night, 2 hours before going to bed Next appointment: 12/4 at 1 P

## 2022-05-01 ENCOUNTER — Other Ambulatory Visit: Payer: Self-pay | Admitting: Obstetrics and Gynecology

## 2022-05-01 DIAGNOSIS — Z1231 Encounter for screening mammogram for malignant neoplasm of breast: Secondary | ICD-10-CM

## 2022-05-04 ENCOUNTER — Ambulatory Visit (INDEPENDENT_AMBULATORY_CARE_PROVIDER_SITE_OTHER): Payer: Medicare Other | Admitting: Family Medicine

## 2022-05-04 ENCOUNTER — Encounter: Payer: Self-pay | Admitting: Family Medicine

## 2022-05-04 VITALS — BP 128/78 | HR 84 | Ht 62.0 in | Wt 173.0 lb

## 2022-05-04 DIAGNOSIS — Z23 Encounter for immunization: Secondary | ICD-10-CM

## 2022-05-04 DIAGNOSIS — L501 Idiopathic urticaria: Secondary | ICD-10-CM

## 2022-05-04 MED ORDER — PREDNISONE 10 MG PO TABS: 10.0000 mg | ORAL_TABLET | Freq: Every day | ORAL | 0 refills | Status: DC

## 2022-05-04 NOTE — Progress Notes (Signed)
Date:  05/04/2022   Name:  Carrie Roberson   DOB:  April 17, 1946   MRN:  [id number]   Chief Complaint: Rash (Scratches and comes in whelps. Gets worse at night and after hot shower. Is taking Benadryl- "temporary fix") and Flu Vaccine  Rash Pertinent negatives include no cough, diarrhea, fever, shortness of breath or sore throat.    Lab Results  Component Value Date   NA 133 (L) 12/20/2021   K 4.1 12/20/2021   CO2 26 12/20/2021   GLUCOSE 98 12/20/2021   BUN 9 12/20/2021   CREATININE 0.78 12/20/2021   CALCIUM 9.9 12/20/2021   EGFR 90 07/28/2021   GFRNONAA >60 12/20/2021   Lab Results  Component Value Date   CHOL 188 09/02/2017   HDL 53 09/02/2017   LDLCALC 109 (H) 09/02/2017   TRIG 131 09/02/2017   CHOLHDL 3.5 09/02/2017   Lab Results  Component Value Date   TSH 1.535 07/19/2021   No results found for: "HGBA1C" Lab Results  Component Value Date   WBC 3.3 (L) 12/20/2021   HGB 15.4 (H) 12/20/2021   HCT 45.3 12/20/2021   MCV 91.7 12/20/2021   PLT 258 12/20/2021   Lab Results  Component Value Date   ALT 20 12/20/2021   AST 21 12/20/2021   ALKPHOS 76 12/20/2021   BILITOT 0.3 12/20/2021   No results found for: "25OHVITD2", "25OHVITD3", "VD25OH"   Review of Systems  Constitutional:  Negative for chills and fever.  HENT:  Negative for drooling, ear discharge, ear pain and sore throat.   Respiratory:  Negative for cough, shortness of breath and wheezing.   Cardiovascular:  Negative for chest pain, palpitations and leg swelling.  Gastrointestinal:  Negative for abdominal pain, blood in stool, constipation, diarrhea and nausea.  Endocrine: Negative for polydipsia.  Genitourinary:  Negative for dysuria, frequency, hematuria and urgency.  Musculoskeletal:  Negative for back pain, myalgias and neck pain.  Skin:  Positive for rash.  Allergic/Immunologic: Negative for environmental allergies.  Neurological:  Negative for dizziness and headaches.  Hematological:  Does not  bruise/bleed easily.  Psychiatric/Behavioral:  Negative for suicidal ideas. The patient is not nervous/anxious.     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[place]) 10/02/2019   Chemotherapy-induced nausea 10/02/2019   Thyroid nodule 10/02/2019   Hyponatremia 10/02/2019   B12 deficiency 09/18/2019   Family history of breast cancer    Family history of leukemia    Goals of care, counseling/discussion 09/07/2019   Ovarian mass 08/19/2019   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) 08/19/2019   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HCC)    Cyst of ovary 07/15/2019   Anxiety 09/02/2017   DDD (degenerative disc disease), 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 No Did it involve sudden or severe rash/hives, skin peeling, or any reaction on the inside of your mouth or nose? Yes Did you need to seek medical attention at a  hospital or doctor's office? Yes When did it last happen?       10 + years If all above answers are "NO", may proceed with cephalosporin use.   Cefoxitin Rash   Chlorhexidine Rash    Chloraprep Scrub Teal   Estradiol Rash   Estropipate Rash   Olmesartan Medoxomil-Hctz Rash   Sertraline Rash        Sulfa Antibiotics Rash    Past Surgical History:  Procedure Laterality Date   ABDOMINAL HYSTERECTOMY     BREAST BIOPSY Left    needle bx/clip-neg   CERVICAL BIOPSY  W/ LOOP ELECTRODE EXCISION     CHOLECYSTECTOMY     Colonoscopoy  2007, 2008, 2011   COLONOSCOPY WITH PROPOFOL N/A 05/05/2018   Procedure: COLONOSCOPY WITH PROPOFOL;  Surgeon: Lin Landsman, MD;  Location: [facility]COPY;  Service: Gastroenterology;  Laterality: N/A;   FLEXIBLE SIGMOIDOSCOPY N/A 07/20/2021   Procedure: FLEXIBLE SIGMOIDOSCOPY;  Surgeon: Lesly Rubenstein, MD;  Location: [facility] ENDOSCOPY;  Service: Endoscopy;  Laterality: N/A;   HYSTERECTOMY ABDOMINAL WITH SALPINGO-OOPHORECTOMY N/A 08/19/2019   Procedure: HYSTERECTOMY ABDOMINAL WITH SALPINGO-OOPHORECTOMY WITH PELVIC WASHINGS PERITONEAL BIOPSIES AND PARA AORTIC LYMPH NODE DISSECTION;  Surgeon: [name], MD;  Location: [facility] ORS;  Service: Gynecology;  Laterality: N/A;   LEEP     OMENTECTOMY N/A 08/19/2019   Procedure: OMENTECTOMY;  Surgeon: [name], MD;  Location: [facility] ORS;  Service: Gynecology;  Laterality: N/A;   OOPHORECTOMY     PORTA CATH INSERTION N/A 09/11/2019   Procedure: PORTA CATH INSERTION;  Surgeon: Algernon Huxley, MD;  Location: [place] [facility];  Service: Cardiovascular;  Laterality: N/A;   TUBAL LIGATION  1999    Social History   Tobacco Use   Smoking status: Never   Smokeless tobacco: Never   Tobacco comments:    2bd hand smoke from father  Vaping Use   Vaping Use: Never used  Substance Use Topics   Alcohol use: Not Currently    Comment: rarely     Medication list has been reviewed and updated.  Current  Meds  Medication Sig   acetaminophen (TYLENOL) 500 MG tablet Take 1,000 mg by mouth every 6 (six) hours as needed (for pain.).   lisinopril (ZESTRIL) 5 MG tablet TAKE 1 TABLET (5 MG TOTAL) BY MOUTH DAILY.   mirabegron ER (MYRBETRIQ) 50 MG TB24 tablet Take 1 tablet (50 mg total) by mouth daily.   nitrofurantoin, macrocrystal-monohydrate, (MACROBID) 100 MG capsule TAKE 1 CAPSULE BY MOUTH EVERY DAY   predniSONE (DELTASONE) 10 MG tablet Take 1 tablet (10 mg total) by mouth daily with breakfast.   Probiotic Product (UP4 PROBIOTICS WOMENS PO) Take 1 tablet by mouth daily.   venlafaxine XR (EFFEXOR XR) 37.5 MG 24 hr capsule Take 1 capsule (37.5 mg total) by mouth daily with breakfast.       05/04/2022    2:59 PM 03/05/2022   10:00 AM 10/27/2021   10:40 AM 03/30/2021    9:50 AM  GAD 7 :  Generalized Anxiety Score  Nervous, Anxious, on Edge 0  0 0  Control/stop worrying 0  0 1  Worry too much - different things 0  0 0  Trouble relaxing 0  0 1  Restless 0  0 0  Easily annoyed or irritable 0  0 0  Afraid - awful might happen 0  0 0  Total GAD 7 Score 0  0 2  Anxiety Difficulty Not difficult at all  Not difficult at all Not difficult at all     Information is confidential and restricted. Go to Review Flowsheets to unlock data.       05/04/2022    2:59 PM 04/26/2022    3:16 PM 03/05/2022   10:00 AM  Depression screen PHQ 2/9  Decreased Interest 0 1   Down, Depressed, Hopeless 0 1   PHQ - 2 Score 0 2   Altered sleeping 0 1   Tired, decreased energy 0 1   Change in appetite 0 0   Feeling bad or failure about yourself  0 0   Trouble concentrating 0 1   Moving slowly or fidgety/restless 0 0   Suicidal thoughts 0 0   PHQ-9 Score 0 5   Difficult doing work/chores Not difficult at all Somewhat difficult      Information is confidential and restricted. Go to Review Flowsheets to unlock data.    BP Readings from Last 3 Encounters:  05/04/22 128/78  01/16/22 123/74  01/08/22 (!) 155/78     Physical Exam Vitals and nursing note reviewed.  Constitutional:      Appearance: She is well-developed.  HENT:     Head: Normocephalic.     Right Ear: Tympanic membrane and external ear normal.     Left Ear: Tympanic membrane and external ear normal.     Nose: Nose normal.     Mouth/Throat:     Mouth: Mucous membranes are moist.  Eyes:     General: Lids are everted, no foreign bodies appreciated. No scleral icterus.       Left eye: No foreign body or hordeolum.     Conjunctiva/sclera: Conjunctivae normal.     Right eye: Right conjunctiva is not injected.     Left eye: Left conjunctiva is not injected.     Pupils: Pupils are equal, round, and reactive to light.  Neck:     Thyroid: No thyromegaly.     Vascular: No JVD.     Trachea: No tracheal deviation.  Cardiovascular:     Rate and Rhythm: Normal rate and regular rhythm.     Heart sounds: Normal heart sounds. No murmur heard.    No friction rub. No gallop.  Pulmonary:     Effort: Pulmonary effort is normal. No respiratory distress.     Breath sounds: Normal breath sounds. No wheezing, rhonchi or rales.  Abdominal:     General: Bowel sounds are normal.     Palpations: Abdomen is soft. There is no mass.     Tenderness: There is no abdominal tenderness. There is no guarding or rebound.  Musculoskeletal:        General: No tenderness. Normal range of motion.     Cervical back: Normal range of motion and neck supple.  Lymphadenopathy:     Cervical: No cervical adenopathy.  Skin:    General: Skin is warm.     Findings: Erythema present. No rash.     Comments: Dermatographism/pruriyic  Neurological:     Mental Status: She is  alert and oriented to person, place, and time.     Cranial Nerves: No cranial nerve deficit.     Deep Tendon Reflexes: Reflexes normal.  Psychiatric:        Mood and Affect: Mood is not anxious or depressed.     Wt Readings from Last 3 Encounters:  05/04/22 173 lb (78.5 kg)  01/16/22 165 lb  (74.8 kg)  01/08/22 163 lb (73.9 kg)    BP 128/78   Pulse 84   Ht _0  (1.575 m)   Wt 173 lb (78.5 kg)   BMI 31.64 kg/m   Assessment and Plan:  1. Chronic idiopathic urticaria New onset.  Persistent.  Generalized but primarily over the torso and extremities.  There is no evident rash.  But there is whelping noted particularly where patient has previously scratched.  This is consistent with urticaria and in reviewing medications the last 2 she has had is venlafaxine which she is in the process of reducing the dosage and she is currently on 37.5 but she has been on daily dosings of Macrodantin daily.  We have discussed that it would be best to discontinue the Macrodantin and consult her urologist whether this needs to be continued on a persistent basis.  I have suggested that she take Zyrtec in the morning and Benadryl at night.  I have given her prednisone but will just take 10 mg daily if necessary will increase.  I have offered her triamcinolone cream we will going to hold at this time since it is more episodic and generalized.  Dermatology consult has been placed for you evaluation and treatment if unresolved with cessation of antibiotic on a daily basis. - predniSONE (DELTASONE) 10 MG tablet; Take 1 tablet (10 mg total) by mouth daily with breakfast.  Dispense: 30 tablet; Refill: 0 - Ambulatory referral to Dermatology  2. Need for immunization against influenza Discussed and administered - Flu Vaccine QUAD High Dose(Fluad)    Otilio Miu, MD

## 2022-05-09 ENCOUNTER — Encounter: Payer: Self-pay | Admitting: Oncology

## 2022-05-09 DIAGNOSIS — L508 Other urticaria: Secondary | ICD-10-CM | POA: Diagnosis not present

## 2022-05-09 DIAGNOSIS — L570 Actinic keratosis: Secondary | ICD-10-CM | POA: Diagnosis not present

## 2022-05-09 DIAGNOSIS — D2271 Melanocytic nevi of right lower limb, including hip: Secondary | ICD-10-CM | POA: Diagnosis not present

## 2022-05-09 NOTE — Telephone Encounter (Signed)
Signing encounter, See previous note 11/15/21

## 2022-05-10 ENCOUNTER — Inpatient Hospital Stay: Payer: Medicare Other | Admitting: Licensed Clinical Social Worker

## 2022-05-14 ENCOUNTER — Inpatient Hospital Stay: Payer: Medicare Other | Admitting: Licensed Clinical Social Worker

## 2022-05-14 DIAGNOSIS — C561 Malignant neoplasm of right ovary: Secondary | ICD-10-CM

## 2022-05-14 NOTE — Progress Notes (Signed)
[facility] CSW Counseling Note  Patient was referred by medical provider. Treatment type: Individual  Presenting Concerns: Patient and/or family reports the following symptoms/concerns: anxiety, depression, and stress Duration of problem: 1 years; Severity of problem: mild   Orientation:oriented to person, place, time/date, situation, day of week, month of year, and year.   Affect: Appropriate Risk of harm to self or others: No plan to harm self or others  Patient and/or Family's Strengths/Protective Factors: Social connections, Social and Emotional competence, Concrete supports in place (healthy food, safe environments, etc.), Physical Health (exercise, healthy diet, medication compliance, etc.), and  Ability for insight  Active sense of humor  Average or above average intelligence  Capable of independent living  Engineer, drilling fund of knowledge  Motivation for treatment/growth  Physical Health  Religious Affiliation  Special hobby/interest  Supportive family/friends  Work skills      Goals Addressed: Patient will:  Reduce symptoms of: anxiety, depression, and stress Increase knowledge and/or ability of: coping skills, healthy habits, and stress reduction  Increase healthy adjustment to current life circumstances, Increase motivation to adhere to plan of care, Begin healthy grieving over loss, and improve adjustment to diagnosis   Progress towards Goals: Progressing   Interventions: Interventions utilized:  CBT, Solution Focused, Strength-based, Supportive, and Reframing      Assessment: Patient currently experiencing adjustment concerns post-treatment.  Patient is feeling better, after her medication was changed last week. Patient states her anxiety has increased a little bit since the medication changes. Discussed different ways to manage anxiety about new experiences, but stated she continues to feel she is more flexible and improving overall.   CSW discussed different skills to manage stressors and triggers.  .      Plan: Follow up with CSW: 1 week Behavioral recommendations: CBT, ACT, continue using skills Referral(s): Support group(s):  N/A         Adelene Amas, LCSW

## 2022-05-16 ENCOUNTER — Other Ambulatory Visit: Payer: Self-pay | Admitting: *Deleted

## 2022-05-16 DIAGNOSIS — C561 Malignant neoplasm of right ovary: Secondary | ICD-10-CM

## 2022-05-16 DIAGNOSIS — D509 Iron deficiency anemia, unspecified: Secondary | ICD-10-CM

## 2022-05-17 ENCOUNTER — Ambulatory Visit
Admission: RE | Admit: 2022-05-17 | Discharge: 2022-05-17 | Disposition: A | Discharge status: Home / Self Care | Payer: Medicare Other | Source: Ambulatory Visit | Attending: Obstetrics and Gynecology | Admitting: Obstetrics and Gynecology

## 2022-05-17 DIAGNOSIS — Z1231 Encounter for screening mammogram for malignant neoplasm of breast: Secondary | ICD-10-CM | POA: Diagnosis not present

## 2022-05-18 ENCOUNTER — Inpatient Hospital Stay: Payer: Medicare Other

## 2022-05-18 ENCOUNTER — Inpatient Hospital Stay (HOSPITAL_BASED_OUTPATIENT_CLINIC_OR_DEPARTMENT_OTHER): Payer: Medicare Other | Admitting: Oncology

## 2022-05-18 ENCOUNTER — Encounter: Payer: Self-pay | Admitting: Oncology

## 2022-05-18 VITALS — BP 160/69 | HR 71 | Temp 98.1°F | Resp 16 | Ht 62.0 in | Wt 173.0 lb

## 2022-05-18 DIAGNOSIS — Z08 Encounter for follow-up examination after completed treatment for malignant neoplasm: Secondary | ICD-10-CM | POA: Diagnosis not present

## 2022-05-18 DIAGNOSIS — C561 Malignant neoplasm of right ovary: Secondary | ICD-10-CM | POA: Diagnosis not present

## 2022-05-18 DIAGNOSIS — D509 Iron deficiency anemia, unspecified: Secondary | ICD-10-CM | POA: Diagnosis not present

## 2022-05-18 DIAGNOSIS — Z8543 Personal history of malignant neoplasm of ovary: Secondary | ICD-10-CM | POA: Diagnosis not present

## 2022-05-18 LAB — CBC WITH DIFFERENTIAL/PLATELET
Abs Immature Granulocytes: 0 10*3/uL (ref 0.00–0.07)
Basophils Absolute: 0 10*3/uL (ref 0.0–0.1)
Basophils Relative: 1 %
Eosinophils Absolute: 0.2 10*3/uL (ref 0.0–0.5)
Eosinophils Relative: 4 %
HCT: 43.1 % (ref 36.0–46.0)
Hemoglobin: 14.4 g/dL (ref 12.0–15.0)
Immature Granulocytes: 0 %
Lymphocytes Relative: 49 %
Lymphs Abs: 2 10*3/uL (ref 0.7–4.0)
MCH: 31 pg (ref 26.0–34.0)
MCHC: 33.4 g/dL (ref 30.0–36.0)
MCV: 92.9 fL (ref 80.0–100.0)
Monocytes Absolute: 0.5 10*3/uL (ref 0.1–1.0)
Monocytes Relative: 12 %
Neutro Abs: 1.4 10*3/uL — ABNORMAL LOW (ref 1.7–7.7)
Neutrophils Relative %: 34 %
Platelets: 255 10*3/uL (ref 150–400)
RBC: 4.64 MIL/uL (ref 3.87–5.11)
RDW: 12.3 % (ref 11.5–15.5)
WBC: 4 10*3/uL (ref 4.0–10.5)
nRBC: 0 % (ref 0.0–0.2)

## 2022-05-18 LAB — COMPREHENSIVE METABOLIC PANEL
ALT: 14 U/L (ref 0–44)
AST: 16 U/L (ref 15–41)
Albumin: 3.7 g/dL (ref 3.5–5.0)
Alkaline Phosphatase: 85 U/L (ref 38–126)
Anion gap: 5 (ref 5–15)
BUN: 9 mg/dL (ref 8–23)
CO2: 28 mmol/L (ref 22–32)
Calcium: 9.9 mg/dL (ref 8.9–10.3)
Chloride: 105 mmol/L (ref 98–111)
Creatinine, Ser: 0.7 mg/dL (ref 0.44–1.00)
GFR, Estimated: >60 mL/min (ref >60)
Glucose, Bld: 96 mg/dL (ref 70–99)
Potassium: 4.1 mmol/L (ref 3.5–5.1)
Sodium: 138 mmol/L (ref 135–145)
Total Bilirubin: 0.4 mg/dL (ref 0.3–1.2)
Total Protein: 6.7 g/dL (ref 6.5–8.1)

## 2022-05-18 LAB — IRON AND TIBC
Iron: 84 ug/dL (ref 28–170)
Saturation Ratios: 26 % (ref 10.4–31.8)
TIBC: 319 ug/dL (ref 250–450)
UIBC: 235 ug/dL

## 2022-05-18 LAB — FERRITIN: Ferritin: 45 ng/mL (ref 11–307)

## 2022-05-20 LAB — CA 125: Cancer Antigen (CA) 125: 8.7 U/mL (ref 0.0–38.1)

## 2022-05-21 ENCOUNTER — Encounter (INDEPENDENT_AMBULATORY_CARE_PROVIDER_SITE_OTHER): Payer: Self-pay

## 2022-05-22 ENCOUNTER — Inpatient Hospital Stay: Payer: Medicare Other | Admitting: Licensed Clinical Social Worker

## 2022-05-27 ENCOUNTER — Encounter: Payer: Self-pay | Admitting: Oncology

## 2022-05-27 NOTE — Progress Notes (Signed)
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  This was followed by an MR pelvis with and without contrast in December 2020 which showed a large cystic mass arising from the right ovary measuring 1.8 cm.  This was followed by TAH/BSO with peritoneal biopsies with pelvic/aortic lymph node dissection pelvic washings and omentectomy with Dr. Fransisca Connors and Dr. Georgianne Fick on 08/19/2019.  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  Angiolymphatic invasion is present.  Bilateral fallopian tubes were negative for atypia and malignancy.  Peritoneal stone excision negative for malignancy.  Uterus with cervix negative for atypia and malignancy.  Bladder, right gutter negative for malignancy.  5 lymph nodes were sampled and were negative for malignancy.  Pelvic washings were also negative for malignancy.  Pathologic staging FIGO 1 C2   Patient was seen by Dr. Fransisca Connors postoperatively and recommendation was to proceed with adjuvant chemotherapy carbotaxol for 6 cycles.   BRCA2 p.[id number] VUS found on the cancerNext germline panel.  The CancerNext gene panel offered by  [employer] includes sequencing and rearrangement analysis for the following 34 genes:   APC, ATM, BARD1, BMPR1A, BRCA1, BRCA2, BRIP1, CDH1, CDK4, CDKN2A, CHEK2, DICER1, HOXB13, EPCAM, GREM1, MLH1, MRE11A, MSH2, MSH6, MUTYH, NBN, NF1, PALB2, PMS2, POLD1, POLE, PTEN, RAD50, RAD51C, RAD51D, SMAD4, SMARCA4, STK11, and TP53.  The report date is 10/29/2019.   TumorHRD testing was negative.        Interval history-patient continues to follow-up with psychiatry for symptoms of survivors guilt for which she is on antidepressants.  Patient reports that her bowel movements are regular and she denies any abdominal pain or distention.  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  HENT:  Negative for congestion, ear discharge and nosebleeds.   Eyes:  Negative for blurred vision.  Respiratory:  Negative for cough, hemoptysis, sputum production, shortness of breath and wheezing.   Cardiovascular:  Negative for chest pain, palpitations, orthopnea and claudication.  Gastrointestinal:  Negative for abdominal pain, blood in stool, constipation, diarrhea, heartburn, melena, nausea and vomiting.  Genitourinary:  Negative for dysuria, flank pain, frequency, hematuria and urgency.  Musculoskeletal:  Negative for back pain, joint pain and myalgias.  Skin:  Negative for rash.  Neurological:  Negative for dizziness, tingling, focal weakness, seizures, weakness and headaches.  Endo/Heme/Allergies:  Does not bruise/bleed easily.  Psychiatric/Behavioral:  Negative for depression and suicidal ideas. The patient does not have insomnia.       Allergies  Allergen Reactions   Citalopram Nausea And Vomiting   Ivp Dye [Iodinated Contrast Media] Hives and Swelling    hives   Meloxicam  unknown   Adhesive [Tape] Rash   Amlodipine Rash   Ampicillin Rash    Did it involve  swelling of the face/tongue/throat, SOB, or low BP? No Did it involve sudden or severe rash/hives, skin peeling, or any reaction on the inside of your mouth or nose? Yes Did you need to seek medical attention at a hospital or doctor's office? Yes When did it last happen?       10 + years If all above answers are "NO", may proceed with cephalosporin use.   Cefoxitin Rash   Chlorhexidine Rash    Chloraprep Scrub Teal   Estradiol Rash   Estropipate Rash   Olmesartan Medoxomil-Hctz Rash   Sertraline Rash        Sulfa Antibiotics Rash     Past Medical History:  Diagnosis Date   Anxiety    Arthritis    Diverticulitis    Family history of breast cancer    Family history of leukemia    GERD (gastroesophageal reflux disease)    History of ischemic colitis 09/02/2017   Hx of colonic polyp    Hypertension    H/O-PT TAKEN OFF BY PCP AS OF 2019   IBS (irritable bowel syndrome)    Ovarian cancer ([place])    Ovarian cyst    Personal history of chemotherapy 2021   6 treatments   Vaginal atrophy      Past Surgical History:  Procedure Laterality Date   ABDOMINAL HYSTERECTOMY     BREAST BIOPSY Left    needle bx/clip-neg   BREAST BIOPSY Right 06/28/2021   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   CERVICAL BIOPSY  W/ LOOP ELECTRODE EXCISION     CHOLECYSTECTOMY     Colonoscopoy  2007, 2008, 2011   COLONOSCOPY WITH PROPOFOL N/A 05/05/2018   Procedure: COLONOSCOPY WITH PROPOFOL;  Surgeon: Lin Landsman, MD;  Location: [facility];  Service: Gastroenterology;  Laterality: N/A;   FLEXIBLE SIGMOIDOSCOPY N/A 07/20/2021   Procedure: FLEXIBLE SIGMOIDOSCOPY;  Surgeon: Lesly Rubenstein, MD;  Location: [facility];  Service: Endoscopy;  Laterality: N/A;   HYSTERECTOMY ABDOMINAL WITH SALPINGO-OOPHORECTOMY N/A 08/19/2019   Procedure: HYSTERECTOMY ABDOMINAL WITH SALPINGO-OOPHORECTOMY WITH PELVIC WASHINGS PERITONEAL BIOPSIES AND PARA AORTIC LYMPH NODE DISSECTION;  Surgeon:  [name], MD;  Location: [facility];  Service: Gynecology;  Laterality: N/A;   LEEP     OMENTECTOMY N/A 08/19/2019   Procedure: OMENTECTOMY;  Surgeon: [name], MD;  Location: [facility];  Service: Gynecology;  Laterality: N/A;   OOPHORECTOMY     PORTA CATH INSERTION N/A 09/11/2019   Procedure: PORTA CATH INSERTION;  Surgeon: Algernon Huxley, MD;  Location: [facility];  Service: Cardiovascular;  Laterality: N/A;   TUBAL LIGATION  1999    Social History   Socioeconomic History   Marital status: Single    Spouse name: Not on file   Number of children: 0   Years of education: Not on file   Highest education level: Associate degree: academic program  Occupational History   Occupation: Retired  Tobacco Use   Smoking status: Never   Smokeless tobacco: Never   Tobacco comments:    2bd hand smoke from father  Vaping Use   Vaping Use: Never used  Substance and Sexual Activity   Alcohol use: Not Currently    Comment: rarely   Drug use: Not on file   Sexual activity: Not on file  Other Topics Concern   Not on file  Social History Narrative  Not on file   Social Determinants of Health   Financial Resource Strain: Low Risk  (04/26/2022)   Overall Financial Resource Strain (CARDIA)    Difficulty of Paying Living Expenses: Not hard at all  Food Insecurity: No Food Insecurity (04/26/2022)   Hunger Vital Sign    Worried About Running Out of Food in the Last Year: Never true    Ran Out of Food in the Last Year: Never true  Transportation Needs: No Transportation Needs (04/26/2022)   PRAPARE - Hydrologist (Medical): No    Lack of Transportation (Non-Medical): No  Physical Activity: Inactive (04/26/2022)   Exercise Vital Sign    Days of Exercise per Week: 0 days    Minutes of Exercise per Session: 0 min  Stress: Stress Concern Present (04/26/2022)   [place]    Feeling  of Stress : To some extent  Social Connections: Socially Isolated (04/26/2022)   Social Connection and Isolation Panel [NHANES]    Frequency of Communication with Friends and Family: More than three times a week    Frequency of Social Gatherings with Friends and Family: Once a week    Attends Religious Services: Never    Marine scientist or Organizations: No    Attends Archivist Meetings: Never    Marital Status: Never married  Intimate Partner Violence: Not At Risk (04/26/2022)   Humiliation, Afraid, Rape, and Kick questionnaire    Fear of Current or Ex-Partner: No    Emotionally Abused: No    Physically Abused: No    Sexually Abused: No    Family History  Problem Relation Age of Onset   Heart failure Mother    Atrial fibrillation Mother    Hypertension Mother    Diabetes Father    Heart attack Father    Breast cancer Maternal Aunt 65   Breast cancer Maternal Grandmother 18   Breast cancer Cousin        2 mat cousins   Hypertension Sister    Diabetes Sister    Blindness Sister    Hypertension Sister    Arthritis Sister    Leukemia Paternal Aunt    Leukemia Paternal Uncle    Brain cancer Paternal Aunt    Other Brother 48       Drowning accident   Liver cancer Maternal Uncle    Alcohol abuse Maternal Uncle    Prostate cancer Neg Hx    Kidney cancer Neg Hx    BRCA 1/2 Neg Hx      Current Outpatient Medications:    acetaminophen (TYLENOL) 500 MG tablet, Take 1,000 mg by mouth every 6 (six) hours as needed (for pain.)., Disp: , Rfl:    hydrOXYzine (ATARAX) 25 MG tablet, Take 25 mg by mouth daily., Disp: , Rfl:    lisinopril (ZESTRIL) 5 MG tablet, TAKE 1 TABLET (5 MG TOTAL) BY MOUTH DAILY., Disp: 90 tablet, Rfl: 1   mirabegron ER (MYRBETRIQ) 50 MG TB24 tablet, Take 1 tablet (50 mg total) by mouth daily., Disp: 30 tablet, Rfl: 11   nitrofurantoin, macrocrystal-monohydrate, (MACROBID) 100 MG capsule, TAKE 1 CAPSULE BY MOUTH EVERY DAY, Disp: 30 capsule, Rfl:  2   Probiotic Product (UP4 PROBIOTICS WOMENS PO), Take 1 tablet by mouth daily., Disp: , Rfl:    venlafaxine XR (EFFEXOR XR) 37.5 MG 24 hr capsule, Take 1 capsule (37.5 mg total) by mouth daily with breakfast., Disp: 90 capsule,  Rfl: 0   XYZAL ALLERGY 24HR 5 MG tablet, Take 10 mg by mouth daily., Disp: , Rfl:    loratadine (CLARITIN) 10 MG tablet, Take 10 mg by mouth daily. otc (Patient not taking: Reported on 05/04/2022), Disp: , Rfl:    omeprazole (PRILOSEC) 10 MG capsule, Take 10 mg by mouth daily. (Patient not taking: Reported on 05/04/2022), Disp: , Rfl:    Polyethylene Glycol 3350 (MIRALAX PO), Take by mouth. (Patient not taking: Reported on 05/04/2022), Disp: , Rfl:    predniSONE (DELTASONE) 10 MG tablet, Take 1 tablet (10 mg total) by mouth daily with breakfast. (Patient not taking: Reported on 05/18/2022), Disp: 30 tablet, Rfl: 0   pregabalin (LYRICA) 75 MG capsule, Take 1 capsule (75 mg total) by mouth 2 (two) times daily. (Patient not taking: Reported on 04/30/2022), Disp: 60 capsule, Rfl: 3   triamcinolone (KENALOG) 0.025 % cream, Apply topically 2 (two) times daily. (Patient not taking: Reported on 05/18/2022), Disp: , Rfl:  No current facility-administered medications for this visit.  Facility-Administered Medications Ordered in Other Visits:    sodium chloride flush (NS) 0.9 % injection 10 mL, 10 mL, Intravenous, PRN, Sindy Guadeloupe, MD, 10 mL at 01/05/20 0750  Physical exam:  Vitals:   05/18/22 1453  BP: (!) 160/69  Pulse: 71  Resp: 16  Temp: 98.1 F (36.7 C)  TempSrc: Tympanic  SpO2: 98%  Weight: 173 lb (78.5 kg)  Height: _0  (1.575 m)   Physical Exam Cardiovascular:     Rate and Rhythm: Normal rate and regular rhythm.     Heart sounds: Normal heart sounds.  Pulmonary:     Effort: Pulmonary effort is normal.     Breath sounds: Normal breath sounds.  Abdominal:     General: Bowel sounds are normal. There is no distension.     Palpations: Abdomen is soft.      Tenderness: There is no abdominal tenderness.  Skin:    General: Skin is warm and dry.  Neurological:     Mental Status: She is alert and oriented to person, place, and time.         Latest Ref Rng & Units 05/18/2022    2:40 PM  CMP  Glucose 70 - 99 mg/dL 96   BUN 8 - 23 mg/dL 9   Creatinine 0.44 - 1.00 mg/dL 0.70   Sodium 135 - 145 mmol/L 138   Potassium 3.5 - 5.1 mmol/L 4.1   Chloride 98 - 111 mmol/L 105   CO2 22 - 32 mmol/L 28   Calcium 8.9 - 10.3 mg/dL 9.9   Total Protein 6.5 - 8.1 g/dL 6.7   Total Bilirubin 0.3 - 1.2 mg/dL 0.4   Alkaline Phos 38 - 126 U/L 85   AST 15 - 41 U/L 16   ALT 0 - 44 U/L 14       Latest Ref Rng & Units 05/18/2022    2:40 PM  CBC  WBC 4.0 - 10.5 K/uL 4.0   Hemoglobin 12.0 - 15.0 g/dL 14.4   Hematocrit 36.0 - 46.0 % 43.1   Platelets 150 - 400 K/uL 255     No images are attached to the encounter.  MM 3D SCREEN BREAST BILATERAL  Result Date: 05/21/2022 CLINICAL DATA:  Screening. EXAM: DIGITAL SCREENING BILATERAL MAMMOGRAM WITH TOMOSYNTHESIS AND CAD TECHNIQUE: Bilateral screening digital craniocaudal and mediolateral oblique mammograms were obtained. Bilateral screening digital breast tomosynthesis was performed. The images were evaluated with computer-aided detection. COMPARISON:  Previous exam(s).  ACR Breast Density Category b: There are scattered areas of fibroglandular density. FINDINGS: There are no findings suspicious for malignancy. IMPRESSION: No mammographic evidence of malignancy. A result letter of this screening mammogram will be mailed directly to the patient. RECOMMENDATION: Screening mammogram in one year. (Code:SM-B-01Y) BI-RADS CATEGORY  1: Negative. Electronically Signed   By: Ammie Ferrier M.D.   On: 05/21/2022 06:52     Assessment and plan- Patient is a 76 y.o. 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  She is s/p 6 cycles of CarboTaxol chemotherapy adjuvantly which she  completed in June 2021.   Clinically patient is in signs and symptoms of recurrence based on today's exam.  Her CA125 remains within normal limits.  I will see her back in 6 months with CBC with differential CMP and CA125.  History of iron deficiency anemia: Patient is not presently anemic with normal iron studies and ferritin levels are normal at 45.  She does not require any IV iron at this time.  We will repeat levels again in 6 months time   Visit Diagnosis 1. Iron deficiency anemia, unspecified iron deficiency anemia type   2. Encounter for follow-up surveillance of ovarian cancer      Dr. Randa Evens, MD, MPH [facility] at [facility] [phone] 05/27/2022 7:35 PM

## 2022-06-06 ENCOUNTER — Inpatient Hospital Stay: Payer: Medicare Other

## 2022-06-07 ENCOUNTER — Inpatient Hospital Stay: Payer: Medicare Other | Attending: Oncology | Admitting: Licensed Clinical Social Worker

## 2022-06-07 DIAGNOSIS — C561 Malignant neoplasm of right ovary: Secondary | ICD-10-CM

## 2022-06-07 NOTE — Progress Notes (Signed)
[facility] CSW Counseling Note  Patient was referred by medical provider. Treatment type: Individual  Presenting Concerns: Patient and/or family reports the following symptoms/concerns: anxiety, depression, and stress Duration of problem: 1.5 years; Severity of problem: mild   Orientation:oriented to person, place, time/date, situation, day of week, month of year, and year.   Affect: Appropriate Risk of harm to self or others: No plan to harm self or others  Patient and/or Family's Strengths/Protective Factors: Social connections, Social and Emotional competence, Concrete supports in place (healthy food, safe environments, etc.), Sense of purpose, and Physical Health (exercise, healthy diet, medication compliance, etc.)Ability for insight  Active sense of humor  Average or above average intelligence  Capable of independent living  Engineer, drilling fund of knowledge  Motivation for treatment/growth  Physical Health  Religious Affiliation  Special hobby/interest  Supportive family/friends  Work skills      Goals Addressed: Patient will:  Reduce symptoms of: anxiety, depression, and stress Increase knowledge and/or ability of: coping skills, healthy habits, and stress reduction  Increase healthy adjustment to current life circumstances, Increase adequate support systems for patient/family, and Begin healthy grieving over loss   Progress towards Goals: Progressing   Interventions: Interventions utilized:  CBT, Strength-based, Supportive, Family Systems, and Reframing      Assessment: Patient currently experiencing  adjustment concerns post-treatment.  Patient is feeling better, after her medication was changed last week. Patient states her anxiety remains the same and is struggling with the idea the anxiety will remain the same. Discussed different ways to manage anxiety about new experiences, but stated she continues to feel she is more flexible and  improving overall.  CSW discussed different skills to manage stressors and triggers. Patient reports she is starting to work on her crafts again for the first time in four years.  Patient stated she feels very happy about this.      Plan: Follow up with CSW: 2 weeks 06/22/2022 @ 1:30 PM Behavioral recommendations: CBT, ACT, continue using skills  Referral(s): Support group(s):  N/A         Carrie Amas, LCSW

## 2022-06-09 ENCOUNTER — Other Ambulatory Visit: Payer: Self-pay | Admitting: Psychiatry

## 2022-06-20 DIAGNOSIS — L508 Other urticaria: Secondary | ICD-10-CM | POA: Diagnosis not present

## 2022-06-21 NOTE — Progress Notes (Signed)
BH MD/PA/NP OP Progress Note  06/25/2022 1:51 PM Carrie Roberson  MRN:  [id number]  Chief Complaint:  Chief Complaint  Patient presents with   Follow-up   HPI:  This is a follow-up appointment for depression and anxiety.  She states that she has been doing well except that it has been a little difficult this time of the year.  She reports a loss of her family members and the season few years ago.  She also talks about conflict among her sisters.  She talks about her sister in [place], who was upset of their family not included in the memory book Chrys Racer (her other sister) created. She states that this sister also made a comment to let in their mother to live together as long as the other sister takes care of their mother.  Although she has come to grips with it, she feels that this recent event put a damper on.  She thinks her mood has been good otherwise despite lowering the dose of venlafaxine.  She has not had any imbalance issues anymore.  She sleeps fair.  She has been able to focus better.  She denies change in appetite.  She denies SI.  She denies panic attacks.  She took lorazepam last month for anxiety.  She denies alcohol use or drug use. She feels comfortable to stay on the current medication regimen.     Wt Readings from Last 3 Encounters:  06/25/22 174 lb 12.8 oz (79.3 kg)  05/18/22 173 lb (78.5 kg)  05/04/22 173 lb (78.5 kg)     Visit Diagnosis:    ICD-10-CM   1. Generalized anxiety disorder  F41.1     2. MDD (major depressive disorder), recurrent, in partial remission ([place])  F33.41       Past Psychiatric History: Please see initial evaluation for full details. I have reviewed the history. No updates at this time.     Past Medical History:  Past Medical History:  Diagnosis Date   Anxiety    Arthritis    Diverticulitis    Family history of breast cancer    Family history of leukemia    GERD (gastroesophageal reflux disease)    History of ischemic colitis  09/02/2017   Hx of colonic polyp    Hypertension    H/O-PT TAKEN OFF BY PCP AS OF 2019   IBS (irritable bowel syndrome)    Ovarian cancer ([place])    Ovarian cyst    Personal history of chemotherapy 2021   6 treatments   Vaginal atrophy     Past Surgical History:  Procedure Laterality Date   ABDOMINAL HYSTERECTOMY     BREAST BIOPSY Left    needle bx/clip-neg   BREAST BIOPSY Right 06/28/2021   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   CERVICAL BIOPSY  W/ LOOP ELECTRODE EXCISION     CHOLECYSTECTOMY     Colonoscopoy  2007, 2008, 2011   COLONOSCOPY WITH PROPOFOL N/A 05/05/2018   Procedure: COLONOSCOPY WITH PROPOFOL;  Surgeon: Lin Landsman, MD;  Location: [facility];  Service: Gastroenterology;  Laterality: N/A;   FLEXIBLE SIGMOIDOSCOPY N/A 07/20/2021   Procedure: FLEXIBLE SIGMOIDOSCOPY;  Surgeon: Lesly Rubenstein, MD;  Location: [facility];  Service: Endoscopy;  Laterality: N/A;   HYSTERECTOMY ABDOMINAL WITH SALPINGO-OOPHORECTOMY N/A 08/19/2019   Procedure: HYSTERECTOMY ABDOMINAL WITH SALPINGO-OOPHORECTOMY WITH PELVIC WASHINGS PERITONEAL BIOPSIES AND PARA AORTIC LYMPH NODE DISSECTION;  Surgeon: [name], MD;  Location: [facility];  Service: Gynecology;  Laterality: N/A;  LEEP     OMENTECTOMY N/A 08/19/2019   Procedure: OMENTECTOMY;  Surgeon: [name], MD;  Location: [facility];  Service: Gynecology;  Laterality: N/A;   OOPHORECTOMY     PORTA CATH INSERTION N/A 09/11/2019   Procedure: PORTA CATH INSERTION;  Surgeon: Algernon Huxley, MD;  Location: [facility];  Service: Cardiovascular;  Laterality: N/A;   TUBAL LIGATION  1999    Family Psychiatric History: Please see initial evaluation for full details. I have reviewed the history. No updates at this time.     Family History:  Family History  Problem Relation Age of Onset   Heart failure Mother    Atrial fibrillation Mother    Hypertension Mother    Diabetes Father    Heart attack  Father    Breast cancer Maternal Aunt 22   Breast cancer Maternal Grandmother 39   Breast cancer Cousin        2 mat cousins   Hypertension Sister    Diabetes Sister    Blindness Sister    Hypertension Sister    Arthritis Sister    Leukemia Paternal Aunt    Leukemia Paternal Uncle    Brain cancer Paternal Aunt    Other Brother 58       Drowning accident   Liver cancer Maternal Uncle    Alcohol abuse Maternal Uncle    Prostate cancer Neg Hx    Kidney cancer Neg Hx    BRCA 1/2 Neg Hx     Social History:  Social History   Socioeconomic History   Marital status: Single    Spouse name: Not on file   Number of children: 0   Years of education: Not on file   Highest education level: Associate degree: academic program  Occupational History   Occupation: Retired  Tobacco Use   Smoking status: Never   Smokeless tobacco: Never   Tobacco comments:    2bd hand smoke from father  Vaping Use   Vaping Use: Never used  Substance and Sexual Activity   Alcohol use: Not Currently    Comment: rarely   Drug use: Not on file   Sexual activity: Not on file  Other Topics Concern   Not on file  Social History Narrative   Not on file   Social Determinants of Health   Financial Resource Strain: Low Risk  (04/26/2022)   Overall Financial Resource Strain (CARDIA)    Difficulty of Paying Living Expenses: Not hard at all  Food Insecurity: No Food Insecurity (04/26/2022)   Hunger Vital Sign    Worried About Running Out of Food in the Last Year: Never true    [place] in the Last Year: Never true  Transportation Needs: No Transportation Needs (04/26/2022)   PRAPARE - Hydrologist (Medical): No    Lack of Transportation (Non-Medical): No  Physical Activity: Inactive (04/26/2022)   Exercise Vital Sign    Days of Exercise per Week: 0 days    Minutes of Exercise per Session: 0 min  Stress: Stress Concern Present (04/26/2022)   [place]    Feeling of Stress : To some extent  Social Connections: Socially Isolated (04/26/2022)   Social Connection and Isolation Panel [NHANES]    Frequency of Communication with Friends and Family: More than three times a week    Frequency of Social Gatherings with Friends and Family: Once a week  Attends Religious Services: Never    Active Member of Clubs or Organizations: No    Attends Archivist Meetings: Never    Marital Status: Never married    Allergies:  Allergies  Allergen Reactions   Citalopram Nausea And Vomiting   Ivp Dye [Iodinated Contrast Media] Hives and Swelling    hives   Meloxicam     unknown   Adhesive [Tape] Rash   Amlodipine Rash   Ampicillin Rash    Did it involve swelling of the face/tongue/throat, SOB, or low BP? No Did it involve sudden or severe rash/hives, skin peeling, or any reaction on the inside of your mouth or nose? Yes Did you need to seek medical attention at a hospital or doctor's office? Yes When did it last happen?       10 + years If all above answers are "NO", may proceed with cephalosporin use.   Cefoxitin Rash   Chlorhexidine Rash    Chloraprep Scrub Teal   Estradiol Rash   Estropipate Rash   Olmesartan Medoxomil-Hctz Rash   Sertraline Rash        Sulfa Antibiotics Rash    Metabolic Disorder Labs: No results found for: "HGBA1C", "MPG" No results found for: "PROLACTIN" Lab Results  Component Value Date   CHOL 188 09/02/2017   TRIG 131 09/02/2017   HDL 53 09/02/2017   CHOLHDL 3.5 09/02/2017   VLDL 26 09/02/2017   LDLCALC 109 (H) 09/02/2017   Lab Results  Component Value Date   TSH 1.535 07/19/2021   TSH 0.856 09/02/2017    Therapeutic Level Labs: No results found for: "LITHIUM" No results found for: "VALPROATE" No results found for: "CBMZ"  Current Medications: Current Outpatient Medications  Medication Sig Dispense Refill   acetaminophen (TYLENOL)  500 MG tablet Take 1,000 mg by mouth every 6 (six) hours as needed (for pain.).     hydrOXYzine (ATARAX) 25 MG tablet Take 25 mg by mouth daily.     lisinopril (ZESTRIL) 5 MG tablet TAKE 1 TABLET (5 MG TOTAL) BY MOUTH DAILY. 90 tablet 1   loratadine (CLARITIN) 10 MG tablet Take 10 mg by mouth daily. otc     LORazepam (ATIVAN) 0.5 MG tablet Take 0.5 mg by mouth daily as needed for anxiety.     mirabegron ER (MYRBETRIQ) 50 MG TB24 tablet Take 1 tablet (50 mg total) by mouth daily. 30 tablet 11   nitrofurantoin, macrocrystal-monohydrate, (MACROBID) 100 MG capsule TAKE 1 CAPSULE BY MOUTH EVERY DAY 30 capsule 2   omeprazole (PRILOSEC) 10 MG capsule Take 10 mg by mouth daily.     Polyethylene Glycol 3350 (MIRALAX PO) Take by mouth.     predniSONE (DELTASONE) 10 MG tablet Take 1 tablet (10 mg total) by mouth daily with breakfast. 30 tablet 0   pregabalin (LYRICA) 75 MG capsule Take 1 capsule (75 mg total) by mouth 2 (two) times daily. 60 capsule 3   Probiotic Product (UP4 PROBIOTICS WOMENS PO) Take 1 tablet by mouth daily.     triamcinolone (KENALOG) 0.025 % cream Apply topically 2 (two) times daily.     venlafaxine XR (EFFEXOR XR) 37.5 MG 24 hr capsule Take 1 capsule (37.5 mg total) by mouth daily with breakfast. 90 capsule 0   XYZAL ALLERGY 24HR 5 MG tablet Take 10 mg by mouth daily.     No current facility-administered medications for this visit.   Facility-Administered Medications Ordered in Other Visits  Medication Dose Route Frequency Provider Last Rate Last Admin  sodium chloride flush (NS) 0.9 % injection 10 mL  10 mL Intravenous PRN Sindy Guadeloupe, MD   10 mL at 01/05/20 0750     Musculoskeletal: Strength & Muscle Tone:  normal Gait & Station: normal Patient leans: N/A  Psychiatric Specialty Exam: Review of Systems  Psychiatric/Behavioral:  Negative for agitation, behavioral problems, confusion, decreased concentration, dysphoric mood, hallucinations, self-injury, sleep disturbance  and suicidal ideas. The patient is nervous/anxious. The patient is not hyperactive.   All other systems reviewed and are negative.   Blood pressure (!) 145/78, pulse 71, temperature 98.4 F (36.9 C), temperature source Oral, height _0  (1.575 m), weight 174 lb 12.8 oz (79.3 kg).Body mass index is 31.97 kg/m.  General Appearance: Fairly Groomed  Eye Contact:  Good  Speech:  Clear and Coherent  Volume:  Normal  Mood:   good  Affect:  Appropriate, Congruent, and euthymic  Thought Process:  Coherent  Orientation:  Full (Time, Place, and Person)  Thought Content: Logical   Suicidal Thoughts:  No  Homicidal Thoughts:  No  Memory:  Immediate;   Good  Judgement:  Good  Insight:  Good  Psychomotor Activity:  Normal  Concentration:  Concentration: Good and Attention Span: Good  Recall:  Good  Fund of Knowledge: Good  Language: Good  Akathisia:  No  Handed:  Right  AIMS (if indicated): not done  Assets:  Communication Skills Desire for Improvement  ADL's:  Intact  Cognition: WNL  Sleep:  Fair   Screenings: GAD-7    Flowsheet [place] Office Visit from 06/25/2022 in [place] Office Visit from 05/04/2022 in [place] [facility] at [place] Visit from 03/05/2022 in [place] Office Visit from 10/27/2021 in [place] and [facility] at [place] Visit from 03/30/2021 in [place] and [facility] at [facility]  Total GAD-7 Score 1 0 0 0 2      [place] Visit from 06/25/2022 in [place] Office Visit from 05/04/2022 in [place] [facility] at [place] from 04/26/2022 in [place] at [place] Visit from 03/05/2022 in [place] from 01/08/2022 in [place]  PHQ-2 Total Score 0 0 2 0 0  PHQ-9 Total Score -- 0 5 -- 3      [place] Visit from 06/25/2022 in [place] Office Visit from 01/08/2022 in [place] ED from 12/07/2021 in [facility] at [place] No Risk No Risk No Risk        Assessment and Plan:  Doniqua Saxby is a 76 y.o. 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   1. Generalized anxiety disorder 2. MDD (major depressive disorder), recurrent, in partial remission ([place]) Although she reports occasional anxiety in the context of conflict with her family, she denies any significant depression or anxiety despite lowering down venlafaxine due to concern of adverse reaction of imbalance.  Psychosocial stressors includes history of ovarian cancer, loss of her mother, and grief of loss of her brother, who passed away at age 87.  Will continue current dose of venlafaxine to  target depression and anxiety.  Will continue lorazepam as needed for anxiety.  She will continue to see a therapist.    Plan Continue venlafaxine 37.5 mg daily (imbalance issues from 75 mg daily) Continue lorazepam 0.5 mg daily as needed for anxiety (she rarely takes this. She declined refill this time) Next appointment: 2/5 at 4 PM for 30 mins, in person (Try melatonin 3 mg at night, 2 hours before going to the bed) - on probiotic   The patient demonstrates the following risk factors for suicide: Chronic risk factors for suicide include: psychiatric disorder of depression, anxiety . Acute risk factors for suicide include: unemployment and loss (financial, interpersonal, professional). Protective factors for this patient include: positive social support, coping skills, and hope for the  future. Considering these factors, the overall suicide risk at this point appears to be low. Patient is appropriate for outpatient follow up.             Collaboration of Care: Collaboration of Care: Other reviewed notes in Epic  Patient/Guardian was advised Release of Information must be obtained prior to any record release in order to collaborate their care with an outside provider. Patient/Guardian was advised if they have not already done so to contact the registration department to sign all necessary forms in order for [place] to release information regarding their care.   Consent: Patient/Guardian gives verbal consent for treatment and assignment of benefits for services provided during this visit. Patient/Guardian expressed understanding and agreed to proceed.    Norman Clay, MD 06/25/2022, 1:51 PM

## 2022-06-22 ENCOUNTER — Inpatient Hospital Stay: Payer: Medicare Other | Attending: Oncology | Admitting: Licensed Clinical Social Worker

## 2022-06-22 DIAGNOSIS — C561 Malignant neoplasm of right ovary: Secondary | ICD-10-CM

## 2022-06-22 NOTE — Progress Notes (Signed)
[facility] CSW Counseling Note  Patient was referred by medical provider. Treatment type: Individual  Presenting Concerns: Patient and/or family reports the following symptoms/concerns: anxiety, depression, and stress Duration of problem: 1.5 years; Severity of problem: mild   Orientation:oriented to person, place, time/date, situation, day of week, month of year, and year.   Affect: Appropriate Risk of harm to self or others: No plan to harm self or others  Patient and/or Family's Strengths/Protective Factors: Social connections, Social and Emotional competence, Concrete supports in place (healthy food, safe environments, etc.), Sense of purpose, Physical Health (exercise, healthy diet, medication compliance, etc.), and Caregiver has knowledge of parenting & child developmentAbility for insight  Active sense of humor  Average or above average intelligence  Capable of independent living  Engineer, drilling fund of knowledge  Motivation for treatment/growth  Physical Health  Religious Affiliation  Special hobby/interest  Supportive family/friends  Work skills      Goals Addressed: Patient will:  Reduce symptoms of: anxiety, depression, and stress Increase knowledge and/or ability of: coping skills, healthy habits, self-management skills, stress reduction, and communication and boundary setting   Increase healthy adjustment to current life circumstances, Increase adequate support systems for patient/family, and Begin healthy grieving over loss   Progress towards Goals: Progressing   Interventions: Interventions utilized:  CBT, Solution Focused, Strength-based, Supportive, Family Systems, Reframing, and Other: ACT       Assessment: Patient currently experiencing adjustment concerns post-treatment.  Patient continues to feel better since medication change.Patient states her anxiety remains the same and is struggling with the idea the anxiety will remain the  same. Discussed different ways to manage anxiety about new experiences, but stated she continues to feel she is more flexible and improving overall. Patient related a familial complication which is causing anxiety. CSW discussed different skills to manage stressors and triggers to reduce anxiety, including communication and boundary setting.  .      Plan: Follow up with CSW: 07/06/2022 1:30 PM Behavioral recommendations: continue using skills   Referral(s): Support group(s):  N/A         Adelene Amas, LCSW

## 2022-06-25 ENCOUNTER — Ambulatory Visit (INDEPENDENT_AMBULATORY_CARE_PROVIDER_SITE_OTHER): Payer: Medicare Other | Admitting: Psychiatry

## 2022-06-25 ENCOUNTER — Encounter: Payer: Self-pay | Admitting: Psychiatry

## 2022-06-25 VITALS — BP 145/78 | HR 71 | Temp 98.4°F | Ht 62.0 in | Wt 174.8 lb

## 2022-06-25 DIAGNOSIS — F3341 Major depressive disorder, recurrent, in partial remission: Secondary | ICD-10-CM

## 2022-06-25 DIAGNOSIS — F411 Generalized anxiety disorder: Secondary | ICD-10-CM

## 2022-06-25 NOTE — Patient Instructions (Signed)
Continue venlafaxine 37.5 mg daily  Continue lorazepam 0.5 mg daily as needed for anxiety Next appointment: 2/5 at 4 PM

## 2022-07-04 ENCOUNTER — Ambulatory Visit (INDEPENDENT_AMBULATORY_CARE_PROVIDER_SITE_OTHER): Payer: Medicare Other | Admitting: Physician Assistant

## 2022-07-04 ENCOUNTER — Encounter: Payer: Self-pay | Admitting: Physician Assistant

## 2022-07-04 VITALS — BP 137/84 | HR 74 | Ht 63.0 in | Wt 172.0 lb

## 2022-07-04 DIAGNOSIS — N390 Urinary tract infection, site not specified: Secondary | ICD-10-CM

## 2022-07-04 DIAGNOSIS — N3281 Overactive bladder: Secondary | ICD-10-CM | POA: Diagnosis not present

## 2022-07-04 LAB — BLADDER SCAN AMB NON-IMAGING

## 2022-07-04 NOTE — Progress Notes (Signed)
07/04/2022 10:05 AM   Carrie Roberson 12-06-1945 [id number]  CC: Chief Complaint  Patient presents with   Follow-up   Over Active Bladder   HPI: Carrie Roberson is a 76 y.o. 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 difficile colitis, OAB wet with mixed incontinence on Myrbetriq 50 mg daily, and recurrent UTI on suppressive Macrobid who presents today for annual follow-up.   Today she reports she is doing extremely well from the urinary perspective on suppressive Macrobid.  She has been on it about 6 months and has had no breakthrough infections during that time.  She feels it is making a large difference in her overall symptoms.  She is open to stopping it to see if she has recurrent infections, but would like to defer this until she gets to the holidays.  No acute concerns today.  PVR 0 mL.  PMH: Past Medical History:  Diagnosis Date   Anxiety    Arthritis    Diverticulitis    Family history of breast cancer    Family history of leukemia    GERD (gastroesophageal reflux disease)    History of ischemic colitis 09/02/2017   Hx of colonic polyp    Hypertension    H/O-PT TAKEN OFF BY PCP AS OF 2019   IBS (irritable bowel syndrome)    Ovarian cancer ([place])    Ovarian cyst    Personal history of chemotherapy 2021   6 treatments   Vaginal atrophy     Surgical History: Past Surgical History:  Procedure Laterality Date   ABDOMINAL HYSTERECTOMY     BREAST BIOPSY Left    needle bx/clip-neg   BREAST BIOPSY Right 06/28/2021   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   CERVICAL BIOPSY  W/ LOOP ELECTRODE EXCISION     CHOLECYSTECTOMY     Colonoscopoy  2007, 2008, 2011   COLONOSCOPY WITH PROPOFOL N/A 05/05/2018   Procedure: COLONOSCOPY WITH PROPOFOL;  Surgeon: Lin Landsman, MD;  Location: [facility];  Service: Gastroenterology;  Laterality: N/A;   FLEXIBLE SIGMOIDOSCOPY  N/A 07/20/2021   Procedure: FLEXIBLE SIGMOIDOSCOPY;  Surgeon: Lesly Rubenstein, MD;  Location: [facility];  Service: Endoscopy;  Laterality: N/A;   HYSTERECTOMY ABDOMINAL WITH SALPINGO-OOPHORECTOMY N/A 08/19/2019   Procedure: HYSTERECTOMY ABDOMINAL WITH SALPINGO-OOPHORECTOMY WITH PELVIC WASHINGS PERITONEAL BIOPSIES AND PARA AORTIC LYMPH NODE DISSECTION;  Surgeon: [name], MD;  Location: [facility];  Service: Gynecology;  Laterality: N/A;   LEEP     OMENTECTOMY N/A 08/19/2019   Procedure: OMENTECTOMY;  Surgeon: [name], MD;  Location: [facility];  Service: Gynecology;  Laterality: N/A;   OOPHORECTOMY     PORTA CATH INSERTION N/A 09/11/2019   Procedure: PORTA CATH INSERTION;  Surgeon: Algernon Huxley, MD;  Location: [place] [facility];  Service: Cardiovascular;  Laterality: N/A;   TUBAL LIGATION  1999    Home Medications:  Allergies as of 07/04/2022       Reactions   Citalopram Nausea And Vomiting   Ivp Dye [iodinated Contrast Media] Hives, Swelling   hives   Meloxicam    unknown   Adhesive [tape] Rash   Amlodipine Rash   Ampicillin Rash   Did it involve swelling of the face/tongue/throat, SOB, or low BP? No Did it involve sudden or severe rash/hives, skin peeling, or any reaction on the inside of your mouth or nose? Yes Did you need to seek medical attention at a hospital  or doctor's office? Yes When did it last happen?       10 + years If all above answers are "NO", may proceed with cephalosporin use.   Cefoxitin Rash   Chlorhexidine Rash   Chloraprep Scrub Teal   Estradiol Rash   Estropipate Rash   Olmesartan Medoxomil-hctz Rash   Sertraline Rash      Sulfa Antibiotics Rash        Medication List        Accurate as of July 04, 2022 10:05 AM. If you have any questions, ask your nurse or doctor.          acetaminophen 500 MG tablet Commonly known as: TYLENOL Take 1,000 mg by mouth every 6 (six) hours as needed (for pain.).    hydrOXYzine 25 MG tablet Commonly known as: ATARAX Take 25 mg by mouth daily.   lisinopril 5 MG tablet Commonly known as: ZESTRIL TAKE 1 TABLET (5 MG TOTAL) BY MOUTH DAILY.   loratadine 10 MG tablet Commonly known as: CLARITIN Take 10 mg by mouth daily. otc   LORazepam 0.5 MG tablet Commonly known as: ATIVAN Take 0.5 mg by mouth daily as needed for anxiety.   mirabegron ER 50 MG Tb24 tablet Commonly known as: Myrbetriq Take 1 tablet (50 mg total) by mouth daily.   MIRALAX PO Take by mouth.   nitrofurantoin (macrocrystal-monohydrate) 100 MG capsule Commonly known as: MACROBID TAKE 1 CAPSULE BY MOUTH EVERY DAY   omeprazole 10 MG capsule Commonly known as: PRILOSEC Take 10 mg by mouth daily.   predniSONE 10 MG tablet Commonly known as: DELTASONE Take 1 tablet (10 mg total) by mouth daily with breakfast.   pregabalin 75 MG capsule Commonly known as: Lyrica Take 1 capsule (75 mg total) by mouth 2 (two) times daily.   triamcinolone 0.025 % cream Commonly known as: KENALOG Apply topically 2 (two) times daily.   UP4 PROBIOTICS WOMENS PO Take 1 tablet by mouth daily.   venlafaxine XR 37.5 MG 24 hr capsule Commonly known as: Effexor XR Take 1 capsule (37.5 mg total) by mouth daily with breakfast.   Xyzal Allergy 24HR 5 MG tablet Generic drug: levocetirizine Take 10 mg by mouth daily.        Allergies:  Allergies  Allergen Reactions   Citalopram Nausea And Vomiting   Ivp Dye [Iodinated Contrast Media] Hives and Swelling    hives   Meloxicam     unknown   Adhesive [Tape] Rash   Amlodipine Rash   Ampicillin Rash    Did it involve swelling of the face/tongue/throat, SOB, or low BP? No Did it involve sudden or severe rash/hives, skin peeling, or any reaction on the inside of your mouth or nose? Yes Did you need to seek medical attention at a hospital or doctor's office? Yes When did it last happen?       10 + years If all above answers are "NO", may proceed  with cephalosporin use.   Cefoxitin Rash   Chlorhexidine Rash    Chloraprep Scrub Teal   Estradiol Rash   Estropipate Rash   Olmesartan Medoxomil-Hctz Rash   Sertraline Rash        Sulfa Antibiotics Rash    Family History: Family History  Problem Relation Age of Onset   Heart failure Mother    Atrial fibrillation Mother    Hypertension Mother    Diabetes Father    Heart attack Father    Breast cancer Maternal Aunt 70  Breast cancer Maternal Grandmother 63   Breast cancer Cousin        2 mat cousins   Hypertension Sister    Diabetes Sister    Blindness Sister    Hypertension Sister    Arthritis Sister    Leukemia Paternal Aunt    Leukemia Paternal Uncle    Brain cancer Paternal Aunt    Other Brother 71       Drowning accident   Liver cancer Maternal Uncle    Alcohol abuse Maternal Uncle    Prostate cancer Neg Hx    Kidney cancer Neg Hx    BRCA 1/2 Neg Hx     Social History:   reports that she has never smoked. She has never been exposed to tobacco smoke. She has never used smokeless tobacco. She reports that she does not drink alcohol and does not use drugs.  Physical Exam: BP 137/84   Pulse 74   Ht _0  (1.6 m)   Wt 172 lb (78 kg)   BMI 30.47 kg/m   Constitutional:  Alert and oriented, no acute distress, nontoxic appearing HEENT: [place], AT Cardiovascular: No clubbing, cyanosis, or edema Respiratory: Normal respiratory effort, no increased work of breathing Skin: No rashes, bruises or suspicious lesions Neurologic: Grossly intact, no focal deficits, moving all 4 extremities Psychiatric: Normal mood and affect  Laboratory Data: Results for orders placed or performed in visit on 07/04/22  BLADDER SCAN AMB NON-IMAGING  Result Value Ref Range   Scan Result 68m    Assessment & Plan:   1. OAB (overactive bladder) Managed on Myrbetriq 50 mg daily, will continue this.  She is emptying appropriately. - BLADDER SCAN AMB NON-IMAGING  2. Recurrent  UTI Well-managed on daily suppressive Macrobid.  Okay to continue for now.  We discussed stopping therapy per her preference in the coming months.  I explained that I would ideally like her to be on no longer than 1 year of continuous suppression and we discussed the risks of long-term Macrobid including pulmonary fibrosis and liver damage.  Will plan to see her back in a year, sooner as needed.  Return in 1 year (on 07/05/2023) for Annual OAB f/u with P[facility].  SDebroah Loop PA-C  [facility] [address] [place] [phone]

## 2022-07-13 ENCOUNTER — Other Ambulatory Visit: Payer: Self-pay | Admitting: Physician Assistant

## 2022-07-13 ENCOUNTER — Other Ambulatory Visit: Payer: Self-pay | Admitting: Family Medicine

## 2022-07-13 DIAGNOSIS — I10 Essential (primary) hypertension: Secondary | ICD-10-CM

## 2022-07-13 DIAGNOSIS — N390 Urinary tract infection, site not specified: Secondary | ICD-10-CM

## 2022-07-13 NOTE — Telephone Encounter (Signed)
Requested Prescriptions  Pending Prescriptions Disp Refills   lisinopril (ZESTRIL) 5 MG tablet [Pharmacy Med Name: LISINOPRIL 5 MG TABLET] 90 tablet 1    Sig: TAKE 1 TABLET (5 MG TOTAL) BY MOUTH DAILY.     Cardiovascular:  ACE Inhibitors Passed - 07/13/2022  2:26 AM      Passed - Cr in normal range and within 180 days    Creatinine, Ser  Date Value Ref Range Status  05/18/2022 0.70 0.44 - 1.00 mg/dL Final         Passed - K in normal range and within 180 days    Potassium  Date Value Ref Range Status  05/18/2022 4.1 3.5 - 5.1 mmol/L Final         Passed - Patient is not pregnant      Passed - Last BP in normal range    BP Readings from Last 1 Encounters:  07/04/22 137/84         Passed - Valid encounter within last 6 months    Recent Outpatient Visits           2 months ago Chronic idiopathic urticaria   [facility] [place], Deanna C, MD   8 months ago Cystitis   [facility] [place], [place], MD   11 months ago Hospital discharge follow-up   [facility] [place], Deanna C, MD   1 year ago Essential hypertension   [facility] [place], Deanna C, MD   1 year ago [facility] [place], [place], MD       Future Appointments             In 11 months Luana Shu [facility]

## 2022-07-26 ENCOUNTER — Telehealth (INDEPENDENT_AMBULATORY_CARE_PROVIDER_SITE_OTHER): Payer: Self-pay

## 2022-07-26 ENCOUNTER — Inpatient Hospital Stay: Payer: Medicare Other | Attending: Oncology | Admitting: Licensed Clinical Social Worker

## 2022-07-26 DIAGNOSIS — Z803 Family history of malignant neoplasm of breast: Secondary | ICD-10-CM | POA: Insufficient documentation

## 2022-07-26 DIAGNOSIS — Z8543 Personal history of malignant neoplasm of ovary: Secondary | ICD-10-CM | POA: Insufficient documentation

## 2022-07-26 DIAGNOSIS — Z9221 Personal history of antineoplastic chemotherapy: Secondary | ICD-10-CM | POA: Insufficient documentation

## 2022-07-26 DIAGNOSIS — Z9079 Acquired absence of other genital organ(s): Secondary | ICD-10-CM | POA: Insufficient documentation

## 2022-07-26 DIAGNOSIS — Z800 Family history of malignant neoplasm of digestive organs: Secondary | ICD-10-CM | POA: Insufficient documentation

## 2022-07-26 DIAGNOSIS — R918 Other nonspecific abnormal finding of lung field: Secondary | ICD-10-CM | POA: Insufficient documentation

## 2022-07-26 DIAGNOSIS — Z90722 Acquired absence of ovaries, bilateral: Secondary | ICD-10-CM | POA: Insufficient documentation

## 2022-07-26 DIAGNOSIS — K625 Hemorrhage of anus and rectum: Secondary | ICD-10-CM | POA: Insufficient documentation

## 2022-07-26 DIAGNOSIS — Z08 Encounter for follow-up examination after completed treatment for malignant neoplasm: Secondary | ICD-10-CM | POA: Insufficient documentation

## 2022-07-26 DIAGNOSIS — D509 Iron deficiency anemia, unspecified: Secondary | ICD-10-CM | POA: Insufficient documentation

## 2022-07-26 DIAGNOSIS — Z90710 Acquired absence of both cervix and uterus: Secondary | ICD-10-CM | POA: Insufficient documentation

## 2022-07-26 DIAGNOSIS — C561 Malignant neoplasm of right ovary: Secondary | ICD-10-CM

## 2022-07-26 DIAGNOSIS — I10 Essential (primary) hypertension: Secondary | ICD-10-CM | POA: Insufficient documentation

## 2022-07-26 DIAGNOSIS — Z806 Family history of leukemia: Secondary | ICD-10-CM | POA: Insufficient documentation

## 2022-07-26 NOTE — Progress Notes (Signed)
[facility] CSW Counseling Note  Patient was referred by medical provider. Treatment type: Individual  Presenting Concerns: Patient and/or family reports the following symptoms/concerns: depression, stress, and adjustment to diagnosis and treatment Duration of problem: 1.5 years; Severity of problem: moderate   Orientation:oriented to person, place, time/date, situation, day of week, month of year, and year.   Affect: Depressed Risk of harm to self or others: No plan to harm self or others  Patient and/or Family's Strengths/Protective Factors: Social connections, Social and Emotional competence, Concrete supports in place (healthy food, safe environments, etc.), Sense of purpose, Physical Health (exercise, healthy diet, medication compliance, etc.), Caregiver has knowledge of parenting & child development, and Parental ResilienceAbility for insight  Active sense of humor  Average or above average intelligence  Capable of independent living  Engineer, drilling fund of knowledge  Motivation for treatment/growth  Physical Health  Religious Affiliation  Special hobby/interest  Supportive family/friends  Work skills      Goals Addressed: Patient will:  Reduce symptoms of: stress and adjustment concerns Increase knowledge and/or ability of: coping skills, healthy habits, stress reduction, and communications   Increase healthy adjustment to current life circumstances, Increase adequate support systems for patient/family, and mindfulness practice.   Progress towards Goals: Progressing   Interventions: Interventions utilized:  CBT, Solution Focused, Strength-based, Supportive, Family Systems, Reframing, and Other: ACT       Assessment: Patient currently experiencing adjustment concerns post-treatment.  Patient reports her younger sister passed away unexpectedly two days before Christmas.  Patient reports her nephew and her sister argued during the trip to visit  her sister's family and she was very upset about her nephews behavior.  Discussed ways to process her sisters death and her nephew's behavior. CSW discussed different skills to manage stressors and triggers to reduce anxiety, including communication and boundary setting. .      Plan: Follow up with CSW: 08/09/2022 @ 1:30 Behavioral recommendations: continue using skills  and communication Referral(s): Support group(s):  N/A         [employer], LCSW

## 2022-07-26 NOTE — Telephone Encounter (Signed)
Spoke with the patient and she is scheduled with Dr. Lucky Cowboy for a port removal on 08/02/22 with [facility] 7[facility]. Pre-procedure instructions were discussed and will be mailed.

## 2022-07-28 ENCOUNTER — Other Ambulatory Visit: Payer: Self-pay | Admitting: Psychiatry

## 2022-08-01 ENCOUNTER — Inpatient Hospital Stay: Payer: Medicare Other

## 2022-08-02 ENCOUNTER — Telehealth (INDEPENDENT_AMBULATORY_CARE_PROVIDER_SITE_OTHER): Payer: Self-pay

## 2022-08-02 ENCOUNTER — Other Ambulatory Visit: Payer: Self-pay | Admitting: Physician Assistant

## 2022-08-02 DIAGNOSIS — N3281 Overactive bladder: Secondary | ICD-10-CM

## 2022-08-02 DIAGNOSIS — C561 Malignant neoplasm of right ovary: Secondary | ICD-10-CM

## 2022-08-02 NOTE — Telephone Encounter (Signed)
Patient left a message late 08/01/22 stated she would not be able to keep her appt for her port removal on 08/02/22 and needed to be rescheduled. I contacted the patient and had to leave a message. I contacted again and spoke with the patient and she was offered 08/06/22 and patient declined stating she would like 08/16/22 instead. Patient port removal has been moved to 08/16/22 and pre-procedure instructions will be mailed.

## 2022-08-16 ENCOUNTER — Other Ambulatory Visit: Payer: Self-pay

## 2022-08-16 ENCOUNTER — Encounter
Admission: RE | Disposition: A | Discharge status: Home / Self Care | Payer: Self-pay | Source: Home / Self Care | Attending: Vascular Surgery

## 2022-08-16 ENCOUNTER — Encounter: Payer: Self-pay | Admitting: Vascular Surgery

## 2022-08-16 ENCOUNTER — Ambulatory Visit
Admission: RE | Admit: 2022-08-16 | Discharge: 2022-08-16 | Disposition: A | Discharge status: Home / Self Care | Payer: Medicare Other | Attending: Vascular Surgery | Admitting: Vascular Surgery

## 2022-08-16 DIAGNOSIS — I10 Essential (primary) hypertension: Secondary | ICD-10-CM | POA: Insufficient documentation

## 2022-08-16 DIAGNOSIS — Z8543 Personal history of malignant neoplasm of ovary: Secondary | ICD-10-CM

## 2022-08-16 DIAGNOSIS — Z452 Encounter for adjustment and management of vascular access device: Secondary | ICD-10-CM | POA: Insufficient documentation

## 2022-08-16 DIAGNOSIS — C561 Malignant neoplasm of right ovary: Secondary | ICD-10-CM

## 2022-08-16 DIAGNOSIS — Z95828 Presence of other vascular implants and grafts: Secondary | ICD-10-CM

## 2022-08-16 HISTORY — PX: PORTA CATH REMOVAL: CATH118286

## 2022-08-16 SURGERY — PORTA CATH REMOVAL
Anesthesia: Moderate Sedation

## 2022-08-16 MED ORDER — MIDAZOLAM HCL 2 MG/2ML IJ SOLN
INTRAMUSCULAR | Status: DC | PRN
  Administered 2022-08-16 (×2): 1 mg via INTRAVENOUS

## 2022-08-16 MED ORDER — MIDAZOLAM HCL 2 MG/ML PO SYRP: 8.0000 mg | ORAL_SOLUTION | Freq: Once | ORAL | Status: DC | PRN

## 2022-08-16 MED ORDER — VANCOMYCIN HCL IN DEXTROSE 1-5 GM/200ML-% IV SOLN: 1000.0000 mg | INTRAVENOUS | Status: DC

## 2022-08-16 MED ORDER — FAMOTIDINE 20 MG PO TABS: 40.0000 mg | ORAL_TABLET | Freq: Once | ORAL | Status: DC | PRN

## 2022-08-16 MED ORDER — VANCOMYCIN HCL IN DEXTROSE 1-5 GM/200ML-% IV SOLN
INTRAVENOUS | Status: AC
  Filled 2022-08-16: qty 200

## 2022-08-16 MED ORDER — HYDROMORPHONE HCL 1 MG/ML IJ SOLN: 1.0000 mg | Freq: Once | INTRAMUSCULAR | Status: DC | PRN

## 2022-08-16 MED ORDER — MIDAZOLAM HCL 5 MG/5ML IJ SOLN
INTRAMUSCULAR | Status: AC
  Filled 2022-08-16: qty 5

## 2022-08-16 MED ORDER — FENTANYL CITRATE (PF) 100 MCG/2ML IJ SOLN
INTRAMUSCULAR | Status: DC | PRN
  Administered 2022-08-16: 50 ug via INTRAVENOUS

## 2022-08-16 MED ORDER — ONDANSETRON HCL 4 MG/2ML IJ SOLN: 4.0000 mg | Freq: Four times a day (QID) | INTRAMUSCULAR | Status: DC | PRN

## 2022-08-16 MED ORDER — METHYLPREDNISOLONE SODIUM SUCC 125 MG IJ SOLR: 125.0000 mg | Freq: Once | INTRAMUSCULAR | Status: DC | PRN

## 2022-08-16 MED ORDER — DIPHENHYDRAMINE HCL 50 MG/ML IJ SOLN: 50.0000 mg | Freq: Once | INTRAMUSCULAR | Status: DC | PRN

## 2022-08-16 MED ORDER — SODIUM CHLORIDE 0.9 % IV SOLN: INTRAVENOUS | Status: DC

## 2022-08-16 MED ORDER — FENTANYL CITRATE (PF) 100 MCG/2ML IJ SOLN
INTRAMUSCULAR | Status: AC
  Filled 2022-08-16: qty 2

## 2022-08-16 SURGICAL SUPPLY — 5 items
DERMABOND ADVANCED .7 DNX12 (GAUZE/BANDAGES/DRESSINGS) IMPLANT
PACK ANGIOGRAPHY (CUSTOM PROCEDURE TRAY) IMPLANT
SUT MNCRL AB 4-0 PS2 18 (SUTURE) IMPLANT
SUT VIC AB 3-0 SH 27 (SUTURE) ×1
SUT VIC AB 3-0 SH 27X BRD (SUTURE) IMPLANT

## 2022-08-16 NOTE — H&P (Signed)
[facility] Admission History & Physical  MRN : [id number]  Carrie Roberson is a 77 y.o. (01/10/1946) female who presents with chief complaint of No chief complaint on file. Marland Kitchen  History of Present Illness: Patient with a history of ovarian cancer status post port placement almost 3 years ago.  No longer using her port and desires to have it removed.  No fevers or chills.  No new complaints today.  No current facility-administered medications for this encounter.   Facility-Administered Medications Ordered in Other Encounters  Medication Dose Route Frequency Provider Last Rate Last Admin   sodium chloride flush (NS) 0.9 % injection 10 mL  10 mL Intravenous PRN Sindy Guadeloupe, MD   10 mL at 01/05/20 0750    Past Medical History:  Diagnosis Date   Anxiety    Arthritis    Diverticulitis    Family history of breast cancer    Family history of leukemia    GERD (gastroesophageal reflux disease)    History of ischemic colitis 09/02/2017   Hx of colonic polyp    Hypertension    H/O-PT TAKEN OFF BY PCP AS OF 2019   IBS (irritable bowel syndrome)    Ovarian cancer ([place])    Ovarian cyst    Personal history of chemotherapy 2021   6 treatments   Vaginal atrophy     Past Surgical History:  Procedure Laterality Date   ABDOMINAL HYSTERECTOMY     BREAST BIOPSY Left    needle bx/clip-neg   BREAST BIOPSY Right 06/28/2021   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   CERVICAL BIOPSY  W/ LOOP ELECTRODE EXCISION     CHOLECYSTECTOMY     Colonoscopoy  2007, 2008, 2011   COLONOSCOPY WITH PROPOFOL N/A 05/05/2018   Procedure: COLONOSCOPY WITH PROPOFOL;  Surgeon: Lin Landsman, MD;  Location: [facility];  Service: Gastroenterology;  Laterality: N/A;   FLEXIBLE SIGMOIDOSCOPY N/A 07/20/2021   Procedure: FLEXIBLE SIGMOIDOSCOPY;  Surgeon: Lesly Rubenstein, MD;  Location: [facility];  Service: Endoscopy;  Laterality: N/A;   HYSTERECTOMY ABDOMINAL WITH  SALPINGO-OOPHORECTOMY N/A 08/19/2019   Procedure: HYSTERECTOMY ABDOMINAL WITH SALPINGO-OOPHORECTOMY WITH PELVIC WASHINGS PERITONEAL BIOPSIES AND PARA AORTIC LYMPH NODE DISSECTION;  Surgeon: [name], MD;  Location: [facility];  Service: Gynecology;  Laterality: N/A;   LEEP     OMENTECTOMY N/A 08/19/2019   Procedure: OMENTECTOMY;  Surgeon: [name], MD;  Location: [facility];  Service: Gynecology;  Laterality: N/A;   OOPHORECTOMY     PORTA CATH INSERTION N/A 09/11/2019   Procedure: PORTA CATH INSERTION;  Surgeon: Algernon Huxley, MD;  Location: [facility];  Service: Cardiovascular;  Laterality: N/A;   TUBAL LIGATION  1999     Social History   Tobacco Use   Smoking status: Never    Passive exposure: Never   Smokeless tobacco: Never   Tobacco comments:    2bd hand smoke from father  Vaping Use   Vaping Use: Never used  Substance Use Topics   Alcohol use: Never    Comment: rarely   Drug use: Never     Family History  Problem Relation Age of Onset   Heart failure Mother    Atrial fibrillation Mother    Hypertension Mother    Diabetes Father    Heart attack Father    Breast cancer Maternal Aunt 55   Breast cancer Maternal Grandmother 80   Breast cancer Cousin        2  mat cousins   Hypertension Sister    Diabetes Sister    Blindness Sister    Hypertension Sister    Arthritis Sister    Leukemia Paternal Aunt    Leukemia Paternal Uncle    Brain cancer Paternal Aunt    Other Brother 68       Drowning accident   Liver cancer Maternal Uncle    Alcohol abuse Maternal Uncle    Prostate cancer Neg Hx    Kidney cancer Neg Hx    BRCA 1/2 Neg Hx     Allergies  Allergen Reactions   Citalopram Nausea And Vomiting   Ivp Dye [Iodinated Contrast Media] Hives and Swelling    hives   Meloxicam     unknown   Adhesive [Tape] Rash   Amlodipine Rash   Ampicillin Rash    Did it involve swelling of the face/tongue/throat, SOB, or low BP? No Did it involve  sudden or severe rash/hives, skin peeling, or any reaction on the inside of your mouth or nose? Yes Did you need to seek medical attention at a hospital or doctor's office? Yes When did it last happen?       10 + years If all above answers are "NO", may proceed with cephalosporin use.   Cefoxitin Rash   Chlorhexidine Rash    Chloraprep Scrub Teal   Estradiol Rash   Estropipate Rash   Olmesartan Medoxomil-Hctz Rash   Sertraline Rash        Sulfa Antibiotics Rash     REVIEW OF SYSTEMS (Negative unless checked)  Constitutional: '[]'$ Weight loss  '[]'$ Fever  '[]'$ Chills Cardiac: '[]'$ Chest pain   '[]'$ Chest pressure   '[]'$ Palpitations   '[]'$ Shortness of breath when laying flat   '[]'$ Shortness of breath at rest   '[]'$ Shortness of breath with exertion. Vascular:  '[]'$ Pain in legs with walking   '[]'$ Pain in legs at rest   '[]'$ Pain in legs when laying flat   '[]'$ Claudication   '[]'$ Pain in feet when walking  '[]'$ Pain in feet at rest  '[]'$ Pain in feet when laying flat   '[]'$ History of DVT   '[]'$ Phlebitis   '[]'$ Swelling in legs   '[]'$ Varicose veins   '[]'$ Non-healing ulcers Pulmonary:   '[]'$ Uses home oxygen   '[]'$ Productive cough   '[]'$ Hemoptysis   '[]'$ Wheeze  '[]'$ COPD   '[]'$ Asthma Neurologic:  '[]'$ Dizziness  '[]'$ Blackouts   '[]'$ Seizures   '[]'$ History of stroke   '[]'$ History of TIA  '[]'$ Aphasia   '[]'$ Temporary blindness   '[]'$ Dysphagia   '[]'$ Weakness or numbness in arms   '[]'$ Weakness or numbness in legs Musculoskeletal:  '[x]'$ Arthritis   '[]'$ Joint swelling   '[x]'$ Joint pain   '[]'$ Low back pain Hematologic:  '[]'$ Easy bruising  '[]'$ Easy bleeding   '[]'$ Hypercoagulable state   '[]'$ Anemic  '[]'$ Hepatitis Gastrointestinal:  '[]'$ Blood in stool   '[]'$ Vomiting blood  '[x]'$ Gastroesophageal reflux/heartburn   '[]'$ Difficulty swallowing. Genitourinary:  '[]'$ Chronic kidney disease   '[]'$ Difficult urination  '[]'$ Frequent urination  '[]'$ Burning with urination   '[]'$ Blood in urine Skin:  '[]'$ Rashes   '[]'$ Ulcers   '[]'$ Wounds Psychological:  '[x]'$ History of anxiety   '[]'$  History of major depression.  Physical Examination  There were no  vitals filed for this visit. There is no height or weight on file to calculate BMI. Gen: WD/WN, NAD Head: [place]/AT, No temporalis wasting.  Ear/Nose/Throat: Hearing grossly intact, nares w/o erythema or drainage, oropharynx w/o Erythema/Exudate,  Eyes: Conjunctiva clear, sclera non-icteric Neck: Trachea midline.  No JVD.  Pulmonary:  Good air movement, respirations not labored, no use of accessory muscles.  Cardiac:  RRR, normal S1, S2. Port in place in the right chest Vascular:  Vessel Right Left  Radial Palpable Palpable                   Musculoskeletal: M/S 5/5 throughout.  Extremities without ischemic changes.  No deformity or atrophy.  Neurologic: Sensation grossly intact in extremities.  Symmetrical.  Speech is fluent. Motor exam as listed above. Psychiatric: Judgment intact, Mood & affect appropriate for pt's clinical situation. Dermatologic: No rashes or ulcers noted.  No cellulitis or open wounds. Lymph : No Cervical, Axillary, or Inguinal lymphadenopathy.     CBC Lab Results  Component Value Date   WBC 4.0 05/18/2022   HGB 14.4 05/18/2022   HCT 43.1 05/18/2022   MCV 92.9 05/18/2022   PLT 255 05/18/2022    BMET    Component Value Date/Time   NA 138 05/18/2022 1440   NA 140 07/28/2021 1459   K 4.1 05/18/2022 1440   CL 105 05/18/2022 1440   CO2 28 05/18/2022 1440   GLUCOSE 96 05/18/2022 1440   BUN 9 05/18/2022 1440   BUN 13 07/28/2021 1459   CREATININE 0.70 05/18/2022 1440   CALCIUM 9.9 05/18/2022 1440   GFRNONAA >60 05/18/2022 1440   GFRAA >60 03/08/2020 1333   CrCl cannot be calculated (Patient's most recent lab result is older than the maximum 21 days allowed.).  COAG No results found for: "INR", "PROTIME"  Radiology No results found.   Assessment/Plan 1.  Ovarian cancer, completed therapy.  Desires to have port removed and we will remove it today. 2.  Hypertension.  Stable on outpatient medications and blood pressure control important in reducing  the progression of atherosclerotic disease. On appropriate oral medications.    Leotis Pain, MD  08/16/2022 9:04 AM

## 2022-08-16 NOTE — Op Note (Signed)
[facility]       Operative Note  Date: 08/16/2022  Preoperative diagnosis:  1. Ovarian cancer, no longer using port  Postoperative diagnosis:  Same as above  Procedures: #1. Removal of right jugular port a cath   Surgeon: Leotis Pain, MD  Anesthesia: Local with moderate conscious sedation for 15 minutes using 2 mg of Versed and 50 mcg of Fentanyl  Fluoroscopy time: none  Contrast used: 0  Estimated blood loss: Minimal  Indication for the procedure:  The patient is a 77 y.o. female who has completed her treatments for ovarian cancer and no longer needs their Port-A-Cath. The patient desires to have this removed. Risks and benefits including need for potential replacement with recurrent disease were discussed and patient is agreeable to proceed.  Description of procedure: The patient was brought to the vascular and interventional radiology suite. Moderate conscious sedation was administered during a face to face encounter with the patient throughout the procedure with my supervision of the RN administering medicines and monitoring the patient's vital signs, pulse oximetry, telemetry and mental status throughout from the start of the procedure until the patient was taken to the recovery room.  The right neck chest and shoulder were sterilely prepped and draped, and a sterile surgical field was created. The area was then anesthetized with 1% lidocaine copiously. The previous incision was reopened and electrocautery used to dissected down to the port and the catheter. These were dissected free and the catheter was gently removed from the vein in its entirety. The port was dissected out from the fibrous connective tissue and the Prolene sutures were removed. The port was then removed in its entirety including the catheter. The wound was then closed with a 3-0 Vicryl and a 4-0 Monocryl and Dermabond was placed as a dressing. The patient  was then taken to the recovery room in stable condition having tolerated the procedure well.  Complications: none  Condition: stable   Leotis Pain, MD 08/16/2022 11:12 AM   This note was created with Dragon Medical transcription system. Any errors in dictation are purely unintentional.

## 2022-08-20 DIAGNOSIS — L508 Other urticaria: Secondary | ICD-10-CM | POA: Diagnosis not present

## 2022-08-21 ENCOUNTER — Other Ambulatory Visit: Payer: Self-pay | Admitting: *Deleted

## 2022-08-21 DIAGNOSIS — C561 Malignant neoplasm of right ovary: Secondary | ICD-10-CM

## 2022-08-22 ENCOUNTER — Ambulatory Visit: Payer: Medicare Other

## 2022-08-22 ENCOUNTER — Inpatient Hospital Stay (HOSPITAL_BASED_OUTPATIENT_CLINIC_OR_DEPARTMENT_OTHER): Payer: Medicare Other | Admitting: Obstetrics and Gynecology

## 2022-08-22 ENCOUNTER — Inpatient Hospital Stay: Payer: Medicare Other

## 2022-08-22 VITALS — BP 146/72 | HR 72 | Temp 96.9°F | Resp 19 | Wt 177.9 lb

## 2022-08-22 DIAGNOSIS — R918 Other nonspecific abnormal finding of lung field: Secondary | ICD-10-CM | POA: Diagnosis not present

## 2022-08-22 DIAGNOSIS — Z806 Family history of leukemia: Secondary | ICD-10-CM | POA: Diagnosis not present

## 2022-08-22 DIAGNOSIS — Z800 Family history of malignant neoplasm of digestive organs: Secondary | ICD-10-CM | POA: Diagnosis not present

## 2022-08-22 DIAGNOSIS — D509 Iron deficiency anemia, unspecified: Secondary | ICD-10-CM

## 2022-08-22 DIAGNOSIS — Z8543 Personal history of malignant neoplasm of ovary: Secondary | ICD-10-CM

## 2022-08-22 DIAGNOSIS — Z90722 Acquired absence of ovaries, bilateral: Secondary | ICD-10-CM | POA: Diagnosis not present

## 2022-08-22 DIAGNOSIS — Z9079 Acquired absence of other genital organ(s): Secondary | ICD-10-CM | POA: Diagnosis not present

## 2022-08-22 DIAGNOSIS — Z803 Family history of malignant neoplasm of breast: Secondary | ICD-10-CM | POA: Diagnosis not present

## 2022-08-22 DIAGNOSIS — C561 Malignant neoplasm of right ovary: Secondary | ICD-10-CM | POA: Diagnosis not present

## 2022-08-22 DIAGNOSIS — Z90710 Acquired absence of both cervix and uterus: Secondary | ICD-10-CM | POA: Diagnosis not present

## 2022-08-22 DIAGNOSIS — I10 Essential (primary) hypertension: Secondary | ICD-10-CM | POA: Diagnosis not present

## 2022-08-22 DIAGNOSIS — K625 Hemorrhage of anus and rectum: Secondary | ICD-10-CM | POA: Insufficient documentation

## 2022-08-22 DIAGNOSIS — Z9221 Personal history of antineoplastic chemotherapy: Secondary | ICD-10-CM | POA: Diagnosis not present

## 2022-08-22 DIAGNOSIS — Z08 Encounter for follow-up examination after completed treatment for malignant neoplasm: Secondary | ICD-10-CM | POA: Diagnosis not present

## 2022-08-22 LAB — CBC WITH DIFFERENTIAL/PLATELET
Abs Immature Granulocytes: 0.01 10*3/uL (ref 0.00–0.07)
Basophils Absolute: 0 10*3/uL (ref 0.0–0.1)
Basophils Relative: 1 %
Eosinophils Absolute: 0.1 10*3/uL (ref 0.0–0.5)
Eosinophils Relative: 2 %
HCT: 45.6 % (ref 36.0–46.0)
Hemoglobin: 15.4 g/dL — ABNORMAL HIGH (ref 12.0–15.0)
Immature Granulocytes: 0 %
Lymphocytes Relative: 34 %
Lymphs Abs: 1.4 10*3/uL (ref 0.7–4.0)
MCH: 30.3 pg (ref 26.0–34.0)
MCHC: 33.8 g/dL (ref 30.0–36.0)
MCV: 89.8 fL (ref 80.0–100.0)
Monocytes Absolute: 0.4 10*3/uL (ref 0.1–1.0)
Monocytes Relative: 11 %
Neutro Abs: 2.1 10*3/uL (ref 1.7–7.7)
Neutrophils Relative %: 52 %
Platelets: 277 10*3/uL (ref 150–400)
RBC: 5.08 MIL/uL (ref 3.87–5.11)
RDW: 12.3 % (ref 11.5–15.5)
WBC: 3.9 10*3/uL — ABNORMAL LOW (ref 4.0–10.5)
nRBC: 0 % (ref 0.0–0.2)

## 2022-08-22 NOTE — Progress Notes (Signed)
Gynecologic Oncology Interval Visit  [facility]  Telephone:([phone] [phone]  Patient Care Team: Juline Patch, MD as PCP - General (Family Medicine) Lin Landsman, MD as Consulting Physician (Gastroenterology) Clent Jacks, RN as Oncology Nurse Navigator Mellody Drown, MD as Referring Physician (Obstetrics) Sindy Guadeloupe, MD as Consulting Physician (Oncology) [name], MD as Consulting Physician (Obstetrics and Gynecology)   Name of the patient: Carrie Roberson  [id number]  09/04/1945   Date of visit: 08/22/2022  Referring Provider: Dr. 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 G0P0 female seen in consultation from Dr. 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   CA 125 has been followed though not significantly elevated at diagnosis:  Component Ref Range & Units 3 mo ago (05/18/22) 8 mo ago (12/20/21) 9 mo ago (11/17/21) 1 yr ago (05/19/21) 1 yr ago (04/19/21) 1 yr ago (11/17/20) 2 yr ago (06/21/20)  Cancer Antigen (CA) 125 0.0 - 38.1 U/mL 8.7 7.6 CM 8.2 CM 8.4 CM 8.2 CM 8.4 CM 8.8 CM   She continues to see Dr. Georgianne Fick she saw him in the fall 2023 and had a reassuring exam.  She continues to feel well and denies complaints.  However she has noted bright red blood after bowel movements and she suspects she may have a hemorrhoid.  She has previously declined colonoscopy  Her last [place] mammogram were in 2022 and included a breast ultrasound with biopsy in June 30, 2021.  The biopsy was benign.  Per her report she had a mammogram in the fall 2023 which was normal.  Gynecologic Oncology History:  [place] concerning a midline  pelvic mass.  Initial presentation was prompted by frequent urination and bladder pessure. PVR was checked and noted to be normal but bladder scan showed residual fluid collection.  TVUS was ordered and revealed a 10.2 x 9.2 x 6.3 cm midline cystic pelvic mass. Symptoms initially started in August of 2020. She had previously been followed asymptomatic 1.42 x 1.58cm simple left ovarian cyst on 11/18/2017.     Appearance on 06/26/2019 ultrasound was notable simple cyst, normal doppler flow and no free fluid. The patient endorses associated symptoms of pelvic pressure.  The patient denies associated symptoms of  early satiety, weight gain, weight loss, night sweats, vaginal bleeding and nausea.  There is not a notable family history of ovarian cancer, uterine cancer, breast cancer, or colon cancer.  06/07/2018 Pelvic US Findings:  The uterus is anteverted and measures 6.8x3x2.7cm. Echo texture is homogenous without evidence of focal masses. The Endometrium measures 4.19 mm.   Right Ovary measures 4.7 cm3. It is normal in appearance. Left Ovary measures 5.2 cm3. It has a hypoechoic cyst = 1.4x1.33m Survey of the adnexa demonstrates no adnexal masses. There is no free fluid in the cul de sac. IMPRESSION:  1. Left Ovary measures 5.2 cm3. It has a hypoechoic cyst = 1.4x1.465m(possibility of hemorrhagic cyst vs others) 2. Cervical canal appears irregular d/t LEEP procedure  06/26/2019 Abdominal USKoreaFINDINGS: No ascites is demonstrated. There is a cystic midline pelvic mass which lies superior to the bladder, measuring 10.2 x 9.2 x 6.3 cm. IMPRESSION: 1. No ascites. 2. Large cystic mass  in the pelvis, not demonstrated on previous ultrasounds. This is suspicious for ovarian cystic neoplasm. Further evaluation recommended with dedicated pelvic ultrasound and/or abdominopelvic CT.  07/01/2019 MRI Reproductive: Uterus: Measures 4.3 x 2.0 by 5.9 cm. Scattered nabothian cysts identified. No mass.  Right  ovary measures the 10.5 x 6.8 by 9.0 cm (volume = 340 cm^3). There is a large cystic mass arising from the right ovary which has a maximum dimension of 10.8 cm. An eccentric, enhancing mural nodule is identified measuring 2.6 x 1.2 cm, image 50/10. No internal septation. Left ovary measures 2.9 x 2.1 by 1.8 cm. (Volume = 5.7 cm^3). This contains several small cyst which measure up to 1.4 cm.  06/29/2019 labs Cancer Antigen (CA) 125 0.0 - 38.1 U/mL 10.3   Comment: [employer] Electrochemiluminescence Immunoassay (ECLIA)  Values obtained with different assay methods or kits cannot be  used interchangeably.  Results cannot be interpreted as absolute  evidence of the presence or absence of malignant disease.   HE4 0.0 - 96.9 pmol/L 68.8   Comment: [employer] Electrochemiluminescence Immunoassay (ECLIA)  Values obtained with different assay methods or kits cannot be  used interchangeably.  Results cannot be interpreted as absolute  evidence of the presence or absence of malignant disease.       Postmenopausal ROMA See below 1.21    ROMA calculator 12%  Of note prior LEEP 2002 (negative pathology per patient) last pap 07/26/2014 NILM.   On 08/19/19 she under TAH-BSO with with peritoneal biopsies, pelvic/aortic lymph node dissection, pelvic washings, and omentectomy with Dr. Fransisca Connors and Dr. Georgianne Fick at [facility]  Uneventful recovery.    Pathology:  DIAGNOSIS:  A.  FALLOPIAN TUBE AND OVARY, RIGHT; RIGHT SALPINGO-OOPHORECTOMY:  - HIGH-GRADE SEROUS CARCINOMA OF THE OVARY.  - ANGIOLYMPHATIC INVASION IS PRESENT.  - SEE CANCER SUMMARY BELOW.  - FALLOPIAN TUBE WITH BENIGN PARATUBAL CYSTS.   B.  FALLOPIAN TUBE AND OVARY, LEFT; LEFT SALPINGO-OOPHORECTOMY:  - OVARY WITH SEROUS CYSTADENOMA MEASURING 1.5 CM AND CYSTIC FOLLICLES.  - FALLOPIAN TUBE WITH BENIGN PARATUBAL CYSTS.  - NEGATIVE FOR ATYPIA AND MALIGNANCY.   C.  PERITONEAL STONE; EXCISION:  - STONE, 1.7 CM (GROSS ONLY).   D. UTERUS  WITH CERVIX; TOTAL HYSTERECTOMY:  - CERVIX WITH ENDOCERVICAL POLYP MEASURING 0.5 CM, NABOTHIAN CYSTS, AND  TUNNEL CLUSTERS.  - INACTIVE ENDOMETRIUM WITH SMALL ENDOMETRIAL POLYP.  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  - NEGATIVE FOR ATYPIA AND MALIGNANCY.   E.  RIGHT GUTTER; BIOPSY:  - BENIGN MESOTHELIAL LINED FIBROADIPOSE TISSUE.  - NEGATIVE FOR MALIGNANCY.   F.  BLADDER FLAP PERITONEUM; BIOPSY:  - BENIGN FIBROADIPOSE TISSUE.  - NEGATIVE FOR MALIGNANCY.   G.  LYMPH NODE, RIGHT COMMON ILIAC; EXCISION:  - THREE LYMPH NODES NEGATIVE FOR MALIGNANCY (0/3).   H.  LYMPH NODE, RIGHT PELVIC; EXCISION:  - ONE LYMPH NODE NEGATIVE FOR MALIGNANCY (0/1).   I.  OMENTUM; OMENTECTOMY:  - BENIGN FIBROADIPOSE TISSUE.  - NEGATIVE FOR MALIGNANCY.   J.  LYMPH NODE, RIGHT AORTIC; EXCISION:  - ONE LYMPH NODE NEGATIVE FOR MALIGNANCY (0/1).  DIAGNOSIS:  A. PELVIC WASHINGS:  - NEGATIVE FOR MALIGNANCY.  - ERYTHROCYTES AND REACTIVE MESOTHELIAL CELLS  Pathologic Staging: FIGO 1C2  Post op course has been uncomplicated. She received her second covid 19 vaccine. She received lovenox for 28 day course.    09/29/2019 CT C/A/P IMPRESSION: 1. Suspected thyroid nodule along the inferior isthmus margin of the thyroid gland along with a separate  anterior mediastinal fairly high density mass with punctate calcifications. 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 Thyroid scintigraphy should be considered to further characterize these lesions; if negative on scintigraphy then further workup to exclude the possibility of lymphoma or thymic tumor would be suggested. 2. Contrast medium in the distal esophagus suggesting reflux or dysmotility. 3. Several tiny pulmonary nodules are present, 3 mm in average diameter or less. No follow-up needed if patient is low-risk (and has no  known or suspected primary neoplasm). Non-contrast chest CT can be considered in 12 months if patient is high-risk.  4. Aortoiliac atherosclerotic vascular disease. 5. Multilevel lumbar degenerative disc disease. 6. Prior laparotomy with some faint stranding along the anterior omentum in the immediate vicinity of the laparotomy site, likely from benign scarring.  6/21  completed  carbo-taxol chemotherapy with Dr. Janese Banks  December 2022 She experienced RLQ abdominal pain and underwent CT A/P WO contrast in  which showed possible inflammatory/infectious colitis, sigmoid diverticulosis. No adnexal masses noted or lymphadenopathy.   July 2023 Colonoscopy was planned but patient cancelled.    CA 125 trend At diagnosis:  10.3 06/29/2019      10.3 09/15/2019      18.1  12/29/2019       7.7 06/21/20 8.8 11/17/20  8.4 11/17/21 8.2      Genetic testing for germline BRCA1/2 mutations negative.   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  The CancerNext gene panel offered by [employer] includes sequencing and rearrangement analysis for the following 34 genes:   APC, ATM, BARD1, BMPR1A, BRCA1, BRCA2, BRIP1, CDH1, CDK4, CDKN2A, CHEK2, DICER1, HOXB13, EPCAM, GREM1, MLH1, MRE11A, MSH2, MSH6, MUTYH, NBN, NF1, PALB2, PMS2, POLD1, POLE, PTEN, RAD50, RAD51C, RAD51D, SMAD4, SMARCA4, STK11, and TP53.  The report date is 10/29/2019.   TumorHRD testing was negative.    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   Genetic testing 11/02/2019   Weight loss, unintentional 10/16/2019   Chemotherapy induced diarrhea 10/13/2019   Orthostasis 10/13/2019   Hypokalemia 10/13/2019   Restless leg 10/13/2019   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 10/13/2019   Iron deficiency 10/02/2019   Anxiety associated with cancer diagnosis  ([place]) 10/02/2019   Chemotherapy-induced nausea 10/02/2019   Thyroid nodule 10/02/2019   Hyponatremia 10/02/2019   B12 deficiency 09/18/2019   Family history of breast cancer    Family history of leukemia    Goals of care, counseling/discussion 09/07/2019   Ovarian mass 08/19/2019   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) 08/19/2019   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HCC)    Cyst of ovary 07/15/2019   Anxiety 09/02/2017   DDD (degenerative disc disease), 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   FH: diabetes mellitus 09/02/2017   Obesity, Class I, BMI 30.0-34.9 (see actual BMI) 09/02/2017   Atypical nevus 09/02/2017   IBS (irritable bowel syndrome) 09/02/2017   Atrophic vaginitis 09/02/2017   Chronic idiopathic urticaria 09/02/2017   GERD (gastroesophageal reflux disease) 09/02/2017    Past Medical History:  Past Medical History:  Diagnosis Date   Anxiety    Arthritis    Diverticulitis    Family history of breast cancer    Family history of leukemia    GERD (gastroesophageal reflux disease)    History of  ischemic colitis 09/02/2017   Hx of colonic polyp    Hypertension    H/O-PT TAKEN OFF BY PCP AS OF 2019   IBS (irritable bowel syndrome)    Ovarian cancer ([place])    Ovarian cyst    Personal history of chemotherapy 2021   6 treatments   Vaginal atrophy     Past Surgical History: Past Surgical History:  Procedure Laterality Date   ABDOMINAL HYSTERECTOMY     BREAST BIOPSY Left    needle bx/clip-neg   BREAST BIOPSY Right 06/28/2021   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   CERVICAL BIOPSY  W/ LOOP ELECTRODE EXCISION     CHOLECYSTECTOMY     Colonoscopoy  2007, 2008, 2011   COLONOSCOPY WITH PROPOFOL N/A 05/05/2018   Procedure: COLONOSCOPY WITH PROPOFOL;  Surgeon: Lin Landsman, MD;  Location: [facility] ENDOSCOPY;  Service: Gastroenterology;  Laterality: N/A;   FLEXIBLE SIGMOIDOSCOPY N/A 07/20/2021   Procedure: FLEXIBLE  SIGMOIDOSCOPY;  Surgeon: Lesly Rubenstein, MD;  Location: [facility] ENDOSCOPY;  Service: Endoscopy;  Laterality: N/A;   HYSTERECTOMY ABDOMINAL WITH SALPINGO-OOPHORECTOMY N/A 08/19/2019   Procedure: HYSTERECTOMY ABDOMINAL WITH SALPINGO-OOPHORECTOMY WITH PELVIC WASHINGS PERITONEAL BIOPSIES AND PARA AORTIC LYMPH NODE DISSECTION;  Surgeon: [name], MD;  Location: [facility] ORS;  Service: Gynecology;  Laterality: N/A;   LEEP     OMENTECTOMY N/A 08/19/2019   Procedure: OMENTECTOMY;  Surgeon: [name], MD;  Location: [facility] ORS;  Service: Gynecology;  Laterality: N/A;   OOPHORECTOMY     PORTA CATH INSERTION N/A 09/11/2019   Procedure: PORTA CATH INSERTION;  Surgeon: Algernon Huxley, MD;  Location: [place] [facility];  Service: Cardiovascular;  Laterality: N/A;   PORTA CATH REMOVAL N/A 08/16/2022   Procedure: PORTA CATH REMOVAL;  Surgeon: Algernon Huxley, MD;  Location: [place] [facility];  Service: Cardiovascular;  Laterality: N/A;   TUBAL LIGATION  1999    Past Gynecologic History:  Menarche: 9 Last Menstrual Period: menopausal History of Abnormal pap: No Contraception: TAH-SBO (07/2019); tubal ligation (1999)  OB History:  OB History  Gravida Para Term Preterm AB Living  0 0 0 0 0 0  SAB IAB Ectopic Multiple Live Births  0 0 0 0 0    Family History: Family History  Problem Relation Age of Onset   Heart failure Mother    Atrial fibrillation Mother    Hypertension Mother    Diabetes Father    Heart attack Father    Breast cancer Maternal Aunt 74   Breast cancer Maternal Grandmother 36   Breast cancer Cousin        2 mat cousins   Hypertension Sister    Diabetes Sister    Blindness Sister    Hypertension Sister    Arthritis Sister    Leukemia Paternal Aunt    Leukemia Paternal Uncle    Brain cancer Paternal Aunt    Other Brother 33       Drowning accident   Liver cancer Maternal Uncle    Alcohol abuse Maternal Uncle    Prostate cancer Neg Hx    Kidney cancer Neg  Hx    BRCA 1/2 Neg Hx    Social History: Retired  Science writer History   Socioeconomic History   Marital status: Single    Spouse name: Not on file   Number of children: 0   Years of education: Not on file   Highest education level: Associate degree: academic program  Occupational History   Occupation: Retired  Tobacco Use   Smoking status: Never    Passive exposure: Never   Smokeless tobacco: Never   Tobacco comments:    2bd hand smoke from father  Vaping Use   Vaping Use: Never used  Substance and Sexual Activity   Alcohol use: Never    Comment: rarely   Drug use: Never   Sexual activity: Not Currently    Birth control/protection: Post-menopausal  Other Topics Concern   Not on file  Social History Narrative   Not on file   Social Determinants of Health   Financial Resource Strain: Low Risk  (04/26/2022)   Overall Financial Resource Strain (CARDIA)    Difficulty of Paying Living Expenses: Not hard at all  Food Insecurity: No Food Insecurity (04/26/2022)   Hunger Vital Sign    Worried About Running Out of Food in the Last Year: Never true    [place] in the Last Year: Never true  Transportation Needs: No Transportation Needs (04/26/2022)   PRAPARE - Hydrologist (Medical): No    Lack of Transportation (Non-Medical): No  Physical Activity: Inactive (04/26/2022)   Exercise Vital Sign    Days of Exercise per Week: 0 days    Minutes of Exercise per Session: 0 min  Stress: Stress Concern Present (04/26/2022)   [place]    Feeling of Stress : To some extent  Social Connections: Socially Isolated (04/26/2022)   Social Connection and Isolation Panel [NHANES]    Frequency of Communication with Friends and Family: More than three times a week    Frequency of Social Gatherings with Friends and Family: Once a week    Attends Religious Services: Never    Marine scientist or  Organizations: No    Attends Archivist Meetings: Never    Marital Status: Never married  Intimate Partner Violence: Not At Risk (04/26/2022)   Humiliation, Afraid, Rape, and Kick questionnaire    Fear of Current or Ex-Partner: No    Emotionally Abused: No    Physically Abused: No    Sexually Abused: No   Allergies: Allergies  Allergen Reactions   Citalopram Nausea And Vomiting   Ivp Dye [Iodinated Contrast Media] Hives and Swelling    hives   Meloxicam     unknown   Adhesive [Tape] Rash   Amlodipine Rash   Ampicillin Rash    Did it involve swelling of the face/tongue/throat, SOB, or low BP? No Did it involve sudden or severe rash/hives, skin peeling, or any reaction on the inside of your mouth or nose? Yes Did you need to seek medical attention at a hospital or doctor's office? Yes When did it last happen?       10 + years If all above answers are "NO", may proceed with cephalosporin use.   Cefoxitin Rash   Chlorhexidine Rash    Chloraprep Scrub Teal   Estradiol Rash   Estropipate Rash   Olmesartan Medoxomil-Hctz Rash   Sertraline Rash        Sulfa Antibiotics Rash   Current Medications: Current Outpatient Medications  Medication Sig Dispense Refill   acetaminophen (TYLENOL) 500 MG tablet Take 1,000 mg by mouth every 6 (six) hours as needed (for pain.).     lisinopril (ZESTRIL) 5 MG tablet TAKE 1 TABLET (5 MG TOTAL) BY MOUTH DAILY. 90 tablet 1   loratadine (CLARITIN) 10 MG tablet Take 10 mg by mouth daily. otc  LORazepam (ATIVAN) 0.5 MG tablet Take 0.5 mg by mouth daily as needed for anxiety.     MYRBETRIQ 50 MG TB24 tablet TAKE 1 TABLET BY MOUTH EVERY DAY 30 tablet 11   nitrofurantoin, macrocrystal-monohydrate, (MACROBID) 100 MG capsule TAKE 1 CAPSULE BY MOUTH EVERY DAY 30 capsule 2   omeprazole (PRILOSEC) 10 MG capsule Take 10 mg by mouth daily.     pregabalin (LYRICA) 75 MG capsule Take 1 capsule (75 mg total) by mouth 2 (two) times daily. 60 capsule 3    Probiotic Product (UP4 PROBIOTICS WOMENS PO) Take 1 tablet by mouth daily.     triamcinolone (KENALOG) 0.025 % cream Apply topically 2 (two) times daily.     venlafaxine XR (EFFEXOR-XR) 37.5 MG 24 hr capsule Take 1 capsule (37.5 mg total) by mouth daily with breakfast. 90 capsule 0   hydrOXYzine (ATARAX) 25 MG tablet Take 25 mg by mouth daily. (Patient not taking: Reported on 08/16/2022)     Polyethylene Glycol 3350 (MIRALAX PO) Take by mouth. (Patient not taking: Reported on 08/16/2022)     predniSONE (DELTASONE) 10 MG tablet Take 1 tablet (10 mg total) by mouth daily with breakfast. (Patient not taking: Reported on 08/16/2022) 30 tablet 0   XYZAL ALLERGY 24HR 5 MG tablet Take 10 mg by mouth daily. (Patient not taking: Reported on 08/16/2022)     No current facility-administered medications for this visit.   Facility-Administered Medications Ordered in Other Visits  Medication Dose Route Frequency Provider Last Rate Last Admin   sodium chloride flush (NS) 0.9 % injection 10 mL  10 mL Intravenous PRN Sindy Guadeloupe, MD   10 mL at 01/05/20 0750     Review of Systems General:  no complaints Skin: no complaints Eyes: no complaints HEENT: no complaints Breasts: no complaints Pulmonary: no complaints Cardiac: no complaints Gastrointestinal: no complaints Genitourinary/Sexual: no complaints Ob/Gyn: no complaints Musculoskeletal: no complaints Hematology: no complaints Neurologic/Psych: no complaints   Objective:  Physical Examination:  Vitals:   08/22/22 1015  BP: (!) 146/72  Pulse: 72  Resp: 19  Temp: (!) 96.9 F (36.1 C)  SpO2: 97%  Weight: 177 lb 14.4 oz (80.7 kg)     ECOG Performance Status: 0 - Asymptomatic  GENERAL: Patient is a well appearing female in no acute distress HEENT:  PERRL, neck supple with midline trachea. Thyroid without masses.  NODES:  No cervical, supraclavicular, axillary, or inguinal lymphadenopathy palpated.  LUNGS: Normal respiratory  effort CHEST: s/p port removal.  Incision close dry and intact.  Small areas of ecchymosis consistent with postoperative change ABDOMEN:  Soft, nontender.  Nondistended.  No evidence of masses ascites.  Vertical incision is well-healed and there is no hernias MSK:  No back pain EXTREMITIES:  No peripheral edema.   NEURO:  Nonfocal. Well oriented.  Appropriate affect.  Pelvic: EGBUS: no lesions Cervix: Absent Vagina: no lesions, no discharge or bleeding Uterus: Absent BME: no palpable masses Rectovaginal: confirmatory.  No gross evidence of blood.  She may have an internal hemorrhoid at 12:00   Labs:  CA125 and CBC pending  Radiologic Imaging: No imaging on site today.     Assessment:  Carrie Roberson is a 77 y.o. 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  Finished 6 cycles of carbo/taxol chemotherapy with Neulasta 6/21. NED.   CA125/HE4 not elevated at diagnosis but has been followed.  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  S/p Port-a-cath-removal   Medical co-morbidities complicating care: HTN, obesity and prior abdominal surgery (BTL and cholecystectomy). Body mass index is 31.51 kg/m.   Plan:   Problem List Items Addressed This Visit   None Visit Diagnoses     Rectal bleeding    -  Primary   Iron deficiency anemia, unspecified iron deficiency anemia type       Encounter for follow-up surveillance of ovarian cancer          PARP inhibitor maintenance therapy not indicated for stage I disease (even if she had a BRCA mutation).  Does not need somatic tumor testing, as PARP inhibitor maintenance not approved for stage I disease.   Will follow-up her CA125 and obtained a CBC today given her complaint of rectal bleeding.   Based on her history it sounds like these are hemorrhoids.  We discussed colonoscopy as part of his workup however she has declined in the past.  Will check if her CBC shows any evidence of anemia.  If so we will request that she discuss discuss with her the role of colonoscopy with the GI team again.  Continue every 6 month surveillance with gyn-onc and Dr. Georgianne Fick and will be traveling to his new practice in [facility]. We will see her back in 12 months.   Follow CA 125 at each visit. Estimated risk of recurrence 10-20%.    Larna Capelle Gaetana Michaelis, MD

## 2022-08-23 LAB — CA 125: Cancer Antigen (CA) 125: 9 U/mL (ref 0.0–38.1)

## 2022-08-25 NOTE — Progress Notes (Unsigned)
BH MD/PA/NP OP Progress Note  08/27/2022 5:05 PM Carrie Roberson  MRN:  [id number]  Chief Complaint:  Chief Complaint  Patient presents with   Follow-up   HPI:  This is a follow-up appointment for depression and anxiety.  She states that her sister in [place] died unexpectedly.  She was found to have diabetes, which was un treated more than 10 years.  She states that she has lot of questions, although she thinks there may be no answers.  She reports frustration about her nephew.  She states that everything was about him, and felt disrespected.  He provoked the argument with her sister on the way to [place].  They were yelling with each other. She has been taking clonazepam more frequently for anxiety, although not every day. The patient has mood symptoms as in PHQ-9/GAD-7.She has been eating more as she is stressed. She denies SI. She denies alcohol use or drug use.  She does not want to adjust her medication at this time, although she would be open to it if she continues to struggle with her mood at the next visit.   Household: sister, two dogs Marital status: single Number of children: 0 Employment: retired in 2002, Barrister's clerk:   She was raised in [place]. She has three siblings. Her brother deceased from drowning accident at age 79. She was 77 year old that time. She states that she thinks about him every day, and has never passed it.  She was taking care of her mother, who passed away at age 56. Although she misses her mother, she thinks it was better for her as her mother did not have good quality of life later in the course.   Wt Readings from Last 3 Encounters:  08/27/22 177 lb (80.3 kg)  08/22/22 177 lb 14.4 oz (80.7 kg)  08/16/22 175 lb (79.4 kg)    Visit Diagnosis:    ICD-10-CM   1. Generalized anxiety disorder  F41.1     2. MDD (major depressive disorder), recurrent episode, mild ([place])  F33.0       Past Psychiatric History: Please see initial evaluation for full details. I  have reviewed the history. No updates at this time.     Past Medical History:  Past Medical History:  Diagnosis Date   Anxiety    Arthritis    Diverticulitis    Family history of breast cancer    Family history of leukemia    GERD (gastroesophageal reflux disease)    History of ischemic colitis 09/02/2017   Hx of colonic polyp    Hypertension    H/O-PT TAKEN OFF BY PCP AS OF 2019   IBS (irritable bowel syndrome)    Ovarian cancer ([place])    Ovarian cyst    Personal history of chemotherapy 2021   6 treatments   Vaginal atrophy     Past Surgical History:  Procedure Laterality Date   ABDOMINAL HYSTERECTOMY     BREAST BIOPSY Left    needle bx/clip-neg   BREAST BIOPSY Right 06/28/2021   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   CERVICAL BIOPSY  W/ LOOP ELECTRODE EXCISION     CHOLECYSTECTOMY     Colonoscopoy  2007, 2008, 2011   COLONOSCOPY WITH PROPOFOL N/A 05/05/2018   Procedure: COLONOSCOPY WITH PROPOFOL;  Surgeon: Lin Landsman, MD;  Location: [facility];  Service: Gastroenterology;  Laterality: N/A;   FLEXIBLE SIGMOIDOSCOPY N/A 07/20/2021   Procedure: FLEXIBLE SIGMOIDOSCOPY;  Surgeon: Lesly Rubenstein, MD;  Location: [facility];  Service: Endoscopy;  Laterality: N/A;   HYSTERECTOMY ABDOMINAL WITH SALPINGO-OOPHORECTOMY N/A 08/19/2019   Procedure: HYSTERECTOMY ABDOMINAL WITH SALPINGO-OOPHORECTOMY WITH PELVIC WASHINGS PERITONEAL BIOPSIES AND PARA AORTIC LYMPH NODE DISSECTION;  Surgeon: [name], MD;  Location: [facility];  Service: Gynecology;  Laterality: N/A;   LEEP     OMENTECTOMY N/A 08/19/2019   Procedure: OMENTECTOMY;  Surgeon: [name], MD;  Location: [facility];  Service: Gynecology;  Laterality: N/A;   OOPHORECTOMY     PORTA CATH INSERTION N/A 09/11/2019   Procedure: PORTA CATH INSERTION;  Surgeon: Algernon Huxley, MD;  Location: [facility];  Service: Cardiovascular;  Laterality: N/A;   PORTA CATH REMOVAL N/A 08/16/2022    Procedure: PORTA CATH REMOVAL;  Surgeon: Algernon Huxley, MD;  Location: [facility];  Service: Cardiovascular;  Laterality: N/A;   TUBAL LIGATION  1999    Family Psychiatric History: Please see initial evaluation for full details. I have reviewed the history. No updates at this time.     Family History:  Family History  Problem Relation Age of Onset   Heart failure Mother    Atrial fibrillation Mother    Hypertension Mother    Diabetes Father    Heart attack Father    Breast cancer Maternal Aunt 67   Breast cancer Maternal Grandmother 61   Breast cancer Cousin        2 mat cousins   Hypertension Sister    Diabetes Sister    Blindness Sister    Hypertension Sister    Arthritis Sister    Leukemia Paternal Aunt    Leukemia Paternal Uncle    Brain cancer Paternal Aunt    Other Brother 41       Drowning accident   Liver cancer Maternal Uncle    Alcohol abuse Maternal Uncle    Prostate cancer Neg Hx    Kidney cancer Neg Hx    BRCA 1/2 Neg Hx     Social History:  Social History   Socioeconomic History   Marital status: Single    Spouse name: Not on file   Number of children: 0   Years of education: Not on file   Highest education level: Associate degree: academic program  Occupational History   Occupation: Retired  Tobacco Use   Smoking status: Never    Passive exposure: Never   Smokeless tobacco: Never   Tobacco comments:    2bd hand smoke from father  Vaping Use   Vaping Use: Never used  Substance and Sexual Activity   Alcohol use: Never    Comment: rarely   Drug use: Never   Sexual activity: Not Currently    Birth control/protection: Post-menopausal  Other Topics Concern   Not on file  Social History Narrative   Not on file   Social Determinants of Health   Financial Resource Strain: Low Risk  (04/26/2022)   Overall Financial Resource Strain (CARDIA)    Difficulty of Paying Living Expenses: Not hard at all  Food Insecurity: No Food Insecurity  (04/26/2022)   Hunger Vital Sign    Worried About Running Out of Food in the Last Year: Never true    Ran Out of Food in the Last Year: Never true  Transportation Needs: No Transportation Needs (04/26/2022)   PRAPARE - Hydrologist (Medical): No    Lack of Transportation (Non-Medical): No  Physical Activity: Inactive (04/26/2022)   Exercise Vital Sign  Days of Exercise per Week: 0 days    Minutes of Exercise per Session: 0 min  Stress: Stress Concern Present (04/26/2022)   [place]    Feeling of Stress : To some extent  Social Connections: Socially Isolated (04/26/2022)   Social Connection and Isolation Panel [NHANES]    Frequency of Communication with Friends and Family: More than three times a week    Frequency of Social Gatherings with Friends and Family: Once a week    Attends Religious Services: Never    Marine scientist or Organizations: No    Attends Archivist Meetings: Never    Marital Status: Never married    Allergies:  Allergies  Allergen Reactions   Citalopram Nausea And Vomiting   Ivp Dye [Iodinated Contrast Media] Hives and Swelling    hives   Meloxicam     unknown   Adhesive [Tape] Rash   Amlodipine Rash   Ampicillin Rash    Did it involve swelling of the face/tongue/throat, SOB, or low BP? No Did it involve sudden or severe rash/hives, skin peeling, or any reaction on the inside of your mouth or nose? Yes Did you need to seek medical attention at a hospital or doctor's office? Yes When did it last happen?       10 + years If all above answers are "NO", may proceed with cephalosporin use.   Cefoxitin Rash   Chlorhexidine Rash    Chloraprep Scrub Teal   Estradiol Rash   Estropipate Rash   Olmesartan Medoxomil-Hctz Rash   Sertraline Rash        Sulfa Antibiotics Rash    Metabolic Disorder Labs: No results found for: "HGBA1C", "MPG" No  results found for: "PROLACTIN" Lab Results  Component Value Date   CHOL 188 09/02/2017   TRIG 131 09/02/2017   HDL 53 09/02/2017   CHOLHDL 3.5 09/02/2017   VLDL 26 09/02/2017   LDLCALC 109 (H) 09/02/2017   Lab Results  Component Value Date   TSH 1.535 07/19/2021   TSH 0.856 09/02/2017    Therapeutic Level Labs: No results found for: "LITHIUM" No results found for: "VALPROATE" No results found for: "CBMZ"  Current Medications: Current Outpatient Medications  Medication Sig Dispense Refill   acetaminophen (TYLENOL) 500 MG tablet Take 1,000 mg by mouth every 6 (six) hours as needed (for pain.).     hydrOXYzine (ATARAX) 25 MG tablet Take 25 mg by mouth daily. (Patient not taking: Reported on 08/16/2022)     lisinopril (ZESTRIL) 5 MG tablet TAKE 1 TABLET (5 MG TOTAL) BY MOUTH DAILY. 90 tablet 1   loratadine (CLARITIN) 10 MG tablet Take 10 mg by mouth daily. otc     LORazepam (ATIVAN) 0.5 MG tablet Take 1 tablet (0.5 mg total) by mouth daily as needed for anxiety. 30 tablet 1   MYRBETRIQ 50 MG TB24 tablet TAKE 1 TABLET BY MOUTH EVERY DAY 30 tablet 11   nitrofurantoin, macrocrystal-monohydrate, (MACROBID) 100 MG capsule TAKE 1 CAPSULE BY MOUTH EVERY DAY 30 capsule 2   omeprazole (PRILOSEC) 10 MG capsule Take 10 mg by mouth daily.     Polyethylene Glycol 3350 (MIRALAX PO) Take by mouth. (Patient not taking: Reported on 08/16/2022)     predniSONE (DELTASONE) 10 MG tablet Take 1 tablet (10 mg total) by mouth daily with breakfast. (Patient not taking: Reported on 08/16/2022) 30 tablet 0   pregabalin (LYRICA) 75 MG capsule Take 1 capsule (75 mg  total) by mouth 2 (two) times daily. 60 capsule 3   Probiotic Product (UP4 PROBIOTICS WOMENS PO) Take 1 tablet by mouth daily.     triamcinolone (KENALOG) 0.025 % cream Apply topically 2 (two) times daily.     venlafaxine XR (EFFEXOR-XR) 37.5 MG 24 hr capsule Take 1 capsule (37.5 mg total) by mouth daily with breakfast. 90 capsule 0   XYZAL ALLERGY 24HR  5 MG tablet Take 10 mg by mouth daily. (Patient not taking: Reported on 08/16/2022)     No current facility-administered medications for this visit.   Facility-Administered Medications Ordered in Other Visits  Medication Dose Route Frequency Provider Last Rate Last Admin   sodium chloride flush (NS) 0.9 % injection 10 mL  10 mL Intravenous PRN Sindy Guadeloupe, MD   10 mL at 01/05/20 0750     Musculoskeletal: Strength & Muscle Tone: within normal limits Gait & Station: normal Patient leans: N/A  Psychiatric Specialty Exam: Review of Systems  Psychiatric/Behavioral:  Positive for dysphoric mood. Negative for agitation, behavioral problems, confusion, decreased concentration, hallucinations, self-injury, sleep disturbance and suicidal ideas. The patient is nervous/anxious. The patient is not hyperactive.   All other systems reviewed and are negative.   Blood pressure 106/64, pulse 87, temperature 98.5 F (36.9 C), height 5' 0.5" (1.537 m), weight 177 lb (80.3 kg), SpO2 95 %.Body mass index is 34 kg/m.  General Appearance: Fairly Groomed  Eye Contact:  Good  Speech:  Clear and Coherent  Volume:  Normal  Mood:  Anxious and Depressed  Affect:  Appropriate, Congruent, and slightly down  Thought Process:  Coherent  Orientation:  Full (Time, Place, and Person)  Thought Content: Logical   Suicidal Thoughts:  No  Homicidal Thoughts:  No  Memory:  Immediate;   Good  Judgement:  Good  Insight:  Good  Psychomotor Activity:  Normal  Concentration:  Concentration: Good and Attention Span: Good  Recall:  Good  Fund of Knowledge: Good  Language: Good  Akathisia:  No  Handed:  Right  AIMS (if indicated): not done  Assets:  Communication Skills Desire for Improvement  ADL's:  Intact  Cognition: WNL  Sleep:  Good   Screenings: GAD-7    Flowsheet Row Office Visit from 08/27/2022 in [facility] Office Visit from 06/25/2022 in [facility] Office Visit from 05/04/2022 in [place] at [place] Visit from 03/05/2022 in [place] Office Visit from 10/27/2021 in [place] at [place]  Total GAD-7 Score 2 1 0 0 0      [place] Office Visit from 08/27/2022 in [place] Office Visit from 06/25/2022 in [place] Office Visit from 05/04/2022 in [place] at [place] from 04/26/2022 in [place] at [facility] Visit from 03/05/2022 in [place]  PHQ-2 Total Score 0 0 0 2 0  PHQ-9 Total Score -- -- 0 5 --      [place] Office Visit from 08/27/2022 in [place] Office Visit from 06/25/2022 in [place] Office Visit from 01/08/2022 in [place] No Risk No Risk No Risk  Assessment and Plan:  Veneda Kirksey is a 77 y.o. 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   1. Generalized anxiety disorder 2. MDD (major depressive disorder), recurrent episode, mild ([facility]) Acute stressors include: unexpected loss of her sister in [place], who was suffering from diabetes, conflict with her niece  Other stressors include: conflict with her sister, loss of her mother, her brother age 63, history of ovarian cancer    History:   Worsening in anxiety and depressive symptoms in the context of stressors as above.  Although it was recommended to adjust/switch to other  antidepressant, she prefers to stay on the current medication regimen at this time especially given her history of adverse reaction from a higher dose of venlafaxine.  Will continue current dose of venlafaxine to target depression and anxiety.  Will continue lorazepam as needed for anxiety.  She will continue to see her therapist.    Plan Continue venlafaxine 37.5 mg daily (imbalance issues from 75 mg daily) Continue lorazepam 0.5 mg daily as needed for anxiety  Next appointment: 4/2 at 4:30 for 30 mins, in person (Try melatonin 3 mg at night, 2 hours before going to the bed) - on probiotic   The patient demonstrates the following risk factors for suicide: Chronic risk factors for suicide include: psychiatric disorder of depression, anxiety . Acute risk factors for suicide include: unemployment and loss (financial, interpersonal, professional). Protective factors for this patient include: positive social support, coping skills, and hope for the future. Considering these factors, the overall suicide risk at this point appears to be low. Patient is appropriate for outpatient follow up.    Past trials of medication: she does not recall- sertraline (rash), citalopram (nausea) according to the chart        Collaboration of Care: Collaboration of Care: Other reviewed notes in Epic  Patient/Guardian was advised Release of Information must be obtained prior to any record release in order to collaborate their care with an outside provider. Patient/Guardian was advised if they have not already done so to contact the registration department to sign all necessary forms in order for [place] to release information regarding their care.   Consent: Patient/Guardian gives verbal consent for treatment and assignment of benefits for services provided during this visit. Patient/Guardian expressed understanding and agreed to proceed.    Norman Clay, MD 08/27/2022, 5:05 PM

## 2022-08-27 ENCOUNTER — Encounter: Payer: Self-pay | Admitting: Psychiatry

## 2022-08-27 ENCOUNTER — Ambulatory Visit (INDEPENDENT_AMBULATORY_CARE_PROVIDER_SITE_OTHER): Payer: Medicare Other | Admitting: Psychiatry

## 2022-08-27 VITALS — BP 106/64 | HR 87 | Temp 98.5°F | Ht 60.5 in | Wt 177.0 lb

## 2022-08-27 DIAGNOSIS — F330 Major depressive disorder, recurrent, mild: Secondary | ICD-10-CM

## 2022-08-27 DIAGNOSIS — F411 Generalized anxiety disorder: Secondary | ICD-10-CM

## 2022-08-27 MED ORDER — LORAZEPAM 0.5 MG PO TABS: 0.5000 mg | ORAL_TABLET | Freq: Every day | ORAL | 1 refills | Status: AC | PRN

## 2022-08-27 NOTE — Patient Instructions (Signed)
Continue venlafaxine 37.5 mg daily Continue lorazepam 0.5 mg daily as needed for anxiety Next appointment: 4/2 at 4:30

## 2022-09-06 ENCOUNTER — Inpatient Hospital Stay: Payer: Medicare Other | Attending: Oncology | Admitting: Licensed Clinical Social Worker

## 2022-09-06 DIAGNOSIS — C561 Malignant neoplasm of right ovary: Secondary | ICD-10-CM

## 2022-09-06 NOTE — Progress Notes (Signed)
[facility] CSW Counseling Note  Patient was referred by medical provider. Treatment type: Individual  Presenting Concerns: Patient and/or family reports the following symptoms/concerns: anxiety, depression, stress, and adjustment concerns Duration of problem: 1.5 years; Severity of problem: moderate   Orientation:oriented to person, place, time/date, situation, day of week, month of year, and year.   Affect: Appropriate and Depressed Risk of harm to self or others: No plan to harm self or others  Patient and/or Family's Strengths/Protective Factors: Social connections, Social and Emotional competence, Concrete supports in place (healthy food, safe environments, etc.), Sense of purpose, Physical Health (exercise, healthy diet, medication compliance, etc.), and Caregiver has knowledge of parenting & child developmentAbility for insight  Active sense of humor  Average or above average intelligence  Capable of independent living  Engineer, drilling fund of knowledge  Motivation for treatment/growth  Physical Health  Religious Affiliation  Special hobby/interest  Supportive family/friends  Work skills      Goals Addressed: Patient will:  Reduce symptoms of: anxiety, depression, and stress Increase knowledge and/or ability of: coping skills, healthy habits, stress reduction, and communication   Increase healthy adjustment to current life circumstances, Increase adequate support systems for patient/family, and Begin healthy grieving over loss   Progress towards Goals: Progressing   Interventions: Interventions utilized:  CBT, Solution Focused, Strength-based, Supportive, Family Systems, and Reframing      Assessment: Patient currently experiencing adjustment concerns post-treatment. Patient reports she continues to experience increased anxiety due to family dynamics. Patient stated she is experiencing some stress due to her sister's death. iscussed ways to  process her sisters death and her nephew's behavior. CSW discussed different skills to manage stressors and triggers to reduce anxiety, including communication and boundary setting. .        Plan: Follow up with CSW: 09/20/2022 @ 2:00PM Behavioral recommendations: ontinue using skills  and communication  Referral(s): Support group(s):  N/A         [employer], LCSW

## 2022-09-20 ENCOUNTER — Inpatient Hospital Stay: Payer: Medicare Other | Admitting: Licensed Clinical Social Worker

## 2022-09-20 DIAGNOSIS — C561 Malignant neoplasm of right ovary: Secondary | ICD-10-CM

## 2022-09-20 NOTE — Progress Notes (Signed)
[facility] CSW Counseling Note  Patient was referred by medical provider. Treatment type: Individual  Presenting Concerns: Patient and/or family reports the following symptoms/concerns: anxiety, depression, stress, and adjustment post treatment Duration of problem: 1.5 years; Severity of problem: moderate   Orientation:oriented to person, place, time/date, situation, day of week, month of year, and year.   Affect: Appropriate Risk of harm to self or others: No plan to harm self or others  Patient and/or Family's Strengths/Protective Factors: Social connections, Social and Emotional competence, Concrete supports in place (healthy food, safe environments, etc.), Sense of purpose, Physical Health (exercise, healthy diet, medication compliance, etc.), and Caregiver has knowledge of parenting & child developmentAbility for insight  Active sense of humor  Average or above average intelligence  Capable of independent living  Engineer, drilling fund of knowledge  Motivation for treatment/growth  Physical Health  Religious Affiliation  Special hobby/interest  Supportive family/friends  Work skills      Goals Addressed: Patient will:  Reduce symptoms of: anxiety, depression, stress, and adjustment concerns Increase knowledge and/or ability of: coping skills, self-management skills, and stress reduction  Increase healthy adjustment to current life circumstances, Increase adequate support systems for patient/family, and Begin healthy grieving over loss   Progress towards Goals: Progressing   Interventions: Interventions utilized:  CBT, Play Therapy, Strength-based, Supportive, Family Systems, and Reframing      Assessment: Patient currently experiencing adjustment concerns post-treatment. Patient reports she continues to experience increased anxiety due to family dynamics. Patient  continues to  experience some stress due to her sister's death. Discussed ways to  continue processing her sisters death and her nephew's behavior. CSW discussed different skills to manage stressors and triggers to reduce anxiety, including communication and boundary setting.       Plan: Follow up with CSW: 10/04/2022 @ 2:00PM Behavioral recommendations:  continue using skills  and communication   Referral(s): Support group(s):  N/A          [employer], LCSW

## 2022-10-02 ENCOUNTER — Inpatient Hospital Stay: Payer: Medicare Other | Attending: Oncology | Admitting: Licensed Clinical Social Worker

## 2022-10-02 DIAGNOSIS — C561 Malignant neoplasm of right ovary: Secondary | ICD-10-CM

## 2022-10-02 NOTE — Progress Notes (Signed)
[facility] CSW Counseling Note  Patient was referred by medical provider. Treatment type: Individual  Presenting Concerns: Patient and/or family reports the following symptoms/concerns: anxiety, depression, stress, and adjustment concerns post treatment Duration of problem: 1.7 years; Severity of problem: mild   Orientation:oriented to person, place, time/date, situation, day of week, month of year, and year.   Affect: Appropriate Risk of harm to self or others: No plan to harm self or others  Patient and/or Family's Strengths/Protective Factors: Social connections, Social and Emotional competence, Concrete supports in place (healthy food, safe environments, etc.), Sense of purpose, Physical Health (exercise, healthy diet, medication compliance, etc.), Caregiver has knowledge of parenting & child development, and Parental ResilienceAbility for insight  Active sense of humor  Average or above average intelligence  Capable of independent living  Engineer, drilling fund of knowledge  Motivation for treatment/growth  Physical Health  Religious Affiliation  Special hobby/interest  Supportive family/friends  Work skills      Goals Addressed: Patient will:  Reduce symptoms of: anxiety, stress, and adjustment concerns Increase knowledge and/or ability of: coping skills, healthy habits, self-management skills, and stress reduction  Increase healthy adjustment to current life circumstances, Increase adequate support systems for patient/family, and Begin healthy grieving over loss   Progress towards Goals: Progressing   Interventions: Interventions utilized:  CBT, Motivational Interviewing, Strength-based, Supportive, and Reframing      Assessment: Patient currently experiencing anxiety and adjustment concerns post-treatment. Patient reports she her anxiety has reduced, and has been able to communicate her concerns with family members. Patient continues to  experience some stress due to her sister's death. Discussed ways to continue processing her sisters death and her nephew's behavior. CSW discussed different skills to manage stressors and triggers to reduce anxiety, including communication and boundary setting.  .      Plan: Follow up with CSW: 10/25/2022 @ 2:00PM Behavioral recommendations:  continue using skills  and communication  Referral(s): Support group(s):  N/A         [employer], LCSW

## 2022-10-04 ENCOUNTER — Telehealth: Payer: Self-pay

## 2022-10-04 NOTE — Telephone Encounter (Signed)
Called pt to schedule AWV. Pt declined.  KP

## 2022-10-16 ENCOUNTER — Other Ambulatory Visit: Payer: Self-pay | Admitting: Physician Assistant

## 2022-10-16 DIAGNOSIS — N390 Urinary tract infection, site not specified: Secondary | ICD-10-CM

## 2022-10-21 NOTE — Progress Notes (Deleted)
[facility] MD/PA/NP OP Progress Note  10/21/2022 3:09 PM Carrie Roberson  MRN:  [id number]  Chief Complaint: No chief complaint on file.  HPI: *** Visit Diagnosis: No diagnosis found.  Past Psychiatric History: Please see initial evaluation for full details. I have reviewed the history. No updates at this time.     Past Medical History:  Past Medical History:  Diagnosis Date   Anxiety    Arthritis    Diverticulitis    Family history of breast cancer    Family history of leukemia    GERD (gastroesophageal reflux disease)    History of ischemic colitis 09/02/2017   Hx of colonic polyp    Hypertension    H/O-PT TAKEN OFF BY PCP AS OF 2019   IBS (irritable bowel syndrome)    Ovarian cancer ([place])    Ovarian cyst    Personal history of chemotherapy 2021   6 treatments   Vaginal atrophy     Past Surgical History:  Procedure Laterality Date   ABDOMINAL HYSTERECTOMY     BREAST BIOPSY Left    needle bx/clip-neg   BREAST BIOPSY Right 06/28/2021   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   CERVICAL BIOPSY  W/ LOOP ELECTRODE EXCISION     CHOLECYSTECTOMY     Colonoscopoy  2007, 2008, 2011   COLONOSCOPY WITH PROPOFOL N/A 05/05/2018   Procedure: COLONOSCOPY WITH PROPOFOL;  Surgeon: Lin Landsman, MD;  Location: [facility];  Service: Gastroenterology;  Laterality: N/A;   FLEXIBLE SIGMOIDOSCOPY N/A 07/20/2021   Procedure: FLEXIBLE SIGMOIDOSCOPY;  Surgeon: Lesly Rubenstein, MD;  Location: [facility];  Service: Endoscopy;  Laterality: N/A;   HYSTERECTOMY ABDOMINAL WITH SALPINGO-OOPHORECTOMY N/A 08/19/2019   Procedure: HYSTERECTOMY ABDOMINAL WITH SALPINGO-OOPHORECTOMY WITH PELVIC WASHINGS PERITONEAL BIOPSIES AND PARA AORTIC LYMPH NODE DISSECTION;  Surgeon: [name], MD;  Location: [facility];  Service: Gynecology;  Laterality: N/A;   LEEP     OMENTECTOMY N/A 08/19/2019   Procedure: OMENTECTOMY;  Surgeon: [name], MD;  Location: [facility];  Service: Gynecology;   Laterality: N/A;   OOPHORECTOMY     PORTA CATH INSERTION N/A 09/11/2019   Procedure: PORTA CATH INSERTION;  Surgeon: Algernon [place], MD;  Location: [facility];  Service: Cardiovascular;  Laterality: N/A;   PORTA CATH REMOVAL N/A 08/16/2022   Procedure: PORTA CATH REMOVAL;  Surgeon: Algernon [place], MD;  Location: [facility];  Service: Cardiovascular;  Laterality: N/A;   TUBAL LIGATION  1999    Family Psychiatric History: Please see initial evaluation for full details. I have reviewed the history. No updates at this time.     Family History:  Family History  Problem Relation Age of Onset   Heart failure Mother    Atrial fibrillation Mother    Hypertension Mother    Diabetes Father    Heart attack Father    Breast cancer Maternal Aunt 65   Breast cancer Maternal Grandmother 40   Breast cancer Cousin        2 mat cousins   Hypertension Sister    Diabetes Sister    Blindness Sister    Hypertension Sister    Arthritis Sister    Leukemia Paternal Aunt    Leukemia Paternal Uncle    Brain cancer Paternal Aunt    Other Brother 32       Drowning accident   Liver cancer Maternal Uncle    Alcohol abuse Maternal Uncle    Prostate cancer Neg Hx  Kidney cancer Neg Hx    BRCA 1/2 Neg Hx     Social History:  Social History   Socioeconomic History   Marital status: Single    Spouse name: Not on file   Number of children: 0   Years of education: Not on file   Highest education level: Associate degree: academic program  Occupational History   Occupation: Retired  Tobacco Use   Smoking status: Never    Passive exposure: Never   Smokeless tobacco: Never   Tobacco comments:    2bd hand smoke from father  Vaping Use   Vaping Use: Never used  Substance and Sexual Activity   Alcohol use: Never    Comment: rarely   Drug use: Never   Sexual activity: Not Currently    Birth control/protection: Post-menopausal  Other Topics Concern   Not on file  Social History  Narrative   Not on file   Social Determinants of Health   Financial Resource Strain: [place]  (04/26/2022)   Overall Financial Resource Strain (CARDIA)    Difficulty of Paying Living Expenses: Not hard at all  Food Insecurity: No Food Insecurity (04/26/2022)   Hunger Vital Sign    Worried About Running Out of Food in the Last Year: Never true    [place] in the Last Year: Never true  Transportation Needs: No Transportation Needs (04/26/2022)   PRAPARE - Hydrologist (Medical): No    Lack of Transportation (Non-Medical): No  Physical Activity: Inactive (04/26/2022)   Exercise Vital Sign    Days of Exercise per Week: 0 days    Minutes of Exercise per Session: 0 min  Stress: Stress Concern Present (04/26/2022)   [place]    Feeling of Stress : To some extent  Social Connections: Socially Isolated (04/26/2022)   Social Connection and Isolation Panel [NHANES]    Frequency of Communication with Friends and Family: More than three times a week    Frequency of Social Gatherings with Friends and Family: Once a week    Attends Religious Services: Never    Marine scientist or Organizations: No    Attends Archivist Meetings: Never    Marital Status: Never married    Allergies:  Allergies  Allergen Reactions   Citalopram Nausea And Vomiting   Ivp Dye [Iodinated Contrast Media] Hives and Swelling    hives   Meloxicam     unknown   Adhesive [Tape] Rash   Amlodipine Rash   Ampicillin Rash    Did it involve swelling of the face/tongue/throat, SOB, or low BP? No Did it involve sudden or severe rash/hives, skin peeling, or any reaction on the inside of your mouth or nose? Yes Did you need to seek medical attention at a hospital or doctor's office? Yes When did it last happen?       10 + years If all above answers are "NO", may proceed with cephalosporin use.   Cefoxitin  Rash   Chlorhexidine Rash    Chloraprep Scrub Teal   Estradiol Rash   Estropipate Rash   Olmesartan Medoxomil-Hctz Rash   Sertraline Rash        Sulfa Antibiotics Rash    Metabolic Disorder Labs: No results found for: "HGBA1C", "MPG" No results found for: "PROLACTIN" Lab Results  Component Value Date   CHOL 188 09/02/2017   TRIG 131 09/02/2017   HDL 53 09/02/2017  CHOLHDL 3.5 09/02/2017   VLDL 26 09/02/2017   LDLCALC 109 (H) 09/02/2017   Lab Results  Component Value Date   TSH 1.535 07/19/2021   TSH 0.856 09/02/2017    Therapeutic Level Labs: No results found for: "LITHIUM" No results found for: "VALPROATE" No results found for: "CBMZ"  Current Medications: Current Outpatient Medications  Medication Sig Dispense Refill   acetaminophen (TYLENOL) 500 MG tablet Take 1,000 mg by mouth every 6 (six) hours as needed (for pain.).     hydrOXYzine (ATARAX) 25 MG tablet Take 25 mg by mouth daily. (Patient not taking: Reported on 08/16/2022)     lisinopril (ZESTRIL) 5 MG tablet TAKE 1 TABLET (5 MG TOTAL) BY MOUTH DAILY. 90 tablet 1   loratadine (CLARITIN) 10 MG tablet Take 10 mg by mouth daily. otc     LORazepam (ATIVAN) 0.5 MG tablet Take 1 tablet (0.5 mg total) by mouth daily as needed for anxiety. 30 tablet 1   MYRBETRIQ 50 MG TB24 tablet TAKE 1 TABLET BY MOUTH EVERY DAY 30 tablet 11   nitrofurantoin, macrocrystal-monohydrate, (MACROBID) 100 MG capsule TAKE 1 CAPSULE BY MOUTH EVERY DAY 30 capsule 2   omeprazole (PRILOSEC) 10 MG capsule Take 10 mg by mouth daily.     Polyethylene Glycol 3350 (MIRALAX PO) Take by mouth. (Patient not taking: Reported on 08/16/2022)     predniSONE (DELTASONE) 10 MG tablet Take 1 tablet (10 mg total) by mouth daily with breakfast. (Patient not taking: Reported on 08/16/2022) 30 tablet 0   pregabalin (LYRICA) 75 MG capsule Take 1 capsule (75 mg total) by mouth 2 (two) times daily. 60 capsule 3   Probiotic Product (UP4 PROBIOTICS WOMENS PO) Take 1  tablet by mouth daily.     triamcinolone (KENALOG) 0.025 % cream Apply topically 2 (two) times daily.     venlafaxine XR (EFFEXOR-XR) 37.5 MG 24 hr capsule Take 1 capsule (37.5 mg total) by mouth daily with breakfast. 90 capsule 0   XYZAL ALLERGY 24HR 5 MG tablet Take 10 mg by mouth daily. (Patient not taking: Reported on 08/16/2022)     No current facility-administered medications for this visit.   Facility-Administered Medications Ordered in Other Visits  Medication Dose Route Frequency Provider Last Rate Last Admin   sodium chloride flush (NS) 0.9 % injection 10 mL  10 mL Intravenous PRN Sindy Guadeloupe, MD   10 mL at 01/05/20 0750     Musculoskeletal: Strength & Muscle Tone: within normal limits Gait & Station: normal Patient leans: N/A  Psychiatric Specialty Exam: Review of Systems  There were no vitals taken for this visit.There is no height or weight on file to calculate BMI.  General Appearance: {Appearance:22683}  Eye Contact:  {BHH EYE CONTACT:22684}  Speech:  Clear and Coherent  Volume:  Normal  Mood:  {BHH MOOD:22306}  Affect:  {Affect (PAA):22687}  Thought Process:  Coherent  Orientation:  Full (Time, Place, and Person)  Thought Content: Logical   Suicidal Thoughts:  {ST/HT (PAA):22692}  Homicidal Thoughts:  {ST/HT (PAA):22692}  Memory:  Immediate;   Good  Judgement:  {Judgement (PAA):22694}  Insight:  {Insight (PAA):22695}  Psychomotor Activity:  Normal  Concentration:  Concentration: Good and Attention Span: Good  Recall:  Good  Fund of Knowledge: Good  Language: Good  Akathisia:  No  Handed:  Right  AIMS (if indicated): not done  Assets:  Communication Skills Desire for Improvement  ADL's:  Intact  Cognition: WNL  Sleep:  {BHH GOOD/FAIR/POOR:22877}   Screenings:  [place] Office Visit from 08/27/2022 in [place] Office Visit from 06/25/2022 in [place]  Office Visit from 05/04/2022 in [place] at [place] Visit from 03/05/2022 in [place] Office Visit from 10/27/2021 in [place] at [facility] [place]  Total GAD-7 Score 2 1 0 0 0      PHQ2-9    [place] Visit from 08/27/2022 in [place] Office Visit from 06/25/2022 in [place] Office Visit from 05/04/2022 in [place] at [place] from 04/26/2022 in [place] at [facility] Visit from 03/05/2022 in [place]  PHQ-2 Total Score 0 0 0 2 0  PHQ-9 Total Score -- -- 0 5 --      [place] Office Visit from 08/27/2022 in [place] Office Visit from 06/25/2022 in [place] Office Visit from 01/08/2022 in [place] No Risk No Risk No Risk        Assessment and Plan:  ??r Stofer is a 77 y.o. 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    1. Generalized anxiety disorder 2. MDD (major depressive disorder), recurrent episode, mild (HCC)  Acute stressors include: unexpected loss of her sister in [place], who was suffering from diabetes, conflict with her niece   Other stressors include: conflict with her sister, loss of her mother, her brother age 44, history of ovarian cancer     History:   Worsening in anxiety and depressive symptoms in the context of stressors as above.  Although it was recommended to adjust/switch to other antidepressant, she  prefers to stay on the current medication regimen at this time especially given her history of adverse reaction from a higher dose of venlafaxine.  Will continue current dose of venlafaxine to target depression and anxiety.  Will continue lorazepam as needed for anxiety.  She will continue to see her therapist.    Plan 1. Continue venlafaxine 37.5 mg daily (imbalance issues from 75 mg daily) 2. Continue lorazepam 0.5 mg daily as needed for anxiety  3. Next appointment: 4/2 at 4:30 for 30 mins, in person 4. (Try melatonin 3 mg at night, 2 hours before going to the bed) 5. - on probiotic   The patient demonstrates the following risk factors for suicide: Chronic risk factors for suicide include: psychiatric disorder of depression, anxiety . Acute risk factors for suicide include: unemployment and loss (financial, interpersonal, professional). Protective factors for this patient include: positive social support, coping skills, and hope for the future. Considering these factors, the overall suicide risk at this point appears to be low. Patient is appropriate for outpatient follow up.    Past trials of medication: she does not recall- sertraline (rash), citalopram (nausea) according to the chart       Collaboration of Care: Collaboration of Care: Other reviewed notes in Epic  Patient/Guardian was advised Release of Information must be obtained prior to any record release in order to collaborate their care with an outside  provider. Patient/Guardian was advised if they have not already done so to contact the registration department to sign all necessary forms in order for [place] to release information regarding their care.   Consent: Patient/Guardian gives verbal consent for treatment and assignment of benefits for services provided during this visit. Patient/Guardian expressed understanding and agreed to proceed.    Norman Clay, MD 10/21/2022, 3:09 PM

## 2022-10-23 ENCOUNTER — Ambulatory Visit: Payer: Medicare Other | Admitting: Psychiatry

## 2022-10-29 ENCOUNTER — Other Ambulatory Visit: Payer: Self-pay | Admitting: Psychiatry

## 2022-11-01 ENCOUNTER — Inpatient Hospital Stay: Payer: Medicare Other | Attending: Oncology | Admitting: Licensed Clinical Social Worker

## 2022-11-01 ENCOUNTER — Other Ambulatory Visit: Payer: Self-pay | Admitting: Psychiatry

## 2022-11-01 DIAGNOSIS — C561 Malignant neoplasm of right ovary: Secondary | ICD-10-CM

## 2022-11-01 DIAGNOSIS — G629 Polyneuropathy, unspecified: Secondary | ICD-10-CM | POA: Insufficient documentation

## 2022-11-01 DIAGNOSIS — Z8543 Personal history of malignant neoplasm of ovary: Secondary | ICD-10-CM | POA: Insufficient documentation

## 2022-11-01 DIAGNOSIS — Z90710 Acquired absence of both cervix and uterus: Secondary | ICD-10-CM | POA: Insufficient documentation

## 2022-11-01 DIAGNOSIS — Z9221 Personal history of antineoplastic chemotherapy: Secondary | ICD-10-CM | POA: Insufficient documentation

## 2022-11-01 DIAGNOSIS — Z862 Personal history of diseases of the blood and blood-forming organs and certain disorders involving the immune mechanism: Secondary | ICD-10-CM | POA: Insufficient documentation

## 2022-11-01 DIAGNOSIS — Z806 Family history of leukemia: Secondary | ICD-10-CM | POA: Insufficient documentation

## 2022-11-01 DIAGNOSIS — Z800 Family history of malignant neoplasm of digestive organs: Secondary | ICD-10-CM | POA: Insufficient documentation

## 2022-11-01 DIAGNOSIS — Z803 Family history of malignant neoplasm of breast: Secondary | ICD-10-CM | POA: Insufficient documentation

## 2022-11-01 NOTE — Progress Notes (Signed)
[facility] CSW Counseling Note  Patient was referred by medical provider. Treatment type: Individual  Presenting Concerns: Patient and/or family reports the following symptoms/concerns: anxiety, stress, and adjustment concerns post treatment Duration of problem: 1.8 years; Severity of problem: moderate   Orientation:oriented to person, place, time/date, situation, day of week, month of year, and year.   Affect: Appropriate and Tearful Risk of harm to self or others: No plan to harm self or others  Patient and/or Family's Strengths/Protective Factors: Social connections, Social and Emotional competence, Concrete supports in place (healthy food, safe environments, etc.), Sense of purpose, Physical Health (exercise, healthy diet, medication compliance, etc.), and Caregiver has knowledge of parenting & child developmentAbility for insight  Active sense of humor  Average or above average intelligence  Capable of independent living  Arboriculturist fund of knowledge  Motivation for treatment/growth  Physical Health  Religious Affiliation  Special hobby/interest  Supportive family/friends  Work skills      Goals Addressed: Patient will:  Reduce symptoms of: anxiety, stress, and adjustment concerns Increase knowledge and/or ability of: coping skills, healthy habits, self-management skills, and stress reduction  Increase healthy adjustment to current life circumstances, Increase adequate support systems for patient/family, and Begin healthy grieving over loss   Progress towards Goals: Progressing   Interventions: Interventions utilized:  CBT, Solution Focused, Strength-based, Supportive, Family Systems, and Reframing      Assessment: Patient currently experiencing  anxiety and adjustment concerns post-treatment. Patient reports she is experiencing increased symptoms in anxiety and grief in reference to her sister's death.  Patient stated she feels sad about  how her sister lived her life and wishes she could have done more. Discussed ways to continue processing her sisters death and keep communication open with other sister about what she is feeling. CSW discussed different skills to manage stressors and triggers to reduce anxiety, including communication and boundary setting.  . .      Plan: Follow up with CSW: 2 weeks Behavioral recommendations:  continue using skills  and communication  Referral(s): Support group(s):  N/A         Carrie Art, LCSW

## 2022-11-13 ENCOUNTER — Encounter: Payer: Self-pay | Admitting: Family Medicine

## 2022-11-13 ENCOUNTER — Ambulatory Visit (INDEPENDENT_AMBULATORY_CARE_PROVIDER_SITE_OTHER): Payer: Medicare Other | Admitting: Family Medicine

## 2022-11-13 VITALS — BP 124/72 | HR 85 | Temp 98.1°F | Ht 62.0 in | Wt 184.0 lb

## 2022-11-13 DIAGNOSIS — R051 Acute cough: Secondary | ICD-10-CM | POA: Diagnosis not present

## 2022-11-13 DIAGNOSIS — J0100 Acute maxillary sinusitis, unspecified: Secondary | ICD-10-CM

## 2022-11-13 MED ORDER — GUAIFENESIN-CODEINE 100-10 MG/5ML PO SYRP: 5.0000 mL | ORAL_SOLUTION | Freq: Three times a day (TID) | ORAL | 0 refills | Status: DC | PRN

## 2022-11-13 MED ORDER — AZITHROMYCIN 250 MG PO TABS: ORAL_TABLET | ORAL | 0 refills | Status: AC

## 2022-11-13 MED ORDER — FLUCONAZOLE 150 MG PO TABS: 150.0000 mg | ORAL_TABLET | Freq: Once | ORAL | 0 refills | Status: AC

## 2022-11-13 MED ORDER — BENZONATATE 100 MG PO CAPS: 100.0000 mg | ORAL_CAPSULE | Freq: Three times a day (TID) | ORAL | 0 refills | Status: DC | PRN

## 2022-11-13 NOTE — Progress Notes (Signed)
Date:  11/13/2022   Name:  Carrie Roberson   DOB:  [birth year]   MRN:  [id number]   Chief Complaint: Cough (Started with cough in March after coming on trip- clear production, no fever, L) side neck was swollen)  Cough This is a new problem. The current episode started 1 to 4 weeks ago. The problem has been waxing and waning. The cough is Productive of purulent sputum (green initially). Associated symptoms include headaches, nasal congestion, postnasal drip and a sore throat. Pertinent negatives include no chest pain, chills, ear pain, fever, hemoptysis, rhinorrhea, shortness of breath, sweats or wheezing. She has tried OTC cough suppressant for the symptoms.    Lab Results  Component Value Date   NA 138 05/18/2022   K 4.1 05/18/2022   CO2 28 05/18/2022   GLUCOSE 96 05/18/2022   BUN 9 05/18/2022   CREATININE 0.70 05/18/2022   CALCIUM 9.9 05/18/2022   EGFR 90 07/28/2021   GFRNONAA >60 05/18/2022   Lab Results  Component Value Date   CHOL 188 09/02/2017   HDL 53 09/02/2017   LDLCALC 109 (H) 09/02/2017   TRIG 131 09/02/2017   CHOLHDL 3.5 09/02/2017   Lab Results  Component Value Date   TSH 1.535 07/19/2021   No results found for: "HGBA1C" Lab Results  Component Value Date   WBC 3.9 (L) 08/22/2022   HGB 15.4 (H) 08/22/2022   HCT 45.6 08/22/2022   MCV 89.8 08/22/2022   PLT 277 08/22/2022   Lab Results  Component Value Date   ALT 14 05/18/2022   AST 16 05/18/2022   ALKPHOS 85 05/18/2022   BILITOT 0.4 05/18/2022   No results found for: "25OHVITD2", "25OHVITD3", "VD25OH"   Review of Systems  Constitutional:  Negative for chills and fever.  HENT:  Positive for postnasal drip, sinus pressure and sore throat. Negative for ear pain, mouth sores, rhinorrhea and sinus pain.   Respiratory:  Positive for cough. Negative for hemoptysis, shortness of breath and wheezing.   Cardiovascular:  Negative for chest pain and palpitations.  Neurological:  Positive for headaches.     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 10/13/2019   Iron deficiency 10/02/2019   Anxiety associated with cancer diagnosis (HCC) 10/02/2019   Chemotherapy-induced nausea 10/02/2019   Thyroid nodule 10/02/2019   Hyponatremia 10/02/2019   B12 deficiency 09/18/2019   Family history of breast cancer    Family history of leukemia    Goals of care, counseling/discussion 09/07/2019   Ovarian mass 08/19/2019   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) 08/19/2019   Malignant neoplasm of right ovary    Cyst of ovary 07/15/2019   Anxiety 09/02/2017   DDD (degenerative disc disease), 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 No Did it involve sudden or severe rash/hives, skin peeling, or any reaction on the inside of your mouth or nose? Yes Did you need to seek medical attention at a hospital or doctor's office?  Yes When did it last happen?       10 + years If all above answers are "NO", may proceed with cephalosporin use.   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   CERVICAL BIOPSY  W/ LOOP ELECTRODE EXCISION     CHOLECYSTECTOMY     Colonoscopoy  2007, 2008, 2011   COLONOSCOPY WITH PROPOFOL N/A 05/05/2018   Procedure: COLONOSCOPY WITH PROPOFOL;  Surgeon: Toney Reil, MD;  Location: [facility] ENDOSCOPY;  Service: Gastroenterology;  Laterality: N/A;   FLEXIBLE SIGMOIDOSCOPY N/A 07/20/2021   Procedure: FLEXIBLE SIGMOIDOSCOPY;  Surgeon: Regis Bill, MD;  Location: [facility] ENDOSCOPY;  Service: Endoscopy;  Laterality: N/A;   HYSTERECTOMY ABDOMINAL WITH SALPINGO-OOPHORECTOMY N/A 08/19/2019   Procedure: HYSTERECTOMY ABDOMINAL WITH SALPINGO-OOPHORECTOMY WITH PELVIC WASHINGS PERITONEAL BIOPSIES AND PARA AORTIC LYMPH NODE DISSECTION;  Surgeon: Vena Austria, MD;  Location: [facility] ORS;  Service: Gynecology;  Laterality: N/A;   LEEP     OMENTECTOMY N/A 08/19/2019   Procedure: OMENTECTOMY;  Surgeon: Vena Austria, MD;  Location: [facility] ORS;  Service: Gynecology;  Laterality: N/A;   OOPHORECTOMY     PORTA CATH INSERTION N/A 09/11/2019   Procedure: PORTA CATH INSERTION;  Surgeon: Annice Needy, MD;  Location: [facility] IN[facility]IVE CV [facility];  Service: Cardiovascular;  Laterality: N/A;   PORTA CATH REMOVAL N/A 08/16/2022   Procedure: PORTA CATH REMOVAL;  Surgeon: Annice Needy, MD;  Location: [facility];  Service: Cardiovascular;  Laterality: N/A;   TUBAL LIGATION  1999    Social History   Tobacco Use   Smoking status: Never    Passive exposure:  Never   Smokeless tobacco: Never   Tobacco comments:    2bd hand smoke from father  Vaping Use   Vaping Use: Never used  Substance Use Topics   Alcohol use: Never    Comment: rarely   Drug use: Never     Medication list has been reviewed and updated.  Current Meds  Medication Sig   acetaminophen (TYLENOL) 500 MG tablet Take 1,000 mg by mouth every 6 (six) hours as needed (for pain.).   hydrOXYzine (ATARAX) 25 MG tablet Take 25 mg by mouth daily.   lisinopril (ZESTRIL) 5 MG tablet TAKE 1 TABLET (5 MG TOTAL) BY MOUTH DAILY.   loratadine (CLARITIN) 10 MG tablet Take 10 mg by mouth daily. otc   MYRBETRIQ 50 MG TB24 tablet TAKE 1 TABLET BY MOUTH EVERY DAY   nitrofurantoin, macrocrystal-monohydrate, (MACROBID) 100 MG capsule TAKE 1 CAPSULE BY MOUTH EVERY DAY   omeprazole (PRILOSEC) 10 MG  capsule Take 10 mg by mouth daily.   predniSONE (DELTASONE) 10 MG tablet Take 1 tablet (10 mg total) by mouth daily with breakfast.   pregabalin (LYRICA) 75 MG capsule Take 1 capsule (75 mg total) by mouth 2 (two) times daily.   Probiotic Product (UP4 PROBIOTICS WOMENS PO) Take 1 tablet by mouth daily.   triamcinolone (KENALOG) 0.025 % cream Apply topically 2 (two) times daily.   venlafaxine XR (EFFEXOR-XR) 37.5 MG 24 hr capsule Take 1 capsule (37.5 mg total) by mouth daily with breakfast.   XYZAL ALLERGY 24HR 5 MG tablet Take 10 mg by mouth daily.       11/13/2022    4:35 PM 08/27/2022    4:03 PM 06/25/2022    1:11 PM 05/04/2022    2:59 PM  GAD 7 : Generalized Anxiety Score  Nervous, Anxious, on Edge 0   0  Control/stop worrying 0   0  Worry too much - different things 0   0  Trouble relaxing 0   0  Restless 0   0  Easily annoyed or irritable 0   0  Afraid - awful might happen 0   0  Total GAD 7 Score 0   0  Anxiety Difficulty Not difficult at all   Not difficult at all     Information is confidential and restricted. Go to Review Flowsheets to unlock data.       11/13/2022    4:34 PM  08/27/2022    4:03 PM 06/25/2022    1:11 PM  Depression screen PHQ 2/9  Decreased Interest 0    Down, Depressed, Hopeless 0    PHQ - 2 Score 0    Altered sleeping 0    Tired, decreased energy 0    Change in appetite 0    Feeling bad or failure about yourself  0    Trouble concentrating 0    Moving slowly or fidgety/restless 0    Suicidal thoughts 0    PHQ-9 Score 0    Difficult doing work/chores Not difficult at all       Information is confidential and restricted. Go to Review Flowsheets to unlock data.    BP Readings from Last 3 Encounters:  11/13/22 124/72  08/22/22 (!) 146/72  08/16/22 (!) 129/59    Physical Exam Vitals and nursing note reviewed. Exam conducted with a chaperone present.  Constitutional:      General: She is not in acute distress.    Appearance: She is not diaphoretic.  HENT:     Head: Normocephalic and atraumatic.     Right Ear: Tympanic membrane and external ear normal.     Left Ear: Tympanic membrane, ear canal and external ear normal.     Nose: Nose normal. No congestion or rhinorrhea.     Mouth/Throat:     Mouth: Mucous membranes are moist.     Tongue: No lesions.     Palate: No mass and lesions.     Pharynx: Oropharynx is clear. Uvula midline. No pharyngeal swelling, oropharyngeal exudate or posterior oropharyngeal erythema.  Eyes:     General:        Right eye: No discharge.        Left eye: No discharge.     Conjunctiva/sclera: Conjunctivae normal.     Pupils: Pupils are equal, round, and reactive to light.  Neck:     Thyroid: No thyromegaly.     Vascular: No JVD.  Cardiovascular:     Rate  and Rhythm: Normal rate and regular rhythm.     Heart sounds: Normal heart sounds. No murmur heard.    No friction rub. No gallop.  Pulmonary:     Effort: Pulmonary effort is normal.     Breath sounds: Normal breath sounds. No wheezing, rhonchi or rales.  Abdominal:     General: Bowel sounds are normal.     Palpations: Abdomen is soft. There is no  mass.     Tenderness: There is no abdominal tenderness. There is no guarding.  Musculoskeletal:        General: Normal range of motion.     Cervical back: Normal range of motion and neck supple.  Lymphadenopathy:     Cervical: No cervical adenopathy.  Skin:    General: Skin is warm and dry.  Neurological:     Mental Status: She is alert.     Deep Tendon Reflexes: Reflexes are normal and symmetric.     Wt Readings from Last 3 Encounters:  11/13/22 184 lb (83.5 kg)  08/22/22 177 lb 14.4 oz (80.7 kg)  08/16/22 175 lb (79.4 kg)    BP 124/72   Pulse 85   Temp 98.1 F (36.7 C) (Oral)   Ht 5\' 2"  (1.575 m)   Wt 184 lb (83.5 kg)   SpO2 95%   BMI 33.65 kg/m   Assessment and Plan: 1. Acute non-recurrent maxillary sinusitis Acute.  Persistent.  Relatively stable but bothersome particularly at night.  Tenderness is noted over the maxillary and frontal sinuses bilateral with some submandibular tenderness but no palpable lymphadenopathy of significant degree.  Postnasal drainage is noted on throat exam this is consistent with sinusitis and will treat with azithromycin 2 tablets on day 1 then 1 tablet a day for 4 days.  Patient has been given Diflucan for follow-up yeast infection that she has had in the past. - azithromycin (ZITHROMAX) 250 MG tablet; Take 2 tablets on day 1, then 1 tablet daily on days 2 through 5  Dispense: 6 tablet; Refill: 0 - fluconazole (DIFLUCAN) 150 MG tablet; Take 1 tablet (150 mg total) by mouth once for 1 dose.  Dispense: 1 tablet; Refill: 0  2. Acute cough New onset.  Persistent.  Particular bothersome at night and we will use Robitussin AC teaspoon every 6 hours as needed for cough but it patient desires to not have codeine on board during the day team may use Tessalon Perles 100 mg instead. - guaiFENesin-codeine (ROBITUSSIN AC) 100-10 MG/5ML syrup; Take 5 mLs by mouth 3 (three) times daily as needed for cough.  Dispense: 118 mL; Refill: 0 - benzonatate (TESSALON  PERLES) 100 MG capsule; Take 1 capsule (100 mg total) by mouth 3 (three) times daily as needed for cough.  Dispense: 20 capsule; Refill: 0     Elizabeth Sauer, MD

## 2022-11-19 ENCOUNTER — Other Ambulatory Visit: Payer: Self-pay | Admitting: *Deleted

## 2022-11-19 ENCOUNTER — Inpatient Hospital Stay (HOSPITAL_BASED_OUTPATIENT_CLINIC_OR_DEPARTMENT_OTHER): Payer: Medicare Other | Admitting: Oncology

## 2022-11-19 ENCOUNTER — Encounter: Payer: Self-pay | Admitting: Oncology

## 2022-11-19 ENCOUNTER — Inpatient Hospital Stay: Payer: Medicare Other

## 2022-11-19 VITALS — BP 137/70 | HR 73 | Temp 98.7°F | Resp 18 | Ht 62.0 in | Wt 182.0 lb

## 2022-11-19 DIAGNOSIS — Z806 Family history of leukemia: Secondary | ICD-10-CM

## 2022-11-19 DIAGNOSIS — Z800 Family history of malignant neoplasm of digestive organs: Secondary | ICD-10-CM

## 2022-11-19 DIAGNOSIS — T451X5A Adverse effect of antineoplastic and immunosuppressive drugs, initial encounter: Secondary | ICD-10-CM | POA: Diagnosis not present

## 2022-11-19 DIAGNOSIS — Z90710 Acquired absence of both cervix and uterus: Secondary | ICD-10-CM | POA: Diagnosis not present

## 2022-11-19 DIAGNOSIS — C561 Malignant neoplasm of right ovary: Secondary | ICD-10-CM

## 2022-11-19 DIAGNOSIS — Z08 Encounter for follow-up examination after completed treatment for malignant neoplasm: Secondary | ICD-10-CM

## 2022-11-19 DIAGNOSIS — Z808 Family history of malignant neoplasm of other organs or systems: Secondary | ICD-10-CM

## 2022-11-19 DIAGNOSIS — D509 Iron deficiency anemia, unspecified: Secondary | ICD-10-CM | POA: Diagnosis not present

## 2022-11-19 DIAGNOSIS — E611 Iron deficiency: Secondary | ICD-10-CM

## 2022-11-19 DIAGNOSIS — G620 Drug-induced polyneuropathy: Secondary | ICD-10-CM | POA: Diagnosis not present

## 2022-11-19 DIAGNOSIS — Z803 Family history of malignant neoplasm of breast: Secondary | ICD-10-CM | POA: Diagnosis not present

## 2022-11-19 DIAGNOSIS — G629 Polyneuropathy, unspecified: Secondary | ICD-10-CM | POA: Diagnosis not present

## 2022-11-19 DIAGNOSIS — Z9221 Personal history of antineoplastic chemotherapy: Secondary | ICD-10-CM | POA: Diagnosis not present

## 2022-11-19 DIAGNOSIS — Z862 Personal history of diseases of the blood and blood-forming organs and certain disorders involving the immune mechanism: Secondary | ICD-10-CM | POA: Diagnosis not present

## 2022-11-19 DIAGNOSIS — Z8543 Personal history of malignant neoplasm of ovary: Secondary | ICD-10-CM | POA: Diagnosis not present

## 2022-11-19 LAB — CBC WITH DIFFERENTIAL/PLATELET
Abs Immature Granulocytes: 0.01 10*3/uL (ref 0.00–0.07)
Basophils Absolute: 0 10*3/uL (ref 0.0–0.1)
Basophils Relative: 1 %
Eosinophils Absolute: 0.3 10*3/uL (ref 0.0–0.5)
Eosinophils Relative: 8 %
HCT: 46 % (ref 36.0–46.0)
Hemoglobin: 15.5 g/dL — ABNORMAL HIGH (ref 12.0–15.0)
Immature Granulocytes: 0 %
Lymphocytes Relative: 48 %
Lymphs Abs: 1.8 10*3/uL (ref 0.7–4.0)
MCH: 30 pg (ref 26.0–34.0)
MCHC: 33.7 g/dL (ref 30.0–36.0)
MCV: 89.1 fL (ref 80.0–100.0)
Monocytes Absolute: 0.5 10*3/uL (ref 0.1–1.0)
Monocytes Relative: 12 %
Neutro Abs: 1.1 10*3/uL — ABNORMAL LOW (ref 1.7–7.7)
Neutrophils Relative %: 31 %
Platelets: 255 10*3/uL (ref 150–400)
RBC: 5.16 MIL/uL — ABNORMAL HIGH (ref 3.87–5.11)
RDW: 12.4 % (ref 11.5–15.5)
WBC: 3.7 10*3/uL — ABNORMAL LOW (ref 4.0–10.5)
nRBC: 0 % (ref 0.0–0.2)

## 2022-11-19 LAB — COMPREHENSIVE METABOLIC PANEL
ALT: 43 U/L (ref 0–44)
AST: 24 U/L (ref 15–41)
Albumin: 3.9 g/dL (ref 3.5–5.0)
Alkaline Phosphatase: 89 U/L (ref 38–126)
Anion gap: 7 (ref 5–15)
BUN: 9 mg/dL (ref 8–23)
CO2: 24 mmol/L (ref 22–32)
Calcium: 9.8 mg/dL (ref 8.9–10.3)
Chloride: 102 mmol/L (ref 98–111)
Creatinine, Ser: 0.76 mg/dL (ref 0.44–1.00)
GFR, Estimated: >60 mL/min (ref >60)
Glucose, Bld: 94 mg/dL (ref 70–99)
Potassium: 4.2 mmol/L (ref 3.5–5.1)
Sodium: 133 mmol/L — ABNORMAL LOW (ref 135–145)
Total Bilirubin: 0.6 mg/dL (ref 0.3–1.2)
Total Protein: 7 g/dL (ref 6.5–8.1)

## 2022-11-19 LAB — IRON AND TIBC
Iron: 81 ug/dL (ref 28–170)
Saturation Ratios: 24 % (ref 10.4–31.8)
TIBC: 337 ug/dL (ref 250–450)
UIBC: 256 ug/dL

## 2022-11-19 LAB — FERRITIN: Ferritin: 42 ng/mL (ref 11–307)

## 2022-11-19 MED ORDER — PREGABALIN 75 MG PO CAPS: 75.0000 mg | ORAL_CAPSULE | Freq: Two times a day (BID) | ORAL | 3 refills | Status: DC

## 2022-11-19 NOTE — Progress Notes (Signed)
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  This was followed by an MR pelvis with and without contrast in December 2020 which showed a large cystic mass arising from the right ovary measuring 1.8 cm.  This was followed by TAH/BSO with peritoneal biopsies with pelvic/aortic lymph node dissection pelvic washings and omentectomy with Dr. Johnnette Litter and Dr. Bonney Aid on 08/19/2019.  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  Angiolymphatic invasion is present.  Bilateral fallopian tubes were negative for atypia and malignancy.  Peritoneal stone excision negative for malignancy.  Uterus with cervix negative for atypia and malignancy.  Bladder, right gutter negative for malignancy.  5 lymph nodes were sampled and were negative for malignancy.  Pelvic washings were also negative for malignancy.  Pathologic staging FIGO 1 C2   Patient was seen by Dr. Johnnette Litter postoperatively and recommendation was to proceed with adjuvant chemotherapy carbotaxol for 6 cycles.   BRCA2 p.L2440N VUS found on the cancerNext germline panel.  The CancerNext gene panel offered by  [employer] includes sequencing and rearrangement analysis for the following 34 genes:   APC, ATM, BARD1, BMPR1A, BRCA1, BRCA2, BRIP1, CDH1, CDK4, CDKN2A, CHEK2, DICER1, HOXB13, EPCAM, GREM1, MLH1, MRE11A, MSH2, MSH6, MUTYH, NBN, NF1, PALB2, PMS2, POLD1, POLE, PTEN, RAD50, RAD51C, RAD51D, SMAD4, SMARCA4, STK11, and TP53.  The report date is 10/29/2019.   TumorHRD testing was negative.    Interval history-patient does complain of peripheral neuropathy especially in her bilateral feet at night.  She was previously taking Lyrica while she was on chemotherapy which helped back then.  Presently she is not on it.  ECOG PS- 1 Pain scale- 3 Opioid associated constipation- no  Review of systems- Review of Systems  Constitutional:  Negative for chills, fever, malaise/fatigue and weight loss.  HENT:  Negative for congestion, ear discharge and nosebleeds.   Eyes:  Negative for blurred vision.  Respiratory:  Negative for cough, hemoptysis, sputum production, shortness of breath and wheezing.   Cardiovascular:  Negative for chest pain, palpitations, orthopnea and claudication.  Gastrointestinal:  Negative for abdominal pain, blood in stool, constipation, diarrhea, heartburn, melena, nausea and vomiting.  Genitourinary:  Negative for dysuria, flank pain, frequency, hematuria and urgency.  Musculoskeletal:  Negative for back pain, joint pain and myalgias.  Skin:  Negative for rash.  Neurological:  Negative for dizziness, tingling, focal weakness, seizures, weakness and headaches.  Endo/Heme/Allergies:  Does not bruise/bleed easily.  Psychiatric/Behavioral:  Negative for depression and suicidal ideas. The patient does not have insomnia.       Allergies  Allergen Reactions   Citalopram Nausea And Vomiting   Ivp Dye [Iodinated Contrast Media] Hives and Swelling    hives   Meloxicam     unknown   Adhesive [Tape] Rash  Amlodipine Rash   Ampicillin Rash    Did it involve swelling of the  face/tongue/throat, SOB, or low BP? No Did it involve sudden or severe rash/hives, skin peeling, or any reaction on the inside of your mouth or nose? Yes Did you need to seek medical attention at a hospital or doctor's office? Yes When did it last happen?       10 + years If all above answers are "NO", may proceed with cephalosporin use.   Cefoxitin Rash   Chlorhexidine Rash    Chloraprep Scrub Teal   Estradiol Rash   Estropipate Rash   Olmesartan Medoxomil-Hctz Rash   Sertraline Rash        Sulfa Antibiotics Rash     Past Medical History:  Diagnosis Date   Anxiety    Arthritis    Diverticulitis    Family history of breast cancer    Family history of leukemia    GERD (gastroesophageal reflux disease)    History of ischemic colitis 09/02/2017   Hx of colonic polyp    Hypertension    H/O-PT TAKEN OFF BY PCP AS OF 2019   IBS (irritable bowel syndrome)    Ovarian cancer (HCC)    Ovarian cyst    Personal history of chemotherapy 2021   6 treatments   Vaginal atrophy      Past Surgical History:  Procedure Laterality Date   ABDOMINAL HYSTERECTOMY     BREAST BIOPSY Left    needle bx/clip-neg   BREAST BIOPSY Right 06/28/2021   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   CERVICAL BIOPSY  W/ LOOP ELECTRODE EXCISION     CHOLECYSTECTOMY     Colonoscopoy  2007, 2008, 2011   COLONOSCOPY WITH PROPOFOL N/A 05/05/2018   Procedure: COLONOSCOPY WITH PROPOFOL;  Surgeon: Toney Reil, MD;  Location: [facility];  Service: Gastroenterology;  Laterality: N/A;   FLEXIBLE SIGMOIDOSCOPY N/A 07/20/2021   Procedure: FLEXIBLE SIGMOIDOSCOPY;  Surgeon: Regis Bill, MD;  Location: [facility];  Service: Endoscopy;  Laterality: N/A;   HYSTERECTOMY ABDOMINAL WITH SALPINGO-OOPHORECTOMY N/A 08/19/2019   Procedure: HYSTERECTOMY ABDOMINAL WITH SALPINGO-OOPHORECTOMY WITH PELVIC WASHINGS PERITONEAL BIOPSIES AND PARA AORTIC LYMPH NODE DISSECTION;  Surgeon: Vena Austria,  MD;  Location: [facility] OR[facility];  Service: Gynecology;  Laterality: N/A;   LEEP     OMENTECTOMY N/A 08/19/2019   Procedure: OMENTECTOMY;  Surgeon: Vena Austria, MD;  Location: [facility];  Service: Gynecology;  Laterality: N/A;   OOPHORECTOMY     PORTA CATH INSERTION N/A 09/11/2019   Procedure: PORTA CATH INSERTION;  Surgeon: Annice Needy, MD;  Location: [facility];  Service: Cardiovascular;  Laterality: N/A;   PORTA CATH REMOVAL N/A 08/16/2022   Procedure: PORTA CATH REMOVAL;  Surgeon: Annice Needy, MD;  Location: [facility];  Service: Cardiovascular;  Laterality: N/A;   TUBAL LIGATION  1999    Social History   Socioeconomic History   Marital status: Single    Spouse name: Not on file   Number of children: 0   Years of education: Not on file   Highest education level: Associate degree: academic program  Occupational History   Occupation: Retired  Tobacco Use   Smoking status: Never    Passive exposure: Never   Smokeless tobacco: Never   Tobacco comments:    2bd hand smoke from father  Vaping Use   Vaping Use: Never used  Substance and Sexual Activity   Alcohol use: Never    Comment:  rarely   Drug use: Never   Sexual activity: Not Currently    Birth control/protection: Post-menopausal  Other Topics Concern   Not on file  Social History Narrative   Not on file   Social Determinants of Health   Financial Resource Strain: Low Risk  (04/26/2022)   Overall Financial Resource Strain (CARDIA)    Difficulty of Paying Living Expenses: Not hard at all  Food Insecurity: No Food Insecurity (04/26/2022)   Hunger Vital Sign    Worried About Running Out of Food in the Last Year: Never true    Ran Out of Food in the Last Year: Never true  Transportation Needs: No Transportation Needs (04/26/2022)   PRAPARE - Administrator, Civil Service (Medical): No    Lack of Transportation (Non-Medical): No  Physical Activity: Inactive (04/26/2022)   Exercise Vital Sign     Days of Exercise per Week: 0 days    Minutes of Exercise per Session: 0 min  Stress: Stress Concern Present (04/26/2022)   [employer] of Occupational Health - Occupational Stress Questionnaire    Feeling of Stress : To some extent  Social Connections: Socially Isolated (04/26/2022)   Social Connection and Isolation Panel [NHANES]    Frequency of Communication with Friends and Family: More than three times a week    Frequency of Social Gatherings with Friends and Family: Once a week    Attends Religious Services: Never    Database administrator or Organizations: No    Attends Banker Meetings: Never    Marital Status: Never married  Intimate Partner Violence: Not At Risk (04/26/2022)   Humiliation, Afraid, Rape, and Kick questionnaire    Fear of Current or Ex-Partner: No    Emotionally Abused: No    Physically Abused: No    Sexually Abused: No    Family History  Problem Relation Age of Onset   Heart failure Mother    Atrial fibrillation Mother    Hypertension Mother    Diabetes Father    Heart attack Father    Breast cancer Maternal Aunt 78   Breast cancer Maternal Grandmother 63   Breast cancer Cousin        2 mat cousins   Hypertension Sister    Diabetes Sister    Blindness Sister    Hypertension Sister    Arthritis Sister    Leukemia Paternal Aunt    Leukemia Paternal Uncle    Brain cancer Paternal Aunt    Other Brother 13       Drowning accident   Liver cancer Maternal Uncle    Alcohol abuse Maternal Uncle    Prostate cancer Neg Hx    Kidney cancer Neg Hx    BRCA 1/2 Neg Hx      Current Outpatient Medications:    acetaminophen (TYLENOL) 500 MG tablet, Take 1,000 mg by mouth every 6 (six) hours as needed (for pain.)., Disp: , Rfl:    benzonatate (TESSALON PERLES) 100 MG capsule, Take 1 capsule (100 mg total) by mouth 3 (three) times daily as needed for cough., Disp: 20 capsule, Rfl: 0   EPINEPHrine 0.3 mg/0.3 mL IJ SOAJ injection,  SMARTSIG:Milliliter(s) IM, Disp: , Rfl:    fexofenadine-pseudoephedrine (ALLEGRA-D 24) 180-240 MG 24 hr tablet, Take 1 tablet by mouth daily., Disp: , Rfl:    fluconazole (DIFLUCAN) 150 MG tablet, Take 150 mg by mouth once., Disp: , Rfl:    guaiFENesin-codeine (ROBITUSSIN AC) 100-10 MG/5ML syrup, Take 5  mLs by mouth 3 (three) times daily as needed for cough., Disp: 118 mL, Rfl: 0   hydrOXYzine (ATARAX) 25 MG tablet, Take 25 mg by mouth daily., Disp: , Rfl:    lisinopril (ZESTRIL) 5 MG tablet, TAKE 1 TABLET (5 MG TOTAL) BY MOUTH DAILY., Disp: 90 tablet, Rfl: 1   loratadine (CLARITIN) 10 MG tablet, Take 10 mg by mouth daily. otc, Disp: , Rfl:    MYRBETRIQ 50 MG TB24 tablet, TAKE 1 TABLET BY MOUTH EVERY DAY, Disp: 30 tablet, Rfl: 11   nitrofurantoin, macrocrystal-monohydrate, (MACROBID) 100 MG capsule, TAKE 1 CAPSULE BY MOUTH EVERY DAY, Disp: 30 capsule, Rfl: 2   omeprazole (PRILOSEC) 10 MG capsule, Take 10 mg by mouth daily., Disp: , Rfl:    predniSONE (DELTASONE) 10 MG tablet, Take 1 tablet (10 mg total) by mouth daily with breakfast., Disp: 30 tablet, Rfl: 0   Probiotic Product (UP4 PROBIOTICS WOMENS PO), Take 1 tablet by mouth daily., Disp: , Rfl:    triamcinolone (KENALOG) 0.025 % cream, Apply topically 2 (two) times daily., Disp: , Rfl:    venlafaxine XR (EFFEXOR-XR) 37.5 MG 24 hr capsule, Take 1 capsule (37.5 mg total) by mouth daily with breakfast., Disp: 90 capsule, Rfl: 0   XOLAIR 150 MG/ML prefilled syringe, Inject into the skin., Disp: , Rfl:    XYZAL ALLERGY 24HR 5 MG tablet, Take 10 mg by mouth daily., Disp: , Rfl:    Polyethylene Glycol 3350 (MIRALAX PO), Take by mouth. (Patient not taking: Reported on 11/13/2022), Disp: , Rfl:    pregabalin (LYRICA) 75 MG capsule, Take 1 capsule (75 mg total) by mouth 2 (two) times daily., Disp: 60 capsule, Rfl: 3 No current facility-administered medications for this visit.  Facility-Administered Medications Ordered in Other Visits:    sodium  chloride flush (NS) 0.9 % injection 10 mL, 10 mL, Intravenous, PRN, Creig Hines, MD, 10 mL at 01/05/20 0750  Physical exam:  Vitals:   11/19/22 1404 11/19/22 1411  BP: (!) 153/83 137/70  Pulse: 74 73  Resp: 18   Temp: 98.7 F (37.1 C)   TempSrc: Tympanic   SpO2: 98%   Weight: 182 lb (82.6 kg)   Height: 5\' 2"  (1.575 m)    Physical Exam Cardiovascular:     Rate and Rhythm: Normal rate and regular rhythm.     Heart sounds: Normal heart sounds.  Pulmonary:     Effort: Pulmonary effort is normal.     Breath sounds: Normal breath sounds.  Abdominal:     General: Bowel sounds are normal. There is no distension.     Palpations: Abdomen is soft.     Tenderness: There is no abdominal tenderness.  Skin:    General: Skin is warm and dry.  Neurological:     Mental Status: She is alert and oriented to person, place, and time.         Latest Ref Rng & Units 11/19/2022    1:58 PM  CMP  Glucose 70 - 99 mg/dL 94   BUN 8 - 23 mg/dL 9   Creatinine 1.61 - 0.96 mg/dL 0.45   Sodium 409 - 811 mmol/L 133   Potassium 3.5 - 5.1 mmol/L 4.2   Chloride 98 - 111 mmol/L 102   CO2 22 - 32 mmol/L 24   Calcium 8.9 - 10.3 mg/dL 9.8   Total Protein 6.5 - 8.1 g/dL 7.0   Total Bilirubin 0.3 - 1.2 mg/dL 0.6   Alkaline Phos 38 - 126 U/L  89   AST 15 - 41 U/L 24   ALT 0 - 44 U/L 43       Latest Ref Rng & Units 11/19/2022    1:58 PM  CBC  WBC 4.0 - 10.5 K/uL 3.7   Hemoglobin 12.0 - 15.0 g/dL 16.1   Hematocrit 09.6 - 46.0 % 46.0   Platelets 150 - 400 K/uL 255      Assessment and plan- Patient is a 77 y.o. female  with high-grade serous carcinoma of the ovary FIGO stage I C2 pT1c2pN0 s/p TAH/BSO.  She completed adjuvant chemotherapy in June 2021 and this is a routine follow-up visit  Overall patient is doing well with no concerning signs and symptoms of recurrence based on today's exam.Ca1 25 from today is pending.  She is also following up with GYN oncology and Dr. Hinton Ana Liaw from GYN on a yearly  basis.  History of iron deficiency anemia: Iron studies from today are pending  Peripheral neuropathy: Likely secondary to prior chemotherapy.  I am restarting her Lyrica at this time   Visit Diagnosis 1. 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HCC)   2. Iron deficiency   3. Chemotherapy-induced peripheral neuropathy (HCC)      Dr. Owens Shark, MD, MPH [facility] at [facility] [phone] 11/19/2022 3:33 PM

## 2022-11-20 LAB — CA 125: Cancer Antigen (CA) 125: 9.7 U/mL (ref 0.0–38.1)

## 2022-11-22 ENCOUNTER — Inpatient Hospital Stay: Payer: Medicare Other | Attending: Oncology | Admitting: Licensed Clinical Social Worker

## 2022-11-22 DIAGNOSIS — C561 Malignant neoplasm of right ovary: Secondary | ICD-10-CM

## 2022-11-22 NOTE — Progress Notes (Signed)
[facility] CSW Counseling Note  Patient was referred by medical provider. Treatment type: Individual  Presenting Concerns: Patient and/or family reports the following symptoms/concerns: anxiety, stress, and adjustment concerns Duration of problem: 1.8  years; Severity of problem: moderate   Orientation:oriented to person, place, time/date, situation, day of week, month of year, and year.   Affect: Depressed Risk of harm to self or others: No plan to harm self or others  Patient and/or Family's Strengths/Protective Factors: Social connections, Social and Emotional competence, Concrete supports in place (healthy food, safe environments, etc.), Sense of purpose, Physical Health (exercise, healthy diet, medication compliance, etc.), Caregiver has knowledge of parenting & child development, and Parental ResilienceAbility for insight  Active sense of humor  Average or above average intelligence  Capable of independent living  Arboriculturist fund of knowledge  Motivation for treatment/growth  Physical Health  Religious Affiliation  Special hobby/interest  Supportive family/friends  Work skills      Goals Addressed: Patient will:  Reduce symptoms of: depression, stress, and adjustment concerns Increase knowledge and/or ability of: coping skills, healthy habits, self-management skills, and stress reduction  Increase healthy adjustment to current life circumstances, Improve medication compliance, and Begin healthy grieving over loss   Progress towards Goals: Progressing  Interventions: Interventions utilized:  CBT, Strength-based, Supportive, Family Systems, and Reframing      Assessment: Patient currently experiencing anxiety and adjustment concerns post-treatment. Patient reports she is experiencing increased symptoms in anxiety and grief in reference to her sister's death.  Patient stated she feels sad about how her sister lived her life and wishes she  could have done more. Discussed ways to continue processing her sisters death and keep communication open with other sister about what she is feeling. CSW discussed different skills to manage stressors and triggers to reduce anxiety, including communication and boundary setting.      Plan: Follow up with CSW: 2 weeks May 16th, 2024 2:00 Behavioral recommendations:  continue using skills  and communication  Referral(s): Support group(s):  N/A         [employer], LCSW

## 2022-12-05 ENCOUNTER — Other Ambulatory Visit: Payer: Self-pay | Admitting: Hematology and Oncology

## 2022-12-06 ENCOUNTER — Inpatient Hospital Stay: Payer: Medicare Other | Admitting: Licensed Clinical Social Worker

## 2022-12-06 NOTE — Progress Notes (Signed)
[facility] CSW Counseling Note  Patient was referred by medical provider. Treatment type: Individual  Presenting Concerns: Patient and/or family reports the following symptoms/concerns: anxiety, depression, and stress adjustment post-treatment Duration of problem: 1.9 years; Severity of problem: mild   Orientation:oriented to person, place, time/date, situation, day of week, month of year, and year.   Affect: Appropriate Risk of harm to self or others: No plan to harm self or others  Patient and/or Family's Strengths/Protective Factors: Social connections, Social and Emotional competence, Concrete supports in place (healthy food, safe environments, etc.), Sense of purpose, Physical Health (exercise, healthy diet, medication compliance, etc.), and Caregiver has knowledge of parenting & child developmentAbility for insight  Active sense of humor  Average or above average intelligence  Capable of independent living  Arboriculturist fund of knowledge  Motivation for treatment/growth  Physical Health  Religious Affiliation  Special hobby/interest  Supportive family/friends  Work skills      Goals Addressed: Patient will:  Reduce symptoms of: anxiety, depression, stress, and adjustment concerns Increase knowledge and/or ability of: coping skills, healthy habits, self-management skills, and stress reduction  Increase healthy adjustment to current life circumstances, Increase adequate support systems for patient/family, Begin healthy grieving over loss, and communication   Progress towards Goals: Met   Interventions: Interventions utilized:  CBT, Solution Focused, Strength-based, Supportive, and Reframing      Assessment: Patient currently experiencing anxiety and adjustment concerns post-treatment. Patient reports she is improving with grief process with her sister and has experienced a reduction of anxiety symptoms. Patient stated she feels better overall  and had noticed she is abel to manage symptoms during difficult situations and is using skills.  CSW encouraged patient to continue using skills to manage stressors and triggers to reduce anxiety, including communication and boundary setting.  Patient stated she will contact psychiatrist if she thinks she needs a referral for therapy.  .      Plan: Follow up with CSW: termination session Behavioral recommendations: ontinue using skills  and communication  Referral(s): Support group(s):  N/A         [employer], LCSW
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
## 2022-12-13 ENCOUNTER — Encounter: Payer: Self-pay | Admitting: Psychiatry

## 2022-12-13 ENCOUNTER — Ambulatory Visit (INDEPENDENT_AMBULATORY_CARE_PROVIDER_SITE_OTHER): Payer: Medicare Other | Admitting: Psychiatry

## 2022-12-13 VITALS — BP 148/85 | HR 61 | Temp 98.3°F | Ht 62.0 in | Wt 183.6 lb

## 2022-12-13 DIAGNOSIS — F411 Generalized anxiety disorder: Secondary | ICD-10-CM

## 2022-12-13 DIAGNOSIS — F330 Major depressive disorder, recurrent, mild: Secondary | ICD-10-CM

## 2022-12-13 NOTE — Patient Instructions (Signed)
Continue venlafaxine 37.5 mg daily  Continue lorazepam 0.5 mg daily as needed for anxiety  Next appointment: 7/30 at 11:30

## 2023-01-01 ENCOUNTER — Ambulatory Visit
Admission: RE | Admit: 2023-01-01 | Discharge: 2023-01-01 | Disposition: A | Discharge status: Home / Self Care | Payer: Medicare Other | Attending: Family Medicine | Admitting: Family Medicine

## 2023-01-01 ENCOUNTER — Ambulatory Visit (INDEPENDENT_AMBULATORY_CARE_PROVIDER_SITE_OTHER): Payer: Medicare Other | Admitting: Family Medicine

## 2023-01-01 ENCOUNTER — Encounter: Payer: Self-pay | Admitting: Family Medicine

## 2023-01-01 ENCOUNTER — Ambulatory Visit
Admission: RE | Admit: 2023-01-01 | Discharge: 2023-01-01 | Disposition: A | Discharge status: Home / Self Care | Payer: Medicare Other | Source: Ambulatory Visit | Attending: Family Medicine | Admitting: Family Medicine

## 2023-01-01 VITALS — BP 130/110 | HR 64 | Ht 62.0 in | Wt 184.0 lb

## 2023-01-01 DIAGNOSIS — M25562 Pain in left knee: Secondary | ICD-10-CM | POA: Diagnosis not present

## 2023-01-01 DIAGNOSIS — M1712 Unilateral primary osteoarthritis, left knee: Secondary | ICD-10-CM | POA: Insufficient documentation

## 2023-01-01 DIAGNOSIS — M25462 Effusion, left knee: Secondary | ICD-10-CM | POA: Diagnosis not present

## 2023-01-01 MED ORDER — DICLOFENAC SODIUM 75 MG PO TBEC: 75.0000 mg | DELAYED_RELEASE_TABLET | Freq: Two times a day (BID) | ORAL | 0 refills | Status: AC

## 2023-01-01 NOTE — Progress Notes (Signed)
Date:  01/01/2023   Name:  Carrie Roberson   DOB:  09-18-1945   MRN:  [id number]   Chief Complaint: Knee Pain (L) knee- started a month ago after cleaning furniture)  Knee Pain  The incident occurred more than 1 week ago (more than a month). The incident occurred at home. Injury mechanism: repetitive up and down. The pain is present in the right knee. The quality of the pain is described as aching. The pain is moderate. Pertinent negatives include no inability to bear weight, loss of motion, muscle weakness, numbness or tingling. Associated symptoms comments: Pain with stairs. Nothing aggravates the symptoms.    Lab Results  Component Value Date   NA 133 (L) 11/19/2022   K 4.2 11/19/2022   CO2 24 11/19/2022   GLUCOSE 94 11/19/2022   BUN 9 11/19/2022   CREATININE 0.76 11/19/2022   CALCIUM 9.8 11/19/2022   EGFR 90 07/28/2021   GFRNONAA >60 11/19/2022   Lab Results  Component Value Date   CHOL 188 09/02/2017   HDL 53 09/02/2017   LDLCALC 109 (H) 09/02/2017   TRIG 131 09/02/2017   CHOLHDL 3.5 09/02/2017   Lab Results  Component Value Date   TSH 1.535 07/19/2021   No results found for: "HGBA1C" Lab Results  Component Value Date   WBC 3.7 (L) 11/19/2022   HGB 15.5 (H) 11/19/2022   HCT 46.0 11/19/2022   MCV 89.1 11/19/2022   PLT 255 11/19/2022   Lab Results  Component Value Date   ALT 43 11/19/2022   AST 24 11/19/2022   ALKPHOS 89 11/19/2022   BILITOT 0.6 11/19/2022   No results found for: "25OHVITD2", "25OHVITD3", "VD25OH"   Review of Systems  Neurological:  Negative for tingling and numbness.    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 10/13/2019   Iron deficiency 10/02/2019   Anxiety associated with cancer diagnosis (HCC) 10/02/2019   Chemotherapy-induced nausea 10/02/2019   Thyroid nodule 10/02/2019   Hyponatremia 10/02/2019   B12 deficiency 09/18/2019   Family history of breast cancer    Family history of leukemia    Goals of care, counseling/discussion 09/07/2019   Ovarian mass 08/19/2019   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) 08/19/2019   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HCC)    Cyst of ovary 07/15/2019   Anxiety 09/02/2017   DDD (degenerative disc disease), 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 No  Did it involve sudden or severe rash/hives, skin peeling, or any reaction on the inside of your mouth or nose? Yes Did you need to seek medical attention at a hospital or doctor's office? Yes When did it last happen?       10 + years If all above answers are "NO", may proceed with cephalosporin use.   Cefoxitin Rash   Chlorhexidine Rash    Chloraprep Scrub Teal   Estradiol Rash   Estropipate Rash   Olmesartan Medoxomil-Hctz Rash   Sertraline Rash        Sulfa Antibiotics Rash    Past Surgical History:  Procedure Laterality Date    ABDOMINAL HYSTERECTOMY     BREAST BIOPSY Left    needle bx/clip-neg   BREAST BIOPSY Right 06/28/2021   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   CERVICAL BIOPSY  W/ LOOP ELECTRODE EXCISION     CHOLECYSTECTOMY     Colonoscopoy  2007, 2008, 2011   COLONOSCOPY WITH PROPOFOL N/A 05/05/2018   Procedure: COLONOSCOPY WITH PROPOFOL;  Surgeon: Toney Reil, MD;  Location: [facility] ENDOSCOPY;  Service: Gastroenterology;  Laterality: N/A;   FLEXIBLE SIGMOIDOSCOPY N/A 07/20/2021   Procedure: FLEXIBLE SIGMOIDOSCOPY;  Surgeon: Regis Bill, MD;  Location: [facility] ENDOSCOPY;  Service: Endoscopy;  Laterality: N/A;   HYSTERECTOMY ABDOMINAL WITH SALPINGO-OOPHORECTOMY N/A 08/19/2019   Procedure: HYSTERECTOMY ABDOMINAL WITH SALPINGO-OOPHORECTOMY WITH PELVIC WASHINGS PERITONEAL BIOPSIES AND PARA AORTIC LYMPH NODE DISSECTION;  Surgeon: Vena Austria, MD;  Location: [facility] ORS;  Service: Gynecology;  Laterality: N/A;   LEEP     OMENTECTOMY N/A 08/19/2019   Procedure: OMENTECTOMY;  Surgeon: Vena Austria, MD;  Location: [facility] ORS;  Service: Gynecology;  Laterality: N/A;   OOPHORECTOMY     PORTA CATH INSERTION N/A 09/11/2019   Procedure: PORTA CATH INSERTION;  Surgeon: Annice Needy, MD;  Location: [facility] INVASIVE CV LAB;  Service: Cardiovascular;  Laterality: N/A;   PORTA CATH REMOVAL N/A 08/16/2022   Procedure: PORTA CATH REMOVAL;  Surgeon: Annice Needy, MD;  Location: [facility] INVASIVE CV LAB;  Service: Cardiovascular;  Laterality: N/A;   TUBAL LIGATION  1999    Social History   Tobacco Use   Smoking status: Never    Passive exposure: Never   Smokeless tobacco: Never   Tobacco comments:    2bd hand smoke from father  Vaping Use   Vaping Use: Never used  Substance Use Topics   Alcohol use: Never    Comment: rarely   Drug use: Never     Medication list has been reviewed and updated.  Current Meds  Medication Sig   acetaminophen (TYLENOL) 500 MG tablet Take 1,000 mg by mouth every  6 (six) hours as needed (for pain.).   fexofenadine-pseudoephedrine (ALLEGRA-D 24) 180-240 MG 24 hr tablet Take 1 tablet by mouth daily.   hydrOXYzine (ATARAX) 25 MG tablet Take 25 mg by mouth daily.   lisinopril (ZESTRIL) 5 MG tablet TAKE 1 TABLET (5 MG TOTAL) BY MOUTH DAILY.   loratadine (CLARITIN) 10 MG tablet Take 10 mg by mouth daily. otc   MYRBETRIQ 50 MG TB24 tablet TAKE 1 TABLET BY MOUTH EVERY DAY   nitrofurantoin, macrocrystal-monohydrate, (MACROBID) 100 MG capsule TAKE 1 CAPSULE BY MOUTH EVERY DAY   omeprazole (PRILOSEC) 10 MG capsule Take 10 mg by mouth daily.   Polyethylene Glycol 3350 (MIRALAX PO) Take by mouth.   pregabalin (LYRICA) 75 MG capsule Take 1 capsule (75 mg total) by mouth 2 (two) times daily.  Probiotic Product (UP4 PROBIOTICS WOMENS PO) Take 1 tablet by mouth daily.   triamcinolone (KENALOG) 0.025 % cream Apply topically 2 (two) times daily.   venlafaxine XR (EFFEXOR-XR) 37.5 MG 24 hr capsule Take 1 capsule (37.5 mg total) by mouth daily with breakfast.   XYZAL ALLERGY 24HR 5 MG tablet Take 10 mg by mouth daily.   [DISCONTINUED] predniSONE (DELTASONE) 10 MG tablet Take 1 tablet (10 mg total) by mouth daily with breakfast.       11/13/2022    4:35 PM 08/27/2022    4:03 PM 06/25/2022    1:11 PM 05/04/2022    2:59 PM  GAD 7 : Generalized Anxiety Score  Nervous, Anxious, on Edge 0   0  Control/stop worrying 0   0  Worry too much - different things 0   0  Trouble relaxing 0   0  Restless 0   0  Easily annoyed or irritable 0   0  Afraid - awful might happen 0   0  Total GAD 7 Score 0   0  Anxiety Difficulty Not difficult at all   Not difficult at all     Information is confidential and restricted. Go to Review Flowsheets to unlock data.       11/13/2022    4:34 PM 08/27/2022    4:03 PM 06/25/2022    1:11 PM  Depression screen PHQ 2/9  Decreased Interest 0    Down, Depressed, Hopeless 0    PHQ - 2 Score 0    Altered sleeping 0    Tired, decreased energy 0     Change in appetite 0    Feeling bad or failure about yourself  0    Trouble concentrating 0    Moving slowly or fidgety/restless 0    Suicidal thoughts 0    PHQ-9 Score 0    Difficult doing work/chores Not difficult at all       Information is confidential and restricted. Go to Review Flowsheets to unlock data.    BP Readings from Last 3 Encounters:  01/01/23 (!) 130/110  11/19/22 137/70  11/13/22 124/72    Physical Exam HENT:     Right Ear: Tympanic membrane normal.     Left Ear: Tympanic membrane normal.     Mouth/Throat:     Mouth: Mucous membranes are moist.  Cardiovascular:     Rate and Rhythm: Normal rate.     Heart sounds: S1 normal and S2 normal. No murmur heard.    No friction rub. No gallop.  Pulmonary:     Effort: Pulmonary effort is normal.     Breath sounds: No wheezing, rhonchi or rales.  Musculoskeletal:     Left knee: Tenderness present over the medial joint line.     Comments: Tender pesanserine /posterior popliteal     Wt Readings from Last 3 Encounters:  01/01/23 184 lb (83.5 kg)  11/19/22 182 lb (82.6 kg)  11/13/22 184 lb (83.5 kg)    BP (!) 130/110   Pulse 64   Ht 5\' 2"  (1.575 m)   Wt 184 lb (83.5 kg)   SpO2 98%   BMI 33.65 kg/m   Assessment and Plan: 1. Primary osteoarthritis of left knee New onset.  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  There is also some tenderness to a lesser degree in the Pez anserine and popliteal but is primarily at the  joint line without effusion.  I suspect that there is some cartilaginous involvement and wearing tear of the joint itself.  We will proceed with an x-ray and make an appointment for follow-up with sports medicine after trial of Voltaren 75 mg twice a day and local ice pack twice a day as well.  Patient may use Tylenol for breakthrough pain outside of the 12-hour window. - diclofenac (VOLTAREN) 75 MG EC tablet; Take 1  tablet (75 mg total) by mouth 2 (two) times daily.  Dispense: 30 tablet; Refill: 0 - DG Knee Complete 4 Views Left; Future     Elizabeth Sauer, MD

## 2023-01-01 NOTE — Patient Instructions (Signed)
Pes Anserine Bursitis  The pes anserine is an area on the inside of your knee, just below the joint, where the muscles are attached to the bone by tendons. This area is cushioned by a fluid-filled sac (bursa). Pes anserine bursitis is a condition that happens when the bursa gets swollen and irritated and causes knee pain. What are the causes? This condition may be caused by: Making the same movement over and over. A direct hit (trauma) to the inside of the leg below the knee joint. What increases the risk? You are more likely to develop this condition if you: Are a runner. Play sports that involve a lot of running and quick side-to-side movements (cutting). Are an athlete who plays contact sports. Swim using an inward angle of the knee, such as with the breaststroke. Have tight hamstring muscles. Are a woman. Are overweight. Have flat feet. Have diabetes or osteoarthritis. What are the signs or symptoms? Symptoms of this condition include: Knee pain that gets better with rest and worse with activities like climbing stairs, walking, running, or getting in and out of a chair. Swelling. Warmth. Tenderness when pressing at the inside of the lower leg, just below the knee joint. How is this diagnosed? This condition is usually diagnosed based on: Your symptoms. Your medical history. A physical exam. During your physical exam, your health care provider will press on the tendon attachment to see if you feel pain. Your health care provider will check your hip and knee motion and strength. In rare cases, tests are used to check for swelling and fluid buildup in the bursa and to look at muscles, bones, and tendons. These tests might include: X-rays. MRI. Ultrasound. How is this treated? This condition may be treated by: Resting your knee. You may be told to raise (elevate) your knee while sitting or lying down. Avoiding activities that cause pain. Icing the inside of your knee. Applying  heat to your knee. Wearing an elastic wrap or compression knee sleeve to support your knee. Sleeping with a pillow between your knees. This will cushion your injured knee. Taking medicine to reduce pain and swelling. Getting corticosteroid injections into the knee to reduce pain and swelling. Doing strengthening and stretching exercises (physical therapy). If these treatments do not work or if the condition keeps coming back, you may need to have surgery to remove the bursa. Follow these instructions at home: If you have a removable compression wrap or sleeve: Wear it as told by your health care provider. Remove it only as told by your health care provider. Loosen the wrap or sleeve if your foot or toes tingle, become numb, or turn cold and blue. Keep the wrap or sleeve clean. If the wrap or sleeve is not waterproof: Remove it if allowed by your health care provider. Do not let it get wet. Cover it with a watertight covering when you take a bath or shower if you must wear it. Managing pain, stiffness, and swelling     If directed, put ice on the injured area. Put ice in a plastic bag. Place a towel between your skin and the bag. Leave the ice on for 20 minutes, 2-3 times a day. Remove the ice if your skin turns bright red. This is very important. If you cannot feel pain, heat, or cold, you have a greater risk of damage to the area. Move your toes often to reduce stiffness and swelling. Elevate the injured area above the level of your heart while you  are sitting or lying down. If directed, apply heat to the affected area. Use the heat source that your health care provider recommends, such as a moist heat pack or a heating pad. Place a towel between your skin and the heat source. Leave the heat on for 20-30 minutes. Remove the heat if your skin turns bright red. This is especially important if you are unable to feel pain, heat, or cold. You may have a greater risk of getting  burned. Activity Return to your normal activities as told by your health care provider. Ask your health care provider what activities are safe for you. Do exercises as told by your health care provider and physical therapist. General instructions Take over-the-counter and prescription medicines only as told by your health care provider. Sleep with a pillow between your knees. Do not use any products that contain nicotine or tobacco. These products include cigarettes, chewing tobacco, and vaping devices, such as e-cigarettes. These can delay healing. If you need help quitting, ask your health care provider. If you are overweight, work with your health care provider and a dietitian to set a weight-loss goal that is healthy and reasonable for you. Keep all follow-up visits. This is important. How is this prevented? Warm up and stretch before being active. Cool down and stretch after being active. Give your body time to rest between periods of activity. Use equipment that fits you. Be safe and responsible while being active to avoid falls. Maintain a healthy weight. Maintain physical fitness, including: Strength. Flexibility. Cardiovascular fitness. Endurance. Contact a health care provider if: Your symptoms do not improve. Your symptoms get worse. Summary Pes anserine bursitis is a condition that happens when the fluid-filled sac (bursa) at the inside of your knee gets swollen, irritated, and causes pain. Treatment for pes anserine bursitis may include resting your knee, icing the inside of your knee, sleeping with a pillow between your knees, taking medicine by mouth or by injection, and doing strengthening and stretching exercises (physical therapy). Follow instructions for managing pain, stiffness, and swelling. Take over-the-counter and prescription medicines only as told by your health care provider. This information is not intended to replace advice given to you by your health care  provider. Make sure you discuss any questions you have with your health care provider. Document Revised: 07/04/2021 Document Reviewed: 07/04/2021 Elsevier Patient Education  2024 [employer].

## 2023-01-11 ENCOUNTER — Encounter: Payer: Self-pay | Admitting: Family Medicine

## 2023-01-11 ENCOUNTER — Ambulatory Visit (INDEPENDENT_AMBULATORY_CARE_PROVIDER_SITE_OTHER): Payer: Medicare Other | Admitting: Family Medicine

## 2023-01-11 VITALS — BP 130/98 | HR 78 | Ht 62.0 in | Wt 184.0 lb

## 2023-01-11 DIAGNOSIS — M1712 Unilateral primary osteoarthritis, left knee: Secondary | ICD-10-CM

## 2023-01-11 NOTE — Assessment & Plan Note (Signed)
Patient presents with atraumatic left knee pain ongoing for greater than 1 month, increased physical activity at onset (working outdoors), dealt with this pain on her own but when this failed to adequately improve, sought evaluation with her PCP Dr. Yetta Barre.  Has been started on oral and topical diclofenac with improvement, continues to use daily.  Denies any prior issues with her knee.  Examination with minimal tenderness at the medial joint line, she is tender along the pes anserine bursa and tendons, patellofemoral crepitus and pain with flexion past 90 degrees.  Exam otherwise nonfocal.  Plan as follows: - Gradually wean from medications (start with oral diclofenac with reserved usage for pain that persist despite topical use) - Start home exercises with information provided - Contact our office for any persistent or recurrent symptoms to discuss next steps - Can consider corticosteroid injection, viscosupplementation, bracing, formal PT as options for recalcitrant symptoms

## 2023-01-11 NOTE — Patient Instructions (Signed)
-   Gradually wean from medications (start with oral diclofenac with reserved usage for pain that persist despite topical use) - Start home exercises with information provided - Contact our office for any persistent or recurrent symptoms to discuss next steps

## 2023-01-11 NOTE — Progress Notes (Signed)
     Primary Care / Sports Medicine Office Visit  Patient Information:  Patient ID: Carrie Roberson, female DOB: 05/01/1946 Age: 77 y.o. MRN: [id number]   [name] is a pleasant 77 y.o. female presenting with the following:  Chief Complaint  Patient presents with   Knee Pain    Diclofenac is helping oral and topical     Vitals:   01/11/23 1419  BP: (!) 130/98  Pulse: 78  SpO2: 96%   Vitals:   01/11/23 1419  Weight: 184 lb (83.5 kg)  Height: 5\' 2"  (1.575 m)   Body mass index is 33.65 kg/m.     Independent interpretation of notes and tests performed by another provider:   Independent interpretation of left knee x-rays dated 01/01/2023 demonstrate mild-moderate medial tibiofemoral narrowing, subtle superior and inferior patellar osteophyte on lateral view, small suprapatellar effusion, no acute osseous processes identified  Procedures performed:   None  Pertinent History, Exam, Impression, and Recommendations:   Carrie Roberson was seen today for knee pain.  Primary osteoarthritis of left knee Assessment & Plan: Patient presents with atraumatic left knee pain ongoing for greater than 1 month, increased physical activity at onset (working outdoors), dealt with this pain on her own but when this failed to adequately improve, sought evaluation with her PCP Dr. Yetta Barre.  Has been started on oral and topical diclofenac with improvement, continues to use daily.  Denies any prior issues with her knee.  Examination with minimal tenderness at the medial joint line, she is tender along the pes anserine bursa and tendons, patellofemoral crepitus and pain with flexion past 90 degrees.  Exam otherwise nonfocal.  Plan as follows: - Gradually wean from medications (start with oral diclofenac with reserved usage for pain that persist despite topical use) - Start home exercises with information provided - Contact our office for any persistent or recurrent symptoms to discuss next steps - Can consider  corticosteroid injection, viscosupplementation, bracing, formal PT as options for recalcitrant symptoms      Orders & Medications No orders of the defined types were placed in this encounter.  No orders of the defined types were placed in this encounter.    No follow-ups on file.     Jerrol Banana, MD, [facility]   Primary Care Sports Medicine Primary Care and Sports Medicine at [facility]

## 2023-01-17 ENCOUNTER — Other Ambulatory Visit: Payer: Self-pay | Admitting: Family Medicine

## 2023-01-17 DIAGNOSIS — I10 Essential (primary) hypertension: Secondary | ICD-10-CM

## 2023-01-17 NOTE — Telephone Encounter (Signed)
Requested Prescriptions  Pending Prescriptions Disp Refills   lisinopril (ZESTRIL) 5 MG tablet [Pharmacy Med Name: LISINOPRIL 5 MG TABLET] 90 tablet 0    Sig: TAKE 1 TABLET (5 MG TOTAL) BY MOUTH DAILY.     Cardiovascular:  ACE Inhibitors Failed - 01/17/2023  2:40 AM      Failed - Last BP in normal range    BP Readings from Last 1 Encounters:  01/11/23 (!) 130/98         Passed - Cr in normal range and within 180 days    Creatinine, Ser  Date Value Ref Range Status  11/19/2022 0.76 0.44 - 1.00 mg/dL Final         Passed - K in normal range and within 180 days    Potassium  Date Value Ref Range Status  11/19/2022 4.2 3.5 - 5.1 mmol/L Final         Passed - Patient is not pregnant      Passed - Valid encounter within last 6 months    Recent Outpatient Visits           6 days ago Primary osteoarthritis of left knee   [place] [facility], Ocie Bob, MD   2 weeks ago Primary osteoarthritis of left knee   [facility] Phineas Inches, MD   2 months ago Acute non-recurrent maxillary sinusitis   [place] [facility] Phineas Inches, MD   8 months ago Chronic idiopathic urticaria   [place] [facility] Phineas Inches, MD   1 year ago Cystitis   [facility] Phineas Inches, MD       Future Appointments             In 5 months Janalee Dane [facility] [place]

## 2023-01-26 ENCOUNTER — Other Ambulatory Visit: Payer: Self-pay | Admitting: Physician Assistant

## 2023-01-26 DIAGNOSIS — N390 Urinary tract infection, site not specified: Secondary | ICD-10-CM

## 2023-01-30 ENCOUNTER — Other Ambulatory Visit: Payer: Self-pay | Admitting: Psychiatry

## 2023-02-15 NOTE — Progress Notes (Addendum)
BH MD/PA/NP OP Progress Note  02/19/2023 12:02 PM Carrie Roberson  MRN:  [id number]  Chief Complaint:  Chief Complaint  Patient presents with   Follow-up   HPI:  This is a follow-up appointment for depression, anxiety.  She states that she has very low energy. She is unsure if she feels depressed. She does not have any motivation to do anything except that she did sewing yesterday.  She tends to sit in the couch. Refrigerator is her friend, referring to her eating more, although she denies binge eating.  Although she was informed by her therapist that she may have unresolved issues with grief, she has been feeling more comfortable with this.  She finds it helpful to communicate with her deceased sister's family. She reports fair relationship with her sister at home. She enjoys taking care of puppy, and seeing her sister's grandchild. She denies SI. She denies any anxiety, and has not taken lorazepam.  She is not concerned about her cancer as much compared to before. She denies alcohol use, drug use. She ran out venlafaxine a few days ago, but has been experiencing the above symptoms for the past month.   Wt Readings from Last 3 Encounters:  02/19/23 187 lb 3.2 oz (84.9 kg)  01/11/23 184 lb (83.5 kg)  01/01/23 184 lb (83.5 kg)     Visit Diagnosis:    ICD-10-CM   1. MDD (major depressive disorder), recurrent episode, mild (HCC)  F33.0 TSH    2. Generalized anxiety disorder  F41.1       Past Psychiatric History: Please see initial evaluation for full details. I have reviewed the history. No updates at this time.     Past Medical History:  Past Medical History:  Diagnosis Date   Anxiety    Arthritis    Diverticulitis    Family history of breast cancer    Family history of leukemia    GERD (gastroesophageal reflux disease)    History of ischemic colitis 09/02/2017   Hx of colonic polyp    Hypertension    H/O-PT TAKEN OFF BY PCP AS OF 2019   IBS (irritable bowel syndrome)    Ovarian  cancer (HCC)    Ovarian cyst    Personal history of chemotherapy 2021   6 treatments   Vaginal atrophy     Past Surgical History:  Procedure Laterality Date   ABDOMINAL HYSTERECTOMY     BREAST BIOPSY Left    needle bx/clip-neg   BREAST BIOPSY Right 06/28/2021   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   CERVICAL BIOPSY  W/ LOOP ELECTRODE EXCISION     CHOLECYSTECTOMY     Colonoscopoy  2007, 2008, 2011   COLONOSCOPY WITH PROPOFOL N/A 05/05/2018   Procedure: COLONOSCOPY WITH PROPOFOL;  Surgeon: Toney Reil, MD;  Location: [facility];  Service: Gastroenterology;  Laterality: N/A;   FLEXIBLE SIGMOIDOSCOPY N/A 07/20/2021   Procedure: FLEXIBLE SIGMOIDOSCOPY;  Surgeon: Regis Bill, MD;  Location: [facility];  Service: Endoscopy;  Laterality: N/A;   HYSTERECTOMY ABDOMINAL WITH SALPINGO-OOPHORECTOMY N/A 08/19/2019   Procedure: HYSTERECTOMY ABDOMINAL WITH SALPINGO-OOPHORECTOMY WITH PELVIC WASHINGS PERITONEAL BIOPSIES AND PARA AORTIC LYMPH NODE DISSECTION;  Surgeon: Vena Austria, MD;  Location: [facility];  Service: Gynecology;  Laterality: N/A;   LEEP     OMENTECTOMY N/A 08/19/2019   Procedure: OMENTECTOMY;  Surgeon: Vena Austria, MD;  Location: [facility];  Service: Gynecology;  Laterality: N/A;   OOPHORECTOMY     PORTA CATH INSERTION N/A 09/11/2019  Procedure: PORTA CATH INSERTION;  Surgeon: Annice Needy, MD;  Location: [facility];  Service: Cardiovascular;  Laterality: N/A;   PORTA CATH REMOVAL N/A 08/16/2022   Procedure: PORTA CATH REMOVAL;  Surgeon: Annice Needy, MD;  Location: [facility];  Service: Cardiovascular;  Laterality: N/A;   TUBAL LIGATION  1999    Family Psychiatric History: Please see initial evaluation for full details. I have reviewed the history. No updates at this time.     Family History:  Family History  Problem Relation Age of Onset   Heart failure Mother    Atrial fibrillation Mother    Hypertension  Mother    Diabetes Father    Heart attack Father    Breast cancer Maternal Aunt 17   Breast cancer Maternal Grandmother 63   Breast cancer Cousin        2 mat cousins   Hypertension Sister    Diabetes Sister    Blindness Sister    Hypertension Sister    Arthritis Sister    Leukemia Paternal Aunt    Leukemia Paternal Uncle    Brain cancer Paternal Aunt    Other Brother 13       Drowning accident   Liver cancer Maternal Uncle    Alcohol abuse Maternal Uncle    Prostate cancer Neg Hx    Kidney cancer Neg Hx    BRCA 1/2 Neg Hx     Social History:  Social History   Socioeconomic History   Marital status: Single    Spouse name: Not on file   Number of children: 0   Years of education: Not on file   Highest education level: Associate degree: occupational, Scientist, product/process development, or vocational program  Occupational History   Occupation: Retired  Tobacco Use   Smoking status: Never    Passive exposure: Never   Smokeless tobacco: Never   Tobacco comments:    2bd hand smoke from father  Vaping Use   Vaping status: Never Used  Substance and Sexual Activity   Alcohol use: Never    Comment: rarely   Drug use: Never   Sexual activity: Not Currently    Birth control/protection: Post-menopausal  Other Topics Concern   Not on file  Social History Narrative   Not on file   Social Determinants of Health   Financial Resource Strain: Low Risk  (12/31/2022)   Overall Financial Resource Strain (CARDIA)    Difficulty of Paying Living Expenses: Not hard at all  Food Insecurity: No Food Insecurity (12/31/2022)   Hunger Vital Sign    Worried About Running Out of Food in the Last Year: Never true    Ran Out of Food in the Last Year: Never true  Transportation Needs: No Transportation Needs (12/31/2022)   PRAPARE - Administrator, Civil Service (Medical): No    Lack of Transportation (Non-Medical): No  Physical Activity: Inactive (12/31/2022)   Exercise Vital Sign    Days of Exercise  per Week: 0 days    Minutes of Exercise per Session: 0 min  Stress: No Stress Concern Present (12/31/2022)   [employer] of Occupational Health - Occupational Stress Questionnaire    Feeling of Stress : Not at all  Social Connections: Unknown (12/31/2022)   Social Connection and Isolation Panel [NHANES]    Frequency of Communication with Friends and Family: Three times a week    Frequency of Social Gatherings with Friends and Family: Once a week  Attends Religious Services: Patient declined    Active Member of Clubs or Organizations: Patient declined    Attends Banker Meetings: Never    Marital Status: Never married    Allergies:  Allergies  Allergen Reactions   Citalopram Nausea And Vomiting   Ivp Dye [Iodinated Contrast Media] Hives and Swelling    hives   Meloxicam     unknown   Adhesive [Tape] Rash   Amlodipine Rash   Ampicillin Rash    Did it involve swelling of the face/tongue/throat, SOB, or low BP? No Did it involve sudden or severe rash/hives, skin peeling, or any reaction on the inside of your mouth or nose? Yes Did you need to seek medical attention at a hospital or doctor's office? Yes When did it last happen?       10 + years If all above answers are "NO", may proceed with cephalosporin use.   Cefoxitin Rash   Chlorhexidine Rash    Chloraprep Scrub Teal   Estradiol Rash   Estropipate Rash   Olmesartan Medoxomil-Hctz Rash   Sertraline Rash        Sulfa Antibiotics Rash    Metabolic Disorder Labs: No results found for: "HGBA1C", "MPG" No results found for: "PROLACTIN" Lab Results  Component Value Date   CHOL 188 09/02/2017   TRIG 131 09/02/2017   HDL 53 09/02/2017   CHOLHDL 3.5 09/02/2017   VLDL 26 09/02/2017   LDLCALC 109 (H) 09/02/2017   Lab Results  Component Value Date   TSH 1.535 07/19/2021   TSH 0.856 09/02/2017    Therapeutic Level Labs: No results found for: "LITHIUM" No results found for: "VALPROATE" No results  found for: "CBMZ"  Current Medications: Current Outpatient Medications  Medication Sig Dispense Refill   acetaminophen (TYLENOL) 500 MG tablet Take 1,000 mg by mouth every 6 (six) hours as needed (for pain.).     buPROPion (WELLBUTRIN XL) 150 MG 24 hr tablet Take 1 tablet (150 mg total) by mouth daily. 30 tablet 1   diclofenac (VOLTAREN) 75 MG EC tablet Take 1 tablet (75 mg total) by mouth 2 (two) times daily. 30 tablet 0   fexofenadine-pseudoephedrine (ALLEGRA-D 24) 180-240 MG 24 hr tablet Take 1 tablet by mouth daily.     hydrOXYzine (ATARAX) 25 MG tablet Take 25 mg by mouth daily.     lisinopril (ZESTRIL) 5 MG tablet TAKE 1 TABLET (5 MG TOTAL) BY MOUTH DAILY. 90 tablet 0   loratadine (CLARITIN) 10 MG tablet Take 10 mg by mouth daily. otc     omeprazole (PRILOSEC) 10 MG capsule Take 10 mg by mouth daily.     Probiotic Product (UP4 PROBIOTICS WOMENS PO) Take 1 tablet by mouth daily.     triamcinolone (KENALOG) 0.025 % cream Apply topically 2 (two) times daily.     XYZAL ALLERGY 24HR 5 MG tablet Take 10 mg by mouth daily.     EPINEPHrine 0.3 mg/0.3 mL IJ SOAJ injection SMARTSIG:Milliliter(s) IM (Patient not taking: Reported on 01/01/2023)     Polyethylene Glycol 3350 (MIRALAX PO) Take by mouth. (Patient not taking: Reported on 02/19/2023)     No current facility-administered medications for this visit.   Facility-Administered Medications Ordered in Other Visits  Medication Dose Route Frequency Provider Last Rate Last Admin   sodium chloride flush (NS) 0.9 % injection 10 mL  10 mL Intravenous PRN Creig Hines, MD   10 mL at 01/05/20 0750     Musculoskeletal: Strength & Muscle Tone:  within normal limits Gait & Station: normal Patient leans: N/A  Psychiatric Specialty Exam: Review of Systems  Psychiatric/Behavioral:  Negative for agitation, behavioral problems, confusion, decreased concentration, dysphoric mood, hallucinations, self-injury, sleep disturbance and suicidal ideas. The  patient is not nervous/anxious and is not hyperactive.   All other systems reviewed and are negative.   Blood pressure (!) 142/88, pulse 72, temperature 97.6 F (36.4 C), temperature source Temporal, height 5\' 2"  (1.575 m), weight 187 lb 3.2 oz (84.9 kg), SpO2 98%.Body mass index is 34.24 kg/m.  General Appearance: Fairly Groomed  Eye Contact:  Good  Speech:  Clear and Coherent  Volume:  Normal  Mood:   no motivation  Affect:  Appropriate, Congruent, and Full Range  Thought Process:  Coherent  Orientation:  Full (Time, Place, and Person)  Thought Content: Logical   Suicidal Thoughts:  No  Homicidal Thoughts:  No  Memory:  Immediate;   Good  Judgement:  Good  Insight:  Good  Psychomotor Activity:  Normal  Concentration:  Concentration: Good and Attention Span: Good  Recall:  Good  Fund of Knowledge: Good  Language: Good  Akathisia:  No  Handed:  Right  AIMS (if indicated): not done  Assets:  Communication Skills Desire for Improvement  ADL's:  Intact  Cognition: WNL  Sleep:  Good   Screenings: GAD-7    Flowsheet Row Office Visit from 11/13/2022 in [facility] at [facility] Office Visit from 08/27/2022 in [facility] Office Visit from 06/25/2022 in [facility] Office Visit from 05/04/2022 in [facility] at [facility] Office Visit from 03/05/2022 in [facility]  Total GAD-7 Score 0 2 1 0 0      PHQ2-9    Flowsheet Row Office Visit from 11/13/2022 in [facility] at [facility] Office Visit from 08/27/2022 in [facility] Office Visit from 06/25/2022 in [facility] Office Visit from 05/04/2022 in [facility] at [facility] Clinical Support  from 04/26/2022 in [facility] at [facility]  PHQ-2 Total Score 0 0 0 0 2  PHQ-9 Total Score 0 -- -- 0 5      Flowsheet Row Office Visit from 08/27/2022 in [facility] Office Visit from 06/25/2022 in [facility] Office Visit from 01/08/2022 in [facility]  C-SSRS RISK CATEGORY No Risk No Risk No Risk        Assessment and Plan:  Carrie Roberson is a 77 y.o. 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   1. MDD (major depressive disorder), recurrent episode, mild (HCC) 2. Generalized anxiety disorder Acute stressors include: unexpected loss of her sister in [place], who was suffering from diabetes, conflict with her niece  Other stressors include: conflict with her sister, loss of her mother, her brother age 78, history of ovarian cancer    History:    She experiences significant worsening in anhedonia, low motivation since the last visit without any new stressors.  Will switch from venlafaxine to bupropion to see if it is more effective for her condition. This  medication is chosen specifically due to her low energy levels, and it is weight-neutral. She has no known history of seizure.  Discussed potential risk of headache, palpitation and, worsening in anxiety.  Will obtain lab to rule out medical health issues contributing to her symptoms. Coached behavioral activation.     Plan Start bupropion 150 mg daily  Obtain lab- TSH at [facility] Next appointment- 9/19 at 1:30, in person Hold venlafaxine, lorazepam (was on 0.5 mg daily- she hs not used it) (Try melatonin 3 mg at night, 2 hours before going to the bed)  Past trials of medication: she does not recall- sertraline (rash), citalopram (nausea),  venlafaxine (dizziness from 75 mg)    The patient demonstrates the following risk factors for suicide: Chronic risk factors for suicide include: psychiatric disorder of depression, anxiety . Acute risk factors for suicide include: unemployment and loss (financial, interpersonal, professional). Protective factors for this patient include: positive social support, coping skills, and hope for the future. Considering these factors, the overall suicide risk at this point appears to be low. Patient is appropriate for outpatient follow up.         Collaboration of Care: Collaboration of Care: Other reviewed notes in Epic  Patient/Guardian was advised Release of Information must be obtained prior to any record release in order to collaborate their care with an outside provider. Patient/Guardian was advised if they have not already done so to contact the registration department to sign all necessary forms in order for [place] to release information regarding their care.   Consent: Patient/Guardian gives verbal consent for treatment and assignment of benefits for services provided during this visit. Patient/Guardian expressed understanding and agreed to proceed.    Neysa Hotter, MD 02/19/2023, 12:02 PM

## 2023-02-18 DIAGNOSIS — L508 Other urticaria: Secondary | ICD-10-CM | POA: Diagnosis not present

## 2023-02-19 ENCOUNTER — Encounter: Payer: Self-pay | Admitting: Psychiatry

## 2023-02-19 ENCOUNTER — Ambulatory Visit (INDEPENDENT_AMBULATORY_CARE_PROVIDER_SITE_OTHER): Payer: Medicare Other | Admitting: Psychiatry

## 2023-02-19 VITALS — BP 142/88 | HR 72 | Temp 97.6°F | Ht 62.0 in | Wt 187.2 lb

## 2023-02-19 DIAGNOSIS — F330 Major depressive disorder, recurrent, mild: Secondary | ICD-10-CM

## 2023-02-19 DIAGNOSIS — F411 Generalized anxiety disorder: Secondary | ICD-10-CM | POA: Diagnosis not present

## 2023-02-19 MED ORDER — BUPROPION HCL ER (XL) 150 MG PO TB24: 150.0000 mg | ORAL_TABLET | Freq: Every day | ORAL | 1 refills | Status: DC

## 2023-02-19 NOTE — Patient Instructions (Signed)
Start bupropion 150 mg daily  Next appointment- 9/19 at 1:30

## 2023-02-25 ENCOUNTER — Other Ambulatory Visit: Payer: Self-pay | Admitting: Psychiatry

## 2023-02-25 ENCOUNTER — Telehealth: Payer: Self-pay

## 2023-02-25 MED ORDER — FLUOXETINE HCL 10 MG PO CAPS: 10.0000 mg | ORAL_CAPSULE | Freq: Every day | ORAL | 0 refills | Status: DC

## 2023-02-25 NOTE — Telephone Encounter (Signed)
Please advise her to discontinue bupropion at this time. Ask if she is interested in trying another antidepressant once her symptoms subside. The alternative is fluoxetine, and we will plan to start at the lowest dose if she is interested.

## 2023-02-25 NOTE — Telephone Encounter (Signed)
Ordered

## 2023-02-25 NOTE — Telephone Encounter (Signed)
pt called states that she can not take the bupropion 150mg . she states she is crying all the time. pt was last seen on 7-30 next appt 9-19

## 2023-02-25 NOTE — Telephone Encounter (Signed)
pt was given the information/ directions and patient states she will try the medication and to send it to [facility] [place] pt was told to check with her pharmacy later today

## 2023-02-26 NOTE — Telephone Encounter (Signed)
had told patient yesterday to check with her pharmacy later that day. but i called and left a message that rx was sent to the pharmacy and if she had any issues to call our office back

## 2023-03-08 ENCOUNTER — Ambulatory Visit: Admission: RE | Admit: 2023-03-08 | Payer: Medicare Other | Source: Ambulatory Visit

## 2023-03-08 ENCOUNTER — Ambulatory Visit
Admission: RE | Admit: 2023-03-08 | Discharge: 2023-03-08 | Disposition: A | Discharge status: Home / Self Care | Payer: Medicare Other | Attending: Family Medicine | Admitting: Family Medicine

## 2023-03-08 ENCOUNTER — Ambulatory Visit: Payer: Medicare Other | Admitting: Family Medicine

## 2023-03-08 ENCOUNTER — Encounter: Payer: Self-pay | Admitting: Family Medicine

## 2023-03-08 VITALS — BP 120/62 | HR 68 | Ht 62.0 in | Wt 186.0 lb

## 2023-03-08 DIAGNOSIS — R053 Chronic cough: Secondary | ICD-10-CM | POA: Insufficient documentation

## 2023-03-08 DIAGNOSIS — J329 Chronic sinusitis, unspecified: Secondary | ICD-10-CM | POA: Diagnosis not present

## 2023-03-08 DIAGNOSIS — R9389 Abnormal findings on diagnostic imaging of other specified body structures: Secondary | ICD-10-CM | POA: Insufficient documentation

## 2023-03-08 DIAGNOSIS — J40 Bronchitis, not specified as acute or chronic: Secondary | ICD-10-CM

## 2023-03-08 DIAGNOSIS — S161XXA Strain of muscle, fascia and tendon at neck level, initial encounter: Secondary | ICD-10-CM

## 2023-03-08 DIAGNOSIS — F330 Major depressive disorder, recurrent, mild: Secondary | ICD-10-CM | POA: Diagnosis not present

## 2023-03-08 LAB — POC COVID19 BINAXNOW: SARS Coronavirus 2 Ag: NEGATIVE

## 2023-03-08 MED ORDER — AZITHROMYCIN 250 MG PO TABS: ORAL_TABLET | ORAL | 0 refills | Status: AC

## 2023-03-08 MED ORDER — PROMETHAZINE-DM 6.25-15 MG/5ML PO SYRP: 5.0000 mL | ORAL_SOLUTION | Freq: Four times a day (QID) | ORAL | 0 refills | Status: DC | PRN

## 2023-03-08 NOTE — Addendum Note (Signed)
Addended by: Everitt Amber on: 03/08/2023 02:19 PM   Modules accepted: Orders

## 2023-03-08 NOTE — Progress Notes (Signed)
Date:  03/08/2023   Name:  Carrie Roberson   DOB:  1946/01/14   MRN:  [id number]   Chief Complaint: Cough (Dry cough)  Cough This is a recurrent problem. The current episode started more than 1 month ago. The problem has been waxing and waning. The cough is Non-productive. Associated symptoms include ear pain, nasal congestion, postnasal drip, rhinorrhea, a sore throat and wheezing. Pertinent negatives include no chest pain, chills, ear congestion, fever, headaches, heartburn, hemoptysis, myalgias, rash, shortness of breath, sweats or weight loss. Exacerbated by: exposure last week home with animals. The treatment provided moderate relief.    Lab Results  Component Value Date   NA 133 (L) 11/19/2022   K 4.2 11/19/2022   CO2 24 11/19/2022   GLUCOSE 94 11/19/2022   BUN 9 11/19/2022   CREATININE 0.76 11/19/2022   CALCIUM 9.8 11/19/2022   EGFR 90 07/28/2021   GFRNONAA >60 11/19/2022   Lab Results  Component Value Date   CHOL 188 09/02/2017   HDL 53 09/02/2017   LDLCALC 109 (H) 09/02/2017   TRIG 131 09/02/2017   CHOLHDL 3.5 09/02/2017   Lab Results  Component Value Date   TSH 1.535 07/19/2021   No results found for: "HGBA1C" Lab Results  Component Value Date   WBC 3.7 (L) 11/19/2022   HGB 15.5 (H) 11/19/2022   HCT 46.0 11/19/2022   MCV 89.1 11/19/2022   PLT 255 11/19/2022   Lab Results  Component Value Date   ALT 43 11/19/2022   AST 24 11/19/2022   ALKPHOS 89 11/19/2022   BILITOT 0.6 11/19/2022   No results found for: "25OHVITD2", "25OHVITD3", "VD25OH"   Review of Systems  Constitutional:  Negative for chills, fever and weight loss.  HENT:  Positive for ear pain, postnasal drip, rhinorrhea and sore throat.   Respiratory:  Positive for cough and wheezing. Negative for hemoptysis and shortness of breath.   Cardiovascular:  Negative for chest pain and palpitations.  Gastrointestinal:  Negative for abdominal pain and heartburn.  Musculoskeletal:  Negative for myalgias.   Skin:  Negative for rash.  Neurological:  Negative for headaches.    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 10/13/2019   Iron deficiency 10/02/2019   Anxiety associated with cancer diagnosis (HCC) 10/02/2019   Chemotherapy-induced nausea 10/02/2019   Thyroid nodule 10/02/2019   Hyponatremia 10/02/2019   B12 deficiency 09/18/2019   Family history of breast cancer    Family history of leukemia    Goals of care, counseling/discussion 09/07/2019   Ovarian mass 08/19/2019   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) 08/19/2019   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HCC)    Cyst of ovary 07/15/2019   Anxiety 09/02/2017   DDD (degenerative disc disease), 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   FH: diabetes mellitus 09/02/2017   Obesity, Class I, 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 No Did it involve sudden or severe rash/hives, skin peeling,  or any reaction on the inside of your mouth or nose? Yes Did you need to seek medical attention at a hospital or doctor's office? Yes When did it last happen?       10 + years If all above answers are "NO", may proceed with cephalosporin use.   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   CERVICAL BIOPSY  W/ LOOP ELECTRODE EXCISION     CHOLECYSTECTOMY     Colonoscopoy  2007, 2008, 2011   COLONOSCOPY WITH PROPOFOL N/A 05/05/2018   Procedure: COLONOSCOPY WITH PROPOFOL;  Surgeon: Toney Reil, MD;  Location: [facility] ENDOSCOPY;  Service: Gastroenterology;  Laterality: N/A;   FLEXIBLE SIGMOIDOSCOPY N/A 07/20/2021   Procedure: FLEXIBLE SIGMOIDOSCOPY;  Surgeon: Regis Bill, MD;  Location: [facility] ENDOSCOPY;  Service: Endoscopy;  Laterality: N/A;   HYSTERECTOMY ABDOMINAL WITH SALPINGO-OOPHORECTOMY N/A 08/19/2019   Procedure: HYSTERECTOMY ABDOMINAL WITH SALPINGO-OOPHORECTOMY WITH PELVIC WASHINGS PERITONEAL BIOPSIES AND PARA AORTIC LYMPH NODE DISSECTION;  Surgeon: Vena Austria, MD;  Location: [facility] ORS;  Service: Gynecology;  Laterality: N/A;   LEEP     OMENTECTOMY N/A 08/19/2019   Procedure: OMENTECTOMY;  Surgeon: Vena Austria, MD;  Location: [facility] ORS;  Service: Gynecology;  Laterality: N/A;   OOPHORECTOMY     PORTA CATH INSERTION N/A 09/11/2019   Procedure: PORTA CATH INSERTION;  Surgeon: Annice Needy, MD;  Location: [facility] INVASIVE CV LAB;  Service: Cardiovascular;  Laterality: N/A;   PORTA CATH REMOVAL N/A 08/16/2022   Procedure: PORTA CATH REMOVAL;  Surgeon: Annice Needy, MD;  Location: [facility] INVASIVE CV LAB;  Service:  Cardiovascular;  Laterality: N/A;   TUBAL LIGATION  1999    Social History   Tobacco Use   Smoking status: Never    Passive exposure: Never   Smokeless tobacco: Never   Tobacco comments:    2bd hand smoke from father  Vaping Use   Vaping status: Never Used  Substance Use Topics   Alcohol use: Never    Comment: rarely   Drug use: Never     Medication list has been reviewed and updated.  Current Meds  Medication Sig   acetaminophen (TYLENOL) 500 MG tablet Take 1,000 mg by mouth every 6 (six) hours as needed (for pain.).   FLUoxetine (PROZAC) 10 MG capsule Take 1 capsule (10 mg total) by mouth daily.   hydrOXYzine (ATARAX) 25 MG tablet Take 25 mg by mouth daily.   lisinopril (ZESTRIL) 5 MG tablet TAKE 1 TABLET (5 MG TOTAL) BY MOUTH DAILY.   MYRBETRIQ 50 MG TB24 tablet Take 50 mg by mouth daily.   omeprazole (  PRILOSEC) 10 MG capsule Take 10 mg by mouth daily.   Polyethylene Glycol 3350 (MIRALAX PO) Take by mouth.   Probiotic Product (UP4 PROBIOTICS WOMENS PO) Take 1 tablet by mouth daily.   triamcinolone (KENALOG) 0.025 % cream Apply topically 2 (two) times daily.   XYZAL ALLERGY 24HR 5 MG tablet Take 10 mg by mouth daily.       03/08/2023    1:35 PM 11/13/2022    4:35 PM 08/27/2022    4:03 PM 06/25/2022    1:11 PM  GAD 7 : Generalized Anxiety Score  Nervous, Anxious, on Edge 0 0    Control/stop worrying 0 0    Worry too much - different things 0 0    Trouble relaxing 0 0    Restless 0 0    Easily annoyed or irritable 0 0    Afraid - awful might happen 0 0    Total GAD 7 Score 0 0    Anxiety Difficulty Not difficult at all Not difficult at all       Information is confidential and restricted. Go to Review Flowsheets to unlock data.       03/08/2023    1:35 PM 11/13/2022    4:34 PM 08/27/2022    4:03 PM  Depression screen PHQ 2/9  Decreased Interest 0 0   Down, Depressed, Hopeless 0 0   PHQ - 2 Score 0 0   Altered sleeping 0 0   Tired, decreased energy 0 0    Change in appetite 0 0   Feeling bad or failure about yourself  0 0   Trouble concentrating 0 0   Moving slowly or fidgety/restless 0 0   Suicidal thoughts 0 0   PHQ-9 Score 0 0   Difficult doing work/chores Not difficult at all Not difficult at all      Information is confidential and restricted. Go to Review Flowsheets to unlock data.    BP Readings from Last 3 Encounters:  03/08/23 120/62  01/11/23 (!) 130/98  01/01/23 (!) 130/110    Physical Exam Vitals and nursing note reviewed.  HENT:     Head: Normocephalic.     Right Ear: Tympanic membrane, ear canal and external ear normal.     Left Ear: Tympanic membrane, ear canal and external ear normal.     Nose: Nose normal. No congestion or rhinorrhea.     Mouth/Throat:     Mouth: Mucous membranes are moist.     Pharynx: Oropharynx is clear.  Eyes:     Pupils: Pupils are equal, round, and reactive to light.  Cardiovascular:     Rate and Rhythm: Normal rate and regular rhythm.     Heart sounds: No murmur heard.    No friction rub. No gallop.  Pulmonary:     Effort: No respiratory distress.     Breath sounds: No wheezing, rhonchi or rales.  Chest:     Chest wall: No tenderness.  Musculoskeletal:     Cervical back: Neck supple. Tenderness present. No rigidity.  Lymphadenopathy:     Cervical: No cervical adenopathy.  Neurological:     Mental Status: She is alert.     Wt Readings from Last 3 Encounters:  03/08/23 186 lb (84.4 kg)  01/11/23 184 lb (83.5 kg)  01/01/23 184 lb (83.5 kg)    BP 120/62   Pulse 68   Ht 5\' 2"  (1.575 m)   Wt 186 lb (84.4 kg)   SpO2 96%  BMI 34.02 kg/m   Assessment and Plan: 1. Refractory chronic cough Chronic.  Persistent.  Waxes and waning over the course of months.  The patient has been exposed to dust and cleaning a house that had multiple Christmas trees and and washing dishes she does not feel like that this could is contributed to the current status we will obtain a chest x-ray  because there is some abnormalities noted in review of a chest CT and 2021 tenderness of the left sternocleidomastoid muscle but there is no palpable adenopathy.  I have instructed her to discontinue her lisinopril and we will prescribe promethazine with dextromethorphan for the cough.  We will obtain chest x-ray. - DG Chest 2 View - promethazine-dextromethorphan (PROMETHAZINE-DM) 6.25-15 MG/5ML syrup; Take 5 mLs by mouth 4 (four) times daily as needed.  Dispense: 118 mL; Refill: 0  2. Strain of sternocleidomastoid muscle, initial encounter New onset with tenderness of the left sternocleidomastoid and tenderness in the insertion of it with pain on resisted rotation of the left sternocleidomastoid.  This may be irritated due to the cough I do not see any upper oral lesions or associated left ear malady.  3. Abnormal chest CT As noted above there is an abnormal chest CT that have pulmonary nodules and a nodule of the thyroid that needs to be brought to the attention of Dr. Smith Robert. o we will text message to Dr. Smith Robert to see if she is aware that this may need to be further evaluated given suggestions by the radiologist. - DG Chest 2 View  4. Chronic sinusitis with recurrent bronchitis Given the chronic nature and some postnasal drainage we will treat with azithromycin for the possibility of a residual chronic sinusitis. - azithromycin (ZITHROMAX) 250 MG tablet; Take 2 tablets on day 1, then 1 tablet daily on days 2 through 5  Dispense: 6 tablet; Refill: 0     Elizabeth Sauer, MD

## 2023-03-09 ENCOUNTER — Encounter: Payer: Self-pay | Admitting: Psychiatry

## 2023-03-11 ENCOUNTER — Other Ambulatory Visit: Payer: Self-pay

## 2023-03-11 DIAGNOSIS — B379 Candidiasis, unspecified: Secondary | ICD-10-CM

## 2023-03-11 MED ORDER — FLUCONAZOLE 150 MG PO TABS
150.0000 mg | ORAL_TABLET | Freq: Once | ORAL | 0 refills | Status: AC
Start: 2023-03-11 — End: 2023-03-11

## 2023-03-11 NOTE — Progress Notes (Signed)
Diflucan sent in

## 2023-03-14 ENCOUNTER — Other Ambulatory Visit: Payer: Self-pay | Admitting: Psychiatry

## 2023-03-21 ENCOUNTER — Ambulatory Visit (INDEPENDENT_AMBULATORY_CARE_PROVIDER_SITE_OTHER): Payer: Medicare Other | Admitting: Family Medicine

## 2023-03-21 ENCOUNTER — Encounter: Payer: Self-pay | Admitting: Family Medicine

## 2023-03-21 VITALS — BP 120/72 | HR 68 | Ht 62.0 in | Wt 189.0 lb

## 2023-03-21 DIAGNOSIS — I10 Essential (primary) hypertension: Secondary | ICD-10-CM | POA: Diagnosis not present

## 2023-03-21 DIAGNOSIS — M542 Cervicalgia: Secondary | ICD-10-CM

## 2023-03-21 NOTE — Progress Notes (Signed)
Date:  03/21/2023   Name:  Carrie Roberson   DOB:  1945/12/24   MRN:  [id number]   Chief Complaint: Hypertension (Bp recheck s/p stopping lisinopril d/t cough)  Hypertension Pertinent negatives include no chest pain, palpitations or shortness of breath.    Lab Results  Component Value Date   NA 133 (L) 11/19/2022   K 4.2 11/19/2022   CO2 24 11/19/2022   GLUCOSE 94 11/19/2022   BUN 9 11/19/2022   CREATININE 0.76 11/19/2022   CALCIUM 9.8 11/19/2022   EGFR 90 07/28/2021   GFRNONAA >60 11/19/2022   Lab Results  Component Value Date   CHOL 188 09/02/2017   HDL 53 09/02/2017   LDLCALC 109 (H) 09/02/2017   TRIG 131 09/02/2017   CHOLHDL 3.5 09/02/2017   Lab Results  Component Value Date   TSH 0.925 03/08/2023   No results found for: "HGBA1C" Lab Results  Component Value Date   WBC 3.7 (L) 11/19/2022   HGB 15.5 (H) 11/19/2022   HCT 46.0 11/19/2022   MCV 89.1 11/19/2022   PLT 255 11/19/2022   Lab Results  Component Value Date   ALT 43 11/19/2022   AST 24 11/19/2022   ALKPHOS 89 11/19/2022   BILITOT 0.6 11/19/2022   No results found for: "25OHVITD2", "25OHVITD3", "VD25OH"   Review of Systems  HENT:  Negative for trouble swallowing.   Eyes:  Negative for visual disturbance.  Respiratory:  Negative for chest tightness, shortness of breath and wheezing.   Cardiovascular:  Negative for chest pain and palpitations.  Gastrointestinal:  Negative for abdominal pain and blood in stool.  Endocrine: Negative for polydipsia and polyuria.    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 10/13/2019   Iron deficiency 10/02/2019   Anxiety associated with cancer diagnosis (HCC) 10/02/2019   Chemotherapy-induced nausea 10/02/2019   Thyroid nodule 10/02/2019   Hyponatremia 10/02/2019   B12 deficiency 09/18/2019   Family history of breast cancer    Family history of leukemia    Goals of care, counseling/discussion 09/07/2019   Ovarian mass 08/19/2019   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) 08/19/2019   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HCC)    Cyst of ovary 07/15/2019   Anxiety 09/02/2017   DDD (degenerative disc disease), 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 No Did it involve sudden or severe rash/hives, skin peeling,  or any reaction on the inside of your mouth or nose? Yes Did you need to seek medical attention at a hospital or doctor's office? Yes When did it last happen?       10 + years If all above answers are "NO", may proceed with cephalosporin use.   Cefoxitin Rash   Chlorhexidine Rash    Chloraprep Scrub Teal   Estradiol Rash   Estropipate Rash   Olmesartan Medoxomil-Hctz Rash   Sertraline Rash        Sulfa Antibiotics Rash    Past Surgical History:  Procedure Laterality Date   ABDOMINAL  HYSTERECTOMY     BREAST BIOPSY Left    needle bx/clip-neg   BREAST BIOPSY Right 06/28/2021   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   CERVICAL BIOPSY  W/ LOOP ELECTRODE EXCISION     CHOLECYSTECTOMY     Colonoscopoy  2007, 2008, 2011   COLONOSCOPY WITH PROPOFOL N/A 05/05/2018   Procedure: COLONOSCOPY WITH PROPOFOL;  Surgeon: Toney Reil, MD;  Location: [facility] ENDOSCOPY;  Service: Gastroenterology;  Laterality: N/A;   FLEXIBLE SIGMOIDOSCOPY N/A 07/20/2021   Procedure: FLEXIBLE SIGMOIDOSCOPY;  Surgeon: Regis Bill, MD;  Location: [facility] ENDOSCOPY;  Service: Endoscopy;  Laterality: N/A;   HYSTERECTOMY ABDOMINAL WITH SALPINGO-OOPHORECTOMY N/A 08/19/2019   Procedure: HYSTERECTOMY ABDOMINAL WITH SALPINGO-OOPHORECTOMY WITH PELVIC WASHINGS PERITONEAL BIOPSIES AND PARA AORTIC LYMPH NODE DISSECTION;  Surgeon: Vena Austria, MD;  Location: [facility] ORS;  Service: Gynecology;  Laterality: N/A;   LEEP     OMENTECTOMY N/A 08/19/2019   Procedure: OMENTECTOMY;  Surgeon: Vena Austria, MD;  Location: [facility] ORS;  Service: Gynecology;  Laterality: N/A;   OOPHORECTOMY     PORTA CATH INSERTION N/A 09/11/2019   Procedure: PORTA CATH INSERTION;  Surgeon: Annice Needy, MD;  Location: [facility] INVASIVE CV LAB;  Service: Cardiovascular;  Laterality: N/A;   PORTA CATH REMOVAL N/A 08/16/2022   Procedure: PORTA CATH REMOVAL;  Surgeon: Annice Needy, MD;  Location: [facility] INVASIVE CV LAB;  Service: Cardiovascular;  Laterality: N/A;   TUBAL LIGATION  1999    Social History   Tobacco Use   Smoking status: Never    Passive exposure: Never   Smokeless tobacco: Never   Tobacco comments:    2bd hand smoke from father  Vaping Use   Vaping status: Never Used  Substance Use Topics   Alcohol use: Never    Comment: rarely   Drug use: Never     Medication list has been reviewed and updated.  Current Meds  Medication Sig   acetaminophen (TYLENOL) 500 MG tablet Take 1,000 mg by mouth every 6 (six)  hours as needed (for pain.).   FLUoxetine (PROZAC) 10 MG capsule Take 1 capsule (10 mg total) by mouth daily.   hydrOXYzine (ATARAX) 25 MG tablet Take 25 mg by mouth daily.   loratadine (CLARITIN) 10 MG tablet Take 10 mg by mouth daily. otc   MYRBETRIQ 50 MG TB24 tablet Take 50 mg by mouth daily.   omeprazole (PRILOSEC) 10 MG capsule Take 10 mg by mouth daily.   Polyethylene Glycol 3350 (MIRALAX PO) Take by mouth.   Probiotic Product (UP4 PROBIOTICS WOMENS PO) Take 1 tablet by mouth daily.   triamcinolone (KENALOG) 0.025 % cream Apply topically 2 (two) times daily.   XYZAL ALLERGY 24HR 5 MG tablet Take 10 mg by mouth daily.       03/21/2023    1:50 PM 03/08/2023    1:35 PM 11/13/2022  4:35 PM 08/27/2022    4:03 PM  GAD 7 : Generalized Anxiety Score  Nervous, Anxious, on Edge 0 0 0   Control/stop worrying 0 0 0   Worry too much - different things 0 0 0   Trouble relaxing 0 0 0   Restless 0 0 0   Easily annoyed or irritable 0 0 0   Afraid - awful might happen 0 0 0   Total GAD 7 Score 0 0 0   Anxiety Difficulty Not difficult at all Not difficult at all Not difficult at all      Information is confidential and restricted. Go to Review Flowsheets to unlock data.       03/21/2023    1:50 PM 03/08/2023    1:35 PM 11/13/2022    4:34 PM  Depression screen PHQ 2/9  Decreased Interest 0 0 0  Down, Depressed, Hopeless 0 0 0  PHQ - 2 Score 0 0 0  Altered sleeping 0 0 0  Tired, decreased energy 0 0 0  Change in appetite 0 0 0  Feeling bad or failure about yourself  0 0 0  Trouble concentrating 0 0 0  Moving slowly or fidgety/restless 0 0 0  Suicidal thoughts 0 0 0  PHQ-9 Score 0 0 0  Difficult doing work/chores Not difficult at all Not difficult at all Not difficult at all    BP Readings from Last 3 Encounters:  03/21/23 120/72  03/08/23 120/62  01/11/23 (!) 130/98    Physical Exam Vitals and nursing note reviewed. Exam conducted with a chaperone present.  Constitutional:       General: She is not in acute distress.    Appearance: She is not diaphoretic.  HENT:     Head: Normocephalic and atraumatic.     Right Ear: Tympanic membrane and external ear normal.     Left Ear: Tympanic membrane and external ear normal.     Nose: Nose normal. No congestion or rhinorrhea.     Mouth/Throat:     Mouth: Mucous membranes are moist.     Pharynx: Oropharynx is clear.  Eyes:     General:        Right eye: No discharge.        Left eye: No discharge.     Conjunctiva/sclera: Conjunctivae normal.     Pupils: Pupils are equal, round, and reactive to light.  Neck:     Thyroid: No thyroid mass, thyromegaly or thyroid tenderness.     Vascular: Normal carotid pulses. No carotid bruit, hepatojugular reflux or JVD.     Trachea: Trachea and phonation normal.  Cardiovascular:     Rate and Rhythm: Normal rate and regular rhythm.     Heart sounds: Normal heart sounds. No murmur heard.    No friction rub. No gallop.  Pulmonary:     Effort: Pulmonary effort is normal.     Breath sounds: Normal breath sounds. No wheezing, rhonchi or rales.  Chest:     Chest wall: No tenderness.  Abdominal:     General: Bowel sounds are normal.     Palpations: Abdomen is soft. There is no mass.     Tenderness: There is no abdominal tenderness. There is no guarding.  Musculoskeletal:        General: Normal range of motion.     Cervical back: Normal range of motion and neck supple. No rigidity. No pain with movement or muscular tenderness. Normal range of motion.  Lymphadenopathy:  Cervical: No cervical adenopathy.     Right cervical: No superficial, deep or posterior cervical adenopathy.    Left cervical: No superficial, deep or posterior cervical adenopathy.  Skin:    General: Skin is warm and dry.  Neurological:     Mental Status: She is alert.     Cranial Nerves: No cranial nerve deficit.     Sensory: No sensory deficit.     Deep Tendon Reflexes: Reflexes are normal and symmetric.      Wt Readings from Last 3 Encounters:  03/21/23 189 lb (85.7 kg)  03/08/23 186 lb (84.4 kg)  01/11/23 184 lb (83.5 kg)    BP 120/72   Pulse 68   Ht 5\' 2"  (1.575 m)   Wt 189 lb (85.7 kg)   BMI 34.57 kg/m   Assessment and Plan: 1. Essential hypertension Chronic.  Controlled.  Stable.  Patient was having issues with cough and we discontinued lisinopril which has since has removed any sensation of cough or clearing of throat.  Recheck of blood pressure is 120/72 and this is acceptable at this time and we will continue with dietary monitoring of sodium intake and recheck within 6 months.  2. Cervical pain (neck) Patient also brings up a recurrent issue with neck discomfort when she turns her head a certain way and is concerned about an underlying insidious circumstance in the area of the pain.  On reevaluation there is no palpable mass and no noted concern.  We will refer for ultrasound of the neck to rule out any adenopathy in the general vicinity and refer to ear nose and throat for further evaluation given that this is an ongoing concern of the patient. - US Soft Tissue Head/Neck (NON-THYROID); Future - Ambulatory referral to ENT     Elizabeth Sauer, MD

## 2023-03-22 ENCOUNTER — Inpatient Hospital Stay: Payer: Medicare Other | Admitting: Oncology

## 2023-03-22 ENCOUNTER — Inpatient Hospital Stay: Payer: Medicare Other | Attending: Oncology

## 2023-03-22 ENCOUNTER — Encounter: Payer: Self-pay | Admitting: Oncology

## 2023-03-22 VITALS — BP 142/69 | HR 60 | Temp 97.6°F | Resp 18 | Ht 62.0 in | Wt 187.8 lb

## 2023-03-22 DIAGNOSIS — Z808 Family history of malignant neoplasm of other organs or systems: Secondary | ICD-10-CM | POA: Diagnosis not present

## 2023-03-22 DIAGNOSIS — T451X5A Adverse effect of antineoplastic and immunosuppressive drugs, initial encounter: Secondary | ICD-10-CM

## 2023-03-22 DIAGNOSIS — E611 Iron deficiency: Secondary | ICD-10-CM

## 2023-03-22 DIAGNOSIS — G620 Drug-induced polyneuropathy: Secondary | ICD-10-CM | POA: Insufficient documentation

## 2023-03-22 DIAGNOSIS — Z90722 Acquired absence of ovaries, bilateral: Secondary | ICD-10-CM | POA: Insufficient documentation

## 2023-03-22 DIAGNOSIS — Z90710 Acquired absence of both cervix and uterus: Secondary | ICD-10-CM | POA: Insufficient documentation

## 2023-03-22 DIAGNOSIS — Z806 Family history of leukemia: Secondary | ICD-10-CM | POA: Insufficient documentation

## 2023-03-22 DIAGNOSIS — C561 Malignant neoplasm of right ovary: Secondary | ICD-10-CM

## 2023-03-22 DIAGNOSIS — Z8543 Personal history of malignant neoplasm of ovary: Secondary | ICD-10-CM

## 2023-03-22 DIAGNOSIS — Z803 Family history of malignant neoplasm of breast: Secondary | ICD-10-CM | POA: Insufficient documentation

## 2023-03-22 DIAGNOSIS — Z08 Encounter for follow-up examination after completed treatment for malignant neoplasm: Secondary | ICD-10-CM

## 2023-03-22 DIAGNOSIS — D509 Iron deficiency anemia, unspecified: Secondary | ICD-10-CM

## 2023-03-22 LAB — CBC
HCT: 43.4 % (ref 36.0–46.0)
Hemoglobin: 14.2 g/dL (ref 12.0–15.0)
MCH: 29.4 pg (ref 26.0–34.0)
MCHC: 32.7 g/dL (ref 30.0–36.0)
MCV: 89.9 fL (ref 80.0–100.0)
Platelets: 215 10*3/uL (ref 150–400)
RBC: 4.83 MIL/uL (ref 3.87–5.11)
RDW: 12.9 % (ref 11.5–15.5)
WBC: 3.8 10*3/uL — ABNORMAL LOW (ref 4.0–10.5)
nRBC: 0 % (ref 0.0–0.2)

## 2023-03-22 LAB — COMPREHENSIVE METABOLIC PANEL
ALT: 19 U/L (ref 0–44)
AST: 24 U/L (ref 15–41)
Albumin: 3.4 g/dL — ABNORMAL LOW (ref 3.5–5.0)
Alkaline Phosphatase: 64 U/L (ref 38–126)
Anion gap: 4 — ABNORMAL LOW (ref 5–15)
BUN: 12 mg/dL (ref 8–23)
CO2: 24 mmol/L (ref 22–32)
Calcium: 9.2 mg/dL (ref 8.9–10.3)
Chloride: 104 mmol/L (ref 98–111)
Creatinine, Ser: 0.73 mg/dL (ref 0.44–1.00)
GFR, Estimated: >60 mL/min (ref >60)
Glucose, Bld: 73 mg/dL (ref 70–99)
Potassium: 4.4 mmol/L (ref 3.5–5.1)
Sodium: 132 mmol/L — ABNORMAL LOW (ref 135–145)
Total Bilirubin: 0.4 mg/dL (ref 0.3–1.2)
Total Protein: 6.3 g/dL — ABNORMAL LOW (ref 6.5–8.1)

## 2023-03-22 LAB — IRON AND TIBC
Iron: 128 ug/dL (ref 28–170)
Saturation Ratios: 39 % — ABNORMAL HIGH (ref 10.4–31.8)
TIBC: 325 ug/dL (ref 250–450)
UIBC: 197 ug/dL

## 2023-03-22 LAB — FERRITIN: Ferritin: 35 ng/mL (ref 11–307)

## 2023-03-22 NOTE — Progress Notes (Signed)
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  This was followed by an MR pelvis with and without contrast in December 2020 which showed a large cystic mass arising from the right ovary measuring 1.8 cm.  This was followed by TAH/BSO with peritoneal biopsies with pelvic/aortic lymph node dissection pelvic washings and omentectomy with Dr. Johnnette Litter and Dr. Bonney Aid on 08/19/2019.  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  Angiolymphatic invasion is present.  Bilateral fallopian tubes were negative for atypia and malignancy.  Peritoneal stone excision negative for malignancy.  Uterus with cervix negative for atypia and malignancy.  Bladder, right gutter negative for malignancy.  5 lymph nodes were sampled and were negative for malignancy.  Pelvic washings were also negative for malignancy.  Pathologic staging FIGO 1 C2   Patient completed 6 cycles of adjuvant CarboTaxol chemotherapy in June 2021.   BRCA2 p.[id number] VUS found on the cancerNext germline panel.  The CancerNext gene panel offered by [employer] includes sequencing and rearrangement  analysis for the following 34 genes:   APC, ATM, BARD1, BMPR1A, BRCA1, BRCA2, BRIP1, CDH1, CDK4, CDKN2A, CHEK2, DICER1, HOXB13, EPCAM, GREM1, MLH1, MRE11A, MSH2, MSH6, MUTYH, NBN, NF1, PALB2, PMS2, POLD1, POLE, PTEN, RAD50, RAD51C, RAD51D, SMAD4, SMARCA4, STK11, and TP53.  The report date is 10/29/2019.   TumorHRD testing was negative.    Interval history-neuropathy symptoms have improved significantly after restarting Lyrica which she especially takes at night.  Appetite and weight have remained stable.  Denies any abdominal pain or distention or early satiety.  ECOG PS- 1 Pain scale- 0   Review of systems- Review of Systems  Constitutional:  Negative for chills, fever, malaise/fatigue and weight loss.  HENT:  Negative for congestion, ear discharge and nosebleeds.   Eyes:  Negative for blurred vision.  Respiratory:  Negative for cough, hemoptysis, sputum production, shortness of breath and wheezing.   Cardiovascular:  Negative for chest pain, palpitations, orthopnea and claudication.  Gastrointestinal:  Negative for abdominal pain, blood in stool, constipation, diarrhea, heartburn, melena, nausea and vomiting.  Genitourinary:  Negative for dysuria, flank pain, frequency, hematuria and urgency.  Musculoskeletal:  Negative for back pain, joint pain and myalgias.  Skin:  Negative for rash.  Neurological:  Negative for dizziness, tingling, focal weakness, seizures, weakness and headaches.  Endo/Heme/Allergies:  Does not bruise/bleed easily.  Psychiatric/Behavioral:  Negative for depression and suicidal ideas. The patient does not have insomnia.       Allergies  Allergen Reactions   Citalopram Nausea And Vomiting   Ivp Dye [Iodinated Contrast Media] Hives and Swelling    hives   Meloxicam     unknown   Adhesive [Tape] Rash   Amlodipine Rash   Ampicillin Rash    Did it involve swelling  of the face/tongue/throat, SOB, or low BP? No Did it involve sudden or severe rash/hives, skin  peeling, or any reaction on the inside of your mouth or nose? Yes Did you need to seek medical attention at a hospital or doctor's office? Yes When did it last happen?       10 + years If all above answers are "NO", may proceed with cephalosporin use.   Cefoxitin Rash   Chlorhexidine Rash    Chloraprep Scrub Teal   Estradiol Rash   Estropipate Rash   Olmesartan Medoxomil-Hctz Rash   Sertraline Rash        Sulfa Antibiotics Rash     Past Medical History:  Diagnosis Date   Anxiety    Arthritis    Diverticulitis    Family history of breast cancer    Family history of leukemia    GERD (gastroesophageal reflux disease)    History of ischemic colitis 09/02/2017   Hx of colonic polyp    Hypertension    H/O-PT TAKEN OFF BY PCP AS OF 2019   IBS (irritable bowel syndrome)    Ovarian cancer (HCC)    Ovarian cyst    Personal history of chemotherapy 2021   6 treatments   Vaginal atrophy      Past Surgical History:  Procedure Laterality Date   ABDOMINAL HYSTERECTOMY     BREAST BIOPSY Left    needle bx/clip-neg   BREAST BIOPSY Right 06/28/2021   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   CERVICAL BIOPSY  W/ LOOP ELECTRODE EXCISION     CHOLECYSTECTOMY     Colonoscopoy  2007, 2008, 2011   COLONOSCOPY WITH PROPOFOL N/A 05/05/2018   Procedure: COLONOSCOPY WITH PROPOFOL;  Surgeon: Toney Reil, MD;  Location: [facility];  Service: Gastroenterology;  Laterality: N/A;   FLEXIBLE SIGMOIDOSCOPY N/A 07/20/2021   Procedure: FLEXIBLE SIGMOIDOSCOPY;  Surgeon: Regis Bill, MD;  Location: [facility];  Service: Endoscopy;  Laterality: N/A;   HYSTERECTOMY ABDOMINAL WITH SALPINGO-OOPHORECTOMY N/A 08/19/2019   Procedure: HYSTERECTOMY ABDOMINAL WITH SALPINGO-OOPHORECTOMY WITH PELVIC WASHINGS PERITONEAL BIOPSIES AND PARA AORTIC LYMPH NODE DISSECTION;  Surgeon: Vena Austria, MD;  Location: [facility] ORS;  Service: Gynecology;  Laterality: N/A;   LEEP     OMENTECTOMY  N/A 08/19/2019   Procedure: OMENTECTOMY;  Surgeon: Vena Austria, MD;  Location: [facility];  Service: Gynecology;  Laterality: N/A;   OOPHORECTOMY     PORTA CATH INSERTION N/A 09/11/2019   Procedure: PORTA CATH INSERTION;  Surgeon: Annice Needy, MD;  Location: [facility];  Service: Cardiovascular;  Laterality: N/A;   PORTA CATH REMOVAL N/A 08/16/2022   Procedure: PORTA CATH REMOVAL;  Surgeon: Annice Needy, MD;  Location: [facility];  Service: Cardiovascular;  Laterality: N/A;   TUBAL LIGATION  1999    Social History   Socioeconomic History   Marital status: Single    Spouse name: Not on file   Number of children: 0   Years of education: Not on file   Highest education level: Associate degree: occupational, Scientist, product/process development, or vocational program  Occupational History   Occupation: Retired  Tobacco Use   Smoking status: Never    Passive exposure: Never   Smokeless tobacco: Never   Tobacco comments:    2bd hand smoke from father  Vaping Use   Vaping status: Never Used  Substance and Sexual Activity   Alcohol use: Never    Comment: rarely   Drug use: Never   Sexual activity:  Not Currently    Birth control/protection: Post-menopausal  Other Topics Concern   Not on file  Social History Narrative   Not on file   Social Determinants of Health   Financial Resource Strain: Low Risk  (12/31/2022)   Overall Financial Resource Strain (CARDIA)    Difficulty of Paying Living Expenses: Not hard at all  Food Insecurity: No Food Insecurity (12/31/2022)   Hunger Vital Sign    Worried About Running Out of Food in the Last Year: Never true    Ran Out of Food in the Last Year: Never true  Transportation Needs: No Transportation Needs (12/31/2022)   PRAPARE - Administrator, Civil Service (Medical): No    Lack of Transportation (Non-Medical): No  Physical Activity: Inactive (12/31/2022)   Exercise Vital Sign    Days of Exercise per Week: 0 days    Minutes of  Exercise per Session: 0 min  Stress: No Stress Concern Present (12/31/2022)   [employer] of Occupational Health - Occupational Stress Questionnaire    Feeling of Stress : Not at all  Social Connections: Unknown (12/31/2022)   Social Connection and Isolation Panel [NHANES]    Frequency of Communication with Friends and Family: Three times a week    Frequency of Social Gatherings with Friends and Family: Once a week    Attends Religious Services: Patient declined    Database administrator or Organizations: Patient declined    Attends Banker Meetings: Never    Marital Status: Never married  Intimate Partner Violence: Not At Risk (04/26/2022)   Humiliation, Afraid, Rape, and Kick questionnaire    Fear of Current or Ex-Partner: No    Emotionally Abused: No    Physically Abused: No    Sexually Abused: No    Family History  Problem Relation Age of Onset   Heart failure Mother    Atrial fibrillation Mother    Hypertension Mother    Diabetes Father    Heart attack Father    Breast cancer Maternal Aunt 60   Breast cancer Maternal Grandmother 12   Breast cancer Cousin        2 mat cousins   Hypertension Sister    Diabetes Sister    Blindness Sister    Hypertension Sister    Arthritis Sister    Leukemia Paternal Aunt    Leukemia Paternal Uncle    Brain cancer Paternal Aunt    Other Brother 13       Drowning accident   Liver cancer Maternal Uncle    Alcohol abuse Maternal Uncle    Prostate cancer Neg Hx    Kidney cancer Neg Hx    BRCA 1/2 Neg Hx      Current Outpatient Medications:    acetaminophen (TYLENOL) 500 MG tablet, Take 1,000 mg by mouth every 6 (six) hours as needed (for pain.)., Disp: , Rfl:    fexofenadine-pseudoephedrine (ALLEGRA-D 24) 180-240 MG 24 hr tablet, Take 1 tablet by mouth daily., Disp: , Rfl:    hydrOXYzine (ATARAX) 25 MG tablet, Take 25 mg by mouth daily., Disp: , Rfl:    MYRBETRIQ 50 MG TB24 tablet, Take 50 mg by mouth daily., Disp: ,  Rfl:    omeprazole (PRILOSEC) 10 MG capsule, Take 10 mg by mouth daily., Disp: , Rfl:    Polyethylene Glycol 3350 (MIRALAX PO), Take by mouth., Disp: , Rfl:    Probiotic Product (UP4 PROBIOTICS WOMENS PO), Take 1 tablet by mouth daily., Disp: ,  Rfl:    triamcinolone (KENALOG) 0.025 % cream, Apply topically 2 (two) times daily., Disp: , Rfl:    XYZAL ALLERGY 24HR 5 MG tablet, Take 10 mg by mouth daily., Disp: , Rfl:    diclofenac (VOLTAREN) 75 MG EC tablet, Take 1 tablet (75 mg total) by mouth 2 (two) times daily. (Patient not taking: Reported on 03/08/2023), Disp: 30 tablet, Rfl: 0   FLUoxetine (PROZAC) 10 MG capsule, Take 1 capsule (10 mg total) by mouth daily. (Patient not taking: Reported on 03/22/2023), Disp: 30 capsule, Rfl: 0   loratadine (CLARITIN) 10 MG tablet, Take 10 mg by mouth daily. otc (Patient not taking: Reported on 03/22/2023), Disp: , Rfl:  No current facility-administered medications for this visit.  Facility-Administered Medications Ordered in Other Visits:    sodium chloride flush (NS) 0.9 % injection 10 mL, 10 mL, Intravenous, PRN, Creig Hines, MD, 10 mL at 01/05/20 0750  Physical exam:  Vitals:   03/22/23 1326  BP: (!) 142/69  Pulse: 60  Resp: 18  Temp: 97.6 F (36.4 C)  TempSrc: Tympanic  SpO2: 98%  Weight: 187 lb 12.8 oz (85.2 kg)  Height: 5\' 2"  (1.575 m)   Physical Exam Cardiovascular:     Rate and Rhythm: Normal rate and regular rhythm.     Heart sounds: Normal heart sounds.  Pulmonary:     Effort: Pulmonary effort is normal.     Breath sounds: Normal breath sounds.  Abdominal:     General: Bowel sounds are normal.     Palpations: Abdomen is soft.  Skin:    General: Skin is warm and dry.  Neurological:     Mental Status: She is alert and oriented to person, place, and time.         Latest Ref Rng & Units 11/19/2022    1:58 PM  CMP  Glucose 70 - 99 mg/dL 94   BUN 8 - 23 mg/dL 9   Creatinine 6.57 - 8.46 mg/dL 9.62   Sodium 952 - 841 mmol/L  133   Potassium 3.5 - 5.1 mmol/L 4.2   Chloride 98 - 111 mmol/L 102   CO2 22 - 32 mmol/L 24   Calcium 8.9 - 10.3 mg/dL 9.8   Total Protein 6.5 - 8.1 g/dL 7.0   Total Bilirubin 0.3 - 1.2 mg/dL 0.6   Alkaline Phos 38 - 126 U/L 89   AST 15 - 41 U/L 24   ALT 0 - 44 U/L 43       Latest Ref Rng & Units 03/22/2023    1:01 PM  CBC  WBC 4.0 - 10.5 K/uL 3.8   Hemoglobin 12.0 - 15.0 g/dL 32.4   Hematocrit 40.1 - 46.0 % 43.4   Platelets 150 - 400 K/uL 215     No images are attached to the encounter.  DG Chest 2 View  Result Date: 03/08/2023 CLINICAL DATA:  Refractory chronic cough for months EXAM: CHEST - 2 VIEW COMPARISON:  None Available. FINDINGS: Normal heart size. Normal mediastinal contour. No pneumothorax. No pleural effusion. Lungs appear clear, with no acute consolidative airspace disease and no pulmonary edema. Surgical clips overlie the upper abdomen on the lateral view. IMPRESSION: No active cardiopulmonary disease. Electronically Signed   By: Delbert Phenix M.D.   On: 03/08/2023 16:05     Assessment and plan- Patient is a 77 y.o. 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  She is s/p 6 cycles of  CarboTaxol chemotherapy ending in June 2021 and this is a routine follow-up visit for ovarian cancer  Clinically patient is doing well with no concerning signs and symptoms of recurrence based on today's exam.  She is following up with GYN in 6 months and I will see her back in 1 year  History of iron deficiency anemia: Hemoglobin is normal at 14 and iron studies are currently pending.  Labs in 6 months in 1 year  Peripheral neuropathy: ContinueLyrica   Visit Diagnosis 1. Iron deficiency   2. Encounter for follow-up surveillance of ovarian cancer   3. Chemotherapy-induced peripheral neuropathy (HCC)      Dr. Owens Shark, MD, MPH [facility] at [facility] [phone] 03/22/2023 3:16 PM

## 2023-03-23 LAB — CA 125: Cancer Antigen (CA) 125: 8 U/mL (ref 0.0–38.1)

## 2023-03-26 ENCOUNTER — Ambulatory Visit: Payer: Medicare Other

## 2023-03-26 ENCOUNTER — Other Ambulatory Visit: Payer: Self-pay | Admitting: Psychiatry

## 2023-03-26 DIAGNOSIS — F330 Major depressive disorder, recurrent, mild: Secondary | ICD-10-CM

## 2023-03-29 ENCOUNTER — Ambulatory Visit
Admission: RE | Admit: 2023-03-29 | Discharge: 2023-03-29 | Disposition: A | Discharge status: Home / Self Care | Payer: Medicare Other | Source: Ambulatory Visit | Attending: Family Medicine | Admitting: Family Medicine

## 2023-03-29 DIAGNOSIS — M542 Cervicalgia: Secondary | ICD-10-CM | POA: Insufficient documentation

## 2023-03-29 DIAGNOSIS — R590 Localized enlarged lymph nodes: Secondary | ICD-10-CM | POA: Diagnosis not present

## 2023-04-01 ENCOUNTER — Encounter: Payer: Self-pay | Admitting: Family Medicine

## 2023-04-07 NOTE — Progress Notes (Deleted)
BH MD/PA/NP OP Progress Note  04/07/2023 11:26 AM Carrie Roberson  MRN:  [id number]  Chief Complaint: No chief complaint on file.  HPI:  According to the chart review, the following events have occurred since the last visit: The patient was seen by oncologist. She was offered IV iron due to low ferritin level.    Discontiue bupropion, try fluoxetine?  Iv iron  Visit Diagnosis: No diagnosis found.  Past Psychiatric History: Please see initial evaluation for full details. I have reviewed the history. No updates at this time.     Past Medical History:  Past Medical History:  Diagnosis Date   Anxiety    Arthritis    Diverticulitis    Family history of breast cancer    Family history of leukemia    GERD (gastroesophageal reflux disease)    History of ischemic colitis 09/02/2017   Hx of colonic polyp    Hypertension    H/O-PT TAKEN OFF BY PCP AS OF 2019   IBS (irritable bowel syndrome)    Ovarian cancer (HCC)    Ovarian cyst    Personal history of chemotherapy 2021   6 treatments   Vaginal atrophy     Past Surgical History:  Procedure Laterality Date   ABDOMINAL HYSTERECTOMY     BREAST BIOPSY Left    needle bx/clip-neg   BREAST BIOPSY Right 06/28/2021   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   CERVICAL BIOPSY  W/ LOOP ELECTRODE EXCISION     CHOLECYSTECTOMY     Colonoscopoy  2007, 2008, 2011   COLONOSCOPY WITH PROPOFOL N/A 05/05/2018   Procedure: COLONOSCOPY WITH PROPOFOL;  Surgeon: Toney Reil, MD;  Location: [facility];  Service: Gastroenterology;  Laterality: N/A;   FLEXIBLE SIGMOIDOSCOPY N/A 07/20/2021   Procedure: FLEXIBLE SIGMOIDOSCOPY;  Surgeon: Regis Bill, MD;  Location: [facility];  Service: Endoscopy;  Laterality: N/A;   HYSTERECTOMY ABDOMINAL WITH SALPINGO-OOPHORECTOMY N/A 08/19/2019   Procedure: HYSTERECTOMY ABDOMINAL WITH SALPINGO-OOPHORECTOMY WITH PELVIC WASHINGS PERITONEAL BIOPSIES AND PARA AORTIC LYMPH NODE DISSECTION;   Surgeon: Vena Austria, MD;  Location: [facility];  Service: Gynecology;  Laterality: N/A;   LEEP     OMENTECTOMY N/A 08/19/2019   Procedure: OMENTECTOMY;  Surgeon: Vena Austria, MD;  Location: [facility];  Service: Gynecology;  Laterality: N/A;   OOPHORECTOMY     PORTA CATH INSERTION N/A 09/11/2019   Procedure: PORTA CATH INSERTION;  Surgeon: Annice Needy, MD;  Location: [facility];  Service: Cardiovascular;  Laterality: N/A;   PORTA CATH REMOVAL N/A 08/16/2022   Procedure: PORTA CATH REMOVAL;  Surgeon: Annice Needy, MD;  Location: [facility];  Service: Cardiovascular;  Laterality: N/A;   TUBAL LIGATION  1999    Family Psychiatric History: Please see initial evaluation for full details. I have reviewed the history. No updates at this time.     Family History:  Family History  Problem Relation Age of Onset   Heart failure Mother    Atrial fibrillation Mother    Hypertension Mother    Diabetes Father    Heart attack Father    Breast cancer Maternal Aunt 85   Breast cancer Maternal Grandmother 33   Breast cancer Cousin        2 mat cousins   Hypertension Sister    Diabetes Sister    Blindness Sister    Hypertension Sister    Arthritis Sister    Leukemia Paternal Aunt    Leukemia Paternal Uncle  Brain cancer Paternal Aunt    Other Brother 48       Drowning accident   Liver cancer Maternal Uncle    Alcohol abuse Maternal Uncle    Prostate cancer Neg Hx    Kidney cancer Neg Hx    BRCA 1/2 Neg Hx     Social History:  Social History   Socioeconomic History   Marital status: Single    Spouse name: Not on file   Number of children: 0   Years of education: Not on file   Highest education level: Associate degree: occupational, Scientist, product/process development, or vocational program  Occupational History   Occupation: Retired  Tobacco Use   Smoking status: Never    Passive exposure: Never   Smokeless tobacco: Never   Tobacco comments:    2bd hand smoke from father   Vaping Use   Vaping status: Never Used  Substance and Sexual Activity   Alcohol use: Never    Comment: rarely   Drug use: Never   Sexual activity: Not Currently    Birth control/protection: Post-menopausal  Other Topics Concern   Not on file  Social History Narrative   Not on file   Social Determinants of Health   Financial Resource Strain: Low Risk  (12/31/2022)   Overall Financial Resource Strain (CARDIA)    Difficulty of Paying Living Expenses: Not hard at all  Food Insecurity: No Food Insecurity (12/31/2022)   Hunger Vital Sign    Worried About Running Out of Food in the Last Year: Never true    Ran Out of Food in the Last Year: Never true  Transportation Needs: No Transportation Needs (12/31/2022)   PRAPARE - Administrator, Civil Service (Medical): No    Lack of Transportation (Non-Medical): No  Physical Activity: Inactive (12/31/2022)   Exercise Vital Sign    Days of Exercise per Week: 0 days    Minutes of Exercise per Session: 0 min  Stress: No Stress Concern Present (12/31/2022)   [employer] of Occupational Health - Occupational Stress Questionnaire    Feeling of Stress : Not at all  Social Connections: Unknown (12/31/2022)   Social Connection and Isolation Panel [NHANES]    Frequency of Communication with Friends and Family: Three times a week    Frequency of Social Gatherings with Friends and Family: Once a week    Attends Religious Services: Patient declined    Database administrator or Organizations: Patient declined    Attends Banker Meetings: Never    Marital Status: Never married    Allergies:  Allergies  Allergen Reactions   Citalopram Nausea And Vomiting   Ivp Dye [Iodinated Contrast Media] Hives and Swelling    hives   Meloxicam     unknown   Adhesive [Tape] Rash   Amlodipine Rash   Ampicillin Rash    Did it involve swelling of the face/tongue/throat, SOB, or low BP? No Did it involve sudden or severe rash/hives,  skin peeling, or any reaction on the inside of your mouth or nose? Yes Did you need to seek medical attention at a hospital or doctor's office? Yes When did it last happen?       10 + years If all above answers are "NO", may proceed with cephalosporin use.   Cefoxitin Rash   Chlorhexidine Rash    Chloraprep Scrub Teal   Estradiol Rash   Estropipate Rash   Olmesartan Medoxomil-Hctz Rash   Sertraline Rash  Sulfa Antibiotics Rash    Metabolic Disorder Labs: No results found for: "HGBA1C", "MPG" No results found for: "PROLACTIN" Lab Results  Component Value Date   CHOL 188 09/02/2017   TRIG 131 09/02/2017   HDL 53 09/02/2017   CHOLHDL 3.5 09/02/2017   VLDL 26 09/02/2017   LDLCALC 109 (H) 09/02/2017   Lab Results  Component Value Date   TSH 0.925 03/08/2023   TSH 1.535 07/19/2021    Therapeutic Level Labs: No results found for: "LITHIUM" No results found for: "VALPROATE" No results found for: "CBMZ"  Current Medications: Current Outpatient Medications  Medication Sig Dispense Refill   acetaminophen (TYLENOL) 500 MG tablet Take 1,000 mg by mouth every 6 (six) hours as needed (for pain.).     diclofenac (VOLTAREN) 75 MG EC tablet Take 1 tablet (75 mg total) by mouth 2 (two) times daily. (Patient not taking: Reported on 03/08/2023) 30 tablet 0   fexofenadine-pseudoephedrine (ALLEGRA-D 24) 180-240 MG 24 hr tablet Take 1 tablet by mouth daily.     FLUoxetine (PROZAC) 10 MG capsule TAKE 1 CAPSULE BY MOUTH EVERY DAY 90 capsule 0   hydrOXYzine (ATARAX) 25 MG tablet Take 25 mg by mouth daily.     loratadine (CLARITIN) 10 MG tablet Take 10 mg by mouth daily. otc (Patient not taking: Reported on 03/22/2023)     MYRBETRIQ 50 MG TB24 tablet Take 50 mg by mouth daily.     omeprazole (PRILOSEC) 10 MG capsule Take 10 mg by mouth daily.     Polyethylene Glycol 3350 (MIRALAX PO) Take by mouth.     Probiotic Product (UP4 PROBIOTICS WOMENS PO) Take 1 tablet by mouth daily.      triamcinolone (KENALOG) 0.025 % cream Apply topically 2 (two) times daily.     XYZAL ALLERGY 24HR 5 MG tablet Take 10 mg by mouth daily.     No current facility-administered medications for this visit.   Facility-Administered Medications Ordered in Other Visits  Medication Dose Route Frequency Provider Last Rate Last Admin   sodium chloride flush (NS) 0.9 % injection 10 mL  10 mL Intravenous PRN Creig Hines, MD   10 mL at 01/05/20 0750     Musculoskeletal: Strength & Muscle Tone: within normal limits Gait & Station: normal Patient leans: N/A  Psychiatric Specialty Exam: Review of Systems  There were no vitals taken for this visit.There is no height or weight on file to calculate BMI.  General Appearance: {Appearance:22683}  Eye Contact:  {BHH EYE CONTACT:22684}  Speech:  Clear and Coherent  Volume:  Normal  Mood:  {BHH MOOD:22306}  Affect:  {Affect (PAA):22687}  Thought Process:  Coherent  Orientation:  Full (Time, Place, and Person)  Thought Content: Logical   Suicidal Thoughts:  {ST/HT (PAA):22692}  Homicidal Thoughts:  {ST/HT (PAA):22692}  Memory:  Immediate;   Good  Judgement:  Good  Insight:  {Insight (PAA):22695}  Psychomotor Activity:  Normal  Concentration:  Attention Span: Good  Recall:  Good  Fund of Knowledge: Good  Language: Good  Akathisia:  No  Handed:  Right  AIMS (if indicated): not done  Assets:  Communication Skills Desire for Improvement  ADL's:  Intact  Cognition: WNL  Sleep:  {BHH GOOD/FAIR/POOR:22877}   Screenings: GAD-7    Flowsheet Row Office Visit from 03/21/2023 in [facility] at [facility] Office Visit from 03/08/2023 in [facility] Primary Care & [facility] Medicine at [facility] Office Visit from 11/13/2022 in [facility] Primary  Care & Sports Medicine at [facility] Office Visit from 08/27/2022 in [facility] Office Visit from 06/25/2022 in [facility]  Total GAD-7 Score 0 0 0 2 1      PHQ2-9    Flowsheet Row Office Visit from 03/21/2023 in [facility] Primary Care & Sports Medicine at [facility] Office Visit from 03/08/2023 in [facility] Primary Care & Sports Medicine at [facility] Office Visit from 11/13/2022 in [facility] Primary Care & Sports Medicine at [facility] Office Visit from 08/27/2022 in [facility] Office Visit from 06/25/2022 in [facility]  PHQ-2 Total Score 0 0 0 0 0  PHQ-9 Total Score 0 0 0 -- --      Flowsheet Row Office Visit from 08/27/2022 in [facility] Office Visit from 06/25/2022 in [facility] Office Visit from 01/08/2022 in [facility]  C-SSRS RISK CATEGORY No Risk No Risk No Risk        Assessment and Plan:  Carrie Roberson is a 77 y.o. 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    1. MDD (major depressive disorder), recurrent episode, mild (HCC) 2. Generalized anxiety disorder Acute stressors include: unexpected loss of her sister in [place], who was suffering from diabetes, conflict with her niece  Other stressors include: conflict with her sister, loss of her mother, her brother age 79, history of ovarian cancer    History:    She experiences significant worsening in anhedonia, low motivation since the last visit without any new stressors.  Will switch from venlafaxine to bupropion to see if it is more effective for her condition. This medication is chosen specifically due to her low energy levels, and it is weight-neutral. She has no known history of seizure.  Discussed potential risk of headache,  palpitation and, worsening in anxiety.  Will obtain lab to rule out medical health issues contributing to her symptoms. Coached behavioral activation.      Plan Start bupropion 150 mg daily  Next appointment- 9/19 at 1:30, in person Hold venlafaxine, lorazepam (was on 0.5 mg daily- she hs not used it) (Try melatonin 3 mg at night, 2 hours before going to the bed)   Past trials of medication: she does not recall- sertraline (rash), citalopram (nausea), venlafaxine (dizziness from 75 mg)    The patient demonstrates the following risk factors for suicide: Chronic risk factors for suicide include: psychiatric disorder of depression, anxiety . Acute risk factors for suicide include: unemployment and loss (financial, interpersonal, professional). Protective factors for this patient include: positive social support, coping skills, and hope for the future. Considering these factors, the overall suicide risk at this point appears to be low. Patient is appropriate for outpatient follow up.       Collaboration of Care: Collaboration of Care: {BH OP Collaboration of Care:21014065}  Patient/Guardian was advised Release of Information must be obtained prior to any record release in order to collaborate their care with an outside provider. Patient/Guardian was advised if they have not already done so to contact the registration department to sign all necessary forms in order for [place] to release information regarding their care.   Consent: Patient/Guardian gives verbal consent  for treatment and assignment of benefits for services provided during this visit. Patient/Guardian expressed understanding and agreed to proceed.    Neysa Hotter, MD 04/07/2023, 11:26 AM

## 2023-04-11 ENCOUNTER — Ambulatory Visit: Payer: Medicare Other | Admitting: Psychiatry

## 2023-04-25 ENCOUNTER — Telehealth: Payer: Self-pay

## 2023-04-25 ENCOUNTER — Other Ambulatory Visit: Payer: Self-pay | Admitting: Family Medicine

## 2023-04-25 DIAGNOSIS — I10 Essential (primary) hypertension: Secondary | ICD-10-CM

## 2023-04-25 NOTE — Telephone Encounter (Signed)
Called pt to let her know we got a refill request for lisinopril. Pt stated she didn't ask for the refill that the pharmacy automatically renewed it. Pt asked should she be on BP medication. Asked pt doe she check her BP at home she stated no. Told Pt to monitor her BP 3 times a day (morning, noon, night). Told pt if her BP is elevated call us so we can schedule an appt for her to come in.  [place]

## 2023-04-27 NOTE — Progress Notes (Deleted)
BH MD/PA/NP OP Progress Note  04/27/2023 11:44 AM Carrie Roberson  MRN:  [id number]  Chief Complaint: No chief complaint on file.  HPI:  According to the chart review, the following events have occurred since the last visit: The patient was seen by oncologist. She was offered IV iron due to low ferritin level.    Discontiue bupropion, try fluoxetine?  Iv iron  Visit Diagnosis: No diagnosis found.  Past Psychiatric History: Please see initial evaluation for full details. I have reviewed the history. No updates at this time.     Past Medical History:  Past Medical History:  Diagnosis Date   Anxiety    Arthritis    Diverticulitis    Family history of breast cancer    Family history of leukemia    GERD (gastroesophageal reflux disease)    History of ischemic colitis 09/02/2017   Hx of colonic polyp    Hypertension    H/O-PT TAKEN OFF BY PCP AS OF 2019   IBS (irritable bowel syndrome)    Ovarian cancer (HCC)    Ovarian cyst    Personal history of chemotherapy 2021   6 treatments   Vaginal atrophy     Past Surgical History:  Procedure Laterality Date   ABDOMINAL HYSTERECTOMY     BREAST BIOPSY Left    needle bx/clip-neg   BREAST BIOPSY Right 06/28/2021   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   CERVICAL BIOPSY  W/ LOOP ELECTRODE EXCISION     CHOLECYSTECTOMY     Colonoscopoy  2007, 2008, 2011   COLONOSCOPY WITH PROPOFOL N/A 05/05/2018   Procedure: COLONOSCOPY WITH PROPOFOL;  Surgeon: Toney Reil, MD;  Location: [facility];  Service: Gastroenterology;  Laterality: N/A;   FLEXIBLE SIGMOIDOSCOPY N/A 07/20/2021   Procedure: FLEXIBLE SIGMOIDOSCOPY;  Surgeon: Regis Bill, MD;  Location: [facility];  Service: Endoscopy;  Laterality: N/A;   HYSTERECTOMY ABDOMINAL WITH SALPINGO-OOPHORECTOMY N/A 08/19/2019   Procedure: HYSTERECTOMY ABDOMINAL WITH SALPINGO-OOPHORECTOMY WITH PELVIC WASHINGS PERITONEAL BIOPSIES AND PARA AORTIC LYMPH NODE DISSECTION;   Surgeon: Vena Austria, MD;  Location: [facility];  Service: Gynecology;  Laterality: N/A;   LEEP     OMENTECTOMY N/A 08/19/2019   Procedure: OMENTECTOMY;  Surgeon: Vena Austria, MD;  Location: [facility];  Service: Gynecology;  Laterality: N/A;   OOPHORECTOMY     PORTA CATH INSERTION N/A 09/11/2019   Procedure: PORTA CATH INSERTION;  Surgeon: Annice Needy, MD;  Location: [facility];  Service: Cardiovascular;  Laterality: N/A;   PORTA CATH REMOVAL N/A 08/16/2022   Procedure: PORTA CATH REMOVAL;  Surgeon: Annice Needy, MD;  Location: [facility];  Service: Cardiovascular;  Laterality: N/A;   TUBAL LIGATION  1999    Family Psychiatric History: Please see initial evaluation for full details. I have reviewed the history. No updates at this time.     Family History:  Family History  Problem Relation Age of Onset   Heart failure Mother    Atrial fibrillation Mother    Hypertension Mother    Diabetes Father    Heart attack Father    Breast cancer Maternal Aunt 51   Breast cancer Maternal Grandmother 14   Breast cancer Cousin        2 mat cousins   Hypertension Sister    Diabetes Sister    Blindness Sister    Hypertension Sister    Arthritis Sister    Leukemia Paternal Aunt    Leukemia Paternal Uncle  Brain cancer Paternal Aunt    Other Brother 67       Drowning accident   Liver cancer Maternal Uncle    Alcohol abuse Maternal Uncle    Prostate cancer Neg Hx    Kidney cancer Neg Hx    BRCA 1/2 Neg Hx     Social History:  Social History   Socioeconomic History   Marital status: Single    Spouse name: Not on file   Number of children: 0   Years of education: Not on file   Highest education level: Associate degree: occupational, Scientist, product/process development, or vocational program  Occupational History   Occupation: Retired  Tobacco Use   Smoking status: Never    Passive exposure: Never   Smokeless tobacco: Never   Tobacco comments:    2bd hand smoke from father   Vaping Use   Vaping status: Never Used  Substance and Sexual Activity   Alcohol use: Never    Comment: rarely   Drug use: Never   Sexual activity: Not Currently    Birth control/protection: Post-menopausal  Other Topics Concern   Not on file  Social History Narrative   Not on file   Social Determinants of Health   Financial Resource Strain: Low Risk  (12/31/2022)   Overall Financial Resource Strain (CARDIA)    Difficulty of Paying Living Expenses: Not hard at all  Food Insecurity: No Food Insecurity (12/31/2022)   Hunger Vital Sign    Worried About Running Out of Food in the Last Year: Never true    Ran Out of Food in the Last Year: Never true  Transportation Needs: No Transportation Needs (12/31/2022)   PRAPARE - Administrator, Civil Service (Medical): No    Lack of Transportation (Non-Medical): No  Physical Activity: Inactive (12/31/2022)   Exercise Vital Sign    Days of Exercise per Week: 0 days    Minutes of Exercise per Session: 0 min  Stress: No Stress Concern Present (12/31/2022)   [employer] of Occupational Health - Occupational Stress Questionnaire    Feeling of Stress : Not at all  Social Connections: Unknown (12/31/2022)   Social Connection and Isolation Panel [NHANES]    Frequency of Communication with Friends and Family: Three times a week    Frequency of Social Gatherings with Friends and Family: Once a week    Attends Religious Services: Patient declined    Database administrator or Organizations: Patient declined    Attends Banker Meetings: Never    Marital Status: Never married    Allergies:  Allergies  Allergen Reactions   Citalopram Nausea And Vomiting   Ivp Dye [Iodinated Contrast Media] Hives and Swelling    hives   Meloxicam     unknown   Adhesive [Tape] Rash   Amlodipine Rash   Ampicillin Rash    Did it involve swelling of the face/tongue/throat, SOB, or low BP? No Did it involve sudden or severe rash/hives,  skin peeling, or any reaction on the inside of your mouth or nose? Yes Did you need to seek medical attention at a hospital or doctor's office? Yes When did it last happen?       10 + years If all above answers are "NO", may proceed with cephalosporin use.   Cefoxitin Rash   Chlorhexidine Rash    Chloraprep Scrub Teal   Estradiol Rash   Estropipate Rash   Olmesartan Medoxomil-Hctz Rash   Sertraline Rash  Sulfa Antibiotics Rash    Metabolic Disorder Labs: No results found for: "HGBA1C", "MPG" No results found for: "PROLACTIN" Lab Results  Component Value Date   CHOL 188 09/02/2017   TRIG 131 09/02/2017   HDL 53 09/02/2017   CHOLHDL 3.5 09/02/2017   VLDL 26 09/02/2017   LDLCALC 109 (H) 09/02/2017   Lab Results  Component Value Date   TSH 0.925 03/08/2023   TSH 1.535 07/19/2021    Therapeutic Level Labs: No results found for: "LITHIUM" No results found for: "VALPROATE" No results found for: "CBMZ"  Current Medications: Current Outpatient Medications  Medication Sig Dispense Refill   acetaminophen (TYLENOL) 500 MG tablet Take 1,000 mg by mouth every 6 (six) hours as needed (for pain.).     diclofenac (VOLTAREN) 75 MG EC tablet Take 1 tablet (75 mg total) by mouth 2 (two) times daily. (Patient not taking: Reported on 03/08/2023) 30 tablet 0   fexofenadine-pseudoephedrine (ALLEGRA-D 24) 180-240 MG 24 hr tablet Take 1 tablet by mouth daily.     FLUoxetine (PROZAC) 10 MG capsule TAKE 1 CAPSULE BY MOUTH EVERY DAY 90 capsule 0   hydrOXYzine (ATARAX) 25 MG tablet Take 25 mg by mouth daily.     loratadine (CLARITIN) 10 MG tablet Take 10 mg by mouth daily. otc (Patient not taking: Reported on 03/22/2023)     MYRBETRIQ 50 MG TB24 tablet Take 50 mg by mouth daily.     omeprazole (PRILOSEC) 10 MG capsule Take 10 mg by mouth daily.     Polyethylene Glycol 3350 (MIRALAX PO) Take by mouth.     Probiotic Product (UP4 PROBIOTICS WOMENS PO) Take 1 tablet by mouth daily.      triamcinolone (KENALOG) 0.025 % cream Apply topically 2 (two) times daily.     XYZAL ALLERGY 24HR 5 MG tablet Take 10 mg by mouth daily.     No current facility-administered medications for this visit.   Facility-Administered Medications Ordered in Other Visits  Medication Dose Route Frequency Provider Last Rate Last Admin   sodium chloride flush (NS) 0.9 % injection 10 mL  10 mL Intravenous PRN Creig Hines, MD   10 mL at 01/05/20 0750     Musculoskeletal: Strength & Muscle Tone: within normal limits Gait & Station: normal Patient leans: N/A  Psychiatric Specialty Exam: Review of Systems  There were no vitals taken for this visit.There is no height or weight on file to calculate BMI.  General Appearance: {Appearance:22683}  Eye Contact:  {BHH EYE CONTACT:22684}  Speech:  Clear and Coherent  Volume:  Normal  Mood:  {BHH MOOD:22306}  Affect:  {Affect (PAA):22687}  Thought Process:  Coherent  Orientation:  Full (Time, Place, and Person)  Thought Content: Logical   Suicidal Thoughts:  {ST/HT (PAA):22692}  Homicidal Thoughts:  {ST/HT (PAA):22692}  Memory:  Immediate;   Good  Judgement:  Good  Insight:  {Insight (PAA):22695}  Psychomotor Activity:  Normal  Concentration:  Attention Span: Good  Recall:  Good  Fund of Knowledge: Good  Language: Good  Akathisia:  No  Handed:  Right  AIMS (if indicated): not done  Assets:  Communication Skills Desire for Improvement  ADL's:  Intact  Cognition: WNL  Sleep:  {BHH GOOD/FAIR/POOR:22877}   Screenings: GAD-7    Flowsheet Row Office Visit from 03/21/2023 in [facility] Medicine at [facility] Office Visit from 03/08/2023 in [facility] Primary [facility] & [facility] Medicine at [facility] Office Visit from 11/13/2022 in [facility] Primary  Care & Sports Medicine at [facility] Office Visit from 08/27/2022 in [facility] Office Visit from 06/25/2022 in [facility]  Total GAD-7 Score 0 0 0 2 1      PHQ2-9    Flowsheet Row Office Visit from 03/21/2023 in [facility] Primary Care & Sports Medicine at [facility] Office Visit from 03/08/2023 in [facility] Primary Care & Sports Medicine at [facility] Office Visit from 11/13/2022 in [facility] Primary Care & Sports Medicine at [facility] Office Visit from 08/27/2022 in [facility] Office Visit from 06/25/2022 in [facility]  PHQ-2 Total Score 0 0 0 0 0  PHQ-9 Total Score 0 0 0 -- --      Flowsheet Row Office Visit from 08/27/2022 in [facility] Office Visit from 06/25/2022 in [facility] Office Visit from 01/08/2022 in [facility]  C-SSRS RISK CATEGORY No Risk No Risk No Risk        Assessment and Plan:  Carrie Roberson is a 77 y.o. 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    1. MDD (major depressive disorder), recurrent episode, mild (HCC) 2. Generalized anxiety disorder Acute stressors include: unexpected loss of her sister in [place], who was suffering from diabetes, conflict with her niece  Other stressors include: conflict with her sister, loss of her mother, her brother age 3, history of ovarian cancer    History:    She experiences significant worsening in anhedonia, low motivation since the last visit without any new stressors.  Will switch from venlafaxine to bupropion to see if it is more effective for her condition. This medication is chosen specifically due to her low energy levels, and it is weight-neutral. She has no known history of seizure.  Discussed potential risk of headache,  palpitation and, worsening in anxiety.  Will obtain lab to rule out medical health issues contributing to her symptoms. Coached behavioral activation.      Plan Start bupropion 150 mg daily  Next appointment- 9/19 at 1:30, in person Hold venlafaxine, lorazepam (was on 0.5 mg daily- she hs not used it) (Try melatonin 3 mg at night, 2 hours before going to the bed)   Past trials of medication: she does not recall- sertraline (rash), citalopram (nausea), venlafaxine (dizziness from 75 mg)    The patient demonstrates the following risk factors for suicide: Chronic risk factors for suicide include: psychiatric disorder of depression, anxiety . Acute risk factors for suicide include: unemployment and loss (financial, interpersonal, professional). Protective factors for this patient include: positive social support, coping skills, and hope for the future. Considering these factors, the overall suicide risk at this point appears to be low. Patient is appropriate for outpatient follow up.       Collaboration of Care: Collaboration of Care: {BH OP Collaboration of Care:21014065}  Patient/Guardian was advised Release of Information must be obtained prior to any record release in order to collaborate their care with an outside provider. Patient/Guardian was advised if they have not already done so to contact the registration department to sign all necessary forms in order for [place] to release information regarding their care.   Consent: Patient/Guardian gives verbal consent  for treatment and assignment of benefits for services provided during this visit. Patient/Guardian expressed understanding and agreed to proceed.    Neysa Hotter, MD 04/27/2023, 11:44 AM

## 2023-05-01 ENCOUNTER — Telehealth: Payer: Self-pay | Admitting: Psychiatry

## 2023-05-01 NOTE — Telephone Encounter (Signed)
Patient cancelled her appointment stating she has been thinking about it for awhile and wants to try this on her own. Advised her to call if she needed anything

## 2023-05-02 ENCOUNTER — Ambulatory Visit: Payer: Medicare Other | Admitting: Psychiatry

## 2023-05-20 ENCOUNTER — Other Ambulatory Visit: Payer: Self-pay | Admitting: Obstetrics and Gynecology

## 2023-05-20 DIAGNOSIS — Z1231 Encounter for screening mammogram for malignant neoplasm of breast: Secondary | ICD-10-CM

## 2023-05-30 ENCOUNTER — Other Ambulatory Visit: Payer: Self-pay | Admitting: Oncology

## 2023-05-30 ENCOUNTER — Other Ambulatory Visit: Payer: Self-pay | Admitting: Psychiatry

## 2023-06-05 ENCOUNTER — Ambulatory Visit
Admission: RE | Admit: 2023-06-05 | Discharge: 2023-06-05 | Disposition: A | Discharge status: Home / Self Care | Payer: Medicare Other | Source: Ambulatory Visit | Attending: Obstetrics and Gynecology | Admitting: Obstetrics and Gynecology

## 2023-06-05 DIAGNOSIS — Z1231 Encounter for screening mammogram for malignant neoplasm of breast: Secondary | ICD-10-CM | POA: Diagnosis not present

## 2023-06-24 DIAGNOSIS — Z23 Encounter for immunization: Secondary | ICD-10-CM | POA: Diagnosis not present

## 2023-07-01 ENCOUNTER — Other Ambulatory Visit: Payer: Self-pay | Admitting: Oncology

## 2023-07-01 ENCOUNTER — Other Ambulatory Visit: Payer: Self-pay | Admitting: Psychiatry

## 2023-07-01 DIAGNOSIS — Z1231 Encounter for screening mammogram for malignant neoplasm of breast: Secondary | ICD-10-CM | POA: Diagnosis not present

## 2023-07-01 DIAGNOSIS — Z01419 Encounter for gynecological examination (general) (routine) without abnormal findings: Secondary | ICD-10-CM | POA: Diagnosis not present

## 2023-07-01 DIAGNOSIS — Z1331 Encounter for screening for depression: Secondary | ICD-10-CM | POA: Diagnosis not present

## 2023-07-03 ENCOUNTER — Ambulatory Visit: Payer: Medicare Other | Admitting: Physician Assistant

## 2023-07-04 ENCOUNTER — Telehealth: Payer: Self-pay

## 2023-07-04 NOTE — Telephone Encounter (Signed)
Hello! Pt called and stated that she last spoke to [place] but she would like to get set up with someone. She would like a call back to discuss her options with therapy.

## 2023-07-05 ENCOUNTER — Ambulatory Visit: Payer: Medicare Other | Admitting: Physician Assistant

## 2023-07-08 ENCOUNTER — Ambulatory Visit: Payer: Medicare Other | Admitting: Physician Assistant

## 2023-07-08 ENCOUNTER — Encounter: Payer: Self-pay | Admitting: Physician Assistant

## 2023-07-08 VITALS — BP 138/74 | HR 80 | Ht 62.0 in | Wt 191.0 lb

## 2023-07-08 DIAGNOSIS — Z87440 Personal history of urinary (tract) infections: Secondary | ICD-10-CM | POA: Diagnosis not present

## 2023-07-08 DIAGNOSIS — N390 Urinary tract infection, site not specified: Secondary | ICD-10-CM

## 2023-07-08 DIAGNOSIS — N3281 Overactive bladder: Secondary | ICD-10-CM

## 2023-07-08 LAB — BLADDER SCAN AMB NON-IMAGING: Scan Result: 0

## 2023-07-08 MED ORDER — MIRABEGRON ER 50 MG PO TB24: 50.0000 mg | ORAL_TABLET | Freq: Every day | ORAL | 11 refills | Status: DC

## 2023-07-08 NOTE — Progress Notes (Signed)
07/08/2023 10:14 AM   Carrie Roberson 09-12-1945 [id number]  CC: Chief Complaint  Patient presents with   Over Active Bladder   HPI: Carrie Roberson is a 77 y.o. 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 difficile colitis, OAB wet with mixed incontinence on Myrbetriq 50 mg daily, and recurrent UTI previously on suppressive Macrobid who presents today for annual follow-up.   Today she reports she stopped suppressive Macrobid about 6 months ago and has been infection free since.  She has no acute concerns today.  She remains on a daily lactobacillus containing probiotic.  She stopped Myrbetriq due to cost temporarily earlier this year and noticed increased nocturia with this.  There had been a billing error and her cost went back to normal so she resumed it.  Overall she is very pleased with her regimen.  PVR 0 mL.  PMH: Past Medical History:  Diagnosis Date   Anxiety    Arthritis    Diverticulitis    Family history of breast cancer    Family history of leukemia    GERD (gastroesophageal reflux disease)    History of ischemic colitis 09/02/2017   Hx of colonic polyp    Hypertension    H/O-PT TAKEN OFF BY PCP AS OF 2019   IBS (irritable bowel syndrome)    Ovarian cancer (HCC)    Ovarian cyst    Personal history of chemotherapy 2021   6 treatments   Vaginal atrophy     Surgical History: Past Surgical History:  Procedure Laterality Date   ABDOMINAL HYSTERECTOMY     BREAST BIOPSY Left    needle bx/clip-neg   BREAST BIOPSY Right 06/28/2021   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   CERVICAL BIOPSY  W/ LOOP ELECTRODE EXCISION     CHOLECYSTECTOMY     Colonoscopoy  2007, 2008, 2011   COLONOSCOPY WITH PROPOFOL N/A 05/05/2018   Procedure: COLONOSCOPY WITH PROPOFOL;  Surgeon: Toney Reil, MD;  Location: [facility];  Service: Gastroenterology;  Laterality: N/A;    FLEXIBLE SIGMOIDOSCOPY N/A 07/20/2021   Procedure: FLEXIBLE SIGMOIDOSCOPY;  Surgeon: Regis Bill, MD;  Location: [facility];  Service: Endoscopy;  Laterality: N/A;   HYSTERECTOMY ABDOMINAL WITH SALPINGO-OOPHORECTOMY N/A 08/19/2019   Procedure: HYSTERECTOMY ABDOMINAL WITH SALPINGO-OOPHORECTOMY WITH PELVIC WASHINGS PERITONEAL BIOPSIES AND PARA AORTIC LYMPH NODE DISSECTION;  Surgeon: Vena Austria, MD;  Location: [facility];  Service: Gynecology;  Laterality: N/A;   LEEP     OMENTECTOMY N/A 08/19/2019   Procedure: OMENTECTOMY;  Surgeon: Vena Austria, MD;  Location: [facility];  Service: Gynecology;  Laterality: N/A;   OOPHORECTOMY     PORTA CATH INSERTION N/A 09/11/2019   Procedure: PORTA CATH INSERTION;  Surgeon: Annice Needy, MD;  Location: [facility];  Service: Cardiovascular;  Laterality: N/A;   PORTA CATH REMOVAL N/A 08/16/2022   Procedure: PORTA CATH REMOVAL;  Surgeon: Annice Needy, MD;  Location: [facility];  Service: Cardiovascular;  Laterality: N/A;   TUBAL LIGATION  1999    Home Medications:  Allergies as of 07/08/2023       Reactions   Citalopram Nausea And Vomiting   Ivp Dye [iodinated Contrast Media] Hives, Swelling   hives   Meloxicam    unknown   Adhesive [tape] Rash   Amlodipine Rash   Ampicillin Rash   Did it involve swelling of the face/tongue/throat, SOB, or low BP? No Did it  involve sudden or severe rash/hives, skin peeling, or any reaction on the inside of your mouth or nose? Yes Did you need to seek medical attention at a hospital or doctor's office? Yes When did it last happen?       10 + years If all above answers are "NO", may proceed with cephalosporin use.   Cefoxitin Rash   Chlorhexidine Rash   Chloraprep Scrub Teal   Estradiol Rash   Estropipate Rash   Olmesartan Medoxomil-hctz Rash   Sertraline Rash      Sulfa Antibiotics Rash        Medication List        Accurate as of July 08, 2023 10:14 AM. If you  have any questions, ask your nurse or doctor.          acetaminophen 500 MG tablet Commonly known as: TYLENOL Take 1,000 mg by mouth every 6 (six) hours as needed (for pain.).   diclofenac 75 MG EC tablet Commonly known as: VOLTAREN Take 1 tablet (75 mg total) by mouth 2 (two) times daily.   fexofenadine-pseudoephedrine 180-240 MG 24 hr tablet Commonly known as: ALLEGRA-D 24 Take 1 tablet by mouth daily.   FLUoxetine 10 MG capsule Commonly known as: PROZAC TAKE 1 CAPSULE BY MOUTH EVERY DAY   hydrOXYzine 25 MG tablet Commonly known as: ATARAX Take 25 mg by mouth daily.   loratadine 10 MG tablet Commonly known as: CLARITIN Take 10 mg by mouth daily. otc   MIRALAX PO Take by mouth.   Myrbetriq 50 MG Tb24 tablet Generic drug: mirabegron ER Take 50 mg by mouth daily.   omeprazole 10 MG capsule Commonly known as: PRILOSEC Take 10 mg by mouth daily.   pregabalin 75 MG capsule Commonly known as: LYRICA TAKE 1 CAPSULE BY MOUTH TWICE A DAY   triamcinolone 0.025 % cream Commonly known as: KENALOG Apply topically 2 (two) times daily.   UP4 PROBIOTICS WOMENS PO Take 1 tablet by mouth daily.   Xyzal Allergy 24HR 5 MG tablet Generic drug: levocetirizine Take 10 mg by mouth daily.        Allergies:  Allergies  Allergen Reactions   Citalopram Nausea And Vomiting   Ivp Dye [Iodinated Contrast Media] Hives and Swelling    hives   Meloxicam     unknown   Adhesive [Tape] Rash   Amlodipine Rash   Ampicillin Rash    Did it involve swelling of the face/tongue/throat, SOB, or low BP? No Did it involve sudden or severe rash/hives, skin peeling, or any reaction on the inside of your mouth or nose? Yes Did you need to seek medical attention at a hospital or doctor's office? Yes When did it last happen?       10 + years If all above answers are "NO", may proceed with cephalosporin use.   Cefoxitin Rash   Chlorhexidine Rash    Chloraprep Scrub Teal   Estradiol Rash    Estropipate Rash   Olmesartan Medoxomil-Hctz Rash   Sertraline Rash        Sulfa Antibiotics Rash    Family History: Family History  Problem Relation Age of Onset   Heart failure Mother    Atrial fibrillation Mother    Hypertension Mother    Diabetes Father    Heart attack Father    Breast cancer Maternal Aunt 22   Breast cancer Maternal Grandmother 80   Breast cancer Cousin        2 mat cousins   Hypertension  Sister    Diabetes Sister    Blindness Sister    Hypertension Sister    Arthritis Sister    Leukemia Paternal Aunt    Leukemia Paternal Uncle    Brain cancer Paternal Aunt    Other Brother 13       Drowning accident   Liver cancer Maternal Uncle    Alcohol abuse Maternal Uncle    Prostate cancer Neg Hx    Kidney cancer Neg Hx    BRCA 1/2 Neg Hx     Social History:   reports that she has never smoked. She has never been exposed to tobacco smoke. She has never used smokeless tobacco. She reports that she does not drink alcohol and does not use drugs.  Physical Exam: BP 138/74   Pulse 80   Ht 5\' 2"  (1.575 m)   Wt 191 lb (86.6 kg)   BMI 34.93 kg/m   Constitutional:  Alert and oriented, no acute distress, nontoxic appearing HEENT: [place], AT Cardiovascular: No clubbing, cyanosis, or edema Respiratory: Normal respiratory effort, no increased work of breathing Skin: No rashes, bruises or suspicious lesions Neurologic: Grossly intact, no focal deficits, moving all 4 extremities Psychiatric: Normal mood and affect  Laboratory Data: Results for orders placed or performed in visit on 07/08/23  Bladder Scan (Post Void Residual) in office   Collection Time: 07/08/23  9:57 AM  Result Value Ref Range   Scan Result 0    Assessment & Plan:   1. OAB (overactive bladder) (Primary) Well-controlled on Myrbetriq 50 mg, emptying fully.  Will continue and see her back next year. - Bladder Scan (Post Void Residual) in office - mirabegron ER (MYRBETRIQ) 50 MG TB24 tablet;  Take 1 tablet (50 mg total) by mouth daily.  Dispense: 30 tablet; Refill: 11  2. Recurrent UTI Infection free x 6 months off suppressive Macrobid.  Continue daily probiotics.  Return in about 1 year (around 07/07/2024) for Annual OAB f/u with [facility].  Carman Ching, PA-C  [facility]  [address] [place], [place] [address] [phone]

## 2023-07-09 ENCOUNTER — Inpatient Hospital Stay: Payer: Self-pay | Attending: Oncology

## 2023-07-09 NOTE — Progress Notes (Signed)
[facility] CSW Progress Note  Clinical Child psychotherapist returned patient's call.  She requested assistance with counseling.  Since patient is not in treatment, CSW is unable to provide.  She sees a psychiatrist with a counseling in the practice which she will follow up with.  CSW also provided her information about [facility].  Patient expressed no other needs.   Dorothey Baseman, LCSW Clinical Social Worker [facility]

## 2023-07-29 NOTE — Progress Notes (Signed)
 [facility] MD/PA/NP OP Progress Note  08/05/2023 12:48 PM Carrie Roberson  MRN:  [id number]  Chief Complaint:  Chief Complaint  Patient presents with   Follow-up   HPI:  - she was seen, last in August 2024 This is a follow-up appointment for depression, anxiety and binge eating.  She states that she has been feeling depressed.  She had that some of the day and that while she is here.  It occurred when they thought that her nephew might have cancer, although it turned out that he did not.  She felt guilty.  She felt that she does not have much use.  She could not see a therapist as her therapist moved to [place]. 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  She believes her mood was better when she was seen in the office.   The patient has mood symptoms as in PHQ-9/GAD-7. She enjoys taking care of her two dogs. She has been taking clonazepam again due to anxiety.  She does not do anything other than eating.  She feels very stressed due to recent weight gain, and she feels heavier. She eats snacks when she is stressed. She did not measure the weight this morning.  She wants to have purpose in her life, and want to do projects.  She does not recall if she had adverse reaction from bupropion .  She is willing to try the medication. She denies SI.   Household: sister, two dogs Marital status: single Number of children: 0 Employment: retired in 2002, Designer, Multimedia:   She was raised in [place]. She has three siblings. Her brother deceased from drowning accident at age 22. She was 78 year old that time. She states that she thinks about him every day, and has never passed it.  She was taking care of her mother, who passed away at age 17. Although she misses her mother, she thinks it was better for her as her mother did not have good quality of life later in the course.  Wt Readings from Last 3 Encounters:  07/08/23 191 lb (86.6 kg)  03/22/23 187 lb 12.8 oz (85.2 kg)  03/21/23 189 lb  (85.7 kg)     Visit Diagnosis:    ICD-10-CM   1. MDD (major depressive disorder), recurrent episode, mild (HCC)  F33.0     2. Generalized anxiety disorder  F41.1     3. Binge eating  R63.2       Past Psychiatric History: Please see initial evaluation for full details. I have reviewed the history. No updates at this time.     Past Medical History:  Past Medical History:  Diagnosis Date   Anxiety    Arthritis    Diverticulitis    Family history of breast cancer    Family history of leukemia    GERD (gastroesophageal reflux disease)    History of ischemic colitis 09/02/2017   Hx of colonic polyp    Hypertension    H/O-PT TAKEN OFF BY PCP AS OF 2019   IBS (irritable bowel syndrome)    Ovarian cancer (HCC)    Ovarian cyst    Personal history of chemotherapy 2021   6 treatments   Vaginal atrophy     Past Surgical History:  Procedure Laterality Date   ABDOMINAL HYSTERECTOMY     BREAST BIOPSY Left    needle bx/clip-neg   BREAST BIOPSY Right 06/28/2021   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   CERVICAL  BIOPSY  W/ LOOP ELECTRODE EXCISION     CHOLECYSTECTOMY     Colonoscopoy  2007, 2008, 2011   COLONOSCOPY WITH PROPOFOL  N/A 05/05/2018   Procedure: COLONOSCOPY WITH PROPOFOL ;  Surgeon: Unk Corinn Skiff, MD;  Location: [facility] ENDOSCOPY;  Service: Gastroenterology;  Laterality: N/A;   FLEXIBLE SIGMOIDOSCOPY N/A 07/20/2021   Procedure: FLEXIBLE SIGMOIDOSCOPY;  Surgeon: Maryruth Ole DASEN, MD;  Location: [facility] ENDOSCOPY;  Service: Endoscopy;  Laterality: N/A;   HYSTERECTOMY ABDOMINAL WITH SALPINGO-OOPHORECTOMY N/A 08/19/2019   Procedure: HYSTERECTOMY ABDOMINAL WITH SALPINGO-OOPHORECTOMY WITH PELVIC WASHINGS PERITONEAL BIOPSIES AND PARA AORTIC LYMPH NODE DISSECTION;  Surgeon: Lake Read, MD;  Location: [facility] ORS;  Service: Gynecology;  Laterality: N/A;   LEEP     OMENTECTOMY N/A 08/19/2019   Procedure: OMENTECTOMY;  Surgeon: Lake Read, MD;  Location: [facility]  ORS;  Service: Gynecology;  Laterality: N/A;   OOPHORECTOMY     PORTA CATH INSERTION N/A 09/11/2019   Procedure: PORTA CATH INSERTION;  Surgeon: Marea Selinda RAMAN, MD;  Location: [facility] INVASIVE CV LAB;  Service: Cardiovascular;  Laterality: N/A;   PORTA CATH REMOVAL N/A 08/16/2022   Procedure: PORTA CATH REMOVAL;  Surgeon: Marea Selinda RAMAN, MD;  Location: [facility] INVASIVE CV LAB;  Service: Cardiovascular;  Laterality: N/A;   TUBAL LIGATION  1999    Family Psychiatric History: Please see initial evaluation for full details. I have reviewed the history. No updates at this time.     Family History:  Family History  Problem Relation Age of Onset   Heart failure Mother    Atrial fibrillation Mother    Hypertension Mother    Diabetes Father    Heart attack Father    Breast cancer Maternal Aunt 66   Breast cancer Maternal Grandmother 68   Breast cancer Cousin        2 mat cousins   Hypertension Sister    Diabetes Sister    Blindness Sister    Hypertension Sister    Arthritis Sister    Leukemia Paternal Aunt    Leukemia Paternal Uncle    Brain cancer Paternal Aunt    Other Brother 13       Drowning accident   Liver cancer Maternal Uncle    Alcohol abuse Maternal Uncle    Prostate cancer Neg Hx    Kidney cancer Neg Hx    BRCA 1/2 Neg Hx     Social History:  Social History   Socioeconomic History   Marital status: Single    Spouse name: Not on file   Number of children: 0   Years of education: Not on file   Highest education level: Associate degree: occupational, scientist, product/process development, or vocational program  Occupational History   Occupation: Retired  Tobacco Use   Smoking status: Never    Passive exposure: Never   Smokeless tobacco: Never   Tobacco comments:    2bd hand smoke from father  Vaping Use   Vaping status: Never Used  Substance and Sexual Activity   Alcohol use: Never    Comment: rarely   Drug use: Never   Sexual activity: Not Currently    Birth control/protection:  Post-menopausal  Other Topics Concern   Not on file  Social History Narrative   Not on file   Social Drivers of Health   Financial Resource Strain: Low Risk  (12/31/2022)   Overall Financial Resource Strain (CARDIA)    Difficulty of Paying Living Expenses: Not hard at all  Food Insecurity: No Food Insecurity (12/31/2022)   Hunger  Vital Sign    Worried About Programme Researcher, Broadcasting/film/video in the Last Year: Never true    Ran Out of Food in the Last Year: Never true  Transportation Needs: No Transportation Needs (12/31/2022)   PRAPARE - Administrator, Civil Service (Medical): No    Lack of Transportation (Non-Medical): No  Physical Activity: Unknown (12/31/2022)   Exercise Vital Sign    Days of Exercise per Week: 0 days    Minutes of Exercise per Session: Not on file  Recent Concern: Physical Activity - Inactive (12/31/2022)   Exercise Vital Sign    Days of Exercise per Week: 0 days    Minutes of Exercise per Session: 0 min  Stress: No Stress Concern Present (12/31/2022)   [employer] of Occupational Health - Occupational Stress Questionnaire    Feeling of Stress : Not at all  Social Connections: Unknown (12/31/2022)   Social Connection and Isolation Panel [NHANES]    Frequency of Communication with Friends and Family: Three times a week    Frequency of Social Gatherings with Friends and Family: Once a week    Attends Religious Services: Patient declined    Database Administrator or Organizations: Patient declined    Attends Banker Meetings: Not on file    Marital Status: Never married    Allergies:  Allergies  Allergen Reactions   Citalopram  Nausea And Vomiting   Ivp Dye [Iodinated Contrast Media] Hives and Swelling    hives   Meloxicam     unknown   Adhesive [Tape] Rash   Amlodipine Rash   Ampicillin Rash    Did it involve swelling of the face/tongue/throat, SOB, or low BP? No Did it involve sudden or severe rash/hives, skin peeling, or any reaction on  the inside of your mouth or nose? Yes Did you need to seek medical attention at a hospital or doctor's office? Yes When did it last happen?       10 + years If all above answers are "NO", may proceed with cephalosporin use.   Cefoxitin Rash   Chlorhexidine Rash    Chloraprep Scrub Teal   Estradiol Rash   Estropipate Rash   Olmesartan Medoxomil-Hctz Rash   Sertraline Rash        Sulfa Antibiotics Rash    Metabolic Disorder Labs: No results found for: HGBA1C, MPG No results found for: PROLACTIN Lab Results  Component Value Date   CHOL 188 09/02/2017   TRIG 131 09/02/2017   HDL 53 09/02/2017   CHOLHDL 3.5 09/02/2017   VLDL 26 09/02/2017   LDLCALC 109 (H) 09/02/2017   Lab Results  Component Value Date   TSH 0.925 03/08/2023   TSH 1.535 07/19/2021    Therapeutic Level Labs: No results found for: LITHIUM No results found for: VALPROATE No results found for: CBMZ  Current Medications: Current Outpatient Medications  Medication Sig Dispense Refill   acetaminophen  (TYLENOL ) 500 MG tablet Take 1,000 mg by mouth every 6 (six) hours as needed (for pain.).     buPROPion  (WELLBUTRIN  XL) 150 MG 24 hr tablet Take 1 tablet (150 mg total) by mouth daily. 30 tablet 1   diclofenac  (VOLTAREN ) 75 MG EC tablet Take 1 tablet (75 mg total) by mouth 2 (two) times daily. 30 tablet 0   fexofenadine-pseudoephedrine (ALLEGRA-D 24) 180-240 MG 24 hr tablet Take 1 tablet by mouth daily.     loratadine (CLARITIN) 10 MG tablet Take 10 mg by mouth daily. otc  LORazepam  (ATIVAN ) 0.5 MG tablet Take 0.5 mg by mouth daily.     LORazepam  (ATIVAN ) 0.5 MG tablet Take 1 tablet (0.5 mg total) by mouth daily as needed for anxiety. 30 tablet 1   mirabegron  ER (MYRBETRIQ ) 50 MG TB24 tablet Take 1 tablet (50 mg total) by mouth daily. 30 tablet 11   omeprazole (PRILOSEC) 10 MG capsule Take 10 mg by mouth daily.     Polyethylene Glycol 3350  (MIRALAX  PO) Take by mouth.     pregabalin  (LYRICA ) 75 MG  capsule TAKE 1 CAPSULE BY MOUTH TWICE A DAY 60 capsule 0   Probiotic Product (UP4 PROBIOTICS WOMENS PO) Take 1 tablet by mouth daily.     triamcinolone  (KENALOG ) 0.025 % cream Apply topically 2 (two) times daily.     XYZAL ALLERGY 24HR 5 MG tablet Take 10 mg by mouth daily.     No current facility-administered medications for this visit.   Facility-Administered Medications Ordered in Other Visits  Medication Dose Route Frequency Provider Last Rate Last Admin   sodium chloride  flush (NS) 0.9 % injection 10 mL  10 mL Intravenous PRN Rao, Archana C, MD   10 mL at 01/05/20 0750     Musculoskeletal: Strength & Muscle Tone: within normal limits Gait & Station: normal Patient leans: N/A  Psychiatric Specialty Exam: Review of Systems  Psychiatric/Behavioral:  Positive for dysphoric mood. Negative for agitation, behavioral problems, confusion, decreased concentration, hallucinations, self-injury, sleep disturbance and suicidal ideas. The patient is nervous/anxious. The patient is not hyperactive.   All other systems reviewed and are negative.   Blood pressure 117/75, pulse 73, temperature (!) 96.1 F (35.6 C), temperature source Skin, height 5' 2 (1.575 m).Body mass index is 34.93 kg/m.  General Appearance: Well Groomed  Eye Contact:  Good  Speech:  Clear and Coherent  Volume:  Normal  Mood:  Depressed  Affect:  Appropriate, Congruent, and Tearful  Thought Process:  Coherent  Orientation:  Full (Time, Place, and Person)  Thought Content: Logical   Suicidal Thoughts:  No  Homicidal Thoughts:  No  Memory:  Immediate;   Good  Judgement:  Good  Insight:  Good  Psychomotor Activity:  Normal  Concentration:  Concentration: Good and Attention Span: Good  Recall:  Good  Fund of Knowledge: Good  Language: Good  Akathisia:  No  Handed:  Right  AIMS (if indicated): not done  Assets:  Communication Skills Desire for Improvement  ADL's:  Intact  Cognition: WNL  Sleep:  Fair    Screenings: GAD-7    Flowsheet Row Office Visit from 08/05/2023 in [facility] Office Visit from 03/21/2023 in [facility] at [facility] Office Visit from 03/08/2023 in [facility] at [facility] Office Visit from 11/13/2022 in [facility] at [facility] Office Visit from 08/27/2022 in [facility]  Total GAD-7 Score 7 0 0 0 2      PHQ2-9    Flowsheet Row Office Visit from 08/05/2023 in [facility] Office Visit from 03/21/2023 in [facility] at [facility] Office Visit from 03/08/2023 in [facility] at [facility] Office Visit from 11/13/2022 in [facility] & [facility] Medicine at [facility] Office Visit from 08/27/2022 in [facility]  PHQ-2 Total Score 3 0 0  0 0  PHQ-9 Total Score 12 0 0 0 --      Flowsheet Row Office Visit from 08/27/2022 in [facility] Office Visit from 06/25/2022 in [facility] Office Visit from 01/08/2022 in [facility]  C-SSRS RISK CATEGORY No Risk No Risk No Risk        Assessment and Plan:  Carrie Roberson is a 78 y.o. 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   1. MDD (major depressive disorder), recurrent episode, moderate (HCC) 2. Generalized anxiety disorder Acute stressors include: weight gain Other stressors include: conflict with her sister, loss of her mother, her brother age 29 from drowning, sister in [place], history of ovarian  cancer    History:    There has been worsening in depressive symptoms and anxiety since the last visit. She has fallen out of her daily routine and spends most of her time watching TV or eating. Behavioral activation strategies were introduced and coached to help re-establish structure and engagement in meaningful activities.  She is willing to try bupropion  again to target depression as she is unsure if she has side effects from this medication.  Will restart lorazepam  as needed for anxiety.  Discussed potential risk of drowsiness, confusion, respiratory suppression with concomitant use of pregabalin .  She is aware that this medication will be used only for short term (previously tolerated without adverse reaction).  It was noted that she has a tendency to feel guilt, which traces back to the loss of her younger brother.  She will greatly benefit from CBT; will make referral.   # binge eating She reports significant worsening in binge eating.  Will start topiramate  to target this.  Discussed potential risk of drowsiness.  She is advised to start this medication after a one-week trial of bupropion  to help determine which medication is causing specific side effects.     Plan Start bupropion  150 mg daily  Start topiramate  25 mg at night after starting bupropion  for a week Continue lorazepam  0.5 mg daily as needed for anxiety Referral to therapy  Next appointment- 2/24 at 11:30, IP  (Try melatonin 3 mg at night, 2 hours before going to the bed) - on pregabalin  75 mg twice a day for neuropathy  Past trials of medication: she does not recall- sertraline (rash), citalopram  (nausea), venlafaxine  (dizziness from 75 mg)    The patient demonstrates the following risk factors for suicide: Chronic risk factors for suicide include: psychiatric disorder of depression, anxiety . Acute risk factors for suicide include: unemployment and loss (financial, interpersonal, professional). Protective factors for this  patient include: positive social support, coping skills, and hope for the future. Considering these factors, the overall suicide risk at this point appears to be low. Patient is appropriate for outpatient follow up.     Collaboration of Care: Collaboration of Care: Other reviewed notes in Epic  Patient/Guardian was advised Release of Information must be obtained prior to any record release in order to collaborate their care with an outside provider. Patient/Guardian was advised if they have not already done so to contact the registration department to sign all necessary forms in order for us  to release information regarding their care.   Consent: Patient/Guardian gives verbal consent for treatment and assignment of benefits for  services provided during this visit. Patient/Guardian expressed understanding and agreed to proceed.    Katheren Sleet, MD 08/05/2023, 12:48 PM

## 2023-08-02 ENCOUNTER — Other Ambulatory Visit: Payer: Self-pay | Admitting: Oncology

## 2023-08-05 ENCOUNTER — Ambulatory Visit (INDEPENDENT_AMBULATORY_CARE_PROVIDER_SITE_OTHER): Payer: Medicare Other | Admitting: Psychiatry

## 2023-08-05 ENCOUNTER — Encounter: Payer: Self-pay | Admitting: Psychiatry

## 2023-08-05 VITALS — BP 117/75 | HR 73 | Temp 96.1°F | Ht 62.0 in

## 2023-08-05 DIAGNOSIS — F330 Major depressive disorder, recurrent, mild: Secondary | ICD-10-CM

## 2023-08-05 DIAGNOSIS — R632 Polyphagia: Secondary | ICD-10-CM | POA: Diagnosis not present

## 2023-08-05 DIAGNOSIS — F411 Generalized anxiety disorder: Secondary | ICD-10-CM

## 2023-08-05 MED ORDER — LORAZEPAM 0.5 MG PO TABS: 0.5000 mg | ORAL_TABLET | Freq: Every day | ORAL | 1 refills | Status: AC | PRN

## 2023-08-05 MED ORDER — BUPROPION HCL ER (XL) 150 MG PO TB24: 150.0000 mg | ORAL_TABLET | Freq: Every day | ORAL | 1 refills | Status: DC

## 2023-08-05 NOTE — Patient Instructions (Signed)
 Start bupropion 150 mg daily  Start topiramate 25 mg at night after starting bupropion for a week Continue lorazepam 0.5 mg daily as needed for anxiety Referral to therapy  Next appointment- 2/24 at 11:30

## 2023-08-09 ENCOUNTER — Other Ambulatory Visit: Payer: Self-pay | Admitting: Family Medicine

## 2023-08-09 DIAGNOSIS — I10 Essential (primary) hypertension: Secondary | ICD-10-CM

## 2023-08-09 NOTE — Telephone Encounter (Signed)
Requested Prescriptions  Refused Prescriptions Disp Refills   lisinopril (ZESTRIL) 5 MG tablet [Pharmacy Med Name: LISINOPRIL 5 MG TABLET] 90 tablet 0    Sig: TAKE 1 TABLET (5 MG TOTAL) BY MOUTH DAILY.     Cardiovascular:  ACE Inhibitors Passed - 08/09/2023  3:28 PM      Passed - Cr in normal range and within 180 days    Creatinine, Ser  Date Value Ref Range Status  03/22/2023 0.73 0.44 - 1.00 mg/dL Final         Passed - K in normal range and within 180 days    Potassium  Date Value Ref Range Status  03/22/2023 4.4 3.5 - 5.1 mmol/L Final         Passed - Patient is not pregnant      Passed - Last BP in normal range    BP Readings from Last 1 Encounters:  07/08/23 138/74         Passed - Valid encounter within last 6 months    Recent Outpatient Visits           4 months ago Essential hypertension   [place] [facility]   5 months ago Refractory chronic cough   [place] [facility]   7 months ago Primary osteoarthritis of left knee   [facility] at [facility], Ocie Bob, MD   7 months ago Primary osteoarthritis of left knee   [facility] at [facility]   8 months ago Acute non-recurrent maxillary sinusitis   [place] [facility] at [facility]       Future Appointments             In 11 months Janalee Dane [facility] [place]

## 2023-08-12 ENCOUNTER — Other Ambulatory Visit: Payer: Self-pay | Admitting: Psychiatry

## 2023-08-12 MED ORDER — TOPIRAMATE 25 MG PO CPSP: 25.0000 mg | ORAL_CAPSULE | Freq: Every day | ORAL | 1 refills | Status: DC

## 2023-08-14 ENCOUNTER — Ambulatory Visit: Payer: Medicare Other | Admitting: Licensed Clinical Social Worker

## 2023-08-21 DIAGNOSIS — L508 Other urticaria: Secondary | ICD-10-CM | POA: Diagnosis not present

## 2023-08-27 ENCOUNTER — Ambulatory Visit: Payer: Self-pay | Admitting: Psychiatry

## 2023-08-28 ENCOUNTER — Inpatient Hospital Stay: Payer: Self-pay

## 2023-08-28 ENCOUNTER — Inpatient Hospital Stay: Payer: Self-pay | Attending: Oncology

## 2023-08-28 ENCOUNTER — Other Ambulatory Visit: Payer: Self-pay

## 2023-08-28 DIAGNOSIS — Z08 Encounter for follow-up examination after completed treatment for malignant neoplasm: Secondary | ICD-10-CM

## 2023-08-28 DIAGNOSIS — E611 Iron deficiency: Secondary | ICD-10-CM | POA: Diagnosis not present

## 2023-08-28 DIAGNOSIS — Z8543 Personal history of malignant neoplasm of ovary: Secondary | ICD-10-CM | POA: Diagnosis not present

## 2023-08-28 LAB — CBC
HCT: 46.3 % — ABNORMAL HIGH (ref 36.0–46.0)
Hemoglobin: 15.7 g/dL — ABNORMAL HIGH (ref 12.0–15.0)
MCH: 30 pg (ref 26.0–34.0)
MCHC: 33.9 g/dL (ref 30.0–36.0)
MCV: 88.5 fL (ref 80.0–100.0)
Platelets: 224 10*3/uL (ref 150–400)
RBC: 5.23 MIL/uL — ABNORMAL HIGH (ref 3.87–5.11)
RDW: 13.1 % (ref 11.5–15.5)
WBC: 3.5 10*3/uL — ABNORMAL LOW (ref 4.0–10.5)
nRBC: 0 % (ref 0.0–0.2)

## 2023-08-28 LAB — IRON AND TIBC
Iron: 103 ug/dL (ref 28–170)
Saturation Ratios: 29 % (ref 10.4–31.8)
TIBC: 358 ug/dL (ref 250–450)
UIBC: 255 ug/dL

## 2023-08-28 LAB — FERRITIN: Ferritin: 35 ng/mL (ref 11–307)

## 2023-08-29 ENCOUNTER — Encounter: Payer: Self-pay | Admitting: Oncology

## 2023-08-29 LAB — CA 125: Cancer Antigen (CA) 125: 7.9 U/mL (ref 0.0–38.1)

## 2023-09-02 ENCOUNTER — Ambulatory Visit: Payer: Medicare Other | Admitting: Licensed Clinical Social Worker

## 2023-09-02 ENCOUNTER — Other Ambulatory Visit: Payer: Self-pay | Admitting: Oncology

## 2023-09-04 ENCOUNTER — Other Ambulatory Visit: Payer: Self-pay | Admitting: Psychiatry

## 2023-09-04 DIAGNOSIS — L508 Other urticaria: Secondary | ICD-10-CM | POA: Diagnosis not present

## 2023-09-09 NOTE — Progress Notes (Signed)
 [facility] MD/PA/NP OP Progress Note  09/16/2023 12:26 PM Velora Horstman  MRN:  [id number]  Chief Complaint:  Chief Complaint  Patient presents with   Follow-up   HPI:  This is a follow-up appointment for depression, anxiety.  She states that she could not continue bupropion due to dizziness.  She thinks she needs something and it is obvious.  Although she knows that the cancer is not coming, the thought is still there.There is a "turmoil" in the family, and she feels frustrated with her extended family.  She misses her friends back home.  These thoughts are "prevalent."  She has been trying to do sewing and alteration to have a good feeling of accomplishment.  She needs to work on Christmas decoration.  She tends to eat even when she is not hungry.  She has insomnia.  She feels depressed.  She denies SI.  She denies alcohol use or drug use.  She took lorazepam a few times since our last appointment, including the time she had adverse reaction from bupropion.  She agrees with the plan as outlined below.   Wt Readings from Last 3 Encounters:  09/16/23 191 lb 6.4 oz (86.8 kg)  07/08/23 191 lb (86.6 kg)  03/22/23 187 lb 12.8 oz (85.2 kg)     Household: sister, two dogs Marital status: single Number of children: 0 Employment: retired in 2002, Designer, multimedia:   She was raised in [place]. She has three siblings. Her brother deceased from drowning accident at age 64. She was 78 year old that time. She states that she thinks about him every day, and has never passed it.  She was taking care of her mother, who passed away at age 79. Although she misses her mother, she thinks it was better for her as her mother did not have good quality of life later in the course.  Visit Diagnosis:    ICD-10-CM   1. MDD (major depressive disorder), recurrent episode, mild (HCC)  F33.0     2. Generalized anxiety disorder  F41.1     3. High risk medication use  Z79.899 Urine Drug Panel 7      Past Psychiatric  History: Please see initial evaluation for full details. I have reviewed the history. No updates at this time.     Past Medical History:  Past Medical History:  Diagnosis Date   Anxiety    Arthritis    Diverticulitis    Family history of breast cancer    Family history of leukemia    GERD (gastroesophageal reflux disease)    History of ischemic colitis 09/02/2017   Hx of colonic polyp    Hypertension    H/O-PT TAKEN OFF BY PCP AS OF 2019   IBS (irritable bowel syndrome)    Ovarian cancer (HCC)    Ovarian cyst    Personal history of chemotherapy 2021   6 treatments   Vaginal atrophy     Past Surgical History:  Procedure Laterality Date   ABDOMINAL HYSTERECTOMY     BREAST BIOPSY Left    needle bx/clip-neg   BREAST BIOPSY Right 06/28/2021   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   CERVICAL BIOPSY  W/ LOOP ELECTRODE EXCISION     CHOLECYSTECTOMY     Colonoscopoy  2007, 2008, 2011   COLONOSCOPY WITH PROPOFOL N/A 05/05/2018   Procedure: COLONOSCOPY WITH PROPOFOL;  Surgeon: Toney Reil, MD;  Location: [facility] ENDOSCOPY;  Service: Gastroenterology;  Laterality: N/A;   FLEXIBLE SIGMOIDOSCOPY N/A  07/20/2021   Procedure: FLEXIBLE SIGMOIDOSCOPY;  Surgeon: Regis Bill, MD;  Location: [facility] ENDOSCOPY;  Service: Endoscopy;  Laterality: N/A;   HYSTERECTOMY ABDOMINAL WITH SALPINGO-OOPHORECTOMY N/A 08/19/2019   Procedure: HYSTERECTOMY ABDOMINAL WITH SALPINGO-OOPHORECTOMY WITH PELVIC WASHINGS PERITONEAL BIOPSIES AND PARA AORTIC LYMPH NODE DISSECTION;  Surgeon: Vena Austria, MD;  Location: [facility] ORS;  Service: Gynecology;  Laterality: N/A;   LEEP     OMENTECTOMY N/A 08/19/2019   Procedure: OMENTECTOMY;  Surgeon: Vena Austria, MD;  Location: [facility] ORS;  Service: Gynecology;  Laterality: N/A;   OOPHORECTOMY     PORTA CATH INSERTION N/A 09/11/2019   Procedure: PORTA CATH INSERTION;  Surgeon: Annice Needy, MD;  Location: [facility] INVASIVE CV LAB;  Service: Cardiovascular;   Laterality: N/A;   PORTA CATH REMOVAL N/A 08/16/2022   Procedure: PORTA CATH REMOVAL;  Surgeon: Annice Needy, MD;  Location: [facility] INVASIVE CV LAB;  Service: Cardiovascular;  Laterality: N/A;   TUBAL LIGATION  1999    Family Psychiatric History: Please see initial evaluation for full details. I have reviewed the history. No updates at this time.     Family History:  Family History  Problem Relation Age of Onset   Heart failure Mother    Atrial fibrillation Mother    Hypertension Mother    Diabetes Father    Heart attack Father    Breast cancer Maternal Aunt 37   Breast cancer Maternal Grandmother 27   Breast cancer Cousin        2 mat cousins   Hypertension Sister    Diabetes Sister    Blindness Sister    Hypertension Sister    Arthritis Sister    Leukemia Paternal Aunt    Leukemia Paternal Uncle    Brain cancer Paternal Aunt    Other Brother 13       Drowning accident   Liver cancer Maternal Uncle    Alcohol abuse Maternal Uncle    Prostate cancer Neg Hx    Kidney cancer Neg Hx    BRCA 1/2 Neg Hx     Social History:  Social History   Socioeconomic History   Marital status: Single    Spouse name: Not on file   Number of children: 0   Years of education: Not on file   Highest education level: Associate degree: occupational, Scientist, product/process development, or vocational program  Occupational History   Occupation: Retired  Tobacco Use   Smoking status: Never    Passive exposure: Never   Smokeless tobacco: Never   Tobacco comments:    2bd hand smoke from father  Vaping Use   Vaping status: Never Used  Substance and Sexual Activity   Alcohol use: Never    Comment: rarely   Drug use: Never   Sexual activity: Not Currently    Birth control/protection: Post-menopausal  Other Topics Concern   Not on file  Social History Narrative   Not on file   Social Drivers of Health   Financial Resource Strain: Low Risk  (12/31/2022)   Overall Financial Resource Strain (CARDIA)     Difficulty of Paying Living Expenses: Not hard at all  Food Insecurity: No Food Insecurity (12/31/2022)   Hunger Vital Sign    Worried About Running Out of Food in the Last Year: Never true    Ran Out of Food in the Last Year: Never true  Transportation Needs: No Transportation Needs (12/31/2022)   PRAPARE - Transportation    Lack of Transportation (Medical): No    Lack  of Transportation (Non-Medical): No  Physical Activity: Unknown (12/31/2022)   Exercise Vital Sign    Days of Exercise per Week: 0 days    Minutes of Exercise per Session: Not on file  Recent Concern: Physical Activity - Inactive (12/31/2022)   Exercise Vital Sign    Days of Exercise per Week: 0 days    Minutes of Exercise per Session: 0 min  Stress: No Stress Concern Present (12/31/2022)   [employer] of Occupational Health - Occupational Stress Questionnaire    Feeling of Stress : Not at all  Social Connections: Unknown (12/31/2022)   Social Connection and Isolation Panel [NHANES]    Frequency of Communication with Friends and Family: Three times a week    Frequency of Social Gatherings with Friends and Family: Once a week    Attends Religious Services: Patient declined    Database administrator or Organizations: Patient declined    Attends Banker Meetings: Not on file    Marital Status: Never married    Allergies:  Allergies  Allergen Reactions   Citalopram Nausea And Vomiting   Ivp Dye [Iodinated Contrast Media] Hives and Swelling    hives   Meloxicam     unknown   Adhesive [Tape] Rash   Amlodipine Rash   Ampicillin Rash    Did it involve swelling of the face/tongue/throat, SOB, or low BP? No Did it involve sudden or severe rash/hives, skin peeling, or any reaction on the inside of your mouth or nose? Yes Did you need to seek medical attention at a hospital or doctor's office? Yes When did it last happen?       10 + years If all above answers are "NO", may proceed with cephalosporin use.    Cefoxitin Rash   Chlorhexidine Rash    Chloraprep Scrub Teal   Estradiol Rash   Estropipate Rash   Olmesartan Medoxomil-Hctz Rash   Sertraline Rash        Sulfa Antibiotics Rash    Metabolic Disorder Labs: No results found for: "HGBA1C", "MPG" No results found for: "PROLACTIN" Lab Results  Component Value Date   CHOL 188 09/02/2017   TRIG 131 09/02/2017   HDL 53 09/02/2017   CHOLHDL 3.5 09/02/2017   VLDL 26 09/02/2017   LDLCALC 109 (H) 09/02/2017   Lab Results  Component Value Date   TSH 0.925 03/08/2023   TSH 1.535 07/19/2021    Therapeutic Level Labs: No results found for: "LITHIUM" No results found for: "VALPROATE" No results found for: "CBMZ"  Current Medications: Current Outpatient Medications  Medication Sig Dispense Refill   acetaminophen (TYLENOL) 500 MG tablet Take 1,000 mg by mouth every 6 (six) hours as needed (for pain.).     diclofenac (VOLTAREN) 75 MG EC tablet Take 1 tablet (75 mg total) by mouth 2 (two) times daily. 30 tablet 0   fexofenadine-pseudoephedrine (ALLEGRA-D 24) 180-240 MG 24 hr tablet Take 1 tablet by mouth daily.     loratadine (CLARITIN) 10 MG tablet Take 10 mg by mouth daily. otc     LORazepam (ATIVAN) 0.5 MG tablet Take 1 tablet (0.5 mg total) by mouth daily as needed for anxiety. 30 tablet 1   mirabegron ER (MYRBETRIQ) 50 MG TB24 tablet Take 1 tablet (50 mg total) by mouth daily. 30 tablet 11   omeprazole (PRILOSEC) 10 MG capsule Take 10 mg by mouth daily.     Polyethylene Glycol 3350 (MIRALAX PO) Take by mouth.     pregabalin (LYRICA) 75  MG capsule TAKE 1 CAPSULE BY MOUTH TWICE A DAY 60 capsule 0   Probiotic Product (UP4 PROBIOTICS WOMENS PO) Take 1 tablet by mouth daily.     topiramate (TOPAMAX) 25 MG capsule Take 1 capsule (25 mg total) by mouth at bedtime. 90 capsule 0   triamcinolone (KENALOG) 0.025 % cream Apply topically 2 (two) times daily.     Vilazodone HCl (VIIBRYD) 10 MG TABS 5 mg daily for one week, then 10 mg daily 30  tablet 1   XYZAL ALLERGY 24HR 5 MG tablet Take 10 mg by mouth daily.     No current facility-administered medications for this visit.   Facility-Administered Medications Ordered in Other Visits  Medication Dose Route Frequency Provider Last Rate Last Admin   sodium chloride flush (NS) 0.9 % injection 10 mL  10 mL Intravenous PRN Creig Hines, MD   10 mL at 01/05/20 0750     Musculoskeletal: Strength & Muscle Tone: within normal limits Gait & Station: normal Patient leans: N/A  Psychiatric Specialty Exam: Review of Systems  Psychiatric/Behavioral:  Positive for dysphoric mood and sleep disturbance. Negative for agitation, behavioral problems, confusion, decreased concentration, hallucinations, self-injury and suicidal ideas. The patient is nervous/anxious. The patient is not hyperactive.   All other systems reviewed and are negative.   Blood pressure 138/82, pulse 64, temperature (!) 97.5 F (36.4 C), temperature source Skin, height 5\' 2"  (1.575 m), weight 191 lb 6.4 oz (86.8 kg).Body mass index is 35.01 kg/m.  General Appearance: Well Groomed  Eye Contact:  Good  Speech:  Clear and Coherent  Volume:  Normal  Mood:  Depressed  Affect:  Appropriate, Congruent, and Full Range  Thought Process:  Coherent  Orientation:  Full (Time, Place, and Person)  Thought Content: Logical   Suicidal Thoughts:  No  Homicidal Thoughts:  No  Memory:  Immediate;   Good  Judgement:  Good  Insight:  Good  Psychomotor Activity:  Normal  Concentration:  Concentration: Good and Attention Span: Good  Recall:  Good  Fund of Knowledge: Good  Language: Good  Akathisia:  No  Handed:  Right  AIMS (if indicated): not done  Assets:  Communication Skills Desire for Improvement  ADL's:  Intact  Cognition: WNL  Sleep:  Poor   Screenings: GAD-7    Flowsheet Row Office Visit from 08/05/2023 in [facility] Office Visit from 03/21/2023 in [facility]  & [facility] at [facility] Office Visit from 03/08/2023 in [facility] & [facility] at [facility] Office Visit from 11/13/2022 in [facility] Primary Care & Sports Medicine at [facility] Office Visit from 08/27/2022 in [facility]  Total GAD-7 Score 7 0 0 0 2      PHQ2-9    Flowsheet Row Office Visit from 08/05/2023 in [facility] Office Visit from 03/21/2023 in [facility] Care & Sports Medicine at [facility] Office Visit from 03/08/2023 in [facility] Primary Care & Sports Medicine at [facility] Office Visit from 11/13/2022 in [facility] Care & Sports Medicine at [facility] Office Visit from 08/27/2022 in [facility]  PHQ-2 Total Score 3 0 0 0 0  PHQ-9 Total Score 12 0 0 0 --      Flowsheet Row Office Visit from 08/27/2022 in [facility] Office Visit from 06/25/2022 in [facility] Office Visit from 01/08/2022 in [facility]  C-SSRS RISK CATEGORY No Risk No Risk No Risk        Assessment and Plan:  Annelise Mccoy is a 78 y.o. 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   1. MDD (major depressive disorder), recurrent episode, mild (HCC) 2. Generalized anxiety disorder Acute stressors include: weight gain Other stressors include: conflict with her sister, loss of her mother, her brother age 25 from drowning, sister in [place], history of ovarian cancer    History:     She continues to experience depressive symptoms and anxiety.  She had adverse reaction of dizziness from bupropion.  Will continue to hold this medication.  Will  start Viibryd to target depression, and anxiety off label.  If this is not approved by her insurance, we will consider restarting lower dose of venlafaxine as it was effective to a certain extent.  Discussed potential risk of nausea.  Will continue lorazepam as needed for anxiety.  She agrees to minimize its use.  Noted that she has tendency to feel guilt, which traces back to the loss of her younger brother. She has an upcoming appointment for psychotherapy.   # Binge eating Unstable.  She denies any adverse reaction from topiramate.  Will try higher dose to optimize treatment for binge eating.   # high risk medication use  Will obtain a urine drug screening   Plan Hold bupropion  Start Viibryd 5 mg daily for one week, then 10 mg daily  Increase topiramate 50 mg at night after starting bupropion for a week (she was advised to take two tabs of 25 mg) Continue lorazepam 0.5 mg daily as needed for anxiety - she took a few tabs only since the last visit (has refill left) Next appointment-  4/21 at 10:30, IP Obtain lab (UDS for screening) (Try melatonin 3 mg at night, 2 hours before going to the bed) - on pregabalin 75 mg twice a day for neuropathy   Past trials of medication: she does not recall- sertraline (rash), citalopram (nausea), venlafaxine (dizziness from 75 mg), bupropion (dizziness)   The patient demonstrates the following risk factors for suicide: Chronic risk factors for suicide include: psychiatric disorder of depression, anxiety . Acute risk factors for suicide include: unemployment and loss (financial, interpersonal, professional). Protective factors for this patient include: positive social support, coping skills, and hope for the future. Considering these factors, the overall suicide risk at this point appears to be low. Patient is appropriate for outpatient follow up.    Collaboration of Care: Collaboration of Care: Other reviewed notes in Epic  Patient/Guardian was advised  Release of Information must be obtained prior to any record release in order to collaborate their care with an outside provider. Patient/Guardian was advised if they have not already done so to contact the registration department to sign all necessary forms in order for [place] to release information regarding their care.   Consent: Patient/Guardian gives verbal consent for treatment and assignment of benefits for services provided during this visit. Patient/Guardian expressed understanding and agreed to proceed.    Neysa Hotter, MD 09/16/2023, 12:26 PM

## 2023-09-16 ENCOUNTER — Ambulatory Visit (INDEPENDENT_AMBULATORY_CARE_PROVIDER_SITE_OTHER): Payer: Medicare Other | Admitting: Psychiatry

## 2023-09-16 ENCOUNTER — Encounter: Payer: Self-pay | Admitting: Psychiatry

## 2023-09-16 VITALS — BP 138/82 | HR 64 | Temp 97.5°F | Ht 62.0 in | Wt 191.4 lb

## 2023-09-16 DIAGNOSIS — Z79899 Other long term (current) drug therapy: Secondary | ICD-10-CM | POA: Diagnosis not present

## 2023-09-16 DIAGNOSIS — F411 Generalized anxiety disorder: Secondary | ICD-10-CM | POA: Diagnosis not present

## 2023-09-16 DIAGNOSIS — F330 Major depressive disorder, recurrent, mild: Secondary | ICD-10-CM | POA: Diagnosis not present

## 2023-09-16 MED ORDER — VILAZODONE HCL 10 MG PO TABS: ORAL_TABLET | ORAL | 1 refills | Status: DC

## 2023-09-16 NOTE — Patient Instructions (Signed)
 Hold bupropion  Start Viibryd 5 mg daily for one week, then 10 mg daily  Increase topiramate 50 mg at night after starting bupropion for a week Continue lorazepam 0.5 mg daily as needed for anxiety  Next appointment-  4/21 at 10:30

## 2023-09-18 ENCOUNTER — Telehealth: Payer: Self-pay | Admitting: Oncology

## 2023-09-18 NOTE — Telephone Encounter (Signed)
 Patient called to cancel upcoming lab appointment on 2/28. She is sick and will call to reschedule when she is better.   Appointment cancelled as requested

## 2023-09-20 ENCOUNTER — Other Ambulatory Visit: Payer: Medicare Other

## 2023-09-24 ENCOUNTER — Ambulatory Visit (INDEPENDENT_AMBULATORY_CARE_PROVIDER_SITE_OTHER): Payer: Medicare Other | Admitting: Licensed Clinical Social Worker

## 2023-09-24 DIAGNOSIS — F330 Major depressive disorder, recurrent, mild: Secondary | ICD-10-CM

## 2023-09-24 DIAGNOSIS — F411 Generalized anxiety disorder: Secondary | ICD-10-CM

## 2023-09-24 NOTE — Progress Notes (Signed)
 Comprehensive Clinical Assessment (CCA) Note  09/24/2023 Carrie Roberson [id number]  Chief Complaint:  Chief Complaint  Patient presents with   Anxiety   Depression   Establish Care   Visit Diagnosis: MDD (major depressive disorder), recurrent episode, mild (HCC)  Generalized anxiety disorder  The patient reports experiencing functional impairments related to various areas, including difficulties with memory, concentration, and problem-solving; obstacles in planning, organizing, or multitasking; issues with judgment, decision-making, and assuming responsibility; a lack of engagement in hobbies or enjoyable activities; and difficulties in regulating mood and affect.   CCA Biopsychosocial Intake/Chief Complaint:  Carrie Roberson is a 78 y.o. year old female who presents alone to [facility] to establish care with therapist with a history of anxiety. The patient was referred by psychiatrist, Dr Vanetta Shawl. Pt has a diagnosis of Gad and MDD.  Current Symptoms/Problems: Patient identifies sxs to support a diagnosis of anxiety and depression. Pt identifies sxs to include but not limited to misplaced guilt, insomnia, difficulty concentrating, uncontrollable worry, anxious feelings, anhedonia, lack of motivation, low mood, tension, and difficulty relaxing. She denies SI and AVH.  She denies alcohol use or drug use.   Patient Reported Schizophrenia/Schizoaffective Diagnosis in Past: No   Strengths: No data recorded Preferences: No data recorded Abilities: No data recorded  Type of Services Patient Feels are Needed: Individual Outpatient Therapy   Initial Clinical Notes/Concerns: No data recorded  Mental Health Symptoms Depression:  No data recorded  Duration of Depressive symptoms: No data recorded  Mania:  None   Anxiety:   No data recorded  Psychosis:  None   Duration of Psychotic symptoms: No data recorded  Trauma:  None   Obsessions:  None   Compulsions:  None   Inattention:  None    Hyperactivity/Impulsivity:  None   Oppositional/Defiant Behaviors:  None   Emotional Irregularity:  Mood lability   Other Mood/Personality Symptoms:  No data recorded   Mental Status Exam Appearance and self-care  Stature:  Average   Weight:  Overweight   Clothing:  Neat/clean   Grooming:  Well-groomed   Cosmetic use:  None   Posture/gait:  Normal   Motor activity:  Not Remarkable   Sensorium  Attention:  Normal   Concentration:  Normal   Orientation:  X5   Recall/memory:  Normal   Affect and Mood  Affect:  Anxious   Mood:  Anxious   Relating  Eye contact:  Normal   Facial expression:  Responsive   Attitude toward examiner:  Cooperative   Thought and Language  Speech flow: Clear and Coherent   Thought content:  Appropriate to Mood and Circumstances   Preoccupation:  None   Hallucinations:  None   Organization:  No data recorded  [employer] of Knowledge:  Average   Intelligence:  Average   Abstraction:  Normal   Judgement:  Fair   Dance movement psychotherapist:  Realistic   Insight:  Good   Decision Making:  Normal   Social Functioning  Social Maturity:  Responsible   Social Judgement:  Normal   Stress  Stressors:  Transitions; Other (Comment) (Health)   Coping Ability:  Normal   Skill Deficits:  Self-care; Self-control   Supports:  Family; Friends/Service system     Religion:    Leisure/Recreation:    Exercise/Diet:     CCA Employment/Education Employment/Work Situation: Employment / Work Situation Employment Situation: Retired Therapist, art is the Longest Time Patient has Held a Job?: 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 Description of patient's relationship with caregiver when they were a child: Reports she was close to her father growing up. Patient's description of current relationship with people who raised him/her: Shares her mother died in 2016-10-05 whom she was very close. Father passed when he was 36. Moved back in with her mother after her father passed. Does patient have siblings?: Yes Number of Siblings: 3 Description of patient's current relationship with siblings: Patient reports she had two sisters; one deceased now and one whom she lives with. Reports they get along for most of time. Reports at 11 her brother died in a drowning accident. Did patient suffer any verbal/emotional/physical/sexual abuse as a child?: No Did patient suffer from severe childhood neglect?: No Has patient ever been sexually abused/assaulted/raped as an adolescent or adult?: No Was the patient ever a victim of a crime or a disaster?: No Witnessed domestic violence?: No Has patient been affected by domestic violence as an adult?: No  Child/Adolescent Assessment:     CCA Substance Use Alcohol/Drug Use: Alcohol / Drug Use Pain Medications: See MAR Prescriptions: See MAR Over the Counter: See MAR History of alcohol / drug use?: No history of alcohol / drug abuse                         ASAM's:  Six Dimensions of Multidimensional Assessment  Dimension 1:  Acute Intoxication and/or Withdrawal Potential:      Dimension 2:  Biomedical Conditions and Complications:      Dimension 3:  Emotional, Behavioral, or Cognitive Conditions and Complications:     Dimension 4:  Readiness to Change:     Dimension 5:  Relapse, Continued use, or Continued Problem Potential:     Dimension 6:  Recovery/Living Environment:     ASAM Severity Score:     ASAM Recommended Level of Treatment:     Substance use Disorder (SUD)    Recommendations for Services/Supports/Treatments: Recommendations for Services/Supports/Treatments Recommendations For Services/Supports/Treatments: Individual Therapy  DSM5 Diagnoses: 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 10/13/2019   Iron deficiency 10/02/2019   Anxiety associated with cancer diagnosis (HCC) 10/02/2019   Chemotherapy-induced nausea 10/02/2019   Thyroid nodule 10/02/2019   Hyponatremia 10/02/2019   B12 deficiency 09/18/2019   Family history of breast cancer    Family history of leukemia    Goals of care, counseling/discussion 09/07/2019   Ovarian mass 08/19/2019   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 year old female who presents alone to [facility] to establish care with therapist with a history of anxiety. The patient was referred by  psychiatrist, Dr Vanetta Shawl. Pt has a diagnosis of Gad and MDD. Patient identifies sxs to support a diagnosis of anxiety and depression. Pt identifies sxs to include but not limited to misplaced guilt, insomnia, difficulty concentrating, uncontrollable worry, anxious feelings, anhedonia, lack of motivation, low mood, tension, and difficulty relaxing. She denies SI and AVH.  She denies alcohol use or drug use.    Stressors: Patient identifies a trauma hx in her childhood of looing her brother from drowning accident at age 71. She was 78 year old at the time. She states that she thinks about him every day and has unresolved grief.  She also identifies grief due to passing of her mother, who passed away at age 61. Pt also shares a medical hx of cancer and describes "survivors' guilt. Shares familial stressors of "turmoil" in the family, and she feels frustrated with her extended family.  Pt moved from [place] and reports missing her friends back home and her sister.    New unmanageable medical conditions that interfere with her ability to manage her sxs through medication. Due to series of medical events patient reports uncontrollable worry. Reports panic attack 2 days ago due to medical sxs. Lead to thoughts about "the cancer creep back in" but identifies she is not fearful of her cancer to return but questions her purpose. Identifies a safety mantra.   Coping with food to the point with the patient defines her eating habits as "binge snacking."   Patient Centered Plan: Patient is on the following Treatment Plan(s):  Anxiety and Depression   Referrals to Alternative Service(s): Referred to Alternative Service(s):   Place:   Date:   Time:    Referred to Alternative Service(s):   Place:   Date:   Time:    Referred to Alternative Service(s):   Place:   Date:   Time:    Referred to Alternative Service(s):   Place:   Date:   Time:      Collaboration of Care: [facility] psychiatrist can access notes and cln.  Will review psychiatrists' notes. Check in with the patient and will see LCSW per availability. Patient agreed with treatment recommendations.   Patient/Guardian was advised Release of Information must be obtained prior to any record release in order to collaborate their care with an outside provider. Patient/Guardian was advised if they have not already done so to contact the registration department to sign all necessary forms in order for [place] to release information regarding their care.   Consent: Patient/Guardian gives verbal consent for treatment and assignment of benefits for services provided during this visit. Patient/Guardian expressed understanding and agreed to proceed.   Dereck Leep, LCSW

## 2023-10-07 ENCOUNTER — Other Ambulatory Visit: Payer: Self-pay | Admitting: Oncology

## 2023-10-08 ENCOUNTER — Other Ambulatory Visit: Payer: Self-pay

## 2023-10-08 MED ORDER — PREGABALIN 75 MG PO CAPS: 75.0000 mg | ORAL_CAPSULE | Freq: Two times a day (BID) | ORAL | 0 refills | Status: DC

## 2023-10-08 MED ORDER — PREGABALIN 75 MG PO CAPS
75.0000 mg | ORAL_CAPSULE | Freq: Two times a day (BID) | ORAL | 0 refills | Status: DC
Start: 2023-10-08 — End: 2023-11-11

## 2023-10-29 ENCOUNTER — Encounter (INDEPENDENT_AMBULATORY_CARE_PROVIDER_SITE_OTHER): Payer: Self-pay

## 2023-10-29 ENCOUNTER — Other Ambulatory Visit: Payer: Self-pay | Admitting: Psychiatry

## 2023-10-29 DIAGNOSIS — F330 Major depressive disorder, recurrent, mild: Secondary | ICD-10-CM

## 2023-10-29 DIAGNOSIS — F411 Generalized anxiety disorder: Secondary | ICD-10-CM | POA: Diagnosis not present

## 2023-10-29 DIAGNOSIS — Z79899 Other long term (current) drug therapy: Secondary | ICD-10-CM | POA: Diagnosis not present

## 2023-10-29 NOTE — Telephone Encounter (Signed)
 Called and discussed with the patient.  She has not taken any psychotropic medication in the past few weeks following a reaction to an injection administered by her dermatologist. The reaction occurred several hours after the injection. She denies experiencing any side effects from Viibryd or other psychotropic medications. She has been taking lorazepam more frequently as she is off other medication.   She agrees with the following.  - Restart Viibryd 10 mg daily - Start Topiramate 25 mg at night for one week, then increase to 50 mg at night - Hold Bupropion - Obtain urine drug screen at [facility].   I've spent a total time of 12 minutes providing service to this patient-generated inquiry in the MyChart message

## 2023-11-03 NOTE — Progress Notes (Signed)
 [facility] MD/PA/NP OP Progress Note  11/11/2023 12:23 PM Carrie Roberson  MRN:  [id number]  Chief Complaint:  Chief Complaint  Patient presents with   Follow-up   HPI:  This is a follow-up appointment for depression, anxiety, binge eating.   She states that she did not start either topiramate  or Viibryd  until she contacted this Clinical research associate.  She was started on medication for allergy.  She had a rash in the same day, and was shaking.  Although her shaking subsided after she took lorazepam , she continued to have rash.  She has been taking topiramate  and Viibryd  for the past few weeks.  She experiences dry mouth.  She also has dyspepsia. She thinks medication is helpful otherwise. She is not worried as she used to. She is not doing binge eating as she is not feeling much anxiety anymore.  She was able to put down Christmas tree.  She is thinking of working on stitch, and feels more motivated for this.  She has insomnia due to nocturia.  She denies SI.  She denies alcohol use or drug use.  She has a clonazepam a few times since the last visit for extreme anxiety.  She had a long fall as she was tangled with the cord. She agrees with the following .   Wt Readings from Last 3 Encounters:  11/11/23 191 lb 9.6 oz (86.9 kg)  09/16/23 191 lb 6.4 oz (86.8 kg)  07/08/23 191 lb (86.6 kg)     Household: sister, two dogs Marital status: single Number of children: 0 Employment: retired in 2002, Designer, multimedia:   She was raised in [place]. She has three siblings. Her brother deceased from drowning accident at age 71. She was 78 year old that time. She states that she thinks about him every day, and has never passed it.  She was taking care of her mother, who passed away at age 80. Although she misses her mother, she thinks it was better for her as her mother did not have good quality of life later in the course.  Visit Diagnosis:    ICD-10-CM   1. MDD (major depressive disorder), recurrent, in partial remission  (HCC)  F33.41     2. GAD (generalized anxiety disorder) [F41.1]  F41.1     3. Binge eating  R63.2       Past Psychiatric History: Please see initial evaluation for full details. I have reviewed the history. No updates at this time.     Past Medical History:  Past Medical History:  Diagnosis Date   Anxiety    Arthritis    Diverticulitis    Family history of breast cancer    Family history of leukemia    GERD (gastroesophageal reflux disease)    History of ischemic colitis 09/02/2017   Hx of colonic polyp    Hypertension    H/O-PT TAKEN OFF BY PCP AS OF 2019   IBS (irritable bowel syndrome)    Ovarian cancer (HCC)    Ovarian cyst    Personal history of chemotherapy 2021   6 treatments   Vaginal atrophy     Past Surgical History:  Procedure Laterality Date   ABDOMINAL HYSTERECTOMY     BREAST BIOPSY Left    needle bx/clip-neg   BREAST BIOPSY Right 06/28/2021   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   CERVICAL BIOPSY  W/ LOOP ELECTRODE EXCISION     CHOLECYSTECTOMY     Colonoscopoy  2007, 2008, 2011   COLONOSCOPY  WITH PROPOFOL  N/A 05/05/2018   Procedure: COLONOSCOPY WITH PROPOFOL ;  Surgeon: Selena Daily, MD;  [facility] ENDOSCOPY;  Service: Gastroenterology;  Laterality: N/A;   FLEXIBLE SIGMOIDOSCOPY N/A 07/20/2021   Procedure: FLEXIBLE SIGMOIDOSCOPY;  Surgeon: Shane Darling, MD;  Location: [facility] ENDOSCOPY;  Service: Endoscopy;  Laterality: N/A;   HYSTERECTOMY ABDOMINAL WITH SALPINGO-OOPHORECTOMY N/A 08/19/2019   Procedure: HYSTERECTOMY ABDOMINAL WITH SALPINGO-OOPHORECTOMY WITH PELVIC WASHINGS PERITONEAL BIOPSIES AND PARA AORTIC LYMPH NODE DISSECTION;  Surgeon: Darl Edu, MD;  Location: [facility] ORS;  Service: Gynecology;  Laterality: N/A;   LEEP     OMENTECTOMY N/A 08/19/2019   Procedure: OMENTECTOMY;  Surgeon: Darl Edu, MD;  Location: [facility] ORS;  Service: Gynecology;  Laterality: N/A;   OOPHORECTOMY     PORTA CATH INSERTION N/A  09/11/2019   Procedure: PORTA CATH INSERTION;  Surgeon: Celso College, MD;  Location: [facility] INVASIVE CV LAB;  Service: Cardiovascular;  Laterality: N/A;   PORTA CATH REMOVAL N/A 08/16/2022   Procedure: PORTA CATH REMOVAL;  Surgeon: Celso College, MD;  Location: [facility] INVASIVE CV LAB;  Service: Cardiovascular;  Laterality: N/A;   TUBAL LIGATION  1999    Family Psychiatric History: Please see initial evaluation for full details. I have reviewed the history. No updates at this time.     Family History:  Family History  Problem Relation Age of Onset   Heart failure Mother    Atrial fibrillation Mother    Hypertension Mother    Diabetes Father    Heart attack Father    Breast cancer Maternal Aunt 76   Breast cancer Maternal Grandmother 33   Breast cancer Cousin        2 mat cousins   Hypertension Sister    Diabetes Sister    Blindness Sister    Hypertension Sister    Arthritis Sister    Leukemia Paternal Aunt    Leukemia Paternal Uncle    Brain cancer Paternal Aunt    Other Brother 13       Drowning accident   Liver cancer Maternal Uncle    Alcohol abuse Maternal Uncle    Prostate cancer Neg Hx    Kidney cancer Neg Hx    BRCA 1/2 Neg Hx     Social History:  Social History   Socioeconomic History   Marital status: Single    Spouse name: Not on file   Number of children: 0   Years of education: Not on file   Highest education level: Associate degree: occupational, Scientist, product/process development, or vocational program  Occupational History   Occupation: Retired  Tobacco Use   Smoking status: Never    Passive exposure: Never   Smokeless tobacco: Never   Tobacco comments:    2bd hand smoke from father  Vaping Use   Vaping status: Never Used  Substance and Sexual Activity   Alcohol use: Never    Comment: rarely   Drug use: Never   Sexual activity: Not Currently    Birth control/protection: Post-menopausal  Other Topics Concern   Not on file  Social History Narrative   Not on file    Social Drivers of Health   Financial Resource Strain: Low Risk  (12/31/2022)   Overall Financial Resource Strain (CARDIA)    Difficulty of Paying Living Expenses: Not hard at all  Food Insecurity: No Food Insecurity (12/31/2022)   Hunger Vital Sign    Worried About Running Out of Food in the Last Year: Never true    Ran Out of  Food in the Last Year: Never true  Transportation Needs: No Transportation Needs (12/31/2022)   PRAPARE - Administrator, Civil Service (Medical): No    Lack of Transportation (Non-Medical): No  Physical Activity: Inactive (12/31/2022)   Exercise Vital Sign    Days of Exercise per Week: 0 days    Minutes of Exercise per Session: 0 min  Stress: No Stress Concern Present (12/31/2022)   [employer] of Occupational Health - Occupational Stress Questionnaire    Feeling of Stress : Not at all  Social Connections: Unknown (12/31/2022)   Social Connection and Isolation Panel [NHANES]    Frequency of Communication with Friends and Family: Three times a week    Frequency of Social Gatherings with Friends and Family: Once a week    Attends Religious Services: Patient declined    Database administrator or Organizations: Patient declined    Attends Banker Meetings: Not on file    Marital Status: Never married    Allergies:  Allergies  Allergen Reactions   Citalopram  Nausea And Vomiting   Ivp Dye [Iodinated Contrast Media] Hives and Swelling    hives   Meloxicam     unknown   Adhesive [Tape] Rash   Amlodipine Rash   Ampicillin Rash    Did it involve swelling of the face/tongue/throat, SOB, or low BP? No Did it involve sudden or severe rash/hives, skin peeling, or any reaction on the inside of your mouth or nose? Yes Did you need to seek medical attention at a hospital or doctor's office? Yes When did it last happen?       10 + years If all above answers are "NO", may proceed with cephalosporin use.   Cefoxitin Rash   Chlorhexidine  Rash    Chloraprep Scrub Teal   Estradiol Rash   Estropipate Rash   Olmesartan Medoxomil-Hctz Rash   Sertraline Rash        Sulfa Antibiotics Rash    Metabolic Disorder Labs: No results found for: "HGBA1C", "MPG" No results found for: "PROLACTIN" Lab Results  Component Value Date   CHOL 188 09/02/2017   TRIG 131 09/02/2017   HDL 53 09/02/2017   CHOLHDL 3.5 09/02/2017   VLDL 26 09/02/2017   LDLCALC 109 (H) 09/02/2017   Lab Results  Component Value Date   TSH 0.925 03/08/2023   TSH 1.535 07/19/2021    Therapeutic Level Labs: No results found for: "LITHIUM" No results found for: "VALPROATE" No results found for: "CBMZ"  Current Medications: Current Outpatient Medications  Medication Sig Dispense Refill   acetaminophen  (TYLENOL ) 500 MG tablet Take 1,000 mg by mouth every 6 (six) hours as needed (for pain.).     diclofenac  (VOLTAREN ) 75 MG EC tablet Take 1 tablet (75 mg total) by mouth 2 (two) times daily. 30 tablet 0   fexofenadine-pseudoephedrine (ALLEGRA-D 24) 180-240 MG 24 hr tablet Take 1 tablet by mouth daily.     loratadine (CLARITIN) 10 MG tablet Take 10 mg by mouth daily. otc     mirabegron  ER (MYRBETRIQ ) 50 MG TB24 tablet Take 1 tablet (50 mg total) by mouth daily. 30 tablet 11   omeprazole (PRILOSEC) 10 MG capsule Take 10 mg by mouth daily.     Polyethylene Glycol 3350  (MIRALAX  PO) Take by mouth.     pregabalin  (LYRICA ) 75 MG capsule TAKE 1 CAPSULE BY MOUTH TWICE A DAY 60 capsule 0   Probiotic Product (UP4 PROBIOTICS WOMENS PO) Take 1 tablet by mouth  daily.     topiramate  (TOPAMAX ) 25 MG capsule Take 1 capsule (25 mg total) by mouth at bedtime. 90 capsule 0   triamcinolone  (KENALOG ) 0.025 % cream Apply topically 2 (two) times daily.     Vilazodone  HCl (VIIBRYD ) 10 MG TABS 5 mg daily for one week, then 10 mg daily 30 tablet 1   XYZAL ALLERGY 24HR 5 MG tablet Take 10 mg by mouth daily.     No current facility-administered medications for this visit.    Facility-Administered Medications Ordered in Other Visits  Medication Dose Route Frequency Provider Last Rate Last Admin   sodium chloride  flush (NS) 0.9 % injection 10 mL  10 mL Intravenous PRN Rao, Archana C, MD   10 mL at 01/05/20 0750     Musculoskeletal: Strength & Muscle Tone: within normal limits Gait & Station: normal Patient leans: N/A  Psychiatric Specialty Exam: Review of Systems  Psychiatric/Behavioral:  Positive for dysphoric mood and sleep disturbance. Negative for agitation, behavioral problems, confusion, decreased concentration, hallucinations, self-injury and suicidal ideas. The patient is nervous/anxious. The patient is not hyperactive.   All other systems reviewed and are negative.   Blood pressure 138/86, temperature 98.4 F (36.9 C), temperature source Temporal, height 5\' 2"  (1.575 m), weight 191 lb 9.6 oz (86.9 kg).Body mass index is 35.04 kg/m.  General Appearance: Well Groomed  Eye Contact:  Good  Speech:  Clear and Coherent  Volume:  Normal  Mood:   better  Affect:  Appropriate, Congruent, and Full Range  Thought Process:  Coherent  Orientation:  Full (Time, Place, and Person)  Thought Content: Logical   Suicidal Thoughts:  No  Homicidal Thoughts:  No  Memory:  Immediate;   Good  Judgement:  Good  Insight:  Good  Psychomotor Activity:  Normal  Concentration:  Concentration: Good and Attention Span: Good  Recall:  Good  Fund of Knowledge: Good  Language: Good  Akathisia:  No  Handed:  Right  AIMS (if indicated): not done  Assets:  Communication Skills Desire for Improvement  ADL's:  Intact  Cognition: WNL  Sleep:  Fair   Screenings: GAD-7    Garment/textile technologist Visit from 08/05/2023 in [facility] Office Visit from 03/21/2023 in [facility] at [facility] Office Visit from 03/08/2023 in [facility] at [facility] Office Visit  from 11/13/2022 in [facility] Medicine at [facility] Office Visit from 08/27/2022 in [facility]  Total GAD-7 Score 7 0 0 0 2      PHQ2-9    Flowsheet Row Office Visit from 08/05/2023 in [facility] Office Visit from 03/21/2023 in [facility] at [facility] Office Visit from 03/08/2023 in [facility] at [facility] Office Visit from 11/13/2022 in [facility] at [facility] Office Visit from 08/27/2022 in [facility]  PHQ-2 Total Score 3 0 0 0 0  PHQ-9 Total Score 12 0 0 0 --      Flowsheet Row Office Visit from 08/27/2022 in [facility] Office Visit from 06/25/2022 in [facility] Office Visit from 01/08/2022 in [facility]  C-SSRS RISK CATEGORY No Risk No Risk No Risk  Assessment and Plan:  Elmire Amrein is a 78 y.o. 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   1. MDD (major depressive disorder), recurrent, in partial remission (HCC) 2. GAD (generalized anxiety disorder) [F41.1] Acute stressors include: weight gain Other stressors include: conflict with her sister, loss of her mother, her brother age 25 from drowning, sister in [place], history of ovarian cancer    History: (UDS negative 10/2023)     Although there has been an improvement in depressive symptoms and anxiety since starting Viibryd , she experiences adverse reaction of nausea, and xerostomia.  She agrees to ensure taking medication with meals to see how it improves.  She feels comfortable to stay on  the current dose for now especially given the medication has been helpful.  Will consider lowering the dose if she continues to experience these, or switching to lower dose of venlafaxine  as it was effective to a certain extent.  Will continue lorazepam  as needed for anxiety. She agrees to minimize its use to avoid risk of dizziness, fall, confusion and dependence.   3. Binge eating Improving.  She reports pattern of binge eating especially when she feels anxious, which has been improving.  Will hold topiramate  for now to avoid for the pharmacy, although this medication can be restarted in the future if any worsening.    Plan Continue Viibryd  10 mg at night  Hold topiramate  (briefly tried 50 mg) Continue lorazepam  0.5 mg daily as needed for anxiety  Next appointment-  6/3 at 10:30, IP (Try melatonin 3 mg at night, 2 hours before going to the bed) - on pregabalin  75 mg twice a day for neuropathy   Past trials of medication: she does not recall- sertraline (rash), citalopram  (nausea), venlafaxine  (dizziness from 75 mg), bupropion  (dizziness)   The patient demonstrates the following risk factors for suicide: Chronic risk factors for suicide include: psychiatric disorder of depression, anxiety . Acute risk factors for suicide include: unemployment and loss (financial, interpersonal, professional). Protective factors for this patient include: positive social support, coping skills, and hope for the future. Considering these factors, the overall suicide risk at this point appears to be low. Patient is appropriate for outpatient follow up.   Collaboration of Care: Collaboration of Care: Other reviewed notes in Epic  Patient/Guardian was advised Release of Information must be obtained prior to any record release in order to collaborate their care with an outside provider. Patient/Guardian was advised if they have not already done so to contact the registration department to sign all necessary forms in  order for us  to release information regarding their care.   Consent: Patient/Guardian gives verbal consent for treatment and assignment of benefits for services provided during this visit. Patient/Guardian expressed understanding and agreed to proceed.    Todd Fossa, MD 11/11/2023, 12:23 PM

## 2023-11-05 DIAGNOSIS — Z79899 Other long term (current) drug therapy: Secondary | ICD-10-CM | POA: Diagnosis not present

## 2023-11-06 ENCOUNTER — Encounter: Payer: Self-pay | Admitting: Psychiatry

## 2023-11-06 LAB — MONITOR DRUG PROFILE 10(MW)
Amphetamine Scrn, Ur: NEGATIVE ng/mL
BARBITURATE SCREEN URINE: NEGATIVE ng/mL
BENZODIAZEPINE SCREEN, URINE: NEGATIVE ng/mL
CANNABINOIDS UR QL SCN: NEGATIVE ng/mL
Cocaine (Metab) Scrn, Ur: NEGATIVE ng/mL
Creatinine(Crt), U: 47.4 mg/dL (ref 20.0–300.0)
Methadone Screen, Urine: NEGATIVE ng/mL
OXYCODONE+OXYMORPHONE UR QL SCN: NEGATIVE ng/mL
Opiate Scrn, Ur: NEGATIVE ng/mL
Ph of Urine: 6.3 (ref 4.5–8.9)
Phencyclidine Qn, Ur: NEGATIVE ng/mL
Propoxyphene Scrn, Ur: NEGATIVE ng/mL

## 2023-11-07 ENCOUNTER — Ambulatory Visit: Admitting: Licensed Clinical Social Worker

## 2023-11-08 ENCOUNTER — Other Ambulatory Visit: Payer: Self-pay | Admitting: Oncology

## 2023-11-11 ENCOUNTER — Encounter: Payer: Self-pay | Admitting: Psychiatry

## 2023-11-11 ENCOUNTER — Ambulatory Visit (INDEPENDENT_AMBULATORY_CARE_PROVIDER_SITE_OTHER): Payer: Medicare Other | Admitting: Psychiatry

## 2023-11-11 VITALS — BP 138/86 | Temp 98.4°F | Ht 62.0 in | Wt 191.6 lb

## 2023-11-11 DIAGNOSIS — F3341 Major depressive disorder, recurrent, in partial remission: Secondary | ICD-10-CM

## 2023-11-11 DIAGNOSIS — F411 Generalized anxiety disorder: Secondary | ICD-10-CM

## 2023-11-11 DIAGNOSIS — R632 Polyphagia: Secondary | ICD-10-CM | POA: Diagnosis not present

## 2023-11-11 NOTE — Patient Instructions (Signed)
 Continue Viibryd  10 mg at night  Hold topiramate   Continue lorazepam  0.5 mg daily as needed for anxiety  Next appointment-  6/3 at 10:30

## 2023-11-17 MED ORDER — BUSPIRONE HCL 5 MG PO TABS: 5.0000 mg | ORAL_TABLET | Freq: Two times a day (BID) | ORAL | 1 refills | Status: AC

## 2023-11-19 ENCOUNTER — Ambulatory Visit (INDEPENDENT_AMBULATORY_CARE_PROVIDER_SITE_OTHER): Admitting: Licensed Clinical Social Worker

## 2023-11-19 DIAGNOSIS — F411 Generalized anxiety disorder: Secondary | ICD-10-CM | POA: Diagnosis not present

## 2023-11-19 DIAGNOSIS — F3341 Major depressive disorder, recurrent, in partial remission: Secondary | ICD-10-CM | POA: Diagnosis not present

## 2023-11-19 NOTE — Progress Notes (Signed)
 THERAPIST PROGRESS NOTE  Session Time: 1-1:57pm  Participation Level: Active  Behavioral Response: CasualAlertEuthymic  Type of Therapy: Individual Therapy  Treatment Goals addressed:  Problem: OP Depression     Dates: Start:  09/24/23       Disciplines: Interdisciplinary, Counselor, PROVIDER        Goal: LTG: Reduce frequency, intensity, and duration of depression symptoms so that daily functioning is improved     Dates: Start:  09/24/23    Expected End:  03/26/24       Disciplines: Interdisciplinary, PROVIDER             Goal: LTG: Increase coping skills to manage depression and improve ability to perform daily activities     Dates: Start:  09/24/23    Expected End:  03/26/24       Disciplines: Interdisciplinary, PROVIDER             Goal: STG: [name] will identify cognitive patterns and beliefs that support depression     Dates: Start:  09/24/23    Expected End:  03/26/24       Disciplines: Interdisciplinary, PROVIDER             Goal: STG: "I like to be a little more outgoing as I rarely leave the house"     Dates: Start:  09/24/23    Expected End:  03/26/24       Disciplines: Interdisciplinary, PROVIDER             Goal: Patient and family will better understand the grief process     Dates: Start:  09/24/23    Expected End:  03/26/24       Description:      Disciplines: Counselor      ProgressTowards Goals: Initial  Interventions: CBT, Supportive, and Reframing  Summary: Carrie Roberson is a 78 y.o. female who presents with sxs to include but not limited to misplaced guilt, insomnia, difficulty concentrating, uncontrollable worry, anxious feelings, anhedonia, lack of motivation, low mood, tension, and difficulty relaxing. She denies SI and AVH. She denies alcohol use or drug use.   The clinician began the session by reviewing the patient's treatment plan. The patient agreed to the plan and consented for the clinician to sign off on it. The patient readministered  the GAD-7 and PHQ-9 assessments to evaluate her anxiety and depression symptoms.  The patient reported experiencing self-critical thoughts related to past mistakes, stating, "I wasted my life," while reflecting on a 15-year relationship that impacted her self-esteem. She acknowledged her limited social activities in her younger years, which were influenced by body image issues and a fear of rejection. The patient recalled a specific instance of being bullied and became tearful when discussing unresolved grief related to her brother's unexpected and traumatic death when she was 35. She expressed that today, she feels no motivation to socialize, experiencing negative thoughts such as "you do not fit" and "you are not good enough." Additionally, she experiences anxiety surrounding cancer due to a fear of dying with regrets, stating, "I have no control."  The patient also reported having frequent sleepless nights where she experiences panic and must repeat her safety mantra to regulate her emotions.  The clinician briefly introduced the patient to Eye Movement Desensitization and Reprocessing (EMDR) to address her negative cognitions and assessed her readiness to learn coping skills to manage her anxiety symptoms.  In the next session, the patient will focus on learning grounding techniques to help regulate her emotions during  anxious episodes.   Suicidal/Homicidal: Nowithout intent/plan  Therapist Response: Clinician utilized active and supportive reflection to create a safe space for patient to process recent life events.  Clinician assessed for current symptoms, stressors, safety since last session.  Plan: Return again in 2 weeks.  Diagnosis: MDD (major depressive disorder), recurrent, in partial remission (HCC)  GAD (generalized anxiety disorder) [F41.1]   Collaboration of Care: AEB psychiatrist can access notes and cln. Will review psychiatrists' notes. Check in with the patient and will see  LCSW per availability. Patient agreed with treatment recommendations.   Patient/Guardian was advised Release of Information must be obtained prior to any record release in order to collaborate their care with an outside provider. Patient/Guardian was advised if they have not already done so to contact the registration department to sign all necessary forms in order for us  to release information regarding their care.   Consent: Patient/Guardian gives verbal consent for treatment and assignment of benefits for services provided during this visit. Patient/Guardian expressed understanding and agreed to proceed.   Marvin Slot, LCSW 11/19/2023

## 2023-11-29 ENCOUNTER — Encounter: Payer: Self-pay | Admitting: Physician Assistant

## 2023-11-29 ENCOUNTER — Ambulatory Visit (INDEPENDENT_AMBULATORY_CARE_PROVIDER_SITE_OTHER): Admitting: Physician Assistant

## 2023-11-29 VITALS — BP 185/78 | HR 74 | Ht 62.0 in | Wt 193.6 lb

## 2023-11-29 DIAGNOSIS — N3281 Overactive bladder: Secondary | ICD-10-CM

## 2023-11-29 DIAGNOSIS — N390 Urinary tract infection, site not specified: Secondary | ICD-10-CM | POA: Diagnosis not present

## 2023-11-29 LAB — URINALYSIS, COMPLETE
Bilirubin, UA: NEGATIVE
Glucose, UA: NEGATIVE
Ketones, UA: NEGATIVE
Nitrite, UA: NEGATIVE
Protein,UA: NEGATIVE
Specific Gravity, UA: 1.01 (ref 1.005–1.030)
Urobilinogen, Ur: 0.2 mg/dL (ref 0.2–1.0)
pH, UA: 6 (ref 5.0–7.5)

## 2023-11-29 LAB — MICROSCOPIC EXAMINATION
Epithelial Cells (non renal): >10 /HPF — AB (ref 0–10)
WBC, UA: >30 /HPF — AB (ref 0–5)

## 2023-11-29 NOTE — Patient Instructions (Signed)
 Take nitrofurantoin  twice daily for 5 days.

## 2023-11-29 NOTE — Progress Notes (Signed)
 11/29/2023 4:10 PM   Carrie Roberson Aug 31, 1945 [id number]  CC: Chief Complaint  Patient presents with   Recurrent UTI   HPI: Carrie Roberson is a 78 y.o. 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 difficile colitis, OAB wet with mixed incontinence on Myrbetriq  50 mg daily, and recurrent UTI previously on suppressive Macrobid  who presents today for evaluation of possible UTI.   Today she reports 2 days of bladder pressure and dysuria. No fever, chills, nausea, vomiting. She took a dose of her leftover Macrobid  100mg  x1 dose this morning and hasn't noticed a difference yet.   She is no longer on Myrbetriq  due to cost. She continues to experience daytime frequency and nocturia x3-4. She has occasional constipation, no dry mouth or dry eye. She had dry mouth previously on Viibryd , but that was recently stopped.  In-office UA today positive for 2+ blood and 2+ leukocytes; urine microscopy with >30 WBCs/HPF, 11-30 RBCs/HPF, >10 epithelial cells/HPF, and many bacteria.  PMH: Past Medical History:  Diagnosis Date   Anxiety    Arthritis    Diverticulitis    Family history of breast cancer    Family history of leukemia    GERD (gastroesophageal reflux disease)    History of ischemic colitis 09/02/2017   Hx of colonic polyp    Hypertension    H/O-PT TAKEN OFF BY PCP AS OF 2019   IBS (irritable bowel syndrome)    Ovarian cancer (HCC)    Ovarian cyst    Personal history of chemotherapy 2021   6 treatments   Vaginal atrophy     Surgical History: Past Surgical History:  Procedure Laterality Date   ABDOMINAL HYSTERECTOMY     BREAST BIOPSY Left    needle bx/clip-neg   BREAST BIOPSY Right 06/28/2021   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   CERVICAL BIOPSY  W/ LOOP ELECTRODE EXCISION     CHOLECYSTECTOMY     Colonoscopoy  2007, 2008, 2011   COLONOSCOPY WITH PROPOFOL  N/A 05/05/2018    Procedure: COLONOSCOPY WITH PROPOFOL ;  Surgeon: Selena Daily, MD;  Location: [facility];  Service: Gastroenterology;  Laterality: N/A;   FLEXIBLE SIGMOIDOSCOPY N/A 07/20/2021   Procedure: FLEXIBLE SIGMOIDOSCOPY;  Surgeon: Shane Darling, MD;  Location: [facility];  Service: Endoscopy;  Laterality: N/A;   HYSTERECTOMY ABDOMINAL WITH SALPINGO-OOPHORECTOMY N/A 08/19/2019   Procedure: HYSTERECTOMY ABDOMINAL WITH SALPINGO-OOPHORECTOMY WITH PELVIC WASHINGS PERITONEAL BIOPSIES AND PARA AORTIC LYMPH NODE DISSECTION;  Surgeon: Darl Edu, MD;  Location: [facility];  Service: Gynecology;  Laterality: N/A;   LEEP     OMENTECTOMY N/A 08/19/2019   Procedure: OMENTECTOMY;  Surgeon: Darl Edu, MD;  Location: [facility];  Service: Gynecology;  Laterality: N/A;   OOPHORECTOMY     PORTA CATH INSERTION N/A 09/11/2019   Procedure: PORTA CATH INSERTION;  Surgeon: Celso College, MD;  Location: [facility];  Service: Cardiovascular;  Laterality: N/A;   PORTA CATH REMOVAL N/A 08/16/2022   Procedure: PORTA CATH REMOVAL;  Surgeon: Celso College, MD;  Location: [facility];  Service: Cardiovascular;  Laterality: N/A;   TUBAL LIGATION  1999    Home Medications:  Allergies as of 11/29/2023       Reactions   Citalopram  Nausea And Vomiting   Ivp Dye [iodinated Contrast Media] Hives, Swelling   hives   Meloxicam    unknown   Adhesive [tape] Rash   Amlodipine Rash  Ampicillin Rash   Did it involve swelling of the face/tongue/throat, SOB, or low BP? No Did it involve sudden or severe rash/hives, skin peeling, or any reaction on the inside of your mouth or nose? Yes Did you need to seek medical attention at a hospital or doctor's office? Yes When did it last happen?       10 + years If all above answers are "NO", may proceed with cephalosporin use.   Cefoxitin Rash   Chlorhexidine Rash   Chloraprep Scrub Teal   Estradiol Rash   Estropipate Rash   Olmesartan  Medoxomil-hctz Rash   Sertraline Rash      Sulfa Antibiotics Rash        Medication List        Accurate as of Nov 29, 2023  4:10 PM. If you have any questions, ask your nurse or doctor.          STOP taking these medications    mirabegron  ER 50 MG Tb24 tablet Commonly known as: Myrbetriq  Stopped by: Kathreen Pare       TAKE these medications    acetaminophen  500 MG tablet Commonly known as: TYLENOL  Take 1,000 mg by mouth every 6 (six) hours as needed (for pain.).   busPIRone  5 MG tablet Commonly known as: BUSPAR  Take 1 tablet (5 mg total) by mouth 2 (two) times daily.   diclofenac  75 MG EC tablet Commonly known as: VOLTAREN  Take 1 tablet (75 mg total) by mouth 2 (two) times daily.   fexofenadine-pseudoephedrine 180-240 MG 24 hr tablet Commonly known as: ALLEGRA-D 24 Take 1 tablet by mouth daily.   loratadine 10 MG tablet Commonly known as: CLARITIN Take 10 mg by mouth daily. otc   MIRALAX  PO Take by mouth.   omeprazole 10 MG capsule Commonly known as: PRILOSEC Take 10 mg by mouth daily.   pregabalin  75 MG capsule Commonly known as: LYRICA  TAKE 1 CAPSULE BY MOUTH TWICE A DAY   topiramate  25 MG capsule Commonly known as: TOPAMAX  Take 1 capsule (25 mg total) by mouth at bedtime.   triamcinolone  0.025 % cream Commonly known as: KENALOG  Apply topically 2 (two) times daily.   UP4 PROBIOTICS WOMENS PO Take 1 tablet by mouth daily.   Vilazodone  HCl 10 MG Tabs Commonly known as: VIIBRYD  5 mg daily for one week, then 10 mg daily   Xyzal Allergy 24HR 5 MG tablet Generic drug: levocetirizine Take 10 mg by mouth daily.        Allergies:  Allergies  Allergen Reactions   Citalopram  Nausea And Vomiting   Ivp Dye [Iodinated Contrast Media] Hives and Swelling    hives   Meloxicam     unknown   Adhesive [Tape] Rash   Amlodipine Rash   Ampicillin Rash    Did it involve swelling of the face/tongue/throat, SOB, or low BP? No Did it  involve sudden or severe rash/hives, skin peeling, or any reaction on the inside of your mouth or nose? Yes Did you need to seek medical attention at a hospital or doctor's office? Yes When did it last happen?       10 + years If all above answers are "NO", may proceed with cephalosporin use.   Cefoxitin Rash   Chlorhexidine Rash    Chloraprep Scrub Teal   Estradiol Rash   Estropipate Rash   Olmesartan Medoxomil-Hctz Rash   Sertraline Rash        Sulfa Antibiotics Rash    Family History: Family History  Problem Relation Age of Onset   Heart failure Mother    Atrial fibrillation Mother    Hypertension Mother    Diabetes Father    Heart attack Father    Breast cancer Maternal Aunt 70   Breast cancer Maternal Grandmother 1   Breast cancer Cousin        2 mat cousins   Hypertension Sister    Diabetes Sister    Blindness Sister    Hypertension Sister    Arthritis Sister    Leukemia Paternal Aunt    Leukemia Paternal Uncle    Brain cancer Paternal Aunt    Other Brother 13       Drowning accident   Liver cancer Maternal Uncle    Alcohol abuse Maternal Uncle    Prostate cancer Neg Hx    Kidney cancer Neg Hx    BRCA 1/2 Neg Hx     Social History:   reports that she has never smoked. She has never been exposed to tobacco smoke. She has never used smokeless tobacco. She reports that she does not drink alcohol and does not use drugs.  Physical Exam: BP (!) 185/78   Pulse 74   Ht 5\' 2"  (1.575 m)   Wt 193 lb 9.6 oz (87.8 kg)   BMI 35.41 kg/m   Constitutional:  Alert and oriented, no acute distress, nontoxic appearing HEENT: [place], AT Cardiovascular: No clubbing, cyanosis, or edema Respiratory: Normal respiratory effort, no increased work of breathing Skin: No rashes, bruises or suspicious lesions Neurologic: Grossly intact, no focal deficits, moving all 4 extremities Psychiatric: Normal mood and affect  Laboratory Data: Results for orders placed or performed in visit  on 11/29/23  Microscopic Examination   Collection Time: 11/29/23  3:16 PM   Urine  Result Value Ref Range   WBC, UA >30 (A) 0 - 5 /hpf   RBC, Urine 11-30 (A) 0 - 2 /hpf   Epithelial Cells (non renal) >10 (A) 0 - 10 /hpf   Mucus, UA Present (A) Not Estab.   Bacteria, UA Many (A) None seen/Few  Urinalysis, Complete   Collection Time: 11/29/23  3:16 PM  Result Value Ref Range   Specific Gravity, UA 1.010 1.005 - 1.030   pH, UA 6.0 5.0 - 7.5   Color, UA Yellow Yellow   Appearance Ur Clear Clear   Leukocytes,UA 2+ (A) Negative   Protein,UA Negative Negative/Trace   Glucose, UA Negative Negative   Ketones, UA Negative Negative   RBC, UA 2+ (A) Negative   Bilirubin, UA Negative Negative   Urobilinogen, Ur 0.2 0.2 - 1.0 mg/dL   Nitrite, UA Negative Negative   Microscopic Examination See below:    Assessment & Plan:   1. Recurrent UTI (Primary) First UTI in quite some time. UA positive. Has Macrobid  on hand, will continue for 100mg  BID x5 day course. Will send urine for culture and call if we need to switch. - Urinalysis, Complete - CULTURE, URINE COMPREHENSIVE  2. OAB (overactive bladder) We discussed alternatives including trospium versus Gemtesa as well as third line therapies including PTNS, Botox, and InterStim.  She prefers to defer these for now, which is reasonable.  May revisit in the future per her preference, we will continue to monitor.  Return if symptoms worsen or fail to improve.  Kathreen Pare, PA-C  [facility] [place] [address] [place], [place] [address] [phone]

## 2023-12-02 ENCOUNTER — Ambulatory Visit (INDEPENDENT_AMBULATORY_CARE_PROVIDER_SITE_OTHER): Admitting: Licensed Clinical Social Worker

## 2023-12-02 DIAGNOSIS — F411 Generalized anxiety disorder: Secondary | ICD-10-CM | POA: Diagnosis not present

## 2023-12-02 DIAGNOSIS — F3341 Major depressive disorder, recurrent, in partial remission: Secondary | ICD-10-CM | POA: Diagnosis not present

## 2023-12-02 NOTE — Progress Notes (Signed)
 THERAPIST PROGRESS NOTE  Session Time: 1-2:10pm  Participation Level: Active  Behavioral Response: CasualAlertEuthymic  Type of Therapy: Individual Therapy  Treatment Goals addressed:      Problem: OP Depression     Dates: Start:  09/24/23       Disciplines: Interdisciplinary, Counselor, PROVIDER           Goal: LTG: Reduce frequency, intensity, and duration of depression symptoms so that daily functioning is improved     Dates: Start:  09/24/23    Expected End:  03/26/24       Disciplines: Interdisciplinary, PROVIDER                Goal: LTG: Increase coping skills to manage depression and improve ability to perform daily activities     Dates: Start:  09/24/23    Expected End:  03/26/24       Disciplines: Interdisciplinary, PROVIDER                Goal: STG: [name] will identify cognitive patterns and beliefs that support depression     Dates: Start:  09/24/23    Expected End:  03/26/24       Disciplines: Interdisciplinary, PROVIDER                Goal: STG: "I like to be a little more outgoing as I rarely leave the house"     Dates: Start:  09/24/23    Expected End:  03/26/24       Disciplines: Interdisciplinary, PROVIDER                Goal: Patient and family will better understand the grief process     Dates: Start:  09/24/23    Expected End:  03/26/24       Description:      Disciplines: Counselor      ProgressTowards Goals: Progressing  Interventions: Supportive, CBT, Reframing   Summary: Carrie Roberson is a 78 y.o. female who presents with sxs to include but not limited to misplaced guilt, insomnia, difficulty concentrating, uncontrollable worry, anxious feelings, anhedonia, lack of motivation, low mood, tension, and difficulty relaxing. She denies SI and AVH. She denies alcohol use or drug use.   The patient utilized therapeutic space to process her new anxiety medication, expressing hope as she has not experienced any side effects. She identified the root  of her anxiety, which contributes to her restlessness, as survivor's guilt. The patient expressed challenges in coping with her fear of relapsing from her cancer diagnosis and feeling that she is not living her life as she had expected. She recognized that these beliefs were triggered by her brother's death during their childhood.  The patient expressed anger towards herself, feeling that she missed out on having a true family home and that she wasted time in a 15-year long relationship that did not work out. During the session, the clinician and the patient worked to identify her negative beliefs, with the patient stating, "I do not think much of myself." The clinician helped her understand how these negative beliefs are linked to her sense of unfulfillment.  They reflected on her life before her cancer diagnosis in [place] . The patient mentioned her early retirement and past social habits. She reported feeling like a completely new person since moving to [place] , as she tends to isolate herself, does not socialize, and travels less. Throughout the session, she made numerous comments comparing her life to others'.  The clinician assisted the  patient in recognizing that she will never experience fulfillment if she continues to live a life of regret. The patient struggled to make connections between feelings of unworthiness and survivor's guilt. The clinician and the patient will continue to explore this connection in future sessions.  Suicidal/Homicidal: Nowithout intent/plan  Therapist Response: Clinician utilized active and supportive reflection to create a safe space for patient to process recent life events. Clinician assessed for current symptoms, stressors, safety since last session.  Clinician utilized CBT to assist patient and identify negative cognitions that impact her anxious behaviors.  Clinician made attempts to assist patient in reframing these negative cognitions.  Plan:  Return again in 2 weeks.  Diagnosis: MDD (major depressive disorder), recurrent, in partial remission (HCC)  GAD (generalized anxiety disorder) [F41.1]   Collaboration of Care: AEB psychiatrist can access notes and cln. Will review psychiatrists' notes. Check in with the patient and will see LCSW per availability. Patient agreed with treatment recommendations.   Patient/Guardian was advised Release of Information must be obtained prior to any record release in order to collaborate their care with an outside provider. Patient/Guardian was advised if they have not already done so to contact the registration department to sign all necessary forms in order for us  to release information regarding their care.   Consent: Patient/Guardian gives verbal consent for treatment and assignment of benefits for services provided during this visit. Patient/Guardian expressed understanding and agreed to proceed.   Marvin Slot, LCSW 12/02/2023

## 2023-12-03 LAB — CULTURE, URINE COMPREHENSIVE

## 2023-12-04 ENCOUNTER — Other Ambulatory Visit: Payer: Self-pay | Admitting: Psychiatry

## 2023-12-11 ENCOUNTER — Other Ambulatory Visit: Payer: Self-pay | Admitting: Psychiatry

## 2023-12-11 ENCOUNTER — Telehealth: Payer: Self-pay | Admitting: Physician Assistant

## 2023-12-11 NOTE — Telephone Encounter (Signed)
 Error

## 2023-12-13 ENCOUNTER — Encounter: Payer: Self-pay | Admitting: Physician Assistant

## 2023-12-13 ENCOUNTER — Ambulatory Visit (INDEPENDENT_AMBULATORY_CARE_PROVIDER_SITE_OTHER): Admitting: Physician Assistant

## 2023-12-13 VITALS — BP 129/84 | HR 71 | Ht 62.0 in | Wt 195.4 lb

## 2023-12-13 DIAGNOSIS — R3129 Other microscopic hematuria: Secondary | ICD-10-CM | POA: Diagnosis not present

## 2023-12-13 DIAGNOSIS — N390 Urinary tract infection, site not specified: Secondary | ICD-10-CM | POA: Diagnosis not present

## 2023-12-13 DIAGNOSIS — R3989 Other symptoms and signs involving the genitourinary system: Secondary | ICD-10-CM | POA: Diagnosis not present

## 2023-12-13 LAB — URINALYSIS, COMPLETE
Bilirubin, UA: NEGATIVE
Glucose, UA: NEGATIVE
Ketones, UA: NEGATIVE
Leukocytes,UA: NEGATIVE
Nitrite, UA: NEGATIVE
Protein,UA: NEGATIVE
Specific Gravity, UA: 1.015 (ref 1.005–1.030)
Urobilinogen, Ur: 0.2 mg/dL (ref 0.2–1.0)
pH, UA: 6 (ref 5.0–7.5)

## 2023-12-13 LAB — MICROSCOPIC EXAMINATION: Epithelial Cells (non renal): >10 /HPF — AB (ref 0–10)

## 2023-12-13 MED ORDER — GEMTESA 75 MG PO TABS: 75.0000 mg | ORAL_TABLET | Freq: Every day | ORAL | Status: DC

## 2023-12-13 NOTE — Patient Instructions (Signed)

## 2023-12-13 NOTE — Progress Notes (Signed)
 12/13/2023 9:48 AM   Carrie Roberson Jun 04, 1946 [id number]  CC: Chief Complaint  Patient presents with   Recurrent UTI   HPI: Carrie Roberson is a 78 y.o. 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 difficile colitis, OAB wet with mixed incontinence who failed Myrbetriq  due to cost, and recurrent UTI previously on suppressive Macrobid  who presents today for persistent symptoms after treatment for UTI.   I saw her in clinic most recently 14 days ago for evaluation of UTI.  Her UA appeared grossly infected, though was contaminated.  She took Macrobid  100 mg twice daily x 5 days and her urine culture grew pansensitive E. coli.  Today she reports her pain resolved, but she is having persistent episodes of bladder pressure similar to urgency, which are worse overnight when lying supine.  In-office UA today positive for trace intact blood; urine microscopy with 3-10 RBCs/HPF, >10 epithelial cells/hpf, and many bacteria.  PMH: Past Medical History:  Diagnosis Date   Anxiety    Arthritis    Diverticulitis    Family history of breast cancer    Family history of leukemia    GERD (gastroesophageal reflux disease)    History of ischemic colitis 09/02/2017   Hx of colonic polyp    Hypertension    H/O-PT TAKEN OFF BY PCP AS OF 2019   IBS (irritable bowel syndrome)    Ovarian cancer (HCC)    Ovarian cyst    Personal history of chemotherapy 2021   6 treatments   Vaginal atrophy     Surgical History: Past Surgical History:  Procedure Laterality Date   ABDOMINAL HYSTERECTOMY     BREAST BIOPSY Left    needle bx/clip-neg   BREAST BIOPSY Right 06/28/2021   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   CERVICAL BIOPSY  W/ LOOP ELECTRODE EXCISION     CHOLECYSTECTOMY     Colonoscopoy  2007, 2008, 2011   COLONOSCOPY WITH PROPOFOL  N/A 05/05/2018   Procedure: COLONOSCOPY WITH PROPOFOL ;  Surgeon:  Selena Daily, MD;  Location: [facility];  Service: Gastroenterology;  Laterality: N/A;   FLEXIBLE SIGMOIDOSCOPY N/A 07/20/2021   Procedure: FLEXIBLE SIGMOIDOSCOPY;  Surgeon: Shane Darling, MD;  Location: [facility];  Service: Endoscopy;  Laterality: N/A;   HYSTERECTOMY ABDOMINAL WITH SALPINGO-OOPHORECTOMY N/A 08/19/2019   Procedure: HYSTERECTOMY ABDOMINAL WITH SALPINGO-OOPHORECTOMY WITH PELVIC WASHINGS PERITONEAL BIOPSIES AND PARA AORTIC LYMPH NODE DISSECTION;  Surgeon: Darl Edu, MD;  Location: [facility];  Service: Gynecology;  Laterality: N/A;   LEEP     OMENTECTOMY N/A 08/19/2019   Procedure: OMENTECTOMY;  Surgeon: Darl Edu, MD;  Location: [facility];  Service: Gynecology;  Laterality: N/A;   OOPHORECTOMY     PORTA CATH INSERTION N/A 09/11/2019   Procedure: PORTA CATH INSERTION;  Surgeon: Celso College, MD;  Location: [facility];  Service: Cardiovascular;  Laterality: N/A;   PORTA CATH REMOVAL N/A 08/16/2022   Procedure: PORTA CATH REMOVAL;  Surgeon: Celso College, MD;  Location: [facility];  Service: Cardiovascular;  Laterality: N/A;   TUBAL LIGATION  1999    Home Medications:  Allergies as of 12/13/2023       Reactions   Citalopram  Nausea And Vomiting   Ivp Dye [iodinated Contrast Media] Hives, Swelling   hives   Meloxicam    unknown   Adhesive [tape] Rash   Amlodipine Rash   Ampicillin Rash   Did it involve  swelling of the face/tongue/throat, SOB, or low BP? No Did it involve sudden or severe rash/hives, skin peeling, or any reaction on the inside of your mouth or nose? Yes Did you need to seek medical attention at a hospital or doctor's office? Yes When did it last happen?       10 + years If all above answers are "NO", may proceed with cephalosporin use.   Cefoxitin Rash   Chlorhexidine Rash   Chloraprep Scrub Teal   Estradiol Rash   Estropipate Rash   Olmesartan Medoxomil-hctz Rash   Sertraline Rash      Sulfa  Antibiotics Rash        Medication List        Accurate as of Dec 13, 2023  9:48 AM. If you have any questions, ask your nurse or doctor.          acetaminophen  500 MG tablet Commonly known as: TYLENOL  Take 1,000 mg by mouth every 6 (six) hours as needed (for pain.).   busPIRone  5 MG tablet Commonly known as: BUSPAR  Take 1 tablet (5 mg total) by mouth 2 (two) times daily.   diclofenac  75 MG EC tablet Commonly known as: VOLTAREN  Take 1 tablet (75 mg total) by mouth 2 (two) times daily.   fexofenadine-pseudoephedrine 180-240 MG 24 hr tablet Commonly known as: ALLEGRA-D 24 Take 1 tablet by mouth daily.   loratadine 10 MG tablet Commonly known as: CLARITIN Take 10 mg by mouth daily. otc   MIRALAX  PO Take by mouth.   omeprazole 10 MG capsule Commonly known as: PRILOSEC Take 10 mg by mouth daily.   pregabalin  75 MG capsule Commonly known as: LYRICA  TAKE 1 CAPSULE BY MOUTH TWICE A DAY   topiramate  25 MG capsule Commonly known as: TOPAMAX  Take 1 capsule (25 mg total) by mouth at bedtime.   triamcinolone  0.025 % cream Commonly known as: KENALOG  Apply topically 2 (two) times daily.   UP4 PROBIOTICS WOMENS PO Take 1 tablet by mouth daily.   Vilazodone  HCl 10 MG Tabs Commonly known as: VIIBRYD  5 mg daily for one week, then 10 mg daily   Xyzal Allergy 24HR 5 MG tablet Generic drug: levocetirizine Take 10 mg by mouth daily.        Allergies:  Allergies  Allergen Reactions   Citalopram  Nausea And Vomiting   Ivp Dye [Iodinated Contrast Media] Hives and Swelling    hives   Meloxicam     unknown   Adhesive [Tape] Rash   Amlodipine Rash   Ampicillin Rash    Did it involve swelling of the face/tongue/throat, SOB, or low BP? No Did it involve sudden or severe rash/hives, skin peeling, or any reaction on the inside of your mouth or nose? Yes Did you need to seek medical attention at a hospital or doctor's office? Yes When did it last happen?       10 +  years If all above answers are "NO", may proceed with cephalosporin use.   Cefoxitin Rash   Chlorhexidine Rash    Chloraprep Scrub Teal   Estradiol Rash   Estropipate Rash   Olmesartan Medoxomil-Hctz Rash   Sertraline Rash        Sulfa Antibiotics Rash    Family History: Family History  Problem Relation Age of Onset   Heart failure Mother    Atrial fibrillation Mother    Hypertension Mother    Diabetes Father    Heart attack Father    Breast cancer Maternal Aunt 37  Breast cancer Maternal Grandmother 16   Breast cancer Cousin        2 mat cousins   Hypertension Sister    Diabetes Sister    Blindness Sister    Hypertension Sister    Arthritis Sister    Leukemia Paternal Aunt    Leukemia Paternal Uncle    Brain cancer Paternal Aunt    Other Brother 13       Drowning accident   Liver cancer Maternal Uncle    Alcohol abuse Maternal Uncle    Prostate cancer Neg Hx    Kidney cancer Neg Hx    BRCA 1/2 Neg Hx     Social History:   reports that she has never smoked. She has never been exposed to tobacco smoke. She has never used smokeless tobacco. She reports that she does not drink alcohol and does not use drugs.  Physical Exam: BP 129/84   Pulse 71   Ht 5\' 2"  (1.575 m)   Wt 195 lb 6.4 oz (88.6 kg)   BMI 35.74 kg/m   Constitutional:  Alert and oriented, no acute distress, nontoxic appearing HEENT: [place], AT Cardiovascular: No clubbing, cyanosis, or edema Respiratory: Normal respiratory effort, no increased work of breathing Skin: No rashes, bruises or suspicious lesions Neurologic: Grossly intact, no focal deficits, moving all 4 extremities Psychiatric: Normal mood and affect  Laboratory Data: Results for orders placed or performed in visit on 11/29/23  CULTURE, URINE COMPREHENSIVE   Collection Time: 11/29/23  3:16 PM   Specimen: Urine   UR  Result Value Ref Range   Urine Culture, Comprehensive Final report (A)    Organism ID, Bacteria Escherichia coli (A)     ANTIMICROBIAL SUSCEPTIBILITY Comment   Microscopic Examination   Collection Time: 11/29/23  3:16 PM   Urine  Result Value Ref Range   WBC, UA >30 (A) 0 - 5 /hpf   RBC, Urine 11-30 (A) 0 - 2 /hpf   Epithelial Cells (non renal) >10 (A) 0 - 10 /hpf   Mucus, UA Present (A) Not Estab.   Bacteria, UA Many (A) None seen/Few  Urinalysis, Complete   Collection Time: 11/29/23  3:16 PM  Result Value Ref Range   Specific Gravity, UA 1.010 1.005 - 1.030   pH, UA 6.0 5.0 - 7.5   Color, UA Yellow Yellow   Appearance Ur Clear Clear   Leukocytes,UA 2+ (A) Negative   Protein,UA Negative Negative/Trace   Glucose, UA Negative Negative   Ketones, UA Negative Negative   RBC, UA 2+ (A) Negative   Bilirubin, UA Negative Negative   Urobilinogen, Ur 0.2 0.2 - 1.0 mg/dL   Nitrite, UA Negative Negative   Microscopic Examination See below:    Assessment & Plan:   1. Sensation of pressure in bladder area (Primary) UA today significantly improved over prior with no persistent pyuria.  It continues to appear contaminated, suspect bacteriuria is secondary to this.  Low suspicion for persistent UTI at this time.  We discussed that her symptoms could reflect acute worsening of her underlying OAB in the setting of recent UTI.  I gave her Gemtesa samples for symptom control, see below. - Urinalysis, Complete - Vibegron (GEMTESA) 75 MG TABS; Take 1 tablet (75 mg total) by mouth daily.  2. Microscopic hematuria Improved, though persistent microscopic hematuria on UA today.  With her cancer history, I am keeping a low threshold for further evaluation and would like to obtain a renal ultrasound and cystoscopy.  Again, we  discussed that her symptoms are more likely related to her recent infection, though would like to rule out other urologic pathology. - US  RENAL; Future   Return in about 4 weeks (around 01/10/2024) for Cysto and RUS results with Dr. Estanislao Heimlich.  Kathreen Pare, PA-C  [facility]  [place] [address] [place], [place] [address] [phone]

## 2023-12-15 ENCOUNTER — Emergency Department
Admission: EM | Admit: 2023-12-15 | Discharge: 2023-12-15 | Disposition: A | Discharge status: Home / Self Care | Attending: Emergency Medicine | Admitting: Emergency Medicine

## 2023-12-15 ENCOUNTER — Encounter: Payer: Self-pay | Admitting: Emergency Medicine

## 2023-12-15 ENCOUNTER — Other Ambulatory Visit: Payer: Self-pay

## 2023-12-15 ENCOUNTER — Emergency Department

## 2023-12-15 DIAGNOSIS — Z8543 Personal history of malignant neoplasm of ovary: Secondary | ICD-10-CM | POA: Diagnosis not present

## 2023-12-15 DIAGNOSIS — R1030 Lower abdominal pain, unspecified: Secondary | ICD-10-CM | POA: Diagnosis not present

## 2023-12-15 DIAGNOSIS — Z79899 Other long term (current) drug therapy: Secondary | ICD-10-CM | POA: Diagnosis not present

## 2023-12-15 DIAGNOSIS — K449 Diaphragmatic hernia without obstruction or gangrene: Secondary | ICD-10-CM | POA: Diagnosis not present

## 2023-12-15 DIAGNOSIS — I10 Essential (primary) hypertension: Secondary | ICD-10-CM | POA: Insufficient documentation

## 2023-12-15 DIAGNOSIS — K5730 Diverticulosis of large intestine without perforation or abscess without bleeding: Secondary | ICD-10-CM | POA: Diagnosis not present

## 2023-12-15 LAB — URINALYSIS, W/ REFLEX TO CULTURE (INFECTION SUSPECTED)
Bilirubin Urine: NEGATIVE
Glucose, UA: NEGATIVE mg/dL
Hgb urine dipstick: NEGATIVE
Ketones, ur: 5 mg/dL — AB
Leukocytes,Ua: NEGATIVE
Nitrite: NEGATIVE
Protein, ur: NEGATIVE mg/dL
Specific Gravity, Urine: 1.019 (ref 1.005–1.030)
pH: 5 (ref 5.0–8.0)

## 2023-12-15 LAB — CBC WITH DIFFERENTIAL/PLATELET
Abs Immature Granulocytes: 0.01 10*3/uL (ref 0.00–0.07)
Basophils Absolute: 0 10*3/uL (ref 0.0–0.1)
Basophils Relative: 0 %
Eosinophils Absolute: 0.1 10*3/uL (ref 0.0–0.5)
Eosinophils Relative: 2 %
HCT: 44.6 % (ref 36.0–46.0)
Hemoglobin: 15.1 g/dL — ABNORMAL HIGH (ref 12.0–15.0)
Immature Granulocytes: 0 %
Lymphocytes Relative: 33 %
Lymphs Abs: 1.5 10*3/uL (ref 0.7–4.0)
MCH: 30.1 pg (ref 26.0–34.0)
MCHC: 33.9 g/dL (ref 30.0–36.0)
MCV: 89 fL (ref 80.0–100.0)
Monocytes Absolute: 0.6 10*3/uL (ref 0.1–1.0)
Monocytes Relative: 13 %
Neutro Abs: 2.4 10*3/uL (ref 1.7–7.7)
Neutrophils Relative %: 52 %
Platelets: 215 10*3/uL (ref 150–400)
RBC: 5.01 MIL/uL (ref 3.87–5.11)
RDW: 12.6 % (ref 11.5–15.5)
WBC: 4.6 10*3/uL (ref 4.0–10.5)
nRBC: 0 % (ref 0.0–0.2)

## 2023-12-15 LAB — COMPREHENSIVE METABOLIC PANEL WITH GFR
ALT: 18 U/L (ref 0–44)
AST: 21 U/L (ref 15–41)
Albumin: 3.7 g/dL (ref 3.5–5.0)
Alkaline Phosphatase: 80 U/L (ref 38–126)
Anion gap: 6 (ref 5–15)
BUN: 12 mg/dL (ref 8–23)
CO2: 24 mmol/L (ref 22–32)
Calcium: 10.3 mg/dL (ref 8.9–10.3)
Chloride: 107 mmol/L (ref 98–111)
Creatinine, Ser: 0.76 mg/dL (ref 0.44–1.00)
GFR, Estimated: >60 mL/min (ref >60)
Glucose, Bld: 138 mg/dL — ABNORMAL HIGH (ref 70–99)
Potassium: 3.7 mmol/L (ref 3.5–5.1)
Sodium: 137 mmol/L (ref 135–145)
Total Bilirubin: 0.7 mg/dL (ref 0.0–1.2)
Total Protein: 6.2 g/dL — ABNORMAL LOW (ref 6.5–8.1)

## 2023-12-15 LAB — LIPASE, BLOOD: Lipase: 28 U/L (ref 11–51)

## 2023-12-15 MED ORDER — HYDROCODONE-ACETAMINOPHEN 5-325 MG PO TABS: 1.0000 | ORAL_TABLET | Freq: Three times a day (TID) | ORAL | 0 refills | Status: DC | PRN

## 2023-12-15 MED ORDER — ONDANSETRON 4 MG PO TBDP: 4.0000 mg | ORAL_TABLET | Freq: Four times a day (QID) | ORAL | 0 refills | Status: DC | PRN

## 2023-12-15 NOTE — ED Provider Notes (Signed)
 [facility] Provider Note    Event Date/Time   First MD Initiated Contact with Patient 12/15/23 [phone]     (approximate)   History   Abdominal Pain   HPI  Carrie Roberson is a 78 y.o. female with history of stage I ovarian cancer status post total hysterectomy, chemotherapy in remission since 2021, recurrent UTIs, diverticulitis who presents the emergency department with lower abdominal pressure ongoing for several days.  Symptoms worse with lying flat.  No fevers, nausea, vomiting, diarrhea, current dysuria or hematuria.  Just finished a 5-day course of Macrobid  for UTI and felt like symptoms have gotten better but have not returned.  States that she is scheduled to have a cystoscopy with urology as an outpatient for further evaluation of her symptoms.  She states she previously was on Macrobid  every day as a preventative medication but stopped this about 6 months ago.   History provided by patient, family.    Past Medical History:  Diagnosis Date   Anxiety    Arthritis    Diverticulitis    Family history of breast cancer    Family history of leukemia    GERD (gastroesophageal reflux disease)    History of ischemic colitis 09/02/2017   Hx of colonic polyp    Hypertension    H/O-PT TAKEN OFF BY PCP AS OF 2019   IBS (irritable bowel syndrome)    Ovarian cancer (HCC)    Ovarian cyst    Personal history of chemotherapy 2021   6 treatments   Vaginal atrophy     Past Surgical History:  Procedure Laterality Date   ABDOMINAL HYSTERECTOMY     BREAST BIOPSY Left    needle bx/clip-neg   BREAST BIOPSY Right 06/28/2021   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   CERVICAL BIOPSY  W/ LOOP ELECTRODE EXCISION     CHOLECYSTECTOMY     Colonoscopoy  2007, 2008, 2011   COLONOSCOPY WITH PROPOFOL  N/A 05/05/2018   Procedure: COLONOSCOPY WITH PROPOFOL ;  Surgeon: Selena Daily, MD;  Location: [facility];  Service: Gastroenterology;  Laterality: N/A;    FLEXIBLE SIGMOIDOSCOPY N/A 07/20/2021   Procedure: FLEXIBLE SIGMOIDOSCOPY;  Surgeon: Shane Darling, MD;  Location: [facility];  Service: Endoscopy;  Laterality: N/A;   HYSTERECTOMY ABDOMINAL WITH SALPINGO-OOPHORECTOMY N/A 08/19/2019   Procedure: HYSTERECTOMY ABDOMINAL WITH SALPINGO-OOPHORECTOMY WITH PELVIC WASHINGS PERITONEAL BIOPSIES AND PARA AORTIC LYMPH NODE DISSECTION;  Surgeon: Darl Edu, MD;  Location: [facility];  Service: Gynecology;  Laterality: N/A;   LEEP     OMENTECTOMY N/A 08/19/2019   Procedure: OMENTECTOMY;  Surgeon: Darl Edu, MD;  Location: [facility];  Service: Gynecology;  Laterality: N/A;   OOPHORECTOMY     PORTA CATH INSERTION N/A 09/11/2019   Procedure: PORTA CATH INSERTION;  Surgeon: Celso College, MD;  Location: [facility];  Service: Cardiovascular;  Laterality: N/A;   PORTA CATH REMOVAL N/A 08/16/2022   Procedure: PORTA CATH REMOVAL;  Surgeon: Celso College, MD;  Location: [facility];  Service: Cardiovascular;  Laterality: N/A;   TUBAL LIGATION  1999    MEDICATIONS:  Prior to Admission medications   Medication Sig Start Date End Date Taking? Authorizing Provider  acetaminophen  (TYLENOL ) 500 MG tablet Take 1,000 mg by mouth every 6 (six) hours as needed (for pain.).    [provider]  busPIRone  (BUSPAR ) 5 MG tablet Take 1 tablet (5 mg total) by mouth 2 (two) times daily. 11/17/23 01/16/24  Todd Fossa,  MD  diclofenac  (VOLTAREN ) 75 MG EC tablet Take 1 tablet (75 mg total) by mouth 2 (two) times daily. 01/01/23   Clarise Crooks, MD  fexofenadine-pseudoephedrine (ALLEGRA-D 24) 180-240 MG 24 hr tablet Take 1 tablet by mouth daily.    [provider]  loratadine (CLARITIN) 10 MG tablet Take 10 mg by mouth daily. otc    [provider]  omeprazole (PRILOSEC) 10 MG capsule Take 10 mg by mouth daily.    [provider]  Polyethylene Glycol 3350  (MIRALAX  PO) Take by mouth.    [provider]   pregabalin  (LYRICA ) 75 MG capsule TAKE 1 CAPSULE BY MOUTH TWICE A DAY 11/11/23   Rao, Archana C, MD  Probiotic Product (UP4 PROBIOTICS WOMENS PO) Take 1 tablet by mouth daily.    [provider]  topiramate  (TOPAMAX ) 25 MG capsule Take 1 capsule (25 mg total) by mouth at bedtime. 09/12/23 12/11/23  Todd Fossa, MD  triamcinolone  (KENALOG ) 0.025 % cream Apply topically 2 (two) times daily. 05/11/22   [provider]  Vibegron (GEMTESA) 75 MG TABS Take 1 tablet (75 mg total) by mouth daily. 12/13/23   Vaillancourt, Samantha, PA-C  Vilazodone  HCl (VIIBRYD ) 10 MG TABS 5 mg daily for one week, then 10 mg daily 09/16/23   Hisada, Reina, MD  XYZAL ALLERGY 24HR 5 MG tablet Take 10 mg by mouth daily. 05/12/22   [provider]    Physical Exam   Triage Vital Signs: ED Triage Vitals  Encounter Vitals Group     BP 12/15/23 0400 (!) 160/80     Systolic BP Percentile --      Diastolic BP Percentile --      Pulse Rate 12/15/23 0400 75     Resp 12/15/23 0400 18     Temp 12/15/23 0400 97.8 F (36.6 C)     Temp Source 12/15/23 0400 Oral     SpO2 12/15/23 0400 97 %     Weight 12/15/23 0357 192 lb (87.1 kg)     Height 12/15/23 0357 5\' 2"  (1.575 m)     Head Circumference --      Peak Flow --      Pain Score 12/15/23 0357 0     Pain Loc --      Pain Education --      Exclude from Growth Chart --     Most recent vital signs: Vitals:   12/15/23 0500 12/15/23 0600  BP: (!) 161/71 127/63  Pulse: 62 63  Resp:    Temp:    SpO2: 95% 98%    CONSTITUTIONAL: Alert, responds appropriately to questions. Well-appearing; well-nourished, elderly, pleasant, no distress HEAD: Normocephalic, atraumatic EYES: Conjunctivae clear, pupils appear equal, sclera nonicteric ENT: normal nose; moist mucous membranes NECK: Supple, normal ROM CARD: RRR; S1 and S2 appreciated RESP: Normal chest excursion without splinting or tachypnea; breath sounds clear and equal bilaterally; no wheezes, no  rhonchi, no rales, no hypoxia or respiratory distress, speaking full sentences ABD/GI: Non-distended; soft, non-tender, no rebound, no guarding, no peritoneal signs BACK: The back appears normal EXT: Normal ROM in all joints; no deformity noted, no edema SKIN: Normal color for age and race; warm; no rash on exposed skin NEURO: Moves all extremities equally, normal speech PSYCH: The patient's mood and manner are appropriate.   ED Results / Procedures / Treatments   LABS: (all labs ordered are listed, but only abnormal results are displayed) Labs Reviewed  COMPREHENSIVE METABOLIC PANEL WITH GFR - Abnormal;  Notable for the following components:      Result Value   Glucose, Bld 138 (*)    Total Protein 6.2 (*)    All other components within normal limits  URINALYSIS, W/ REFLEX TO CULTURE (INFECTION SUSPECTED) - Abnormal; Notable for the following components:   Color, Urine YELLOW (*)    APPearance HAZY (*)    Ketones, ur 5 (*)    Bacteria, UA RARE (*)    All other components within normal limits  CBC WITH DIFFERENTIAL/PLATELET - Abnormal; Notable for the following components:   Hemoglobin 15.1 (*)    All other components within normal limits  LIPASE, BLOOD  CBC WITH DIFFERENTIAL/PLATELET     EKG:   RADIOLOGY: My personal review and interpretation of imaging: CT abdomen pelvis shows no acute abnormality.  I have personally reviewed all radiology reports.   CT ABDOMEN PELVIS WO CONTRAST Result Date: 12/15/2023 CLINICAL DATA:  Abdominal pain. Patient complains of lower abdominal pain and lower back pain when lying down. Recently diagnosed with urinary tract infection status post antibiotic therapy. History of ovarian cancer. EXAM: CT ABDOMEN AND PELVIS WITHOUT CONTRAST TECHNIQUE: Multidetector CT imaging of the abdomen and pelvis was performed following the standard protocol without IV contrast. RADIATION DOSE REDUCTION: This exam was performed according to the departmental  dose-optimization program which includes automated exposure control, adjustment of the mA and/or kV according to patient size and/or use of iterative reconstruction technique. COMPARISON:  07/19/2021 FINDINGS: Lower chest: No acute abnormality. Hepatobiliary: No focal liver abnormality. Status post cholecystectomy. Increase caliber of the common bile duct is unchanged from previous exam measuring 1.1 cm. Pancreas: Unremarkable. No pancreatic ductal dilatation or surrounding inflammatory changes. Spleen: Normal in size without focal abnormality. Adrenals/Urinary Tract: Normal adrenal glands. No kidney stones or signs of hydronephrosis. Simple cyst within the medial cortex of the interpolar right kidney measures 1.6 cm. No follow-up imaging recommended. Urinary bladder is within normal limits. Stomach/Bowel: Small hiatal hernia. The appendix is visualized and appears normal. No pathologic dilatation of the large or small bowel loops. Sigmoid diverticulosis without signs of acute diverticulitis. Vascular/Lymphatic: Aortic atherosclerosis. No aneurysm. No signs of abdominopelvic adenopathy. Reproductive: Status post hysterectomy. No adnexal masses. Other: No ascites. No focal fluid collections. No peritoneal nodularity or mass. Musculoskeletal: No acute or significant osseous findings. Lumbar degenerative disc disease. Incidental Schmorl's node noted within the T8 vertebra, image 77/6. IMPRESSION: 1. No acute findings within the abdomen or pelvis. No signs of residual or recurrent malignancy within the abdomen or pelvis in this patient who as a history of ovarian cancer. 2. Sigmoid diverticulosis without signs of acute diverticulitis. 3. Small hiatal hernia. 4.  Aortic Atherosclerosis (ICD10-I70.0). Electronically Signed   By: Kimberley Penman M.D.   On: 12/15/2023 05:43     PROCEDURES:  Critical Care performed: No     Procedures    IMPRESSION / MDM / ASSESSMENT AND PLAN / ED COURSE  I reviewed the triage  vital signs and the nursing notes.    Patient here with complaints of lower abdominal discomfort.  Does have history of recurrent UTI, ovarian cancer, diverticulitis.     DIFFERENTIAL DIAGNOSIS (includes but not limited to):   UTI, recurrent malignancy, bladder spasm, kidney stone, diverticulitis, colitis, seems less likely bowel obstruction or appendicitis given relatively benign exam   Patient's presentation is most consistent with acute presentation with potential threat to life or bodily function.   PLAN: Will obtain labs, urine, CT of the abdomen pelvis.  She  declines anything for pain at this time.   MEDICATIONS GIVEN IN ED: Medications - No data to display   ED COURSE: Labs show no leukocytosis.  Normal electrolytes, creatinine, LFTs and lipase.  Urine only shows rare bacteria but also many squamous cells.  No indication to start her back on antibiotics today but she queries whether or not she should resume taking Macrobid  every day.  She states she has Macrobid  at home that she can restart.  Discussed with her that I do not think this is unreasonable and that she already has follow-up with her urologist and can discuss this with them further.  CT of her abdomen pelvis reviewed and interpreted by myself and the radiologist and shows no acute abnormality.  There is no sign of any recurrent malignancy, kidney stone.  She has diverticulosis without diverticulitis.  I feel she is safe for discharge.  Recommended Tylenol  as needed for home but will discharge with short course of narcotic pain medication so that she has something stronger if needed.  Recommended close follow-up with her PCP as well if further urologic workup is unremarkable.  Patient verbalized understanding and is comfortable with this plan.   At this time, I do not feel there is any life-threatening condition present. I reviewed all nursing notes, vitals, pertinent previous records.  All lab and urine results, EKGs,  imaging ordered have been independently reviewed and interpreted by myself.  I reviewed all available radiology reports from any imaging ordered this visit.  Based on my assessment, I feel the patient is safe to be discharged home without further emergent workup and can continue workup as an outpatient as needed. Discussed all findings, treatment plan as well as usual and customary return precautions.  They verbalize understanding and are comfortable with this plan.  Outpatient follow-up has been provided as needed.  All questions have been answered.    CONSULTS: Admission considered but patient's workup reassuring and she is comfortable with plan for continued outpatient follow-up.   OUTSIDE RECORDS REVIEWED: Reviewed recent urology notes.       FINAL CLINICAL IMPRESSION(S) / ED DIAGNOSES   Final diagnoses:  Lower abdominal pain     Rx / DC Orders   ED Discharge Orders          Ordered    ondansetron  (ZOFRAN -ODT) 4 MG disintegrating tablet  Every 6 hours PRN        12/15/23 0617    HYDROcodone-acetaminophen  (NORCO/VICODIN) 5-325 MG tablet  Every 8 hours PRN        12/15/23 8119             Note:  This document was prepared using Dragon voice recognition software and may include unintentional dictation errors.   Tien Spooner, Clover Dao, DO 12/15/23 [phone]

## 2023-12-15 NOTE — ED Notes (Signed)
 Pt presented to ED with c/o lower abd pressure, worse when laying down. States pressures has been intermittent x 1 week. Denies n/v, fevers. States currently being treated for UTI ongoing x 2 weeks. Hx UTIs, ovarian ca.

## 2023-12-15 NOTE — Discharge Instructions (Addendum)

## 2023-12-15 NOTE — ED Triage Notes (Signed)
 Patient complaining of lower abdominal and lower back pain when lying down. Diagnosed with UTI recently and finished course of antibiotics. Denies fevers at home.   Patient also concerned because she felt similar symptoms when she was diagnosed with ovarian cancer in 2021.

## 2023-12-16 ENCOUNTER — Other Ambulatory Visit: Payer: Self-pay | Admitting: Oncology

## 2023-12-18 ENCOUNTER — Ambulatory Visit
Admission: RE | Admit: 2023-12-18 | Discharge: 2023-12-18 | Disposition: A | Discharge status: Home / Self Care | Source: Ambulatory Visit | Attending: Physician Assistant | Admitting: Physician Assistant

## 2023-12-18 DIAGNOSIS — R3129 Other microscopic hematuria: Secondary | ICD-10-CM | POA: Diagnosis not present

## 2023-12-18 DIAGNOSIS — N281 Cyst of kidney, acquired: Secondary | ICD-10-CM | POA: Diagnosis not present

## 2023-12-20 ENCOUNTER — Ambulatory Visit: Admitting: Licensed Clinical Social Worker

## 2023-12-20 NOTE — Progress Notes (Signed)
 [facility] MD/PA/NP OP Progress Note  12/24/2023 11:10 AM Carrie Roberson  MRN:  [id number]  Chief Complaint:  Chief Complaint  Patient presents with   Follow-up   HPI:  This is a follow-up appointment for depression, anxiety and binge eating.  She states that she thinks BuSpar  has been helping.  She just started 2 weeks ago.  Although she initially experienced dizziness, it has been improving.  She has been able to work on things in the house, not sitting still. She could not continue Viibryd  as she was feeling very anxious, dizzy and had nausea.  These symptoms have improved since the medication was discontinued.  She had anxiety when she had a UTI.  She was found out to have cancer after she suffered from UTI in the past.  She will have cystoscopy.  She feels that cancer never goes away, but hides.  However,  She feels reassured by the negative test.  She also thinks anybody who had cancer diagnosis would feel the similar way.  She sleeps 6 hours.  She took clonazepam a few times due to restlessness especially at night.  She feels down as she is heavier.  She tends to go straight for food when she feels anxious.  It affects her self-esteem, and she would like to try topiramate .  She denies SI.  She agrees with the plans as outlined below.   Household: sister, two dogs Marital status: single Number of children: 0 Employment: retired in 2002, Designer, multimedia:   She was raised in [place]. She has three siblings. Her brother deceased from drowning accident at age 16. She was 78 year old that time. She states that she thinks about him every day, and has never passed it.  She was taking care of her mother, who passed away at age 9. Although she misses her mother, she thinks it was better for her as her mother did not have good quality of life later in the course.    Wt Readings from Last 3 Encounters:  12/24/23 195 lb 3.2 oz (88.5 kg)  12/15/23 192 lb (87.1 kg)  12/13/23 195 lb 6.4 oz (88.6 kg)      Visit Diagnosis:    ICD-10-CM   1. MDD (major depressive disorder), recurrent, in partial remission (HCC)  F33.41     2. GAD (generalized anxiety disorder) [F41.1]  F41.1     3. Binge eating  R63.2       Past Psychiatric History: Please see initial evaluation for full details. I have reviewed the history. No updates at this time.     Past Medical History:  Past Medical History:  Diagnosis Date   Anxiety    Arthritis    Diverticulitis    Family history of breast cancer    Family history of leukemia    GERD (gastroesophageal reflux disease)    History of ischemic colitis 09/02/2017   Hx of colonic polyp    Hypertension    H/O-PT TAKEN OFF BY PCP AS OF 2019   IBS (irritable bowel syndrome)    Ovarian cancer (HCC)    Ovarian cyst    Personal history of chemotherapy 2021   6 treatments   Vaginal atrophy     Past Surgical History:  Procedure Laterality Date   ABDOMINAL HYSTERECTOMY     BREAST BIOPSY Left    needle bx/clip-neg   BREAST BIOPSY Right 06/28/2021   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   CERVICAL BIOPSY  W/ LOOP ELECTRODE  EXCISION     CHOLECYSTECTOMY     Colonoscopoy  2007, 2008, 2011   COLONOSCOPY WITH PROPOFOL  N/A 05/05/2018   Procedure: COLONOSCOPY WITH PROPOFOL ;  Surgeon: Selena Daily, MD;  Location: [facility] ENDOSCOPY;  Service: Gastroenterology;  Laterality: N/A;   FLEXIBLE SIGMOIDOSCOPY N/A 07/20/2021   Procedure: FLEXIBLE SIGMOIDOSCOPY;  Surgeon: Shane Darling, MD;  Location: [facility] ENDOSCOPY;  Service: Endoscopy;  Laterality: N/A;   HYSTERECTOMY ABDOMINAL WITH SALPINGO-OOPHORECTOMY N/A 08/19/2019   Procedure: HYSTERECTOMY ABDOMINAL WITH SALPINGO-OOPHORECTOMY WITH PELVIC WASHINGS PERITONEAL BIOPSIES AND PARA AORTIC LYMPH NODE DISSECTION;  Surgeon: Darl Edu, MD;  Location: [facility] ORS;  Service: Gynecology;  Laterality: N/A;   LEEP     OMENTECTOMY N/A 08/19/2019   Procedure: OMENTECTOMY;  Surgeon: Darl Edu, MD;   Location: [facility] ORS;  Service: Gynecology;  Laterality: N/A;   OOPHORECTOMY     PORTA CATH INSERTION N/A 09/11/2019   Procedure: PORTA CATH INSERTION;  Surgeon: Celso College, MD;  Location: [facility] INVASIVE CV LAB;  Service: Cardiovascular;  Laterality: N/A;   PORTA CATH REMOVAL N/A 08/16/2022   Procedure: PORTA CATH REMOVAL;  Surgeon: Celso College, MD;  Location: [facility] INVASIVE CV LAB;  Service: Cardiovascular;  Laterality: N/A;   TUBAL LIGATION  1999    Family Psychiatric History: Please see initial evaluation for full details. I have reviewed the history. No updates at this time.     Family History:  Family History  Problem Relation Age of Onset   Heart failure Mother    Atrial fibrillation Mother    Hypertension Mother    Diabetes Father    Heart attack Father    Breast cancer Maternal Aunt 56   Breast cancer Maternal Grandmother 74   Breast cancer Cousin        2 mat cousins   Hypertension Sister    Diabetes Sister    Blindness Sister    Hypertension Sister    Arthritis Sister    Leukemia Paternal Aunt    Leukemia Paternal Uncle    Brain cancer Paternal Aunt    Other Brother 13       Drowning accident   Liver cancer Maternal Uncle    Alcohol abuse Maternal Uncle    Prostate cancer Neg Hx    Kidney cancer Neg Hx    BRCA 1/2 Neg Hx     Social History:  Social History   Socioeconomic History   Marital status: Single    Spouse name: Not on file   Number of children: 0   Years of education: Not on file   Highest education level: Associate degree: occupational, Scientist, product/process development, or vocational program  Occupational History   Occupation: Retired  Tobacco Use   Smoking status: Never    Passive exposure: Never   Smokeless tobacco: Never   Tobacco comments:    2bd hand smoke from father  Vaping Use   Vaping status: Never Used  Substance and Sexual Activity   Alcohol use: Never    Comment: rarely   Drug use: Never   Sexual activity: Not Currently    Birth  control/protection: Post-menopausal  Other Topics Concern   Not on file  Social History Narrative   Not on file   Social Drivers of Health   Financial Resource Strain: Low Risk  (12/31/2022)   Overall Financial Resource Strain (CARDIA)    Difficulty of Paying Living Expenses: Not hard at all  Food Insecurity: No Food Insecurity (12/31/2022)   Hunger Vital Sign  Worried About Programme researcher, broadcasting/film/video in the Last Year: Never true    Ran Out of Food in the Last Year: Never true  Transportation Needs: No Transportation Needs (12/31/2022)   PRAPARE - Administrator, Civil Service (Medical): No    Lack of Transportation (Non-Medical): No  Physical Activity: Unknown (12/31/2022)   Exercise Vital Sign    Days of Exercise per Week: 0 days    Minutes of Exercise per Session: Not on file  Recent Concern: Physical Activity - Inactive (12/31/2022)   Exercise Vital Sign    Days of Exercise per Week: 0 days    Minutes of Exercise per Session: 0 min  Stress: No Stress Concern Present (12/31/2022)   [employer] of Occupational Health - Occupational Stress Questionnaire    Feeling of Stress : Not at all  Social Connections: Unknown (12/31/2022)   Social Connection and Isolation Panel [NHANES]    Frequency of Communication with Friends and Family: Three times a week    Frequency of Social Gatherings with Friends and Family: Once a week    Attends Religious Services: Patient declined    Database administrator or Organizations: Patient declined    Attends Banker Meetings: Not on file    Marital Status: Never married    Allergies:  Allergies  Allergen Reactions   Citalopram  Nausea And Vomiting   Ivp Dye [Iodinated Contrast Media] Hives and Swelling    hives   Meloxicam     unknown   Adhesive [Tape] Rash   Amlodipine Rash   Ampicillin Rash    Did it involve swelling of the face/tongue/throat, SOB, or low BP? No Did it involve sudden or severe rash/hives, skin  peeling, or any reaction on the inside of your mouth or nose? Yes Did you need to seek medical attention at a hospital or doctor's office? Yes When did it last happen?       10 + years If all above answers are "NO", may proceed with cephalosporin use.   Cefoxitin Rash   Chlorhexidine Rash    Chloraprep Scrub Teal   Estradiol Rash   Estropipate Rash   Olmesartan Medoxomil-Hctz Rash   Sertraline Rash        Sulfa Antibiotics Rash    Metabolic Disorder Labs: No results found for: "HGBA1C", "MPG" No results found for: "PROLACTIN" Lab Results  Component Value Date   CHOL 188 09/02/2017   TRIG 131 09/02/2017   HDL 53 09/02/2017   CHOLHDL 3.5 09/02/2017   VLDL 26 09/02/2017   LDLCALC 109 (H) 09/02/2017   Lab Results  Component Value Date   TSH 0.925 03/08/2023   TSH 1.535 07/19/2021    Therapeutic Level Labs: No results found for: "LITHIUM" No results found for: "VALPROATE" No results found for: "CBMZ"  Current Medications: Current Outpatient Medications  Medication Sig Dispense Refill   acetaminophen  (TYLENOL ) 500 MG tablet Take 1,000 mg by mouth every 6 (six) hours as needed (for pain.).     busPIRone  (BUSPAR ) 5 MG tablet Take 1 tablet (5 mg total) by mouth 2 (two) times daily. 60 tablet 1   diclofenac  (VOLTAREN ) 75 MG EC tablet Take 1 tablet (75 mg total) by mouth 2 (two) times daily. 30 tablet 0   fexofenadine-pseudoephedrine (ALLEGRA-D 24) 180-240 MG 24 hr tablet Take 1 tablet by mouth daily.     HYDROcodone -acetaminophen  (NORCO/VICODIN) 5-325 MG tablet Take 1 tablet by mouth every 8 (eight) hours as needed. 12 tablet 0  loratadine (CLARITIN) 10 MG tablet Take 10 mg by mouth daily. otc     omeprazole (PRILOSEC) 10 MG capsule Take 10 mg by mouth daily.     ondansetron  (ZOFRAN -ODT) 4 MG disintegrating tablet Take 1 tablet (4 mg total) by mouth every 6 (six) hours as needed for nausea or vomiting. 20 tablet 0   Polyethylene Glycol 3350  (MIRALAX  PO) Take by mouth.      pregabalin  (LYRICA ) 75 MG capsule TAKE 1 CAPSULE BY MOUTH TWICE A DAY 60 capsule 0   Probiotic Product (UP4 PROBIOTICS WOMENS PO) Take 1 tablet by mouth daily.     triamcinolone  (KENALOG ) 0.025 % cream Apply topically 2 (two) times daily.     Vibegron  (GEMTESA ) 75 MG TABS Take 1 tablet (75 mg total) by mouth daily.     Vilazodone  HCl (VIIBRYD ) 10 MG TABS 5 mg daily for one week, then 10 mg daily 30 tablet 1   XYZAL ALLERGY 24HR 5 MG tablet Take 10 mg by mouth daily.     LORazepam  (ATIVAN ) 0.5 MG tablet Take 1 tablet (0.5 mg total) by mouth daily as needed for anxiety. 30 tablet 1   topiramate  (TOPAMAX ) 25 MG capsule Take 1 capsule (25 mg total) by mouth at bedtime. 90 capsule 0   No current facility-administered medications for this visit.   Facility-Administered Medications Ordered in Other Visits  Medication Dose Route Frequency Provider Last Rate Last Admin   sodium chloride  flush (NS) 0.9 % injection 10 mL  10 mL Intravenous PRN Avonne Boettcher, MD   10 mL at 01/05/20 0750     Musculoskeletal: Strength & Muscle Tone: within normal limits Gait & Station: normal Patient leans: N/A  Psychiatric Specialty Exam: Review of Systems  Blood pressure (!) 148/83, pulse 63, temperature (!) 95.3 F (35.2 C), temperature source Temporal, height 5\' 2"  (1.575 m), weight 195 lb 3.2 oz (88.5 kg).Body mass index is 35.7 kg/m.  General Appearance: Well Groomed  Eye Contact:  Good  Speech:  Clear and Coherent  Volume:  Normal  Mood:  better  Affect:  Appropriate, Congruent, and calmer  Thought Process:  Coherent  Orientation:  Full (Time, Place, and Person)  Thought Content: Logical   Suicidal Thoughts:  No  Homicidal Thoughts:  No  Memory:  Immediate;   Good  Judgement:  Good  Insight:  Good  Psychomotor Activity:  Normal  Concentration:  Concentration: Good and Attention Span: Good  Recall:  Good  Fund of Knowledge: Good  Language: Good  Akathisia:  No  Handed:  Right  AIMS (if  indicated): not done  Assets:  Communication Skills Desire for Improvement  ADL's:  Intact  Cognition: WNL  Sleep:  Fair   Screenings: GAD-7    Advertising copywriter from 11/19/2023 in [facility] Office Visit from 08/05/2023 in [facility] Office Visit from 03/21/2023 in [facility] at [facility] Office Visit from 03/08/2023 in [facility] Medicine at [facility] Office Visit from 11/13/2022 in [facility] Medicine at [facility]  Total GAD-7 Score 2 7 0 0 0      PHQ2-9    Flowsheet Row Counselor from 11/19/2023 in [facility] Office Visit from 08/05/2023 in [facility] Office Visit from 03/21/2023 in [facility] Primary [facility] Medicine at [facility] Office Visit from 03/08/2023  in [facility] Care & Sports Medicine at [facility] Office Visit from 11/13/2022 in [facility] Care & Sports Medicine at [facility]  PHQ-2 Total Score 3 3 0 0 0  PHQ-9 Total Score 7 12 0 0 0      Flowsheet Row ED from 12/15/2023 in [facility] Emergency Department at [facility] Visit from 08/27/2022 in [facility] Office Visit from 06/25/2022 in [facility]  C-SSRS RISK CATEGORY No Risk No Risk No Risk        Assessment and Plan:  Carrie Roberson is a 78 y.o. 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   1. MDD (major depressive disorder), recurrent, in partial remission (HCC) 2. GAD (generalized anxiety disorder) [F41.1] Acute stressors include: weight gain Other  stressors include: conflict with her sister, loss of her mother, her brother age 106 from drowning, sister in [place], history of ovarian cancer    History: (UDS negative 10/2023)     There has been improvement in depressive symptoms and anxiety since starting BuSpar .  She was started on this medication due to adverse reaction of nausea, drowsiness from Viibryd .  Although she initially experienced dizziness from BuSpar , and has been subsiding.  Will continue current dose to target anxiety to see if it exerts more benefit.  Will continue lorazepam  as needed for anxiety.  She agrees to minimize its use to avoid risk of dizziness, fall, confusion and dependence.   3. Binge eating She reports binge eating associated with anxiety.  She would like to try topiramate .  Will start this medication to target binge eating.  Discussed potential risk of drowsiness.    Plan Continue buspar  5 mg twice a day (started two weeks ago) Hold Viibryd   (due to side effect of nausea, dizziness) Start topiramate  25 mg at night for one week, then 50 mg at night- she declined a refill Continue lorazepam  0.5 mg daily as needed for anxiety  Next appointment-  7/15 at 1 PM. IP (Try melatonin 3 mg at night, 2 hours before going to the bed) - on pregabalin  75 mg twice a day for neuropathy   Past trials of medication: she does not recall (some medication caused SI in 40's)- sertraline (rash), citalopram  (nausea), venlafaxine  (dizziness from 75 mg), bupropion  (dizziness)   The patient demonstrates the following risk factors for suicide: Chronic risk factors for suicide include: psychiatric disorder of depression, anxiety . Acute risk factors for suicide include: unemployment and loss (financial, interpersonal, professional). Protective factors for this patient include: positive social support, coping skills, and hope for the future. Considering these factors, the overall suicide risk at this point appears to be low. Patient is appropriate  for outpatient follow up.     Collaboration of Care: Collaboration of Care: Other reviewed notes in Epic  Patient/Guardian was advised Release of Information must be obtained prior to any record release in order to collaborate their care with an outside provider. Patient/Guardian was advised if they have not already done so to contact the registration department to sign all necessary forms in order for us  to release information regarding their care.   Consent: Patient/Guardian gives verbal consent for treatment and assignment of benefits for services provided during this visit. Patient/Guardian expressed understanding and agreed to proceed.  Todd Fossa, MD 12/24/2023, 11:10 AM

## 2023-12-24 ENCOUNTER — Encounter: Payer: Self-pay | Admitting: Psychiatry

## 2023-12-24 ENCOUNTER — Other Ambulatory Visit: Payer: Self-pay

## 2023-12-24 ENCOUNTER — Ambulatory Visit (INDEPENDENT_AMBULATORY_CARE_PROVIDER_SITE_OTHER): Admitting: Psychiatry

## 2023-12-24 VITALS — BP 148/83 | HR 63 | Temp 95.3°F | Ht 62.0 in | Wt 195.2 lb

## 2023-12-24 DIAGNOSIS — F3341 Major depressive disorder, recurrent, in partial remission: Secondary | ICD-10-CM | POA: Diagnosis not present

## 2023-12-24 DIAGNOSIS — F411 Generalized anxiety disorder: Secondary | ICD-10-CM | POA: Diagnosis not present

## 2023-12-24 DIAGNOSIS — R632 Polyphagia: Secondary | ICD-10-CM | POA: Diagnosis not present

## 2023-12-24 MED ORDER — LORAZEPAM 0.5 MG PO TABS: 0.5000 mg | ORAL_TABLET | Freq: Every day | ORAL | 1 refills | Status: AC | PRN

## 2023-12-24 NOTE — Patient Instructions (Signed)
 Continue buspar  5 mg twice a day  Hold Viibryd  Start topiramate  25 mg at night for one week, then 50 mg at night Continue lorazepam  0.5 mg daily as needed for anxiety  Next appointment-  7/15 at 1 PM.

## 2023-12-31 ENCOUNTER — Other Ambulatory Visit: Admitting: Urology

## 2024-01-01 ENCOUNTER — Ambulatory Visit: Admitting: Licensed Clinical Social Worker

## 2024-01-12 ENCOUNTER — Other Ambulatory Visit: Payer: Self-pay | Admitting: Psychiatry

## 2024-01-14 ENCOUNTER — Ambulatory Visit: Admitting: Licensed Clinical Social Worker

## 2024-01-21 ENCOUNTER — Ambulatory Visit (INDEPENDENT_AMBULATORY_CARE_PROVIDER_SITE_OTHER): Admitting: Urology

## 2024-01-21 VITALS — BP 137/88 | HR 53 | Ht 62.0 in | Wt 192.0 lb

## 2024-01-21 DIAGNOSIS — R3129 Other microscopic hematuria: Secondary | ICD-10-CM | POA: Diagnosis not present

## 2024-01-21 DIAGNOSIS — N390 Urinary tract infection, site not specified: Secondary | ICD-10-CM

## 2024-01-21 MED ORDER — NITROFURANTOIN MONOHYD MACRO 100 MG PO CAPS
100.0000 mg | ORAL_CAPSULE | Freq: Once | ORAL | Status: AC
  Administered 2024-01-21: 100 mg via ORAL

## 2024-01-21 NOTE — Progress Notes (Signed)
 Cystoscopy Procedure Note:  Indication: Microscopic hematuria  Nitrofurantoin  given for prophylaxis  After informed consent and discussion of the procedure and its risks, Carrie Roberson was positioned and prepped in the standard fashion. Cystoscopy was performed with a flexible cystoscope. The urethra, bladder neck and entire bladder was visualized in a standard fashion. The ureteral orifices were visualized in their normal location and orientation.  Bladder mucosa grossly normal throughout, no abnormalities on retroflexion.  Imaging: CT and ultrasound benign  Findings: Normal cystoscopy  Assessment and Plan: Keep follow-up as scheduled with Carrie Roberson  Redell Burnet, MD 7/1/Roberson

## 2024-01-29 ENCOUNTER — Other Ambulatory Visit: Payer: Self-pay | Admitting: Oncology

## 2024-01-29 ENCOUNTER — Encounter: Payer: Self-pay | Admitting: Student

## 2024-01-29 ENCOUNTER — Ambulatory Visit (INDEPENDENT_AMBULATORY_CARE_PROVIDER_SITE_OTHER): Admitting: Student

## 2024-01-29 VITALS — BP 138/78 | HR 77 | Ht 62.0 in | Wt 188.5 lb

## 2024-01-29 DIAGNOSIS — Z8543 Personal history of malignant neoplasm of ovary: Secondary | ICD-10-CM | POA: Diagnosis not present

## 2024-01-29 DIAGNOSIS — F419 Anxiety disorder, unspecified: Secondary | ICD-10-CM

## 2024-01-29 DIAGNOSIS — G620 Drug-induced polyneuropathy: Secondary | ICD-10-CM | POA: Diagnosis not present

## 2024-01-29 DIAGNOSIS — L501 Idiopathic urticaria: Secondary | ICD-10-CM | POA: Diagnosis not present

## 2024-01-29 DIAGNOSIS — K219 Gastro-esophageal reflux disease without esophagitis: Secondary | ICD-10-CM

## 2024-01-29 DIAGNOSIS — I700 Atherosclerosis of aorta: Secondary | ICD-10-CM

## 2024-01-29 NOTE — Progress Notes (Unsigned)
 Established Patient Office Visit  Subjective   Patient ID: Carrie Roberson, female    DOB: [birth year]  Age: 78 y.o. MRN: [id number]  Chief Complaint  Patient presents with   Establish Care    Patient is here to transition from Dr. Joshua to new PCP   Rash    Dealing with a rash on her arms and legs for over a year, went to see dermatology, would like to see an allergy specialist    Carrie Roberson is a 78 y.o. person who presents for transition of care. Previously seeing Dr. Joshua for primary care. Reports she would like to allergist regarding urticarial rash as she has not had improvement after seeing dermatology for the past year. Please refer to problem based charting for further details and assessment and plan of current problem and chronic medical conditions.   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) 08/19/2019   History of ovarian cancer    Anxiety 09/02/2017   DDD (degenerative disc disease), 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  HENT:     Mouth/Throat:     Mouth: Mucous membranes are moist.     Pharynx: Oropharynx is clear.  Cardiovascular:     Rate and Rhythm: Normal rate and regular rhythm.  Pulmonary:     Effort: Pulmonary effort is normal.     Breath sounds: No rhonchi or rales.  Abdominal:     General: Abdomen is flat. Bowel sounds are normal. There is no distension.     Palpations: Abdomen is soft.     Tenderness: There is no abdominal tenderness.  Musculoskeletal:        General: Normal range of motion.     Right lower leg: No edema.     Left lower leg: No edema.  Skin:    General: Skin is warm and dry.     Capillary Refill: Capillary refill takes less than 2 seconds.     Comments: Scattered excoriation along arms and legs   Neurological:     General: No focal deficit present.     Mental Status: She is alert and oriented to person, place, and time.  Psychiatric:        Mood and Affect: Mood normal.        Behavior: Behavior normal.        01/29/2024    3:55 PM 11/19/2023  1:02 PM 08/05/2023   12:47 PM  Depression screen PHQ 2/9  Decreased Interest 1    Down, Depressed, Hopeless 0    PHQ - 2 Score 1    Altered sleeping 2    Tired, decreased energy 0    Change in appetite 1    Feeling bad or failure about yourself  0    Trouble concentrating 0    Moving slowly or fidgety/restless 0    Suicidal thoughts 0    PHQ-9 Score 4    Difficult doing work/chores Not difficult at all       Information is confidential and restricted. Go to Review Flowsheets to unlock data.       01/29/2024    3:55 PM 11/19/2023    1:05 PM 08/05/2023   12:48 PM 03/21/2023    1:50 PM  GAD 7 : Generalized Anxiety Score  Nervous, Anxious, on Edge 2   0  Control/stop worrying 0   0  Worry too much - different things 0   0  Trouble relaxing 1   0  Restless 1   0  Easily annoyed or irritable 0   0  Afraid - awful might happen 0   0  Total  GAD 7 Score 4   0  Anxiety Difficulty Not difficult at all   Not difficult at all     Information is confidential and restricted. Go to Review Flowsheets to unlock data.    No results found for any visits on 01/29/24.  Last CBC Lab Results  Component Value Date   WBC 4.6 12/15/2023   HGB 15.1 (H) 12/15/2023   HCT 44.6 12/15/2023   MCV 89.0 12/15/2023   MCH 30.1 12/15/2023   RDW 12.6 12/15/2023   PLT 215 12/15/2023   Last metabolic panel Lab Results  Component Value Date   GLUCOSE 138 (H) 12/15/2023   NA 137 12/15/2023   K 3.7 12/15/2023   CL 107 12/15/2023   CO2 24 12/15/2023   BUN 12 12/15/2023   CREATININE 0.76 12/15/2023   GFRNONAA >60 12/15/2023   CALCIUM 10.3 12/15/2023   PHOS 3.2 07/28/2021   PROT 6.2 (L) 12/15/2023   ALBUMIN 3.7 12/15/2023   BILITOT 0.7 12/15/2023   ALKPHOS 80 12/15/2023   AST 21 12/15/2023   ALT 18 12/15/2023   ANIONGAP 6 12/15/2023   Last lipids Lab Results  Component Value Date   CHOL 188 09/02/2017   HDL 53 09/02/2017   LDLCALC 109 (H) 09/02/2017   TRIG 131 09/02/2017   CHOLHDL 3.5 09/02/2017   Last hemoglobin A1c No results found for: HGBA1C Last thyroid  functions Lab Results  Component Value Date   TSH 0.925 03/08/2023       Assessment & Plan:  Aortic atherosclerosis (HCC) Assessment & Plan: No CP or anginal symptoms.  Lipid Panel     Component Value Date/Time   CHOL 188 09/02/2017 1253   TRIG 131 09/02/2017 1253   HDL 53 09/02/2017 1253   CHOLHDL 3.5 09/02/2017 1253   VLDL 26 09/02/2017 1253   LDLCALC 109 (H) 09/02/2017 1253   Lipid panel had not been checked in a few years. Lipid panel ordered. She is due for labs at upcoming psych and oncology appointment. She will see if she can have lipid panel drawn with these labs as labs are limited due to water restrictions in [place]. Likely would benefit from statin. Will discuss lending lipid panel.     Orders: -  Lipid panel  Chronic idiopathic  urticaria Assessment & Plan: Has been seeing [facility] dermatology for this and currently taking allegra, Claritin, xyzal, and hydroxyzine. Does think this helps some with itching. Feels xyzal helps the most and hydroxyzine is helpful at night. Had one dose of xolair but reports reaction to medication where she developed rash and erythema over entire torso.   Overall feels that treatments with dermatology has not helped with recurrent rashes and itching. No associated bruising or joint pain. Had tried multiple different detergents, lotions, and soaps without improvement.  Continue xyzal 10 mg daily and hydroxyzine at night. I have asked her to stop claritin and allegra as she feels they are not helping much. Will make referral to allergy.   Orders: -     Ambulatory referral to Allergy  Chemotherapy-induced peripheral neuropathy (HCC) Assessment & Plan: Doing well on lyrica  75 mg twice daily.   History of ovarian cancer Assessment & Plan: Has upcoming appointment in August with oncology   Anxiety Assessment & Plan: Seeing Dr. Vickey, previously seeing therapist until therapist moved out [place] the [place]. Feels mood is well controlled on current buspar  5 mg and ativan  0.5 mg as needed   Gastroesophageal reflux disease, unspecified whether esophagitis present Assessment & Plan: Takes omeprazole as needed. Discussed avoiding trigger foods including spicy and acidic foods.       Return in about 14 weeks (around 05/06/2024) for chronic f/u.    Harlene Saddler, MD

## 2024-01-30 ENCOUNTER — Other Ambulatory Visit: Payer: Self-pay | Admitting: Psychiatry

## 2024-01-30 DIAGNOSIS — I700 Atherosclerosis of aorta: Secondary | ICD-10-CM | POA: Insufficient documentation

## 2024-01-30 NOTE — Assessment & Plan Note (Signed)
 Seeing Dr. Vickey, previously seeing therapist until therapist moved out of the country. Feels mood is well controlled on current buspar  5 mg and ativan  0.5 mg as needed

## 2024-01-30 NOTE — Assessment & Plan Note (Signed)
 Doing well on lyrica  75 mg twice daily.

## 2024-01-30 NOTE — Assessment & Plan Note (Signed)
 Takes omeprazole as needed. Discussed avoiding trigger foods including spicy and acidic foods.

## 2024-01-30 NOTE — Assessment & Plan Note (Signed)
 No CP or anginal symptoms.  Lipid Panel     Component Value Date/Time   CHOL 188 09/02/2017 1253   TRIG 131 09/02/2017 1253   HDL 53 09/02/2017 1253   CHOLHDL 3.5 09/02/2017 1253   VLDL 26 09/02/2017 1253   LDLCALC 109 (H) 09/02/2017 1253   Lipid panel had not been checked in a few years. Lipid panel ordered. She is due for labs at upcoming psych and oncology appointment. She will see if she can have lipid panel drawn with these labs as labs are limited due to water restrictions in [place]. Likely would benefit from statin. Will discuss lending lipid panel.

## 2024-01-30 NOTE — Assessment & Plan Note (Signed)
 Has upcoming appointment in August with oncology

## 2024-01-30 NOTE — Assessment & Plan Note (Addendum)
 Has been seeing [facility] dermatology for this and currently taking allegra, Claritin, xyzal, and hydroxyzine. Does think this helps some with itching. Feels xyzal helps the most and hydroxyzine is helpful at night. Had one dose of xolair but reports reaction to medication where she developed rash and erythema over entire torso.   Overall feels that treatments with dermatology has not helped with recurrent rashes and itching. No associated bruising or joint pain. Had tried multiple different detergents, lotions, and soaps without improvement.  Continue xyzal 10 mg daily and hydroxyzine at night. I have asked her to stop claritin and allegra as she feels they are not helping much. Will make referral to allergy.

## 2024-01-31 MED ORDER — GEMTESA 75 MG PO TABS: 75.0000 mg | ORAL_TABLET | Freq: Every day | ORAL | 11 refills | Status: DC

## 2024-01-31 NOTE — Telephone Encounter (Signed)
 She declined a refill during her last visit. Could you check if she would like to renew it now?

## 2024-02-01 NOTE — Progress Notes (Unsigned)
 [facility] MD/PA/NP OP Progress Note  02/04/2024 1:34 PM Carrie Roberson  MRN:  [id number]  Chief Complaint:  Chief Complaint  Patient presents with   Follow-up   HPI:  This is a follow-up appointment for depression, anxiety, binge eating.  She states that she is feeling to relax.  Her sister also told her that she is not paying attention, off somewhere.  She is not irritable at all.  She does not feel depressed or anxiety.  She has been doing housecleaning, although she may not do anything for a few days.  It does not bother, not doing things.  She sleeps well most of the time except she has allergy.  She did take clonazepam a few times due to itchiness, which caused anxiety.  She denies panic attacks.  She continues to do binge eating when she is stressed.  However, she has not started topiramate  due to concern about her energy.  She denies SI, HI, hallucinations.  She denies alcohol use or drug use.  She has not had any medication changes other than BuSpar .  She agrees with the plans as outlined below.   Household: sister, two dogs Marital status: single Number of children: 0 Employment: retired in 2002, Designer, multimedia:   She was raised in [place]. She has three siblings. Her brother deceased from drowning accident at age 74. She was 78 year old that time. She states that she thinks about him every day, and has never passed it.  She was taking care of her mother, who passed away at age 66. Although she misses her mother, she thinks it was better for her as her mother did not have good quality of life later in the course.   Visit Diagnosis:    ICD-10-CM   1. MDD (major depressive disorder), recurrent, in partial remission (HCC)  F33.41     2. GAD (generalized anxiety disorder) [F41.1]  F41.1     3. Binge eating  R63.2       Past Psychiatric History: Please see initial evaluation for full details. I have reviewed the history. No updates at this time.     Past Medical History:  Past Medical  History:  Diagnosis Date   Anxiety    Arthritis    Diverticulitis    Family history of breast cancer    Family history of leukemia    GERD (gastroesophageal reflux disease)    History of ischemic colitis 09/02/2017   Hx of colonic polyp    Hypertension    H/O-PT TAKEN OFF BY PCP AS OF 2019   IBS (irritable bowel syndrome)    Ovarian cancer (HCC)    Ovarian cyst    Personal history of chemotherapy 2021   6 treatments   Vaginal atrophy     Past Surgical History:  Procedure Laterality Date   ABDOMINAL HYSTERECTOMY     BREAST BIOPSY Left    needle bx/clip-neg   BREAST BIOPSY Right 06/28/2021   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   CERVICAL BIOPSY  W/ LOOP ELECTRODE EXCISION     CHOLECYSTECTOMY     Colonoscopoy  2007, 2008, 2011   COLONOSCOPY WITH PROPOFOL  N/A 05/05/2018   Procedure: COLONOSCOPY WITH PROPOFOL ;  Surgeon: Unk Corinn Skiff, MD;  Location: [facility];  Service: Gastroenterology;  Laterality: N/A;   FLEXIBLE SIGMOIDOSCOPY N/A 07/20/2021   Procedure: FLEXIBLE SIGMOIDOSCOPY;  Surgeon: Maryruth Ole DASEN, MD;  Location: [facility];  Service: Endoscopy;  Laterality: N/A;   HYSTERECTOMY ABDOMINAL WITH SALPINGO-OOPHORECTOMY  N/A 08/19/2019   Procedure: HYSTERECTOMY ABDOMINAL WITH SALPINGO-OOPHORECTOMY WITH PELVIC WASHINGS PERITONEAL BIOPSIES AND PARA AORTIC LYMPH NODE DISSECTION;  Surgeon: Lake Read, MD;  Location: [facility];  Service: Gynecology;  Laterality: N/A;   LEEP     OMENTECTOMY N/A 08/19/2019   Procedure: OMENTECTOMY;  Surgeon: Lake Read, MD;  Location: [facility];  Service: Gynecology;  Laterality: N/A;   OOPHORECTOMY     PORTA CATH INSERTION N/A 09/11/2019   Procedure: PORTA CATH INSERTION;  Surgeon: Marea Selinda RAMAN, MD;  Location: [facility];  Service: Cardiovascular;  Laterality: N/A;   PORTA CATH REMOVAL N/A 08/16/2022   Procedure: PORTA CATH REMOVAL;  Surgeon: Marea Selinda RAMAN, MD;  Location: [facility];   Service: Cardiovascular;  Laterality: N/A;   TUBAL LIGATION  1999    Family Psychiatric History: Please see initial evaluation for full details. I have reviewed the history. No updates at this time.     Family History:  Family History  Problem Relation Age of Onset   Heart failure Mother    Atrial fibrillation Mother    Hypertension Mother    Diabetes Father    Heart attack Father    Breast cancer Maternal Aunt 30   Breast cancer Maternal Grandmother 34   Breast cancer Cousin        2 mat cousins   Hypertension Sister    Diabetes Sister    Blindness Sister    Hypertension Sister    Arthritis Sister    Leukemia Paternal Aunt    Leukemia Paternal Uncle    Brain cancer Paternal Aunt    Other Brother 13       Drowning accident   Liver cancer Maternal Uncle    Alcohol abuse Maternal Uncle    Prostate cancer Neg Hx    Kidney cancer Neg Hx    BRCA 1/2 Neg Hx     Social History:  Social History   Socioeconomic History   Marital status: Single    Spouse name: Not on file   Number of children: 0   Years of education: Not on file   Highest education level: Associate degree: academic program  Occupational History   Occupation: Retired  Tobacco Use   Smoking status: Never    Passive exposure: Never   Smokeless tobacco: Never   Tobacco comments:    2bd hand smoke from father  Vaping Use   Vaping status: Never Used  Substance and Sexual Activity   Alcohol use: Never    Comment: rarely   Drug use: Never   Sexual activity: Not Currently    Birth control/protection: Post-menopausal  Other Topics Concern   Not on file  Social History Narrative   Not on file   Social Drivers of Health   Financial Resource Strain: Low Risk  (01/28/2024)   Overall Financial Resource Strain (CARDIA)    Difficulty of Paying Living Expenses: Not hard at all  Food Insecurity: No Food Insecurity (01/28/2024)   Hunger Vital Sign    Worried About Running Out of Food in the Last Year: Never  true    Ran Out of Food in the Last Year: Never true  Transportation Needs: No Transportation Needs (01/28/2024)   PRAPARE - Administrator, Civil Service (Medical): No    Lack of Transportation (Non-Medical): No  Physical Activity: Inactive (01/28/2024)   Exercise Vital Sign    Days of Exercise per Week: 0 days    Minutes of Exercise per  Session: Not on file  Stress: No Stress Concern Present (01/28/2024)   [employer] of Occupational Health - Occupational Stress Questionnaire    Feeling of Stress: Only a little  Social Connections: Socially Isolated (01/28/2024)   Social Connection and Isolation Panel    Frequency of Communication with Friends and Family: Three times a week    Frequency of Social Gatherings with Friends and Family: Once a week    Attends Religious Services: Patient declined    Database administrator or Organizations: No    Attends Engineer, structural: Not on file    Marital Status: Never married    Allergies:  Allergies  Allergen Reactions   Citalopram  Nausea And Vomiting   Ivp Dye [Iodinated Contrast Media] Hives and Swelling    hives   Meloxicam     unknown   Adhesive [Tape] Rash   Amlodipine Rash   Ampicillin Rash    Did it involve swelling of the face/tongue/throat, SOB, or low BP? No Did it involve sudden or severe rash/hives, skin peeling, or any reaction on the inside of your mouth or nose? Yes Did you need to seek medical attention at a hospital or doctor's office? Yes When did it last happen?       10 + years If all above answers are "NO", may proceed with cephalosporin use.   Cefoxitin Rash   Chlorhexidine Rash    Chloraprep Scrub Teal   Estradiol Rash   Estropipate Rash   Olmesartan Medoxomil-Hctz Rash   Sertraline Rash        Sulfa Antibiotics Rash    Metabolic Disorder Labs: No results found for: HGBA1C, MPG No results found for: PROLACTIN Lab Results  Component Value Date   CHOL 188 09/02/2017   TRIG  131 09/02/2017   HDL 53 09/02/2017   CHOLHDL 3.5 09/02/2017   VLDL 26 09/02/2017   LDLCALC 109 (H) 09/02/2017   Lab Results  Component Value Date   TSH 0.925 03/08/2023   TSH 1.535 07/19/2021    Therapeutic Level Labs: No results found for: LITHIUM No results found for: VALPROATE No results found for: CBMZ  Current Medications: Current Outpatient Medications  Medication Sig Dispense Refill   acetaminophen  (TYLENOL ) 500 MG tablet Take 1,000 mg by mouth every 6 (six) hours as needed (for pain.).     busPIRone  (BUSPAR ) 5 MG tablet Take 5 mg by mouth as needed (at bedtime).     diclofenac  (VOLTAREN ) 75 MG EC tablet Take 1 tablet (75 mg total) by mouth 2 (two) times daily. 30 tablet 0   fexofenadine-pseudoephedrine (ALLEGRA-D 24) 180-240 MG 24 hr tablet Take 1 tablet by mouth daily.     hydrOXYzine (ATARAX) 25 MG tablet Take 25 mg by mouth at bedtime.     loratadine (CLARITIN) 10 MG tablet Take 10 mg by mouth daily. otc     LORazepam  (ATIVAN ) 0.5 MG tablet Take 1 tablet (0.5 mg total) by mouth daily as needed for anxiety. 30 tablet 1   omeprazole (PRILOSEC) 10 MG capsule Take 10 mg by mouth daily.     Polyethylene Glycol 3350  (MIRALAX  PO) Take by mouth.     pregabalin  (LYRICA ) 75 MG capsule TAKE 1 CAPSULE BY MOUTH TWICE A DAY 60 capsule 0   Probiotic Product (UP4 PROBIOTICS WOMENS PO) Take 1 tablet by mouth daily.     topiramate  (TOPAMAX ) 25 MG capsule Take 1 capsule (25 mg total) by mouth at bedtime. (Patient not taking: Reported on 01/29/2024) 90  capsule 0   triamcinolone  (KENALOG ) 0.025 % cream Apply topically 2 (two) times daily.     Vibegron  (GEMTESA ) 75 MG TABS Take 1 tablet (75 mg total) by mouth daily. 30 tablet 11   XYZAL ALLERGY 24HR 5 MG tablet Take 10 mg by mouth daily.     No current facility-administered medications for this visit.     Musculoskeletal: Strength & Muscle Tone: within normal limits Gait & Station: normal Patient leans: N/A  Psychiatric  Specialty Exam: Review of Systems  Psychiatric/Behavioral: Negative.    All other systems reviewed and are negative.   Blood pressure (!) 145/88, pulse 73, temperature (!) 95.9 F (35.5 C), temperature source Temporal, height 5' 2 (1.575 m), weight 189 lb 9.6 oz (86 kg).Body mass index is 34.68 kg/m.  General Appearance: Well Groomed  Eye Contact:  Good  Speech:  Clear and Coherent  Volume:  Normal  Mood:  not care  Affect:  Appropriate, Congruent, and Full Range  Thought Process:  Coherent  Orientation:  Full (Time, Place, and Person)  Thought Content: Logical   Suicidal Thoughts:  No  Homicidal Thoughts:  No  Memory:  Immediate;   Good  Judgement:  Good  Insight:  Good  Psychomotor Activity:  Normal  Concentration:  Concentration: Good and Attention Span: Good  Recall:  Good  Fund of Knowledge: Good  Language: Good  Akathisia:  No  Handed:  Right  AIMS (if indicated): not done  Assets:  Communication Skills Desire for Improvement  ADL's:  Intact  Cognition: WNL  Sleep:  Fair   Screenings: GAD-7    Flowsheet Row Office Visit from 01/29/2024 in [facility] at [facility] Counselor from 11/19/2023 in [facility] Office Visit from 08/05/2023 in [facility] Office Visit from 03/21/2023 in [facility] at [facility] Office Visit from 03/08/2023 in [facility] at [facility]  Total GAD-7 Score 4 2 7  0 0   PHQ2-9    Flowsheet Row Office Visit from 01/29/2024 in [facility] at [facility] Counselor from 11/19/2023 in [facility] Office Visit from 08/05/2023 in [facility] Office Visit from 03/21/2023 in [facility] Primary Care & Sports Medicine at [facility] Office Visit from  03/08/2023 in [facility] at [facility]  PHQ-2 Total Score 1 3 3  0 0  PHQ-9 Total Score 4 7 12  0 0   Flowsheet Row ED from 12/15/2023 in [facility] at [facility] Visit from 08/27/2022 in [facility] Office Visit from 06/25/2022 in [facility]  C-SSRS RISK CATEGORY No Risk No Risk No Risk     Assessment and Plan:  Carrie Roberson is a 78 y.o. 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   1. MDD (major depressive disorder), recurrent, in partial remission (HCC) 2. GAD (generalized anxiety disorder) [F41.1] The patient has a medical history of ovarian carcinoma. Psychologically, she struggles with survivor's guilt related to the death of her brother, who drowned at age 71 when she was 23. 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 SABRA History: (UDS negative 10/2023)     Although she denies any depressive symptoms or anxiety, she reports feeling of not care over the past month or so, which was also commented by her sister.  Given the potential adverse reaction of alexithymia, we will reduce the dose of BuSpar  to see if it mitigates this possible reaction.  Provided psychoeducation regarding the importance of maintenance treatment.  Will continue lorazepam .  For anxiety. She agrees to minimize its use to avoid risk of dizziness, fall, confusion and dependence.   3. Binge eating Unstable.  She continues to eat, when she gets stressed.  Although the plan was to try topiramate , she has not started this medication due to concern of her allergy.  This is also not the best timing to start due to the possible adverse reaction as outlined above.  Will  hold starting this medication at this time, although this will be reassessed.    Plan Decrease buspar  5 mg a day (not caring from 5 mg BID) Hold starting topiramate  Continue lorazepam  0.5 mg daily as needed for anxiety  Next appointment-  8/21 at 3 PM - on pregabalin  75 mg twice a day for neuropathy   Past trials of medication: she does not recall (some medication caused SI in 40's)- sertraline (rash), citalopram  (nausea), venlafaxine  (dizziness from 75 mg), Viibryd  (nausea, dizziness), bupropion  (dizziness)   The patient demonstrates the following risk factors for suicide: Chronic risk factors for suicide include: psychiatric disorder of depression, anxiety . Acute risk factors for suicide include: unemployment and loss (financial, interpersonal, professional). Protective factors for this patient include: positive social support, coping skills, and hope for the future. Considering these factors, the overall suicide risk at this point appears to be low. Patient is appropriate for outpatient follow up.       Collaboration of Care: Collaboration of Care: Other reviewed notes in Epic  Patient/Guardian was advised Release of Information must be obtained prior to any record release in order to collaborate their care with an outside provider. Patient/Guardian was advised if they have not already done so to contact the registration department to sign all necessary forms in order for us  to release information regarding their care.   Consent: Patient/Guardian gives verbal consent for treatment and assignment of benefits for services provided during this visit. Patient/Guardian expressed understanding and agreed to proceed.    Katheren Sleet, MD 02/04/2024, 1:34 PM

## 2024-02-03 NOTE — Telephone Encounter (Signed)
 Spoke to patient she stated that she does not need a refill she also mentioned that she never started taking the medication.

## 2024-02-04 ENCOUNTER — Ambulatory Visit (INDEPENDENT_AMBULATORY_CARE_PROVIDER_SITE_OTHER): Admitting: Psychiatry

## 2024-02-04 ENCOUNTER — Encounter: Payer: Self-pay | Admitting: Psychiatry

## 2024-02-04 ENCOUNTER — Other Ambulatory Visit: Payer: Self-pay

## 2024-02-04 VITALS — BP 145/88 | HR 73 | Temp 95.9°F | Ht 62.0 in | Wt 189.6 lb

## 2024-02-04 DIAGNOSIS — F411 Generalized anxiety disorder: Secondary | ICD-10-CM | POA: Diagnosis not present

## 2024-02-04 DIAGNOSIS — R632 Polyphagia: Secondary | ICD-10-CM

## 2024-02-04 DIAGNOSIS — F3341 Major depressive disorder, recurrent, in partial remission: Secondary | ICD-10-CM

## 2024-02-04 NOTE — Patient Instructions (Signed)
 Decrease buspar  5 mg a day Continue lorazepam  0.5 mg daily as needed for anxiety  Next appointment-  8/21 at 3 PM

## 2024-02-10 ENCOUNTER — Ambulatory Visit: Admitting: Licensed Clinical Social Worker

## 2024-02-16 ENCOUNTER — Other Ambulatory Visit: Payer: Self-pay | Admitting: Psychiatry

## 2024-02-17 NOTE — Telephone Encounter (Signed)
 Could you ask if she needs this refill? She now takes it 5 mg daily

## 2024-02-17 NOTE — Telephone Encounter (Signed)
 Called spoke to patient she stated that she does need a refill she has only one tablet left and she is taking one tablet daily now after her last visit

## 2024-02-26 ENCOUNTER — Inpatient Hospital Stay: Payer: Medicare Other

## 2024-02-26 ENCOUNTER — Inpatient Hospital Stay: Payer: Medicare Other | Attending: Obstetrics and Gynecology | Admitting: Nurse Practitioner

## 2024-02-26 VITALS — BP 145/69 | HR 70 | Temp 97.8°F | Resp 18 | Wt 192.8 lb

## 2024-02-26 DIAGNOSIS — Z90710 Acquired absence of both cervix and uterus: Secondary | ICD-10-CM | POA: Diagnosis not present

## 2024-02-26 DIAGNOSIS — Z9079 Acquired absence of other genital organ(s): Secondary | ICD-10-CM | POA: Diagnosis not present

## 2024-02-26 DIAGNOSIS — Z8543 Personal history of malignant neoplasm of ovary: Secondary | ICD-10-CM | POA: Diagnosis not present

## 2024-02-26 DIAGNOSIS — Z08 Encounter for follow-up examination after completed treatment for malignant neoplasm: Secondary | ICD-10-CM

## 2024-02-26 DIAGNOSIS — Z90722 Acquired absence of ovaries, bilateral: Secondary | ICD-10-CM | POA: Insufficient documentation

## 2024-02-26 DIAGNOSIS — Z9221 Personal history of antineoplastic chemotherapy: Secondary | ICD-10-CM | POA: Diagnosis not present

## 2024-02-26 NOTE — Progress Notes (Signed)
 Gynecologic Oncology Interval Visit  [facility]  [phone] [phone]  Patient Care Team: Lemon Raisin, MD as PCP - General (Internal Medicine) Unk Corinn Skiff, MD as Consulting Physician (Gastroenterology) Maurie Rayfield BIRCH, RN as Oncology Nurse Navigator Mancil Barter, MD as Referring Physician (Obstetrics) Melanee Annah BROCKS, MD as Consulting Physician (Oncology) Lake Read, MD as Consulting Physician (Obstetrics and Gynecology)   Name of the patient: Carrie Roberson  [id number]  September 17, 1945   Date of visit: 02/26/2024  Referring Provider: Dr. 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 G0P0 female seen in consultation from Dr. 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   She continues to see Dr Lake for interval visits and has been NED.   CA 125 has been followed though not significantly elevated at diagnosis:    She continues to feel well and denies complaints.    Her last [facility] mammogram was 06/05/23 and reported as bi-rads category 1: negative. Last colonoscopy 2019, had flex sig 06/2021.     Gynecologic Oncology History:  [facility] concerning a midline pelvic mass.  Initial presentation was prompted by frequent urination and bladder pessure. PVR was checked and noted to be normal but bladder scan showed residual fluid collection.  TVUS was ordered and revealed a 10.2 x 9.2 x 6.3 cm midline cystic pelvic mass. Symptoms initially started in August of 2020. She had previously been followed asymptomatic 1.42 x 1.58cm simple left ovarian cyst on 11/18/2017.     Appearance on 06/26/2019 ultrasound was notable simple cyst, normal  doppler flow and no free fluid. The patient endorses associated symptoms of pelvic pressure.  The patient denies associated symptoms of  early satiety, weight gain, weight loss, night sweats, vaginal bleeding and nausea.  There is not a notable family history of ovarian cancer, uterine cancer, breast cancer, or colon cancer.  06/07/2018 Pelvic US  Findings:  The uterus is anteverted and measures 6.8x3x2.7cm. Echo texture is homogenous without evidence of focal masses. The Endometrium measures 4.19 mm.   Right Ovary measures 4.7 cm3. It is normal in appearance. Left Ovary measures 5.2 cm3. It has a hypoechoic cyst = 1.4x1.56mm Survey of the adnexa demonstrates no adnexal masses. There is no free fluid in the cul de sac. IMPRESSION:  1. Left Ovary measures 5.2 cm3. It has a hypoechoic cyst = 1.4x1.6mm (possibility of hemorrhagic cyst vs others) 2. Cervical canal appears irregular d/t LEEP procedure  06/26/2019 Abdominal US   FINDINGS: No ascites is demonstrated. There is a cystic midline pelvic mass which lies superior to the bladder, measuring 10.2 x 9.2 x 6.3 cm. IMPRESSION: 1. No ascites. 2. Large cystic mass in the pelvis, not demonstrated on previous ultrasounds. This is suspicious for ovarian cystic neoplasm. Further evaluation recommended with dedicated pelvic ultrasound and/or abdominopelvic CT.  07/01/2019 MRI Reproductive: Uterus: Measures 4.3 x 2.0 by 5.9 cm. Scattered nabothian cysts identified. No mass.  Right ovary measures the 10.5 x 6.8 by 9.0 cm (volume = 340 cm^3). There is a large cystic mass arising from the right ovary which has a maximum dimension of 10.8 cm. An eccentric, enhancing mural nodule is identified measuring 2.6 x 1.2 cm, image 50/10. No internal septation. Left ovary measures 2.9  x 2.1 by 1.8 cm. (Volume = 5.7 cm^3). This contains several small cyst which measure up to 1.4 cm.  06/29/2019 labs Cancer Antigen (CA) 125 0.0 - 38.1 U/mL 10.3   Comment: Roche  Diagnostics Electrochemiluminescence Immunoassay (ECLIA)  Values obtained with different assay methods or kits cannot be  used interchangeably.  Results cannot be interpreted as absolute  evidence of the presence or absence of malignant disease.   HE4 0.0 - 96.9 pmol/L 68.8   Comment: [employer] Electrochemiluminescence Immunoassay (ECLIA)  Values obtained with different assay methods or kits cannot be  used interchangeably.  Results cannot be interpreted as absolute  evidence of the presence or absence of malignant disease.       Postmenopausal ROMA See below 1.21    ROMA calculator 12%  Of note prior LEEP 2002 (negative pathology per patient) last pap 07/26/2014 NILM.   On 08/19/19 she under TAH-BSO with with peritoneal biopsies, pelvic/aortic lymph node dissection, pelvic washings, and omentectomy with Dr. Mancil and Dr. Lake at [facility].  Uneventful recovery.    Pathology:  DIAGNOSIS:  A.  FALLOPIAN TUBE AND OVARY, RIGHT; RIGHT SALPINGO-OOPHORECTOMY:  - HIGH-GRADE SEROUS CARCINOMA OF THE OVARY.  - ANGIOLYMPHATIC INVASION IS PRESENT.  - SEE CANCER SUMMARY BELOW.  - FALLOPIAN TUBE WITH BENIGN PARATUBAL CYSTS.   B.  FALLOPIAN TUBE AND OVARY, LEFT; LEFT SALPINGO-OOPHORECTOMY:  - OVARY WITH SEROUS CYSTADENOMA MEASURING 1.5 CM AND CYSTIC FOLLICLES.  - FALLOPIAN TUBE WITH BENIGN PARATUBAL CYSTS.  - NEGATIVE FOR ATYPIA AND MALIGNANCY.   C.  PERITONEAL STONE; EXCISION:  - STONE, 1.7 CM (GROSS ONLY).   D. UTERUS WITH CERVIX; TOTAL HYSTERECTOMY:  - CERVIX WITH ENDOCERVICAL POLYP MEASURING 0.5 CM, NABOTHIAN CYSTS, AND  TUNNEL CLUSTERS.  - INACTIVE ENDOMETRIUM WITH SMALL ENDOMETRIAL POLYP.  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  - NEGATIVE FOR ATYPIA AND MALIGNANCY.   E.  RIGHT GUTTER; BIOPSY:  - BENIGN MESOTHELIAL LINED FIBROADIPOSE TISSUE.  - NEGATIVE FOR MALIGNANCY.   F.  BLADDER FLAP PERITONEUM; BIOPSY:  - BENIGN FIBROADIPOSE TISSUE.   - NEGATIVE FOR MALIGNANCY.   G.  LYMPH NODE, RIGHT COMMON ILIAC; EXCISION:  - THREE LYMPH NODES NEGATIVE FOR MALIGNANCY (0/3).   H.  LYMPH NODE, RIGHT PELVIC; EXCISION:  - ONE LYMPH NODE NEGATIVE FOR MALIGNANCY (0/1).   I.  OMENTUM; OMENTECTOMY:  - BENIGN FIBROADIPOSE TISSUE.  - NEGATIVE FOR MALIGNANCY.   J.  LYMPH NODE, RIGHT AORTIC; EXCISION:  - ONE LYMPH NODE NEGATIVE FOR MALIGNANCY (0/1).  DIAGNOSIS:  A. PELVIC WASHINGS:  - NEGATIVE FOR MALIGNANCY.  - ERYTHROCYTES AND REACTIVE MESOTHELIAL CELLS  Pathologic Staging: FIGO 1C2  Post op course has been uncomplicated. She received her second covid 19 vaccine. She received lovenox  for 28 day course.    09/29/2019 CT C/A/P IMPRESSION: 1. Suspected thyroid  nodule along the inferior isthmus margin of the thyroid  gland along with a separate anterior mediastinal fairly high density mass with punctate calcifications. 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 Thyroid  scintigraphy should be considered to further characterize these lesions; if negative on scintigraphy then further workup to exclude the possibility of lymphoma or thymic tumor would be suggested. 2. Contrast medium in the distal esophagus suggesting reflux or dysmotility. 3. Several tiny pulmonary nodules are present, 3 mm in average diameter or less. No follow-up needed if patient is low-risk (and has no known or suspected primary neoplasm).  Non-contrast chest CT can be considered in 12 months if patient is high-risk.  4. Aortoiliac atherosclerotic vascular disease. 5. Multilevel lumbar degenerative disc disease. 6. Prior laparotomy with some faint stranding along the anterior omentum in the immediate vicinity of the laparotomy site, likely from benign scarring.  6/21  completed  carbo-taxol  chemotherapy with Dr. Melanee  December 2022 She experienced RLQ abdominal pain and  underwent CT A/P WO contrast in  which showed possible inflammatory/infectious colitis, sigmoid diverticulosis. No adnexal masses noted or lymphadenopathy.   July 2023 Colonoscopy was planned but patient cancelled.    CA 125 trend At diagnosis:  10.3 06/29/2019      10.3 09/15/2019      18.1  12/29/2019       7.7 06/21/20 8.8 11/17/20  8.4 11/17/21 8.2 03/22/23 8 08/28/23  7.9   Genetic testing for germline BRCA1/2 mutations negative.   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  The CancerNext gene panel offered by [employer] includes sequencing and rearrangement analysis for the following 34 genes:   APC, ATM, BARD1, BMPR1A, BRCA1, BRCA2, BRIP1, CDH1, CDK4, CDKN2A, CHEK2, DICER1, HOXB13, EPCAM, GREM1, MLH1, MRE11A, MSH2, MSH6, MUTYH, NBN, NF1, PALB2, PMS2, POLD1, POLE, PTEN, RAD50, RAD51C, RAD51D, SMAD4, SMARCA4, STK11, and TP53.  The report date is 10/29/2019.   TumorHRD testing was negative.     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) 08/19/2019   History of ovarian cancer    Anxiety 09/02/2017   DDD (degenerative disc disease), 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   FH: diabetes mellitus 09/02/2017   Obesity, Class I, BMI 30.0-34.9 (see actual BMI) 09/02/2017   Atrophic vaginitis 09/02/2017   Chronic idiopathic urticaria 09/02/2017   GERD (gastroesophageal reflux disease) 09/02/2017   Past Medical History:  Diagnosis Date   Anxiety    Arthritis     Diverticulitis    Family history of breast cancer    Family history of leukemia    GERD (gastroesophageal reflux disease)    History of ischemic colitis 09/02/2017   Hx of colonic polyp    Hypertension    H/O-PT TAKEN OFF BY PCP AS OF 2019   IBS (irritable bowel syndrome)    Ovarian cancer (HCC)    Ovarian cyst    Personal history of chemotherapy 2021   6 treatments   Vaginal atrophy    Past Surgical History:  Procedure Laterality Date   ABDOMINAL HYSTERECTOMY     BREAST BIOPSY Left    needle bx/clip-neg   BREAST BIOPSY Right 06/28/2021   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   CERVICAL BIOPSY  W/ LOOP ELECTRODE EXCISION     CHOLECYSTECTOMY     Colonoscopoy  2007, 2008, 2011   COLONOSCOPY WITH PROPOFOL  N/A 05/05/2018   Procedure: COLONOSCOPY WITH PROPOFOL ;  Surgeon: Unk Corinn Skiff, MD;  Location: [facility] ENDOSCOPY;  Service: Gastroenterology;  Laterality: N/A;   FLEXIBLE SIGMOIDOSCOPY N/A 07/20/2021   Procedure: FLEXIBLE SIGMOIDOSCOPY;  Surgeon: Maryruth Ole DASEN, MD;  Location: [facility] ENDOSCOPY;  Service: Endoscopy;  Laterality: N/A;   HYSTERECTOMY ABDOMINAL WITH SALPINGO-OOPHORECTOMY N/A 08/19/2019   Procedure: HYSTERECTOMY ABDOMINAL WITH SALPINGO-OOPHORECTOMY WITH PELVIC WASHINGS PERITONEAL BIOPSIES AND PARA AORTIC LYMPH NODE DISSECTION;  Surgeon: Lake Read, MD;  Location: [facility] ORS;  Service: Gynecology;  Laterality: N/A;   LEEP     OMENTECTOMY N/A 08/19/2019   Procedure: OMENTECTOMY;  Surgeon: Lake Read, MD;  Location: [facility] ORS;  Service: Gynecology;  Laterality: N/A;   OOPHORECTOMY     PORTA CATH INSERTION N/A 09/11/2019   Procedure: PORTA CATH INSERTION;  Surgeon: Marea Selinda RAMAN, MD;  Location: [facility] INVASIVE CV LAB;  Service: Cardiovascular;  Laterality: N/A;   PORTA CATH REMOVAL N/A 08/16/2022   Procedure: PORTA CATH REMOVAL;  Surgeon: Marea Selinda RAMAN, MD;  Location: [facility] INV[facility] CV LAB;  Service: Cardiovascular;  Laterality: N/A;   TUBAL LIGATION   1999   Past Gynecologic History:  Menarche: 9 Last Menstrual Period: menopausal History of Abnormal pap: No Contraception: TAH-SBO (07/2019); tubal ligation (1999)  OB History:  OB History  Gravida Para Term Preterm AB Living  0 0 0 0 0 0  SAB IAB Ectopic Multiple Live Births  0 0 0 0 0   Family History  Problem Relation Age of Onset   Heart failure Mother    Atrial fibrillation Mother    Hypertension Mother    Diabetes Father    Heart attack Father    Breast cancer Maternal Aunt 29   Breast cancer Maternal Grandmother 82   Breast cancer Cousin        2 mat cousins   Hypertension Sister    Diabetes Sister    Blindness Sister    Hypertension Sister    Arthritis Sister    Leukemia Paternal Aunt    Leukemia Paternal Uncle    Brain cancer Paternal Aunt    Other Brother 13       Drowning accident   Liver cancer Maternal Uncle    Alcohol abuse Maternal Uncle    Prostate cancer Neg Hx    Kidney cancer Neg Hx    BRCA 1/2 Neg Hx    Social History: Retired  Chief Executive Officer History   Socioeconomic History   Marital status: Single    Spouse name: Not on file   Number of children: 0   Years of education: Not on file   Highest education level: Associate degree: academic program  Occupational History   Occupation: Retired  Tobacco Use   Smoking status: Never    Passive exposure: Never   Smokeless tobacco: Never   Tobacco comments:    2bd hand smoke from father  Vaping Use   Vaping status: Never Used  Substance and Sexual Activity   Alcohol use: Never    Comment: rarely   Drug use: Never   Sexual activity: Not Currently    Birth control/protection: Post-menopausal  Other Topics Concern   Not on file  Social History Narrative   Not on file   Social Drivers of Health   Financial Resource Strain: Low Risk  (01/28/2024)   Overall Financial Resource Strain (CARDIA)    Difficulty of Paying Living Expenses: Not hard at all  Food Insecurity: No Food Insecurity (01/28/2024)    Hunger Vital Sign    Worried About Running Out of Food in the Last Year: Never true    Ran Out of Food in the Last Year: Never true  Transportation Needs: No Transportation Needs (01/28/2024)   PRAPARE - Administrator, Civil Service (Medical):  No    Lack of Transportation (Non-Medical): No  Physical Activity: Inactive (01/28/2024)   Exercise Vital Sign    Days of Exercise per Week: 0 days    Minutes of Exercise per Session: Not on file  Stress: No Stress Concern Present (01/28/2024)   [employer] of Occupational Health - Occupational Stress Questionnaire    Feeling of Stress: Only a little  Social Connections: Socially Isolated (01/28/2024)   Social Connection and Isolation Panel    Frequency of Communication with Friends and Family: Three times a week    Frequency of Social Gatherings with Friends and Family: Once a week    Attends Religious Services: Patient declined    Database administrator or Organizations: No    Attends Engineer, structural: Not on file    Marital Status: Never married  Intimate Partner Violence: Not At Risk (04/26/2022)   Humiliation, Afraid, Rape, and Kick questionnaire    Fear of Current or Ex-Partner: No    Emotionally Abused: No    Physically Abused: No    Sexually Abused: No   Allergies  Allergen Reactions   Citalopram  Nausea And Vomiting   Ivp Dye [Iodinated Contrast Media] Hives and Swelling    hives   Meloxicam     unknown   Adhesive [Tape] Rash   Amlodipine Rash   Ampicillin Rash    Did it involve swelling of the face/tongue/throat, SOB, or low BP? No Did it involve sudden or severe rash/hives, skin peeling, or any reaction on the inside of your mouth or nose? Yes Did you need to seek medical attention at a hospital or doctor's office? Yes When did it last happen?       10 + years If all above answers are "NO", may proceed with cephalosporin use.   Cefoxitin Rash   Chlorhexidine Rash    Chloraprep Scrub Teal    Estradiol Rash   Estropipate Rash   Olmesartan Medoxomil-Hctz Rash   Sertraline Rash        Sulfa Antibiotics Rash   Current Outpatient Medications  Medication Sig Dispense Refill   acetaminophen  (TYLENOL ) 500 MG tablet Take 1,000 mg by mouth every 6 (six) hours as needed (for pain.).     busPIRone  (BUSPAR ) 5 MG tablet Take 1 tablet (5 mg total) by mouth daily. 30 tablet 0   diclofenac  (VOLTAREN ) 75 MG EC tablet Take 1 tablet (75 mg total) by mouth 2 (two) times daily. 30 tablet 0   fexofenadine-pseudoephedrine (ALLEGRA-D 24) 180-240 MG 24 hr tablet Take 1 tablet by mouth daily.     hydrOXYzine (ATARAX) 25 MG tablet Take 25 mg by mouth at bedtime.     loratadine (CLARITIN) 10 MG tablet Take 10 mg by mouth daily. otc     omeprazole (PRILOSEC) 10 MG capsule Take 10 mg by mouth daily.     Polyethylene Glycol 3350  (MIRALAX  PO) Take by mouth.     pregabalin  (LYRICA ) 75 MG capsule TAKE 1 CAPSULE BY MOUTH TWICE A DAY 60 capsule 0   Probiotic Product (UP4 PROBIOTICS WOMENS PO) Take 1 tablet by mouth daily.     topiramate  (TOPAMAX ) 25 MG capsule Take 1 capsule (25 mg total) by mouth at bedtime. (Patient not taking: Reported on 01/29/2024) 90 capsule 0   triamcinolone  (KENALOG ) 0.025 % cream Apply topically 2 (two) times daily.     Vibegron  (GEMTESA ) 75 MG TABS Take 1 tablet (75 mg total) by mouth daily. 30 tablet 11   XYZAL ALLERGY  24HR 5 MG tablet Take 10 mg by mouth daily.     No current facility-administered medications for this visit.   Review of Systems General:  no complaints Skin: no complaints Eyes: no complaints HEENT: no complaints Breasts: no complaints Pulmonary: no complaints Cardiac: no complaints Gastrointestinal: no complaints Genitourinary/Sexual: no complaints Ob/Gyn: no complaints Musculoskeletal: no complaints Hematology: no complaints Neurologic/Psych: no complaints   Objective:  Physical Examination:  Vitals:   02/26/24 1006  BP: (!) 145/69  Pulse: 70   Resp: 18  Temp: 97.8 F (36.6 C)  SpO2: 97%  Weight: 192 lb 12.8 oz (87.5 kg)   ECOG Performance Status: 0 - Asymptomatic  GENERAL: Patient is a well appearing female in no acute distress HEENT:  Sclera clear. Anicteric NODES:  Negative axillary, supraclavicular, inguinal lymph node survery LUNGS:  Clear to auscultation bilaterally.   HEART:  Regular rate and rhythm.  ABDOMEN:  Soft, nontender.  No hernias, incisions well healed. No masses or ascites EXTREMITIES:  No peripheral edema. Atraumatic. No cyanosis SKIN:  Clear with no obvious rashes or skin changes.  NEURO:  Nonfocal. Well oriented.  Appropriate affect.  Pelvic: Exam chaperoned by RN EGBUS: no lesions Cervix: Absent Vagina: no lesions, no discharge or bleeding. Normal vaginal mucosa.  Uterus: Absent BME: no palpable masses Rectovaginal: deferred  Labs:  CA125 pending  Radiologic Imaging: No imaging on site today.     Assessment:  Carrie Roberson is a 78 y.o. 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  Finished 6 cycles of carbo/taxol  chemotherapy with Neulasta  6/21. NED.   CA125/HE4 not elevated at diagnosis but has been followed. Pending today.   Episode of rectal bleeding most likely due to hemorrhoids. No recurrent episodes.   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  S/p Port-a-cath-removal   Medical co-morbidities complicating care: HTN, obesity and prior abdominal surgery (BTL and cholecystectomy). There is no height or weight on file to calculate BMI.   Plan:   Problem List Items Addressed This Visit   None  PARP inhibitor maintenance therapy not indicated for stage I disease (even if she had a BRCA mutation).  Does not need somatic tumor testing, as PARP inhibitor maintenance not approved for stage I disease.    Will follow-up her CA125. She will continue to see her pcp for routine health maintenance and cancer screenings. She is now 4 years from completing treatment Plan to see Dr Lake in 6 months and we will se eher back in 1 year at which point we can consider releasing her to annual exams with Dr Lake.   Continue every 6 month surveillance with gyn-onc and Dr. Lake and will be traveling to his new practice in [facility]. We will see her back in 12 months.   Follow CA 125 at each visit. Estimated risk of recurrence 10-20%.    Tinnie Dawn, DNP, AGNP-C, AOCNP [facility] [phone] (clinic)

## 2024-02-27 LAB — CA 125: Cancer Antigen (CA) 125: 8.6 U/mL (ref 0.0–38.1)

## 2024-02-28 ENCOUNTER — Telehealth: Payer: Self-pay | Admitting: Oncology

## 2024-02-28 NOTE — Telephone Encounter (Signed)
 MD not here 03/20/24  Spoke with pt to confirm new appt date/time. Pt declined AVS to be mailed, stated she will see appts in mychart.

## 2024-03-05 NOTE — Progress Notes (Unsigned)
 [facility] MD/PA/NP OP Progress Note  03/12/2024 3:42 PM Carrie Roberson  MRN:  [id number]  Chief Complaint:  Chief Complaint  Patient presents with   Follow-up   HPI:  This is a follow-up appointment for depression and anxiety.  She states that she is not doing well.  She has been feeling depressed.  She is also concerned about recurrence of cancer although she has no reason to think that way.  She is also concerned about weight gain.  She states that her self-esteem is tied to her weight since childhood.  She does not feel full, and continues to eat.  She sleeps up to 6 hours, not feeling refreshed.  She has not done cleaning, and is hoping to work on this.  She denies SI, HI, hallucinations.  She denies any fall or dizziness.  She agrees with the plans as outlined.   Wt Readings from Last 3 Encounters:  03/12/24 192 lb 6.4 oz (87.3 kg)  02/26/24 192 lb 12.8 oz (87.5 kg)  02/04/24 189 lb 9.6 oz (86 kg)   12/05/21 165 lb 12.8 oz (75.2 kg)  11/17/21 165 lb 14.4 oz (75.3 kg)  10/27/21 170 lb (77.1 kg)    Household: sister, two dogs Marital status: single Number of children: 0 Employment: retired in 2002, Designer, multimedia:   She was raised in [place]. She has three siblings. Her brother deceased from drowning accident at age 13. She was 78 year old that time. She states that she thinks about him every day, and has never passed it.  She was taking care of her mother, who passed away at age 26. Although she misses her mother, she thinks it was better for her as her mother did not have good quality of life later in the course.  Visit Diagnosis:    ICD-10-CM   1. MDD (major depressive disorder), recurrent episode, mild (HCC)  F33.0     2. GAD (generalized anxiety disorder) [F41.1]  F41.1     3. Binge eating  R63.2       Past Psychiatric History: Please see initial evaluation for full details. I have reviewed the history. No updates at this time.     Past Medical History:  Past Medical  History:  Diagnosis Date   Anxiety    Arthritis    Diverticulitis    Family history of breast cancer    Family history of leukemia    GERD (gastroesophageal reflux disease)    History of ischemic colitis 09/02/2017   Hx of colonic polyp    Hypertension    H/O-PT TAKEN OFF BY PCP AS OF 2019   IBS (irritable bowel syndrome)    Ovarian cancer (HCC)    Ovarian cyst    Personal history of chemotherapy 2021   6 treatments   Vaginal atrophy     Past Surgical History:  Procedure Laterality Date   ABDOMINAL HYSTERECTOMY     BREAST BIOPSY Left    needle bx/clip-neg   BREAST BIOPSY Right 06/28/2021   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   CERVICAL BIOPSY  W/ LOOP ELECTRODE EXCISION     CHOLECYSTECTOMY     Colonoscopoy  2007, 2008, 2011   COLONOSCOPY WITH PROPOFOL  N/A 05/05/2018   Procedure: COLONOSCOPY WITH PROPOFOL ;  Surgeon: Unk Corinn Skiff, MD;  Location: [facility];  Service: Gastroenterology;  Laterality: N/A;   FLEXIBLE SIGMOIDOSCOPY N/A 07/20/2021   Procedure: FLEXIBLE SIGMOIDOSCOPY;  Surgeon: Maryruth Ole DASEN, MD;  Location: [facility];  Service:  Endoscopy;  Laterality: N/A;   HYSTERECTOMY ABDOMINAL WITH SALPINGO-OOPHORECTOMY N/A 08/19/2019   Procedure: HYSTERECTOMY ABDOMINAL WITH SALPINGO-OOPHORECTOMY WITH PELVIC WASHINGS PERITONEAL BIOPSIES AND PARA AORTIC LYMPH NODE DISSECTION;  Surgeon: Lake Read, MD;  Location: [facility];  Service: Gynecology;  Laterality: N/A;   LEEP     OMENTECTOMY N/A 08/19/2019   Procedure: OMENTECTOMY;  Surgeon: Lake Read, MD;  Location: [facility];  Service: Gynecology;  Laterality: N/A;   OOPHORECTOMY     PORTA CATH INSERTION N/A 09/11/2019   Procedure: PORTA CATH INSERTION;  Surgeon: Marea Selinda RAMAN, MD;  Location: [facility];  Service: Cardiovascular;  Laterality: N/A;   PORTA CATH REMOVAL N/A 08/16/2022   Procedure: PORTA CATH REMOVAL;  Surgeon: Marea Selinda RAMAN, MD;  Location: [facility];   Service: Cardiovascular;  Laterality: N/A;   TUBAL LIGATION  1999    Family Psychiatric History: Please see initial evaluation for full details. I have reviewed the history. No updates at this time.     Family History:  Family History  Problem Relation Age of Onset   Heart failure Mother    Atrial fibrillation Mother    Hypertension Mother    Diabetes Father    Heart attack Father    Breast cancer Maternal Aunt 30   Breast cancer Maternal Grandmother 68   Breast cancer Cousin        2 mat cousins   Hypertension Sister    Diabetes Sister    Blindness Sister    Hypertension Sister    Arthritis Sister    Leukemia Paternal Aunt    Leukemia Paternal Uncle    Brain cancer Paternal Aunt    Other Brother 13       Drowning accident   Liver cancer Maternal Uncle    Alcohol abuse Maternal Uncle    Prostate cancer Neg Hx    Kidney cancer Neg Hx    BRCA 1/2 Neg Hx     Social History:  Social History   Socioeconomic History   Marital status: Single    Spouse name: Not on file   Number of children: 0   Years of education: Not on file   Highest education level: Associate degree: academic program  Occupational History   Occupation: Retired  Tobacco Use   Smoking status: Never    Passive exposure: Never   Smokeless tobacco: Never   Tobacco comments:    2bd hand smoke from father  Vaping Use   Vaping status: Never Used  Substance and Sexual Activity   Alcohol use: Never    Comment: rarely   Drug use: Never   Sexual activity: Not Currently    Birth control/protection: Post-menopausal  Other Topics Concern   Not on file  Social History Narrative   Not on file   Social Drivers of Health   Financial Resource Strain: Low Risk  (01/28/2024)   Overall Financial Resource Strain (CARDIA)    Difficulty of Paying Living Expenses: Not hard at all  Food Insecurity: No Food Insecurity (01/28/2024)   Hunger Vital Sign    Worried About Running Out of Food in the Last Year: Never  true    Ran Out of Food in the Last Year: Never true  Transportation Needs: No Transportation Needs (01/28/2024)   PRAPARE - Administrator, Civil Service (Medical): No    Lack of Transportation (Non-Medical): No  Physical Activity: Inactive (01/28/2024)   Exercise Vital Sign    Days of Exercise per  Week: 0 days    Minutes of Exercise per Session: Not on file  Stress: No Stress Concern Present (01/28/2024)   [employer] of Occupational Health - Occupational Stress Questionnaire    Feeling of Stress: Only a little  Social Connections: Socially Isolated (01/28/2024)   Social Connection and Isolation Panel    Frequency of Communication with Friends and Family: Three times a week    Frequency of Social Gatherings with Friends and Family: Once a week    Attends Religious Services: Patient declined    Database administrator or Organizations: No    Attends Engineer, structural: Not on file    Marital Status: Never married    Allergies:  Allergies  Allergen Reactions   Citalopram  Nausea And Vomiting   Ivp Dye [Iodinated Contrast Media] Hives and Swelling    hives   Meloxicam     unknown   Adhesive [Tape] Rash   Amlodipine Rash   Ampicillin Rash    Did it involve swelling of the face/tongue/throat, SOB, or low BP? No Did it involve sudden or severe rash/hives, skin peeling, or any reaction on the inside of your mouth or nose? Yes Did you need to seek medical attention at a hospital or doctor's office? Yes When did it last happen?       10 + years If all above answers are "NO", may proceed with cephalosporin use.   Cefoxitin Rash   Chlorhexidine Rash    Chloraprep Scrub Teal   Estradiol Rash   Estropipate Rash   Olmesartan Medoxomil-Hctz Rash   Sertraline Rash        Sulfa Antibiotics Rash    Metabolic Disorder Labs: No results found for: HGBA1C, MPG No results found for: PROLACTIN Lab Results  Component Value Date   CHOL 188 09/02/2017   TRIG  131 09/02/2017   HDL 53 09/02/2017   CHOLHDL 3.5 09/02/2017   VLDL 26 09/02/2017   LDLCALC 109 (H) 09/02/2017   Lab Results  Component Value Date   TSH 0.925 03/08/2023   TSH 1.535 07/19/2021    Therapeutic Level Labs: No results found for: LITHIUM No results found for: VALPROATE No results found for: CBMZ  Current Medications: Current Outpatient Medications  Medication Sig Dispense Refill   acetaminophen  (TYLENOL ) 500 MG tablet Take 1,000 mg by mouth every 6 (six) hours as needed (for pain.).     busPIRone  (BUSPAR ) 5 MG tablet Take 1 tablet (5 mg total) by mouth daily. 30 tablet 0   diclofenac  (VOLTAREN ) 75 MG EC tablet Take 1 tablet (75 mg total) by mouth 2 (two) times daily. 30 tablet 0   fexofenadine-pseudoephedrine (ALLEGRA-D 24) 180-240 MG 24 hr tablet Take 1 tablet by mouth daily.     fluvoxaMINE  (LUVOX ) 25 MG tablet Take 1 tablet (25 mg total) by mouth at bedtime. 30 tablet 1   hydrOXYzine (ATARAX) 25 MG tablet Take 25 mg by mouth at bedtime.     loratadine (CLARITIN) 10 MG tablet Take 10 mg by mouth daily. otc     omeprazole (PRILOSEC) 10 MG capsule Take 10 mg by mouth daily.     Polyethylene Glycol 3350  (MIRALAX  PO) Take by mouth.     pregabalin  (LYRICA ) 75 MG capsule TAKE 1 CAPSULE BY MOUTH TWICE A DAY 60 capsule 0   Probiotic Product (UP4 PROBIOTICS WOMENS PO) Take 1 tablet by mouth daily.     topiramate  (TOPAMAX ) 25 MG capsule Take 1 capsule (25 mg total) by mouth at  bedtime. 90 capsule 0   triamcinolone  (KENALOG ) 0.025 % cream Apply topically 2 (two) times daily.     Vibegron  (GEMTESA ) 75 MG TABS Take 1 tablet (75 mg total) by mouth daily. 30 tablet 11   XYZAL ALLERGY 24HR 5 MG tablet Take 10 mg by mouth daily.     No current facility-administered medications for this visit.     Musculoskeletal: Strength & Muscle Tone: within normal limits Gait & Station: normal Patient leans: N/A  Psychiatric Specialty Exam: Review of Systems   Psychiatric/Behavioral:  Positive for decreased concentration, dysphoric mood and sleep disturbance. Negative for agitation, behavioral problems, confusion, hallucinations, self-injury and suicidal ideas. The patient is nervous/anxious. The patient is not hyperactive.   All other systems reviewed and are negative.   Blood pressure 124/82, pulse 79, temperature 98.1 F (36.7 C), temperature source Temporal, height 5' 2 (1.575 m), weight 192 lb 6.4 oz (87.3 kg), SpO2 99%.Body mass index is 35.19 kg/m.  General Appearance: Well Groomed  Eye Contact:  Good  Speech:  Clear and Coherent  Volume:  Normal  Mood:  Anxious  Affect:  Appropriate, Congruent, and tense  Thought Process:  Coherent  Orientation:  Full (Time, Place, and Person)  Thought Content: Logical   Suicidal Thoughts:  No  Homicidal Thoughts:  No  Memory:  Immediate;   Good  Judgement:  Good  Insight:  Good  Psychomotor Activity:  Normal  Concentration:  Concentration: Good and Attention Span: Good  Recall:  Good  Fund of Knowledge: Good  Language: Good  Akathisia:  No  Handed:  Right  AIMS (if indicated): not done  Assets:  Communication Skills Desire for Improvement  ADL's:  Intact  Cognition: WNL  Sleep:  Poor   Screenings: GAD-7    Flowsheet Row Office Visit from 01/29/2024 in [facility] at [facility] Counselor from 11/19/2023 in [facility] Office Visit from 08/05/2023 in [facility] Office Visit from 03/21/2023 in [facility] at [facility] Office Visit from 03/08/2023 in [facility] at [facility]  Total GAD-7 Score 4 2 7  0 0   PHQ2-9    Flowsheet Row Office Visit from 01/29/2024 in [facility] Medicine at [facility] Counselor from 11/19/2023 in [facility]  Office Visit from 08/05/2023 in [facility] Office Visit from 03/21/2023 in [facility] at [facility] Office Visit from 03/08/2023 in [facility] Primary [facility] at [facility]  PHQ-2 Total Score 1 3 3  0 0  PHQ-9 Total Score 4 7 12  0 0   Flowsheet Row ED from 12/15/2023 in [facility] Department at [facility] Visit from 08/27/2022 in [facility] Office Visit from 06/25/2022 in [facility]  C-SSRS RISK CATEGORY No Risk No Risk No Risk     Assessment and Plan:  Carrie Roberson is a 78 y.o. 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   1. MDD (major depressive disorder), recurrent episode, mild (HCC) 2. GAD (generalized anxiety disorder) [F41.1] The patient has a medical history of ovarian carcinoma. Psychologically, she  struggles with survivor's guilt related to the death of her brother, who drowned at age 5 when she was 64. She also previously served as the primary caregiver for her mother, who passed away at age 65. SABRA History: (UDS negative 10/2023)     The exam is notable for ruminative thoughts about possible recurrence of cancer, and she reports worsening in both anxiety and depressive symptoms since lowering the dose of BuSpar  due to concern of alexithymia.  Will start fluvoxamine  to target depression, anxiety.  Discussed potential risk of nausea, constipation.  Will hold BuSpar  at this time.  Will continue lorazepam  as needed for anxiety. She agrees to minimize its use to avoid risk of dizziness, fall, confusion and dependence.   3. Binge eating Significant worsening.  Will hold trying topiramate  at this time while working on the  intervention as outlined above.  She agrees with the plans.    Plan Start fluvoxamine  25 at night  Hold buspar  Continue lorazepam  0.5 mg daily as needed for anxiety  Next appointment-  10/16 at 10 AM, IP - on pregabalin  75 mg twice a day for neuropathy   Past trials of medication: she does not recall (some medication caused SI in 40's)- sertraline (rash), citalopram  (nausea), venlafaxine  (dizziness from 75 mg), Viibryd  (nausea, dizziness), duloxetine , bupropion  (dizziness)   The patient demonstrates the following risk factors for suicide: Chronic risk factors for suicide include: psychiatric disorder of depression, anxiety . Acute risk factors for suicide include: unemployment and loss (financial, interpersonal, professional). Protective factors for this patient include: positive social support, coping skills, and hope for the future. Considering these factors, the overall suicide risk at this point appears to be low. Patient is appropriate for outpatient follow up.       Collaboration of Care: Collaboration of Care: Other reviewed notes in Epic  Patient/Guardian was advised Release of Information must be obtained prior to any record release in order to collaborate their care with an outside provider. Patient/Guardian was advised if they have not already done so to contact the registration department to sign all necessary forms in order for us  to release information regarding their care.   Consent: Patient/Guardian gives verbal consent for treatment and assignment of benefits for services provided during this visit. Patient/Guardian expressed understanding and agreed to proceed.    Katheren Sleet, MD 03/12/2024, 3:42 PM

## 2024-03-12 ENCOUNTER — Encounter: Payer: Self-pay | Admitting: Psychiatry

## 2024-03-12 ENCOUNTER — Ambulatory Visit (INDEPENDENT_AMBULATORY_CARE_PROVIDER_SITE_OTHER): Admitting: Psychiatry

## 2024-03-12 VITALS — BP 124/82 | HR 79 | Temp 98.1°F | Ht 62.0 in | Wt 192.4 lb

## 2024-03-12 DIAGNOSIS — F330 Major depressive disorder, recurrent, mild: Secondary | ICD-10-CM

## 2024-03-12 DIAGNOSIS — F411 Generalized anxiety disorder: Secondary | ICD-10-CM

## 2024-03-12 DIAGNOSIS — R632 Polyphagia: Secondary | ICD-10-CM | POA: Diagnosis not present

## 2024-03-12 MED ORDER — FLUVOXAMINE MALEATE 25 MG PO TABS: 25.0000 mg | ORAL_TABLET | Freq: Every day | ORAL | 1 refills | Status: DC

## 2024-03-12 NOTE — Patient Instructions (Signed)
 Start fluvoxamine  25 at night  Hold buspar  Continue lorazepam  0.5 mg daily as needed for anxiety  Next appointment-  10/16 at 10 AM

## 2024-03-13 ENCOUNTER — Other Ambulatory Visit: Payer: Self-pay | Admitting: Psychiatry

## 2024-03-16 ENCOUNTER — Other Ambulatory Visit: Payer: Self-pay | Admitting: Nurse Practitioner

## 2024-03-17 ENCOUNTER — Telehealth: Payer: Self-pay

## 2024-03-17 ENCOUNTER — Other Ambulatory Visit: Payer: Self-pay | Admitting: Psychiatry

## 2024-03-17 NOTE — Telephone Encounter (Signed)
 pt states that the fluvoxamine  is making her very nauseous. pt was last seen on 8-21 next appt 10-16

## 2024-03-17 NOTE — Telephone Encounter (Signed)
 pt asked if she can go back on the buspirone  10mg 

## 2024-03-17 NOTE — Telephone Encounter (Signed)
 Please advise her to discontinue fluvoxamine  at this time.  Given the medication trials and her ongoing depressive symptoms, I recommend she consider Transcranial Magnetic Stimulation (TMS) as a next step in treatment. (We've talked about this option previously, fyi)

## 2024-03-20 ENCOUNTER — Ambulatory Visit: Payer: Medicare Other | Admitting: Oncology

## 2024-03-20 ENCOUNTER — Other Ambulatory Visit: Payer: Medicare Other

## 2024-04-02 ENCOUNTER — Other Ambulatory Visit: Payer: Self-pay

## 2024-04-02 DIAGNOSIS — E611 Iron deficiency: Secondary | ICD-10-CM

## 2024-04-03 ENCOUNTER — Encounter: Payer: Self-pay | Admitting: Oncology

## 2024-04-03 ENCOUNTER — Inpatient Hospital Stay (HOSPITAL_BASED_OUTPATIENT_CLINIC_OR_DEPARTMENT_OTHER): Payer: Self-pay | Admitting: Oncology

## 2024-04-03 ENCOUNTER — Ambulatory Visit: Payer: Self-pay | Admitting: Oncology

## 2024-04-03 ENCOUNTER — Inpatient Hospital Stay: Payer: Self-pay | Attending: Obstetrics and Gynecology

## 2024-04-03 VITALS — BP 137/64 | HR 64 | Temp 96.4°F | Resp 18 | Ht 62.0 in | Wt 189.7 lb

## 2024-04-03 DIAGNOSIS — D509 Iron deficiency anemia, unspecified: Secondary | ICD-10-CM | POA: Insufficient documentation

## 2024-04-03 DIAGNOSIS — E611 Iron deficiency: Secondary | ICD-10-CM

## 2024-04-03 DIAGNOSIS — D709 Neutropenia, unspecified: Secondary | ICD-10-CM | POA: Diagnosis not present

## 2024-04-03 DIAGNOSIS — D225 Melanocytic nevi of trunk: Secondary | ICD-10-CM | POA: Diagnosis not present

## 2024-04-03 DIAGNOSIS — Z08 Encounter for follow-up examination after completed treatment for malignant neoplasm: Secondary | ICD-10-CM

## 2024-04-03 DIAGNOSIS — Z808 Family history of malignant neoplasm of other organs or systems: Secondary | ICD-10-CM | POA: Diagnosis not present

## 2024-04-03 DIAGNOSIS — G620 Drug-induced polyneuropathy: Secondary | ICD-10-CM

## 2024-04-03 DIAGNOSIS — Z8041 Family history of malignant neoplasm of ovary: Secondary | ICD-10-CM | POA: Insufficient documentation

## 2024-04-03 DIAGNOSIS — Z90710 Acquired absence of both cervix and uterus: Secondary | ICD-10-CM | POA: Diagnosis not present

## 2024-04-03 DIAGNOSIS — T451X5A Adverse effect of antineoplastic and immunosuppressive drugs, initial encounter: Secondary | ICD-10-CM

## 2024-04-03 DIAGNOSIS — Z806 Family history of leukemia: Secondary | ICD-10-CM | POA: Insufficient documentation

## 2024-04-03 DIAGNOSIS — Z8543 Personal history of malignant neoplasm of ovary: Secondary | ICD-10-CM | POA: Insufficient documentation

## 2024-04-03 DIAGNOSIS — Z800 Family history of malignant neoplasm of digestive organs: Secondary | ICD-10-CM | POA: Insufficient documentation

## 2024-04-03 DIAGNOSIS — Z803 Family history of malignant neoplasm of breast: Secondary | ICD-10-CM | POA: Insufficient documentation

## 2024-04-03 LAB — CBC (CANCER CENTER ONLY)
HCT: 43.7 % (ref 36.0–46.0)
Hemoglobin: 14.8 g/dL (ref 12.0–15.0)
MCH: 30.2 pg (ref 26.0–34.0)
MCHC: 33.9 g/dL (ref 30.0–36.0)
MCV: 89.2 fL (ref 80.0–100.0)
Platelet Count: 226 K/uL (ref 150–400)
RBC: 4.9 MIL/uL (ref 3.87–5.11)
RDW: 13.1 % (ref 11.5–15.5)
WBC Count: 3 K/uL — ABNORMAL LOW (ref 4.0–10.5)
nRBC: 0 % (ref 0.0–0.2)

## 2024-04-03 LAB — IRON AND TIBC
Iron: 103 ug/dL (ref 28–170)
Saturation Ratios: 31 % (ref 10.4–31.8)
TIBC: 332 ug/dL (ref 250–450)
UIBC: 229 ug/dL

## 2024-04-03 LAB — FERRITIN: Ferritin: 48 ng/mL (ref 11–307)

## 2024-04-03 NOTE — Progress Notes (Unsigned)
 Hematology/Oncology Consult note [facility]  [phone] [phone]  Patient Care Team: Lemon Raisin, MD as PCP - General (Internal Medicine) Unk Corinn Skiff, MD as Consulting Physician (Gastroenterology) Maurie Rayfield BIRCH, RN as Oncology Nurse Navigator Mancil Barter, MD as Referring Physician (Obstetrics) Melanee Annah BROCKS, MD as Consulting Physician (Oncology) Lake Read, MD as Consulting Physician (Obstetrics and Gynecology)   Name of the patient: Carrie Roberson  [id number]  May 13, 1946   Date of visit: 04/03/24  Diagnosis- ***  Chief complaint/ Reason for visit- ***  Heme/Onc history: ***  Interval history- ***  ECOG PS- *** Pain scale- *** Opioid associated constipation- ***  Review of systems- ROS    Allergies  Allergen Reactions  . Citalopram  Nausea And Vomiting  . Ivp Dye [Iodinated Contrast Media] Hives and Swelling    hives  . Meloxicam     unknown  . Adhesive [Tape] Rash  . Amlodipine Rash  . Ampicillin Rash    Did it involve swelling of the face/tongue/throat, SOB, or low BP? No Did it involve sudden or severe rash/hives, skin peeling, or any reaction on the inside of your mouth or nose? Yes Did you need to seek medical attention at a hospital or doctor's office? Yes When did it last happen?       10 + years If all above answers are "NO", may proceed with cephalosporin use.  . Cefoxitin Rash  . Chlorhexidine Rash    Chloraprep Scrub Teal  . Estradiol Rash  . Estropipate Rash  . Olmesartan Medoxomil-Hctz Rash  . Sertraline Rash       . Sulfa Antibiotics Rash  . Xolair [Omalizumab] Rash     Past Medical History:  Diagnosis Date  . Anxiety   . Arthritis   . Diverticulitis   . Family history of breast cancer   . Family history of leukemia   . GERD (gastroesophageal reflux disease)   . History of ischemic colitis 09/02/2017  . Hx of colonic polyp   . Hypertension    H/O-PT TAKEN OFF  BY PCP AS OF 2019  . IBS (irritable bowel syndrome)   . Ovarian cancer (HCC)   . Ovarian cyst   . Personal history of chemotherapy 2021   6 treatments  . Vaginal atrophy      Past Surgical History:  Procedure Laterality Date  . ABDOMINAL HYSTERECTOMY    . BREAST BIOPSY Left    needle bx/clip-neg  . 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 CERVICAL BIOPSY  W/ LOOP ELECTRODE EXCISION    . CHOLECYSTECTOMY    . Colonoscopoy  2007, 2008, 2011  . COLONOSCOPY WITH PROPOFOL  N/A 05/05/2018   Procedure: COLONOSCOPY WITH PROPOFOL ;  Surgeon: Unk Corinn Skiff, MD;  Location: [facility] ENDOSCOPY;  Service: Gastroenterology;  Laterality: N/A;  . FLEXIBLE SIGMOIDOSCOPY N/A 07/20/2021   Procedure: FLEXIBLE SIGMOIDOSCOPY;  Surgeon: Maryruth Ole DASEN, MD;  Location: [facility] ENDOSCOPY;  Service: Endoscopy;  Laterality: N/A;  . HYSTERECTOMY ABDOMINAL WITH SALPINGO-OOPHORECTOMY N/A 08/19/2019   Procedure: HYSTERECTOMY ABDOMINAL WITH SALPINGO-OOPHORECTOMY WITH PELVIC WASHINGS PERITONEAL BIOPSIES AND PARA AORTIC LYMPH NODE DISSECTION;  Surgeon: Lake Read, MD;  Location: [facility] ORS;  Service: Gynecology;  Laterality: N/A;  . LEEP    . OMENTECTOMY N/A 08/19/2019   Procedure: OMENTECTOMY;  Surgeon: Lake Read, MD;  Location: [facility] ORS;  Service: Gynecology;  Laterality: N/A;  . OOPHORECTOMY    . PORTA CATH INSERTION N/A 09/11/2019  Procedure: PORTA CATH INSERTION;  Surgeon: Marea Selinda RAMAN, MD;  Location: [facility] INVASIVE CV LAB;  Service: Cardiovascular;  Laterality: N/A;  . PORTA CATH REMOVAL N/A 08/16/2022   Procedure: PORTA CATH REMOVAL;  Surgeon: Marea Selinda RAMAN, MD;  Location: [facility] INVASIVE CV LAB;  Service: Cardiovascular;  Laterality: N/A;  . TUBAL LIGATION  1999    Social History   Socioeconomic History  . Marital status: Single    Spouse name: Not on file  . Number of children: 0  . Years of education: Not on file  . Highest education level:  Associate degree: academic program  Occupational History  . Occupation: Retired  Tobacco Use  . Smoking status: Never    Passive exposure: Never  . Smokeless tobacco: Never  . Tobacco comments:    2bd hand smoke from father  Vaping Use  . Vaping status: Never Used  Substance and Sexual Activity  . Alcohol use: Never    Comment: rarely  . Drug use: Never  . Sexual activity: Not Currently    Birth control/protection: Post-menopausal  Other Topics Concern  . Not on file  Social History Narrative  . Not on file   Social Drivers of Health   Financial Resource Strain: Low Risk  (01/28/2024)   Overall Financial Resource Strain (CARDIA)   . Difficulty of Paying Living Expenses: Not hard at all  Food Insecurity: No Food Insecurity (01/28/2024)   Hunger Vital Sign   . Worried About Programme researcher, broadcasting/film/video in the Last Year: Never true   . Ran Out of Food in the Last Year: Never true  Transportation Needs: No Transportation Needs (01/28/2024)   PRAPARE - Transportation   . Lack of Transportation (Medical): No   . Lack of Transportation (Non-Medical): No  Physical Activity: Inactive (01/28/2024)   Exercise Vital Sign   . Days of Exercise per Week: 0 days   . Minutes of Exercise per Session: Not on file  Stress: No Stress Concern Present (01/28/2024)   [employer] of Occupational Health - Occupational Stress Questionnaire   . Feeling of Stress: Only a little  Social Connections: Socially Isolated (01/28/2024)   Social Connection and Isolation Panel   . Frequency of Communication with Friends and Family: Three times a week   . Frequency of Social Gatherings with Friends and Family: Once a week   . Attends Religious Services: Patient declined   . Active Member of Clubs or Organizations: No   . Attends Banker Meetings: Not on file   . Marital Status: Never married  Intimate Partner Violence: Not At Risk (04/26/2022)   Humiliation, Afraid, Rape, and Kick questionnaire   . Fear of  Current or Ex-Partner: No   . Emotionally Abused: No   . Physically Abused: No   . Sexually Abused: No    Family History  Problem Relation Age of Onset  . Heart failure Mother   . Atrial fibrillation Mother   . Hypertension Mother   . Diabetes Father   . Heart attack Father   . Breast cancer Maternal Aunt 70  . Breast cancer Maternal Grandmother 80  . Breast cancer Cousin        2 mat cousins  . Hypertension Sister   . Diabetes Sister   . Blindness Sister   . Hypertension Sister   . Arthritis Sister   . Leukemia Paternal Aunt   . Leukemia Paternal Uncle   . Brain cancer Paternal Aunt   . Other Brother 22  Drowning accident  . Liver cancer Maternal Uncle   . Alcohol abuse Maternal Uncle   . Prostate cancer Neg Hx   . Kidney cancer Neg Hx   . BRCA 1/2 Neg Hx      Current Outpatient Medications:  .  acetaminophen  (TYLENOL ) 500 MG tablet, Take 1,000 mg by mouth every 6 (six) hours as needed (for pain.)., Disp: , Rfl:  .  diclofenac  (VOLTAREN ) 75 MG EC tablet, Take 1 tablet (75 mg total) by mouth 2 (two) times daily., Disp: 30 tablet, Rfl: 0 .  fexofenadine-pseudoephedrine (ALLEGRA-D 24) 180-240 MG 24 hr tablet, Take 1 tablet by mouth daily., Disp: , Rfl:  .  hydrOXYzine (ATARAX) 25 MG tablet, Take 25 mg by mouth at bedtime., Disp: , Rfl:  .  loratadine (CLARITIN) 10 MG tablet, Take 10 mg by mouth daily. otc, Disp: , Rfl:  .  omeprazole (PRILOSEC) 10 MG capsule, Take 10 mg by mouth daily., Disp: , Rfl:  .  Polyethylene Glycol 3350  (MIRALAX  PO), Take by mouth., Disp: , Rfl:  .  pregabalin  (LYRICA ) 75 MG capsule, TAKE 1 CAPSULE BY MOUTH TWICE A DAY, Disp: 60 capsule, Rfl: 0 .  Probiotic Product (UP4 PROBIOTICS WOMENS PO), Take 1 tablet by mouth daily., Disp: , Rfl:  .  triamcinolone  (KENALOG ) 0.025 % cream, Apply topically 2 (two) times daily., Disp: , Rfl:  .  XYZAL ALLERGY 24HR 5 MG tablet, Take 10 mg by mouth daily., Disp: , Rfl:  .  fluvoxaMINE  (LUVOX ) 25 MG tablet,  Take 1 tablet (25 mg total) by mouth at bedtime., Disp: 30 tablet, Rfl: 1 .  topiramate  (TOPAMAX ) 25 MG capsule, Take 1 capsule (25 mg total) by mouth at bedtime., Disp: 90 capsule, Rfl: 0 .  Vibegron  (GEMTESA ) 75 MG TABS, Take 1 tablet (75 mg total) by mouth daily., Disp: 30 tablet, Rfl: 11  Physical exam:  Vitals:   04/03/24 1314  BP: 137/64  Pulse: 64  Resp: 18  Temp: (!) 96.4 F (35.8 C)  TempSrc: Tympanic  Weight: 189 lb 11.2 oz (86 kg)  Height: 5' 2 (1.575 m)   Physical Exam   I have personally reviewed labs listed below:    Latest Ref Rng & Units 12/15/2023    4:02 AM  CMP  Glucose 70 - 99 mg/dL 861   BUN 8 - 23 mg/dL 12   Creatinine 9.55 - 1.00 mg/dL 9.23   Sodium 864 - 854 mmol/L 137   Potassium 3.5 - 5.1 mmol/L 3.7   Chloride 98 - 111 mmol/L 107   CO2 22 - 32 mmol/L 24   Calcium 8.9 - 10.3 mg/dL 89.6   Total Protein 6.5 - 8.1 g/dL 6.2   Total Bilirubin 0.0 - 1.2 mg/dL 0.7   Alkaline Phos 38 - 126 U/L 80   AST 15 - 41 U/L 21   ALT 0 - 44 U/L 18       Latest Ref Rng & Units 04/03/2024   12:38 PM  CBC  WBC 4.0 - 10.5 K/uL 3.0   Hemoglobin 12.0 - 15.0 g/dL 85.1   Hematocrit 63.9 - 46.0 % 43.7   Platelets 150 - 400 K/uL 226    I have personally reviewed Radiology images listed below: No images are attached to the encounter.  No results found.   Assessment and plan- Patient is a 78 y.o. female ***   Visit Diagnosis 1. Encounter for follow-up surveillance of ovarian cancer   2. Iron deficiency   3.  Chemotherapy-induced peripheral neuropathy (HCC)      Dr. Annah Skene, MD, MPH [facility] at [facility] [phone] 04/03/2024 2:08 PM

## 2024-04-03 NOTE — Progress Notes (Unsigned)
 Patient is doing okay but she has a spot on her left breast that is raised and she would like insight regarding it. She also has stopped a few medications and would like to know results from blood work that was done last month.

## 2024-04-07 ENCOUNTER — Other Ambulatory Visit: Payer: Self-pay | Admitting: Psychiatry

## 2024-04-09 DIAGNOSIS — J300 Vasomotor rhinitis: Secondary | ICD-10-CM | POA: Diagnosis not present

## 2024-04-09 DIAGNOSIS — R21 Rash and other nonspecific skin eruption: Secondary | ICD-10-CM | POA: Diagnosis not present

## 2024-04-29 ENCOUNTER — Ambulatory Visit: Payer: Self-pay

## 2024-04-29 ENCOUNTER — Encounter: Payer: Self-pay | Admitting: Student

## 2024-04-29 ENCOUNTER — Telehealth: Payer: Self-pay | Admitting: Psychiatry

## 2024-04-29 ENCOUNTER — Ambulatory Visit: Admitting: Student

## 2024-04-29 VITALS — BP 132/82 | HR 66 | Temp 97.8°F | Ht 62.0 in | Wt 191.0 lb

## 2024-04-29 DIAGNOSIS — R21 Rash and other nonspecific skin eruption: Secondary | ICD-10-CM | POA: Diagnosis not present

## 2024-04-29 DIAGNOSIS — I700 Atherosclerosis of aorta: Secondary | ICD-10-CM | POA: Diagnosis not present

## 2024-04-29 NOTE — Telephone Encounter (Signed)
 FYI Only or Action Required?: FYI only for provider.  Patient was last seen in primary care on 01/29/2024 by Lemon Raisin, MD.  Called Nurse Triage reporting Pain.  Symptoms began x 2 days ago.  Interventions attempted: OTC medications: allergy medicine and benadryl .  Symptoms are: gradually worsening.  Triage Disposition: See HCP Within 4 Hours (Or PCP Triage)  Patient/caregiver understands and will follow disposition?: Yes    Copied from CRM #[id number]. Topic: Clinical - Red Word Triage >> Apr 29, 2024 10:20 AM Emylou G wrote: [facility] that prompted transfer to Nurse Triage: Bite, swollen / red /warm ( front of arm near elbow ) itching Reason for Disposition  [1] Red area or streak AND [2] large (> 2 inches or 5 cm)  Answer Assessment - Initial Assessment Questions 1. ONSET: When did the pain start?     X 2 days  2. LOCATION: Where is the pain located?     Near right elbow 3. PAIN: How bad is the pain? (Scale 0-10; or none, mild, moderate, severe)    no 4. WORK OR EXERCISE: Has there been any recent work or exercise that involved this part of the body?     no 5. CAUSE: What do you think is causing the arm pain?     Possible insect bite 6. OTHER SYMPTOMS: Do you have any other symptoms? (e.g., neck pain, swelling, rash, fever, numbness, weakness)     Swelling, redness, itching, tingling,  7. PREGNANCY: Is there any chance you are pregnant? When was your last menstrual period?     na  Size of silver dollar : pt thinks possible insect concerned r/t lyme disease.  Pt woke up with redness, swelling, warm to touch  Protocols used: Arm Pain-A-AH

## 2024-04-29 NOTE — Telephone Encounter (Signed)
 Noted  Pt has appt.  KP

## 2024-04-29 NOTE — Telephone Encounter (Signed)
 Patient left voice message 04-29-24 stating to cancel her appointment on 05-07-24 and that she is not rescheduling at this time.

## 2024-04-29 NOTE — Progress Notes (Unsigned)
 Established Patient Office Visit  Subjective   Patient ID: Carrie Roberson, female    DOB: May 06, 1946  Age: 78 y.o. MRN: [id number]  Chief Complaint  Patient presents with   Rash    X 2 days, right forearm, woke up at night with the rash, hot and itchy     [name] with medical hx listed below presents today for rash on the right forearm. Woke up around 2 am with redness, itching, and pain 2 nights ago. Redness extended along anterior forearm to the wrist. Has used antibiotic cream on the area. Continues ot have redness, and itching in the area but now limited to small area near elbow. Thinks she has a insect vit, reports many spiders around the house and was outside walking dog the evening prior to when rash started. Did not see if anything bit here, no tick attachment, has not had issues with ticks around where she lives in the past. Denies fever, chills, joint pain, bleeding, or injury.   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   diclofenac  (VOLTAREN ) 75 MG EC tablet Take 1 tablet (75 mg total) by mouth 2 (two) times daily.   fexofenadine-pseudoephedrine (ALLEGRA-D 24) 180-240 MG 24 hr tablet Take 1 tablet by mouth daily.   loratadine (CLARITIN) 10 MG tablet Take 10 mg by mouth daily. otc   omeprazole (PRILOSEC) 10 MG capsule Take 10 mg by mouth daily.   Polyethylene Glycol 3350  (MIRALAX  PO) Take by mouth.   pregabalin  (LYRICA ) 75 MG capsule TAKE 1 CAPSULE BY MOUTH TWICE A DAY   Probiotic Product (UP4 PROBIOTICS WOMENS PO) Take 1 tablet by mouth daily.   triamcinolone  (KENALOG ) 0.025 % cream Apply topically 2 (two) times daily.   XYZAL ALLERGY 24HR 5 MG tablet Take 10 mg by mouth daily.   fluvoxaMINE  (LUVOX ) 25 MG tablet Take 1 tablet (25 mg total) by mouth at bedtime.   hydrOXYzine (ATARAX) 25 MG tablet Take 25 mg by mouth at bedtime. (Patient not taking: Reported on 04/29/2024)   topiramate  (TOPAMAX ) 25 MG capsule Take 1 capsule (25 mg total) by mouth at bedtime.   Vibegron  (GEMTESA ) 75 MG TABS Take 1 tablet (75 mg total) by mouth daily.   No facility-administered encounter medications on file as of 04/29/2024.    BP 132/82   Pulse 66   Temp 97.8 F (36.6 C) (Oral)   Ht 5' 2 (1.575 m)   Wt 191 lb (86.6 kg)   SpO2 95%   BMI  34.93 kg/m  BP Readings from Last 3 Encounters:  04/29/24 132/82  04/03/24 137/64  02/26/24 (!) 145/69    Physical Exam Constitutional:      Appearance: Normal appearance.  HENT:     Mouth/Throat:     Mouth: Mucous membranes are moist.     Pharynx: Oropharynx is clear.  Cardiovascular:     Rate and Rhythm: Normal rate and regular rhythm.  Pulmonary:     Effort: Pulmonary effort is normal.     Breath sounds: No rhonchi or rales.  Abdominal:     General: Abdomen is flat. Bowel sounds are normal. There is no distension.     Palpations: Abdomen is soft.     Tenderness: There is no abdominal tenderness.  Musculoskeletal:        General:  Normal range of motion.     Right lower leg: No edema.     Left lower leg: No edema.  Skin:    Capillary Refill: Capillary refill takes less than 2 seconds.     Comments: 2 cm area of erythema with central punctum. No bleeding, induration, drainage,  or significant warmth   Neurological:     General: No focal deficit present.     Mental Status: She is alert and oriented to person, place, and time.  Psychiatric:        Mood and Affect: Mood normal.        Behavior: Behavior normal.        04/03/2024    1:11 PM 01/29/2024    3:55 PM 11/19/2023    1:02 PM  Depression screen PHQ 2/9  Decreased Interest 0 1   Down, Depressed, Hopeless 0 0   PHQ - 2 Score 0 1   Altered sleeping 0 2   Tired, decreased energy 0 0   Change in appetite 0 1   Feeling bad or failure about yourself  0 0   Trouble concentrating 0 0   Moving slowly or fidgety/restless 0 0   Suicidal thoughts 0 0   PHQ-9 Score 0 4   Difficult doing work/chores  Not difficult at all      Information is confidential and restricted. Go to Review Flowsheets to unlock data.       01/29/2024    3:55 PM 11/19/2023    1:05 PM 08/05/2023   12:48 PM 03/21/2023    1:50 PM  GAD 7 : Generalized Anxiety Score  Nervous, Anxious, on Edge 2   0  Control/stop worrying 0   0  Worry too much - different things 0   0  Trouble relaxing 1   0  Restless 1   0  Easily annoyed or irritable 0   0  Afraid - awful might happen 0   0  Total GAD 7 Score 4   0  Anxiety Difficulty Not difficult at all   Not difficult at all     Information is confidential and restricted. Go to Review Flowsheets to unlock data.    No results found for any visits on 04/29/24.  Last CBC Lab Results  Component Value Date   WBC 3.0 (L) 04/03/2024   HGB 14.8 04/03/2024   HCT 43.7 04/03/2024   MCV 89.2 04/03/2024   MCH 30.2 04/03/2024   RDW 13.1 04/03/2024   PLT 226 04/03/2024   Last metabolic panel Lab Results  Component Value Date   GLUCOSE 138 (H)  12/15/2023   NA 137 12/15/2023   K 3.7 12/15/2023  CL 107 12/15/2023   CO2 24 12/15/2023   BUN 12 12/15/2023   CREATININE 0.76 12/15/2023   GFRNONAA >60 12/15/2023   CALCIUM 10.3 12/15/2023   PHOS 3.2 07/28/2021   PROT 6.2 (L) 12/15/2023   ALBUMIN 3.7 12/15/2023   BILITOT 0.7 12/15/2023   ALKPHOS 80 12/15/2023   AST 21 12/15/2023   ALT 18 12/15/2023   ANIONGAP 6 12/15/2023   Last lipids Lab Results  Component Value Date   CHOL 188 09/02/2017   HDL 53 09/02/2017   LDLCALC 109 (H) 09/02/2017   TRIG 131 09/02/2017   CHOLHDL 3.5 09/02/2017   Last hemoglobin A1c No results found for: HGBA1C    The ASCVD Risk score (Arnett DK, et al., 2019) failed to calculate for the following reasons:   The 2019 ASCVD risk score is only valid for ages 40 to 77    Assessment & Plan:  Rash, skin Rah of the right anterior forearm near elbow likely secondary to insect/arthropod bite. Unlikely to be lyme given history.  Treat itching with ice/calamine lotion, no signs of infection. Follow up as needed   No follow-ups on file.    Harlene Saddler, MD

## 2024-04-30 ENCOUNTER — Other Ambulatory Visit: Payer: Self-pay

## 2024-04-30 ENCOUNTER — Ambulatory Visit: Payer: Self-pay | Admitting: Student

## 2024-04-30 LAB — LIPID PANEL
Chol/HDL Ratio: 3.5 ratio (ref 0.0–4.4)
Cholesterol, Total: 188 mg/dL (ref 100–199)
HDL: 54 mg/dL (ref >39)
LDL Chol Calc (NIH): 114 mg/dL — ABNORMAL HIGH (ref 0–99)
Triglycerides: 109 mg/dL (ref 0–149)
VLDL Cholesterol Cal: 20 mg/dL (ref 5–40)

## 2024-04-30 MED ORDER — ATORVASTATIN CALCIUM 20 MG PO TABS: 20.0000 mg | ORAL_TABLET | Freq: Every day | ORAL | 0 refills | Status: AC

## 2024-05-05 DIAGNOSIS — L508 Other urticaria: Secondary | ICD-10-CM | POA: Diagnosis not present

## 2024-05-05 DIAGNOSIS — L821 Other seborrheic keratosis: Secondary | ICD-10-CM | POA: Diagnosis not present

## 2024-05-06 ENCOUNTER — Ambulatory Visit: Admitting: Student

## 2024-05-07 ENCOUNTER — Ambulatory Visit: Admitting: Psychiatry

## 2024-05-18 ENCOUNTER — Other Ambulatory Visit: Payer: Self-pay | Admitting: Nurse Practitioner

## 2024-05-27 ENCOUNTER — Ambulatory Visit: Admitting: Student

## 2024-06-08 ENCOUNTER — Other Ambulatory Visit: Payer: Self-pay | Admitting: Obstetrics and Gynecology

## 2024-06-08 DIAGNOSIS — Z1231 Encounter for screening mammogram for malignant neoplasm of breast: Secondary | ICD-10-CM

## 2024-06-11 ENCOUNTER — Ambulatory Visit: Admitting: Physician Assistant

## 2024-06-11 VITALS — BP 154/76 | HR 67

## 2024-06-11 DIAGNOSIS — N390 Urinary tract infection, site not specified: Secondary | ICD-10-CM | POA: Diagnosis not present

## 2024-06-11 DIAGNOSIS — N3281 Overactive bladder: Secondary | ICD-10-CM | POA: Diagnosis not present

## 2024-06-11 LAB — URINALYSIS, COMPLETE
Bilirubin, UA: NEGATIVE
Glucose, UA: NEGATIVE
Ketones, UA: NEGATIVE
Nitrite, UA: POSITIVE — AB
Protein,UA: NEGATIVE
Specific Gravity, UA: 1.01 (ref 1.005–1.030)
Urobilinogen, Ur: 1 mg/dL (ref 0.2–1.0)
pH, UA: 6 (ref 5.0–7.5)

## 2024-06-11 LAB — MICROSCOPIC EXAMINATION: Epithelial Cells (non renal): >10 /HPF — AB (ref 0–10)

## 2024-06-11 MED ORDER — NITROFURANTOIN MONOHYD MACRO 100 MG PO CAPS: 100.0000 mg | ORAL_CAPSULE | Freq: Two times a day (BID) | ORAL | 0 refills | Status: AC

## 2024-06-11 NOTE — Progress Notes (Signed)
 06/11/2024 3:45 PM   Carrie Roberson [birth year] [id number]  CC: Chief Complaint  Patient presents with   Recurrent UTI   HPI: Carrie Roberson is a 78 y.o. 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 difficile colitis, OAB wet with mixed incontinence who failed Myrbetriq  due to cost, recurrent UTI previously on suppressive Macrobid , and microscopic hematuria with a benign workup earlier this year who presents today for evaluation of possible UTI.   Today she reports 5 to 7 days of dysuria and bladder pressure.  I previously provided her with Gemtesa  samples for her OAB.  She states that it helped somewhat, but was cost prohibitive.  She feels that she can live with her symptoms as is and would like to defer additional pharmacotherapy for now.  Notably, she does have a history of dry mouth in addition to her IBS with constipation and diarrhea.  In-office UA today positive for trace intact blood, nitrites, and 1+ leukocytes; urine microscopy with 11-30 WBCs/HPF, 3-10 RBCs/HPF, >10 epithelial cells/hpf, and moderate bacteria.  PMH: Past Medical History:  Diagnosis Date   Anxiety    Arthritis    Diverticulitis    Family history of breast cancer    Family history of leukemia    GERD (gastroesophageal reflux disease)    History of ischemic colitis 09/02/2017   Hx of colonic polyp    Hypertension    H/O-PT TAKEN OFF BY PCP AS OF 2019   IBS (irritable bowel syndrome)    Ovarian cancer (HCC)    Ovarian cyst    Personal history of chemotherapy 2021   6 treatments   Vaginal atrophy     Surgical History: Past Surgical History:  Procedure Laterality Date   ABDOMINAL HYSTERECTOMY     BREAST BIOPSY Left    needle bx/clip-neg   BREAST BIOPSY Right 06/28/2021   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   CERVICAL BIOPSY  W/ LOOP ELECTRODE EXCISION     CHOLECYSTECTOMY     Colonoscopoy   2007, 2008, 2011   COLONOSCOPY WITH PROPOFOL  N/A 05/05/2018   Procedure: COLONOSCOPY WITH PROPOFOL ;  Surgeon: Unk Corinn Skiff, MD;  Location: [facility];  Service: Gastroenterology;  Laterality: N/A;   FLEXIBLE SIGMOIDOSCOPY N/A 07/20/2021   Procedure: FLEXIBLE SIGMOIDOSCOPY;  Surgeon: Maryruth Ole DASEN, MD;  Location: [facility];  Service: Endoscopy;  Laterality: N/A;   HYSTERECTOMY ABDOMINAL WITH SALPINGO-OOPHORECTOMY N/A 08/19/2019   Procedure: HYSTERECTOMY ABDOMINAL WITH SALPINGO-OOPHORECTOMY WITH PELVIC WASHINGS PERITONEAL BIOPSIES AND PARA AORTIC LYMPH NODE DISSECTION;  Surgeon: Lake Read, MD;  Location: [facility];  Service: Gynecology;  Laterality: N/A;   LEEP     OMENTECTOMY N/A 08/19/2019   Procedure: OMENTECTOMY;  Surgeon: Lake Read, MD;  Location: [facility];  Service: Gynecology;  Laterality: N/A;   OOPHORECTOMY     PORTA CATH INSERTION N/A 09/11/2019   Procedure: PORTA CATH INSERTION;  Surgeon: Marea Selinda RAMAN, MD;  Location: [facility];  Service: Cardiovascular;  Laterality: N/A;   PORTA CATH REMOVAL N/A 08/16/2022   Procedure: PORTA CATH REMOVAL;  Surgeon: Marea Selinda RAMAN, MD;  Location: [facility];  Service: Cardiovascular;  Laterality: N/A;   TUBAL LIGATION  1999    Home Medications:  Allergies as of 06/11/2024       Reactions   Citalopram  Nausea And Vomiting   Ivp Dye [iodinated Contrast Media] Hives, Swelling   hives   Meloxicam  unknown   Adhesive [tape] Rash   Amlodipine Rash   Ampicillin Rash   Did it involve swelling of the face/tongue/throat, SOB, or low BP? No Did it involve sudden or severe rash/hives, skin peeling, or any reaction on the inside of your mouth or nose? Yes Did you need to seek medical attention at a hospital or doctor's office? Yes When did it last happen?       10 + years If all above answers are "NO", may proceed with cephalosporin use.   Cefoxitin Rash   Chlorhexidine Rash   Chloraprep Scrub  Teal   Estradiol Rash   Estropipate Rash   Olmesartan Medoxomil-hctz Rash   Sertraline Rash      Sulfa Antibiotics Rash   Xolair [omalizumab] Rash        Medication List        Accurate as of June 11, 2024  3:45 PM. If you have any questions, ask your nurse or doctor.          acetaminophen  500 MG tablet Commonly known as: TYLENOL  Take 1,000 mg by mouth every 6 (six) hours as needed (for pain.).   atorvastatin 20 MG tablet Commonly known as: LIPITOR Take 1 tablet (20 mg total) by mouth daily.   diclofenac  75 MG EC tablet Commonly known as: VOLTAREN  Take 1 tablet (75 mg total) by mouth 2 (two) times daily.   fexofenadine-pseudoephedrine 180-240 MG 24 hr tablet Commonly known as: ALLEGRA-D 24 Take 1 tablet by mouth daily.   fluvoxaMINE  25 MG tablet Commonly known as: LUVOX  Take 1 tablet (25 mg total) by mouth at bedtime.   Gemtesa  75 MG Tabs Generic drug: Vibegron  Take 1 tablet (75 mg total) by mouth daily.   hydrOXYzine 25 MG tablet Commonly known as: ATARAX Take 25 mg by mouth at bedtime.   loratadine 10 MG tablet Commonly known as: CLARITIN Take 10 mg by mouth daily. otc   MIRALAX  PO Take by mouth.   omeprazole 10 MG capsule Commonly known as: PRILOSEC Take 10 mg by mouth daily.   pregabalin  75 MG capsule Commonly known as: LYRICA  TAKE 1 CAPSULE BY MOUTH TWICE A DAY   topiramate  25 MG capsule Commonly known as: TOPAMAX  Take 1 capsule (25 mg total) by mouth at bedtime.   triamcinolone  0.025 % cream Commonly known as: KENALOG  Apply topically 2 (two) times daily.   UP4 PROBIOTICS WOMENS PO Take 1 tablet by mouth daily.   Xyzal Allergy 24HR 5 MG tablet Generic drug: levocetirizine Take 10 mg by mouth daily.        Allergies:  Allergies  Allergen Reactions   Citalopram  Nausea And Vomiting   Ivp Dye [Iodinated Contrast Media] Hives and Swelling    hives   Meloxicam     unknown   Adhesive [Tape] Rash   Amlodipine Rash    Ampicillin Rash    Did it involve swelling of the face/tongue/throat, SOB, or low BP? No Did it involve sudden or severe rash/hives, skin peeling, or any reaction on the inside of your mouth or nose? Yes Did you need to seek medical attention at a hospital or doctor's office? Yes When did it last happen?       10 + years If all above answers are "NO", may proceed with cephalosporin use.   Cefoxitin Rash   Chlorhexidine Rash    Chloraprep Scrub Teal   Estradiol Rash   Estropipate Rash   Olmesartan Medoxomil-Hctz Rash   Sertraline Rash  Sulfa Antibiotics Rash   Xolair [Omalizumab] Rash    Family History: Family History  Problem Relation Age of Onset   Heart failure Mother    Atrial fibrillation Mother    Hypertension Mother    Diabetes Father    Heart attack Father    Breast cancer Maternal Aunt 21   Breast cancer Maternal Grandmother 80   Breast cancer Cousin        2 mat cousins   Hypertension Sister    Diabetes Sister    Blindness Sister    Hypertension Sister    Arthritis Sister    Leukemia Paternal Aunt    Leukemia Paternal Uncle    Brain cancer Paternal Aunt    Other Brother 13       Drowning accident   Liver cancer Maternal Uncle    Alcohol abuse Maternal Uncle    Prostate cancer Neg Hx    Kidney cancer Neg Hx    BRCA 1/2 Neg Hx     Social History:   reports that she has never smoked. She has never been exposed to tobacco smoke. She has never used smokeless tobacco. She reports that she does not drink alcohol and does not use drugs.  Physical Exam: There were no vitals taken for this visit.  Constitutional:  Alert and oriented, no acute distress, nontoxic appearing HEENT: [place], AT Cardiovascular: No clubbing, cyanosis, or edema Respiratory: Normal respiratory effort, no increased work of breathing Skin: No rashes, bruises or suspicious lesions Neurologic: Grossly intact, no focal deficits, moving all 4 extremities Psychiatric: Normal mood and  affect  Laboratory Data: Results for orders placed or performed in visit on 06/11/24  Microscopic Examination   Collection Time: 06/11/24  3:26 PM   Urine  Result Value Ref Range   WBC, UA 11-30 (A) 0 - 5 /hpf   RBC, Urine 3-10 (A) 0 - 2 /hpf   Epithelial Cells (non renal) >10 (A) 0 - 10 /hpf   Mucus, UA Present (A) Not Estab.   Bacteria, UA Moderate (A) None seen/Few  Urinalysis, Complete   Collection Time: 06/11/24  3:26 PM  Result Value Ref Range   Specific Gravity, UA 1.010 1.005 - 1.030   pH, UA 6.0 5.0 - 7.5   Color, UA Yellow Yellow   Appearance Ur Slightly cloudy Clear   Leukocytes,UA 1+ (A) Negative   Protein,UA Negative Negative/Trace   Glucose, UA Negative Negative   Ketones, UA Negative Negative   RBC, UA Trace (A) Negative   Bilirubin, UA Negative Negative   Urobilinogen, Ur 1.0 0.2 - 1.0 mg/dL   Nitrite, UA Positive (A) Negative   Microscopic Examination See below:    Assessment & Plan:   1. Recurrent UTI (Primary) UA appears grossly infected, will start empiric Macrobid  and send for culture. - Urinalysis, Complete - CULTURE, URINE COMPREHENSIVE - nitrofurantoin , macrocrystal-monohydrate, (MACROBID ) 100 MG capsule; Take 1 capsule (100 mg total) by mouth 2 (two) times daily for 5 days.  Dispense: 10 capsule; Refill: 0  2. OAB (overactive bladder) Antimuscarinics are contraindicated in the setting of her baseline constipation and dry mouth.  Beta 3 agonists are cost prohibitive, though effective.  Will stay off pharmacotherapy for now but consider resuming beta 3 agonist versus third line therapies in the future.  Return if symptoms worsen or fail to improve.  Lucie Hones, PA-C  [facility] [place] [address], [address] [place], [place] [address] [phone]

## 2024-06-12 MED ORDER — FLUCONAZOLE 150 MG PO TABS: 150.0000 mg | ORAL_TABLET | Freq: Once | ORAL | 0 refills | Status: AC

## 2024-06-12 NOTE — Addendum Note (Signed)
 Addended byBETHA CORIE PLATER on: 06/12/2024 10:27 AM   Modules accepted: Orders

## 2024-06-15 ENCOUNTER — Ambulatory Visit: Payer: Self-pay | Admitting: Physician Assistant

## 2024-06-15 LAB — CULTURE, URINE COMPREHENSIVE

## 2024-06-25 ENCOUNTER — Ambulatory Visit: Admitting: Physician Assistant

## 2024-06-25 ENCOUNTER — Encounter: Payer: Self-pay | Admitting: Physician Assistant

## 2024-06-25 VITALS — BP 179/98 | HR 81 | Ht 62.0 in | Wt 189.0 lb

## 2024-06-25 DIAGNOSIS — N390 Urinary tract infection, site not specified: Secondary | ICD-10-CM | POA: Diagnosis not present

## 2024-06-25 LAB — URINALYSIS, COMPLETE
Bilirubin, UA: NEGATIVE
Glucose, UA: NEGATIVE
Ketones, UA: NEGATIVE
Nitrite, UA: POSITIVE — AB
Specific Gravity, UA: 1.02 (ref 1.005–1.030)
Urobilinogen, Ur: 1 mg/dL (ref 0.2–1.0)
pH, UA: 6 (ref 5.0–7.5)

## 2024-06-25 LAB — MICROSCOPIC EXAMINATION
Epithelial Cells (non renal): >10 /HPF — AB (ref 0–10)
WBC, UA: >30 /HPF — AB (ref 0–5)

## 2024-06-25 MED ORDER — DOXYCYCLINE HYCLATE 100 MG PO CAPS: 100.0000 mg | ORAL_CAPSULE | Freq: Two times a day (BID) | ORAL | 0 refills | Status: DC

## 2024-06-25 MED ORDER — FLUCONAZOLE 150 MG PO TABS: 150.0000 mg | ORAL_TABLET | Freq: Once | ORAL | 0 refills | Status: AC

## 2024-06-25 NOTE — Progress Notes (Signed)
 06/25/2024 9:32 AM   Carrie Roberson 1946/07/02 [id number]  CC: Chief Complaint  Patient presents with   Recurrent UTI   HPI: Carrie Roberson is a 78 y.o. 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 difficile colitis, OAB wet with mixed incontinence who failed Myrbetriq  due to cost, recurrent UTI previously on suppressive Macrobid , and microscopic hematuria with a benign workup earlier this year who presents today for evaluation of recurrent versus persistent UTI.  I saw her in clinic most recently on 06/11/2024 for evaluation of UTI.  Her UA was positive, though contaminated.  Urine culture grew Citrobacter koseri and I treated her with 5 days of culture appropriate Macrobid .   Today she reports her symptoms completely resolved with Macrobid , and she developed recurrent dysuria and urgency 3 to 4 days ago as well as low back pain, which she admits may be musculoskeletal in origin.  She is having some nausea this morning, but denies fevers.  She remains on a lactobacillus containing probiotic daily for UTI prevention.  She denies pneumaturia or fecaluria.  In-office UA today positive for trace protein, trace intact blood, nitrates, and 1+ leukocytes; urine microscopy with >30 WBCs/HPF, 3-10 RBCs/HPF, >10 epithelial cells/hpf, and moderate bacteria.  PMH: Past Medical History:  Diagnosis Date   Anxiety    Arthritis    Diverticulitis    Family history of breast cancer    Family history of leukemia    GERD (gastroesophageal reflux disease)    History of ischemic colitis 09/02/2017   Hx of colonic polyp    Hypertension    H/O-PT TAKEN OFF BY PCP AS OF 2019   IBS (irritable bowel syndrome)    Ovarian cancer (HCC)    Ovarian cyst    Personal history of chemotherapy 2021   6 treatments   Vaginal atrophy     Surgical History: Past Surgical History:  Procedure Laterality Date   ABDOMINAL  HYSTERECTOMY     BREAST BIOPSY Left    needle bx/clip-neg   BREAST BIOPSY Right 06/28/2021   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   CERVICAL BIOPSY  W/ LOOP ELECTRODE EXCISION     CHOLECYSTECTOMY     Colonoscopoy  2007, 2008, 2011   COLONOSCOPY WITH PROPOFOL  N/A 05/05/2018   Procedure: COLONOSCOPY WITH PROPOFOL ;  Surgeon: Unk Corinn Skiff, MD;  Location: [facility];  Service: Gastroenterology;  Laterality: N/A;   FLEXIBLE SIGMOIDOSCOPY N/A 07/20/2021   Procedure: FLEXIBLE SIGMOIDOSCOPY;  Surgeon: Maryruth Ole DASEN, MD;  Location: [facility];  Service: Endoscopy;  Laterality: N/A;   HYSTERECTOMY ABDOMINAL WITH SALPINGO-OOPHORECTOMY N/A 08/19/2019   Procedure: HYSTERECTOMY ABDOMINAL WITH SALPINGO-OOPHORECTOMY WITH PELVIC WASHINGS PERITONEAL BIOPSIES AND PARA AORTIC LYMPH NODE DISSECTION;  Surgeon: Lake Read, MD;  Location: [facility];  Service: Gynecology;  Laterality: N/A;   LEEP     OMENTECTOMY N/A 08/19/2019   Procedure: OMENTECTOMY;  Surgeon: Lake Read, MD;  Location: [facility];  Service: Gynecology;  Laterality: N/A;   OOPHORECTOMY     PORTA CATH INSERTION N/A 09/11/2019   Procedure: PORTA CATH INSERTION;  Surgeon: Marea Selinda RAMAN, MD;  Location: [facility];  Service: Cardiovascular;  Laterality: N/A;   PORTA CATH REMOVAL N/A 08/16/2022   Procedure: PORTA CATH REMOVAL;  Surgeon: Marea Selinda RAMAN, MD;  Location: [facility];  Service: Cardiovascular;  Laterality: N/A;   TUBAL LIGATION  1999    Home Medications:  Allergies as of  06/25/2024       Reactions   Citalopram  Nausea And Vomiting   Ivp Dye [iodinated Contrast Media] Hives, Swelling   hives   Meloxicam    unknown   Adhesive [tape] Rash   Amlodipine Rash   Ampicillin Rash   Did it involve swelling of the face/tongue/throat, SOB, or low BP? No Did it involve sudden or severe rash/hives, skin peeling, or any reaction on the inside of your mouth or nose? Yes Did you need to  seek medical attention at a hospital or doctor's office? Yes When did it last happen?       10 + years If all above answers are "NO", may proceed with cephalosporin use.   Cefoxitin Rash   Chlorhexidine Rash   Chloraprep Scrub Teal   Estradiol Rash   Estropipate Rash   Olmesartan Medoxomil-hctz Rash   Sertraline Rash      Sulfa Antibiotics Rash   Xolair [omalizumab] Rash        Medication List        Accurate as of June 25, 2024  9:32 AM. If you have any questions, ask your nurse or doctor.          acetaminophen  500 MG tablet Commonly known as: TYLENOL  Take 1,000 mg by mouth every 6 (six) hours as needed (for pain.).   atorvastatin  20 MG tablet Commonly known as: LIPITOR Take 1 tablet (20 mg total) by mouth daily.   diclofenac  75 MG EC tablet Commonly known as: VOLTAREN  Take 1 tablet (75 mg total) by mouth 2 (two) times daily.   fexofenadine-pseudoephedrine 180-240 MG 24 hr tablet Commonly known as: ALLEGRA-D 24 Take 1 tablet by mouth daily.   hydrOXYzine 25 MG tablet Commonly known as: ATARAX Take 25 mg by mouth at bedtime.   loratadine 10 MG tablet Commonly known as: CLARITIN Take 10 mg by mouth daily. otc   MIRALAX  PO Take by mouth.   omeprazole 10 MG capsule Commonly known as: PRILOSEC Take 10 mg by mouth daily.   pregabalin  75 MG capsule Commonly known as: LYRICA  TAKE 1 CAPSULE BY MOUTH TWICE A DAY   triamcinolone  0.025 % cream Commonly known as: KENALOG  Apply topically 2 (two) times daily.   UP4 PROBIOTICS WOMENS PO Take 1 tablet by mouth daily.   Xyzal Allergy 24HR 5 MG tablet Generic drug: levocetirizine Take 10 mg by mouth daily.        Allergies:  Allergies  Allergen Reactions   Citalopram  Nausea And Vomiting   Ivp Dye [Iodinated Contrast Media] Hives and Swelling    hives   Meloxicam     unknown   Adhesive [Tape] Rash   Amlodipine Rash   Ampicillin Rash    Did it involve swelling of the face/tongue/throat, SOB, or  low BP? No Did it involve sudden or severe rash/hives, skin peeling, or any reaction on the inside of your mouth or nose? Yes Did you need to seek medical attention at a hospital or doctor's office? Yes When did it last happen?       10 + years If all above answers are "NO", may proceed with cephalosporin use.   Cefoxitin Rash   Chlorhexidine Rash    Chloraprep Scrub Teal   Estradiol Rash   Estropipate Rash   Olmesartan Medoxomil-Hctz Rash   Sertraline Rash        Sulfa Antibiotics Rash   Xolair [Omalizumab] Rash    Family History: Family History  Problem Relation Age of Onset  Heart failure Mother    Atrial fibrillation Mother    Hypertension Mother    Diabetes Father    Heart attack Father    Breast cancer Maternal Aunt 28   Breast cancer Maternal Grandmother 58   Breast cancer Cousin        2 mat cousins   Hypertension Sister    Diabetes Sister    Blindness Sister    Hypertension Sister    Arthritis Sister    Leukemia Paternal Aunt    Leukemia Paternal Uncle    Brain cancer Paternal Aunt    Other Brother 13       Drowning accident   Liver cancer Maternal Uncle    Alcohol abuse Maternal Uncle    Prostate cancer Neg Hx    Kidney cancer Neg Hx    BRCA 1/2 Neg Hx     Social History:   reports that she has never smoked. She has never been exposed to tobacco smoke. She has never used smokeless tobacco. She reports that she does not drink alcohol and does not use drugs.  Physical Exam: BP (!) 179/98   Pulse 81   Ht 5' 2 (1.575 m)   Wt 189 lb (85.7 kg)   BMI 34.57 kg/m   Constitutional:  Alert and oriented, no acute distress, nontoxic appearing HEENT: [place], AT Cardiovascular: No clubbing, cyanosis, or edema Respiratory: Normal respiratory effort, no increased work of breathing Skin: No rashes, bruises or suspicious lesions Neurologic: Grossly intact, no focal deficits, moving all 4 extremities Psychiatric: Normal mood and affect  Laboratory Data: Results  for orders placed or performed in visit on 06/25/24  Microscopic Examination   Collection Time: 06/25/24  9:23 AM   Urine  Result Value Ref Range   WBC, UA >30 (A) 0 - 5 /hpf   RBC, Urine 3-10 (A) 0 - 2 /hpf   Epithelial Cells (non renal) >10 (A) 0 - 10 /hpf   Casts Present (A) None seen /lpf   Cast Type Hyaline casts N/A   Mucus, UA Present (A) Not Estab.   Bacteria, UA Moderate (A) None seen/Few  Urinalysis, Complete   Collection Time: 06/25/24  9:23 AM  Result Value Ref Range   Specific Gravity, UA 1.020 1.005 - 1.030   pH, UA 6.0 5.0 - 7.5   Color, UA Yellow Yellow   Appearance Ur Clear Clear   Leukocytes,UA 1+ (A) Negative   Protein,UA Trace Negative/Trace   Glucose, UA Negative Negative   Ketones, UA Negative Negative   RBC, UA Trace (A) Negative   Bilirubin, UA Negative Negative   Urobilinogen, Ur 1.0 0.2 - 1.0 mg/dL   Nitrite, UA Positive (A) Negative   Microscopic Examination See below:    Assessment & Plan:   1. Recurrent UTI (Primary) Recurrent versus persistent UTI.  Will start empiric doxycycline  x 7 days for increased tissue penetration and send for culture.  I am also sending in empiric Diflucan  per patient request.  We discussed consideration of starting suppressive antibiotics, and she prefers to defer this for now.  If she has another UTI soon, we will start them then.  I think this is reasonable. - Urinalysis, Complete - CULTURE, URINE COMPREHENSIVE - fluconazole  (DIFLUCAN ) 150 MG tablet; Take 1 tablet (150 mg total) by mouth once for 1 dose.  Dispense: 1 tablet; Refill: 0 - doxycycline  (VIBRAMYCIN ) 100 MG capsule; Take 1 capsule (100 mg total) by mouth 2 (two) times daily with a meal for 7 days.  Dispense: 14 capsule; Refill: 0   Return if symptoms worsen or fail to improve.  Lucie Hones, PA-C  [facility] [place] [address], [address] [place], [place] [address] [phone]

## 2024-06-26 ENCOUNTER — Telehealth: Payer: Self-pay

## 2024-06-26 ENCOUNTER — Ambulatory Visit: Admitting: Student

## 2024-06-26 DIAGNOSIS — N390 Urinary tract infection, site not specified: Secondary | ICD-10-CM

## 2024-06-26 MED ORDER — ONDANSETRON 4 MG PO TBDP: 4.0000 mg | ORAL_TABLET | Freq: Three times a day (TID) | ORAL | 0 refills | Status: AC | PRN

## 2024-06-26 MED ORDER — CEFDINIR 300 MG PO CAPS: 300.0000 mg | ORAL_CAPSULE | Freq: Two times a day (BID) | ORAL | 0 refills | Status: AC

## 2024-06-26 NOTE — Telephone Encounter (Signed)
 Discontinue the doxycycline .  I added it to her allergies list, though this is more an intolerance and then an allergy.  I sent in 4 doses of sublingual Zofran  to help her nausea resolved.  I am switching her to Omnicef , and I just sent that in as well.  I do not anticipate any cross-reactivity between her ampicillin and cefoxitin allergies and this particular cephalosporin.

## 2024-06-26 NOTE — Telephone Encounter (Signed)
 Let patient know we were switching her antibiotics and that we sent in zofran  for her. I also suggested trying to eat some saltines to help settle her stomach since she has not ate anything yet this morning. Pt was agreeable.

## 2024-06-26 NOTE — Telephone Encounter (Signed)
 Pt c/o nausea after taking doxycycline  last night with food. Pt still nauseated today. She is requesting a different antibiotic

## 2024-07-01 DIAGNOSIS — Z8543 Personal history of malignant neoplasm of ovary: Secondary | ICD-10-CM | POA: Diagnosis not present

## 2024-07-01 DIAGNOSIS — Z1331 Encounter for screening for depression: Secondary | ICD-10-CM | POA: Diagnosis not present

## 2024-07-01 DIAGNOSIS — N952 Postmenopausal atrophic vaginitis: Secondary | ICD-10-CM | POA: Diagnosis not present

## 2024-07-01 DIAGNOSIS — Z08 Encounter for follow-up examination after completed treatment for malignant neoplasm: Secondary | ICD-10-CM | POA: Diagnosis not present

## 2024-07-01 DIAGNOSIS — N390 Urinary tract infection, site not specified: Secondary | ICD-10-CM | POA: Diagnosis not present

## 2024-07-01 LAB — CULTURE, URINE COMPREHENSIVE

## 2024-07-07 ENCOUNTER — Ambulatory Visit: Payer: Self-pay | Admitting: Physician Assistant

## 2024-07-13 ENCOUNTER — Ambulatory Visit: Payer: Self-pay | Admitting: *Deleted

## 2024-07-13 NOTE — Telephone Encounter (Signed)
 FYI Only or Action Required?: FYI only for provider: appointment scheduled on 12/23.  Patient was last seen in primary care on 04/29/2024 by Lemon Raisin, MD.  Called Nurse Triage reporting Dysuria.  Symptoms began several days ago.  Interventions attempted: Rest, hydration, or home remedies.  Symptoms are: gradually worsening.  Triage Disposition: See Physician Within 24 Hours  Patient/caregiver understands and will follow disposition?: yes  Copied from CRM #[id number]. Topic: Clinical - Red Word Triage >> Jul 13, 2024  3:42 PM Charlet HERO wrote: Red Word that prompted transfer to Nurse Triage: Patient is calling bc she is stting that she has a uti she was treated for by uriologist and she thinks it is back was told to speak with pcp, pressure and burning when she uses the bathroom.  Did complete all meds she was prescribed does not want to go to urgent care. Dr. Lemon. Reason for Disposition  Age > 50 years  Answer Assessment - Initial Assessment Questions Patient states she does see urology for chronic UTI- but they do not have open appointment and advised PCP- appointment scheduled    1. SEVERITY: How bad is the pain?  (e.g., Scale 1-10; mild, moderate, or severe)     moderate 2. FREQUENCY: How many times have you had painful urination today?      3-4 times 3. PATTERN: Is pain present every time you urinate or just sometimes?      Every time- not intense yet 4. ONSET: When did the painful urination start?      2 days 5. FEVER: Do you have a fever? If Yes, ask: What is your temperature, how was it measured, and when did it start?     no 6. PAST UTI: Have you had a urine infection before? If Yes, ask: When was the last time? and What happened that time?      Yes- sees urology  7. CAUSE: What do you think is causing the painful urination?  (e.g., UTI, scratch, Herpes sore)     Chronic UTI 8. OTHER SYMPTOMS: Do you have any other symptoms? (e.g., blood in  urine, flank pain, genital sores, urgency, vaginal discharge)     Frequency, urgency  Protocols used: Urination Pain - Female-A-AH

## 2024-07-13 NOTE — Telephone Encounter (Signed)
 Noted  Pt has appt.  KP

## 2024-07-14 ENCOUNTER — Other Ambulatory Visit: Payer: Self-pay | Admitting: Student

## 2024-07-14 ENCOUNTER — Ambulatory Visit: Admitting: Student

## 2024-07-14 ENCOUNTER — Other Ambulatory Visit (HOSPITAL_COMMUNITY)
Admission: RE | Admit: 2024-07-14 | Discharge: 2024-07-14 | Disposition: A | Discharge status: Home / Self Care | Source: Ambulatory Visit | Attending: Student | Admitting: Student

## 2024-07-14 ENCOUNTER — Encounter: Payer: Self-pay | Admitting: Student

## 2024-07-14 ENCOUNTER — Ambulatory Visit

## 2024-07-14 VITALS — BP 130/78 | HR 66 | Temp 98.3°F | Ht 62.0 in | Wt 192.0 lb

## 2024-07-14 DIAGNOSIS — N3000 Acute cystitis without hematuria: Secondary | ICD-10-CM

## 2024-07-14 DIAGNOSIS — N898 Other specified noninflammatory disorders of vagina: Secondary | ICD-10-CM | POA: Insufficient documentation

## 2024-07-14 DIAGNOSIS — Z23 Encounter for immunization: Secondary | ICD-10-CM

## 2024-07-14 DIAGNOSIS — N952 Postmenopausal atrophic vaginitis: Secondary | ICD-10-CM

## 2024-07-14 DIAGNOSIS — N390 Urinary tract infection, site not specified: Secondary | ICD-10-CM | POA: Insufficient documentation

## 2024-07-14 LAB — CERVICOVAGINAL ANCILLARY ONLY
Bacterial Vaginitis (gardnerella): NEGATIVE
Candida Glabrata: POSITIVE — AB
Candida Vaginitis: POSITIVE — AB
Chlamydia: NEGATIVE
Comment: NEGATIVE
Comment: NEGATIVE
Comment: NEGATIVE
Comment: NEGATIVE
Comment: NEGATIVE
Comment: NORMAL
Neisseria Gonorrhea: NEGATIVE
Trichomonas: NEGATIVE

## 2024-07-14 LAB — POCT URINALYSIS DIPSTICK
Bilirubin, UA: NEGATIVE
Blood, UA: NEGATIVE
Glucose, UA: NEGATIVE
Ketones, UA: NEGATIVE
Leukocytes, UA: NEGATIVE
Nitrite, UA: NEGATIVE
Protein, UA: NEGATIVE
Spec Grav, UA: 1.015
Urobilinogen, UA: 0.2 U/dL — AB
pH, UA: 6

## 2024-07-14 NOTE — Assessment & Plan Note (Signed)
 Recently started on vaginal estrogen by GYN.

## 2024-07-14 NOTE — Progress Notes (Signed)
 "  Established Patient Office Visit  Subjective   Patient ID: Carrie Roberson, female    DOB: 12/22/1945  Age: 78 y.o. MRN: [id number]  Chief Complaint  Patient presents with   Dysuria    Pressure and burning with urination for 2 - 3 days, today is not having symptoms, thinks she may have a vaginal infection     Carrie Roberson is a 78 y.o. person with medical hx listed below who presents today for  vaginal irritation. Has hx of recurrent UTI most recently urine culture positive for citrobacter koseri on 06/25/2024 and treated with doxycycline  but had nausea and given omicef which she was able to complete 7 days with urology. Repeat UA RBCs but otherwise negative. Has burning in the vulva, mild dysuria. She recently started on estrogen cream at gyn on 12/10.  Denies fevers, chills, back pain, abdominal pain, n/v.   Patient Active Problem List   Diagnosis Date Noted   Acute cystitis without hematuria 07/14/2024   Vaginal irritation 07/14/2024   Aortic atherosclerosis 01/30/2024   Primary osteoarthritis of left knee 01/11/2023   Chemotherapy-induced peripheral neuropathy 02/24/2020   Genetic testing 11/02/2019   Thyroid  nodule 10/02/2019   B12 deficiency 09/18/2019   Family history of breast cancer    Family history of leukemia    Goals of care, counseling/discussion 09/07/2019   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) 08/19/2019   History of ovarian cancer    Anxiety 09/02/2017   DDD (degenerative disc disease), 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   FH: diabetes mellitus 09/02/2017   Obesity, Class I, BMI 30.0-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   atorvastatin  (LIPITOR) 20 MG tablet Take 1 tablet (20 mg total) by mouth daily.   diclofenac  (VOLTAREN ) 75 MG EC tablet Take 1 tablet (75 mg total) by mouth 2 (two) times daily.   fexofenadine-pseudoephedrine (ALLEGRA-D 24) 180-240 MG 24 hr tablet Take 1 tablet by mouth daily.   hydrOXYzine (ATARAX) 25 MG tablet Take 25 mg by mouth at bedtime.   loratadine (CLARITIN) 10 MG tablet Take 10 mg by mouth daily. otc   omeprazole (PRILOSEC) 10 MG capsule Take 10 mg by mouth daily.   ondansetron  (ZOFRAN -ODT) 4 MG disintegrating tablet Take 1 tablet (4 mg total) by mouth every 8 (eight) hours as needed for nausea or vomiting.   Polyethylene Glycol 3350  (MIRALAX  PO) Take by mouth.   pregabalin  (LYRICA ) 75 MG capsule TAKE 1 CAPSULE BY MOUTH TWICE A DAY   Probiotic Product (UP4 PROBIOTICS WOMENS PO) Take 1 tablet by mouth daily.   triamcinolone  (KENALOG ) 0.025 % cream Apply topically 2 (two) times daily.   XYZAL ALLERGY 24HR 5 MG tablet Take 10 mg by mouth daily.   No facility-administered encounter medications on file as of 07/14/2024.    BP 130/78   Pulse 66   Temp 98.3 F (36.8 C) (Oral)   Ht 5' 2 (1.575 m)   Wt 192 lb (87.1 kg)   SpO2 98%   BMI 35.12 kg/m  BP Readings from Last 3 Encounters:  07/14/24 130/78  06/25/24 (!) 179/98  06/11/24 (!) 154/76  Physical Exam Constitutional:      Appearance: Normal appearance.  HENT:     Mouth/Throat:     Mouth: Mucous membranes are moist.     Pharynx: Oropharynx is clear.  Cardiovascular:     Rate and Rhythm: Normal rate and regular rhythm.  Pulmonary:     Effort: Pulmonary effort is normal.     Breath sounds: No rhonchi or rales.  Abdominal:     General: Abdomen is flat. Bowel sounds are normal. There is no distension.     Palpations: Abdomen is soft.     Tenderness: There is no abdominal tenderness.  Musculoskeletal:        General: Normal range of motion.     Right lower leg: No edema.     Left  lower leg: No edema.  Skin:    General: Skin is warm and dry.     Capillary Refill: Capillary refill takes less than 2 seconds.  Neurological:     General: No focal deficit present.     Mental Status: She is alert and oriented to person, place, and time.  Psychiatric:        Mood and Affect: Mood normal.        Behavior: Behavior normal.        04/29/2024    2:37 PM 04/03/2024    1:11 PM 01/29/2024    3:55 PM  Depression screen PHQ 2/9  Decreased Interest 0 0 1  Down, Depressed, Hopeless 0 0 0  PHQ - 2 Score 0 0 1  Altered sleeping  0 2  Tired, decreased energy  0 0  Change in appetite  0 1  Feeling bad or failure about yourself   0 0  Trouble concentrating  0 0  Moving slowly or fidgety/restless  0 0  Suicidal thoughts  0 0  PHQ-9 Score  0  4   Difficult doing work/chores   Not difficult at all     Data saved with a previous flowsheet row definition       04/29/2024    2:37 PM 01/29/2024    3:55 PM 11/19/2023    1:05 PM 08/05/2023   12:48 PM  GAD 7 : Generalized Anxiety Score  Nervous, Anxious, on Edge 0 2    Control/stop worrying 0 0    Worry too much - different things  0    Trouble relaxing  1    Restless  1    Easily annoyed or irritable  0    Afraid - awful might happen  0    Total GAD 7 Score  4    Anxiety Difficulty  Not difficult at all       Information is confidential and restricted. Go to Review Flowsheets to unlock data.    Results for orders placed or performed in visit on 07/14/24  POCT Urinalysis Dipstick  Result Value Ref Range   Color, UA amber    Clarity, UA cloudy    Glucose, UA Negative Negative   Bilirubin, UA negative    Ketones, UA negative    Spec Grav, UA 1.015 1.010 - 1.025   Blood, UA negative    pH, UA 6.0 5.0 - 8.0   Protein, UA Negative Negative   Urobilinogen, UA 0.2 (A) 0.2 or 1.0 E.U./dL   Nitrite, UA negative    Leukocytes, UA Negative Negative   Appearance     Odor      Last CBC Lab Results  Component Value Date  WBC 3.0 (L) 04/03/2024   HGB 14.8 04/03/2024   HCT 43.7 04/03/2024   MCV 89.2 04/03/2024   MCH 30.2 04/03/2024   RDW 13.1 04/03/2024   PLT 226 04/03/2024   Last metabolic panel Lab Results  Component Value Date   GLUCOSE 138 (H) 12/15/2023   NA 137 12/15/2023   K 3.7 12/15/2023   CL 107 12/15/2023   CO2 24 12/15/2023   BUN 12 12/15/2023   CREATININE 0.76 12/15/2023   GFRNONAA >60 12/15/2023   CALCIUM  10.3 12/15/2023   PHOS 3.2 07/28/2021   PROT 6.2 (L) 12/15/2023   ALBUMIN 3.7 12/15/2023   BILITOT 0.7 12/15/2023   ALKPHOS 80 12/15/2023   AST 21 12/15/2023   ALT 18 12/15/2023   ANIONGAP 6 12/15/2023   Last lipids Lab Results  Component Value Date   CHOL 188 04/29/2024   HDL 54 04/29/2024   LDLCALC 114 (H) 04/29/2024   TRIG 109 04/29/2024   CHOLHDL 3.5 04/29/2024   Last hemoglobin A1c No results found for: HGBA1C    The ASCVD Risk score (Arnett DK, et al., 2019) failed to calculate for the following reasons:   The 2019 ASCVD risk score is only valid for ages 48 to 16   * - Cholesterol units were assumed    Assessment & Plan:  Acute cystitis without hematuria Assessment & Plan: Return of urinary symptoms in the last 3 days. Mostly recently treated for  citrobacter koseri UTI with omnicef  on 12/4. Urine dipstick unremarkable today. Will send for UA and culture.   Orders: -     Urinalysis -     POCT urinalysis dipstick -     Urine Culture  Vaginal irritation Assessment & Plan: Symptoms started for 3 days, is concerned for vaginal infection due to multiple courses of antibiotics lately for UTI. Vaginal swab ordered. Possible irration due to atrophic vaginitis as well and recently started on topical estrogen therapy.   Orders: -     Cervicovaginal ancillary only  Encounter for immunization -     Flu vaccine HIGH DOSE PF(Fluzone Trivalent)  Atrophic vaginitis Assessment & Plan: Recently started on vaginal estrogen by GYN.       Harlene Saddler, MD "

## 2024-07-14 NOTE — Assessment & Plan Note (Signed)
 Return of urinary symptoms in the last 3 days. Mostly recently treated for  citrobacter koseri UTI with omnicef  on 12/4. Urine dipstick unremarkable today. Will send for UA and culture.

## 2024-07-14 NOTE — Assessment & Plan Note (Signed)
 Symptoms started for 3 days, is concerned for vaginal infection due to multiple courses of antibiotics lately for UTI. Vaginal swab ordered. Possible irration due to atrophic vaginitis as well and recently started on topical estrogen therapy.

## 2024-07-15 ENCOUNTER — Other Ambulatory Visit: Payer: Self-pay

## 2024-07-15 ENCOUNTER — Ambulatory Visit: Payer: Self-pay | Admitting: Student

## 2024-07-15 DIAGNOSIS — B3731 Acute candidiasis of vulva and vagina: Secondary | ICD-10-CM

## 2024-07-15 LAB — URINALYSIS
Bilirubin, UA: NEGATIVE
Glucose, UA: NEGATIVE
Ketones, UA: NEGATIVE
Nitrite, UA: NEGATIVE
Protein,UA: NEGATIVE
RBC, UA: NEGATIVE
Specific Gravity, UA: 1.016 (ref 1.005–1.030)
Urobilinogen, Ur: 0.2 mg/dL (ref 0.2–1.0)
pH, UA: 5.5 (ref 5.0–7.5)

## 2024-07-15 LAB — SPECIMEN STATUS REPORT

## 2024-07-15 MED ORDER — FLUCONAZOLE 150 MG PO TABS: ORAL_TABLET | ORAL | 0 refills | Status: AC

## 2024-07-15 NOTE — Progress Notes (Signed)
 Spoke with patient she verbalized understanding. Sent medication in as well and explained to patient how to take. She verbalized understanding.  JM

## 2024-07-18 LAB — URINE CULTURE

## 2024-07-22 ENCOUNTER — Ambulatory Visit (INDEPENDENT_AMBULATORY_CARE_PROVIDER_SITE_OTHER): Admitting: Physician Assistant

## 2024-07-22 ENCOUNTER — Ambulatory Visit: Payer: Self-pay | Admitting: Student

## 2024-07-22 ENCOUNTER — Encounter: Payer: Self-pay | Admitting: Physician Assistant

## 2024-07-22 VITALS — BP 144/80 | HR 78 | Ht 62.0 in | Wt 187.0 lb

## 2024-07-22 DIAGNOSIS — N390 Urinary tract infection, site not specified: Secondary | ICD-10-CM | POA: Diagnosis not present

## 2024-07-22 LAB — MICROSCOPIC EXAMINATION: Epithelial Cells (non renal): >10 /HPF — AB (ref 0–10)

## 2024-07-22 LAB — URINALYSIS, COMPLETE
Bilirubin, UA: NEGATIVE
Glucose, UA: NEGATIVE
Ketones, UA: NEGATIVE
Leukocytes,UA: NEGATIVE
Nitrite, UA: NEGATIVE
Protein,UA: NEGATIVE
Specific Gravity, UA: 1.03 (ref 1.005–1.030)
Urobilinogen, Ur: 0.2 mg/dL (ref 0.2–1.0)
pH, UA: 6 (ref 5.0–7.5)

## 2024-07-22 MED ORDER — NITROFURANTOIN MONOHYD MACRO 100 MG PO CAPS: 100.0000 mg | ORAL_CAPSULE | Freq: Every day | ORAL | 5 refills | Status: AC

## 2024-07-22 NOTE — Progress Notes (Signed)
 "  07/22/2024 2:37 PM   Carrie Roberson Jun 25, 1946 [id number]  CC: Chief Complaint  Patient presents with   Recurrent UTI   HPI: Carrie Roberson is a 78 y.o. 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 difficile colitis, OAB wet with mixed incontinence who failed Myrbetriq  due to cost, recurrent UTI previously on suppressive Macrobid , and microscopic hematuria with benign workup in 2025 who presents today to discuss recurrent UTI.   Today she reports she was having some vaginal pressure when she initially scheduled the appointment, however she subsequently followed up with her PCP and realized that it was vaginal in etiology.  Otherwise she is feeling fine today with no concerns.  She has been thinking about it and would like to resume suppressive antibiotic therapy.  In-office UA today positive for 1+ blood; urine microscopy with >10 epithelial cells/hpf, and many bacteria.  PMH: Past Medical History:  Diagnosis Date   Anxiety    Arthritis    Diverticulitis    Family history of breast cancer    Family history of leukemia    GERD (gastroesophageal reflux disease)    History of ischemic colitis 09/02/2017   Hx of colonic polyp    Hypertension    H/O-PT TAKEN OFF BY PCP AS OF 2019   IBS (irritable bowel syndrome)    Ovarian cancer (HCC)    Ovarian cyst    Personal history of chemotherapy 2021   6 treatments   Vaginal atrophy     Surgical History: Past Surgical History:  Procedure Laterality Date   ABDOMINAL HYSTERECTOMY     BREAST BIOPSY Left    needle bx/clip-neg   BREAST BIOPSY Right 06/28/2021   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   CERVICAL BIOPSY  W/ LOOP ELECTRODE EXCISION     CHOLECYSTECTOMY     Colonoscopoy  2007, 2008, 2011   COLONOSCOPY WITH PROPOFOL  N/A 05/05/2018   Procedure: COLONOSCOPY WITH PROPOFOL ;  Surgeon: Unk Corinn Skiff, MD;  Location: [facility]  ENDOSCOPY;  Service: Gastroenterology;  Laterality: N/A;   FLEXIBLE SIGMOIDOSCOPY N/A 07/20/2021   Procedure: FLEXIBLE SIGMOIDOSCOPY;  Surgeon: Maryruth Ole DASEN, MD;  Location: [facility] ENDOSCOPY;  Service: Endoscopy;  Laterality: N/A;   HYSTERECTOMY ABDOMINAL WITH SALPINGO-OOPHORECTOMY N/A 08/19/2019   Procedure: HYSTERECTOMY ABDOMINAL WITH SALPINGO-OOPHORECTOMY WITH PELVIC WASHINGS PERITONEAL BIOPSIES AND PARA AORTIC LYMPH NODE DISSECTION;  Surgeon: Lake Read, MD;  Location: [facility] ORS;  Service: Gynecology;  Laterality: N/A;   LEEP     OMENTECTOMY N/A 08/19/2019   Procedure: OMENTECTOMY;  Surgeon: Lake Read, MD;  Location: [facility] ORS;  Service: Gynecology;  Laterality: N/A;   OOPHORECTOMY     PORTA CATH INSERTION N/A 09/11/2019   Procedure: PORTA CATH INSERTION;  Surgeon: Marea Selinda RAMAN, MD;  Location: [facility] INVASIVE CV LAB;  Service: Cardiovascular;  Laterality: N/A;   PORTA CATH REMOVAL N/A 08/16/2022   Procedure: PORTA CATH REMOVAL;  Surgeon: Marea Selinda RAMAN, MD;  Location: [facility] INVASIVE CV LAB;  Service: Cardiovascular;  Laterality: N/A;   TUBAL LIGATION  1999    Home Medications:  Allergies as of 07/22/2024       Reactions   Citalopram  Nausea And Vomiting   Doxycycline  Nausea Only   Ivp Dye [iodinated Contrast Media] Hives, Swelling   hives   Meloxicam    unknown   Adhesive [tape] Rash   Amlodipine Rash   Ampicillin Rash   Did it involve swelling of the face/tongue/throat, SOB,  or low BP? No Did it involve sudden or severe rash/hives, skin peeling, or any reaction on the inside of your mouth or nose? Yes Did you need to seek medical attention at a hospital or doctor's office? Yes When did it last happen?       10 + years If all above answers are NO, may proceed with cephalosporin use.   Cefoxitin Rash   Chlorhexidine Rash   Chloraprep Scrub Teal   Estradiol Rash   Estropipate Rash   Olmesartan Medoxomil-hctz Rash   Sertraline Rash      Sulfa Antibiotics Rash    Xolair [omalizumab] Rash        Medication List        Accurate as of July 22, 2024  2:37 PM. If you have any questions, ask your nurse or doctor.          acetaminophen  500 MG tablet Commonly known as: TYLENOL  Take 1,000 mg by mouth every 6 (six) hours as needed (for pain.).   atorvastatin  20 MG tablet Commonly known as: LIPITOR Take 1 tablet (20 mg total) by mouth daily.   diclofenac  75 MG EC tablet Commonly known as: VOLTAREN  Take 1 tablet (75 mg total) by mouth 2 (two) times daily.   estradiol 0.01 % Crea vaginal cream Commonly known as: ESTRACE Place 1 g vaginally.   fexofenadine-pseudoephedrine 180-240 MG 24 hr tablet Commonly known as: ALLEGRA-D 24 Take 1 tablet by mouth daily.   fluconazole  150 MG tablet Commonly known as: Diflucan  Take one now, then one 3 days later   hydrOXYzine 25 MG tablet Commonly known as: ATARAX Take 25 mg by mouth at bedtime.   loratadine 10 MG tablet Commonly known as: CLARITIN Take 10 mg by mouth daily. otc   MIRALAX  PO Take by mouth.   nitrofurantoin  (macrocrystal-monohydrate) 100 MG capsule Commonly known as: MACROBID  Take 1 capsule (100 mg total) by mouth daily. Started by: Darell Saputo   omeprazole 10 MG capsule Commonly known as: PRILOSEC Take 10 mg by mouth daily.   ondansetron  4 MG disintegrating tablet Commonly known as: ZOFRAN -ODT Take 1 tablet (4 mg total) by mouth every 8 (eight) hours as needed for nausea or vomiting.   pregabalin  75 MG capsule Commonly known as: LYRICA  TAKE 1 CAPSULE BY MOUTH TWICE A DAY   triamcinolone  0.025 % cream Commonly known as: KENALOG  Apply topically 2 (two) times daily.   UP4 PROBIOTICS WOMENS PO Take 1 tablet by mouth daily.   Xyzal Allergy 24HR 5 MG tablet Generic drug: levocetirizine Take 10 mg by mouth daily.        Allergies:  Allergies[1]  Family History: Family History  Problem Relation Age of Onset   Heart failure Mother    Atrial  fibrillation Mother    Hypertension Mother    Diabetes Father    Heart attack Father    Breast cancer Maternal Aunt 51   Breast cancer Maternal Grandmother 18   Breast cancer Cousin        2 mat cousins   Hypertension Sister    Diabetes Sister    Blindness Sister    Hypertension Sister    Arthritis Sister    Leukemia Paternal Aunt    Leukemia Paternal Uncle    Brain cancer Paternal Aunt    Other Brother 13       Drowning accident   Liver cancer Maternal Uncle    Alcohol abuse Maternal Uncle    Prostate cancer Neg Hx  Kidney cancer Neg Hx    BRCA 1/2 Neg Hx     Social History:   reports that she has never smoked. She has never been exposed to tobacco smoke. She has never used smokeless tobacco. She reports that she does not drink alcohol and does not use drugs.  Physical Exam: BP (!) 144/80   Pulse 78   Ht 5' 2 (1.575 m)   Wt 187 lb (84.8 kg)   BMI 34.20 kg/m   Constitutional:  Alert and oriented, no acute distress, nontoxic appearing HEENT: [place], AT Cardiovascular: No clubbing, cyanosis, or edema Respiratory: Normal respiratory effort, no increased work of breathing Skin: No rashes, bruises or suspicious lesions Neurologic: Grossly intact, no focal deficits, moving all 4 extremities Psychiatric: Normal mood and affect  Assessment & Plan:   1. Recurrent UTI (Primary) UA today contaminated, otherwise bland.  Not clinically infected.  Reasonable to resume suppressive antibiotics.  Using shared decision making, we elected to start with 6 months of suppressive Macrobid .  She is scheduled to follow-up with me in May.  Will discuss whether or not to continue longer at that point. - Urinalysis, Complete - nitrofurantoin , macrocrystal-monohydrate, (MACROBID ) 100 MG capsule; Take 1 capsule (100 mg total) by mouth daily.  Dispense: 30 capsule; Refill: 5  Return for Keep follow-up as scheduled.  Lucie Hones, PA-C  [facility] [place] [address] [place], [place] [address] [phone]     [1]  Allergies Allergen Reactions   Citalopram  Nausea And Vomiting   Doxycycline  Nausea Only   Ivp Dye [Iodinated Contrast Media] Hives and Swelling    hives   Meloxicam     unknown   Adhesive [Tape] Rash   Amlodipine Rash   Ampicillin Rash    Did it involve swelling of the face/tongue/throat, SOB, or low BP? No Did it involve sudden or severe rash/hives, skin peeling, or any reaction on the inside of your mouth or nose? Yes Did you need to seek medical attention at a hospital or doctor's office? Yes When did it last happen?       10 + years If all above answers are NO, may proceed with cephalosporin use.   Cefoxitin Rash   Chlorhexidine Rash    Chloraprep Scrub Teal   Estradiol Rash   Estropipate Rash   Olmesartan Medoxomil-Hctz Rash   Sertraline Rash        Sulfa Antibiotics Rash   Xolair [Omalizumab] Rash   "

## 2024-07-24 ENCOUNTER — Ambulatory Visit: Admitting: Student

## 2024-07-24 NOTE — Telephone Encounter (Addendum)
 Patient prescribed medication at her Urology appointment on 07/22/2024.

## 2024-07-25 ENCOUNTER — Other Ambulatory Visit: Payer: Self-pay | Admitting: Nurse Practitioner

## 2024-08-10 ENCOUNTER — Ambulatory Visit
Admission: RE | Admit: 2024-08-10 | Discharge: 2024-08-10 | Disposition: A | Source: Ambulatory Visit | Attending: Obstetrics and Gynecology | Admitting: Obstetrics and Gynecology

## 2024-08-10 DIAGNOSIS — Z1231 Encounter for screening mammogram for malignant neoplasm of breast: Secondary | ICD-10-CM | POA: Diagnosis present

## 2024-10-01 ENCOUNTER — Other Ambulatory Visit

## 2024-12-09 ENCOUNTER — Ambulatory Visit: Admitting: Physician Assistant

## 2025-02-24 ENCOUNTER — Other Ambulatory Visit

## 2025-02-24 ENCOUNTER — Ambulatory Visit

## 2025-04-02 ENCOUNTER — Other Ambulatory Visit

## 2025-04-02 ENCOUNTER — Ambulatory Visit: Admitting: Oncology
